# Patient Record
Sex: Male | Born: 1945 | Race: Black or African American | Hispanic: No | Marital: Single | Smoking: Current some day smoker
Health system: Southern US, Community
[De-identification: ages and names within clinical notes are randomized; demographics above are authoritative.]

## PROBLEM LIST (undated history)

## (undated) DIAGNOSIS — I10 Essential (primary) hypertension: Secondary | ICD-10-CM

## (undated) DIAGNOSIS — M65351 Trigger finger, right little finger: Secondary | ICD-10-CM

## (undated) DIAGNOSIS — I82409 Acute embolism and thrombosis of unspecified deep veins of unspecified lower extremity: Secondary | ICD-10-CM

## (undated) DIAGNOSIS — IMO0002 Reserved for concepts with insufficient information to code with codable children: Secondary | ICD-10-CM

## (undated) DIAGNOSIS — I872 Venous insufficiency (chronic) (peripheral): Secondary | ICD-10-CM

## (undated) DIAGNOSIS — K648 Other hemorrhoids: Secondary | ICD-10-CM

## (undated) DIAGNOSIS — K219 Gastro-esophageal reflux disease without esophagitis: Secondary | ICD-10-CM

## (undated) DIAGNOSIS — M1990 Unspecified osteoarthritis, unspecified site: Secondary | ICD-10-CM

## (undated) DIAGNOSIS — T402X5A Adverse effect of other opioids, initial encounter: Secondary | ICD-10-CM

## (undated) DIAGNOSIS — I350 Nonrheumatic aortic (valve) stenosis: Secondary | ICD-10-CM

## (undated) DIAGNOSIS — K220 Achalasia of cardia: Secondary | ICD-10-CM

## (undated) DIAGNOSIS — G629 Polyneuropathy, unspecified: Secondary | ICD-10-CM

## (undated) DIAGNOSIS — H269 Unspecified cataract: Secondary | ICD-10-CM

## (undated) DIAGNOSIS — B351 Tinea unguium: Secondary | ICD-10-CM

## (undated) DIAGNOSIS — K922 Gastrointestinal hemorrhage, unspecified: Secondary | ICD-10-CM

## (undated) DIAGNOSIS — E538 Deficiency of other specified B group vitamins: Secondary | ICD-10-CM

## (undated) DIAGNOSIS — D649 Anemia, unspecified: Secondary | ICD-10-CM

## (undated) DIAGNOSIS — K5790 Diverticulosis of intestine, part unspecified, without perforation or abscess without bleeding: Secondary | ICD-10-CM

## (undated) DIAGNOSIS — M48062 Spinal stenosis, lumbar region with neurogenic claudication: Secondary | ICD-10-CM

## (undated) DIAGNOSIS — K5903 Drug induced constipation: Secondary | ICD-10-CM

## (undated) DIAGNOSIS — G709 Myoneural disorder, unspecified: Secondary | ICD-10-CM

## (undated) DIAGNOSIS — Z800 Family history of malignant neoplasm of digestive organs: Secondary | ICD-10-CM

## (undated) DIAGNOSIS — K5520 Angiodysplasia of colon without hemorrhage: Secondary | ICD-10-CM

## (undated) DIAGNOSIS — I119 Hypertensive heart disease without heart failure: Secondary | ICD-10-CM

## (undated) DIAGNOSIS — H33001 Unspecified retinal detachment with retinal break, right eye: Secondary | ICD-10-CM

## (undated) DIAGNOSIS — E669 Obesity, unspecified: Secondary | ICD-10-CM

## (undated) HISTORY — DX: Nonrheumatic aortic (valve) stenosis: I35.0

## (undated) HISTORY — DX: Essential (primary) hypertension: I10

## (undated) HISTORY — DX: Trigger finger, right little finger: M65.351

## (undated) HISTORY — DX: Gastro-esophageal reflux disease without esophagitis: K21.9

## (undated) HISTORY — DX: Venous insufficiency (chronic) (peripheral): I87.2

## (undated) HISTORY — DX: Unspecified osteoarthritis, unspecified site: M19.90

## (undated) HISTORY — PX: OTHER SURGICAL HISTORY: SHX169

## (undated) HISTORY — DX: Angiodysplasia of colon without hemorrhage: K55.20

## (undated) HISTORY — DX: Drug induced constipation: K59.03

## (undated) HISTORY — DX: Obesity, unspecified: E66.9

## (undated) HISTORY — DX: Adverse effect of other opioids, initial encounter: T40.2X5A

## (undated) HISTORY — DX: Unspecified cataract: H26.9

## (undated) HISTORY — DX: Acute embolism and thrombosis of unspecified deep veins of unspecified lower extremity: I82.409

## (undated) HISTORY — DX: Family history of malignant neoplasm of digestive organs: Z80.0

## (undated) HISTORY — DX: Unspecified retinal detachment with retinal break, right eye: H33.001

## (undated) HISTORY — PX: FRACTURE SURGERY: SHX138

## (undated) HISTORY — DX: Other hemorrhoids: K64.8

## (undated) HISTORY — PX: EYE SURGERY: SHX253

## (undated) HISTORY — DX: Hypertensive heart disease without heart failure: I11.9

## (undated) HISTORY — DX: Spinal stenosis, lumbar region with neurogenic claudication: M48.062

## (undated) HISTORY — DX: Achalasia of cardia: K22.0

## (undated) HISTORY — DX: Deficiency of other specified B group vitamins: E53.8

## (undated) HISTORY — DX: Reserved for concepts with insufficient information to code with codable children: IMO0002

## (undated) HISTORY — DX: Diverticulosis of intestine, part unspecified, without perforation or abscess without bleeding: K57.90

## (undated) HISTORY — DX: Tinea unguium: B35.1

---

## 1997-11-30 ENCOUNTER — Emergency Department (HOSPITAL_COMMUNITY): Admission: EM | Admit: 1997-11-30 | Discharge: 1997-11-30 | Payer: Self-pay | Admitting: Emergency Medicine

## 1997-12-15 ENCOUNTER — Emergency Department (HOSPITAL_COMMUNITY): Admission: EM | Admit: 1997-12-15 | Discharge: 1997-12-15 | Payer: Self-pay | Admitting: Emergency Medicine

## 1998-10-11 ENCOUNTER — Emergency Department (HOSPITAL_COMMUNITY): Admission: EM | Admit: 1998-10-11 | Discharge: 1998-10-11 | Payer: Self-pay | Admitting: Emergency Medicine

## 1998-10-11 ENCOUNTER — Encounter: Payer: Self-pay | Admitting: Emergency Medicine

## 1999-04-18 ENCOUNTER — Encounter: Payer: Self-pay | Admitting: Emergency Medicine

## 1999-04-18 ENCOUNTER — Inpatient Hospital Stay (HOSPITAL_COMMUNITY): Admission: EM | Admit: 1999-04-18 | Discharge: 1999-04-21 | Payer: Self-pay | Admitting: Emergency Medicine

## 1999-04-19 ENCOUNTER — Encounter: Payer: Self-pay | Admitting: Internal Medicine

## 1999-05-05 ENCOUNTER — Encounter: Admission: RE | Admit: 1999-05-05 | Discharge: 1999-05-05 | Payer: Self-pay | Admitting: Internal Medicine

## 1999-06-02 ENCOUNTER — Encounter: Admission: RE | Admit: 1999-06-02 | Discharge: 1999-06-02 | Payer: Self-pay | Admitting: Hematology and Oncology

## 1999-11-17 ENCOUNTER — Encounter: Admission: RE | Admit: 1999-11-17 | Discharge: 1999-11-17 | Payer: Self-pay | Admitting: Internal Medicine

## 2000-04-05 ENCOUNTER — Emergency Department (HOSPITAL_COMMUNITY): Admission: EM | Admit: 2000-04-05 | Discharge: 2000-04-05 | Payer: Self-pay | Admitting: Emergency Medicine

## 2000-04-05 ENCOUNTER — Encounter: Payer: Self-pay | Admitting: Emergency Medicine

## 2000-07-10 ENCOUNTER — Emergency Department (HOSPITAL_COMMUNITY): Admission: EM | Admit: 2000-07-10 | Discharge: 2000-07-10 | Payer: Self-pay | Admitting: Emergency Medicine

## 2000-07-10 ENCOUNTER — Encounter: Payer: Self-pay | Admitting: Emergency Medicine

## 2000-10-29 ENCOUNTER — Emergency Department (HOSPITAL_COMMUNITY): Admission: EM | Admit: 2000-10-29 | Discharge: 2000-10-29 | Payer: Self-pay | Admitting: Emergency Medicine

## 2000-10-29 ENCOUNTER — Encounter: Payer: Self-pay | Admitting: Emergency Medicine

## 2001-10-03 ENCOUNTER — Inpatient Hospital Stay (HOSPITAL_COMMUNITY): Admission: EM | Admit: 2001-10-03 | Discharge: 2001-10-08 | Payer: Self-pay

## 2001-10-03 ENCOUNTER — Encounter: Payer: Self-pay | Admitting: Internal Medicine

## 2001-10-05 ENCOUNTER — Encounter: Payer: Self-pay | Admitting: Internal Medicine

## 2001-10-14 ENCOUNTER — Encounter: Admission: RE | Admit: 2001-10-14 | Discharge: 2001-10-14 | Payer: Self-pay | Admitting: Internal Medicine

## 2002-10-23 ENCOUNTER — Ambulatory Visit (HOSPITAL_COMMUNITY): Admission: RE | Admit: 2002-10-23 | Discharge: 2002-10-23 | Payer: Self-pay | Admitting: Internal Medicine

## 2002-10-23 ENCOUNTER — Encounter: Admission: RE | Admit: 2002-10-23 | Discharge: 2002-10-23 | Payer: Self-pay | Admitting: Internal Medicine

## 2002-10-23 ENCOUNTER — Encounter: Payer: Self-pay | Admitting: Internal Medicine

## 2002-11-05 ENCOUNTER — Encounter: Payer: Self-pay | Admitting: Cardiology

## 2002-11-05 ENCOUNTER — Ambulatory Visit (HOSPITAL_COMMUNITY): Admission: RE | Admit: 2002-11-05 | Discharge: 2002-11-05 | Payer: Self-pay | Admitting: Internal Medicine

## 2002-11-13 ENCOUNTER — Encounter: Admission: RE | Admit: 2002-11-13 | Discharge: 2002-11-13 | Payer: Self-pay | Admitting: Internal Medicine

## 2003-02-09 ENCOUNTER — Encounter: Admission: RE | Admit: 2003-02-09 | Discharge: 2003-02-09 | Payer: Self-pay | Admitting: Internal Medicine

## 2003-06-30 ENCOUNTER — Encounter: Admission: RE | Admit: 2003-06-30 | Discharge: 2003-06-30 | Payer: Self-pay | Admitting: Internal Medicine

## 2003-08-10 ENCOUNTER — Encounter: Admission: RE | Admit: 2003-08-10 | Discharge: 2003-08-10 | Payer: Self-pay | Admitting: Internal Medicine

## 2004-03-14 ENCOUNTER — Emergency Department (HOSPITAL_COMMUNITY): Admission: EM | Admit: 2004-03-14 | Discharge: 2004-03-14 | Payer: Self-pay | Admitting: Family Medicine

## 2004-06-28 ENCOUNTER — Ambulatory Visit: Payer: Self-pay | Admitting: Internal Medicine

## 2004-08-04 ENCOUNTER — Ambulatory Visit: Payer: Self-pay | Admitting: Internal Medicine

## 2004-08-15 ENCOUNTER — Encounter: Admission: RE | Admit: 2004-08-15 | Discharge: 2004-10-20 | Payer: Self-pay | Admitting: Internal Medicine

## 2004-08-15 ENCOUNTER — Ambulatory Visit: Payer: Self-pay | Admitting: Internal Medicine

## 2004-08-29 ENCOUNTER — Ambulatory Visit: Payer: Self-pay | Admitting: Internal Medicine

## 2004-09-16 ENCOUNTER — Ambulatory Visit: Payer: Self-pay | Admitting: Internal Medicine

## 2004-12-06 ENCOUNTER — Ambulatory Visit: Payer: Self-pay | Admitting: Internal Medicine

## 2004-12-10 ENCOUNTER — Ambulatory Visit (HOSPITAL_COMMUNITY): Admission: RE | Admit: 2004-12-10 | Discharge: 2004-12-10 | Payer: Self-pay | Admitting: Internal Medicine

## 2006-02-27 ENCOUNTER — Ambulatory Visit: Payer: Self-pay | Admitting: Hospitalist

## 2006-03-12 ENCOUNTER — Ambulatory Visit: Payer: Self-pay | Admitting: Hospitalist

## 2006-03-13 ENCOUNTER — Ambulatory Visit (HOSPITAL_COMMUNITY): Admission: RE | Admit: 2006-03-13 | Discharge: 2006-03-13 | Payer: Self-pay | Admitting: Hospitalist

## 2006-03-13 ENCOUNTER — Encounter (INDEPENDENT_AMBULATORY_CARE_PROVIDER_SITE_OTHER): Payer: Self-pay | Admitting: Cardiology

## 2006-03-14 ENCOUNTER — Ambulatory Visit: Payer: Self-pay | Admitting: Hospitalist

## 2006-06-15 DIAGNOSIS — I10 Essential (primary) hypertension: Secondary | ICD-10-CM | POA: Insufficient documentation

## 2006-06-15 HISTORY — DX: Essential (primary) hypertension: I10

## 2006-06-16 DIAGNOSIS — M48062 Spinal stenosis, lumbar region with neurogenic claudication: Secondary | ICD-10-CM

## 2006-06-16 HISTORY — DX: Spinal stenosis, lumbar region with neurogenic claudication: M48.062

## 2006-10-09 ENCOUNTER — Encounter (INDEPENDENT_AMBULATORY_CARE_PROVIDER_SITE_OTHER): Payer: Self-pay | Admitting: Internal Medicine

## 2006-10-09 ENCOUNTER — Ambulatory Visit: Payer: Self-pay | Admitting: Hospitalist

## 2006-10-09 DIAGNOSIS — I350 Nonrheumatic aortic (valve) stenosis: Secondary | ICD-10-CM | POA: Insufficient documentation

## 2006-10-09 HISTORY — DX: Nonrheumatic aortic (valve) stenosis: I35.0

## 2006-10-09 LAB — CONVERTED CEMR LAB
ALT: 23 units/L (ref 0–53)
AST: 24 units/L (ref 0–37)
Alkaline Phosphatase: 50 units/L (ref 39–117)
CO2: 27 meq/L (ref 19–32)
Creatinine, Ser: 0.89 mg/dL (ref 0.40–1.50)
Sodium: 139 meq/L (ref 135–145)
Total Bilirubin: 0.7 mg/dL (ref 0.3–1.2)

## 2006-10-11 ENCOUNTER — Ambulatory Visit: Payer: Self-pay | Admitting: Cardiovascular Disease

## 2006-10-16 ENCOUNTER — Ambulatory Visit: Payer: Self-pay | Admitting: Hospitalist

## 2006-10-16 ENCOUNTER — Encounter (INDEPENDENT_AMBULATORY_CARE_PROVIDER_SITE_OTHER): Payer: Self-pay | Admitting: Internal Medicine

## 2006-10-16 LAB — CONVERTED CEMR LAB
BUN: 13 mg/dL
CO2: 30 meq/L
Calcium: 9.8 mg/dL
Chloride: 97 meq/L
Creatinine, Ser: 1.13 mg/dL
Glucose, Bld: 98 mg/dL
Potassium: 3.8 meq/L
Sodium: 135 meq/L

## 2006-10-22 ENCOUNTER — Ambulatory Visit: Payer: Self-pay

## 2006-10-22 ENCOUNTER — Telehealth (INDEPENDENT_AMBULATORY_CARE_PROVIDER_SITE_OTHER): Payer: Self-pay | Admitting: Internal Medicine

## 2006-12-10 ENCOUNTER — Encounter: Admission: RE | Admit: 2006-12-10 | Discharge: 2006-12-10 | Payer: Self-pay | Admitting: Neurosurgery

## 2006-12-24 ENCOUNTER — Encounter: Admission: RE | Admit: 2006-12-24 | Discharge: 2006-12-24 | Payer: Self-pay | Admitting: Neurosurgery

## 2007-01-09 ENCOUNTER — Inpatient Hospital Stay (HOSPITAL_COMMUNITY): Admission: EM | Admit: 2007-01-09 | Discharge: 2007-01-10 | Payer: Self-pay | Admitting: Emergency Medicine

## 2007-01-09 ENCOUNTER — Ambulatory Visit: Payer: Self-pay | Admitting: Cardiology

## 2007-01-09 ENCOUNTER — Ambulatory Visit: Payer: Self-pay | Admitting: Internal Medicine

## 2007-01-10 ENCOUNTER — Encounter (INDEPENDENT_AMBULATORY_CARE_PROVIDER_SITE_OTHER): Payer: Self-pay | Admitting: Internal Medicine

## 2007-01-21 ENCOUNTER — Telehealth (INDEPENDENT_AMBULATORY_CARE_PROVIDER_SITE_OTHER): Payer: Self-pay | Admitting: Pharmacy Technician

## 2007-02-04 DIAGNOSIS — K219 Gastro-esophageal reflux disease without esophagitis: Secondary | ICD-10-CM

## 2007-02-04 HISTORY — DX: Gastro-esophageal reflux disease without esophagitis: K21.9

## 2007-02-11 ENCOUNTER — Ambulatory Visit: Payer: Self-pay | Admitting: Internal Medicine

## 2007-02-12 ENCOUNTER — Encounter (INDEPENDENT_AMBULATORY_CARE_PROVIDER_SITE_OTHER): Payer: Self-pay | Admitting: Infectious Diseases

## 2007-02-12 LAB — CONVERTED CEMR LAB: Sodium: 142 meq/L (ref 135–145)

## 2007-02-13 DIAGNOSIS — F17200 Nicotine dependence, unspecified, uncomplicated: Secondary | ICD-10-CM

## 2007-04-16 ENCOUNTER — Telehealth (INDEPENDENT_AMBULATORY_CARE_PROVIDER_SITE_OTHER): Payer: Self-pay | Admitting: *Deleted

## 2007-07-01 ENCOUNTER — Encounter (INDEPENDENT_AMBULATORY_CARE_PROVIDER_SITE_OTHER): Payer: Self-pay | Admitting: *Deleted

## 2007-07-01 ENCOUNTER — Ambulatory Visit: Payer: Self-pay | Admitting: Internal Medicine

## 2007-07-02 ENCOUNTER — Encounter (INDEPENDENT_AMBULATORY_CARE_PROVIDER_SITE_OTHER): Payer: Self-pay | Admitting: *Deleted

## 2007-07-02 LAB — CONVERTED CEMR LAB
AST: 27 units/L (ref 0–37)
Alkaline Phosphatase: 57 units/L (ref 39–117)
CO2: 26 meq/L (ref 19–32)
Creatinine, Ser: 1.12 mg/dL (ref 0.40–1.50)
Glucose, Bld: 74 mg/dL (ref 70–99)
LDL Cholesterol: 94 mg/dL (ref 0–99)
Potassium: 4.2 meq/L (ref 3.5–5.3)
Sodium: 143 meq/L (ref 135–145)
Total Bilirubin: 0.6 mg/dL (ref 0.3–1.2)
Total Protein: 7.3 g/dL (ref 6.0–8.3)
VLDL: 35 mg/dL (ref 0–40)

## 2007-08-14 ENCOUNTER — Ambulatory Visit: Payer: Self-pay | Admitting: Internal Medicine

## 2007-08-14 ENCOUNTER — Encounter (INDEPENDENT_AMBULATORY_CARE_PROVIDER_SITE_OTHER): Payer: Self-pay | Admitting: *Deleted

## 2007-09-23 ENCOUNTER — Telehealth: Payer: Self-pay | Admitting: *Deleted

## 2007-11-06 ENCOUNTER — Encounter (INDEPENDENT_AMBULATORY_CARE_PROVIDER_SITE_OTHER): Payer: Self-pay | Admitting: *Deleted

## 2007-11-06 ENCOUNTER — Ambulatory Visit: Payer: Self-pay | Admitting: Internal Medicine

## 2007-11-06 LAB — CONVERTED CEMR LAB
Amphetamine Screen, Ur: NEGATIVE
Barbiturate Quant, Ur: NEGATIVE
Benzodiazepines.: NEGATIVE
Cocaine Metabolites: NEGATIVE
Marijuana Metabolite: NEGATIVE

## 2007-12-05 ENCOUNTER — Ambulatory Visit: Payer: Self-pay | Admitting: Internal Medicine

## 2007-12-05 ENCOUNTER — Ambulatory Visit: Payer: Self-pay | Admitting: Infectious Disease

## 2007-12-05 ENCOUNTER — Encounter (INDEPENDENT_AMBULATORY_CARE_PROVIDER_SITE_OTHER): Payer: Self-pay | Admitting: *Deleted

## 2007-12-05 ENCOUNTER — Encounter (INDEPENDENT_AMBULATORY_CARE_PROVIDER_SITE_OTHER): Payer: Self-pay | Admitting: Internal Medicine

## 2007-12-05 ENCOUNTER — Inpatient Hospital Stay (HOSPITAL_COMMUNITY): Admission: AD | Admit: 2007-12-05 | Discharge: 2007-12-07 | Payer: Self-pay | Admitting: Infectious Disease

## 2007-12-05 LAB — CONVERTED CEMR LAB
BUN: 14 mg/dL (ref 6–23)
CO2: 25 meq/L (ref 19–32)
Microalb, Ur: 4.91 mg/dL — ABNORMAL HIGH (ref 0.00–1.89)

## 2007-12-06 ENCOUNTER — Ambulatory Visit: Payer: Self-pay | Admitting: Vascular Surgery

## 2007-12-06 ENCOUNTER — Encounter: Payer: Self-pay | Admitting: Infectious Disease

## 2007-12-13 ENCOUNTER — Encounter: Payer: Self-pay | Admitting: Infectious Disease

## 2007-12-17 ENCOUNTER — Ambulatory Visit: Payer: Self-pay

## 2007-12-17 ENCOUNTER — Encounter: Payer: Self-pay | Admitting: Cardiovascular Disease

## 2007-12-17 ENCOUNTER — Ambulatory Visit: Payer: Self-pay | Admitting: Cardiovascular Disease

## 2007-12-26 ENCOUNTER — Telehealth: Payer: Self-pay | Admitting: *Deleted

## 2008-01-20 ENCOUNTER — Ambulatory Visit: Payer: Self-pay | Admitting: Internal Medicine

## 2008-01-22 ENCOUNTER — Ambulatory Visit (HOSPITAL_COMMUNITY): Admission: RE | Admit: 2008-01-22 | Discharge: 2008-01-22 | Payer: Self-pay | Admitting: Gastroenterology

## 2008-02-11 ENCOUNTER — Ambulatory Visit (HOSPITAL_COMMUNITY): Admission: RE | Admit: 2008-02-11 | Discharge: 2008-02-11 | Payer: Self-pay | Admitting: Physical Therapy

## 2008-02-26 ENCOUNTER — Ambulatory Visit: Payer: Self-pay | Admitting: Internal Medicine

## 2008-02-27 ENCOUNTER — Telehealth: Payer: Self-pay | Admitting: *Deleted

## 2008-03-10 ENCOUNTER — Encounter: Payer: Self-pay | Admitting: Gastroenterology

## 2008-03-11 ENCOUNTER — Ambulatory Visit: Payer: Self-pay | Admitting: Gastroenterology

## 2008-03-16 ENCOUNTER — Telehealth: Payer: Self-pay | Admitting: Internal Medicine

## 2008-03-24 ENCOUNTER — Encounter: Payer: Self-pay | Admitting: Gastroenterology

## 2008-03-24 ENCOUNTER — Ambulatory Visit: Payer: Self-pay | Admitting: Gastroenterology

## 2008-03-27 ENCOUNTER — Encounter: Payer: Self-pay | Admitting: Gastroenterology

## 2008-05-04 ENCOUNTER — Encounter: Payer: Self-pay | Admitting: Internal Medicine

## 2008-05-04 ENCOUNTER — Ambulatory Visit: Payer: Self-pay | Admitting: *Deleted

## 2008-05-04 DIAGNOSIS — E785 Hyperlipidemia, unspecified: Secondary | ICD-10-CM

## 2008-05-04 LAB — CONVERTED CEMR LAB
ALT: 29 units/L (ref 0–53)
AST: 23 units/L (ref 0–37)
BUN: 11 mg/dL (ref 6–23)
Blood Glucose, Fingerstick: 95
Calcium: 9.6 mg/dL (ref 8.4–10.5)
Chloride: 101 meq/L (ref 96–112)
Cholesterol: 201 mg/dL — ABNORMAL HIGH (ref 0–200)
Glucose, Bld: 94 mg/dL (ref 70–99)
HDL: 69 mg/dL (ref >39)
Hemoglobin: 15.2 g/dL (ref 13.0–17.0)
LDL Cholesterol: 99 mg/dL (ref 0–99)
MCHC: 34.9 g/dL (ref 30.0–36.0)
MCV: 94.4 fL (ref 78.0–100.0)
Platelets: 228 10*3/uL (ref 150–400)
RBC: 4.62 M/uL (ref 4.22–5.81)
RDW: 14.6 % (ref 11.5–15.5)
Sodium: 141 meq/L (ref 135–145)
Total Bilirubin: 0.8 mg/dL (ref 0.3–1.2)

## 2008-05-06 ENCOUNTER — Encounter: Payer: Self-pay | Admitting: Internal Medicine

## 2008-05-12 ENCOUNTER — Ambulatory Visit: Payer: Self-pay | Admitting: Vascular Surgery

## 2008-05-12 ENCOUNTER — Encounter: Payer: Self-pay | Admitting: Internal Medicine

## 2008-05-12 ENCOUNTER — Ambulatory Visit: Admission: RE | Admit: 2008-05-12 | Discharge: 2008-05-12 | Payer: Self-pay | Admitting: Internal Medicine

## 2008-06-04 ENCOUNTER — Telehealth: Payer: Self-pay | Admitting: *Deleted

## 2008-06-05 IMAGING — CR DG CHEST 1V PORT
1 series · 1 of 1 positions shown · non-contrast
Comparison: Report [id number].

Portable semierect AP chest 01/09/2007, [DATE] hours.
INDICATION: Chest pain.

[view not recorded]
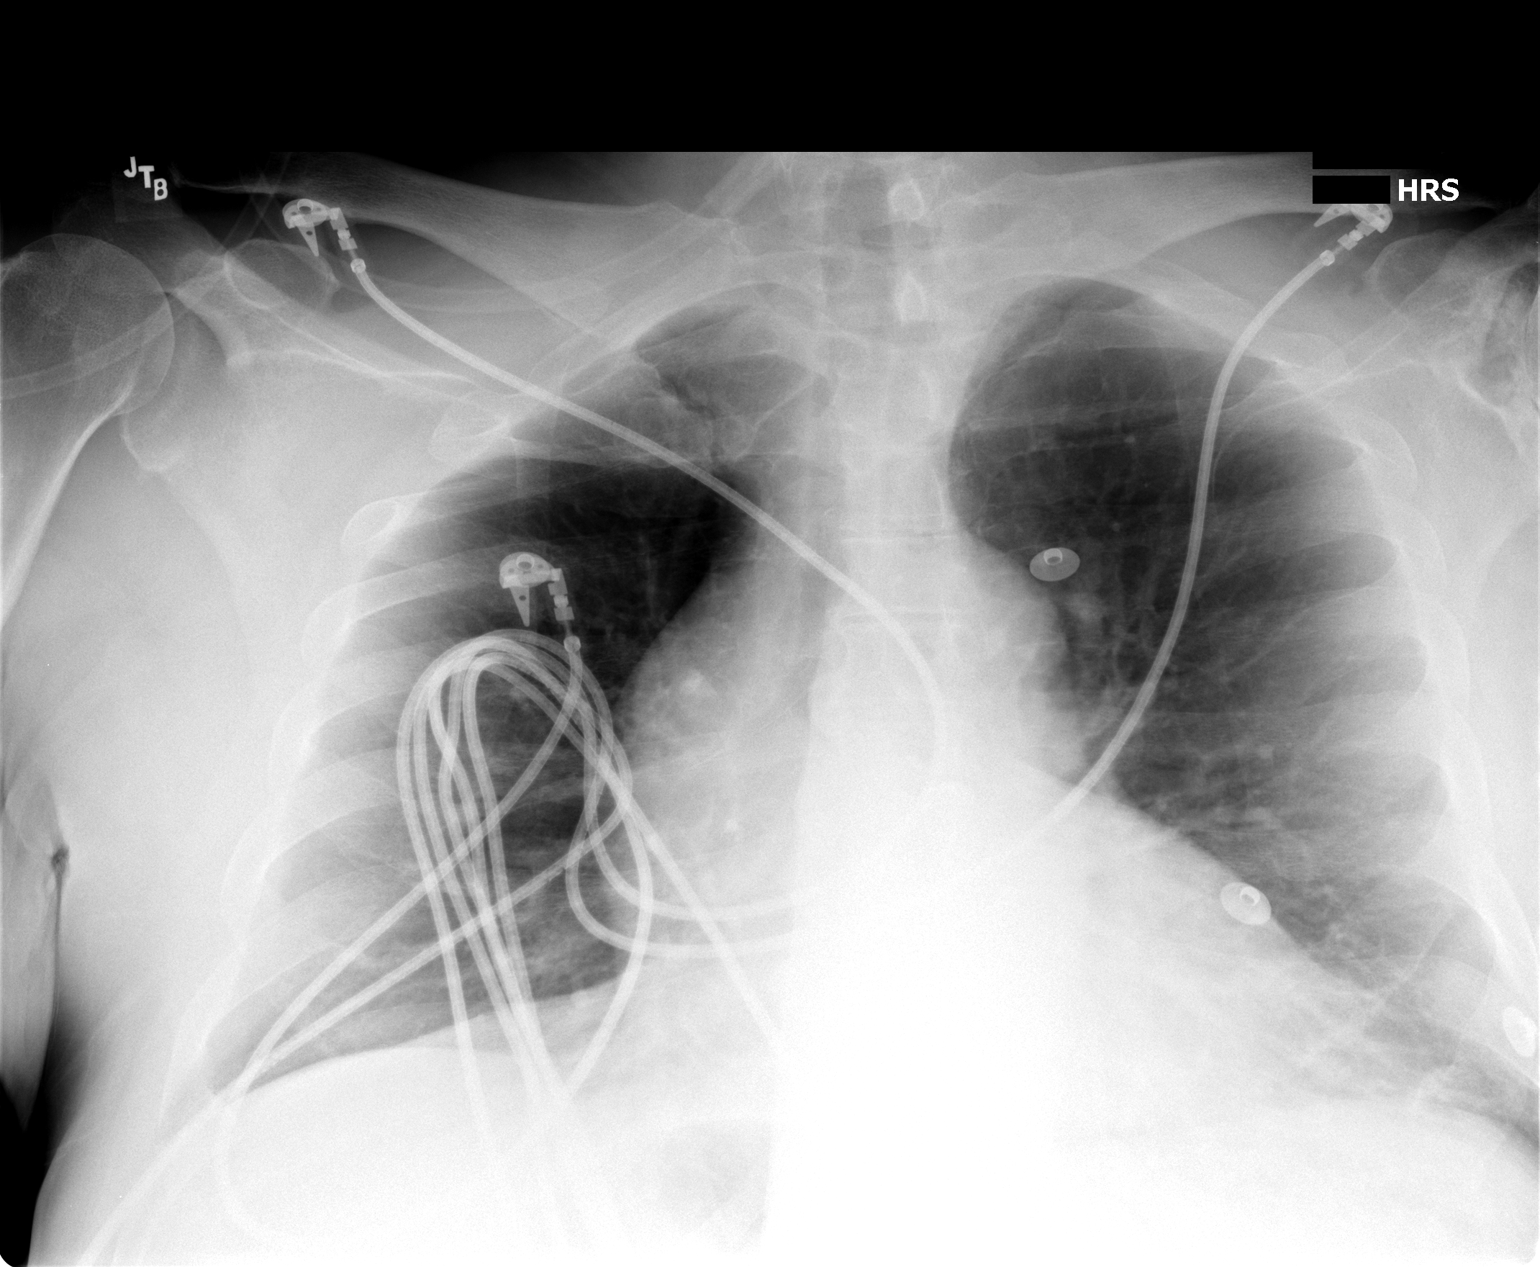

[1 of 1 positions shown; findings below may reference images not displayed]

FINDINGS: Patient is rotated to the right. This accentuates the size of the
heart of the right cardiomediastinal contour. There is probably some ectasia of
the ascending aorta, as can be seen in aortic valvular disease or hypertension.
Film was also obtained in apical lordotic projection. Left basilar atelectasis.
No edema, effusions, pneumothorax, or airspace disease. Bones appear within
normal limits.
IMPRESSION: 1. No active cardiopulmonary disease.

## 2008-07-06 ENCOUNTER — Encounter: Payer: Self-pay | Admitting: Internal Medicine

## 2008-07-06 ENCOUNTER — Ambulatory Visit: Payer: Self-pay | Admitting: Internal Medicine

## 2008-08-05 ENCOUNTER — Ambulatory Visit (HOSPITAL_COMMUNITY): Admission: RE | Admit: 2008-08-05 | Discharge: 2008-08-05 | Payer: Self-pay | Admitting: Neurosurgery

## 2008-08-06 ENCOUNTER — Ambulatory Visit: Payer: Self-pay | Admitting: Internal Medicine

## 2008-08-13 ENCOUNTER — Telehealth: Payer: Self-pay | Admitting: *Deleted

## 2008-08-24 ENCOUNTER — Ambulatory Visit (HOSPITAL_COMMUNITY): Admission: RE | Admit: 2008-08-24 | Discharge: 2008-08-24 | Payer: Self-pay | Admitting: Internal Medicine

## 2008-09-07 ENCOUNTER — Ambulatory Visit: Payer: Self-pay | Admitting: Internal Medicine

## 2008-09-07 ENCOUNTER — Encounter (INDEPENDENT_AMBULATORY_CARE_PROVIDER_SITE_OTHER): Payer: Self-pay | Admitting: Internal Medicine

## 2008-09-07 LAB — CONVERTED CEMR LAB
BUN: 13 mg/dL (ref 6–23)
CO2: 24 meq/L (ref 19–32)
Chloride: 101 meq/L (ref 96–112)
Creatinine, Urine: 288.6 mg/dL
Glucose, Bld: 72 mg/dL (ref 70–99)
Hemoglobin, Urine: NEGATIVE
Protein, ur: NEGATIVE mg/dL
Sodium: 139 meq/L (ref 135–145)
Specific Gravity, Urine: 1.029 (ref 1.005–1.030)
Urine Glucose: NEGATIVE mg/dL
Urobilinogen, UA: 1 (ref 0.0–1.0)

## 2008-09-28 ENCOUNTER — Ambulatory Visit (HOSPITAL_COMMUNITY): Admission: RE | Admit: 2008-09-28 | Discharge: 2008-09-28 | Payer: Self-pay | Admitting: Neurosurgery

## 2008-12-23 ENCOUNTER — Telehealth: Payer: Self-pay | Admitting: Internal Medicine

## 2009-02-15 ENCOUNTER — Telehealth: Payer: Self-pay | Admitting: Internal Medicine

## 2009-02-19 ENCOUNTER — Ambulatory Visit: Payer: Self-pay | Admitting: Internal Medicine

## 2009-02-19 DIAGNOSIS — R519 Headache, unspecified: Secondary | ICD-10-CM | POA: Insufficient documentation

## 2009-02-19 DIAGNOSIS — R51 Headache: Secondary | ICD-10-CM

## 2009-02-22 ENCOUNTER — Encounter: Payer: Self-pay | Admitting: Internal Medicine

## 2009-03-19 ENCOUNTER — Ambulatory Visit: Payer: Self-pay | Admitting: Internal Medicine

## 2009-03-19 ENCOUNTER — Encounter: Payer: Self-pay | Admitting: Internal Medicine

## 2009-03-22 ENCOUNTER — Encounter: Payer: Self-pay | Admitting: Internal Medicine

## 2009-04-16 ENCOUNTER — Ambulatory Visit: Payer: Self-pay | Admitting: Internal Medicine

## 2009-04-17 ENCOUNTER — Encounter: Payer: Self-pay | Admitting: Internal Medicine

## 2009-04-17 LAB — CONVERTED CEMR LAB
ALT: 15 units/L (ref 0–53)
Alkaline Phosphatase: 45 units/L (ref 39–117)
BUN: 11 mg/dL (ref 6–23)
Calcium: 9.8 mg/dL (ref 8.4–10.5)
Creatinine, Ser: 1.1 mg/dL (ref 0.40–1.50)
Glucose, Bld: 77 mg/dL (ref 70–99)
HDL: 46 mg/dL (ref >39)
Potassium: 4.5 meq/L (ref 3.5–5.3)
Sodium: 138 meq/L (ref 135–145)

## 2009-04-22 ENCOUNTER — Telehealth: Payer: Self-pay | Admitting: Licensed Clinical Social Worker

## 2009-06-09 ENCOUNTER — Telehealth: Payer: Self-pay | Admitting: Internal Medicine

## 2009-07-16 ENCOUNTER — Encounter: Payer: Self-pay | Admitting: Internal Medicine

## 2009-07-23 ENCOUNTER — Ambulatory Visit: Payer: Self-pay | Admitting: Internal Medicine

## 2009-09-07 ENCOUNTER — Ambulatory Visit: Payer: Self-pay | Admitting: Infectious Diseases

## 2009-09-28 ENCOUNTER — Ambulatory Visit (HOSPITAL_COMMUNITY): Admission: RE | Admit: 2009-09-28 | Discharge: 2009-09-28 | Payer: Self-pay | Admitting: Internal Medicine

## 2009-09-28 ENCOUNTER — Ambulatory Visit: Payer: Self-pay | Admitting: Internal Medicine

## 2009-09-30 ENCOUNTER — Telehealth: Payer: Self-pay | Admitting: Internal Medicine

## 2009-10-27 ENCOUNTER — Ambulatory Visit: Payer: Self-pay | Admitting: Internal Medicine

## 2009-10-27 ENCOUNTER — Ambulatory Visit (HOSPITAL_COMMUNITY): Admission: RE | Admit: 2009-10-27 | Discharge: 2009-10-27 | Payer: Self-pay | Admitting: Internal Medicine

## 2009-11-25 ENCOUNTER — Ambulatory Visit: Payer: Self-pay | Admitting: Internal Medicine

## 2009-11-25 DIAGNOSIS — M1711 Unilateral primary osteoarthritis, right knee: Secondary | ICD-10-CM

## 2009-11-25 DIAGNOSIS — M1990 Unspecified osteoarthritis, unspecified site: Secondary | ICD-10-CM

## 2009-11-25 HISTORY — DX: Unspecified osteoarthritis, unspecified site: M19.90

## 2009-12-01 ENCOUNTER — Ambulatory Visit: Payer: Self-pay | Admitting: Sports Medicine

## 2009-12-01 ENCOUNTER — Encounter: Payer: Self-pay | Admitting: Family Medicine

## 2009-12-09 ENCOUNTER — Encounter (INDEPENDENT_AMBULATORY_CARE_PROVIDER_SITE_OTHER): Payer: Self-pay | Admitting: Internal Medicine

## 2009-12-09 ENCOUNTER — Encounter: Payer: Self-pay | Admitting: Internal Medicine

## 2009-12-09 ENCOUNTER — Ambulatory Visit: Payer: Self-pay | Admitting: Internal Medicine

## 2009-12-09 ENCOUNTER — Ambulatory Visit (HOSPITAL_COMMUNITY): Admission: RE | Admit: 2009-12-09 | Discharge: 2009-12-09 | Payer: Self-pay | Admitting: Internal Medicine

## 2009-12-09 LAB — CONVERTED CEMR LAB
ALT: 15 units/L (ref 0–53)
Alkaline Phosphatase: 51 units/L (ref 39–117)
CO2: 24 meq/L (ref 19–32)
Calcium: 9.4 mg/dL (ref 8.4–10.5)
Creatinine, Ser: 0.99 mg/dL (ref 0.40–1.50)
Glucose, Bld: 72 mg/dL (ref 70–99)
LDL Cholesterol: 91 mg/dL (ref 0–99)
Potassium: 4.3 meq/L (ref 3.5–5.3)
Sodium: 141 meq/L (ref 135–145)
TSH: 0.644 microintl units/mL (ref 0.350–4.5)
Total Bilirubin: 0.4 mg/dL (ref 0.3–1.2)
Total CHOL/HDL Ratio: 3

## 2009-12-29 ENCOUNTER — Telehealth: Payer: Self-pay | Admitting: Internal Medicine

## 2010-01-26 ENCOUNTER — Telehealth (INDEPENDENT_AMBULATORY_CARE_PROVIDER_SITE_OTHER): Payer: Self-pay | Admitting: *Deleted

## 2010-02-23 ENCOUNTER — Encounter: Payer: Self-pay | Admitting: Internal Medicine

## 2010-02-23 ENCOUNTER — Ambulatory Visit: Payer: Self-pay | Admitting: Internal Medicine

## 2010-03-22 ENCOUNTER — Telehealth: Payer: Self-pay | Admitting: Internal Medicine

## 2010-03-25 ENCOUNTER — Ambulatory Visit: Payer: Self-pay | Admitting: Internal Medicine

## 2010-03-29 ENCOUNTER — Encounter: Payer: Self-pay | Admitting: Internal Medicine

## 2010-03-29 ENCOUNTER — Telehealth: Payer: Self-pay | Admitting: Licensed Clinical Social Worker

## 2010-04-04 ENCOUNTER — Telehealth: Payer: Self-pay | Admitting: *Deleted

## 2010-04-08 ENCOUNTER — Ambulatory Visit: Payer: Self-pay | Admitting: Internal Medicine

## 2010-04-11 ENCOUNTER — Telehealth: Payer: Self-pay | Admitting: Licensed Clinical Social Worker

## 2010-04-25 ENCOUNTER — Telehealth (INDEPENDENT_AMBULATORY_CARE_PROVIDER_SITE_OTHER): Payer: Self-pay | Admitting: *Deleted

## 2010-04-25 ENCOUNTER — Encounter: Payer: Self-pay | Admitting: Internal Medicine

## 2010-05-06 ENCOUNTER — Encounter: Payer: Self-pay | Admitting: *Deleted

## 2010-05-06 ENCOUNTER — Emergency Department (HOSPITAL_COMMUNITY): Admission: EM | Admit: 2010-05-06 | Discharge: 2010-05-06 | Payer: Self-pay | Admitting: Emergency Medicine

## 2010-05-09 ENCOUNTER — Telehealth: Payer: Self-pay | Admitting: Licensed Clinical Social Worker

## 2010-05-10 ENCOUNTER — Encounter: Payer: Self-pay | Admitting: Licensed Clinical Social Worker

## 2010-05-13 ENCOUNTER — Telehealth: Payer: Self-pay | Admitting: Licensed Clinical Social Worker

## 2010-05-17 ENCOUNTER — Encounter: Payer: Self-pay | Admitting: Internal Medicine

## 2010-06-03 ENCOUNTER — Ambulatory Visit: Payer: Self-pay | Admitting: Internal Medicine

## 2010-06-22 ENCOUNTER — Telehealth: Payer: Self-pay | Admitting: *Deleted

## 2010-07-04 ENCOUNTER — Telehealth: Payer: Self-pay | Admitting: Internal Medicine

## 2010-07-08 ENCOUNTER — Encounter: Payer: Self-pay | Admitting: Internal Medicine

## 2010-07-13 ENCOUNTER — Encounter: Payer: Self-pay | Admitting: Internal Medicine

## 2010-07-17 ENCOUNTER — Encounter: Payer: Self-pay | Admitting: Cardiovascular Disease

## 2010-07-17 ENCOUNTER — Encounter: Payer: Self-pay | Admitting: Neurosurgery

## 2010-07-20 ENCOUNTER — Encounter: Payer: Self-pay | Admitting: *Deleted

## 2010-07-28 NOTE — Progress Notes (Signed)
Summary: pain med/ hla  Phone Note Call from Patient   Summary of Call: the care coordinator from the facility that pt lives at calls to say pt could not get oxycontin filled, pharm states pt got #120 vicodin on 9/28, pt states he has been taking 2 vicodin daily and bottle is now empty, the cc is informed that pt must file police report since a large portion according to pt and cc are gone, appt is made for fri 10/14 and pt is instructed to bring all meds and empty bottle of vicodin along w/ script not filled a state query report was obtained for pt and it does not show that the vicodin has been filled since 9/1 and only for 30, i called the pharm and spoke w/ the pharm manager that does confirm they filled the vicodin 9/28 but it was not picked up. she also states if he will bring the oxycontin script in they will look at it again and call the clinic if there is a problem i tried to call the facility again and got vmail, will call tomorrow am Initial call taken by: Marin Roberts RN,  April 04, 2010 4:24 PM  Follow-up for Phone Call        spoke w/ facility, pt will pick vicodin up today Follow-up by: Marin Roberts RN,  April 05, 2010 5:54 PM

## 2010-07-28 NOTE — Progress Notes (Signed)
  Phone Note Outgoing Call   Call placed by: Theotis Barrio NT II,  June 22, 2010 2:53 PM Call placed to: Specialist Details for Reason: FOLLOW UP ON REFERRAL Summary of Call: SPOKE WITH KESHIA AT [facility]. SHE STATED THAT SHE HAS TRIED TO COTACT PATIENT, HE HAS NOT RETURNED HER CALL/ THIS IS THE SECOND UPDATED REFERRAL I'VE SENT TO REHAB, BEING OUTDATED.  KEISHA WILL TRY AGAIN TO CONTACT THIS PATIENT.  I WILL HOLD ON TO THIS REFERRAL.

## 2010-07-28 NOTE — Medication Information (Signed)
Summary: OXYCODONE  OXYCODONE   Imported By: Margie Billet 07/19/2010 10:57:52  _____________________________________________________________________  External Attachment:    Type:   Image     Comment:   External Document

## 2010-07-28 NOTE — Progress Notes (Signed)
  Phone Note Outgoing Call   Summary of Call: No answer at patient's phone number.  I would like to discuss ordering an aide for him in his home.    Follow-up for Phone Call        No answer at his home phone number.  Dorothe Pea  May 09, 2010 12:46 PM   Additional Follow-up for Phone Call Additional follow up Details #1::        No answer at home phone,  sent letter to his home to call SW.  Dorothe Pea  May 10, 2010 12:08 PM

## 2010-07-28 NOTE — Progress Notes (Signed)
Summary: change avelox/ hla  Phone Note Call from Patient   Summary of Call: pt calls and states he cannot afford avelox could you please prescribe something else? Initial call taken by: Marin Roberts RN,  September 30, 2009 2:49 PM  Follow-up for Phone Call        changed to doxycycline Follow-up by: Clerance Lav MD,  September 30, 2009 2:50 PM    New/Updated Medications: DOXYCYCLINE HYCLATE 100 MG TABS (DOXYCYCLINE HYCLATE) Take 1 tablet by mouth two times a day Prescriptions: DOXYCYCLINE HYCLATE 100 MG TABS (DOXYCYCLINE HYCLATE) Take 1 tablet by mouth two times a day  #20 x 0   Entered and Authorized by:   Clerance Lav MD   Signed by:   Clerance Lav MD on 09/30/2009   Method used:   [facility]  [address] [phone]       [address]       [place], [place]  [id number]       Ph: [phone] or [phone]       Fax: [phone]   RxID:   [id number]

## 2010-07-28 NOTE — Letter (Signed)
Summary: [facility] PT referral form  [facility] PT referral form   Imported By: Marily Memos 12/01/2009 13:49:05  _____________________________________________________________________  External Attachment:    Type:   Image     Comment:   External Document

## 2010-07-28 NOTE — Assessment & Plan Note (Signed)
Summary: cold/abd pain/gg   Vital Signs:  Patient profile:   65 year old male Height:      74 inches Weight:      281.7 pounds BMI:     36.30 Temp:     97.8 degrees F Pulse rate:   87 / minute BP sitting:   139 / 78  (right arm)  Vitals Entered By: Filomena Jungling NT II (September 28, 2009 10:21 AM) CC: COLD X 1 WEEK AND NECK AND SIDE PAIN Is Patient Diabetic? No Pain Assessment Patient in pain? yes     Location: LEFT SIDE , HEADACHE  Intensity: 9 Type: aching Onset of pain  2 WEEK Nutritional Status BMI of > 30 = obese  Does patient need assistance? Functional Status Self care Ambulation Normal   Primary Care Provider:  Clerance Lav MD  CC:  COLD X 1 WEEK AND NECK AND SIDE PAIN.  History of Present Illness: 66 yrs old male Past Medical History: HTN Chronic back pain-- MRI with  L2-3 degenerative chages, disc bulge and mild facet & ligamentous hypertrophy combine mild multifactorial stenosis. 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 Mild Aortic stenosis - aortic valve area 1.53cm sq 9/07 from 1.71cmsq 2004 Mild dilated left atrium Tobacco abuse prior admits for chest pain.  Chronic chest pain    negative cath 2003    negative stress myoview 2008    Unchanged echo 7/07 with EF 65%      comes in for continued follow up. He is having pain in right rib cage. The pain is constant and present at rest and gets worsened with coughing. He is having cough and cold for last 4-5 days. He has low grade fever, but no chills. He is not eating well. He has no known sick contact.   Preventive Screening-Counseling & Management  Alcohol-Tobacco     Alcohol type: beer and liquor - only on weekends     Smoking Status: current     Smoking Cessation Counseling: yes     Packs/Day: 1 pack/ week     Year Started: AT THE AGE OF 16  Caffeine-Diet-Exercise     Does Patient Exercise: no     Type of exercise:  WALKING     Exercise (avg: min/session): 15     Times/week: 3  Allergies (verified): No Known Drug Allergies  Past History:  Past Medical History: Last updated: 12/05/2007 HTN Chronic back pain-- MRI with  L2-3 degenerative chages, disc bulge and mild facet & ligamentous hypertrophy combine mild multifactorial stenosis. 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 Mild Aortic stenosis - aortic valve area 1.53cm sq 9/07 from 1.71cmsq 2004 Mild dilated left atrium Tobacco abuse prior admits for chest pain.  Chronic chest pain    negative cath 2003    negative stress myoview 2008    Unchanged echo 7/07 with EF 65%      Family History: Last updated: 2006-10-22 Mother deceased at age 55s from CAD Father deceased age 33 from colon cancer 9 brothers, 1 deceased from drowning accident.- 2 brothers on dialysis 2 sisters with HTN, DM  Social History: Last updated: 10-22-06 Lives in [place] with girlfriend. Tobacco abuse, etoh, No IVDU. 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  Risk Factors: Exercise: no (09/28/2009)  Risk Factors: Smoking Status: current (09/28/2009) Packs/Day: 1 pack/ week (09/28/2009)  Review of Systems  See HPI  Physical Exam  General:  alert and overweight-appearing.   Head:  normocephalic and atraumatic.   Eyes:  vision grossly intact, pupils equal, pupils round, and pupils reactive to light.   Ears:  no external deformities.   Nose:  no external deformity.   Mouth:  pharynx pink and moist.   Neck:  supple.   Lungs:  diffuse wheezing R>L/ air entry ok. no crackles.    Heart:  normal rate, regular rhythm, and Grade   4/6 systolic ejection murmur.   Abdomen:  Bowel sounds positive,abdomen soft and non-tender without masses, organomegaly or hernias noted. Msk:  ttp over the right side over the ribs.  pain with twisting motions. strength in extremities 5/5  Extremities:  no  edema Neurologic:  alert & oriented X3, cranial nerves II-XII intact, strength normal in all extremities, and gait normal.   pt is stooping foward while walking, uses cane for support.  Skin:  no suspicious lesions.   Psych:  Oriented X3, memory intact for recent and remote, and slightly anxious.     Impression & Recommendations:  Problem # 1:  ACUTE BRONCHITIS (ICD-466.0) will treat for possible CAP/bronchitis. will get CXR to r/l consolidation and plural effusion. Will provide NSAID short course for possible pleuritis.  Orders: CXR- 2view (CXR)  His updated medication list for this problem includes:    Avelox 400 Mg Tabs (Moxifloxacin hcl) .[name]... Take 1 tablet by mouth once a day    Ventolin Hfa 108 (90 Base) Mcg/act Aers (Albuterol sulfate) .[name]... Take one to two puffs every four to six hours as needed.  Problem # 2:  RIB PAIN, RIGHT SIDED (ICD-786.50) see Problem #1.  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 Continue to have claudication. Does not want surgical itnervention at this time.   Problem # 4:  HYPERTENSION (ICD-401.9) BP stable. No changes made in his med list.  His updated medication list for this problem includes:    Hydrochlorothiazide 25 Mg Tabs (Hydrochlorothiazide) .[name]... Take 1 tablet by mouth once a day    Lisinopril 40 Mg Tabs (Lisinopril) .[name]... Take one table by mouth daily  BP today: 139/78 Prior BP: 134/76 (09/07/2009)  Labs Reviewed: K+: 4.5 (04/17/2009) Creat: : 1.10 (04/17/2009)   Chol: 159 (04/17/2009)   HDL: 46 (04/17/2009)   LDL: 89 (04/17/2009)   TG: 118 (04/17/2009)  Problem # 5:  GERD (ICD-530.81) stable. No problems at this time. NO epigastric pain. Will continue on omeprazole. He has prior failed discountinuation of PPI. His updated medication list for this problem includes:    Prilosec 40 Mg Cpdr (Omeprazole) .[name]... Take 1 tablet by mouth once a day  Complete Medication List: 1)   Hydrochlorothiazide 25 Mg Tabs (Hydrochlorothiazide) .... Take 1 tablet by mouth once a day 2)  Aspirin 81 Mg Tbec (Aspirin) .... Take 1 tablet by mouth once a day 3)  Prilosec 40 Mg Cpdr (Omeprazole) .... Take 1 tablet by mouth once a day 4)  Vicodin 5-500 Mg Tabs (Hydrocodone-acetaminophen) .... One tablet every 4 hours as needed for pain. 5)  Nitroglycerin Cr 2.5 Mg Cr-caps (Nitroglycerin) .... As needed 6)  Lisinopril 40 Mg Tabs (Lisinopril) .... Take one table by mouth daily 7)  Avelox 400 Mg Tabs (Moxifloxacin hcl) .... Take 1 tablet by mouth once a day 8)  Ventolin Hfa 108 (90 Base) Mcg/act Aers (Albuterol sulfate) .... Take one to two puffs every four to six hours as needed.  9)  Ibuprofen 800 Mg Tabs (Ibuprofen) .... Take one tablet every six hours as needed for rib pain  Patient Instructions: 1)  F/U in one week. 2)  If you get more shortness of breath, call us back to go to Emergency Department.  3)  You are started on new antibiotic that you are going to use for eight days- one tablet a day. 4)  MD will call you if your chest xray is concerning.  Prescriptions: IBUPROFEN 800 MG TABS (IBUPROFEN) Take one tablet every six hours as needed for rib pain  #28 x 0   Entered and Authorized by:   Clerance Lav MD   Signed by:   Clerance Lav MD on 09/28/2009   Method used:   [facility]  [address] [phone][facility] (retail)       [address]       [place], [place]  [id number]       Ph: [phone] or [phone]       Fax: [phone]   RxID:   [id number] AVELOX 400 MG TABS (MOXIFLOXACIN HCL) Take 1 tablet by mouth once a day  #8 x 0   Entered and Authorized by:   Clerance Lav MD   Signed by:   Clerance Lav MD on 09/28/2009   Method used:   [facility]        [facility]  [address] [phone]* (retail)       [address]       [place], [place]  [id number]       Ph: [phone] or [phone]        Fax: [phone]   RxID:   [id number]   Prevention & Chronic Care Immunizations   Influenza vaccine: Fluvax 3+  (03/19/2009)    Tetanus booster: Not documented   Td booster deferral: Deferred  (02/19/2009)    Pneumococcal vaccine: Pneumovax  (07/06/2008)   Pneumococcal vaccine due: 07/06/2013    H. zoster vaccine: Not documented  Colorectal Screening   Hemoccult: Not documented   Hemoccult action/deferral: Deferred  (02/19/2009)    Colonoscopy: Location:  [facility].    (03/24/2008)   Colonoscopy due: 03/2013  Other Screening   PSA: Not documented   PSA action/deferral: Discussed-PSA declined  (02/19/2009)   Smoking status: current  (09/28/2009)   Smoking cessation counseling: yes  (09/28/2009)  Lipids   Total Cholesterol: 159  (04/17/2009)   Lipid panel action/deferral: Lipid Panel ordered   LDL: 89  (04/17/2009)   LDL Direct: Not documented   HDL: 46  (04/17/2009)   Triglycerides: 118  (04/17/2009)    SGOT (AST): 23  (04/17/2009)   BMP action: Ordered   SGPT (ALT): 15  (04/17/2009)   Alkaline phosphatase: 45  (04/17/2009)   Total bilirubin: 0.8  (04/17/2009)  Hypertension   Last Blood Pressure: 139 / 78  (09/28/2009)   Serum creatinine: 1.10  (04/17/2009)   BMP action: Ordered   Serum potassium 4.5  (04/17/2009)  Self-Management Support :   Personal Goals (by the next clinic visit) :      Personal blood pressure goal: 140/90  (02/19/2009)     Personal LDL goal: 100  (02/19/2009)    Patient will work on the following items until the next clinic visit to reach self-care goals:     Medications and monitoring: take my medicines every  day  (09/28/2009)     Eating: drink diet soda or water instead of juice or soda, eat more vegetables, use fresh or frozen vegetables, eat foods that are low in salt, eat baked foods instead of fried foods, eat fruit for snacks and desserts  (09/28/2009)     Activity: take a 30 minute walk every day, park at  the far end of the parking lot  (09/28/2009)    Hypertension self-management support: Education handout, Resources for patients handout  (09/28/2009)   Hypertension education handout printed    Lipid self-management support: Education handout, Resources for patients handout  (09/28/2009)     Lipid education handout printed      Resource handout printed.

## 2010-07-28 NOTE — Assessment & Plan Note (Signed)
Summary: FU VISIT/DS   Vital Signs:  Patient profile:   65 year old male Height:      74 inches (187.96 cm) Weight:      283.0 pounds (128.64 kg) BMI:     36.47 Temp:     98.0 degrees F (36.67 degrees C) oral Pulse rate:   76 / minute BP sitting:   148 / 87  (right arm) Cuff size:   regular  Vitals Entered By: Theotis Barrio NT II (February 23, 2010 10:48 AM)  Primary Care Provider:  Clerance Lav MD   History of Present Illness: 66 years old male with Past Medical History: HTN Chronic back pain-- MRI with  L2-3 degenerative chages, disc bulge and mild facet & ligamentous hypertrophy combine mild multifactorial stenosis. 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 Mild Aortic stenosis - aortic valve area 1.53cm sq 9/07 from 1.71cmsq 2004 Mild dilated left atrium Tobacco abuse prior admits for chest pain.  Chronic chest pain    negative cath 2003    negative stress myoview 2008    Unchanged echo 7/07 with EF 65%      is here with complain of neck pain. The pain started 2-3 days ago and wakes him up from sleep. At times it radiates to shoulder. His shoulder pain is stable. He did not go for the follow up appointment because he did not know about it. His pain at other sites is getting worse and he needs to take 2 percoet at a time for releif. He is running out of meds early.    Allergies (verified): No Known Drug Allergies  Past History:  Past Medical History: Last updated: 12/05/2007 HTN Chronic back pain-- MRI with  L2-3 degenerative chages, disc bulge and mild facet & ligamentous hypertrophy combine mild multifactorial stenosis. 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 Mild Aortic stenosis - aortic valve area 1.53cm sq 9/07 from 1.71cmsq 2004 Mild dilated left atrium Tobacco abuse prior admits for chest  pain.  Chronic chest pain    negative cath 2003    negative stress myoview 2008    Unchanged echo 7/07 with EF 65%      Family History: Last updated: Nov 07, 2006 Mother deceased at age 60s from CAD Father deceased age 3 from colon cancer 9 brothers, 1 deceased from drowning accident.- 2 brothers on dialysis 2 sisters with HTN, DM  Social History: Last updated: 10/27/2009 Lives in [place] with girlfriend. Tobacco abuse, etoh, No IVDU. 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 [name] (first cousin) - [phone]  Risk Factors: Exercise: no (11/25/2009)  Risk Factors: Smoking Status: current (11/25/2009) Packs/Day: 1 pack/ week (11/25/2009)  Review of Systems      See HPI  Physical Exam  General:  Well-developed,well-nourished,in no acute distress; alert,appropriate and cooperative throughout examination Head:  normocephalic and atraumatic.   Eyes:  vision grossly intact, pupils equal, pupils round, and pupils reactive to light.   Ears:  no external deformities.   Nose:  no external deformity.   Mouth:  pharynx pink and moist.   Lungs:  normal breath sounds, no crackles, and no wheezes.   Heart:  normal rate, regular rhythm, no gallop, and no rub. Grade 4/6 systolic murmur throughout precordium that radiates to the left axilla and doesnot radiate to b/l carotids.   Abdomen:  Bowel sounds positive,abdomen soft and non-tender without masses, organomegaly or  hernias noted. Msk:  decreased joint motion in right shoulder. neck is stiff. localised tenderness over right shoulder and both knees.    Neurologic:  alert & oriented X3.     Impression & Recommendations:  Problem # 1:  SHOULDER PAIN, RIGHT (ICD-719.41) Given his poor pain control and pain in multiple other joints, I have put him on long acting narcotic with breakthrough coverage. WE have a new pain contract for the same. d/c tramadol.  The following medications were removed from the medication list:    Ibuprofen 800 Mg Tabs  (Ibuprofen) .[name]... Take one tablet every six hours as needed for rib pain    Tramadol Hcl 50 Mg Tabs (Tramadol hcl) .[name]... 1 tab by mouth q 12 hrs as needed for pain His updated medication list for this problem includes:    Aspirin 81 Mg Tbec (Aspirin) .[name]... Take 1 tablet by mouth once a day    Vicodin 5-500 Mg Tabs (Hydrocodone-acetaminophen) ..... One tablet every 4 hours as needed for pain.    Oxycontin 20 Mg Xr12h-tab (Oxycodone hcl) .[name]... Take one tablet twice daily  Problem # 2:  HYPERLIPIDEMIA (ICD-272.4) stable,not on any meds.  Labs Reviewed: SGOT: 20 (12/09/2009)   SGPT: 15 (12/09/2009)   HDL:51 (12/09/2009), 46 (04/17/2009)  LDL:91 (12/09/2009), 89 (04/17/2009)  Chol:155 (12/09/2009), 159 (04/17/2009)  Trig:66 (12/09/2009), 118 (04/17/2009)  Problem # 3:  GERD (ICD-530.81) stable, no changes made.  His updated medication list for this problem includes:    Prilosec 40 Mg Cpdr (Omeprazole) .[name]... Take 1 tablet by mouth once a day  Problem # 4:  AORTIC STENOSIS, MILD (ICD-424.1) stable,no symptoms of severe AS. Continue to monitor.  His updated medication list for this problem includes:    Aspirin 81 Mg Tbec (Aspirin) .[name]... Take 1 tablet by mouth once a day  Problem # 5:  HIP PAIN, RIGHT (ICD-719.45) see discussion in #1. His hip pain is well controlled right now. He continues to use cane for support.  The following medications were removed from the medication list:    Ibuprofen 800 Mg Tabs (Ibuprofen) .[name]... Take one tablet every six hours as needed for rib pain    Tramadol Hcl 50 Mg Tabs (Tramadol hcl) .[name]... 1 tab by mouth q 12 hrs as needed for pain His updated medication list for this problem includes:    Aspirin 81 Mg Tbec (Aspirin) .[name]... Take 1 tablet by mouth once a day    Vicodin 5-500 Mg Tabs (Hydrocodone-acetaminophen) ..... One tablet every 4 hours as needed for pain.    Oxycontin 20 Mg Xr12h-tab (Oxycodone hcl) .[name]... Take one tablet twice daily  Complete Medication  List: 1)  Hydrochlorothiazide 25 Mg Tabs (Hydrochlorothiazide) .... Take 1 tablet by mouth once a day 2)  Aspirin 81 Mg Tbec (Aspirin) .... Take 1 tablet by mouth once a day 3)  Prilosec 40 Mg Cpdr (Omeprazole) .... Take 1 tablet by mouth once a day 4)  Vicodin 5-500 Mg Tabs (Hydrocodone-acetaminophen) .... One tablet every 4 hours as needed for pain. 5)  Nitroglycerin Cr 2.5 Mg Cr-caps (Nitroglycerin) .... As needed 6)  Lisinopril 40 Mg Tabs (Lisinopril) .... Take one table by mouth daily 7)  Ventolin Hfa 108 (90 Base) Mcg/act Aers (Albuterol sulfate) .... Take one to two puffs every four to six hours as needed. 8)  Oxycontin 20 Mg Xr12h-tab (Oxycodone hcl) .... Take one tablet twice daily  Other Orders: Influenza Vaccine MCR (16109)  Patient Instructions: 1)  Please schedule a follow-up  appointment in 1 month. Prescriptions: HYDROCHLOROTHIAZIDE 25 MG TABS (HYDROCHLOROTHIAZIDE) Take 1 tablet by mouth once a day  #30 x 10   Entered and Authorized by:   Clerance Lav MD   Signed by:   Clerance Lav MD on 02/23/2010   Method used:   Electronically to        [facility] [address] [phone]* (retail)       [address]       [place], [place]  [address]       Ph: [phone] or [phone]       Fax: [phone]   RxID:   [id number] LISINOPRIL 40 MG TABS (LISINOPRIL) Take one table by mouth daily  #30 x 6   Entered and Authorized by:   Clerance Lav MD   Signed by:   Clerance Lav MD on 02/23/2010   Method used:   [facility]        [facility] [address] [phone]* (retail)       [address]       [place], [place]  [address]       Ph: [phone] or [phone]       Fax: [phone]   RxID:   [id number] PRILOSEC 40 MG  CPDR (OMEPRAZOLE) Take 1 tablet by mouth once a day  #30 x 3   Entered and Authorized by:   Clerance Lav MD   Signed by:   Clerance Lav MD on 02/23/2010   Method used:   [facility] to         [facility] [address] [facility] [address] [phone]* (retail)       [address]       [place], [place]  [address]       Ph: [phone] or [phone]       Fax: [phone]   RxID:   [id number] OXYCONTIN 20 MG XR12H-TAB (OXYCODONE HCL) Take one tablet twice daily  #60 x 0   Entered and Authorized by:   Clerance Lav MD   Signed by:   Clerance Lav MD on 02/23/2010   Method used:   Print then Give to Patient   RxID:   [id number] VICODIN 5-500 MG  TABS (HYDROCODONE-ACETAMINOPHEN) one tablet every 4 hours as needed for pain.  #30 x 0   Entered and Authorized by:   Clerance Lav MD   Signed by:   Clerance Lav MD on 02/23/2010   Method used:   Print then Give to Patient   RxID:   [id number]   Prevention & Chronic Care Immunizations   Influenza vaccine: Fluvax MCR  (02/23/2010)   Influenza vaccine deferral: Deferred  (11/25/2009)    Tetanus booster: Not documented   Td booster deferral: Deferred  (02/19/2009)    Pneumococcal vaccine: Pneumovax  (07/06/2008)   Pneumococcal vaccine deferral: Deferred  (11/25/2009)   Pneumococcal vaccine due: 07/06/2013    H. zoster vaccine: Not documented   H. zoster vaccine deferral: Deferred  (11/25/2009)  Colorectal Screening   Hemoccult: Not documented   Hemoccult action/deferral: Deferred  (02/19/2009)    Colonoscopy: Location:  [facility].    (03/24/2008)   Colonoscopy action/deferral: Deferred  (11/25/2009)   Colonoscopy due: 03/2013  Other Screening   PSA: Not documented   PSA action/deferral: Discussed-PSA declined  (02/19/2009)   Smoking status: current  (11/25/2009)   Smoking cessation  counseling: yes  (11/25/2009)  Lipids   Total Cholesterol: 155  (12/09/2009)   Lipid panel action/deferral: Lipid Panel ordered   LDL: 91  (12/09/2009)   LDL Direct: Not documented   HDL: 51  (12/09/2009)   Triglycerides: 66  (12/09/2009)    SGOT (AST): 20  (12/09/2009)   BMP action: Ordered    SGPT (ALT): 15  (12/09/2009)   Alkaline phosphatase: 51  (12/09/2009)   Total bilirubin: 0.4  (12/09/2009)    Lipid flowsheet reviewed?: Yes   Progress toward LDL goal: At goal  Hypertension   Last Blood Pressure: 148 / 87  (02/23/2010)   Serum creatinine: 0.99  (12/09/2009)   BMP action: Ordered   Serum potassium 4.3  (12/09/2009)    Hypertension flowsheet reviewed?: Yes   Progress toward BP goal: At goal  Self-Management Support :   Personal Goals (by the next clinic visit) :      Personal blood pressure goal: 140/90  (02/19/2009)     Personal LDL goal: 100  (02/19/2009)    Patient will work on the following items until the next clinic visit to reach self-care goals:     Medications and monitoring: take my medicines every day, check my blood pressure, bring all of my medications to every visit  (02/23/2010)     Eating: eat foods that are low in salt, eat baked foods instead of fried foods  (02/23/2010)     Activity: take a 30 minute walk every day  (02/23/2010)    Hypertension self-management support: Written self-care plan  (02/23/2010)   Hypertension self-care plan printed.    Lipid self-management support: Written self-care plan  (02/23/2010)   Lipid self-care plan printed.   Nursing Instructions: Give Flu vaccine today Give tetanus booster today    Influenza Vaccine    Vaccine Type: Fluvax MCR    Site: left deltoid    Mfr: [employer]oSmithK[employer]    Dose: 0.5 ml    Route: IM    Given by: Chinita Pester RN    Exp. Date: 12/24/2010    Lot #: [id number]    VIS given: 01/17/07 version given February 23, 2010.  Flu Vaccine Consent Questions    Do you have a history of severe allergic reactions to this vaccine? no    Any prior history of allergic reactions to egg and/or gelatin? no    Do you have a sensitivity to the preservative Thimersol? no    Do you have a past history of Guillan-Barre Syndrome? no    Do you currently have an acute febrile illness? no    Have you ever  had a severe reaction to latex? no    Vaccine information given and explained to patient? yes

## 2010-07-28 NOTE — Letter (Signed)
Summary: [employer]   [facility]   Imported By: Margie Billet 07/15/2010 13:41:29  _____________________________________________________________________  External Attachment:    Type:   Image     Comment:   External Document

## 2010-07-28 NOTE — Assessment & Plan Note (Signed)
Summary: abd pain/gg   Vital Signs:  Patient profile:   65 year old male Height:      74 inches (187.96 cm) Weight:      281.3 pounds (127.86 kg) BMI:     36.25 Temp:     97.7 degrees F (36.50 degrees C) oral Pulse rate:   66 / minute BP sitting:   138 / 80  (right arm)  Vitals Entered By: Stanton Kidney Ditzler RN (July 23, 2009 10:12 AM) Is Patient Diabetic? No Pain Assessment Patient in pain? no      Nutritional Status BMI of 25 - 29 = overweight Nutritional Status Detail appetite good  Have you ever been in a relationship where you felt threatened, hurt or afraid?denies   Does patient need assistance? Functional Status Self care Ambulation Impaired:Risk for fall Comments Uses a cane. Past 2-4 days - occ sharp pain right side chest area mostly with movement.   Primary Care Provider:  Clerance Lav MD   History of Present Illness: This is a  year old man with past medical history outlined in chart.     Here with complaint of right sided chest/side pain (over the lateral ribs) on and off worse with movement for several days.  Not currently having symptoms.  Has pain mostly at night when he moves in the bed and when he gets up and startes walking.  If he keeps walking it will ease off.  No dysuria or urinary symptoms.  No fever or chills, no shortness of breath or cough.  No history of trauma or new exercise.  Depression History:      The patient denies a depressed mood most of the day and a diminished interest in his usual daily activities.         Preventive Screening-Counseling & Management  Alcohol-Tobacco     Alcohol type: beer and liquor - only on weekends     Smoking Status: current     Smoking Cessation Counseling: yes     Packs/Day: 1 pack/ week     Year Started: AT THE AGE OF 16  Caffeine-Diet-Exercise     Does Patient Exercise: no     Type of exercise: WALKING     Exercise (avg: min/session): 15     Times/week: 3  Current Medications (verified): 1)   Hydrochlorothiazide 25 Mg Tabs (Hydrochlorothiazide) .... Take 1 Tablet By Mouth Once A Day 2)  Aspirin 81 Mg Tbec (Aspirin) .... Take 1 Tablet By Mouth Once A Day 3)  Prilosec 40 Mg  Cpdr (Omeprazole) .... Take 1 Tablet By Mouth Once A Day 4)  Vicodin 5-500 Mg  Tabs (Hydrocodone-Acetaminophen) .... One Tablet Every 4 Hours As Needed For Pain. 5)  Nitroglycerin Cr 2.5 Mg  Cr-Caps (Nitroglycerin) .... As Needed 6)  Lisinopril 40 Mg Tabs (Lisinopril) .... Take One Table By Mouth Daily  Allergies (verified): No Known Drug Allergies  Review of Systems       per hpi  Physical Exam  General:  alert and overweight-appearing.   Head:  normocephalic and atraumatic.   Eyes:  vision grossly intact, pupils equal, pupils round, and pupils reactive to light.   Mouth:  pharynx pink and moist.   Lungs:  normal respiratory effort and normal breath sounds.   Heart:  normal rate, regular rhythm, and Grade   4/6 systolic ejection murmur.   Abdomen:  soft, non-tender, normal bowel sounds, no masses, no guarding, no rigidity, no hepatomegaly, no splenomegaly, and distended.   Msk:  ttp over the right side over the ribs.  pain with twisting motions. strength in extremities 5/5  Pulses:  1+ Extremities:  no edema Neurologic:  alert & oriented X3, cranial nerves II-XII intact, strength normal in all extremities, and gait normal.   Skin:  no suspicious lesions.   Psych:  Oriented X3, memory intact for recent and remote, and slightly anxious.     Impression & Recommendations:  Problem # 1:  RIB PAIN, RIGHT SIDED (ICD-786.50) I think this is muscluloskeletal either a pulled intercostal or a cracked rib (though I would expect a hx of trauma with this). There is no CVA tenderness- no pyelo, no skin lesions to suggest zoster, no vertebral tenderness or leg weakness, lung exam is normal.   Will recomend ibuprofen for one week with hot pad and see if symptoms improve.   Problem # 2:  HYPERLIPIDEMIA  (ICD-272.4) lipds good on last check  Labs Reviewed: SGOT: 23 (04/17/2009)   SGPT: 15 (04/17/2009)   HDL:46 (04/17/2009), 69 (05/04/2008)  LDL:89 (04/17/2009), 99 (05/04/2008)  Chol:159 (04/17/2009), 201 (05/04/2008)  Trig:118 (04/17/2009), 166 (05/04/2008)  Problem # 3:  PREVENTIVE HEALTH CARE (ICD-V70.0) cought up on colonoscopy  Complete Medication List: 1)  Hydrochlorothiazide 25 Mg Tabs (Hydrochlorothiazide) .... Take 1 tablet by mouth once a day 2)  Aspirin 81 Mg Tbec (Aspirin) .... Take 1 tablet by mouth once a day 3)  Prilosec 40 Mg Cpdr (Omeprazole) .... Take 1 tablet by mouth once a day 4)  Vicodin 5-500 Mg Tabs (Hydrocodone-acetaminophen) .... One tablet every 4 hours as needed for pain. 5)  Nitroglycerin Cr 2.5 Mg Cr-caps (Nitroglycerin) .... As needed 6)  Lisinopril 40 Mg Tabs (Lisinopril) .... Take one table by mouth daily  Patient Instructions: 1)  You have a pulled muscle on your right side. 2)  You should try ibuprofen 200mg  three times a day for one week for the pain. 3)  You can also use a heating pad at night. 4)  You should continue to walk to relieve the pain and help with weight loss.   Prevention & Chronic Care Immunizations   Influenza vaccine: Fluvax 3+  (03/19/2009)    Tetanus booster: Not documented   Td booster deferral: Deferred  (02/19/2009)    Pneumococcal vaccine: Pneumovax  (07/06/2008)   Pneumococcal vaccine due: 07/06/2013    H. zoster vaccine: Not documented  Colorectal Screening   Hemoccult: Not documented   Hemoccult action/deferral: Deferred  (02/19/2009)    Colonoscopy: Location:  [facility].    (03/24/2008)   Colonoscopy due: 03/2013  Other Screening   PSA: Not documented   PSA action/deferral: Discussed-PSA declined  (02/19/2009)   Smoking status: current  (07/23/2009)   Smoking cessation counseling: yes  (07/23/2009)  Lipids   Total Cholesterol: 159  (04/17/2009)   Lipid panel action/deferral: Lipid Panel  ordered   LDL: 89  (04/17/2009)   LDL Direct: Not documented   HDL: 46  (04/17/2009)   Triglycerides: 118  (04/17/2009)    SGOT (AST): 23  (04/17/2009)   BMP action: Ordered   SGPT (ALT): 15  (04/17/2009)   Alkaline phosphatase: 45  (04/17/2009)   Total bilirubin: 0.8  (04/17/2009)  Hypertension   Last Blood Pressure: 138 / 80  (07/23/2009)   Serum creatinine: 1.10  (04/17/2009)   BMP action: Ordered   Serum potassium 4.5  (04/17/2009)    Hypertension flowsheet reviewed?: Yes   Progress toward BP goal: Deteriorated  Self-Management Support :   Personal Goals (  by the next clinic visit) :      Personal blood pressure goal: 140/90  (02/19/2009)     Personal LDL goal: 100  (02/19/2009)    Patient will work on the following items until the next clinic visit to reach self-care goals:     Medications and monitoring: take my medicines every day, bring all of my medications to every visit  (07/23/2009)     Eating: eat more vegetables, use fresh or frozen vegetables, eat fruit for snacks and desserts  (07/23/2009)     Activity: take a 30 minute walk every day, park at the far end of the parking lot  (07/23/2009)    Hypertension self-management support: Written self-care plan  (04/16/2009)    Lipid self-management support: Written self-care plan  (04/16/2009)

## 2010-07-28 NOTE — Medication Information (Signed)
Summary: OXYCODONE  OXYCODONE   Imported By: Margie Billet 05/09/2010 11:20:11  _____________________________________________________________________  External Attachment:    Type:   Image     Comment:   External Document

## 2010-07-28 NOTE — Assessment & Plan Note (Signed)
Summary: F/U Back and Right shoulder pain (PCP Sherryll Burger)   Vital Signs:  Patient profile:   65 year old male Height:      74 inches (187.96 cm) Weight:      281.6 pounds (128.82 kg) BMI:     36.29 Temp:     97.7 degrees F (36.50 degrees C) oral Pulse rate:   67 / minute BP sitting:   148 / 79  (left arm) Cuff size:   large  Vitals Entered By: Theotis Barrio NT II (April 08, 2010 10:49 AM) CC: F/u back and shoulder pain, medication trouble Pain Assessment Patient in pain? yes     Location: Back/ Right Shoulder Intensity:     6-7 Type: stinging Onset of pain  Chronic Nutritional Status BMI of > 30 = obese  Have you ever been in a relationship where you felt threatened, hurt or afraid?No   Does patient need assistance? Functional Status Self care Ambulation Normal Comments WALKS WITH THE ASSIST OF A CANE   Primary Care Provider:  Clerance Lav MD  CC:  F/u back and shoulder pain and medication trouble.  History of Present Illness: Pt is a 65 yo M with PMHx of HTN, aortic stenosis, HLD, lumbar spinal stenosis who presents to clinic today with multiple medical concerns, including the following:   1) Chronic back pain - since at least 2002, per record review. MRI 2002 showed DDD and facet disease, spinal stenosis at L4-5. Today pt describes his pain a 5-6/10 dull pain, not progressing in severity. Worse with activity, alleviated by sitting, Vicodin. Occasional shooting pain down lateral aspect of legs. No numbness or tingling, no weakness of legs, no saddle anesthesia.  2) Right shoulder pain - ongoing since at least 10/2009, no known injury or trauma. 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 Was evaluated by Sports medicine 11/2009, recommended  physical therapy, and RTC in 1 month for reevaluation. Pt did not go to PT because says he expects it will hurt too much, and did not f/u with Sports Medicine.  Today, pain described as a 7/10, constant  pain, that is worse at times with activity. Localized to right shoulder, no numbness, tingling, or weakness in arm or hand. Currently taking Vicodin 5-500 mg QID for pain management. Unable to get Oxycontin filled because pharmacy states not covered by insurance. Just got Vicodin prescription filled on this week.   3) HTN - Patient does not check blood pressure regularly at home. Currently taking HCTZ 25mg  daily, Lisinopril 40mg  daily. Per record review it seems that his BP has never been that well controlled with average in 160s mmHg systolic. Currently denies headaches, dizziness, lightheadedness, chest pain, shortness of breath.      Preventive Screening-Counseling & Management  Alcohol-Tobacco     Alcohol type: beer and liquor - only on weekends     Smoking Status: current     Smoking Cessation Counseling: yes     Packs/Day: 1 pack/ week     Year Started: AT THE AGE OF 16  Caffeine-Diet-Exercise     Does Patient Exercise: yes     Type of exercise: WALKING     Exercise (avg: min/session): 15     Times/week: 3  Current Medications (verified): 1)  Hydrochlorothiazide 25 Mg Tabs (Hydrochlorothiazide) .... Take 1 Tablet By Mouth Once A Day 2)  Aspirin 81 Mg Tbec (Aspirin) .... Take 1 Tablet By Mouth Once A Day 3)  Prilosec 40 Mg  Cpdr (  Omeprazole) .... Take 1 Tablet By Mouth Once A Day 4)  Vicodin 5-500 Mg  Tabs (Hydrocodone-Acetaminophen) .... One Tablet Every 4 Hours As Needed For Pain. 5)  Nitroglycerin Cr 2.5 Mg  Cr-Caps (Nitroglycerin) .... As Needed 6)  Lisinopril 40 Mg Tabs (Lisinopril) .... Take One Table By Mouth Daily 7)  Ventolin Hfa 108 (90 Base) Mcg/act Aers (Albuterol Sulfate) .... Take One To Two Puffs Every Four To Six Hours As Needed.  Allergies (verified): No Known Drug Allergies  Past History:  Past Medical History: HTN Chronic back pain-- MRI (2002) with  L2-3 degenerative chages, disc bulge and mild facet & ligamentous hypertrophy combine mild multifactorial  stenosis. 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 Mild Aortic stenosis - aortic valve area 1.53cm sq 9/07 from 1.71cmsq 2004 Mild dilated left atrium Tobacco abuse prior admits for chest pain.  Chronic chest pain    negative cath 2003    negative stress myoview 2008    Unchanged echo 7/07 with EF 65%      Social History: Does Patient Exercise:  yes  Review of Systems       No CP, difficulty breathing, shortness of breathing, syncope, severe headaches, or altered mental status.  Physical Exam  General:  General: Vital signs reviewed and noted. Well-developed,well-nourished, in no acute distress; alert,appropriate and cooperative throughout examination. Head: normocephalic, atraumatic. Lungs: Normal respiratory effort. Clear to auscultation BL without crackles or wheezes.  Heart: RRR. Grade 3/6 systolic murmur with  radiation to the left axilla and without radiation to b/l carotids.   Abdomen: BS normoactive.  Extremities: 1-2+ pretibial edema. No apparent deformity of spine. Normal FROM of back and shoulders.       Impression & Recommendations:  Problem # 1:  BACK PAIN, LUMBAR, CHRONIC (ICD-724.2) Assessment Unchanged Given chronicity of his back pain, will likely benefit from longer-acting pain medications. - Will plan continue Oxycontin 20mg  - Spoke with [facility] pharmacy who said Rx was denied because recent refill of Vicodin and there was concern that if pt got Percocet  - potentially too much Acetominophen would be ingested. However, per our records, pt was prescribed only Oxycontin therefore shouldn't have been an issue. Pharmacy made aware and states should be covered by insurance.  His updated medication list for this problem includes:    Aspirin 81 Mg Tbec (Aspirin) .[name]... Take 1 tablet by mouth once a day    Vicodin 5-500 Mg Tabs (Hydrocodone-acetaminophen) ..... One tablet every 6  hours as needed for breakthrough pain > 7/10.    Oxycontin 20 Mg Xr12h-tab (Oxycodone hcl) .[name]... 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 Pain persistent, although controlled with Vicodin. Pt requesting medication that doesn't have to be taken as frequently. - As per assessment #1, wills start Oxycontin - PT referral made - as per sports medicine recs, I think it will help with long term strengthening and pain management.   His updated medication list for this problem includes:    Aspirin 81 Mg Tbec (Aspirin) .[name]... Take 1 tablet by mouth once a day    Vicodin 5-500 Mg Tabs (Hydrocodone-acetaminophen) ..... One tablet every 6 hours as needed for breakthrough pain > 7/10.    Oxycontin 20 Mg Xr12h-tab (Oxycodone  hcl) ..... Take one tablet twice daily  Orders: Physical Therapy Referral (PT)  Problem # 3:  HYPERTENSION (ICD-401.9) Assessment: Deteriorated Still not well controlled. Has hx of aortic stenosis and regurgitation, therefore CCB indicated. However, in setting of chronic BL LE edema will only start low-dose. - Will add Norvasc 5mg  daily, reevaluate next visit.  His updated medication list for this problem includes:    Hydrochlorothiazide 25 Mg Tabs (Hydrochlorothiazide) .[name]... Take 1 tablet by mouth once a day    Lisinopril 40 Mg Tabs (Lisinopril) .[name]... Take one table by mouth daily    Norvasc 5 Mg Tabs (Amlodipine besylate) .[name]... Take 1 tablet by mouth once a day  Complete Medication List: 1)  Hydrochlorothiazide 25 Mg Tabs (Hydrochlorothiazide) .... Take 1 tablet by mouth once a day 2)  Aspirin 81 Mg Tbec (Aspirin) .... Take 1 tablet by mouth once a day 3)  Prilosec 40 Mg Cpdr (Omeprazole) .... Take 1 tablet by mouth once a day 4)  Vicodin 5-500 Mg Tabs  (Hydrocodone-acetaminophen) .... One tablet every 6 hours as needed for breakthrough pain > 7/10. 5)  Nitroglycerin Cr 2.5 Mg Cr-caps (Nitroglycerin) .... As needed 6)  Lisinopril 40 Mg Tabs (Lisinopril) .... Take one table by mouth daily 7)  Ventolin Hfa 108 (90 Base) Mcg/act Aers (Albuterol sulfate) .... Take one to two puffs every four to six hours as needed. 8)  Oxycontin 20 Mg Xr12h-tab (Oxycodone hcl) .... Take one tablet twice daily 9)  Norvasc 5 Mg Tabs (Amlodipine besylate) .... Take 1 tablet by mouth once a day  Patient Instructions: 1)  Please follow-up at the clinic in 1-2 months with your PCP, Dr. Sherryll Burger, at which time we will reevaluate your pain and blood pressure. 2)  You have been started on Amlodipine today. If you develop facial swelling, difficulty breathing, throat closing, rash, please stop the medication and call the clinic at [phone]. 3)  I will call you about when to get the Oxycontin filled.  4)  Please follow-up with physical therapy. 5)  If you have worsening of your symptoms, develop chest pain, difficulty breathing, please call the clinic. 6)  Please bring all of your medications in a bag to your next visit. Prescriptions: NORVASC 5 MG TABS (AMLODIPINE BESYLATE) Take 1 tablet by mouth once a day  #30 x 5   Entered and Authorized by:   Johnette Abraham DO   Signed by:   Johnette Abraham DO on 04/08/2010   Method used:   Print then Give to Patient   RxID:   [id number] OXYCONTIN 20 MG XR12H-TAB (OXYCODONE HCL) Take one tablet twice daily  #90 x 0   Entered and Authorized by:   Johnette Abraham DO   Signed by:   Johnette Abraham DO on 04/08/2010   Method used:   Print then Give to Patient   RxID:   [id number] NORVASC 5 MG TABS (AMLODIPINE BESYLATE) Take 1 tablet by mouth once a day  #30 x 5   Entered and Authorized by:   Johnette Abraham DO   Signed by:   Johnette Abraham DO on 04/08/2010   Method used:   Electronically to         [facility]  [address] [phone]* (retail)       [address]       [place], [place]  [id number]       Ph: [phone] or [phone]  Fax: [phone]   RxID:   [id number]   Prevention & Chronic Care Immunizations   Influenza vaccine: Fluvax MCR  (02/23/2010)   Influenza vaccine deferral: Deferred  (11/25/2009)   Influenza vaccine due: 02/25/2011    Tetanus booster: Not documented   Td booster deferral: Refused  (03/25/2010)    Pneumococcal vaccine: Pneumovax  (07/06/2008)   Pneumococcal vaccine deferral: Deferred  (11/25/2009)   Pneumococcal vaccine due: 07/06/2013    H. zoster vaccine: Not documented   H. zoster vaccine deferral: Deferred  (11/25/2009)  Colorectal Screening   Hemoccult: Not documented   Hemoccult action/deferral: Ordered  (03/25/2010)    Colonoscopy: Location:  [facility].    (03/24/2008)   Colonoscopy action/deferral: Deferred  (11/25/2009)   Colonoscopy due: 03/2013  Other Screening   PSA: Not documented   PSA action/deferral: Discussed-PSA declined  (02/19/2009)   Smoking status: current  (04/08/2010)   Smoking cessation counseling: yes  (04/08/2010)  Lipids   Total Cholesterol: 155  (12/09/2009)   Lipid panel action/deferral: Lipid Panel ordered   LDL: 91  (12/09/2009)   LDL Direct: Not documented   HDL: 51  (12/09/2009)   Triglycerides: 66  (12/09/2009)    SGOT (AST): 20  (12/09/2009)   BMP action: Ordered   SGPT (ALT): 15  (12/09/2009)   Alkaline phosphatase: 51  (12/09/2009)   Total bilirubin: 0.4  (12/09/2009)  Hypertension   Last Blood Pressure: 148 / 79  (04/08/2010)   Serum creatinine: 0.99  (12/09/2009)   BMP action: Ordered   Serum potassium 4.3  (12/09/2009)  Self-Management Support :   Personal Goals (by the next clinic visit) :      Personal blood pressure goal: 140/90  (02/19/2009)     Personal LDL goal: 100  (02/19/2009)    Patient will work on the  following items until the next clinic visit to reach self-care goals:     Medications and monitoring: take my medicines every day  (04/08/2010)     Eating: eat more vegetables, use fresh or frozen vegetables  (04/08/2010)     Activity: take a 30 minute walk every day  (04/08/2010)    Hypertension self-management support: Resources for patients handout  (04/08/2010)    Lipid self-management support: Resources for patients handout  (04/08/2010)     Self-management comments: PATIENT STATES THAT HE EATS WHATEVER HE CAN GET.      Resource handout printed.   Appended Document: Orders Update    Clinical Lists Changes  Orders: Added new Service order of Est. Patient Level IV (30865) - Signed      Appended Document: F/U Back and Right shoulder pain (PCP Sherryll Burger) I discussed Mr Kreider with Dr Saralyn Pilar and I agree with her documentation above.

## 2010-07-28 NOTE — Miscellaneous (Signed)
Summary: Orders Update  Clinical Lists Changes  Orders: Added new Referral order of Social Work Referral (Social ) - Signed 

## 2010-07-28 NOTE — Miscellaneous (Signed)
Summary: MEDICATION CONTRACT  MEDICATION CONTRACT   Imported By: Louretta Parma 03/02/2010 16:49:21  _____________________________________________________________________  External Attachment:    Type:   Image     Comment:   External Document

## 2010-07-28 NOTE — Assessment & Plan Note (Signed)
Summary: CHECKUP/SB.   Vital Signs:  Patient profile:   65 year old male Height:      74 inches (187.96 cm) Weight:      269.6 pounds (128 kg) BMI:     34.74 Temp:     97.8 degrees F (36.56 degrees C) oral Pulse rate:   95 / minute BP sitting:   150 / 80  (right arm) Cuff size:   large  Vitals Entered By: Theotis Barrio NT II (June 03, 2010 2:05 PM) CC: PAIN MEDICATION REFILL / CHRONIC RIGHT LEG PAIN /  Is Patient Diabetic? No Pain Assessment Patient in pain? yes     Location: RIGHT LEG Intensity:      7 Type: SHARP/NUMBNESS Onset of pain  Chronic Nutritional Status BMI of > 30 = obese  Have you ever been in a relationship where you felt threatened, hurt or afraid?No   Does patient need assistance? Functional Status Self care Ambulation Normal   Primary [facility]:  Clerance Lav MD  CC:  PAIN MEDICATION REFILL / CHRONIC RIGHT LEG PAIN / .  History of Present Illness: Pt is a 65 yo M with PMHx of HTN, aortic stenosis, HLD, lumbar spinal stenosis who presents to clinic today with multiple medical concerns, including the following:   1) Chronic back pain - since at least 2002, per record review. MRI 2002 showed DDD and facet disease, spinal stenosis at L4-5. Today pt describes his pain a 5-6/10 dull pain, not progressing in severity. Worse with activity, alleviated by sitting, Vicodin. Occasional shooting pain down lateral aspect of legs. No numbness or tingling, no weakness of legs, no saddle anesthesia. stable otherwise from last visit  2) Right shoulder pain - ongoing since at least 10/2009, no known injury or trauma. 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 Was evaluated by Sports medicine 11/2009, recommended  physical therapy, and RTC in 1 month for reevaluation. now much better  3) HTN - Patient does not check blood pressure regularly at home. On HCTZ, lisinopril max dosed. Started on norvasc 5 mg on the last visit. He does  not report any pedal edema.    Preventive Screening-Counseling & Management  Alcohol-Tobacco     Alcohol type: beer and liquor - only on weekends     Smoking Status: current     Smoking Cessation Counseling: yes     Packs/Day: 1 pack/ week     Year Started: AT THE AGE OF 16  Caffeine-Diet-Exercise     Does Patient Exercise: yes     Type of exercise: WALKING     Exercise (avg: min/session): 15     Times/week: 3  Current Medications (verified): 1)  Hydrochlorothiazide 25 Mg Tabs (Hydrochlorothiazide) .... Take 1 Tablet By Mouth Once A Day 2)  Aspirin 81 Mg Tbec (Aspirin) .... Take 1 Tablet By Mouth Once A Day 3)  Prilosec 40 Mg  Cpdr (Omeprazole) .... Take 1 Tablet By Mouth Once A Day 4)  Oxycodone Hcl 5 Mg Tabs (Oxycodone Hcl) .... Take 1 Tablet Every 6 Hours As Needed For Pain. 5)  Nitroglycerin Cr 2.5 Mg  Cr-Caps (Nitroglycerin) .... As Needed 6)  Lisinopril 40 Mg Tabs (Lisinopril) .... Take One Table By Mouth Daily 7)  Ventolin Hfa 108 (90 Base) Mcg/act Aers (Albuterol Sulfate) .... Take One To Two Puffs Every Four To Six Hours As Needed. 8)  Norvasc 5 Mg Tabs (Amlodipine Besylate) .... Take 1 Tablet By Mouth Once A Day  Allergies (verified): No Known Drug Allergies  Past History:  Past Medical History: Last updated: 04/08/2010 HTN Chronic back pain-- MRI (2002) with  L2-3 degenerative chages, disc bulge and mild facet & ligamentous hypertrophy combine mild multifactorial stenosis. 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 Mild Aortic stenosis - aortic valve area 1.53cm sq 9/07 from 1.71cmsq 2004 Mild dilated left atrium Tobacco abuse prior admits for chest pain.  Chronic chest pain    negative cath 2003    negative stress myoview 2008    Unchanged echo 7/07 with EF 65%      Family History: Last updated: 2006-11-03 Mother deceased at age 10s from CAD Father deceased age 67 from  colon cancer 9 brothers, 1 deceased from drowning accident.- 2 brothers on dialysis 2 sisters with HTN, DM  Social History: Last updated: 10/27/2009 Lives in [place] with girlfriend. Tobacco abuse, etoh, No IVDU. 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 [name] (first cousin) - [phone]  Risk Factors: Exercise: yes (06/03/2010)  Risk Factors: Smoking Status: current (06/03/2010) Packs/Day: 1 pack/ week (06/03/2010)  Review of Systems      See HPI  Physical Exam  General:  General: Vital signs reviewed and noted. Well-developed,well-nourished, in no acute distress; alert,appropriate and cooperative throughout examination. Head: normocephalic, atraumatic. Lungs: Normal respiratory effort. Clear to auscultation BL without crackles or wheezes.  Heart: RRR. Grade 3/6 systolic murmur with  radiation to the left axilla and without radiation to b/l carotids.   Abdomen: BS normoactive.  Extremities: 1-2+ pretibial edema. No apparent deformity of spine. Normal FROM of back and shoulders.    Head:  normocephalic and atraumatic.   Eyes:  vision grossly intact, pupils equal, pupils round, and pupils reactive to light.   Ears:  no external deformities.   Nose:  no external deformity.   Mouth:  pharynx pink and moist.   Neck:  supple.   Lungs:  normal breath sounds, no crackles, and no wheezes.   Heart:  normal rate, regular rhythm, no gallop, and no rub. Grade 4/6 systolic murmur throughout precordium that radiates to the left axilla and doesnot radiate to b/l carotids.   Abdomen:  Bowel sounds positive,abdomen soft and non-tender without masses, organomegaly or hernias noted. Msk:  decreased joint motion in right shoulder. neck is stiff. localised tenderness over right shoulder and both knees.    Neurologic:  alert & oriented X3.   Skin:  no suspicious lesions.   Psych:  Oriented X3, memory intact for recent and remote, and slightly anxious.     Impression & Recommendations:  Problem # 1:  BACK  PAIN, LUMBAR, CHRONIC (ICD-724.2) stable. Continued on pain medications. He was able to get oxycodone as prescribed.  His updated medication list for this problem includes:    Aspirin 81 Mg Tbec (Aspirin) .[name]... Take 1 tablet by mouth once a day    Oxycodone Hcl 5 Mg Tabs (Oxycodone hcl) .[name]... Take 1 tablet every 6 hours as needed for pain.  Problem # 2:  SHOULDER PAIN, RIGHT (ICD-719.41) stable. Continue with the present pain regimen.  His updated medication list for this problem includes:    Aspirin 81 Mg Tbec (Aspirin) .[name]... Take 1 tablet by mouth once a day    Oxycodone Hcl 5 Mg Tabs (Oxycodone hcl) .[name]... Take 1 tablet every 6 hours as needed for pain.  Problem # 3:  HYPERLIPIDEMIA (ICD-272.4) His LDL has been at goal. So at this time,  I would like to remove the diagnosis of hyperlipidemia. He also is not taking any statins.  Labs Reviewed: SGOT: 20 (12/09/2009)   SGPT: 15 (12/09/2009)   HDL:51 (12/09/2009), 46 (04/17/2009)  LDL:91 (12/09/2009), 89 (04/17/2009)  Chol:155 (12/09/2009), 159 (04/17/2009)  Trig:66 (12/09/2009), 118 (04/17/2009)  Problem # 4:  HYPERTENSION (ICD-401.9) BP stable but not at the target. I will increase the norvasc dose to 10 mg per day.  His updated medication list for this problem includes:    Hydrochlorothiazide 25 Mg Tabs (Hydrochlorothiazide) .[name]... Take 1 tablet by mouth once a day    Lisinopril 40 Mg Tabs (Lisinopril) .[name]... Take one table by mouth daily    Norvasc 10 Mg Tabs (Amlodipine besylate) ..... Once daily  BP today: 150/80 Prior BP: 148/79 (04/08/2010)  Labs Reviewed: K+: 4.3 (12/09/2009) Creat: : 0.99 (12/09/2009)   Chol: 155 (12/09/2009)   HDL: 51 (12/09/2009)   LDL: 91 (12/09/2009)   TG: 66 (12/09/2009)  Problem # 5:  TOBACCO ABUSE (ICD-305.1)  Encouraged smoking cessation and discussed different methods for smoking cessation.   Complete Medication List: 1)  Hydrochlorothiazide 25 Mg Tabs (Hydrochlorothiazide) .... Take 1 tablet by  mouth once a day 2)  Aspirin 81 Mg Tbec (Aspirin) .... Take 1 tablet by mouth once a day 3)  Prilosec 40 Mg Cpdr (Omeprazole) .... Take 1 tablet by mouth once a day 4)  Oxycodone Hcl 5 Mg Tabs (Oxycodone hcl) .... Take 1 tablet every 6 hours as needed for pain. 5)  Nitroglycerin Cr 2.5 Mg Cr-caps (Nitroglycerin) .... As needed 6)  Lisinopril 40 Mg Tabs (Lisinopril) .... Take one table by mouth daily 7)  Ventolin Hfa 108 (90 Base) Mcg/act Aers (Albuterol sulfate) .... Take one to two puffs every four to six hours as needed. 8)  Norvasc 10 Mg Tabs (Amlodipine besylate) .... Once daily  Patient Instructions: 1)  Please schedule a follow-up appointment in 1 month. Prescriptions: OXYCODONE HCL 5 MG TABS (OXYCODONE HCL) take 1 tablet every 6 hours as needed for pain.  #120 x 0   Entered and Authorized by:   Clerance Lav MD   Signed by:   Clerance Lav MD on 06/03/2010   Method used:   Print then Give to Patient   RxID:   [id number] NORVASC 10 MG TABS (AMLODIPINE BESYLATE) once daily  #30 x 5   Entered and Authorized by:   Clerance Lav MD   Signed by:   Clerance Lav MD on 06/03/2010   Method used:   Electronically to        [facility]  [address] [phone]* (retail)       [address]       [place], [place]  [id number]       Ph: [phone] or [phone]       Fax: [phone]   RxID:   [id number]    Orders Added: 1)  Est. Patient Level IV [43329]     Prevention & Chronic Care Immunizations   Influenza vaccine: Fluvax MCR  (02/23/2010)   Influenza vaccine deferral: Deferred  (11/25/2009)   Influenza vaccine due: 02/25/2011    Tetanus booster: Not documented   Td booster deferral: Refused  (03/25/2010)    Pneumococcal vaccine: Pneumovax  (07/06/2008)   Pneumococcal vaccine deferral: Deferred  (11/25/2009)   Pneumococcal vaccine due: 07/06/2013    H. zoster vaccine: Not documented   H. zoster vaccine deferral: Deferred   (11/25/2009)  Colorectal Screening  Hemoccult: Not documented   Hemoccult action/deferral: Ordered  (03/25/2010)    [facility]    ([facility]/24/[facility])   Colonoscopy action/deferral: Deferred  (11/25/2009)   Colonoscopy due: 03/2013  Other Screening   PSA: Not documented   PSA action/deferral: Discussed-PSA declined  (02/19/2009)   Smoking status: current  (06/03/2010)   Smoking cessation counseling: yes  (06/03/2010)  Lipids   Total Cholesterol: 155  (12/09/2009)   Lipid panel action/deferral: Lipid Panel ordered   LDL: 91  (12/09/2009)   LDL Direct: Not documented   HDL: 51  (12/09/2009)   Triglycerides: 66  (12/09/2009)    SGOT (AST): 20  (12/09/2009)   BMP action: Ordered   SGPT (ALT): 15  (12/09/2009)   Alkaline phosphatase: 51  (12/09/2009)   Total bilirubin: 0.4  (12/09/2009)    Lipid flowsheet reviewed?: Yes   Progress toward LDL goal: Unchanged  Hypertension   Last Blood Pressure: 150 / 80  (06/03/2010)   Serum creatinine: 0.99  (12/09/2009)   BMP action: Ordered   Serum potassium 4.3  (12/09/2009)    Hypertension flowsheet reviewed?: Yes   Progress toward BP goal: Unchanged  Self-Management Support :   Personal Goals (by the next clinic visit) :      Personal blood pressure goal: 140/90  (02/19/2009)     Personal LDL goal: 100  (02/19/2009)    Patient will work on the following items until the next clinic visit to reach self-care goals:     Medications and monitoring: take my medicines every day  (06/03/2010)     Eating: eat foods that are low in salt, eat baked foods instead of fried foods, limit or avoid alcohol  (06/03/2010)     Activity: take a 30 minute walk every day  (06/03/2010)    Hypertension self-management support: Resources for patients handout, Written self-care plan  (06/03/2010)   Hypertension self-care plan printed.    Lipid self-management support: Resources for patients handout  (06/03/2010)         Resource handout printed.

## 2010-07-28 NOTE — Letter (Signed)
Summary: [facility]: Dr. Mitzi Davenport  [facility]: Dr. Mitzi Davenport   Imported By: Florinda Marker 07/22/2009 11:35:50  _____________________________________________________________________  External Attachment:    Type:   Image     Comment:   External Document

## 2010-07-28 NOTE — Progress Notes (Signed)
Summary: phone/gg  Phone Note Call from Patient   Summary of Call: Call from pt.  stating his insurance will not cover his pain med oxycontin 20 MG XR12H-TAB (OXYCODONE HCL) Take one tablet twice daily  #90. They will cover oxycodone or percocet.  Will you change?  He has the old Rx he will bring in from 10/14 Initial call taken by: Merrie Roof RN,  April 25, 2010 2:06 PM  Follow-up for Phone Call        Is pt using vicodin?  Seems it was being filled regularly, last provided 9/28.  If so, is it helping and does insurance cover it? Follow-up by: Mariea Stable MD,  April 25, 2010 2:28 PM  Additional Follow-up for Phone Call Additional follow up Details #1::        Yes, he uses vicodin because oxy is not covered by insurance and has not been filled..  Vicodin does not really help.  Additional Follow-up by: Merrie Roof RN,  April 25, 2010 2:36 PM    Additional Follow-up for Phone Call Additional follow up Details #2::    Spoke with [facility].  Pt has used vicodin in recent past and has been unable to afford oxycontin as it is not covered by insurance.  Inocencio Homes spoke with pharmacy who states insurance will cover oxycodone and percocet but not long acting preparation.  In light of this, will write for oxycodone to avoid excess tylenol.  Pt is to bring prescription for oxycontin to void (and have scanned subsequently).  Med list updated Follow-up by: Mariea Stable MD,  April 25, 2010 2:51 PM  New/Updated Medications: OXYCODONE HCL 5 MG TABS (OXYCODONE HCL) take 1 tablet every 6 hours as needed for pain. Prescriptions: OXYCODONE HCL 5 MG TABS (OXYCODONE HCL) take 1 tablet every 6 hours as needed for pain.  #120 x 0   Entered and Authorized by:   Mariea Stable MD   Signed by:   Mariea Stable MD on 04/25/2010   Method used:   Print then Give to Patient   RxID:   [id number]

## 2010-07-28 NOTE — Assessment & Plan Note (Signed)
Summary: acute-back pain/cfb   Vital Signs:  Patient profile:   65 year old male Height:      74 inches (187.96 cm) Weight:      290.3 pounds (131.95 kg) BMI:     37.41 Temp:     97.4 degrees F (36.33 degrees C) oral Pulse rate:   84 / minute BP sitting:   157 / 87  (left arm) CC: check-up/evaluate right shoulder pain and left leg pain and swelling/medication refills Is Patient Diabetic? No Pain Assessment Patient in pain? yes     Location: right shoulder/back Intensity: 5/3 Type: sharp/aching Onset of pain  2-3 weeks ago, off and on Nutritional Status BMI of > 30 = obese  Have you ever been in a relationship where you felt threatened, hurt or afraid?No   Does patient need assistance? Functional Status Self care Ambulation Impaired:Risk for fall Comments walks with cane   Primary Care Provider:  Clerance Lav MD  CC:  check-up/evaluate right shoulder pain and left leg pain and swelling/medication refills.  History of Present Illness: [name] is a 65 yo man with PMH as outlined in the EMR comes today for following.  1. Back pain: The back pain is stable with vicodin.   2. HTN: Sometimes he forgets to take his BP medicine, but recently he takes it regularly.   3. R. Shoulder pain: It started 2-3 weeks ago when he woke up in the morning from sleep. No trauma. It has persisted. No aggravating factor and is releived by vicodin. No neck pain. No radiation of the pain. No numbness/tingling/weakness of the right arm. No fever/chills. On average he takes 2 ibuprofen in a day.   Preventive Screening-Counseling & Management  Alcohol-Tobacco     Alcohol type: beer and liquor - only on weekends     Smoking Status: current     Smoking Cessation Counseling: yes     Packs/Day: 1 pack/ week     Year Started: AT THE AGE OF 16  Caffeine-Diet-Exercise     Does Patient Exercise: no     Type of exercise: WALKING     Exercise (avg: min/session): 15     Times/week: 3  Current  Medications (verified): 1)  Hydrochlorothiazide 25 Mg Tabs (Hydrochlorothiazide) .... Take 1 Tablet By Mouth Once A Day 2)  Aspirin 81 Mg Tbec (Aspirin) .... Take 1 Tablet By Mouth Once A Day 3)  Prilosec 40 Mg  Cpdr (Omeprazole) .... Take 1 Tablet By Mouth Once A Day 4)  Vicodin 5-500 Mg  Tabs (Hydrocodone-Acetaminophen) .... One Tablet Every 4 Hours As Needed For Pain. 5)  Nitroglycerin Cr 2.5 Mg  Cr-Caps (Nitroglycerin) .... As Needed 6)  Lisinopril 40 Mg Tabs (Lisinopril) .... Take One Table By Mouth Daily 7)  Ventolin Hfa 108 (90 Base) Mcg/act Aers (Albuterol Sulfate) .... Take One To Two Puffs Every Four To Six Hours As Needed. 8)  Ibuprofen 800 Mg Tabs (Ibuprofen) .... Take One Tablet Every Six Hours As Needed For Rib Pain  Allergies: No Known Drug Allergies  Review of Systems      See HPI  Physical Exam  Mouth:  pharynx pink and moist.   Lungs:  normal breath sounds, no crackles, and no wheezes.   Heart:  normal rate, regular rhythm, no gallop, and no rub. Grade 4/6 systolic murmur throughout precordium that radiates to the left axilla and doesnot radiate to b/l carotids.   Msk:  R. shoulder: strength is normal, light touch sensation is normal.  Active abduction is limited to 90 degree from pain. Otherwise ROM is wnl.  Extremities:  trace left pedal edema and trace right pedal edema.   Neurologic:  alert & oriented X3.     Impression & Recommendations:  Problem # 1:  SHOULDER PAIN, RIGHT (ICD-719.41) This seems to be rotator cuff injury. We had limited active abduction of right shoulder from pain. Will refer to sports medicine to see if he benefits from any steroid injection. Will continue his vicodin and as needed ibuprofen.   His updated medication list for this problem includes:    Aspirin 81 Mg Tbec (Aspirin) .[name]... Take 1 tablet by mouth once a day    Vicodin 5-500 Mg Tabs (Hydrocodone-acetaminophen) ..... One tablet every 4 hours as needed for pain.    Ibuprofen 800 Mg  Tabs (Ibuprofen) .[name]... Take one tablet every six hours as needed for rib pain  Orders: Diagnostic X-Ray/Fluoroscopy (Diagnostic X-Ray/Flu) Sports Medicine (Sports Med)  Problem # 2:  CARDIAC MURMUR (ICD-785.2) I did not see this as a separate problem list. He had mild AS based on 65 yr old echo. His murmur today sounds like from MR. Will repeat ECHO.   Orders: 2 D Echo (2 D Echo)  Problem # 3:  LEG EDEMA, BILATERAL (ICD-782.3) Will check CMET and ECHO.   His updated medication list for this problem includes:    Hydrochlorothiazide 25 Mg Tabs (Hydrochlorothiazide) .[name]... Take 1 tablet by mouth once a day  Problem # 4:  HYPERLIPIDEMIA (ICD-272.4) WIll cont same and check following.   Orders: T-CMP with estimated GFR ([id number]) T-Lipid Profile [phone]) T-TSH [phone])  Labs Reviewed: SGOT: 23 (04/17/2009)   SGPT: 15 (04/17/2009)   HDL:46 (04/17/2009), 69 (05/04/2008)  LDL:89 (04/17/2009), 99 (05/04/2008)  Chol:159 (04/17/2009), 201 (05/04/2008)  Trig:118 (04/17/2009), 166 (05/04/2008)  Problem # 5:  HYPERTENSION (ICD-401.9) He has some pain in his right shoulder, so I will not change his meds. I noted that except today and on last visit, his BP is usu controlled well.   His updated medication list for this problem includes:    Hydrochlorothiazide 25 Mg Tabs (Hydrochlorothiazide) .[name]... Take 1 tablet by mouth once a day    Lisinopril 40 Mg Tabs (Lisinopril) .[name]... Take one table by mouth daily  BP today: 157/87 Prior BP: 150/88 (10/27/2009)  Labs Reviewed: K+: 4.5 (04/17/2009) Creat: : 1.10 (04/17/2009)   Chol: 159 (04/17/2009)   HDL: 46 (04/17/2009)   LDL: 89 (04/17/2009)   TG: 118 (04/17/2009)  Complete Medication List: 1)  Hydrochlorothiazide 25 Mg Tabs (Hydrochlorothiazide) .... Take 1 tablet by mouth once a day 2)  Aspirin 81 Mg Tbec (Aspirin) .... Take 1 tablet by mouth once a day 3)  Prilosec 40 Mg Cpdr (Omeprazole) .... Take 1 tablet by mouth once a day 4)   Vicodin 5-500 Mg Tabs (Hydrocodone-acetaminophen) .... One tablet every 4 hours as needed for pain. 5)  Nitroglycerin Cr 2.5 Mg Cr-caps (Nitroglycerin) .... As needed 6)  Lisinopril 40 Mg Tabs (Lisinopril) .... Take one table by mouth daily 7)  Ventolin Hfa 108 (90 Base) Mcg/act Aers (Albuterol sulfate) .... Take one to two puffs every four to six hours as needed. 8)  Ibuprofen 800 Mg Tabs (Ibuprofen) .... Take one tablet every six hours as needed for rib pain  Patient Instructions: 1)  Please schedule a follow-up appointment in 1 month. 2)  You have to come to lab in the morning fasting to check for some labs.  3)  Limit your  Sodium (Salt) to less than 2 grams a day(slightly less than 1/2 a teaspoon) to prevent fluid retention, swelling, or worsening of symptoms. 4)  Tobacco is very bad for your health and your loved ones! You Should stop smoking!. 5)  Stop Smoking Tips: Choose a Quit date. Cut down before the Quit date. decide what you will do as a substitute when you feel the urge to smoke(gum,toothpick,exercise). 6)  It is important that you exercise regularly at least 20 minutes 5 times a week. If you develop chest pain, have severe difficulty breathing, or feel very tired , stop exercising immediately and seek medical attention. 7)  You need to lose weight. Consider a lower calorie diet and regular exercise.  8)  Check your Blood Pressure regularly. If it is above: you should make an appointment. Process Orders Check Orders Results:     [facility]: Check successful Tests Sent for requisitioning (November 25, 2009 4:32 PM):     11/25/2009: [facility] -- T-Lipid Profile [phone] (signed)     11/25/2009: [facility] -- T-TSH [phone] (signed)    Prevention & Chronic Care Immunizations   Influenza vaccine: Fluvax 3+  (03/19/2009)   Influenza vaccine deferral: Deferred  (11/25/2009)    Tetanus booster: Not documented   Td booster  deferral: Deferred  (02/19/2009)    Pneumococcal vaccine: Pneumovax  (07/06/2008)   Pneumococcal vaccine deferral: Deferred  (11/25/2009)   Pneumococcal vaccine due: 07/06/2013    H. zoster vaccine: Not documented   H. zoster vaccine deferral: Deferred  (11/25/2009)  Colorectal Screening   Hemoccult: Not documented   Hemoccult action/deferral: Deferred  (02/19/2009)    Colonoscopy: Location:  [facility].    (03/24/2008)   Colonoscopy action/deferral: Deferred  (11/25/2009)   Colonoscopy due: 03/2013  Other Screening   PSA: Not documented   PSA action/deferral: Discussed-PSA declined  (02/19/2009)   Smoking status: current  (11/25/2009)   Smoking cessation counseling: yes  (11/25/2009)  Lipids   Total Cholesterol: 159  (04/17/2009)   Lipid panel action/deferral: Lipid Panel ordered   LDL: 89  (04/17/2009)   LDL Direct: Not documented   HDL: 46  (04/17/2009)   Triglycerides: 118  (04/17/2009)    SGOT (AST): 23  (04/17/2009)   BMP action: Ordered   SGPT (ALT): 15  (04/17/2009)   Alkaline phosphatase: 45  (04/17/2009)   Total bilirubin: 0.8  (04/17/2009)    Lipid flowsheet reviewed?: Yes   Progress toward LDL goal: Unchanged  Hypertension   Last Blood Pressure: 157 / 87  (11/25/2009)   Serum creatinine: 1.10  (04/17/2009)   BMP action: Ordered   Serum potassium 4.5  (04/17/2009)    Hypertension flowsheet reviewed?: Yes   Progress toward BP goal: Unchanged  Self-Management Support :   Personal Goals (by the next clinic visit) :      Personal blood pressure goal: 140/90  (02/19/2009)     Personal LDL goal: 100  (02/19/2009)    Patient will work on the following items until the next clinic visit to reach self-care goals:     Medications and monitoring: take my medicines every day, check my blood pressure  (11/25/2009)     Eating: eat more vegetables, eat foods that are low in salt, eat baked foods instead of fried foods, eat fruit for snacks and  desserts  (11/25/2009)     Activity: take a 30 minute walk every day  (11/25/2009)    Hypertension self-management support: Pre-printed educational material,  Written self-care plan, Resources for patients handout  (11/25/2009)   Hypertension self-care plan printed.    Lipid self-management support: Pre-printed educational material, Written self-care plan, Resources for patients handout  (11/25/2009)   Lipid self-care plan printed.      Resource handout printed.   Process Orders Check Orders Results:     [facility]: Check successful Tests Sent for requisitioning (November 25, 2009 4:32 PM):     11/25/2009: [facility] -- T-Lipid Profile [phone] (signed)     11/25/2009: [facility] -- T-TSH [phone] (signed)

## 2010-07-28 NOTE — Assessment & Plan Note (Signed)
Summary: 1 MONTH F/U PER PCP [facility]   Vital Signs:  Patient profile:   65 year old male Height:      74 inches (187.96 cm) Weight:      283.4 pounds (128.82 kg) BMI:     36.52 O2 Sat:      100 % on Room air Temp:     97.1 degrees F oral Pulse rate:   69 / minute BP sitting:   131 / 70  (right arm) Cuff size:   large  Vitals Entered By: Chinita Pester RN (March 25, 2010 10:21 AM)  O2 Flow:  Room air CC: F/U visit.  Right leg/back pain; needs something else for pain - states Oxycontin not covered by Medicare.  Request home care services. Is Patient Diabetic? No Pain Assessment Patient in pain? yes     Location: right leg/back Intensity: 8 Type: sharp Onset of pain  Chronic Nutritional Status BMI of > 30 = obese  Have you ever been in a relationship where you felt threatened, hurt or afraid?No   Does patient need assistance? Functional Status Self care Ambulation Impaired:Risk for fall Comments uses a cane.   Primary Care Matina Rodier:  Clerance Lav MD  CC:  F/U visit.  Right leg/back pain; needs something else for pain - states Oxycontin not covered by Medicare.  Request home care services..  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   He has been recently reastarted on vicodin and oxyxcontin with pain contract in palce. His insurance denies oxycontin based on pre-authorization approval need. he conitnue to take vicodin.   he denies any other new complaionts. He request HHA   Depression History:      The patient denies a depressed mood most of the day and a diminished interest in his usual daily activities.         Preventive Screening-Counseling & Management  Alcohol-Tobacco     Alcohol type: beer and liquor - only on weekends     Smoking Status: current     Smoking Cessation Counseling: yes     Packs/Day: 1 pack/ week    Year Started: AT THE AGE OF 16  Caffeine-Diet-Exercise     Does Patient Exercise: no     Type of exercise: WALKING     Exercise (avg: min/session): 15     Times/week: 3  Current Medications (verified): 1)  Hydrochlorothiazide 25 Mg Tabs (Hydrochlorothiazide) .... Take 1 Tablet By Mouth Once A Day 2)  Aspirin 81 Mg Tbec (Aspirin) .... Take 1 Tablet By Mouth Once A Day 3)  Prilosec 40 Mg  Cpdr (Omeprazole) .... Take 1 Tablet By Mouth Once A Day 4)  Vicodin 5-500 Mg  Tabs (Hydrocodone-Acetaminophen) .... One Tablet Every 4 Hours As Needed For Pain. 5)  Nitroglycerin Cr 2.5 Mg  Cr-Caps (Nitroglycerin) .... As Needed 6)  Lisinopril 40 Mg Tabs (Lisinopril) .... Take One Table By Mouth Daily 7)  Ventolin Hfa 108 (90 Base) Mcg/act Aers (Albuterol Sulfate) .... Take One To Two Puffs Every Four To Six Hours As Needed. 8)  Oxycontin 20 Mg Xr12h-Tab (Oxycodone Hcl) .... Take One Tablet Twice Daily  Allergies (verified): No Known Drug Allergies  Review of Systems  The patient denies anorexia, fever, weight loss, weight gain, vision loss, decreased hearing, hoarseness, chest pain, syncope, dyspnea on exertion, peripheral edema, prolonged cough, headaches, hemoptysis, abdominal pain,  melena, hematochezia, severe indigestion/heartburn, hematuria, incontinence, genital sores, muscle weakness, suspicious skin lesions, transient blindness, difficulty walking, depression, unusual weight change, abnormal bleeding, enlarged lymph nodes, angioedema, breast masses, and testicular masses.    Physical Exam  General:  Well-developed,well-nourished,in no acute distress; alert,appropriate and cooperative throughout examination Head:  normocephalic and atraumatic.   Eyes:  vision grossly intact, pupils equal, pupils round, and pupils reactive to light.   Ears:  no external deformities.   Nose:  no external deformity.   Mouth:  pharynx pink and moist.   Neck:  supple.   Lungs:  normal breath sounds, no  crackles, and no wheezes.   Heart:  normal rate, regular rhythm, no gallop, and no rub. Grade 4/6 systolic murmur throughout precordium that radiates to the left axilla and doesnot radiate to b/l carotids.   Abdomen:  Bowel sounds positive,abdomen soft and non-tender without masses, organomegaly or hernias noted. Msk:  decreased joint motion in right shoulder. neck is stiff. localised tenderness over right shoulder and both knees.    Pulses:  R radial normal.   Extremities:  trace left pedal edema and trace right pedal edema.   Neurologic:  alert & oriented X3.     Impression & Recommendations:  Problem # 1:  HIP PAIN, RIGHT (ICD-719.45)  will ask Glenda to help with the insurance requirement with the oxycontin prescription. patient has brought the prescription with him. will not change medication today. has recenlty got a refill on vicodin. wil give a referral for HHA/   His updated medication list for this problem includes:    Aspirin 81 Mg Tbec (Aspirin) .[name]... Take 1 tablet by mouth once a day    Vicodin 5-500 Mg Tabs (Hydrocodone-acetaminophen) ..... One tablet every 4 hours as needed for pain.    Oxycontin 20 Mg Xr12h-tab (Oxycodone hcl) .[name]... Take one tablet twice daily  Orders: Home Health Referral [facility])  Discussed elevation of the legs, use of compression stockings, sodium restiction, and medication use.   Discussed use of medications, application of heat or cold, and exercises.   Problem # 2:  HYPERLIPIDEMIA (ICD-272.4) at goal as per last FLP in 6/11.   Labs Reviewed: SGOT: 20 (12/09/2009)   SGPT: 15 (12/09/2009)   HDL:51 (12/09/2009), 46 (04/17/2009)  LDL:91 (12/09/2009), 89 (04/17/2009)  Chol:155 (12/09/2009), 159 (04/17/2009)  Trig:66 (12/09/2009), 118 (04/17/2009)  Problem # 3:  HYPERTENSION (ICD-401.9) well controlled.   His updated medication list for this problem includes:    Hydrochlorothiazide 25 Mg Tabs (Hydrochlorothiazide) .[name]... Take 1 tablet by  mouth once a day    Lisinopril 40 Mg Tabs (Lisinopril) .[name]... Take one table by mouth daily  BP today: 131/70 Prior BP: 148/87 (02/23/2010)  Labs Reviewed: K+: 4.3 (12/09/2009) Creat: : 0.99 (12/09/2009)   Chol: 155 (12/09/2009)   HDL: 51 (12/09/2009)   LDL: 91 (12/09/2009)   TG: 66 (12/09/2009)  Complete Medication List: 1)  Hydrochlorothiazide 25 Mg Tabs (Hydrochlorothiazide) .... Take 1 tablet by mouth once a day 2)  Aspirin 81 Mg Tbec (Aspirin) .... Take 1 tablet by mouth once a day 3)  Prilosec 40 Mg Cpdr (Omeprazole) .... Take 1 tablet by mouth once a day 4)  Vicodin 5-500 Mg Tabs (Hydrocodone-acetaminophen) .... One tablet every 4 hours as needed for pain. 5)  Nitroglycerin Cr 2.5 Mg Cr-caps (Nitroglycerin) .... As needed 6)  Lisinopril 40 Mg Tabs (Lisinopril) .... Take one table by mouth daily 7)  Ventolin Hfa 108 (90 Base) Mcg/act Aers (Albuterol sulfate) .Marland KitchenMarland KitchenMarland Kitchen  Take one to two puffs every four to six hours as needed. 8)  Oxycontin 20 Mg Xr12h-tab (Oxycodone hcl) .... Take one tablet twice daily  Other Orders: T-Hemoccult Card-Multiple (take home) ([id number])  Patient Instructions: 1)  Please schedule a follow-up appointment in 1 month.  Prevention & Chronic Care Immunizations   Influenza vaccine: Fluvax MCR  (02/23/2010)   Influenza vaccine deferral: Deferred  (11/25/2009)   Influenza vaccine due: 02/25/2011    Tetanus booster: Not documented   Td booster deferral: Refused  (03/25/2010)    Pneumococcal vaccine: Pneumovax  (07/06/2008)   Pneumococcal vaccine deferral: Deferred  (11/25/2009)   Pneumococcal vaccine due: 07/06/2013    H. zoster vaccine: Not documented   H. zoster vaccine deferral: Deferred  (11/25/2009)  Colorectal Screening   Hemoccult: Not documented   Hemoccult action/deferral: Ordered  (03/25/2010)    Colonoscopy: Location:  [facility].    (03/24/2008)   Colonoscopy action/deferral: Deferred  (11/25/2009)   Colonoscopy due:  03/2013  Other Screening   PSA: Not documented   PSA action/deferral: Discussed-PSA declined  (02/19/2009)   Smoking status: current  (03/25/2010)   Smoking cessation counseling: yes  (03/25/2010)  Lipids   Total Cholesterol: 155  (12/09/2009)   Lipid panel action/deferral: Lipid Panel ordered   LDL: 91  (12/09/2009)   LDL Direct: Not documented   HDL: 51  (12/09/2009)   Triglycerides: 66  (12/09/2009)    SGOT (AST): 20  (12/09/2009)   BMP action: Ordered   SGPT (ALT): 15  (12/09/2009)   Alkaline phosphatase: 51  (12/09/2009)   Total bilirubin: 0.4  (12/09/2009)    Lipid flowsheet reviewed?: Yes   Progress toward LDL goal: At goal  Hypertension   Last Blood Pressure: 131 / 70  (03/25/2010)   Serum creatinine: 0.99  (12/09/2009)   BMP action: Ordered   Serum potassium 4.3  (12/09/2009)    Hypertension flowsheet reviewed?: Yes   Progress toward BP goal: Improved  Self-Management Support :   Personal Goals (by the next clinic visit) :      Personal blood pressure goal: 140/90  (02/19/2009)     Personal LDL goal: 100  (02/19/2009)    Patient will work on the following items until the next clinic visit to reach self-care goals:     Medications and monitoring: take my medicines every day, check my blood pressure, bring all of my medications to every visit  (03/25/2010)     Eating: eat more vegetables, use fresh or frozen vegetables, eat foods that are low in salt, eat baked foods instead of fried foods  (03/25/2010)     Activity: take a 30 minute walk every day, take the stairs instead of the elevator  (03/25/2010)    Hypertension self-management support: Written self-care plan  (03/25/2010)   Hypertension self-care plan printed.    Lipid self-management support: Written self-care plan  (03/25/2010)   Lipid self-care plan printed.   Nursing Instructions: Provide Hemoccult cards with instructions (see order)

## 2010-07-28 NOTE — Assessment & Plan Note (Signed)
Summary: Walk-in Triage  Pt walked into clinic with c/o Fall 5 days ago.  C/o pain and swelling to rt lower arm and hand, cut to mouth and he knocked out a tooth.  Pt is alert and with family member.  Denies LOC at time of fall.  Fall was caused from rt leg "giving out".   When asked pt states he is falling more frequently. We have no open slots today so after talking with Dr Meredith Pel pt was advise to go to ED now for evaluation of injuries.  Also asked to make appointment in clinic for evaluation of frequent falls.  Patient/caller verbalizes understanding of these instructions.  Merrie Roof RN  May 06, 2010 11:26 AM

## 2010-07-28 NOTE — Progress Notes (Signed)
Summary: med refill/gp  Phone Note Refill Request Message from:  Fax from Pharmacy on January 26, 2010 2:23 PM  Refills Requested: Medication #1:  VICODIN 5-500 MG  TABS one tablet every 4 hours as needed for pain.   Last Refilled: 12/31/2009 LAst appt. June 2.   Method Requested: Electronic Initial call taken by: Chinita Pester RN,  January 26, 2010 2:23 PM  Follow-up for Phone Call        This was last filled 12/30/09, and can be called in 8/5 to be filled 01/29/10, since 01/30/10 is a sunday. Follow-up by: Wynne E Woodyear MD,  January 26, 2010 2:53 PM  Additional Follow-up for Phone Call Additional follow up Details #1::        Rx called to pharmacy- [facility] w/ instruction not to be refill until 01/29/10. Additional Follow-up by: Glenda Palmer RN,  January 27, 2010 9:52 AM    Prescriptions: VICODIN 5-500 MG  TABS (HYDROCODONE-ACETAMINOPHEN) one tablet every 4 hours as needed for pain.  #120 x 0   Entered and Authorized by:   Wynne E Woodyear MD   Signed by:   Wynne E Woodyear MD on 01/26/2010   Method used:   Telephoned to ...       [facility]  [id number] (retail)       [address]       [place], [place]  [id number]       Ph: [phone] or [phone]       Fax: [phone]   RxID:   [id number]

## 2010-07-28 NOTE — Progress Notes (Signed)
Summary: refill/ hla  Phone Note Refill Request Message from:  Patient on March 22, 2010 4:24 PM  Refills Requested: Medication #1:  VICODIN 5-500 MG  TABS one tablet every 4 hours as needed for pain.   Dosage confirmed as above?Dosage Confirmed   Last Refilled: 8/31 last visit 01/2010  Initial call taken by: Marin Roberts RN,  March 22, 2010 4:24 PM  Follow-up for Phone Call       Follow-up by: Clerance Lav MD,  March 23, 2010 2:42 PM    Prescriptions: VICODIN 5-500 MG  TABS (HYDROCODONE-ACETAMINOPHEN) one tablet every 4 hours as needed for pain.  #120 x 0   Entered and Authorized by:   Clerance Lav MD   Signed by:   Clerance Lav MD on 03/23/2010   Method used:   Print then Give to Patient   RxID:   [id number]   Appended Document: refill/ hla Rx called in

## 2010-07-28 NOTE — Progress Notes (Signed)
  Phone Note Outgoing Call   Summary of Call: No answer at patient phone number.   Called Dallie Piles at [employer] who is going to check in on the patient and she or the patient will call me back.   Follow-up for Phone Call        [name] called back and said she had gotten in contact with the patient and he tends not to answer his phone.  She will try and get his cell phone.  Dorothe Pea  March 30, 2010 10:47 AM

## 2010-07-28 NOTE — Letter (Signed)
Summary: INDEPENDENT ASSESSMENT  INDEPENDENT ASSESSMENT   Imported By: Margie Billet 05/31/2010 11:26:18  _____________________________________________________________________  External Attachment:    Type:   Image     Comment:   External Document

## 2010-07-28 NOTE — Letter (Signed)
  [facility]  [address]   [place], [place] [address]   Phone: [phone]  Fax: [phone]    05/10/2010       LYDIA MENG [address] [place], [place]  [address]  Dear Mr. Opdahl,  I have been trying to reach you but unfortunately your phone keeps ringing with no answer.   We would like to try and order an in-home aide for you but would like to discuss this with you directly and obtain your permission to do so.   Also, we would need an alternate phone number so the nurse would be able to reach you to set up an appointment.   Please call me at [phone] so we can talk about the possibility of trying to obtain an in-home aide.   Sncerely,   Dorothe Pea, LCSW Clinical Social Worker [facility]

## 2010-07-28 NOTE — Progress Notes (Signed)
Summary: Inhome Aide  Phone Note Outgoing Call   Summary of Call: Discussed inhome aide with Mr. Sofranko and he agreed to have [place] order an assessment.   Cell is [phone]        Appended Document: Inhome Aide Faxed signed order by Dr. Rogelia Boga on 05/17/10.

## 2010-07-28 NOTE — Progress Notes (Signed)
Summary: refill/ hla  Phone Note Refill Request Message from:  Patient on December 29, 2009 4:55 PM  Refills Requested: Medication #1:  VICODIN 5-500 MG  TABS one tablet every 4 hours as needed for pain.   Dosage confirmed as above?Dosage Confirmed   Last Refilled: 6/2 Initial call taken by: Marin Roberts RN,  December 29, 2009 4:55 PM  Follow-up for Phone Call        Refill approved-nurse to complete Follow-up by: Clerance Lav MD,  December 30, 2009 9:31 PM    Prescriptions: VICODIN 5-500 MG  TABS (HYDROCODONE-ACETAMINOPHEN) one tablet every 4 hours as needed for pain.  #120 x 0   Entered and Authorized by:   Clerance Lav MD   Signed by:   Clerance Lav MD on 12/30/2009   Method used:   Telephoned to ...       [facility]  [address] [phone]* (retail)       [address]       [place], [place]  [id number]       Ph: [phone] or [phone]       Fax: [phone]   RxID:   [id number]   Appended Document: refill/ hla called to pharmacy

## 2010-07-28 NOTE — Assessment & Plan Note (Signed)
Summary: ACUTE-FELL AND HURT HAND-(SHAH)/CFB   Vital Signs:  Patient profile:   65 year old male Height:      74 inches (187.96 cm) Weight:      293.0 pounds (128.05 kg) BMI:     36.30 Temp:     99.0 degrees F (37.22 degrees C) oral Pulse rate:   89 / minute BP sitting:   150 / 88  (right arm)  Vitals Entered By: Theotis Barrio NT II (Oct 27, 2009 9:19 AM) CC: PATIENT STATES HE FELL OUT OF BED, HURT HAND BETTER NOW, RIGHT INDEX FINGER UNABLE TO FULLY BEND./ FELL ABOUT A WEEK AGO Is Patient Diabetic? No Pain Assessment Patient in pain? yes     Location: R/INDEX FINGER Intensity:       5 Type: ACHE Onset of pain  ABOUT 1 WEEK AGO Nutritional Status BMI of > 30 = obese  Have you ever been in a relationship where you felt threatened, hurt or afraid?No   Does patient need assistance? Functional Status Self care Ambulation Normal Comments FELL OUT OF BED AND HURT RIGHT INDEX FINGER, BETTER NOW, UNBLE TO FULLY BEND FINGER   Primary Care Provider:  Clerance Lav MD  CC:  PATIENT STATES HE FELL OUT OF BED, HURT HAND BETTER NOW, and RIGHT INDEX FINGER UNABLE TO FULLY BEND./ FELL ABOUT A WEEK AGO.  History of Present Illness: 65 yrs old male with PMH as mentioned in the EMR inclduing HTN, chronic back pain, DDD of lumbar spine, and spinal stenosis comes to the office for a regular office visit.  Patient reports that he fell out of bed while he was trying to go to the kitchen. He reports this is because of lack of balance. He fell on his right hand  and reports pain and mild swelling in his right 2nd finger. He denies any hitting head, LOC, dizziness or other neuro symptoms before/ duing/after the fall.  He denies any other complaints.  Preventive Screening-Counseling & Management  Alcohol-Tobacco     Alcohol type: beer and liquor - only on weekends     Smoking Status: current     Smoking Cessation Counseling: yes     Packs/Day: 1 pack/ week     Year Started: AT THE AGE OF  16  Caffeine-Diet-Exercise     Does Patient Exercise: no     Type of exercise: WALKING     Exercise (avg: min/session): 15     Times/week: 3  Problems Prior to Update: 1)  Acute Bronchitis  (ICD-466.0) 2)  Rib Pain, Right Sided  (ICD-786.50) 3)  Other Specified Visual Disturbances  (ICD-368.8) 4)  Headache  (ICD-784.0) 5)  Renal Cyst  (ICD-593.2) 6)  Hyperlipidemia  (ICD-272.4) 7)  Hip Pain, Right  (ICD-719.45) 8)  Hammer Toe  (ICD-755.66) 9)  Preventive Health Care  (ICD-V70.0) 10)  Gerd  (ICD-530.81) 11)  Chest Pain  (ICD-786.50) 12)  Tobacco Abuse  (ICD-305.1) 13)  Spinal Stenosis, Lumbar  (ICD-724.02) 14)  Atrial Enlargement, Left  (ICD-429.3) 15)  Aortic Stenosis, Mild  (ICD-424.1) 16)  Hypertension  (ICD-401.9)  Medications Prior to Update: 1)  Hydrochlorothiazide 25 Mg Tabs (Hydrochlorothiazide) .... Take 1 Tablet By Mouth Once A Day 2)  Aspirin 81 Mg Tbec (Aspirin) .... Take 1 Tablet By Mouth Once A Day 3)  Prilosec 40 Mg  Cpdr (Omeprazole) .... Take 1 Tablet By Mouth Once A Day 4)  Vicodin 5-500 Mg  Tabs (Hydrocodone-Acetaminophen) .... One Tablet Every 4 Hours As Needed  For Pain. 5)  Nitroglycerin Cr 2.5 Mg  Cr-Caps (Nitroglycerin) .... As Needed 6)  Lisinopril 40 Mg Tabs (Lisinopril) .... Take One Table By Mouth Daily 7)  Ventolin Hfa 108 (90 Base) Mcg/act Aers (Albuterol Sulfate) .... Take One To Two Puffs Every Four To Six Hours As Needed. 8)  Ibuprofen 800 Mg Tabs (Ibuprofen) .... Take One Tablet Every Six Hours As Needed For Rib Pain 9)  Doxycycline Hyclate 100 Mg Tabs (Doxycycline Hyclate) .... Take 1 Tablet By Mouth Two Times A Day  Current Medications (verified): 1)  Hydrochlorothiazide 25 Mg Tabs (Hydrochlorothiazide) .... Take 1 Tablet By Mouth Once A Day 2)  Aspirin 81 Mg Tbec (Aspirin) .... Take 1 Tablet By Mouth Once A Day 3)  Prilosec 40 Mg  Cpdr (Omeprazole) .... Take 1 Tablet By Mouth Once A Day 4)  Vicodin 5-500 Mg  Tabs (Hydrocodone-Acetaminophen)  .... One Tablet Every 4 Hours As Needed For Pain. 5)  Nitroglycerin Cr 2.5 Mg  Cr-Caps (Nitroglycerin) .... As Needed 6)  Lisinopril 40 Mg Tabs (Lisinopril) .... Take One Table By Mouth Daily 7)  Ventolin Hfa 108 (90 Base) Mcg/act Aers (Albuterol Sulfate) .... Take One To Two Puffs Every Four To Six Hours As Needed. 8)  Ibuprofen 800 Mg Tabs (Ibuprofen) .... Take One Tablet Every Six Hours As Needed For Rib Pain  Allergies (verified): No Known Drug Allergies  Family History: Reviewed history from 10/09/2006 and no changes required. Mother deceased at age 57s from CAD Father deceased age 13 from colon cancer 9 brothers, 1 deceased from drowning accident.- 2 brothers on dialysis 2 sisters with HTN, DM  Social History: Reviewed history from 10/09/2006 and no changes required. Lives in [place] with girlfriend. Tobacco abuse, etoh, No IVDU. 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 [name] (first cousin) - [phone]  Review of Systems      See HPI  Physical Exam  General:  alert and overweight-appearing.  NAD Head:  normocephalic and atraumatic.   Lungs:  Normal respiratory effort, chest expands symmetrically. Lungs are clear to auscultation, no crackles or wheezes. Heart:  Normal rate and regular rhythm. S1 and S2 normal without gallop, murmur, click, rub or other extra sounds. Abdomen:  Bowel sounds positive,abdomen soft and non-tender without masses, organomegaly or hernias noted. Msk:  Rght 2nd finger is mildly swollen, ROM at PIP are moderately limited secondary to pain and stiffness. Pulses:  R radial normal.   Extremities:  No clubbing, cyanosis, edema, or deformity noted with normal full range of motion of all joints.   Neurologic:  alert & oriented X3 and strength normal in all extremities.  walks with a cane.   Impression & Recommendations:  Problem # 1:  FALL, HX OF (ICD-V15.88)  Fall last week. Suspect secondary to lack of balance. His right hand 2nd finger is slightly swollen and  mildy tender to palpation and ROM are moderately reduced at PIP. Will get a Xray of the right hand to rule out any fractures.  Orders: T-DG Hand Complete*R* ([address])  Problem # 2:  ACUTE BRONCHITIS (ICD-466.0) Resolved. Finished full course of abx therapy. Took this as an opportunity to councel on tobacco cessation. It seems he doesn't want to quit at this moment. Needs regular councelling.  The following medications were removed from the medication list:    Doxycycline Hyclate 100 Mg Tabs (Doxycycline hyclate) .[name]... Take 1 tablet by mouth two times a day His updated medication list for this problem includes:    Ventolin Hfa 108 (  90 Base) Mcg/act Aers (Albuterol sulfate) .[name]... Take one to two puffs every four to six hours as needed.  Problem # 3:  HYPERTENSION (ICD-401.9) Slightly elevated. Patient hasn't taken his BP medicines today as he forgot. Looking at his BP in the last one year or so, his BP have been well controlled.No changes made during this visit.  His updated medication list for this problem includes:    Hydrochlorothiazide 25 Mg Tabs (Hydrochlorothiazide) .[name]... Take 1 tablet by mouth once a day    Lisinopril 40 Mg Tabs (Lisinopril) .[name]... Take one table by mouth daily  Problem # 4:  SPINAL STENOSIS, LUMBAR (ICD-724.02) MRI proven. Has been on chronic narcotics. No active issues currently. Continue current regimen.  Complete Medication List: 1)  Hydrochlorothiazide 25 Mg Tabs (Hydrochlorothiazide) .... Take 1 tablet by mouth once a day 2)  Aspirin 81 Mg Tbec (Aspirin) .... Take 1 tablet by mouth once a day 3)  Prilosec 40 Mg Cpdr (Omeprazole) .... Take 1 tablet by mouth once a day 4)  Vicodin 5-500 Mg Tabs (Hydrocodone-acetaminophen) .... One tablet every 4 hours as needed for pain. 5)  Nitroglycerin Cr 2.5 Mg Cr-caps (Nitroglycerin) .... As needed 6)  Lisinopril 40 Mg Tabs (Lisinopril) .... Take one table by mouth daily 7)  Ventolin Hfa 108 (90 Base) Mcg/act Aers (Albuterol  sulfate) .... Take one to two puffs every four to six hours as needed. 8)  Ibuprofen 800 Mg Tabs (Ibuprofen) .... Take one tablet every six hours as needed for rib pain T-DG Hand Complete*R* ([address])  Patient Instructions: 1)  Please schedule a follow-up appointment in 3 months. 2)  Take all the medications as advised below. 3)  Tobacco is very bad for your health and your loved ones! You Should stop smoking!. 4)  Stop Smoking Tips: Choose a Quit date. Cut down before the Quit date. decide what you will do as a substitute when you feel the urge to smoke(gum,toothpick,exercise).   Prevention & Chronic Care Immunizations   Influenza vaccine: Fluvax 3+  (03/19/2009)    Tetanus booster: Not documented   Td booster deferral: Deferred  (02/19/2009)    Pneumococcal vaccine: Pneumovax  (07/06/2008)   Pneumococcal vaccine due: 07/06/2013    H. zoster vaccine: Not documented  Colorectal Screening   Hemoccult: Not documented   Hemoccult action/deferral: Deferred  (02/19/2009)    [facility]onoscopy: [facility]    (03/24/2008)   Colonoscopy due: 03/2013  Other Screening   PSA: Not documented   PSA action/deferral: Discussed-PSA declined  (02/19/2009)   Smoking status: current  (10/27/2009)   Smoking cessation counseling: yes  (10/27/2009)  Lipids   Total Cholesterol: 159  (04/17/2009)   Lipid panel action/deferral: Lipid Panel ordered   LDL: 89  (04/17/2009)   LDL Direct: Not documented   HDL: 46  (04/17/2009)   Triglycerides: 118  (04/17/2009)    SGOT (AST): 23  (04/17/2009)   BMP action: Ordered   SGPT (ALT): 15  (04/17/2009)   Alkaline phosphatase: 45  (04/17/2009)   Total bilirubin: 0.8  (04/17/2009)  Hypertension   Last Blood Pressure: 150 / 88  (10/27/2009)   Serum creatinine: 1.10  (04/17/2009)   BMP action: Ordered   Serum potassium 4.5  (04/17/2009)  Self-Management Support :   Personal Goals (by the next clinic visit) :      Personal blood  pressure goal: 140/90  (02/19/2009)     Personal LDL goal: 100  (02/19/2009)    Patient will work  on the following items until the next clinic visit to reach self-care goals:     Medications and monitoring: take my medicines every day  (10/27/2009)     Eating: eat more vegetables, use fresh or frozen vegetables  (10/27/2009)     Activity: take a 30 minute walk every day, park at the far end of the parking lot  (09/28/2009)    Hypertension self-management support: Resources for patients handout  (10/27/2009)    Lipid self-management support: Resources for patients handout  (10/27/2009)     Self-management comments: PATIENT UNALBE TO ANSWER QUESTIONS      Resource handout printed.   Impression & Recommendations:  Orders: T-DG Hand Complete*R* ([address])   The following medications were removed from the medication list:    Doxycycline Hyclate 100 Mg Tabs (Doxycycline hyclate) .[name]... Take 1 tablet by mouth two times a day  His updated medication list for this problem includes:    Ventolin Hfa 108 (90 Base) Mcg/act Aers (Albuterol sulfate) .[name]... Take one to two puffs every four to six hours as needed.   His updated medication list for this problem includes:    Hydrochlorothiazide 25 Mg Tabs (Hydrochlorothiazide) .[name]... Take 1 tablet by mouth once a day    Lisinopril 40 Mg Tabs (Lisinopril) .[name]... Take one table by mouth daily   Complete Medication List: 1)  Hydrochlorothiazide 25 Mg Tabs (Hydrochlorothiazide) .... Take 1 tablet by mouth once a day 2)  Aspirin 81 Mg Tbec (Aspirin) .... Take 1 tablet by mouth once a day 3)  Prilosec 40 Mg Cpdr (Omeprazole) .... Take 1 tablet by mouth once a day 4)  Vicodin 5-500 Mg Tabs (Hydrocodone-acetaminophen) .... One tablet every 4 hours as needed for pain. 5)  Nitroglycerin Cr 2.5 Mg Cr-caps (Nitroglycerin) .... As needed 6)  Lisinopril 40 Mg Tabs (Lisinopril) .... Take one table by mouth daily 7)  Ventolin Hfa 108 (90 Base) Mcg/act Aers  (Albuterol sulfate) .... Take one to two puffs every four to six hours as needed. 8)  Ibuprofen 800 Mg Tabs (Ibuprofen) .... Take one tablet every six hours as needed for rib pain

## 2010-07-28 NOTE — Progress Notes (Signed)
Summary: med refill/gp  Phone Note Refill Request Message from:  Patient on July 04, 2010 10:16 AM  Refills Requested: Medication #1:  OXYCODONE HCL 5 MG TABS take 1 tablet every 6 hours as needed for pain. Last appt.06/03/10.   Method Requested: Pick up at Office Initial call taken by: Chinita Pester RN,  July 04, 2010 10:16 AM  Follow-up for Phone Call       Follow-up by: Clerance Lav MD,  July 08, 2010 2:12 PM    Prescriptions: OXYCODONE HCL 5 MG TABS (OXYCODONE HCL) take 1 tablet every 6 hours as needed for pain.  #120 x 0   Entered and Authorized by:   Clerance Lav MD   Signed by:   Clerance Lav MD on 07/08/2010   Method used:   Print then Give to Patient   RxID:   [id number]

## 2010-07-28 NOTE — Assessment & Plan Note (Signed)
Summary: EST-CK/FU/MEDS/CFB   Vital Signs:  Patient profile:   65 year old [name] Height:      74 inches (187.96 cm) Weight:      283.04 pounds (128.65 kg) BMI:     36.47 Temp:     97.5 degrees F (36.39 degrees C) oral Pulse rate:   71 / minute BP sitting:   134 / 76  (right arm)  Vitals Entered By: Angelina Ok RN (September 07, 2009 2:48 PM) CC: Depression Is Patient Diabetic? No Pain Assessment Patient in pain? yes     Location: back, legs Intensity: 7 Type: aching Onset of pain  Constant Nutritional Status BMI of > 30 = obese  Have you ever been in a relationship where you felt threatened, hurt or afraid?No   Does patient need assistance? Functional Status Self care Ambulation Impaired:Risk for fall Comments Aching in back and legs.  Pain med refill   Primary Care Provider:  Clerance Lav MD  CC:  Depression.  History of Present Illness: 65 yrs old [name] Past Medical History: HTN Chronic back pain-- MRI with  L2-3 degenerative chages, disc bulge and mild facet & ligamentous hypertrophy combine mild multifactorial stenosis. 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 Mild Aortic stenosis - aortic valve area 1.53cm sq 9/07 from 1.71cmsq 2004 Mild dilated left atrium Tobacco abuse prior admits for chest pain.  Chronic chest pain    negative cath 2003    negative stress myoview 2008    Unchanged echo 7/07 with EF 65%      comes in for continued follow up. He is having pain in left calf muscle when he walks. He needs to sit down and pain goes away at rest. The condition is limiting his amount of walking. It is not bothersome when he takes pain pillls. He wonders if I could give her some different pain pill which he will have to take less frequently.   Depression History:      The patient denies a depressed mood most of the day and a diminished interest in his usual daily activities.      Preventive Screening-Counseling & Management  Alcohol-Tobacco     Alcohol type: beer and liquor - only on weekends     Smoking Status: current     Smoking Cessation Counseling: yes     Packs/Day: 1 pack/ week     Year Started: AT THE AGE OF 16  Current Medications (verified): 1)  Hydrochlorothiazide 25 Mg Tabs (Hydrochlorothiazide) .... Take 1 Tablet By Mouth Once A Day 2)  Aspirin 81 Mg Tbec (Aspirin) .... Take 1 Tablet By Mouth Once A Day 3)  Prilosec 40 Mg  Cpdr (Omeprazole) .... Take 1 Tablet By Mouth Once A Day 4)  Vicodin 5-500 Mg  Tabs (Hydrocodone-Acetaminophen) .... One Tablet Every 4 Hours As Needed For Pain. 5)  Nitroglycerin Cr 2.5 Mg  Cr-Caps (Nitroglycerin) .... As Needed 6)  Lisinopril 40 Mg Tabs (Lisinopril) .... Take One Table By Mouth Daily  Allergies (verified): No Known Drug Allergies  Past History:  Past Medical History: Last updated: 12/05/2007 HTN Chronic back pain-- MRI with  L2-3 degenerative chages, disc bulge and mild facet & ligamentous hypertrophy combine mild multifactorial stenosis. 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 Mild Aortic stenosis - aortic valve area 1.53cm sq 9/07 from 1.71cmsq 2004 Mild dilated left atrium Tobacco abuse  prior admits for chest pain.  Chronic chest pain    negative cath 2003    negative stress myoview 2008    Unchanged echo 7/07 with EF 65%      Family History: Last updated: 10/22/06 Mother deceased at age 35s from CAD Father deceased age 72 from colon cancer 9 brothers, 1 deceased from drowning accident.- 2 brothers on dialysis 2 sisters with HTN, DM  Social History: Last updated: 10/22/2006 Lives in [place] with girlfriend. Tobacco abuse, etoh, No IVDU. 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  Risk Factors: Exercise: no (07/23/2009)  Risk Factors: Smoking Status: current (09/07/2009) Packs/Day: 1 pack/ week  (09/07/2009)  Review of Systems      See HPI  Physical Exam  General:  alert and overweight-appearing.   Head:  normocephalic and atraumatic.   Eyes:  vision grossly intact, pupils equal, pupils round, and pupils reactive to light.   Ears:  no external deformities.   Nose:  no external deformity.   Mouth:  pharynx pink and moist.   Neck:  supple.   Lungs:  normal respiratory effort and normal breath sounds.   Heart:  normal rate, regular rhythm, and Grade   4/6 systolic ejection murmur.   Abdomen:  soft, non-tender, normal bowel sounds, no masses, no guarding, no rigidity, no hepatomegaly, no splenomegaly, and distended.   Msk:  ttp over the right side over the ribs.  pain with twisting motions. strength in extremities 5/5  Neurologic:  alert & oriented X3, cranial nerves II-XII intact, strength normal in all extremities, and gait normal.   pt is stooping foward while walking, uses cane for support.    Impression & Recommendations:  Problem # 1:  HIP PAIN, RIGHT (ICD-719.45) Pain is stable and responds to minimal narcotics. cont on the same medicine. Explained to him that he can go on MS contin but he wants to continue same meds for now.  His updated medication list for this problem includes:    Aspirin 81 Mg Tbec (Aspirin) .[name]... Take 1 tablet by mouth once a day    Vicodin 5-500 Mg Tabs (Hydrocodone-acetaminophen) ..... One tablet every 4 hours as needed for pain.  Problem # 2:  SPINAL STENOSIS, LUMBAR (ICD-724.02) Pt is likely having neurological claudication. He had previous ABI which were normal. He does not have circulation problem. If his symptoms worsen he might benefit from surgery referral.   Problem # 3:  GERD (ICD-530.81) stable. no changes made. symptoms are satisfactorily controlled.  His updated medication list for this problem includes:    Prilosec 40 Mg Cpdr (Omeprazole) .[name]... Take 1 tablet by mouth once a day  Problem # 4:  TOBACCO ABUSE (ICD-305.1) Ongoing.  Not ready to quit yet.   Problem # 5:  HYPERTENSION (ICD-401.9) BP at goal. no changes made in therapy. Will recheck BMET in a year to rule out continued hyperkalemia.  His updated medication list for this problem includes:    Hydrochlorothiazide 25 Mg Tabs (Hydrochlorothiazide) .[name]... Take 1 tablet by mouth once a day    Lisinopril 40 Mg Tabs (Lisinopril) .[name]... Take one table by mouth daily  BP today: 134/76 Prior BP: 138/80 (07/23/2009)  Labs Reviewed: K+: 4.5 (04/17/2009) Creat: : 1.10 (04/17/2009)   Chol: 159 (04/17/2009)   HDL: 46 (04/17/2009)   LDL: 89 (04/17/2009)   TG: 118 (04/17/2009)  Complete Medication List: 1)  Hydrochlorothiazide 25 Mg Tabs (Hydrochlorothiazide) .... Take 1 tablet by mouth once a day 2)  Aspirin 81 Mg Tbec (Aspirin) .Marland KitchenMarland KitchenMarland Kitchen  Take 1 tablet by mouth once a day 3)  Prilosec 40 Mg Cpdr (Omeprazole) .... Take 1 tablet by mouth once a day 4)  Vicodin 5-500 Mg Tabs (Hydrocodone-acetaminophen) .... One tablet every 4 hours as needed for pain. 5)  Nitroglycerin Cr 2.5 Mg Cr-caps (Nitroglycerin) .... As needed 6)  Lisinopril 40 Mg Tabs (Lisinopril) .... Take one table by mouth daily  Patient Instructions: 1)  Please schedule a follow-up appointment in 3 months. Prescriptions: VICODIN 5-500 MG  TABS (HYDROCODONE-ACETAMINOPHEN) one tablet every 4 hours as needed for pain.  #120 x 3   Entered and Authorized by:   Clerance Lav MD   Signed by:   Clerance Lav MD on 09/07/2009   Method used:   Print then Give to Patient   RxID:   [id number] LISINOPRIL 40 MG TABS (LISINOPRIL) Take one table by mouth daily  #30 x 6   Entered and Authorized by:   Clerance Lav MD   Signed by:   Clerance Lav MD on 09/07/2009   Method used:   [facility]        [facility]  [address] [phone][facility] (retail)       [address]       [place], [place]  [id number]       Ph: [phone] or [phone]       Fax: [phone]   RxID:    [id number] PRILOSEC 40 MG  CPDR (OMEPRAZOLE) Take 1 tablet by mouth once a day  #30 x 3   Entered and Authorized by:   Clerance Lav MD   Signed by:   Clerance Lav MD on 09/07/2009   Method used:   [facility]        [facility]  [address] [phone]* (retail)       [address]       [place], [place]  [id number]       Ph: [phone] or [phone]       Fax: [phone]   RxID:   [id number]    Vital Signs:  Patient profile:   65 year old [name] Height:      74 inches (187.96 cm) Weight:      283.04 pounds (128.65 kg) BMI:     36.47 Temp:     97.5 degrees F (36.39 degrees C) oral Pulse rate:   71 / minute BP sitting:   134 / 76  (right arm)  Vitals Entered By: Angelina Ok RN (September 07, 2009 2:48 PM)   Prevention & Chronic Care Immunizations   Influenza vaccine: Fluvax 3+  (03/19/2009)    Tetanus booster: Not documented   Td booster deferral: Deferred  (02/19/2009)    Pneumococcal vaccine: Pneumovax  (07/06/2008)   Pneumococcal vaccine due: 07/06/2013    H. zoster vaccine: Not documented  Colorectal Screening   Hemoccult: Not documented   Hemoccult action/deferral: Deferred  (02/19/2009)    Colonoscopy: Location:  [facility].    (03/24/2008)   Colonoscopy due: 03/2013  Other Screening   PSA: Not documented   PSA action/deferral: Discussed-PSA declined  (02/19/2009)   Smoking status: current  (09/07/2009)   Smoking cessation counseling: yes  (09/07/2009)  Lipids   Total Cholesterol: 159  (04/17/2009)   Lipid panel action/deferral: Lipid Panel ordered   LDL: 89  (04/17/2009)   LDL Direct: Not documented   HDL: 46  (04/17/2009)   Triglycerides: 118  (  04/17/2009)    SGOT (AST): 23  (04/17/2009)   BMP action: Ordered   SGPT (ALT): 15  (04/17/2009)   Alkaline phosphatase: 45  (04/17/2009)   Total bilirubin: 0.8  (04/17/2009)    Lipid flowsheet reviewed?: Yes   Progress toward LDL goal: At  goal  Hypertension   Last Blood Pressure: 134 / 76  (09/07/2009)   Serum creatinine: 1.10  (04/17/2009)   BMP action: Ordered   Serum potassium 4.5  (04/17/2009)    Hypertension flowsheet reviewed?: Yes   Progress toward BP goal: At goal  Self-Management Support :   Personal Goals (by the next clinic visit) :      Personal blood pressure goal: 140/90  (02/19/2009)     Personal LDL goal: 100  (02/19/2009)    Patient will work on the following items until the next clinic visit to reach self-care goals:     Medications and monitoring: take my medicines every day, bring all of my medications to every visit  (09/07/2009)     Eating: drink diet soda or water instead of juice or soda, eat more vegetables, eat foods that are low in salt, eat baked foods instead of fried foods, eat fruit for snacks and desserts  (09/07/2009)     Activity: take a 30 minute walk every day, park at the far end of the parking lot  (09/07/2009)    Hypertension self-management support: Written self-care plan, Education handout  (09/07/2009)   Hypertension self-care plan printed.   Hypertension education handout printed    Lipid self-management support: Written self-care plan, Education handout  (09/07/2009)   Lipid self-care plan printed.   Lipid education handout printed

## 2010-07-28 NOTE — Assessment & Plan Note (Signed)
Summary: NP,R SHOULDER PAIN,MC   Vital Signs:  Patient profile:   65 year old male Height:      74 inches Weight:      289 pounds BP sitting:   172 / 100  Vitals Entered By: [name] CMA (December 01, 2009 11:03 AM)  [name]   History of Present Illness: *Sub-optimal historian  Insidious onset right shoulder pain  ~3 months ago. Pain gradually worsening in severity. Pain mostly on overhead motions. Some pain at night. Pain relieved by position change and rest. s/p left shoulder surgery several years ago; does not recall which procedure. Not taking anything for pain. No paresthesias.  ------------------------------------------------------------------------------------------------------------------------------  Of note, [name] has a hx of poorly controoled HTN. Did not take anti-hypertensive medications this morning, but states he will take them immediately following this encounter. Denies cppp, sob, nv, HA, vision/speech change, abd pn, weakness, or paresthesias.   Allergies (verified): No Known Drug Allergies PMH-FH-SH reviewed for relevance  Physical Exam  General:  Well-developed,well-nourished,in no acute distress; alert,appropriate and cooperative throughout examination Msk:  NEURO: (-) Spurling's bilaterally. Nl neurologic exam throughout upper extremities.  NECK: No spinal ttp. FROM without pain.  SHOULDERS: No atrophy. No swelilng or discoloration. Full ROM except grossly limited left shoulder ROM. (+) right hawkins and mild pain on right ER. Weak 4/5 strength. (-) Empty Can. (-) Speed's/Yerguson's. No signs of labral instability or pain.  ELBOWS: Full ROM. Full strength.  WRISTS/HANDS/FINGERS: Full ROM. Full strength. 2+ rad pulses.     Impression & Recommendations:  Problem # 1:  SHOULDER PAIN, RIGHT (ICD-719.41) Rotator Cuff Syndrome. Possible underlying rotator cuff tear.  - Start tramadol. - Start  formal physical therapy. - RTC in 1 month.  His updated medication list for this problem includes:    Aspirin 81 Mg Tbec (Aspirin) .[name]... Take 1 tablet by mouth once a day    Vicodin 5-500 Mg Tabs (Hydrocodone-acetaminophen) ..... One tablet every 4 hours as needed for pain.    Ibuprofen 800 Mg Tabs (Ibuprofen) .[name]... Take one tablet every six hours as needed for rib pain    Tramadol Hcl 50 Mg Tabs (Tramadol hcl) .[name]... 1 tab by mouth q 12 hrs as needed for pain  Problem # 2:  HYPERTENSION (ICD-401.9) Asymptomatic BP elevation  - Patient to immediately take anti-hypertensive medications as previously noted. He will also closely follow-up with his PCP for peristent bp elevation above his baseline.  His updated medication list for this problem includes:    Hydrochlorothiazide 25 Mg Tabs (Hydrochlorothiazide) .[name]... Take 1 tablet by mouth once a day    Lisinopril 40 Mg Tabs (Lisinopril) .[name]... Take one table by mouth daily  Complete Medication List: 1)  Hydrochlorothiazide 25 Mg Tabs (Hydrochlorothiazide) .... Take 1 tablet by mouth once a day 2)  Aspirin 81 Mg Tbec (Aspirin) .... Take 1 tablet by mouth once a day 3)  Prilosec 40 Mg Cpdr (Omeprazole) .... Take 1 tablet by mouth once a day 4)  Vicodin 5-500 Mg Tabs (Hydrocodone-acetaminophen) .... One tablet every 4 hours as needed for pain. 5)  Nitroglycerin Cr 2.5 Mg Cr-caps (Nitroglycerin) .... As needed 6)  Lisinopril 40 Mg Tabs (Lisinopril) .... Take one table by mouth daily 7)  Ventolin Hfa 108 (90 Base) Mcg/act Aers (Albuterol sulfate) .... Take one to two puffs every four to six hours as needed. 8)  Ibuprofen 800 Mg Tabs (Ibuprofen) .... Take one tablet every six hours as needed for  rib pain 9)  Tramadol Hcl 50 Mg Tabs (Tramadol hcl) .[name].. 1 tab by mouth q 12 hrs as needed for pain Prescriptions: TRAMADOL HCL 50 MG TABS (TRAMADOL HCL) 1 tab by mouth q 12 hrs as needed for pain  #60 x 0   Entered and Authorized by:   Valarie Merino MD    Signed by:   Valarie Merino MD on 12/01/2009   Method used:   [facility]        [facility]  [address] [phone]* (retail)       [address]       [place], [place]  [id number]       Ph: [phone] or [phone]       Fax: [phone]   RxID:   [id number]

## 2010-07-28 NOTE — Progress Notes (Signed)
Summary: Soc. Work  Programme researcher, broadcasting/film/video of Call: Called patient/No answer at phone number.  Would like to order an inhome aide for [name] and will discuss with PCP and try to get up with patient.

## 2010-08-12 ENCOUNTER — Ambulatory Visit (INDEPENDENT_AMBULATORY_CARE_PROVIDER_SITE_OTHER): Payer: Medicare Other | Admitting: Internal Medicine

## 2010-08-12 ENCOUNTER — Encounter: Payer: Self-pay | Admitting: Internal Medicine

## 2010-08-12 DIAGNOSIS — G5790 Unspecified mononeuropathy of unspecified lower limb: Secondary | ICD-10-CM

## 2010-08-12 DIAGNOSIS — F17200 Nicotine dependence, unspecified, uncomplicated: Secondary | ICD-10-CM

## 2010-08-12 DIAGNOSIS — M48061 Spinal stenosis, lumbar region without neurogenic claudication: Secondary | ICD-10-CM

## 2010-08-12 DIAGNOSIS — K219 Gastro-esophageal reflux disease without esophagitis: Secondary | ICD-10-CM

## 2010-08-12 DIAGNOSIS — I10 Essential (primary) hypertension: Secondary | ICD-10-CM

## 2010-08-12 DIAGNOSIS — E785 Hyperlipidemia, unspecified: Secondary | ICD-10-CM

## 2010-08-12 DIAGNOSIS — Z23 Encounter for immunization: Secondary | ICD-10-CM

## 2010-08-12 MED ORDER — GABAPENTIN 100 MG PO CAPS: 100.0000 mg | ORAL_CAPSULE | Freq: Three times a day (TID) | ORAL | Status: DC

## 2010-08-12 MED ORDER — OMEPRAZOLE 40 MG PO CPDR: 40.0000 mg | DELAYED_RELEASE_CAPSULE | Freq: Every day | ORAL | Status: DC

## 2010-08-12 MED ORDER — OXYCODONE HCL 5 MG PO CAPS: 5.0000 mg | ORAL_CAPSULE | Freq: Four times a day (QID) | ORAL | Status: DC | PRN

## 2010-08-12 NOTE — Patient Instructions (Signed)
Return in one month. You are started on new medicines that you have to take three times a day. Dont forget to bring medicine bottles on next visit.   Spinal Stenosis   One cause of back pain is spinal stenosis. Stenosis means narrowing. The spinal canal contains and protects the spinal cord and nerve roots. In spinal stenosis, the spinal canal narrows and pinches the spinal cord and nerves. This causes low back pain and pain in the legs. Stenosis may pinch the nerves that control muscle power and sensation in the legs. This leads to pain and abnormal feelings in the muscles and areas supplied by those nerves. Spinal stenosis often comes with aging as boney growths occur in the spinal canal. There is also a loss of the cartilage between the bones of the back which also adds to this problem. Sometimes the problem is present since birth.   SYMPTOMS  Pain can be made worse with activities.   Numbness, tingling, hot or cold feelings, weakness or a weariness in the legs.   Clumsiness, frequent falling and a foot-slapping gait which may come as a result of nerve damage and muscle weakness.    DIAGNOSIS  Your caregiver may suspect spinal stenosis if you have unusual leg symptoms as mentioned above.   Your orthopedic surgeon may request special x-rays to find out the cause of the problem.   TREATMENT  Sometimes treatments such as postural changes or nonsteroidal anti-inflammatory drugs will relieve the pain.  Flexing the spine by leaning forward while walking may relieve symptoms.   Lying with the knees drawn up to the chest may offer some relief.   These positions enlarge the space available to the nerves. They may make it easier for stenosis sufferers to walk longer distances.   Nonsteroidal anti-inflammatory medications may help relieve symptoms. They do this by decreasing swelling and inflammation.  Rest, followed by a gradual resumption of activity, also can help.   Aerobic activity,  such as bicycling, is often recommended.   Losing weight can also relieve some of the load on the spine.   When stenosis causes severe nerve root compression, conservative treatment may not be enough to maintain a normal life style. Surgery may be required.   SURGICAL TREATMENT  Your orthopedic surgeon may recommend surgery to relieve the pressure on affected nerves. In properly selected cases, the results are very good, and patients are able to continue a normal lifestyle.    Most of this information courtesy of the [employer].   Document Released: 09/02/2003  Document Re-Released: 09/08/2008 [facility] Patient Information 2011 [place], [place].

## 2010-08-12 NOTE — Assessment & Plan Note (Signed)
Says he has decreased to pack a week. Not ready to quit yet.

## 2010-08-12 NOTE — Assessment & Plan Note (Signed)
I will start him on gabapentin to see if he would benefit from it.

## 2010-08-12 NOTE — Assessment & Plan Note (Signed)
Stable disease, no warning symptoms of cauda equina at this time. Continue narcotic regimen. Patient is not interested in surgery.

## 2010-08-12 NOTE — Progress Notes (Signed)
  Subjective:    Patient ID: [name], male    DOB: 09-17-1945, 65 y.o.   MRN: [id number]  HPI 65 years old male with chronic back pain from spinal stenosis and hip pain from arthritis is here for the follow up. He is doing well otherwise and the pain symptoms are well controlled with the present regimen. He is unsure about his BP pills and reports he is taking only two kinds while he is supposed to take three kinds. He does not report any constipation from narcotics.  He reports feeling of bugs crawling on right leg which is usually worse after he takes a walk. This also happens when he tries to go in our out of the car. He does not have any bowel or bladder disturbances at this time   Review of Systems  Constitutional: Negative for fever, chills, activity change and appetite change.  HENT: Negative for nosebleeds, facial swelling, neck pain and tinnitus.   Eyes: Negative for pain, discharge and visual disturbance.  Respiratory: Negative for cough, chest tightness and shortness of breath.   Cardiovascular: Negative for chest pain and palpitations.  Gastrointestinal: Negative for nausea, vomiting, abdominal pain, blood in stool and abdominal distention.  Skin: Negative for rash.  Neurological: Negative for dizziness, seizures, weakness and headaches.  Psychiatric/Behavioral: Negative for suicidal ideas, confusion and agitation.       Objective:   Physical Exam  Constitutional: He is oriented to person, place, and time. He appears well-developed and well-nourished.  HENT:  Head: Normocephalic and atraumatic.  Right Ear: External ear normal.  Left Ear: External ear normal.  Eyes: Conjunctivae and EOM are normal. Pupils are equal, round, and reactive to light. Right eye exhibits no discharge. Left eye exhibits no discharge.  Neck: Normal range of motion. Neck supple. No thyromegaly present.  Cardiovascular: Normal rate and regular rhythm.   Murmur heard.      Systolic murmur 2/6    Pulmonary/Chest: Effort normal and breath sounds normal. No respiratory distress. He has no wheezes. He has no rales.  Abdominal: Soft. Bowel sounds are normal. He exhibits distension. He exhibits no mass. There is no tenderness. There is no rebound and no guarding.  Musculoskeletal: Normal range of motion.  Neurological: He is alert and oriented to person, place, and time. He has normal reflexes. No cranial nerve deficit. Coordination normal.  Skin: No rash noted. He is not diaphoretic. No erythema.  Psychiatric: He has a normal mood and affect. His behavior is normal. Judgment and thought content normal.          Assessment & Plan:

## 2010-08-12 NOTE — Assessment & Plan Note (Signed)
BP well controlled. Will ask him to bring his bottles back to assess what medicines he is truly taking.

## 2010-08-12 NOTE — Assessment & Plan Note (Signed)
Well controlled last checked. Would recheck in a year.

## 2010-08-12 NOTE — Assessment & Plan Note (Signed)
Fairly controlled but sometimes bother him when he lays down after eating. I educated him about foods to avoid and steps to prevent such occurences.

## 2010-09-06 LAB — POCT I-STAT, CHEM 8
BUN: 7 mg/dL (ref 6–23)
Chloride: 102 mEq/L (ref 96–112)
Creatinine, Ser: 1 mg/dL (ref 0.4–1.5)
Glucose, Bld: 93 mg/dL (ref 70–99)
Hemoglobin: 15.6 g/dL (ref 13.0–17.0)
Potassium: 3.6 mEq/L (ref 3.5–5.1)
Sodium: 137 mEq/L (ref 135–145)

## 2010-09-06 LAB — CBC
Hemoglobin: 14.3 g/dL (ref 13.0–17.0)
MCH: 31.8 pg (ref 26.0–34.0)
Platelets: 222 10*3/uL (ref 150–400)
RBC: 4.49 MIL/uL (ref 4.22–5.81)
WBC: 6.7 10*3/uL (ref 4.0–10.5)

## 2010-09-06 LAB — DIFFERENTIAL
Lymphocytes Relative: 25 % (ref 12–46)
Lymphs Abs: 1.7 10*3/uL (ref 0.7–4.0)
Monocytes Relative: 9 % (ref 3–12)
Neutro Abs: 4.4 10*3/uL (ref 1.7–7.7)
Neutrophils Relative %: 65 % (ref 43–77)

## 2010-09-14 ENCOUNTER — Encounter: Payer: Self-pay | Admitting: Internal Medicine

## 2010-09-14 ENCOUNTER — Ambulatory Visit (INDEPENDENT_AMBULATORY_CARE_PROVIDER_SITE_OTHER): Payer: Medicare Other | Admitting: Internal Medicine

## 2010-09-14 DIAGNOSIS — G5790 Unspecified mononeuropathy of unspecified lower limb: Secondary | ICD-10-CM

## 2010-09-14 DIAGNOSIS — M48061 Spinal stenosis, lumbar region without neurogenic claudication: Secondary | ICD-10-CM

## 2010-09-14 DIAGNOSIS — Z550 Illiteracy and low-level literacy: Secondary | ICD-10-CM

## 2010-09-14 DIAGNOSIS — I10 Essential (primary) hypertension: Secondary | ICD-10-CM

## 2010-09-14 DIAGNOSIS — Z733 Stress, not elsewhere classified: Secondary | ICD-10-CM

## 2010-09-14 MED ORDER — GABAPENTIN 300 MG PO CAPS: 300.0000 mg | ORAL_CAPSULE | Freq: Three times a day (TID) | ORAL | Status: DC

## 2010-09-14 MED ORDER — OXYCODONE HCL 5 MG PO CAPS: 5.0000 mg | ORAL_CAPSULE | Freq: Four times a day (QID) | ORAL | Status: DC | PRN

## 2010-09-14 NOTE — Assessment & Plan Note (Addendum)
Uncontrolled. Will increase Neurontin to 300 tid. Follow up in one month, assess the need to further increase neurontin.

## 2010-09-14 NOTE — Assessment & Plan Note (Signed)
Will have aid help organize medications.

## 2010-09-14 NOTE — Patient Instructions (Signed)
Make a follow up appointment in 1 month. Take all medication as directed. 

## 2010-09-14 NOTE — Progress Notes (Signed)
  Subjective:    Patient ID: [name], male    DOB: 29-Sep-1945, 65 y.o.   MRN: [id number]  HPI  65 yo woman with  Past Medical History  Diagnosis Date  . Hypertension   . Back pain, chronic     MRI 2002, spinal stenosis mild to moderate L2-3 and L3-4, advanced degernerative changes MRI 2010  . Aortic stenosis, mild     1.53 sqcm 9/07, now 2.73 (11/2009) with moderate regurgitation  . Enlarged LA (left atrium)     mild dilatation per echo  . Chest pain, unspecified     neg cath 2003, neg myoview 2008   comes to the clinic for peripheral neuropathy. Patient was placed on low dose neurontin last month. Medication did not help pain so patient discontinued taking medication. Denies saddle anesthesia, bowel or bladder incontinence, new weakness or tingling.  Patient reports that he does not know how to read. He identifies pills by their shape, size, and color. Unsure if he is taking medication as directed. Patient has an aid at home although she does not help him with his medications because he thought he could do them by himself.  Review of Systems  [all other systems reviewed and are negative       Objective:   Physical Exam  [vitalsreviewed. Constitutional: He is oriented to person, place, and time. He appears well-developed and well-nourished.  HENT:  Head: Normocephalic and atraumatic.  Right Ear: External ear normal.  Left Ear: External ear normal.  Eyes: Conjunctivae and EOM are normal. Pupils are equal, round, and reactive to light. Right eye exhibits no discharge. Left eye exhibits no discharge.  Neck: Normal range of motion. Neck supple. No thyromegaly present.  Cardiovascular: Normal rate and regular rhythm.   Murmur heard.      Systolic murmur 2/6  Pulmonary/Chest: Effort normal and breath sounds normal. No respiratory distress. He has no wheezes. He has no rales.  Abdominal: Soft. Bowel sounds are normal. He exhibits no mass. There is no tenderness. There is no  rebound and no guarding.  Musculoskeletal: Normal range of motion.  Neurological: He is alert and oriented to person, place, and time. He has normal reflexes. No cranial nerve deficit. Coordination normal.  Skin: No rash noted. He is not diaphoretic. No erythema.  Psychiatric: He has a normal mood and affect. His behavior is normal. Judgment and thought content normal.          Assessment & Plan:

## 2010-09-14 NOTE — Assessment & Plan Note (Signed)
Stable. Continue current pain regimen. Refilled oxycodone today.

## 2010-09-14 NOTE — Assessment & Plan Note (Signed)
Elevated today but prior visit controlled. Patient unsure if he is taking his medication properly. Will have his aid organize medication in a pill box. Recheck BP on follow up.

## 2010-09-30 ENCOUNTER — Emergency Department (HOSPITAL_COMMUNITY)
Admission: EM | Admit: 2010-09-30 | Discharge: 2010-09-30 | Disposition: A | Discharge status: Home / Self Care | Payer: Medicare Other | Attending: Emergency Medicine | Admitting: Emergency Medicine

## 2010-09-30 ENCOUNTER — Emergency Department (HOSPITAL_COMMUNITY): Payer: Medicare Other

## 2010-09-30 DIAGNOSIS — M549 Dorsalgia, unspecified: Secondary | ICD-10-CM | POA: Insufficient documentation

## 2010-09-30 DIAGNOSIS — S0003XA Contusion of scalp, initial encounter: Secondary | ICD-10-CM | POA: Insufficient documentation

## 2010-09-30 DIAGNOSIS — Z79899 Other long term (current) drug therapy: Secondary | ICD-10-CM | POA: Insufficient documentation

## 2010-09-30 DIAGNOSIS — R51 Headache: Secondary | ICD-10-CM | POA: Insufficient documentation

## 2010-09-30 DIAGNOSIS — W06XXXA Fall from bed, initial encounter: Secondary | ICD-10-CM | POA: Insufficient documentation

## 2010-09-30 DIAGNOSIS — S0990XA Unspecified injury of head, initial encounter: Secondary | ICD-10-CM | POA: Insufficient documentation

## 2010-09-30 DIAGNOSIS — G8929 Other chronic pain: Secondary | ICD-10-CM | POA: Insufficient documentation

## 2010-09-30 DIAGNOSIS — I10 Essential (primary) hypertension: Secondary | ICD-10-CM | POA: Insufficient documentation

## 2010-09-30 DIAGNOSIS — Y92009 Unspecified place in unspecified non-institutional (private) residence as the place of occurrence of the external cause: Secondary | ICD-10-CM | POA: Insufficient documentation

## 2010-09-30 DIAGNOSIS — R220 Localized swelling, mass and lump, head: Secondary | ICD-10-CM | POA: Insufficient documentation

## 2010-10-17 ENCOUNTER — Ambulatory Visit (INDEPENDENT_AMBULATORY_CARE_PROVIDER_SITE_OTHER): Payer: Medicare Other | Admitting: Internal Medicine

## 2010-10-17 ENCOUNTER — Encounter: Payer: Self-pay | Admitting: Internal Medicine

## 2010-10-17 DIAGNOSIS — F17200 Nicotine dependence, unspecified, uncomplicated: Secondary | ICD-10-CM

## 2010-10-17 DIAGNOSIS — M48061 Spinal stenosis, lumbar region without neurogenic claudication: Secondary | ICD-10-CM

## 2010-10-17 DIAGNOSIS — I10 Essential (primary) hypertension: Secondary | ICD-10-CM

## 2010-10-17 DIAGNOSIS — G5790 Unspecified mononeuropathy of unspecified lower limb: Secondary | ICD-10-CM

## 2010-10-17 DIAGNOSIS — I739 Peripheral vascular disease, unspecified: Secondary | ICD-10-CM

## 2010-10-17 DIAGNOSIS — K219 Gastro-esophageal reflux disease without esophagitis: Secondary | ICD-10-CM

## 2010-10-17 DIAGNOSIS — I359 Nonrheumatic aortic valve disorder, unspecified: Secondary | ICD-10-CM

## 2010-10-17 MED ORDER — GABAPENTIN 300 MG PO CAPS: 600.0000 mg | ORAL_CAPSULE | Freq: Three times a day (TID) | ORAL | Status: DC

## 2010-10-17 MED ORDER — OMEPRAZOLE 40 MG PO CPDR: 40.0000 mg | DELAYED_RELEASE_CAPSULE | Freq: Every day | ORAL | Status: DC

## 2010-10-17 MED ORDER — LISINOPRIL 40 MG PO TABS: 40.0000 mg | ORAL_TABLET | Freq: Every day | ORAL | Status: DC

## 2010-10-17 MED ORDER — OXYCODONE HCL 5 MG PO CAPS: 5.0000 mg | ORAL_CAPSULE | Freq: Four times a day (QID) | ORAL | Status: DC | PRN

## 2010-10-17 MED ORDER — AMLODIPINE BESYLATE 10 MG PO TABS: 10.0000 mg | ORAL_TABLET | Freq: Every day | ORAL | Status: DC

## 2010-10-17 MED ORDER — HYDROCHLOROTHIAZIDE 25 MG PO TABS: 25.0000 mg | ORAL_TABLET | Freq: Every day | ORAL | Status: DC

## 2010-10-17 NOTE — Assessment & Plan Note (Signed)
Continues to smoke. Could be contributing to PVD and microvascular disease with claudication.

## 2010-10-17 NOTE — Patient Instructions (Signed)
Increase your gabapentin (neurontin) to two tablet three times a day. Get the ABI test done STOP SMOKING

## 2010-10-17 NOTE — Progress Notes (Signed)
  Subjective:    Patient ID: [name], male    DOB: 1945-12-12, 65 y.o.   MRN: [id number]  Back Pain Pertinent negatives include no abdominal pain, chest pain, fever, headaches or weakness.   65 years old male with chronic back pain from spinal stenosis and hip pain from arthritis is here for the follow up. He is doing well otherwise and the pain symptoms are well controlled with the present regimen. He has been on neurontin without much benefit for his leg pain. His leg pain is mainly in the calf and front of the shin area on the left leg, described as "burning" "fire" "ant bites". Graded 10/10, worse after walking and improved with rest. He is taking 300mg  neurontin tid. His back pain is controlled.   Review of Systems  Constitutional: Negative for fever, chills, activity change and appetite change.  HENT: Negative for nosebleeds, facial swelling, neck pain and tinnitus.   Eyes: Negative for pain, discharge and visual disturbance.  Respiratory: Negative for cough, chest tightness and shortness of breath.   Cardiovascular: Negative for chest pain and palpitations.  Gastrointestinal: Negative for nausea, vomiting, abdominal pain, blood in stool and abdominal distention.  Musculoskeletal: Positive for back pain.       Sharp burning pains in the left leg  Skin: Negative for rash.  Neurological: Negative for dizziness, seizures, weakness and headaches.  Psychiatric/Behavioral: Negative for suicidal ideas, confusion and agitation.       Objective:   Physical Exam  Constitutional: He is oriented to person, place, and time. He appears well-developed and well-nourished.  HENT:  Head: Normocephalic and atraumatic.  Right Ear: External ear normal.  Left Ear: External ear normal.  Eyes: Conjunctivae and EOM are normal. Pupils are equal, round, and reactive to light. Right eye exhibits no discharge. Left eye exhibits no discharge.  Neck: Normal range of motion. Neck supple. No  thyromegaly present.  Cardiovascular: Normal rate and regular rhythm.   Murmur heard.      Systolic murmur 2/6  Pulmonary/Chest: Effort normal and breath sounds normal. No respiratory distress. He has no wheezes. He has no rales.  Abdominal: Soft. Bowel sounds are normal. He exhibits distension. He exhibits no mass. There is no tenderness. There is no rebound and no guarding.  Musculoskeletal: Normal range of motion.  Neurological: He is alert and oriented to person, place, and time. He has normal reflexes. No cranial nerve deficit. Coordination normal.  Skin: No rash noted. He is not diaphoretic. No erythema.  Psychiatric: He has a normal mood and affect. His behavior is normal. Judgment and thought content normal.          Assessment & Plan:

## 2010-10-17 NOTE — Assessment & Plan Note (Signed)
Increased the dose of neurontin in event the pain is largely related to the spinal stenosis. But I also have doubts that this is related to vascular claudication. This could also be neurogenic claudication.

## 2010-10-17 NOTE — Assessment & Plan Note (Signed)
Unchanged physical findings. Continue pain management.

## 2010-10-17 NOTE — Assessment & Plan Note (Signed)
BP well controlled. No changes in the medications.

## 2010-10-26 ENCOUNTER — Ambulatory Visit (HOSPITAL_COMMUNITY)
Admission: RE | Admit: 2010-10-26 | Discharge: 2010-10-26 | Disposition: A | Discharge status: Home / Self Care | Payer: Medicare Other | Source: Ambulatory Visit | Attending: Internal Medicine | Admitting: Internal Medicine

## 2010-10-26 DIAGNOSIS — I739 Peripheral vascular disease, unspecified: Secondary | ICD-10-CM

## 2010-10-26 DIAGNOSIS — M79609 Pain in unspecified limb: Secondary | ICD-10-CM | POA: Insufficient documentation

## 2010-10-26 DIAGNOSIS — I10 Essential (primary) hypertension: Secondary | ICD-10-CM | POA: Insufficient documentation

## 2010-11-08 NOTE — Consult Note (Signed)
NAMELENOARD, HELBERT NO.:  [id number]   MEDICAL RECORD NO.:  [id number]          PATIENT TYPE:  INP   LOCATION:  2037                         FACILITY:  [facility]   PHYSICIAN:  Noralyn Pick. Eden Emms, MD, FACCDATE OF BIRTH:  September 15, 1945   DATE OF CONSULTATION:  12/05/2007  DATE OF DISCHARGE:                                 CONSULTATION   REFERRING PHYSICIAN:  Acey Lav, MD   PRIMARY CARDIOLOGIST:  Noralyn Pick. Eden Emms, M.D., [facility]   PRIMARY CARE Trease [place]:  Valetta Close, M.D.   PATIENT PROFILE:  A 65 year old African American male with prior history  of normal coronary arteries who presents with recurrent chest pain.   PROBLEM LIST:  1. Chronic chest pain.      a.     Question of non-ST elevation MI approximately 15 years ago.      b.     Catheterization on October 07, 2001, showing normal coronary       arteries with EF of 65%.      c.     Negative Myoview in 2008.  2. Mild aortic insufficiency.      a.     2D echocardiogram on January 10, 2007, EF of 65%, no wall       motion abnormalities.  Mild-to-moderate LVH.  Mild AI.  3. Hypertension.  4. Ongoing tobacco abuse.      a.     30-pack-year history, currently smoking about half a pack a       day.  5. Ongoing ETOH abuse.      a.     Currently drinking 40-ounce beer a day.  6. History of arthroscopic surgery of the left shoulder.  7. DJD and chronic back pain, on Vicodin.  8. GERD.   HISTORY OF PRESENT ILLNESS:  A 65 year old African American male with  history of chest pain and normal coronary arteries by cardiac  catheterization in April 2003 with subsequent negative Myoview in 2008.  He had a long history of chest pain going back many years at least as  far back as his last catheterization in 2003.  His pain is described  with rest and exertion associated with occasional diaphoresis, usually  occurring several times a month, lasting 10-15 minutes, resolving  spontaneously.  Over the past 2 weeks, symptoms  have increased in  frequency and severity and now occur twice a day.  So, his primary care  Sukhman Kocher today has admitted him for evaluation.  He is currently pain  free.   ALLERGIES:  No known drug allergies.   HOME MEDICATIONS:  1. HCTZ 25 mg daily.  2. Aspirin 81 mg daily.  3. Prilosec 40 mg daily.  4. Vicodin 5/500 mg p.r.n.   FAMILY HISTORY:  Mother died of heart dropsy at age 35, father died of  colon CA in his 48s.   SOCIAL HISTORY:  Lives in [place] with his wife.  He is retired.  He  smokes half pack a day currently and has greater than 30-pack-year  history.  Drinks 40-ounce beer a day at least.  Denies any drug use.  Does not routinely exercise.   REVIEW OF SYSTEMS:  Positive for chest pain, dyspnea, and sweats as  outlined in the HPI.  He also has bilateral leg pain that occurs at rest  and with exertion.   PHYSICAL EXAMINATION:  VITAL SIGNS:  Temperature 98.2, heart rate is 79,  respirations 20, blood pressure 155/93, pulse ox 99% on room air.  GENERAL:  Pleasant African American male in no acute distress.  Weight  110.  HEENT:  Normal.  NEURO:  Grossly intact, nonfocal.  SKIN:  Warm and dry with chronic venostasis, healed up ulcers on his  anterior lower extremities.  NECK:  There is a soft right carotid bruit.  No JVD.  LUNGS:  Respirations are unlabored.  CARDIAC:  Regular S1, S2 with 2/6 sytolic murmur at the right upper  sternal border.  ABDOMEN:  Obese, soft, nontender, nondistended.  Bowel sounds present.  EXTREMITIES:  Warm, dry, and pink.  No clubbing, cyanosis, or edema.  Distal pulses are 1+ equal bilaterally.   Lab work and chest x-ray is pending.  EKG shows sinus rhythm with normal  axis rate of 73 beats per minute.  No acute ST-T changes.   ASSESSMENT AND PLAN:  1. Chest pain.  He has a history of chest pain dating back at least      from back his catheterization in 2003.  At that time, he had normal      coronary arteries.  He also had  negative Myoview in 2008.  Despite      this, he has ongoing pain with multiple risk factors, and we will      plan on left heart cardiac catheterization in the a.m.  2. Hypertension.  Currently elevated, follow and adjust medications as      necessary.  Currently on HCTZ only.  3. History of gastroesophageal reflux disease.  He remains on PPI      therapy per the primary team.  4. Polysubstance abuse, complete cessation of tobacco and alcohol      advised.  May need deep vein thrombosis prophylaxis if he has      prolonged hospitalization.      Nicolasa Ducking, ANP      Noralyn Pick. Eden Emms, MD, [facility]  Electronically Signed    CB/MEDQ  D:  12/05/2007  T:  12/06/2007  Job:  [id number]

## 2010-11-08 NOTE — Discharge Summary (Signed)
NAMEBEAUFORD, LANDO NO.:  [id number]   MEDICAL RECORD NO.:  [id number]          PATIENT TYPE:  INP   LOCATION:  2037                         FACILITY:  [facility]   PHYSICIAN:  Acey Lav, MD  DATE OF BIRTH:  Apr 23, 1946   DATE OF ADMISSION:  12/05/2007  DATE OF DISCHARGE:  12/07/2007                               DISCHARGE SUMMARY   DISCHARGE DIAGNOSES:  1. Chest pain with typical and atypical features ruled out for      myocardial infarction with negative catheterization, normal cardiac      enzymes, and unchanged EKGs.  2. Aortic stenosis with an aortic valve area of 2.54 cm2 in 12/2006.  3. Lower extremity edema, ruled out for deep venous thrombosis, Baker      cyst, or superficial thrombosis by lower extremity Dopplers at this      admission.4.  History of multifactorial dyspnea (causes likely      include GERD).  4. Carotid bruits with normal carotid Dopplers during this admission.  5. Hypertension, well controlled.  6. Spinal stenosis with subsequent chronic pain with an MRI showing      degenerative disk disease at L4-L5.  7. History of gastroesophageal reflux disease.  8. Tobacco abuse.   DISCHARGE MEDICATIONS:  1. Protonix 40 mg p.o. b.i.d.  2. Aspirin 81 mg p.o. q. Daily.  3. Vicodin 5/500 mg 1 tablet q.4 h. p.r.n.  4. Hydrochlorothiazide 25 mg p.o. q. daily.  5. Nitroglycerin 0.4 mg sublingual q. 5 minutes x3 p.r.n. chest pain.   FOLLOWUP:  1. The patient will have an appointment with [facility] and      they will call him with the appointment for a 2-D echo and the CT      of his chest to better evaluate his aorta.  2. He has an appointment with Dr. Randa Evens with [facility] on December 13, 2007 at 11 a.m. and an appointment with outpatient clinic and the      clinic will call him with the appointment scheduled in 1-2 weeks.   PROCEDURES:  The patient had lower extremity Dopplers that were  negative.  He also had carotid Dopplers  that were negative.  He had a  normal cath by Dr. Eden Emms on June 12.  It only showed mild-to-moderate  dilated aorta with 2+ aortic insufficiency.  An EKG done on admission  showed normal sinus rhythm, unchanged from prior.   HISTORY OF PRESENT ILLNESS:  This is a 65 year old man with a past  medical history as outlined above who presented to the outpatient clinic  for routine followup and noted that his substernal chest pain has been  worsening over the last month.  He was started on Prilosec last month  for GERD, but his symptoms have worsened and are associated with  shortness of breath, diaphoresis, and dizziness.  He has no radiation or  pleuritic component of his chest pain.  He has no nausea or fever.  He  does note right side pain over the last period of time.  Risk factors  for cardiac  disease include age, hypertension, smoking, and he has  unknown lipid status.  There is a possible exertional component to his  chest pain.   ALLERGIES:  He has no known drug allergies.   PHYSICAL EXAMINATION:  VITAL SIGNS:  On admission, temperature 98.1,  blood pressure 138/82, pulse 82, and respiratory rate 18.  GENERAL:  He was alert, pleasant, somewhat poorly groomed, stuttering,  and in no distress.  EYES:  PERRLA.  Muddy sclerae.  Bilaterally injected conjunctivae.  EOMI.  ENT:  Moist mucous membranes.  Poor dentition.  NECK:  Supple.  No lymphadenopathy.  RESPIRATORY:  Clear to auscultation bilaterally.  Normal respiratory  effort.  CARDIOVASCULAR:  Regular rate and rhythm.  Systolic ejection murmur at  2/6.  No rubs or gallops.  GI:  Soft, obese, nontender, and nondistended.  Positive bowel sounds.  EXTREMITIES:  Trace bilateral lower extremity edema, but right lower  extremity larger than the left.  Multiple blisters and varicosities seen  on right calf, right thigh, and tender to palpation.  Distal dorsalis  pedis pulses 2+ bilaterally.  Strength was 5/5 in bilateral lower   extremities.  NEUROLOGIC:  Cranial nerves II-XII intact.  No focal deficit.  PSYCH:  Euthymic.   LABORATORY DATA:  White count 5.8, hemoglobin 15.6, hematocrit 46.7, MCV  98.4, and platelets 236.  Sodium 136, potassium 3.9, chloride 101, CO2  27, glucose 146, BUN 13, creatinine 1.18.  Bilirubin 0.8, alkaline  phosphatase 48, AST 34, ALT 27, total bili 3.7, calcium 9.7.  Cardiac  enzymes were negative x3.  BNP was 49.  D-dimer was 0.38.  C. diff was  negative.  Coags were normal.  Fasting lipid profile was normal with a  cholesterol of 170, triglycerides 91, HDL 54, LDL 98, hemoglobin A1c  6.0, and TSH 2.2.   ASSESSMENT AND PLAN:  This is a 65 year old male with multiple cardiac  risk factors and with extensive history of chest pain with negative  workup presenting with increased chest pain over the last month.  1. Chest pain.  The patient has an extensive history of chest pain as      mentioned above.  He has been admitted twice, last admission in      July 2008.  He had a negative catheterization in 2003 and a      negative stress Myoview in 2008.  He saw Dr. Eden Emms at the      beginning of April.  He was also admitted in July 2008 and was      ruled out for myocardial infarction and his chest pain was      attributed to gastroesophageal reflux disease.  He has been started      on omeprazole but apparently he had stopped taking the omeprazole      and was restarted at the end of May.  He is ruled out for MI this      time for myocardial infarction with negative cardiac enzymes,      unchanged and negative repeated EKGs, and also negative      catheterization performed on December 06, 2007 by Dr. Eden Emms.  He was      also ruled out for pulmonary embolism with a negative D-dimer and a      negative lower extremity Doppler.  His risk factors are age,      hypertension, and smoking, however, he does not have diabetes or      hyperlipidemia.  We discharged him on Protonix b.i.d. for  possible      gastrointestinal etiology of his chest pain and also on aspirin,      nitroglycerin p.r.n., and blood pressure medicines.  In case that      this is a skeletal pain, we discharged him on Vicodin also.  The      patient will have an appointment with [facility] (Dr.      Gala Romney) to investigate his aortic insufficiency.  At that time,      he will have a CT of his chest and an outpatient echo to evaluate      the aortic root.  At the time of discharge, his chest pain was      resolved.  2. Lower extremity swelling, right more than left, unclear etiology.      He is ruled out for deep venous thrombosis or superficial venous      thrombosis by lower extremity Dopplers.  The TSH was normal.  He      might need to have a sleep study in outpatient.  3. Chronic back pain. We continued his Vicodin.  4. Alcohol abuse.  We started him on thiamine and folate in the      hospital and we placed him on surveillance CIWA protocol.  5. For tobacco abuse.  We used nicotine patches.  6. Carotid bruits.  He had a negative carotid Doppler while in the      hospital.   DISCHARGE LABORATORY DATA:  Sodium 137, potassium 3.7, chloride 101,  bicarb 28, glucose 86, BUN 9, creatinine 1.05, calcium 9.3.  White count  6.6, hemoglobin 14.2, hematocrit 40.8, platelets 201.   DISCHARGE VITAL SIGNS:  Temperature 97.2, pulse 67, respirations 20,  blood pressure 121/78, and oxygen saturation 99% on room air.      Carlus Pavlov, M.D.  Electronically Signed      Acey Lav, MD  Electronically Signed    CG/MEDQ  D:  12/10/2007  T:  12/11/2007  Job:  [id number]   cc:   Bevelyn Buckles. Bensimhon, MD  Llana Aliment. Malon Kindle., M.D.

## 2010-11-08 NOTE — Discharge Summary (Signed)
[name], [name]              ACCOUNT NO.:  [id number]   MEDICAL RECORD NO.:  [id number]          PATIENT TYPE:  INP   LOCATION:  2020                         FACILITY:  [facility]   PHYSICIAN:  Alvester Morin, M.D.  DATE OF BIRTH:  1946/06/20   DATE OF ADMISSION:  01/09/2007  DATE OF DISCHARGE:  01/10/2007                               DISCHARGE SUMMARY   DISCHARGE DIAGNOSES:  1. Chest pain secondary to reflux.  Rule out acute coronary syndrome.  2. Mild aortic stenosis.  3. Hypertension.  4. Tobacco and alcohol dependence.  5. Chronic back pain secondary to degenerative disk disease and spinal      stenosis.   DISCHARGE MEDS:  Include:  1. Hydrochlorothiazide 25 mg, take 1 pill by mouth 3 times a day.  2. Aspirin 81 mg, take 1 tablet by mouth once a day.  3. Nexium 40 mg, take once every day before breakfast for 3 weeks.   Patient is to follow up with his primary care physician, Dr. Silvestre Mesi, on  February 11, 2007 at 4 p.m. in the internal med clinic at [facility]  Please check his BMET and blood pressure and adjust his meds  if needed.  Follow up on reflux symptoms.  If reflux symptom is still  persistent despite medical treatment, consider GI consult.  Please re-  address tobacco and alcohol dependence and encourage patient to quit.   PROCEDURES:  1. Chest x-ray on January 09, 2007 reveals no active cardiopulmonary      disease.  No signs of infection.  2. Cardiac echo.  Results are pending.   Patient is a 65 year old male with a past medical history significant  for mild aortic stenosis, cardiac cath in 2003 was normal, last echo in  September 2007 showed mild stenosis, hypertension, tobacco/alcohol  dependence, and obesity.  Presents with 4 weeks of chest pain.  Chest  pain has increased in severity.  Described as a heaviness that comes and  goes intermittently and lasted for about 20 minutes.  Worsens with  extreme physical exertion such as mowing the grass and  sometimes  relieved with rest.  Associated with radiation to his left arm, nausea,  and diaphoresis but no shortness of breath.  Pain still persisted after  taking aspirin and nitro in the ED.  Patient notices pains mostly at  night while lying down.  Pain is not associated with milk.  Unaware of  history of reflux but does note metallic taste in his mouth in the  morning.  In the past year, he has visited a cardiologist and reported  the same sensation of chest pain, and his cardiologist believes the pain  is due to reflux.  Otherwise, the patient has no fevers, URI symptoms,  cough, or dysuria.   ADMITTING LABS:  Sodium 140, potassium 3.7, chloride 108, BUN is 11,  creatinine 1, glucose 91.  CK-MB 1.7, troponin 0.09.  White blood count  is 7.4, hemoglobin 15, hematocrit 44.8, platelets 221, MCV 99.6.   VITAL SIGNS:  Temperature 98.6, blood pressure 155/85, pulse of 94,  respiratory rate 18,  O2 sats 98% on room air.  GENERAL:  The patient is not in acute distress.  HEENT:  Oropharynx is clear.  RESPIRATORY:  Distant breath sounds, prolonged expiratory phase, no  crackles, wheezes, rhonchi, good air movement.  NECK:  No JVD or carotid bruits.  CV:  Regular rate and rhythm, normal S1, S2, and 2/6 harsh crescendo-  decrescendo ejection murmur, heard best in the right upper sternal  border.  No radiation.  +2 carotid pulses.  EXTREMITIES:  1+ edema in lower extremities bilaterally.  No clubbing or  cyanosis.   HOSPITAL COURSE:  1. Chest pain secondary to reflux.  He presented hemodynamically      stable with chronic chest pain that is characteristic for ACS.      Thus, we obtained chest x-ray, EKG--albeit showed no acute or      concerning changes.  Troponin was mildly elevated but continued to      trend downward after 3 cycles.  He was placed on aspirin, nitro,      and metoprolol.  He tolerated his meds well without any adverse      reaction.  He was transferred to the floor on  telemetry.  Monitored      closely.  Remained stable throughout hospitalization, did not      compensate.  Chest pain continued and persisted during daytime      despite medical management; however, he reported improvement at      night while on Protonix.  With negative cardiac workup, clean cath      in 2003, unremarkable stress test in June 2007, and this suspected      history of reflux, his chest pain is likely to be due to severe      reflux.  He will be discharged on Nexium 40 mg and is scheduled to      follow up with Dr. Silvestre Mesi in 4 weeks.  He is advised to take his      medication once every morning 30 minutes prior to breakfast.  If      his symptoms persist, we should pursue more aggressive workup with      GI such as 24-hour pH flow or endoscopy.  2. Mild aortic stenosis.  He was hemodynamically stable with no      symptoms of syncope or worrisome signs throughout the hospital      course.  We obtained another cardiac study to delineate the      severity of his stenosis.  Echo results are pending.  Held HCTZ in      the setting of using low-dose metoprolol.  The patient tolerated      therapy well with no acute episodes of decompensation.  His PCP is      asked to reevaluate the echo study at the next hospital visit.  3. Hypertension.  He remained normotensive off his hypertensive meds      throughout the hospital stay.  No symptoms of headache, no visual      changes.  Blood pressure was monitored carefully.  His HCTZ will be      adjusted in the outpatient setting.  4. Tobacco and alcohol dependence.  During hospital stay, patient did      not demonstrate any signs of withdrawal such as delirium, tremors.      Vital signs remained stable.  He was placed on thiamine and folate      empirically.  He was counseled extensively on smoking and drinking  cessation.  He expressed some interest in quitting but not right      now.  He is not interested in quitting at this time.   Will talk to      his PCP about quitting at the next office visit.  5. Chronic back pain secondary to degenerative disk disease.  Pain      well controlled on Tylenol 650 mg.  6. Prophylaxis.  Was placed on Protonix to prevent stress ulcers.      Placed on SCDs to prevent DVTs.  During hospital stay, no symptoms      concerning for PE or stress ulcers.   VITAL SIGNS:  On day of discharge, vital signs are 98.6, blood pressure  130/68, pulse is 55, respiratory rate 20, saturating 100% on 2 L nasal  cannula.  GENERAL:  Not in any acute distress.  CARDIAC:  Normal S1, S2, regular rate and rhythm, 2/6 harsh systolic  crescendo-decrescendo murmur, heard best in the right upper sternal  border.  No radiation.  No bruits.  +2 carotid pulses.  PULMONARY:  Distant breath sounds, prolonged expiratory phase, no  wheezes.  EXTREMITIES:  +1 edema.   LABS:  TSH is 0.733, BNP 42. Cardiac:  CK is 71, CK-MB is 2.3, troponin  went from 0.13 down to 0.12.  Cholesterol is 182, triglycerides 86, HDL  is 79, LDL is 86, VLDL is 17, cholesterol/HDL is 2.3.     ______________________________  Rinaldo Cloud, M.D.  Electronically Signed    LT/MEDQ  D:  01/10/2007  T:  01/11/2007  Job:  [id number]   cc:   Ronda Fairly, M.D.

## 2010-11-08 NOTE — Op Note (Signed)
[name], [name]              ACCOUNT NO.:  [id number]   MEDICAL RECORD NO.:  [id number]          PATIENT TYPE:  AMB   LOCATION:  ENDO                         FACILITY:  [facility]   PHYSICIAN:  James L. Malon Kindle., M.D.DATE OF BIRTH:  [birth year]   DATE OF PROCEDURE:  01/22/2008  DATE OF DISCHARGE:                               OPERATIVE REPORT   PROCEDURE:  Esophagogastroduodenoscopy.   MEDICATIONS:  Cetacaine spray, fentanyl 75 mcg, Versed 7.5 mg IV.   INDICATIONS:  Chest pain of unclear cause probably felt due to reflux,  had a negative cath.   DESCRIPTION OF PROCEDURE:  Procedure explained to the patient and  consent obtained.  Left lateral decubitus position, the Pentax upper  scope inserted and advanced.  Esophagus was completely normal  endoscopically.  Small hiatal hernia.  No gross signs of Barrett's, etc.  Distal and proximal stomach were normal.  There was some slight  gastritis in the antrum.  In the duodenum were several duodenal erosions  consistent with duodenitis.  No other abnormalities.  The scope was  withdrawn.  Initial findings were confirmed.   ASSESSMENT:  1. Mild gastritis and duodenitis, probably due to acid peptic disease      or nonsteroidals.  The patient is currently asymptomatic on      Protonix.  2. Chest pain without obvious cause found on endoscopy.   PLAN:  Will continue to treat with Protonix for reflux.  He will follow-  up with in the clinic with Dr. Daiva Eves and will see back on as-needed  basis.           ______________________________  Llana Aliment Malon Kindle., M.D.     Waldron Session  D:  01/22/2008  T:  01/22/2008  Job:  [id number]   cc:   Acey Lav, MD  Fax: [phone]

## 2010-11-11 NOTE — Cardiovascular Report (Signed)
[place]. [facility]  Patient:    TARRY, FOUNTAIN Visit Number: [id number] MRN: [id number]          Service Type: MED Location: [phone] Attending Physician:  Farley Ly Dictated by:   Rollene Rotunda, M.D. [facility] Proc. Date: 10/07/01 Admit Date:  10/03/2001   CC:         Farley Ly, M.D.   Cardiac Catheterization  DATE OF BIRTH:  01-Apr-1946  PRIMARY CARE PHYSICIAN:  Farley Ly, M.D.  PROCEDURE:  Left heart catheterization/coronary arteriography.  INDICATIONS:  Evaluate patient with chest pain suggestive of unstable angina (786.51).  PROCEDURE NOTE:  Left heart catheterization was performed via the right femoral artery.  The artery was cannulated using an anterior wall puncture.  A #6 French arterial sheath was inserted via the modified Seldinger technique. Preformed Judkins and a pigtail catheter were utilized.  The patient tolerated the procedure well and left the lab in stable condition.  RESULTS:  HEMODYNAMICS: 1. LV 133/23. 2. AO 139/77.  CORONARIES: 1. The left main was normal.  2. The LAD was normal.  3. The circumflex was normal.  4. The right coronary artery was normal.  LEFT VENTRICULOGRAM:  A left ventriculogram was obtained in the RAO projection.  The EF was 65% with normal wall motion.  CONCLUSIONS:  Normal coronary arteries.  Normal left ventricular function.  PLAN:  No further cardiovascular workup is suggested.  The patient will be managed by the teaching service with consideration of GI treatment or evaluation if they feel this is warranted and he has continued pain. Dictated by:   Rollene Rotunda, M.D. [facility] Attending Physician:  Farley Ly DD:  10/07/01 TD:  10/07/01 Job: [id number]

## 2010-11-11 NOTE — Consult Note (Signed)
[place]  Patient:    [name], [name] Visit Number: [id number] MRN: [id number]          Service Type: MED Location: [phone] Attending Physician:  Farley Ly Dictated by:   Rollene Rotunda, M.D. [facility] Proc. Date: 10/04/01 Admit Date:  10/03/2001                            Consultation Report  PRIMARY CARE PHYSICIAN:  Farley Ly, M.D.  CARDIOLOGIST:  None.  REASON FOR CONSULTATION:  Evaluate patient with chest pain.  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 He was apparently seen at [facility] and managed medically. He reports having had a stress study following this that was negative for any evidence of ischemia. He says that for approximately six months he has been having chest discomfort. He notices this every day. He seems to be describing two different things. One is a burning discomfort at night when he lays down. With this he does get a water brash type sensation. This goes away when he stands up. However, he is also describing chest discomfort when he pushes the lawnmower or does other activity like this. He describes a substernal pain but similar to the burning he has at night. There is no radiation. He has no vomiting with this although he does feel nauseated. It can get intense (10 out of 10). He usually has to stop what he is doing and let it go away after several minutes. He says that he thinks the pain is getting worse. He finally presented when he had an episode that lasted longer than usual. In the emergency room he was treated with aspirin and Protonix. He is currently pain-free on heparin and IV nitroglycerin. He has had negative enzymes thus far. His EKG showed no acute changes, although there might be a possible old anteroseptal infarct.  PAST MEDICAL HISTORY:  The patient has no history  of hypertension or diabetes. He does not know his cholesterol status. He smokes cigarettes. 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  PAST SURGICAL HISTORY:  Left arthroscopic shoulder surgery.  ALLERGIES:  None.  MEDICATIONS:  Aspirin, IV nitroglycerin, IV heparin, Protonix (he has not taken any of these prior to this hospitalization).  SOCIAL HISTORY:  The patient has a girl friend. 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 He has smoked one pack per day for 25 years. He drinks alcohol on the weekends.  FAMILY HISTORY:  His mother died in her 40s questionably with congestive heart failure. He has nine brothers and three sisters, all younger, who do not have heart disease. His father died of cancer.  REVIEW OF SYSTEMS:  As stated in the History of Present Illness and positive for joint pain, nighttime leg cramping. Otherwise negative for all other systems.  PHYSICAL EXAMINATION:  GENERAL:  The patient is in no distress.  VITAL SIGNS:  Blood pressure 130/50, heart rate 60 and regular, afebrile.  HEENT:  Eyelids unremarkable. Pupils equal, round, and react to light. Fundi not visualized. Oral mucosa unremarkable.  NECK:  No jugular venous distention, waveform within normal limits, carotid upstroke brisk and symmetric, no bruits, no thyromegaly.  LYMPHATICS:  No cervical, axillary, or inguinal adenopathy.  LUNGS:  Clear  to auscultation bilaterally.  BACK:  No costovertebral angle tenderness.  CHEST:  Unremarkable.  HEART:  PMI not displaced or sustained, S1 & S2 within normal limits, no S3, no S4, no murmurs.  ABDOMEN:  Mildly obese, positive bowel sounds normal in frequency and pitch, no bruits, no rebound, no guarding, no midline pulse, no hepatomegaly, no splenomegaly.  SKIN:  No rash, no nodules.  EXTREMITIES:  2+ pulses throughout, no edema, no cyanosis, no clubbing,  no calf tenderness.  NEUROLOGICAL:  Oriented to person, place, and time. Cranial nerves II-XII grossly intact, motor grossly intact.  LABORATORY AND ACCESSORY DATA:  No acute disease, atherosclerotic changes on the aorta. EKG:  Normal sinus rhythm, possible anteroseptal myocardial infarction, no acute ST-T wave changes.  Hemoglobin 15, WBC 7.2, platelets 204. Sodium 138, potassium 4, BUN 6, creatinine 0.9. Liver enzymes within normal limits. TSH 1.36, CK-MB negative x3.  IMPRESSION/PLAN: 1. Chest discomfort -- the patient seems to be describing a couple of    different pains. The nighttime pain may well be reflux and I think he    should be treated for this. However, I am concerned about the exertional    chest discomfort. This sounds like crescendo angina. For this I agree with    a cardiac catheterization. Until that time, he will be maintained on    heparin, aspirin, and nitrates. Further management and risk reduction will    be based on the results of the catheterization. 2. Tobacco -- we had a discussion about the need to stop smoking and hopefully    he can comply with this. Dictated by:   Rollene Rotunda, M.D. [facility] Attending Physician:  Farley Ly DD:  10/04/01 TD:  10/05/01 Job: [phone] [id number]

## 2010-11-11 NOTE — Assessment & Plan Note (Signed)
[facility]                            CARDIOLOGY OFFICE NOTE   NAME:[name]                     MRN:          [id number]  DATE:10/11/2006                            DOB:          August 07, 1945    [name] is seen today as a new patient.  He is 65 years old.  He was  referred by Dr. Okey Dupre over at [facility].  Patient has been followed in the  past for smoking and some atypical chest pain.  He had a heart cath back  in 2003 by Dr. Antoine Poche, which was normal.  He has known mild aortic  stenosis.  His last echo done in July, 2007 showed some moderate  calcification.  The mean gradient across the valve was only 10 with a  peak gradient of 17 mmHg.  His LV function was normal.   The patient also has a history of high blood pressure, which has been  treated with diuretics.   From a cardiac standpoint, he has not had a recent stress test.  He has  been having some chest pain.  They sound more like reflux.  They tend to  be worse at night when he lays flat.  He is fairly sedentary.  He has  some leg and back pain chronically, which limits his activity.   The 10-point review of systems is remarkable for occasional headaches.  He does not have any significant palpitations, PND, or orthopnea.  He is  only taking hydrochlorothiazide 25 mg a day for his blood pressure.   He has no known allergies.   He has had previous rotator cuff surgery.  He is retired from [employer].  He is single.  He does not have children.  He has  brothers and sisters who live in [place].   Patient's mother died at age 46 of coronary disease.  Father died at age  60 of colon cancer.   The patient smokes about a pack a day for over 30 years.  He also drinks  a bit.  He says he does not drink that much during the week, but it  sounds like he has a bit of a problem tying one on most weekends.   His family history is otherwise noncontributory.   PHYSICAL  EXAMINATION:  VITAL SIGNS:  Blood pressure 130/80, pulse 70 and  regular.  HEENT:  Normal.  NECK:  Carotids are normal.  There is no parvus and no tardus.  CARDIAC:  His S1 and S2 are preserved.  There is a mild AS murmur.  ABDOMEN:  Benign.  EXTREMITIES:  Lower extremities are intact.  Pulses have no edema.  NEURO:  Nonfocal.  SKIN:  Warm and dry.   His baseline EKG shows sinus rhythm with some LVH.   IMPRESSION:  Mr. Homann has multiple issues; however, they all appear  stable.  His hypertension seems reasonably controlled with a single  diuretic.  There is no documented history of coronary disease.  His  chest pain sounds atypical and is likely related to his drinking and  reflux.  He was  encouraged to take over-the-counter Prevacid; however,  given his valvular disease, I think it is reasonable for him to have a  follow-up stress Myoview.  He has not had any evaluation in the last  five years, and he continues to smoke.  The patient also has mild aortic  stenosis.  He will have a follow-up echo in July.  His second heart  sound is preserved.  His mean gradient is quite low.  Overall, I think  this can be followed clinically.   The warning signs of syncope, chest pain, and congestive heart failure  were discussed with the patient.  I think his biggest long-term health  factor has to do with his lifestyle.  He is sedentary.  He smokes.  He  drinks too much on the weekends.  He will follow up with Dr. Okey Dupre in  regards to some of these issues.   I will see him back in July when he has his follow-up echo for AS.     [name]. Eden Emms, MD, [facility]  Electronically Signed    PCN/MedQ  DD: 10/11/2006  DT: 10/11/2006  Job #: [id number]   cc:   Eliseo Gum, M.D.

## 2010-11-13 ENCOUNTER — Observation Stay (HOSPITAL_COMMUNITY)
Admission: EM | Admit: 2010-11-13 | Discharge: 2010-11-14 | Disposition: A | Discharge status: Home / Self Care | Payer: Medicare Other | Attending: Internal Medicine | Admitting: Internal Medicine

## 2010-11-13 ENCOUNTER — Emergency Department (HOSPITAL_COMMUNITY): Payer: Medicare Other

## 2010-11-13 ENCOUNTER — Encounter: Payer: Self-pay | Admitting: Internal Medicine

## 2010-11-13 DIAGNOSIS — I10 Essential (primary) hypertension: Secondary | ICD-10-CM | POA: Insufficient documentation

## 2010-11-13 DIAGNOSIS — R110 Nausea: Secondary | ICD-10-CM | POA: Insufficient documentation

## 2010-11-13 DIAGNOSIS — M4800 Spinal stenosis, site unspecified: Secondary | ICD-10-CM | POA: Insufficient documentation

## 2010-11-13 DIAGNOSIS — F17200 Nicotine dependence, unspecified, uncomplicated: Secondary | ICD-10-CM | POA: Insufficient documentation

## 2010-11-13 DIAGNOSIS — F1010 Alcohol abuse, uncomplicated: Secondary | ICD-10-CM | POA: Insufficient documentation

## 2010-11-13 DIAGNOSIS — I359 Nonrheumatic aortic valve disorder, unspecified: Secondary | ICD-10-CM | POA: Insufficient documentation

## 2010-11-13 DIAGNOSIS — G8929 Other chronic pain: Secondary | ICD-10-CM | POA: Insufficient documentation

## 2010-11-13 DIAGNOSIS — R079 Chest pain, unspecified: Principal | ICD-10-CM | POA: Insufficient documentation

## 2010-11-13 DIAGNOSIS — G5790 Unspecified mononeuropathy of unspecified lower limb: Secondary | ICD-10-CM | POA: Insufficient documentation

## 2010-11-13 DIAGNOSIS — R61 Generalized hyperhidrosis: Secondary | ICD-10-CM | POA: Insufficient documentation

## 2010-11-13 LAB — CBC
HCT: 41.2 % (ref 39.0–52.0)
MCH: 31.4 pg (ref 26.0–34.0)
MCV: 88.6 fL (ref 78.0–100.0)
RBC: 4.65 MIL/uL (ref 4.22–5.81)
WBC: 6.7 10*3/uL (ref 4.0–10.5)

## 2010-11-13 LAB — CK TOTAL AND CKMB (NOT AT ARMC)
CK, MB: 2.7 ng/mL (ref 0.3–4.0)
Relative Index: 1.3 (ref 0.0–2.5)
Total CK: 210 U/L (ref 7–232)

## 2010-11-13 LAB — BASIC METABOLIC PANEL
Chloride: 101 mEq/L (ref 96–112)
GFR calc Af Amer: >60 mL/min (ref >60)
GFR calc non Af Amer: >60 mL/min (ref >60)
Potassium: 3.6 mEq/L (ref 3.5–5.1)
Sodium: 143 mEq/L (ref 135–145)

## 2010-11-13 LAB — RAPID URINE DRUG SCREEN, HOSP PERFORMED
Amphetamines: NOT DETECTED
Opiates: POSITIVE — AB
Tetrahydrocannabinol: NOT DETECTED

## 2010-11-13 LAB — POCT CARDIAC MARKERS
CKMB, poc: <1 ng/mL — ABNORMAL LOW (ref 1.0–8.0)
Troponin i, poc: <0.05 ng/mL (ref 0.00–0.09)

## 2010-11-13 LAB — DIFFERENTIAL
Lymphocytes Relative: 38 % (ref 12–46)
Lymphs Abs: 2.5 10*3/uL (ref 0.7–4.0)
Monocytes Relative: 9 % (ref 3–12)
Neutrophils Relative %: 52 % (ref 43–77)

## 2010-11-13 LAB — ETHANOL: Alcohol, Ethyl (B): 168 mg/dL — ABNORMAL HIGH (ref 0–10)

## 2010-11-13 LAB — TROPONIN I: Troponin I: <0.3 ng/mL (ref <0.30)

## 2010-11-13 NOTE — Progress Notes (Signed)
Hospital Admission Note Date: 11/13/2010  Patient name: [name] Medical record number: [id number] Date of birth: 1946/02/02 Age: 65 y.o. Gender: male PCP: Shawn Lav, MD  Medical Service: Internal Medicine  Attending physician:      Resident (R2/R3): Dr. Baltazar Apo    Pager: [phone] Resident (R1): Dr. Dorthula Rue    Pager: [phone]  Chief Complaint: Chest pain   History of Present Illness: [name] is a 65 year old man with pmh significant for HTN, and Spinal stenosis, who presents to the ED for chest pain. Patient states chest pain began early on day of admission. Chest pain is located substernal to epigastric region, does not radiate, and is accompanied by nausea, but no vomiting. Pt also states he was sweating on and off. He describes the pain as sharp, and has no relieving or aggravating factors such as position change, or inspiration/expiration. Patient denies being short of breath, or complaining of cough. Pt states this pain was similar to the pain he had in 2009 where he was admitted to rule out acute coronary syndrome. The pain is a 9/10 in severity, and has no other associated factors. Of note, patient admits to drinking a gallon of "moonshine" alcohol the night prior to admission.   Current Outpatient Prescriptions  Medication Sig Dispense Refill  . albuterol (VENTOLIN HFA) 108 (90 BASE) MCG/ACT inhaler Inhale into the lungs. Take 1-2 puffs every 4-6 hours as needed.       [name] amLODipine (NORVASC) 10 MG tablet Take 1 tablet (10 mg total) by mouth daily.  30 tablet  6  . aspirin 81 MG EC tablet Take 81 mg by mouth daily.        [name] gabapentin (NEURONTIN) 300 MG capsule Take 2 capsules (600 mg total) by mouth 3 (three) times daily.  90 capsule  2  . hydrochlorothiazide 25 MG tablet Take 1 tablet (25 mg total) by mouth daily.  30 tablet  6  . lisinopril (PRINIVIL,ZESTRIL) 40 MG tablet Take 1 tablet (40 mg total) by mouth daily.  30 tablet  6  . nitroGLYCERIN (NITROGLYCERIN) 2.5 MG CR  capsule Take 2.5 mg by mouth as needed.        [name] omeprazole (PRILOSEC) 40 MG capsule Take 1 capsule (40 mg total) by mouth daily.  30 capsule  11  . oxycodone (OXY-IR) 5 MG capsule Take 1 capsule (5 mg total) by mouth every 6 (six) hours as needed. For pain.  120 capsule  0    Allergies: Review of patient's allergies indicates no known allergies.  Past Medical History  Diagnosis Date  . Hypertension   . Back pain, chronic     MRI 2002, spinal stenosis mild to moderate L2-3 and L3-4, advanced degernerative changes MRI 2010  . Aortic stenosis, mild     1.53 sqcm 9/07, now 2.73 (11/2009) with moderate regurgitation  . Enlarged LA (left atrium)     mild dilatation per echo  . Chest pain, unspecified     neg cath 2003, neg myoview 2008 Last 2D Echo done June 2011 - EF 65-70% with grade 1 diastolic dysfunction    No past surgical history on file.  Family History  Problem Relation Age of Onset  . Heart disease Mother   . Cancer Father 4    colon cancer   . Kidney disease Brother     2 of nine brothers on dialysis  . Hypertension Sister     History   Social History  . Marital Status:  Single  . Years of Education: Finished 8th grade  Aid comes in and helps administer medications, cannot read   Occupational History  . Worked for city doing odd jobs   Social History Main Topics  . Smoking status: Current Everyday Smoker -- 1.5 packs/day for 30 years    Types: Cigarettes  . Smokeless tobacco: Not on file  . Alcohol Use: 1-2 gallons on weekends of homemade alcohol   . Drug Use: No  . Sexually Active: Not on file    Social History Narrative  . Lives at home alone but has a CNA that comes in daily and helps with medication administration and cleaning up house, needs a bath chair as he has many falls, walks with cane     Review of Systems: Pertinent items are noted in HPI.  Physical Exam:  Vitals  BP:130/82  T:98.6  HR:81  R:19  O2 Sats: 98% RA  General appearance:  alert and cooperative Head: Normocephalic, without obvious abnormality, atraumatic Eyes: conjunctivae/corneas clear. PERRL, EOM's intact. Fundi benign. Neck: no adenopathy, no carotid bruit, no JVD and supple, symmetrical, trachea midline Lungs: clear to auscultation bilaterally Heart: 2/6 ejection systolic murmur heard, regular rate and rhythm Abdomen: epigastric tenderness to palpation, bowel sounds positive, ND, no hepatosplenomegaly Extremities: no edema noted, no cyanosis Pulses: 2+ and symmetric Skin: dry skin noted on lower extremities, no rashes  Neurologic: Grossly normal strength is normal and equal in both upper and lower extremities, alert and oriented x3, sensation grossly intact   Lab results:  CBC:    Component Value Date/Time   WBC 6.7 11/13/2010 1250   HGB 14.6 11/13/2010 1250   HCT 41.2 11/13/2010 1250   PLT 190 11/13/2010 1250   MCV 88.6 11/13/2010 1250   NEUTROABS 3.4 11/13/2010 1250   LYMPHSABS 2.5 11/13/2010 1250   MONOABS 0.6 11/13/2010 1250   EOSABS 0.1 11/13/2010 1250   BASOSABS 0.0 11/13/2010 1250     Comprehensive Metabolic Panel:    Component Value Date/Time   NA 143 11/13/2010 1250   K 3.6 11/13/2010 1250   CL 101 11/13/2010 1250   CO2 27 11/13/2010 1250   BUN 17 11/13/2010 1250   CREATININE 0.83 11/13/2010 1250   GLUCOSE 73 11/13/2010 1250   CALCIUM 9.4 11/13/2010 1250    CKMB, POC                                <1.0       l      1.0-8.0          ng/mL  Troponin I, POC                          <0.05             0.00-0.09        ng/mL  Myoglobin, POC                           89.6              12-200           Ng/mL   Imaging results:   CXR 2 View: IMPRESSION:No active cardiopulmonary disease.  CT scan C-Spine done April 2012: facet arthropathy occurs at C2-3.  Degenerative disc disease is scattered throughout the cervical spine.  11 mm right thyroid hypodensity.  Assessment & Plan by Problem:  1.) Chest pain - likely non-cardiac in setting of  clinical presentation. Pain is located in epigastric region, and tender to palpation. Given history of heavy alcohol ingestion night prior, this may be GERD, gastritis vs PUD. Of note, patient had similar admission in 2009 and had a full work-up including cardiac cath which showed normal coronaries, normal LV function, and mild to moderately dilated ascending aorta with 2+ AI. Patient has also normal Echo, and had EGD which showed some evidence of gastritis. CXR does not show widened mediastinum, and pain does not radiate to back, making dissection very unlikely. Pt does not show any other clinical signs other than epigastric/chest pain to suggest pulmonary embolism, Well's criteria low.   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 There is concern for claudication with hx of prolonged smoking history, however recent ABIs done May 2012 were completely within normal limits. Will continue Neurontin, although patient states it does not control his symptoms well. Will continue home dose oxycodone.   3.) Spinal stenosis - advanced degenerative disc disease, evident by MRI 2010. Will continue home dose pain medications.   4.) GERD - uncertain if patient is taking PPI which may be contributing to his symptoms today. Will resume Protonix.   5.) Alcohol abuse - CIWA protocol, as well as counseling to refrain from drinking such large amounts of alcohol.   6.) DVT: Lovenox  7.) HTN: Currently well controlled. Will resume Norvasc and hold Lisinopril and HCTZ in setting of nausea and epigastric pain. Also hydrating patient.     Elyse Jarvis PGY-1     [phone]     Melida Quitter PGY-2    [phone]

## 2010-11-13 NOTE — Progress Notes (Signed)
Hospital Admission Note Date: 11/13/2010  Patient name: [name] Medical record number: [id number] Date of birth: 03/08/1946 Age: 65 y.o. Gender: male PCP: Shawn Lav, MD  Medical Service: Internal Medicine  Attending physician:      Resident (R2/R3): Dr. Baltazar Apo    Pager: [phone] Resident (R1): Dr. Dorthula Rue    Pager: [phone]  Chief Complaint: Chest pain   History of Present Illness: [name] is a 65 year old man with pmh significant for HTN, and Spinal stenosis, who presents to the ED for chest pain. Patient states chest pain began early on day of admission. Chest pain is located substernal to epigastric region, does not radiate, and is accompanied by nausea, but no vomiting. Pt also states he was sweating on and off. He describes the pain as sharp, and has no relieving or aggravating factors such as position change, or inspiration/expiration. Patient denies being short of breath, or complaining of cough. Pt states this pain was similar to the pain he had in 2009 where he was admitted to rule out acute coronary syndrome. The pain is a 9/10 in severity, and has no other associated factors. Of note, patient admits to drinking a gallon of "moonshine" alcohol the night prior to admission.   Current Outpatient Prescriptions  Medication Sig Dispense Refill  . albuterol (VENTOLIN HFA) 108 (90 BASE) MCG/ACT inhaler Inhale into the lungs. Take 1-2 puffs every 4-6 hours as needed.       [name] amLODipine (NORVASC) 10 MG tablet Take 1 tablet (10 mg total) by mouth daily.  30 tablet  6  . aspirin 81 MG EC tablet Take 81 mg by mouth daily.        [name] gabapentin (NEURONTIN) 300 MG capsule Take 2 capsules (600 mg total) by mouth 3 (three) times daily.  90 capsule  2  . hydrochlorothiazide 25 MG tablet Take 1 tablet (25 mg total) by mouth daily.  30 tablet  6  . lisinopril (PRINIVIL,ZESTRIL) 40 MG tablet Take 1 tablet (40 mg total) by mouth daily.  30 tablet  6  . nitroGLYCERIN (NITROGLYCERIN) 2.5 MG CR  capsule Take 2.5 mg by mouth as needed.        [name] omeprazole (PRILOSEC) 40 MG capsule Take 1 capsule (40 mg total) by mouth daily.  30 capsule  11  . oxycodone (OXY-IR) 5 MG capsule Take 1 capsule (5 mg total) by mouth every 6 (six) hours as needed. For pain.  120 capsule  0    Allergies: Review of patient's allergies indicates no known allergies.  Past Medical History  Diagnosis Date  . Hypertension   . Back pain, chronic     MRI 2002, spinal stenosis mild to moderate L2-3 and L3-4, advanced degernerative changes MRI 2010  . Aortic stenosis, mild     1.53 sqcm 9/07, now 2.73 (11/2009) with moderate regurgitation  . Enlarged LA (left atrium)     mild dilatation per echo  . Chest pain, unspecified     neg cath 2003, neg myoview 2008 Last 2D Echo done June 2011 - EF 65-70% with grade 1 diastolic dysfunction    No past surgical history on file.  Family History  Problem Relation Age of Onset  . Heart disease Mother   . Cancer Father 80    colon cancer   . Kidney disease Brother     2 of nine brothers on dialysis  . Hypertension Sister     History   Social History  . Marital Status:  Single  . Years of Education: Finished 8th grade  Aid comes in and helps administer medications, cannot read   Occupational History  . Worked for city doing odd jobs   Social History Main Topics  . Smoking status: Current Everyday Smoker -- 1.5 packs/day for 30 years    Types: Cigarettes  . Smokeless tobacco: Not on file  . Alcohol Use: 1-2 gallons on weekends of homemade alcohol   . Drug Use: No  . Sexually Active: Not on file    Social History Narrative  . Lives at home alone but has a CNA that comes in daily and helps with medication administration and cleaning up house, needs a bath chair as he has many falls, walks with cane     Review of Systems: Pertinent items are noted in HPI.  Physical Exam:  Vitals  BP:130/82  T:98.6  HR:81  R:19  O2 Sats: 98% RA  General appearance:  alert and cooperative Head: Normocephalic, without obvious abnormality, atraumatic Eyes: conjunctivae/corneas clear. PERRL, EOM's intact. Fundi benign. Neck: no adenopathy, no carotid bruit, no JVD and supple, symmetrical, trachea midline Lungs: clear to auscultation bilaterally Heart: 2/6 ejection systolic murmur heard, regular rate and rhythm Abdomen: epigastric tenderness to palpation, bowel sounds positive, ND, no hepatosplenomegaly Extremities: no edema noted, no cyanosis Pulses: 2+ and symmetric Skin: dry skin noted on lower extremities, no rashes  Neurologic: Grossly normal strength is normal and equal in both upper and lower extremities, alert and oriented x3, sensation grossly intact   Lab results:  CBC:    Component Value Date/Time   WBC 6.7 11/13/2010 1250   HGB 14.6 11/13/2010 1250   HCT 41.2 11/13/2010 1250   PLT 190 11/13/2010 1250   MCV 88.6 11/13/2010 1250   NEUTROABS 3.4 11/13/2010 1250   LYMPHSABS 2.5 11/13/2010 1250   MONOABS 0.6 11/13/2010 1250   EOSABS 0.1 11/13/2010 1250   BASOSABS 0.0 11/13/2010 1250     Comprehensive Metabolic Panel:    Component Value Date/Time   NA 143 11/13/2010 1250   K 3.6 11/13/2010 1250   CL 101 11/13/2010 1250   CO2 27 11/13/2010 1250   BUN 17 11/13/2010 1250   CREATININE 0.83 11/13/2010 1250   GLUCOSE 73 11/13/2010 1250   CALCIUM 9.4 11/13/2010 1250    CKMB, POC                                <1.0       l      1.0-8.0          ng/mL  Troponin I, POC                          <0.05             0.00-0.09        ng/mL  Myoglobin, POC                           89.6              12-200           Ng/mL   Imaging results:   CXR 2 View: IMPRESSION:No active cardiopulmonary disease.  CT scan C-Spine done April 2012: facet arthropathy occurs at C2-3.  Degenerative disc disease is scattered throughout the cervical spine.  11 mm right thyroid hypodensity.  Assessment & Plan by Problem:  1.) Chest pain - likely non-cardiac in setting of  clinical presentation. Pain is located in epigastric region, and tender to palpation. Given history of heavy alcohol ingestion night prior, this may be GERD, gastritis vs PUD. Of note, patient had similar admission in 2009 and had a full work-up including cardiac cath which showed normal coronaries, normal LV function, and mild to moderately dilated ascending aorta with 2+ AI. Patient has also normal Echo, and had EGD which showed some evidence of gastritis. CXR does not show widened mediastinum, and pain does not radiate to back, making dissection very unlikely. Pt does not show any other clinical signs other than epigastric/chest pain to suggest pulmonary embolism, Well's criteria low.   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 There is concern for claudication with hx of prolonged smoking history, however recent ABIs done May 2012 were completely within normal limits. Will continue Neurontin, although patient states it does not control his symptoms well. Will continue home dose oxycodone.   3.) Spinal stenosis - advanced degenerative disc disease, evident by MRI 2010. Will continue home dose pain medications.   4.) GERD - uncertain if patient is taking PPI which may be contributing to his symptoms today. Will resume Protonix.   5.) Alcohol abuse - CIWA protocol, as well as counseling to refrain from drinking such large amounts of alcohol.   6.) DVT: Lovenox  7.) HTN: Currently well controlled. Will resume Norvasc and hold Lisinopril and HCTZ in setting of nausea and epigastric pain. Also hydrating patient.

## 2010-11-14 LAB — COMPREHENSIVE METABOLIC PANEL
CO2: 27 mEq/L (ref 19–32)
Calcium: 9.5 mg/dL (ref 8.4–10.5)
Creatinine, Ser: 0.79 mg/dL (ref 0.4–1.5)
GFR calc Af Amer: >60 mL/min (ref >60)
GFR calc non Af Amer: >60 mL/min (ref >60)
Glucose, Bld: 83 mg/dL (ref 70–99)

## 2010-11-14 LAB — CARDIAC PANEL(CRET KIN+CKTOT+MB+TROPI)
CK, MB: 2.3 ng/mL (ref 0.3–4.0)
Total CK: 183 U/L (ref 7–232)
Troponin I: <0.3 ng/mL (ref <0.30)
Troponin I: <0.3 ng/mL (ref <0.30)

## 2010-11-14 LAB — DIFFERENTIAL
Basophils Absolute: 0 10*3/uL (ref 0.0–0.1)
Lymphocytes Relative: 22 % (ref 12–46)
Lymphs Abs: 1.6 10*3/uL (ref 0.7–4.0)
Monocytes Relative: 12 % (ref 3–12)
Neutrophils Relative %: 65 % (ref 43–77)

## 2010-11-14 LAB — LIPID PANEL
HDL: 91 mg/dL (ref >39)
LDL Cholesterol: 63 mg/dL (ref 0–99)
Triglycerides: 75 mg/dL (ref <150)

## 2010-11-14 LAB — CBC
Hemoglobin: 13.7 g/dL (ref 13.0–17.0)
MCHC: 35.1 g/dL (ref 30.0–36.0)
RDW: 16 % — ABNORMAL HIGH (ref 11.5–15.5)
WBC: 7.1 10*3/uL (ref 4.0–10.5)

## 2010-11-17 ENCOUNTER — Telehealth: Payer: Self-pay | Admitting: *Deleted

## 2010-11-17 NOTE — Telephone Encounter (Signed)
Call from Bridgett from [facility] would like to get an order for a Bedside commode and a walker for the pt.  Can be called to [facility] at [phone] ext [phone].

## 2010-11-18 ENCOUNTER — Encounter: Payer: Self-pay | Admitting: Licensed Clinical Social Worker

## 2010-11-18 ENCOUNTER — Other Ambulatory Visit: Payer: Medicare Other

## 2010-11-18 ENCOUNTER — Other Ambulatory Visit: Payer: Self-pay | Admitting: *Deleted

## 2010-11-18 DIAGNOSIS — M545 Low back pain, unspecified: Secondary | ICD-10-CM

## 2010-11-18 DIAGNOSIS — M48061 Spinal stenosis, lumbar region without neurogenic claudication: Secondary | ICD-10-CM

## 2010-11-18 MED ORDER — OXYCODONE HCL 5 MG PO CAPS: 5.0000 mg | ORAL_CAPSULE | Freq: Four times a day (QID) | ORAL | Status: DC | PRN

## 2010-11-18 NOTE — Progress Notes (Unsigned)
test

## 2010-11-18 NOTE — Telephone Encounter (Signed)
Called patient to let him know that we were ordering Home Health/med mgmt.  He was okay with using [facility] and had no other preference.   Faxed referral to [facility] today and will follow.

## 2010-11-19 LAB — DRUGS OF ABUSE SCREEN W/O ALC, ROUTINE URINE
Amphetamine Screen, Ur: NEGATIVE
Benzodiazepines.: NEGATIVE
Cocaine Metabolites: NEGATIVE
Marijuana Metabolite: NEGATIVE
Opiate Screen, Urine: NEGATIVE
Phencyclidine (PCP): NEGATIVE
Propoxyphene: NEGATIVE

## 2010-11-23 LAB — OPIATE, QUANTITATIVE, URINE
Hydrocodone: NEGATIVE NG/ML
Hydromorphone - Total: NEGATIVE NG/ML
Morphine Urine: NEGATIVE NG/ML
Oxycodone - Total: NEGATIVE NG/ML
Oxymorphone: NEGATIVE NG/ML

## 2010-11-25 ENCOUNTER — Telehealth: Payer: Self-pay | Admitting: *Deleted

## 2010-11-25 ENCOUNTER — Encounter: Payer: Medicare Other | Admitting: Internal Medicine

## 2010-11-25 ENCOUNTER — Encounter: Payer: Self-pay | Admitting: Internal Medicine

## 2010-11-25 NOTE — Progress Notes (Signed)
DOA:11/13/10 DOD: 11/14/10  Discharge update: 65 y/o man with PMH significant for HTN, neuropathic leg pain, alcohol abuse was admitted for chest pain. Patient admits drinking about 11/2 gallon of moonshine a night prior to his admission. His chest pain was likely alcohol induced gastritis. Cardiac origin to chest pain was ruled out with CE X 3 negative and with no new EKG changes. Because of his increased neuropathic pain, his neurontin dose from 600 mg TID was increased to 900 mg TID.  Labs to follow up: none  Medications: Reviewed.  Problem list: reviewed.

## 2010-11-25 NOTE — Telephone Encounter (Signed)
[facility], pt needs bedside commode, rolling walker... Both for unsteady gait...may we approve?

## 2010-11-25 NOTE — Progress Notes (Deleted)
  Subjective:    Patient ID: [name], male    DOB: Oct 01, 1945, 65 y.o.   MRN: [id number]  HPI    Review of Systems     Objective:   Physical Exam        Assessment & Plan:

## 2010-11-28 NOTE — Telephone Encounter (Signed)
Sure, let me know what you need from me.

## 2010-11-28 NOTE — Discharge Summary (Signed)
[name], [name]              ACCOUNT NO.:  [id number]  MEDICAL RECORD NO.:  [id number]           PATIENT TYPE:  O  LOCATION:  3711                         FACILITY:  [facility]  PHYSICIAN:  Blanch Media, M.D.DATE OF BIRTH:  December 20, 1945  DATE OF ADMISSION:  11/13/2010 DATE OF DISCHARGE:  11/14/2010                              DISCHARGE SUMMARY   DISCHARGE DIAGNOSES: 1. 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     a.     Cardiac, etiology ruled out with unchanged EKG and negative      cardiac enzymes x3.     b.     Normal catheterization in June 2009. 2. Neuropathic left lower extremity pain. 3. Alcohol abuse. 4. Tobacco abuse. 5. Mild aortic stenosis. 6. Chronic back pain secondary to spinal stenosis see in MRI 2002 and     had a repeat MRI in 2010, which showed multilevel advanced     degenerative changes. 7. Hypertension.  DISCHARGE MEDICATIONS WITH ACCURATE DOSES: 1. Aspirin 81 mg take one tablet by mouth daily. 2. Hydrochlorothiazide 25 mg take one tablet by mouth daily. 3. Lisinopril 40 mg take one tablet by mouth daily. 4. Nitroglycerin CR capsule take 2.5 mg by mouth as needed for chest     pain. 5. Amlodipine 10 mg take one capsule by mouth once daily. 6. Oxycodone IR 5 mg tablet take one tablet every 6 hours as needed     for pain. 7. Omeprazole 40 mg take one tablet by mouth daily. 8. Albuterol 108 mcg inhaler take one to two puffs every 4-6 hours as     needed for pain. 9. Neurontin 300 mg take three capsules by mouth twice daily for leg     pain.  DISPOSITION AND FOLLOWUP:  The patient was discharged home in stable and improved condition from [facility] on Nov 14, 2010.  The patient has been scheduled a followup appointment with Dr. Sherryll Burger on November 25, 2010 at [facility].  The patient was very concerned for his neuropathic left lower extremity pain for which his Neurontin dose was increased during this hospital  admission from 600 mg t.i.d. to 900 mg t.i.d.  The patient also had a CT of spine in April 2012, which showed some right thyroid hypodensity and for which ultrasound was recommended.  We can get that ultrasound on outpatient basis.  PROCEDURES PERFORMED:  Chest x-ray on Nov 13, 2010, which showed no active cardiopulmonary disease.  CONSULTATIONS:  None.  BRIEF ADMITTING HISTORY AND PHYSICAL:  [name] is a 65 year old with past medical history significant for hypertension, spinal stenosis who presented to ER with chest pain.  He states his chest pain began on the morning of his day of admission.  It was located in the substernal to epigastric region with no radiation.  The pain has been 9/10 and describes the pain as sharp.  He had some associated on and off sweating and the pain was accompanied by nausea, but no vomiting.  He denied any relieving or aggravating factors such as position change or inspiration or expiration.  He also denies any shortness  of breath or cough.  The patient states his pain was similar to the pain he had in 2009, where he was admitted to rule out ACS.  Of note, the patient admits to drinking a gallon of moonshine alcohol the night prior to his admission.  PHYSICAL EXAMINATION:  VITAL SIGNS:  Temperature 98.6, blood pressure 130/82, heart rate 81, respiratory rate 19, O2 sats 98% on room air. GENERAL:  Alert and cooperative. HEENT:  Head normal without obvious abnormality, atraumatic.  Eyes: Conjunctivae/cornea clear.  Pupils are equal, round, and reactive to light.  Extraocular movements intact. NECK:  No adenopathy.  No carotid bruits. LUNGS:  Clear to auscultation bilaterally. HEART:  2/6 ejection systolic murmur heard, regular rate and rhythm. ABDOMEN:  Epigastric tenderness to palpation, bowel sounds present, no distention, no hepatosplenomegaly. EXTREMITIES:  No edema or cyanosis noted. PULSES:  2+ and symmetric. SKIN:  Dry skin noted over lower  extremities, no rashes. NEUROLOGIC:  Grossly normal.  LABS ON ADMISSION:  White cell count 6.7, hemoglobin 14.6, hematocrit 41.2, platelets 190.  Sodium 143, potassium 3.6, chloride 101, bicarb 27, BUN 17, creatinine 0.83, glucose of 73.  HOSPITAL COURSE BY PROBLEM: 1. 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  The     patient admits drinking a gallon of moonshine on the night prior to     his admission.  Cardiac etiology to the chest pain was ruled out     with unchanged EKGs and negative cardiac enzymes x3.  The patient     also had normal echo and normal cath and his EGD in 2009 showed     some evidence of gastritis.  The patient's chest pain got better     with GI cocktail Protonix and some morphine. His chest pain     resolved completely at the time of discharge. 2. Left lower extremity pain likely secondary to peripheral neuropathy     in the setting of lumbar spinal stenosis.  There was a concern of     claudication with history of prolonged smoking; however, recent     ABIs done in May 2012 were completely within normal limits.  We     increased the dose of his Neurontin and continued him on the home     dose of oxycodone. 3. Alcohol abuse.  The patient was placed on CIWA protocol and he was     counseled on alcohol cessation.  We also got a Child psychotherapist     consult for his alcohol abuse, but it seems the patient is not     ready as he lacks the insight.  He thinks that he drinks only     at the weekend, which is fine.  DISCHARGE LABS AND VITALS:  Temperature 98.3, pulse 80, respiratory rate 20, blood pressure 148/87, O2 sats 100% on 2 liters.  His discharge labs include white cell count 7.1, hemoglobin 13.7, hematocrit 39, platelet 177.  Sodium 139, potassium 4, chloride 100, bicarb 27, glucose 83, BUN 15, creatinine 0.79.    ______________________________ Elyse Jarvis, MD   ______________________________ Blanch Media, M.D.    MS/MEDQ  D:   11/15/2010  T:  11/16/2010  Job:  [id number]  cc:   Clerance Lav, MD PhD  Electronically Signed by Elyse Jarvis MD on 11/21/2010 02:39:43 PM Electronically Signed by Blanch Media M.D. on 11/28/2010 12:03:25 PM

## 2010-11-28 NOTE — Telephone Encounter (Signed)
Verbal order given to [facility] for bedside commode and walker for pt per order of Dr. Sherryll Burger.

## 2010-12-06 ENCOUNTER — Encounter: Payer: Self-pay | Admitting: Internal Medicine

## 2010-12-06 ENCOUNTER — Ambulatory Visit (INDEPENDENT_AMBULATORY_CARE_PROVIDER_SITE_OTHER): Payer: Medicare Other | Admitting: Internal Medicine

## 2010-12-06 DIAGNOSIS — K219 Gastro-esophageal reflux disease without esophagitis: Secondary | ICD-10-CM

## 2010-12-06 DIAGNOSIS — M545 Low back pain, unspecified: Secondary | ICD-10-CM

## 2010-12-06 DIAGNOSIS — F17200 Nicotine dependence, unspecified, uncomplicated: Secondary | ICD-10-CM

## 2010-12-06 DIAGNOSIS — I10 Essential (primary) hypertension: Secondary | ICD-10-CM

## 2010-12-06 MED ORDER — AMLODIPINE BESYLATE 10 MG PO TABS: 10.0000 mg | ORAL_TABLET | Freq: Every day | ORAL | Status: DC

## 2010-12-06 MED ORDER — OMEPRAZOLE 40 MG PO CPDR: 40.0000 mg | DELAYED_RELEASE_CAPSULE | Freq: Every day | ORAL | Status: DC

## 2010-12-06 MED ORDER — HYDROCHLOROTHIAZIDE 25 MG PO TABS
25.0000 mg | ORAL_TABLET | Freq: Every day | ORAL | Status: DC
Start: 2010-12-06 — End: 2011-07-31

## 2010-12-06 MED ORDER — LISINOPRIL 40 MG PO TABS: 40.0000 mg | ORAL_TABLET | Freq: Every day | ORAL | Status: DC

## 2010-12-06 MED ORDER — GABAPENTIN 300 MG PO CAPS: 300.0000 mg | ORAL_CAPSULE | Freq: Three times a day (TID) | ORAL | Status: DC

## 2010-12-06 NOTE — Patient Instructions (Signed)
Return in one month.  

## 2010-12-06 NOTE — Assessment & Plan Note (Signed)
Has not yet quit. Not ready to quit. His poor communication (verbal speech) and illiteracy make it hard to provide more encouragement.

## 2010-12-06 NOTE — Assessment & Plan Note (Signed)
BP stable. No changes made in his meds. I refilled his meds.

## 2010-12-06 NOTE — Progress Notes (Signed)
  Subjective:    Patient ID: [name], male    DOB: [birth year], 65 y.o.   MRN: [id number]  HPI The patient is a 65 years old gentleman who was recently hospitalised after a fall. He has chronic back pain and lumbar stenosis. He is here for follow up and works with physical therapist. He says the physical therapy is not helping too much. His gait is better and he looks more able to walk. However, his pain is still not fully controlled. He has no other complain and request refill on the meds.     Review of Systems  All other systems reviewed and are negative.       Objective:   Physical Exam  Constitutional: He is oriented to person, place, and time. He appears well-developed and well-nourished.  HENT:  Head: Normocephalic and atraumatic.  Right Ear: External ear normal.  Left Ear: External ear normal.  Eyes: Conjunctivae and EOM are normal. Pupils are equal, round, and reactive to light. Right eye exhibits no discharge. Left eye exhibits no discharge.  Neck: Normal range of motion. Neck supple. No thyromegaly present.  Cardiovascular: Normal rate and regular rhythm.   Murmur heard.      Systolic murmur 2/6  Pulmonary/Chest: Effort normal and breath sounds normal. No respiratory distress. He has no wheezes. He has no rales.  Abdominal: Soft. Bowel sounds are normal. He exhibits distension. He exhibits no mass. There is no tenderness. There is no rebound and no guarding.  Musculoskeletal: Normal range of motion.  Neurological: He is alert and oriented to person, place, and time. He has normal reflexes. No cranial nerve deficit. Coordination normal.  Skin: No rash noted. He is not diaphoretic. No erythema.  Psychiatric: He has a normal mood and affect. His behavior is normal. Judgment and thought content normal.          Assessment & Plan:

## 2010-12-06 NOTE — Assessment & Plan Note (Signed)
On stable dose of narcotics. Doing better after fall. His UDS was negative for oxycodone, which remains unclear. Patient reports that he does not take medicine regularly and he did not take it today. So i have not retested him. But it will be reasonable to test him once again. Overall, he has been stable and reliable narcotic user for last three years I have know him.

## 2010-12-19 ENCOUNTER — Other Ambulatory Visit: Payer: Self-pay | Admitting: Internal Medicine

## 2010-12-19 ENCOUNTER — Encounter: Payer: Self-pay | Admitting: Internal Medicine

## 2010-12-19 ENCOUNTER — Ambulatory Visit (INDEPENDENT_AMBULATORY_CARE_PROVIDER_SITE_OTHER): Payer: Medicare Other | Admitting: Internal Medicine

## 2010-12-19 VITALS — BP 117/73 | HR 55 | Temp 97.1°F | Ht 74.0 in | Wt 277.5 lb

## 2010-12-19 DIAGNOSIS — M48061 Spinal stenosis, lumbar region without neurogenic claudication: Secondary | ICD-10-CM

## 2010-12-19 DIAGNOSIS — I10 Essential (primary) hypertension: Secondary | ICD-10-CM

## 2010-12-19 DIAGNOSIS — M25519 Pain in unspecified shoulder: Secondary | ICD-10-CM

## 2010-12-19 LAB — GLUCOSE, CAPILLARY: Glucose-Capillary: 87 mg/dL (ref 70–99)

## 2010-12-19 MED ORDER — OXYCODONE HCL 5 MG PO CAPS: 5.0000 mg | ORAL_CAPSULE | Freq: Four times a day (QID) | ORAL | Status: DC | PRN

## 2010-12-19 NOTE — Assessment & Plan Note (Signed)
Stable. Continue pain medications

## 2010-12-19 NOTE — Progress Notes (Signed)
  Subjective:    Patient ID: [name], male    DOB: 1945/11/21, 65 y.o.   MRN: [id number]  HPI 65 years old male here for follow up. He is doing well, and progressing well with his mobility. He has home aid who helps him 2 hrs/day for five days. And it seems to be helping him a lot. However, he requests more hours from the aid. He also reports that one of his BP med is causing him to itch. When he stopped taking all three of them (HCTZ, Norvasc and Lisinopril), the itch stopped. He always had this itching but he had attributed it to his washing powder.  Unfortunately, since he takes all three of them at the same time, he does not know which one of them is culprit. He has no other complain.    Review of Systems  Constitutional: Negative for fever, chills, activity change and appetite change.  HENT: Negative for nosebleeds, facial swelling, neck pain and tinnitus.   Eyes: Negative for pain, discharge and visual disturbance.  Respiratory: Negative for cough, chest tightness and shortness of breath.   Cardiovascular: Negative for chest pain and palpitations.  Gastrointestinal: Negative for nausea, vomiting, abdominal pain, blood in stool and abdominal distention.  Musculoskeletal: Positive for back pain.       Sharp burning pains in the left leg  Skin: Negative for rash.  Neurological: Negative for dizziness, seizures, weakness and headaches.  Psychiatric/Behavioral: Negative for suicidal ideas, confusion and agitation.       Objective:   Physical Exam  Constitutional: He is oriented to person, place, and time. He appears well-developed and well-nourished.  HENT:  Head: Normocephalic and atraumatic.  Right Ear: External ear normal.  Left Ear: External ear normal.  Eyes: Conjunctivae and EOM are normal. Pupils are equal, round, and reactive to light. Right eye exhibits no discharge. Left eye exhibits no discharge.  Neck: Normal range of motion. Neck supple. No thyromegaly  present.  Cardiovascular: Normal rate and regular rhythm.   Murmur heard.      Systolic murmur 2/6  Pulmonary/Chest: Effort normal and breath sounds normal. No respiratory distress. He has no wheezes. He has no rales.  Abdominal: Soft. Bowel sounds are normal. He exhibits distension. He exhibits no mass. There is no tenderness. There is no rebound and no guarding.  Musculoskeletal: Normal range of motion.  Neurological: He is alert and oriented to person, place, and time. He has normal reflexes. No cranial nerve deficit. Coordination normal.  Skin: No rash noted. He is not diaphoretic. No erythema.  Psychiatric: He has a normal mood and affect. His behavior is normal. Judgment and thought content normal.          Assessment & Plan:

## 2010-12-19 NOTE — Patient Instructions (Signed)
Stop one BP medication at a time to help figure out which one is causing the reaction. Return in one month.

## 2010-12-19 NOTE — Assessment & Plan Note (Signed)
I have asked him to stop taking one of the 3 medications at the time couple days and see if the itching goes away. He was start with lisinopril then move on to Norvasc and hydrochlorothiazide. His blood pressures were controlled but we might have to find an alternative once the culprit for his itching is discovered

## 2011-01-18 ENCOUNTER — Other Ambulatory Visit: Payer: Self-pay | Admitting: *Deleted

## 2011-01-18 ENCOUNTER — Ambulatory Visit (INDEPENDENT_AMBULATORY_CARE_PROVIDER_SITE_OTHER): Payer: Medicare Other | Admitting: *Deleted

## 2011-01-18 DIAGNOSIS — G5790 Unspecified mononeuropathy of unspecified lower limb: Secondary | ICD-10-CM

## 2011-01-18 DIAGNOSIS — M48061 Spinal stenosis, lumbar region without neurogenic claudication: Secondary | ICD-10-CM

## 2011-01-18 MED ORDER — OXYCODONE HCL 5 MG PO CAPS: 5.0000 mg | ORAL_CAPSULE | Freq: Four times a day (QID) | ORAL | Status: AC | PRN

## 2011-01-18 NOTE — Telephone Encounter (Signed)
Scripts given

## 2011-01-18 NOTE — Progress Notes (Signed)
Pt here today; picked up medication rx.

## 2011-02-17 ENCOUNTER — Other Ambulatory Visit: Payer: Self-pay | Admitting: *Deleted

## 2011-02-17 DIAGNOSIS — M48061 Spinal stenosis, lumbar region without neurogenic claudication: Secondary | ICD-10-CM

## 2011-02-17 NOTE — Telephone Encounter (Signed)
This patient has pan negative UDS despite large doses of percocet being prescribed. Very concerning for misuse/diversion. D/W triage RN who will speak with patient. He needs to schedule an appointment. No script will be given when he walks in as he has done the past few months. He also does not have a contract on his chart.  Needs to be seen will likely no longer prescribe opiates.

## 2011-02-17 NOTE — Telephone Encounter (Signed)
Encounter noted. We will likely get him on contract explain expectations then bring him back for UDS and if negative will stop prescribing controlled medications. Will need to document specifically how he is taking his medications and have him bring in pill bottles.

## 2011-02-17 NOTE — Telephone Encounter (Signed)
After speaking w/ dr Phillips Odor, pt was brought to triage office, explained that he would not receive a narcotic script today, that he must request scripts appr 3 days before they are due, also informed pt that he would need to sign a narcotic contract, appt made w/ dr Dorise Hiss tues 8/28 at 1500. Gave pt pink appt card for that appt as well as 9/7 at 1500 w/ dr Candy Sledge. Pt states he "shows up" w/o notice because of scheduling transportation, that he must call and request several days ahead of time, i informed him he could call [facility] first request medication and then call and schedule the transportation, he said "[id number]". All in all he was pleasant when informed no script today.

## 2011-02-21 ENCOUNTER — Ambulatory Visit (INDEPENDENT_AMBULATORY_CARE_PROVIDER_SITE_OTHER): Payer: Medicare Other | Admitting: Internal Medicine

## 2011-02-21 ENCOUNTER — Encounter: Payer: Self-pay | Admitting: Internal Medicine

## 2011-02-21 VITALS — BP 148/85 | HR 73 | Temp 98.0°F | Ht 74.0 in | Wt 283.2 lb

## 2011-02-21 DIAGNOSIS — M549 Dorsalgia, unspecified: Secondary | ICD-10-CM

## 2011-02-21 MED ORDER — GABAPENTIN 300 MG PO CAPS: 300.0000 mg | ORAL_CAPSULE | Freq: Three times a day (TID) | ORAL | Status: DC

## 2011-02-21 MED ORDER — OXYCODONE HCL 5 MG PO CAPS: 5.0000 mg | ORAL_CAPSULE | Freq: Four times a day (QID) | ORAL | Status: DC | PRN

## 2011-02-21 NOTE — Progress Notes (Signed)
Subjective:    Patient ID: JOEY LIERMAN, male    DOB: [birth year], 65 y.o.   MRN: [id number]  HPI: The patient is a 65 year old male who comes in today for an acute visit for narcotic medication refill. He has also been asked to come in today to sign a pain contract for his pain medicine. He has also been having some leg pain recently that he describes as a burning pain, and pins and needles. It's been worse for the last several weeks. He chronically has back pain. He states that he has been taking his back pain medication which is OxyIR for his leg pain. He states that it helps a little it but it isn't really doing much. It's worse when he is walking around. His back pain is pretty well controlled with this OxyIR, and he takes it up to 4 times a day. He states that there is some confusion with when he needs to call versus when he needs to call for a ride in order to come get his medications refilled. I have informed him that he needs to call 3 days prior to coming into our clinic. I did tell him that when he calls and he will be able to talk to someone who will let the front desk know he's coming when he says to be coming for his prescriptions. We did talk extensively about his pain medication contract at today's visit. He is not experiencing any fevers, chills, weight loss, change in bowel habits, bowel or bladder incontinence. She does state that he is taking his blood pressure medications as prescribed. He also takes his heartburn medication as ordered. He has albuterol inhaler that he uses he needs it. He states that he does not really have any problems with shortness of breath however.    Review of Systems  Constitutional: Negative.  Negative for fever, chills, activity change, appetite change, fatigue and unexpected weight change.  HENT: Negative.   Eyes: Negative.   Respiratory: Negative.  Negative for cough, chest tightness and shortness of breath.   Cardiovascular: Negative.  Negative for  chest pain, palpitations and leg swelling.  Gastrointestinal: Negative for nausea, vomiting, abdominal pain, diarrhea and abdominal distention.  Musculoskeletal: Positive for back pain and gait problem.       Patient states that his back pain this could cause him to lose his balance and he has to grab at things in order to avoid falling. He states he hasn't fallen in the last 2-3 weeks.  Skin: Negative.   Neurological: Positive for speech difficulty and numbness. Negative for dizziness, tremors, seizures, syncope, facial asymmetry, weakness, light-headedness and headaches.       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 Patient states he has a pins and needles sensation in his legs.  Hematological: Negative.   Psychiatric/Behavioral: Negative.     Vitals: Blood pressure: 148/85 Pulse: 73 Temperature: 40F Height 6 feet 2 inches Weight 283 pounds    Objective:   Physical Exam  Constitutional: He is oriented to person, place, and time. He appears well-developed and well-nourished.  HENT:  Head: Normocephalic and atraumatic.  Eyes: EOM are normal. Pupils are equal, round, and reactive to light.  Neck: Normal range of motion. Neck supple. No tracheal deviation present. No thyromegaly present.  Cardiovascular: Normal rate, regular rhythm and normal heart sounds.   Pulmonary/Chest: Effort normal and breath sounds normal. No respiratory distress. He has no wheezes. He has no rales. He exhibits  no tenderness.  Abdominal: Soft. Bowel sounds are normal. He exhibits no distension. There is no tenderness. There is no rebound and no guarding.  Musculoskeletal: Normal range of motion. He exhibits tenderness.  Lymphadenopathy:    He has no cervical adenopathy.  Neurological: He is alert and oriented to person, place, and time. No cranial nerve deficit.  Skin: Skin is warm and dry. No rash noted. No erythema. No pallor.  Psychiatric: He has a normal mood and  affect. His behavior is normal. Judgment and thought content normal.          Assessment & Plan:  1. Pain medication refill-patient was seen today and was given a prescription for OxyIR 5 mg 120 tablets to be taken every 6 hours up to 4 times daily for pain. We did talk extensively about the conditions of his pain contract. He was an understanding and in agreement with all the causes of the pain contract. We did sign this. I did inform him of the one pharmacy rule. I did tell him to bring all his medications to every visit. I did inform him he may have to give a urine sample at next visit. He was given a copy of his pain contract to take home with him. I did discuss with him extensively the policies and rules regarding calling in for refills versus just stopping to pick them up. I did let him know of his appointment with Dr. Candy Sledge on September 7. He does plan to come. I did tell him to bring his medications with him. I did tell him to expect to give a urine sample at that visit. We did sign a pain contract her for OxyIR 5 mg 120 tablets per month.  2. Leg pain-patient does appear to have leg pain that is neurologic in etiology. It would appear that gabapentin is on his medication list however he states that he does not remember taking in the past and he is not taking it now. I will re-prescribe that today and have informed him on the usage. I did tell him to take 1 pill at bedtime for 2 or 3 days to make sure it doesn't make him excessively sleepy. Then if he is not he may change use to 2 times a day. Taking it once in the morning and once in the evening. I told him that he can take up to 3 times a day. I have written this on his discharge instructions. Please reevaluate his pain control at next visit.  3. Other medical problems not addressed at today's visit: Hyperlipidemia, history of tobacco abuse, GERD, renal cyst, hammertoe.  3. Disposition-patient we see him back with his PCP Dr. Candy Sledge on  September 7 at 3 PM. I have put this date and time on his papers. He was given a copy of his pain contract at today's visit. He was given a prescription for gabapentin 90 pills with 0 refills and a prescription for OxyIR 5 mg 120 pills no refills. His blood pressure is well-controlled today. No change to his other medications at today's visit. He is not due for any lab work at today's visit.

## 2011-02-21 NOTE — Patient Instructions (Addendum)
You were seen today for your pain. We did sign a pain contract today and talk about what the expectations are for you. You need to bring your pill bottles to all of your visits. We may take urine samples when you come for appointments. You will only go to one pharmacy. You are not to request early refills, and only your primary doctor can make changes to this contract once it is started. You were given a copy of your pain contract today. We are prescribing Oxycodone (Oxy IR) 5 mg, 120 pills/month for pain medication for you today. We will also prescribe a medicine for your leg pain called gabapentin (neurontin) 300 mg that you should take at bedtime for 2-3 days. If it does not make you too sleepy you can try taking one in the morning and one at nighttime. If it makes you too tired then stop taking it in the morning. You can take it up to 3 times per day if it helps with the pain. Please also bring this to your next visit. You have an appointment with your regular doctor, Dr. Candy Sledge on September 7th, 2012 at 3:00 in the afternoon. Please be on time to this visit and bring all your medications with you to that visit.

## 2011-02-22 NOTE — Progress Notes (Signed)
Dr. Dorise Hiss discussed this patient with me. I agree with her assessment and plan.

## 2011-03-03 ENCOUNTER — Encounter: Payer: Self-pay | Admitting: Internal Medicine

## 2011-03-03 ENCOUNTER — Encounter: Payer: Medicare Other | Admitting: Internal Medicine

## 2011-03-03 ENCOUNTER — Ambulatory Visit (INDEPENDENT_AMBULATORY_CARE_PROVIDER_SITE_OTHER): Payer: Medicare Other | Admitting: Internal Medicine

## 2011-03-03 DIAGNOSIS — G5790 Unspecified mononeuropathy of unspecified lower limb: Secondary | ICD-10-CM

## 2011-03-03 DIAGNOSIS — M48061 Spinal stenosis, lumbar region without neurogenic claudication: Secondary | ICD-10-CM

## 2011-03-03 NOTE — Progress Notes (Signed)
  Subjective:    Patient ID: [name], male    DOB: [birth year], 65 y.o.   MRN: [id number]  HPI [name] is a pleasant 64 year old man with past with history of lumbar spinal stenosis, lower extremity neuropathy, DM 2 who comes the clinic for followup of his leg pain. He was seen by Dr. Dorise Hiss on August 28 12 went he was started on pain contract for his oxycodone IR. He is also taking Neurontin 300 mg 3 times a day. He he says that his back is well-controlled but he has left lower extremity pain which is still not controlled well. This pain is deep and sharp at times, intermittent, happens almost every day, increases with walking long distance. Minimal pain at rest, rest helps the pain. He was seen by surgery in the past and was told that surgical options for his lumbar spine disease has 50-50 prognosis and so he did not want to have surgery at that time, also does not want to have at this time. He also had physical therapy in past which helped him little.  I explained him in detail about the course of his disease and expectations for his pain control. I explained that 100% pain control would not be possible and he has to follow the recommendations of physical therapy along with taking medications as prescribed and find balance between his regular life and amount of pain he can tolerate. He sounded understanding and doesn't want to get any surgical referral for now or any alternate therapy in terms of acupuncture etc.  He denies any chest pain, short of breath, headache, abdominal pain, nausea vomiting, fever, chills, diarrhea.  Review of Systems    as per history of present illness, all other systems reviewed and negative. Objective:   Physical Exam Constitutional: Vital signs reviewed.  Patient is a well-developed and well-nourished in no acute distress and cooperative with exam. Alert and oriented x3.  Head: Normocephalic and atraumatic Mouth: no erythema or exudates, MMM Eyes: PERRL,  EOMI, conjunctivae normal, No scleral icterus.  Neck: Supple, Trachea midline normal ROM, No JVD, mass, thyromegaly, or carotid bruit present.  Cardiovascular: RRR, S1 normal, S2 normal, systolic murmur Pulmonary/Chest: CTAB, no wheezes, rales, or rhonchi Abdominal: Soft. Non-tender, non-distended, bowel sounds are normal, no masses, organomegaly, or guarding present.  Musculoskeletal: Minimal decrease in range of motion of lumbar spine due to pain. Left paraspinal minimal tenderness.Trace pedal edema bilaterally.  Neurological: A&O x3, Strenght is normal and symmetric bilaterally, cranial nerve II-XII are grossly intact, no focal motor deficit, sensory intact to light touch bilaterally.  Skin: Warm, dry and intact. No rash, cyanosis, or clubbing.          Assessment & Plan:

## 2011-03-03 NOTE — Patient Instructions (Signed)
Please make followup appointment with primary care in 2-3 months. Please continue taking Neurontin and oxycodone as prescribed for your pain. Also continue walking and doing exercises explained by physical therapist for your back pain.

## 2011-03-03 NOTE — Assessment & Plan Note (Signed)
As in history of present illness, detailed discussion about 20-30 minutes done for pain in the disease process and of expectations for pain with it. He will continue taking oxycodone IR and Neurontin for pain.

## 2011-03-03 NOTE — Assessment & Plan Note (Addendum)
Does not want surgical  Re-evaluation or  Alternative therapy in terms of acupuncture etc.  He will continue taking oxycodone IR and Neurontin as prescribed. 20-30 minutes of discussion about his spine and leg pain over course of it and expectations of the course of disease was done and he opted to continue medications and exercise. Please read history of present illness for further details. He'll be followed up by his primary care in 2-3 months.

## 2011-03-20 ENCOUNTER — Other Ambulatory Visit: Payer: Self-pay | Admitting: *Deleted

## 2011-03-20 DIAGNOSIS — M545 Low back pain: Secondary | ICD-10-CM

## 2011-03-20 MED ORDER — OXYCODONE HCL 5 MG PO CAPS: 5.0000 mg | ORAL_CAPSULE | Freq: Four times a day (QID) | ORAL | Status: DC | PRN

## 2011-03-20 NOTE — Telephone Encounter (Signed)
Noted FYI - UDS 5/12 was negative for opioids even though had taken them that AM. Will refill but send off UDS when he shows up to pick up script. Pls do not inform him of UDS requirement until he shows up to get script. Must provide UDS before he gets Rx and cannot leave [facility] without giving UDS.

## 2011-03-21 NOTE — Telephone Encounter (Signed)
i finally spoke w/ pt this am after 5 ph calls, he states he will come fri around 1000 to pick up script, he was not told he would be giving a sample of urine.

## 2011-03-21 NOTE — Telephone Encounter (Signed)
Is there anyway you could ask him and document his last percocet dose? Thanks

## 2011-03-21 NOTE — Telephone Encounter (Signed)
Will do when he comes in before he is ask for sample.

## 2011-03-23 LAB — COMPREHENSIVE METABOLIC PANEL
ALT: 27
Alkaline Phosphatase: 48
BUN: 13
CO2: 27
Calcium: 9.7
GFR calc non Af Amer: >60
Glucose, Bld: 146 — ABNORMAL HIGH
Sodium: 136

## 2011-03-23 LAB — LIPID PANEL
HDL: 54
Total CHOL/HDL Ratio: 3.1
VLDL: 18

## 2011-03-23 LAB — CBC
HCT: 40.8
HCT: 46.7
Hemoglobin: 14.2
Hemoglobin: 15.6
MCHC: 33.5
MCHC: 34.7
Platelets: 201
RBC: 4.75
RDW: 14.7
RDW: 14.8

## 2011-03-23 LAB — DRUGS OF ABUSE SCREEN W/O ALC, ROUTINE URINE
Cocaine Metabolites: NEGATIVE
Creatinine,U: 270.3
Phencyclidine (PCP): NEGATIVE
Propoxyphene: NEGATIVE

## 2011-03-23 LAB — BASIC METABOLIC PANEL
BUN: 9
CO2: 28
GFR calc non Af Amer: >60
Glucose, Bld: 86
Potassium: 3.7
Sodium: 137

## 2011-03-23 LAB — CARDIAC PANEL(CRET KIN+CKTOT+MB+TROPI)
CK, MB: 2
CK, MB: 2.3
Relative Index: 1.5
Total CK: 139

## 2011-03-23 LAB — PROTIME-INR
INR: 1
Prothrombin Time: 12.9

## 2011-03-23 LAB — TSH: TSH: 2.206

## 2011-03-23 LAB — HEMOGLOBIN A1C
Hgb A1c MFr Bld: 6
Mean Plasma Glucose: 136

## 2011-03-24 ENCOUNTER — Other Ambulatory Visit: Payer: Medicare Other

## 2011-03-24 DIAGNOSIS — M545 Low back pain: Secondary | ICD-10-CM

## 2011-03-24 NOTE — Telephone Encounter (Signed)
Pt presented to pick up script, he was then ask for urine sample and the last time he had taken oxy-ir 5mg , he states 1 this am at 0700. Urine obtained and presented to lab

## 2011-03-28 LAB — PRESCRIPTION ABUSE MONITORING 15P, URINE
Barbiturate Screen, Urine: NEGATIVE NG/ML
Benzodiazepine Screen, Urine: NEGATIVE NG/ML
Buprenorphine, Urine: NEGATIVE NG/ML
Cannabinoid Scrn, Ur: NEGATIVE NG/ML
Carisoprodol, Urine: NEGATIVE NG/ML
Methadone Screen, Urine: NEGATIVE NG/ML
Opiate Screen, Urine: NEGATIVE NG/ML
Oxycodone Screen, Ur: NEGATIVE NG/ML
Propoxyphene: NEGATIVE NG/ML
Tramadol Scrn, Ur: NEGATIVE NG/ML

## 2011-03-28 LAB — GLUCOSE, CAPILLARY: Glucose-Capillary: 95

## 2011-04-05 ENCOUNTER — Encounter: Payer: Self-pay | Admitting: Internal Medicine

## 2011-04-05 NOTE — Progress Notes (Signed)
Pt received 30 day supply of oxycodone on 7/25, 8/28, and 9/28. Pt reported taking a dose on the AM of 9/28 (therfore had not run out) yet UDS on 9/28 was inappropriately negative. Result of UDS has to be scanned in bc not flowing into [employer]. [address] database and confirmed pt picking up med q 30 days. Pt also had inapp negative UDS on 11/18/2010. Therefore, we cannot continue to prescibe controlled substances. Will get pt in for appt to inform. Will change FYI.

## 2011-04-06 ENCOUNTER — Encounter: Payer: Self-pay | Admitting: Internal Medicine

## 2011-04-10 LAB — CARDIAC PANEL(CRET KIN+CKTOT+MB+TROPI)
CK, MB: 1.9
Troponin I: 0.09 — ABNORMAL HIGH
Troponin I: 0.12 — ABNORMAL HIGH

## 2011-04-10 LAB — COMPREHENSIVE METABOLIC PANEL
ALT: 35
AST: 28
CO2: 24
Chloride: 107
GFR calc Af Amer: >60
GFR calc non Af Amer: >60
Glucose, Bld: 80
Sodium: 138
Total Bilirubin: 0.7

## 2011-04-10 LAB — RAPID URINE DRUG SCREEN, HOSP PERFORMED
Benzodiazepines: NOT DETECTED
Cocaine: NOT DETECTED
Opiates: POSITIVE — AB
Tetrahydrocannabinol: NOT DETECTED

## 2011-04-10 LAB — CBC
HCT: 40
HCT: 44.8
Hemoglobin: 13.4
Hemoglobin: 15
MCV: 96.6
Platelets: 181
Platelets: 221
RBC: 4.64
WBC: 6.6
WBC: 7.4

## 2011-04-10 LAB — I-STAT 8, (EC8 V) (CONVERTED LAB)
BUN: 11
Bicarbonate: 23.6
Hemoglobin: 16.7
Operator id: 198171
Sodium: 140
TCO2: 25
pCO2, Ven: 35.8 — ABNORMAL LOW

## 2011-04-10 LAB — BASIC METABOLIC PANEL
BUN: 11
CO2: 28
Chloride: 105
Glucose, Bld: 66 — ABNORMAL LOW
Potassium: 4.3

## 2011-04-10 LAB — LIPID PANEL
HDL: 79
Total CHOL/HDL Ratio: 2.3
VLDL: 17

## 2011-04-10 LAB — POCT I-STAT CREATININE
Creatinine, Ser: 1
Operator id: 198171

## 2011-04-10 LAB — B-NATRIURETIC PEPTIDE (CONVERTED LAB): Pro B Natriuretic peptide (BNP): 40

## 2011-04-10 LAB — POCT CARDIAC MARKERS
CKMB, poc: 1.2
CKMB, poc: 1.7
Troponin i, poc: 0.09 — ABNORMAL HIGH

## 2011-04-17 ENCOUNTER — Encounter: Payer: Self-pay | Admitting: Internal Medicine

## 2011-04-17 ENCOUNTER — Ambulatory Visit (INDEPENDENT_AMBULATORY_CARE_PROVIDER_SITE_OTHER): Payer: Medicare Other | Admitting: Internal Medicine

## 2011-04-17 VITALS — BP 123/63 | HR 71 | Temp 97.2°F | Ht 74.0 in | Wt 285.6 lb

## 2011-04-17 DIAGNOSIS — M549 Dorsalgia, unspecified: Secondary | ICD-10-CM

## 2011-04-17 DIAGNOSIS — G8929 Other chronic pain: Secondary | ICD-10-CM

## 2011-04-17 MED ORDER — GABAPENTIN 300 MG PO CAPS: 600.0000 mg | ORAL_CAPSULE | Freq: Three times a day (TID) | ORAL | Status: DC

## 2011-04-17 MED ORDER — ETODOLAC 300 MG PO CAPS: 300.0000 mg | ORAL_CAPSULE | Freq: Three times a day (TID) | ORAL | Status: DC

## 2011-04-17 NOTE — Progress Notes (Deleted)
  Subjective:    Patient ID: [name], male    DOB: [birth year], 65 y.o.   MRN: [id number]  HPI    Review of Systems     Objective:   Physical Exam        Assessment & Plan:

## 2011-04-17 NOTE — Patient Instructions (Signed)
You will not be prescribed any opioid medications at our clinic. Please, start taking Meloxicam for your back pain. Please, note that there is an increase in the dose of Gabapentin (neurontin) ->this should help you with "crawling sensation." Please, let us know if you wish to repeat a physical therapy treatment course. Call with any questions and follow with Dr. Candy Sledge, your primary care doctor on as needed basis.

## 2011-04-17 NOTE — Progress Notes (Signed)
HPI: 1. Refill on his "pain pills."   Past Medical History  Diagnosis Date  . Hypertension   . Back pain, chronic     MRI 2002, spinal stenosis mild to moderate L2-3 and L3-4, advanced degernerative changes MRI 2010  . Aortic stenosis, mild     1.53 sqcm 9/07, now 2.73 (11/2009) with moderate regurgitation  . Enlarged LA (left atrium)     mild dilatation per echo  . Chest pain, unspecified     neg cath 2003, neg myoview 2008   Current Outpatient Prescriptions  Medication Sig Dispense Refill  . albuterol (VENTOLIN HFA) 108 (90 BASE) MCG/ACT inhaler Inhale into the lungs. Take 1-2 puffs every 4-6 hours as needed.       [name] amLODipine (NORVASC) 10 MG tablet Take 1 tablet (10 mg total) by mouth daily.  30 tablet  6  . aspirin 81 MG EC tablet Take 81 mg by mouth daily.        [name] etodolac (LODINE) 300 MG capsule Take 1 capsule (300 mg total) by mouth every 8 (eight) hours.  90 capsule  5  . gabapentin (NEURONTIN) 300 MG capsule Take 2 capsules (600 mg total) by mouth 3 (three) times daily. Take 3 capsules by mouth three times daily.  90 capsule  3  . hydrochlorothiazide 25 MG tablet Take 1 tablet (25 mg total) by mouth daily.  30 tablet  6  . lisinopril (PRINIVIL,ZESTRIL) 40 MG tablet Take 1 tablet (40 mg total) by mouth daily.  30 tablet  6  . nitroGLYCERIN (NITROGLYCERIN) 2.5 MG CR capsule Take 2.5 mg by mouth as needed.        [name] omeprazole (PRILOSEC) 40 MG capsule Take 1 capsule (40 mg total) by mouth daily.  30 capsule  11  . DISCONTD: gabapentin (NEURONTIN) 300 MG capsule Take 2 capsules (600 mg total) by mouth 3 (three) times daily.  90 capsule  2  . DISCONTD: gabapentin (NEURONTIN) 300 MG capsule Take 1 capsule (300 mg total) by mouth 3 (three) times daily. Take 3 capsules by mouth three times daily.  90 capsule  0   Family History  Problem Relation Age of Onset  . Heart disease Mother   . Cancer Father 42    colon cancer   . Kidney disease Brother     2 of nine brothers on dialysis    . Hypertension Sister    History   Social History  . Marital Status: Single    Spouse Name: N/A    Number of Children: N/A  . Years of Education: N/A   Social History Main Topics  . Smoking status: Former Smoker -- 0.5 packs/day for 30 years    Types: Cigarettes    Quit date: 11/05/2010  . Smokeless tobacco: None  . Alcohol Use: 1.2 oz/week    2 Cans of beer per week  . Drug Use: No  . Sexually Active: None   Other Topics Concern  . None   Social History Narrative  . None    Review of Systems: Constitutional: Denies fever, chills, diaphoresis, appetite change and fatigue.  HEENT: Denies photophobia, eye pain, redness, hearing loss, ear pain, congestion, sore throat, rhinorrhea, sneezing, mouth sores, trouble swallowing, neck pain, neck stiffness and tinnitus.  Respiratory: Denies SOB, DOE, cough, chest tightness, and wheezing.  Cardiovascular: Denies chest pain, palpitations and leg swelling.  Gastrointestinal: Denies nausea, vomiting, abdominal pain, diarrhea, constipation, blood in stool and abdominal distention.  Genitourinary: Denies dysuria, urgency, frequency,  hematuria, flank pain and difficulty urinating.  Musculoskeletal: Denies myalgias, back pain, joint swelling, arthralgias and gait problem.  Skin: Denies pallor, rash and wound.  Neurological: Denies dizziness, seizures, syncope, weakness, light-headedness, numbness and headaches.  Hematological: Denies adenopathy. Easy bruising, personal or family bleeding history  Psychiatric/Behavioral: Denies suicidal ideation, mood changes, confusion, nervousness, sleep disturbance and agitation   Vitals: reviewed General: alert, well-developed, and cooperative to examination.  Head: normocephalic and atraumatic.  Eyes: vision grossly intact, pupils equal, pupils round, pupils reactive to light, no injection and anicteric.  Mouth: pharynx pink and moist, no erythema, and no exudates.  Neck: supple, full ROM, no  thyromegaly, no JVD, and no carotid bruits.  Lungs: normal respiratory effort, no accessory muscle use, normal breath sounds, no crackles, and no wheezes. Heart: normal rate, regular rhythm, no murmur, no gallop, and no rub.  Abdomen: soft, non-tender, normal bowel sounds, no distention, no guarding, no rebound tenderness, no hepatomegaly, and no splenomegaly.  Msk: no joint swelling, no joint warmth, and no redness over joints.  Pulses: 2+ DP/PT pulses bilaterally Extremities: No cyanosis, clubbing, edema Neurologic: alert & oriented X3, cranial nerves II-XII intact, strength normal in all extremities, sensation intact to light touch, and gait normal.  Skin: turgor normal and no rashes.  Psych: Oriented X3, memory intact for recent and remote, normally interactive, good eye contact, not anxious appearing, and not depressed appearing.    Assessment & Plan:  1. 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 -start Lodine PRN with meals -LB exercises -handout given -patient refused physical therapy

## 2011-04-18 ENCOUNTER — Telehealth: Payer: Self-pay | Admitting: *Deleted

## 2011-04-18 DIAGNOSIS — M545 Low back pain: Secondary | ICD-10-CM

## 2011-04-18 LAB — DRUGS OF ABUSE SCREEN W/O ALC, ROUTINE URINE
Benzodiazepines.: NEGATIVE
Marijuana Metabolite: NEGATIVE
Methadone: NEGATIVE
Propoxyphene: NEGATIVE

## 2011-04-18 NOTE — Progress Notes (Signed)
Addended by: Denna Haggard on: 04/18/2011 01:55 PM   Modules accepted: Orders

## 2011-04-18 NOTE — Telephone Encounter (Signed)
Received call from pt stating he wants to have a repeat Physical Therapy course, to help with flexibility. This was talked about at last visit. Pt # [phone] or leave message at [facility] [phone]. If you want to order this please have your nurse schedule.

## 2011-04-19 NOTE — Telephone Encounter (Signed)
I placed on order for a PT referral. Thank you.

## 2011-04-20 LAB — OPIATE, QUANTITATIVE, URINE
Hydrocodone: NEGATIVE NG/ML
Hydromorphone - Total: NEGATIVE NG/ML

## 2011-04-24 ENCOUNTER — Telehealth: Payer: Self-pay | Admitting: *Deleted

## 2011-04-24 DIAGNOSIS — I10 Essential (primary) hypertension: Secondary | ICD-10-CM

## 2011-04-24 NOTE — Telephone Encounter (Signed)
Pt would like appointment to see a "Foot Doctor" They called [facility] and would like  a referral.  I called pt back to get more info, no answer - message left

## 2011-04-24 NOTE — Telephone Encounter (Signed)
Another call from "Male caller" asking for a referral to Dr Mitzi Davenport, an eye doctor. She scheduled an appointemnt for 11/15 at 2:30 with the doctor, but if you refer medicare  Will pay for the visit.  Pt # [id number] or [id number]

## 2011-05-08 ENCOUNTER — Telehealth: Payer: Self-pay | Admitting: *Deleted

## 2011-05-08 NOTE — Telephone Encounter (Signed)
Woman calls and leaves message pt wants a referral for podiatry, please send to your nurse if approved

## 2011-05-10 NOTE — Telephone Encounter (Signed)
I am not sure why he needs an eye exam or to see a podiatrist. Please have him see me in the clinic and I will evaluate his need for the referrals.  Thank you

## 2011-05-11 NOTE — Telephone Encounter (Signed)
Unable to reach pt. Message left with instructions on pt's answer machine.

## 2011-05-26 ENCOUNTER — Ambulatory Visit: Payer: Medicare Other | Attending: Internal Medicine

## 2011-05-26 DIAGNOSIS — M2560 Stiffness of unspecified joint, not elsewhere classified: Secondary | ICD-10-CM | POA: Insufficient documentation

## 2011-05-26 DIAGNOSIS — IMO0001 Reserved for inherently not codable concepts without codable children: Secondary | ICD-10-CM | POA: Insufficient documentation

## 2011-05-26 DIAGNOSIS — M2550 Pain in unspecified joint: Secondary | ICD-10-CM | POA: Insufficient documentation

## 2011-05-26 DIAGNOSIS — R5381 Other malaise: Secondary | ICD-10-CM | POA: Insufficient documentation

## 2011-05-26 DIAGNOSIS — R262 Difficulty in walking, not elsewhere classified: Secondary | ICD-10-CM | POA: Insufficient documentation

## 2011-06-05 ENCOUNTER — Ambulatory Visit: Payer: Medicare Other | Attending: Internal Medicine | Admitting: Rehabilitation

## 2011-06-05 DIAGNOSIS — IMO0001 Reserved for inherently not codable concepts without codable children: Secondary | ICD-10-CM | POA: Insufficient documentation

## 2011-06-05 DIAGNOSIS — R262 Difficulty in walking, not elsewhere classified: Secondary | ICD-10-CM | POA: Insufficient documentation

## 2011-06-05 DIAGNOSIS — M2550 Pain in unspecified joint: Secondary | ICD-10-CM | POA: Insufficient documentation

## 2011-06-05 DIAGNOSIS — M2560 Stiffness of unspecified joint, not elsewhere classified: Secondary | ICD-10-CM | POA: Insufficient documentation

## 2011-06-05 DIAGNOSIS — R5381 Other malaise: Secondary | ICD-10-CM | POA: Insufficient documentation

## 2011-06-07 ENCOUNTER — Ambulatory Visit: Payer: Medicare Other | Admitting: Rehabilitation

## 2011-06-08 ENCOUNTER — Ambulatory Visit (INDEPENDENT_AMBULATORY_CARE_PROVIDER_SITE_OTHER): Payer: Medicare Other | Admitting: Internal Medicine

## 2011-06-08 ENCOUNTER — Encounter: Payer: Self-pay | Admitting: Internal Medicine

## 2011-06-08 DIAGNOSIS — L84 Corns and callosities: Secondary | ICD-10-CM | POA: Insufficient documentation

## 2011-06-08 DIAGNOSIS — M48061 Spinal stenosis, lumbar region without neurogenic claudication: Secondary | ICD-10-CM

## 2011-06-08 DIAGNOSIS — M25559 Pain in unspecified hip: Secondary | ICD-10-CM

## 2011-06-08 DIAGNOSIS — G5790 Unspecified mononeuropathy of unspecified lower limb: Secondary | ICD-10-CM

## 2011-06-08 DIAGNOSIS — I10 Essential (primary) hypertension: Secondary | ICD-10-CM

## 2011-06-08 MED ORDER — AMITRIPTYLINE HCL 25 MG PO TABS: 25.0000 mg | ORAL_TABLET | Freq: Every day | ORAL | Status: DC

## 2011-06-08 NOTE — Assessment & Plan Note (Signed)
Mr. Sweda states she's continued to have burning electric pain in his bilateral lower legs. This is likely secondary to spinal stenosis. Given that this pain is primarily neuropathic in character we will prescribe him with amitriptyline to be taken at night. This will help him sleep and hopefully assist further with his neuropathic pain. I will followup with him in a couple months and we will increase his gabapentin if amitriptyline proves ineffective.

## 2011-06-08 NOTE — Assessment & Plan Note (Signed)
[name] has corns or warts on the bottom of his feet that make it painful and difficult for him to ambulate. I provided a referral today for him to see a podiatrist to have these pared down.

## 2011-06-08 NOTE — Assessment & Plan Note (Signed)
[name] blood pressure is well-controlled today with pressure of 116/66. He does not know which medications he is taking. He states he takes 2 for blood pressure, but we have 3 listed in Epic that he is supposed to be taking. I asked [name] to bring in all his medications to his next visit so that we can determine what is exactly taking and discontinue the prescription for which he is not. In the meantime, I suggested that he continue his current regimen as it is working. He is to watch out for episodes of low blood pressure such as feeling dizzy, lightheaded, weak or diaphoretic.

## 2011-06-08 NOTE — Assessment & Plan Note (Signed)
I advised Mr. Memmer to try amitriptyline. He is to continue physical therapy as well as his other pain control medications.

## 2011-06-08 NOTE — Progress Notes (Signed)
Subjective:   Patient ID: Shawn PRABHAKAR male   DOB: 1945/07/29 65 y.o.   MRN: [id number]  HPI: [name] is a 65 y.o. man with past medical history significant for hypertension, GERD and musculoskeletal pain but who has had many inappropriately negative UDS and no longer recieves pain medications from our clinic, who presents today for evaluation of his chronic leg pain.  [name] states that he's continued to have pain in his legs all the way up to his distal thigh without radiation. The character of the pain is turning in severity is severe. This has been going on for very long time. He has had pain mainly during the day that is off and on. He can determine no alleviating  Factors.  He states the only aggravating factor he can identify is activity. He also notes a sharp pain in the bottom of his feet where he has corns or warts. These are worse with walking and pressure. He currently uses Tylenol, gabapentin, and etodolac to treat his pain. He states this works only moderately. He is able to walk with his cane and has a walker at home she needs it. 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 He states he would like additional help with ADLs from his aid.  Denies any weakness, numbness, wound, fevers, chills night sweats, chest pain or shortness of breath, nausea, vomiting or other complaint.       Past Medical History  Diagnosis Date  . Hypertension   . Back pain, chronic     MRI 2002, spinal stenosis mild to moderate L2-3 and L3-4, advanced degernerative changes MRI 2010  . Aortic stenosis, mild     1.53 sqcm 9/07, now 2.73 (11/2009) with moderate regurgitation  . Enlarged LA (left atrium)     mild dilatation per echo  . Chest pain, unspecified     neg cath 2003, neg myoview 2008   Current Outpatient Prescriptions  Medication Sig Dispense Refill  . albuterol (VENTOLIN HFA) 108 (90 BASE) MCG/ACT inhaler Inhale into the lungs. Take 1-2 puffs every 4-6 hours as  needed.       [name] amLODipine (NORVASC) 10 MG tablet Take 1 tablet (10 mg total) by mouth daily.  30 tablet  6  . aspirin 81 MG EC tablet Take 81 mg by mouth daily.        [name] etodolac (LODINE) 300 MG capsule Take 1 capsule (300 mg total) by mouth every 8 (eight) hours.  90 capsule  5  . gabapentin (NEURONTIN) 300 MG capsule Take 600 mg by mouth 3 (three) times daily.        . hydrochlorothiazide 25 MG tablet Take 1 tablet (25 mg total) by mouth daily.  30 tablet  6  . lisinopril (PRINIVIL,ZESTRIL) 40 MG tablet Take 1 tablet (40 mg total) by mouth daily.  30 tablet  6  . nitroGLYCERIN (NITROGLYCERIN) 2.5 MG CR capsule Take 2.5 mg by mouth as needed.        [name] omeprazole (PRILOSEC) 40 MG capsule Take 1 capsule (40 mg total) by mouth daily.  30 capsule  11   Family History  Problem Relation Age of Onset  . Heart disease Mother   . Cancer Father 13    colon cancer   . Kidney disease Brother     2 of nine brothers on dialysis  . Hypertension Sister    History   Social History  . Marital Status: Single  Spouse Name: N/A    Number of Children: N/A  . Years of Education: N/A   Social History Main Topics  . Smoking status: Former Smoker -- 0.5 packs/day for 30 years    Types: Cigarettes    Quit date: 11/05/2010  . Smokeless tobacco: Not on file  . Alcohol Use: 1.2 oz/week    2 Cans of beer per week  . Drug Use: No  . Sexually Active: Not on file   Other Topics Concern  . Not on file   Social History Narrative  . No narrative on file   Review of Systems: Per history of present illness  Objective:  Physical Exam: There were no vitals filed for this visit. Constitutional: Vital signs reviewed.  Patient is a well-developed and well-nourished man in no acute distress and cooperative with exam. Alert and oriented x3.   Head: Normocephalic and atraumatic Eyes: PERRL, EOMI, conjunctivae normal, No scleral icterus.  Cardiovascular: RRR, S1 normal, S2 normal, no MRG, DP and PT pulses  symmetric and intact bilaterally Pulmonary/Chest: CTAB, no wheezes, rales, or rhonchi Abdominal: Soft. Non-tender, non-distended, bowel sounds are normal, no masses, organomegaly, or guarding present.  Musculoskeletal: No joint deformities, erythema, or stiffness, ROM full and no nontender. There is 1+ pitting edema in the bilateral lower extremities.  Neurological: A&O x3, Strenght is normal and symmetric bilaterally, cranial nerve II-XII are grossly intact, no focal motor deficit, sensory intact to light touch bilaterally.  Skin: Warm, dry and intact. No rash, cyanosis, or clubbing.  there is a 1 cm hyperkeratotic nodule on the lateral plantar surface of the left foot. There are scattered 0.5-1 cm hyperkeratotic nodules on the plantar surface of the right foot as well.  Psychiatric: Normal mood and affect. Speech is very difficult to comprehend however behavior is normal.   Assessment & Plan:

## 2011-06-08 NOTE — Patient Instructions (Signed)
  Go to your local pharmacy or the health department to get the shingles vaccine. It should only cost around $20.  Return to clinic to see Dr. Candy Sledge in 2 months Please bring all your medications to your next clinic appointment.    Spinal Stenosis One cause of back pain is spinal stenosis. Stenosis means abnormal narrowing. The spinal canal contains and protects the spinal nerve roots. In spinal stenosis, the spinal canal narrows and pinches the spinal cord and nerves. This causes low back pain and pain in the legs. Stenosis may pinch the nerves that control muscles and sensation in the legs. This leads to pain and abnormal feelings in the leg muscles and areas supplied by those nerves. CAUSES  Spinal stenosis often happens to people as they get older and arthritic boney growths occur in their spinal canal. There is also a loss of the disk height between the bones of the back, which also adds to this problem. Sometimes the problem is present at birth. SYMPTOMS   Pain that is generally worse with activities, particularly standing and walking.   Numbness, tingling, hot or cold feelings, weakness, or a weariness in the legs.   Clumsiness, frequent falling, and a foot-slapping gait, which may come as a result of nerve pressure and muscle weakness.  DIAGNOSIS   Your caregiver may suspect spinal stenosis if you have unusual leg symptoms, such as those previously mentioned.   Your orthopedic surgeon may request special imaging exams, such a computerized magnetic scan (MRI) or computerized X-ray scan (CT) to find out the cause of the problem.  TREATMENT   Sometimes treatments such as postural changes or nonsteroidal anti-inflammatory drugs will relieve the pain.   Nonsteroidal anti-inflammatory medications may help relieve symptoms. These medicines do this by decreasing swelling and inflammation in the nerves.   When stenosis causes severe nerve root compression, conservative treatment may not be  enough to maintain a normal lifestyle. Surgery may be recommended to relieve the pressure on affected nerves. In properly selected patients, the results are very good, and patients are able to continue a normal lifestyle.  HOME CARE INSTRUCTIONS   Flexing the spine by leaning forward while walking may relieve symptoms. Lying with the knees drawn up to the chest may offer some relief. These positions enlarge the space available to the nerves. They may make it easier for stenosis sufferers to walk longer distances.   Rest, followed by gradually resuming activity, also can help.   Aerobic activity, such as bicycling or swimming, is often recommended.   Losing weight can also relieve some of the load on the spine.   Application of warm or cold compresses to the area of pain can be helpful.  SEEK MEDICAL CARE IF:   The periods of relief between episodes of pain become shorter and shorter.   You experience pain that radiates down your leg, even when you are not standing or walking.  SEEK IMMEDIATE MEDICAL CARE IF:   You have a loss of bowel or bladder control.   You have a sudden loss of feeling in your legs.   You suddenly cannot move your legs.  Document Released: 09/02/2003 Document Revised: 02/22/2011 Document Reviewed: 10/28/2009 [facility] Patient Information 2012 [place], [place].

## 2011-06-08 NOTE — Assessment & Plan Note (Signed)
[name] would like his home health aide to spend more hours assisting him as his mobility limited secondary to his hip pain. We will put in a referral to social work for him to be evaluated for increased assistance.

## 2011-06-09 NOTE — Progress Notes (Signed)
agree

## 2011-06-12 ENCOUNTER — Ambulatory Visit: Payer: Medicare Other

## 2011-06-12 ENCOUNTER — Telehealth: Payer: Self-pay | Admitting: Licensed Clinical Social Worker

## 2011-06-12 NOTE — Telephone Encounter (Signed)
Faxed request for more hours to [facility] due to change in medical status--increased pain and difficulties w/ walking and ADL's .

## 2011-07-18 DIAGNOSIS — L84 Corns and callosities: Secondary | ICD-10-CM | POA: Diagnosis not present

## 2011-07-18 DIAGNOSIS — B351 Tinea unguium: Secondary | ICD-10-CM | POA: Diagnosis not present

## 2011-07-18 DIAGNOSIS — M79609 Pain in unspecified limb: Secondary | ICD-10-CM | POA: Diagnosis not present

## 2011-07-31 ENCOUNTER — Encounter: Payer: Self-pay | Admitting: Internal Medicine

## 2011-07-31 ENCOUNTER — Ambulatory Visit (INDEPENDENT_AMBULATORY_CARE_PROVIDER_SITE_OTHER): Payer: Medicare Other | Admitting: Internal Medicine

## 2011-07-31 DIAGNOSIS — M545 Low back pain, unspecified: Secondary | ICD-10-CM

## 2011-07-31 DIAGNOSIS — I739 Peripheral vascular disease, unspecified: Secondary | ICD-10-CM

## 2011-07-31 DIAGNOSIS — I10 Essential (primary) hypertension: Secondary | ICD-10-CM | POA: Diagnosis not present

## 2011-07-31 DIAGNOSIS — F17200 Nicotine dependence, unspecified, uncomplicated: Secondary | ICD-10-CM | POA: Diagnosis not present

## 2011-07-31 DIAGNOSIS — G8929 Other chronic pain: Secondary | ICD-10-CM | POA: Diagnosis not present

## 2011-07-31 DIAGNOSIS — L84 Corns and callosities: Secondary | ICD-10-CM | POA: Diagnosis not present

## 2011-07-31 MED ORDER — HYDROCERIN EX CREA: 1.0000 "application " | TOPICAL_CREAM | Freq: Two times a day (BID) | CUTANEOUS | Status: DC

## 2011-07-31 MED ORDER — CAPSAICIN 0.075 % EX CREA: TOPICAL_CREAM | Freq: Three times a day (TID) | CUTANEOUS | Status: DC

## 2011-07-31 MED ORDER — LISINOPRIL-HYDROCHLOROTHIAZIDE 20-12.5 MG PO TABS: 2.0000 | ORAL_TABLET | Freq: Every day | ORAL | Status: DC

## 2011-07-31 NOTE — Patient Instructions (Signed)
Please begin taking lisinopril-hydrochlorothiazide combination pill. Take 2 tablets by mouth daily. He should only have to pay 1 co-pay.  Apply Eucerin cream to lower legs for dry itchy skin. Apply capsaicin cream to the stinging area of your legs twice daily. Go to your scheduled appointment for an ankle-brachial index to make sure that you do not have a blockage in one of your arteries in your legs. I strongly encourage you to take your aspirin regularly and discontinue smoking if possible.   Go to your local pharmacy or the health department to get the shingles vaccine. It should only cost around $20.  If any of your results are abnormal, we will contact you by phone or send you a letter. If they are normal, we will not contact you, but will be happy to discuss them at your next clinic appointment.  Return to clinic to see Dr. Candy Sledge in 2 months Please bring all your medications to your next clinic appointment.    Intermittent Claudication Blockage of leg arteries results from poor circulation of blood in the leg arteries. This produces an aching, tired, and sometimes burning pain in the legs that is brought on by exercise and made better by rest. Claudication refers to the limping that happens from leg cramps. It is also referred to as Vaso-occlusive disease of the legs, arterial insufficiency of the legs, recurrent leg pain, recurrent leg cramping and calf pain with exercise.  CAUSES  This condition is due to narrowing or blockage of the arteries (muscular vessels which carry blood away from the heart and around the body). Blockage of arteries can occur anywhere in the body. If they occur in the heart, a person may experience angina (chest pain) or even a heart attack. If they occur in the neck or the brain, a person may have a stroke. Intermittent claudication is when the blockage occurs in the legs, most commonly in the calf or the foot.   Atherosclerosis, or blockage of arteries, can occur  for many reasons. Some of these are smoking, diabetes, and high cholesterol.  SYMPTOMS  Intermittent claudication may occur in both legs, and it often continues to get worse over time. However, some people complain only of weakness in the legs when walking, or a feeling of "tiredness" in the buttocks. Impotence (not able to have an erection) is an occasional complaint in men. Pain while resting is uncommon.  WHAT TO EXPECT AT [facility] PROVIDER'S OFFICE: Your medical history will be asked for and a physical examination will be performed. Medical history questions documenting claudication in detail may include:   Time pattern   Do you have leg cramps at night (nocturnal cramps)?   How often does leg pain with cramping occur?   Is it getting worse?   What is the quality of the pain?   Is the pain sharp?   Is there an aching pain with the cramps?   Aggravating factors   Is it worse after you exercise?   Is it worse after you are standing for a while?   Do you smoke? How much?   Do you drink alcohol? How much?   Are you diabetic? How well is your blood sugar controlled?   Other   What other symptoms are also present?   Has there been impotence (men)?   Is there pain in the back?   Is there a darkening of the skin of the legs, feet or toes?   Is there weakness or paralysis  of the legs?  The physical examination may include evaluation of the femoral pulse (in the groin) and the other areas where the pulse can be felt in the legs. DIAGNOSIS  Diagnostic tests that may be performed include:  Blood pressure measured in arms and legs for comparison.   Doppler ultrasonography on the legs and the heart.   Duplex Doppler/ultrasound exam of extremity to visualize arterial blood flow.   ECG- to evaluate the activity of your heart.   Aortography- to visualize blockages in your arteries.  TREATMENT Surgical treatment may be suggested if claudication interferes with the  patient's activities or work, and if the diseased arteries do not seem to be improving after treatment. Be aware that this condition can worsen over time and you should carefully monitor your condition. HOME CARE INSTRUCTIONS  Talk to your caregiver about the cause of your leg cramping and about what to do at home to relieve it.   A healthy diet is important to lessen the likeliness of atherosclerosis.   A program of daily walking for short periods, and stopping for pain or cramping, may help improve function.   It is important to stop smoking.   Avoid putting hot or cold items on legs.   Avoid tight shoes.  SEEK MEDICAL CARE IF: There are many other causes of leg pain such as arthritis or low blood potassium. However, some causes of leg pain may be life threatening such as a blood clot in the legs. Seek medical attention if you have:  Leg pain that does not go away.   Legs that may be red, hot or swollen.   Ulcers or sores appear on your ankle or foot.   Any chest pain or shortness of breath accompanying leg pain.   Diabetes.   You are pregnant.  SEEK IMMEDIATE MEDICAL CARE IF:   Your leg pain becomes severe or will not go away.   Your foot turns blue or a dark color.   Your leg becomes red, hot or swollen or you develop a fever over 102F.   Any chest pain or shortness of breath accompanying leg pain.  MAKE SURE YOU:   Understand these instructions.   Will watch your condition.   Will get help right away if you are not doing well or get worse.  Document Released: 04/14/2004 Document Revised: 02/22/2011 Document Reviewed: 01/31/2008 [facility] Patient Information 2012 [place], [place].   Smoking Cessation This document explains the best ways for you to quit smoking and new treatments to help. It lists new medicines that can double or triple your chances of quitting and quitting for good. It also considers ways to avoid relapses and concerns you may have about quitting,  including weight gain. NICOTINE: A POWERFUL ADDICTION If you have tried to quit smoking, you know how hard it can be. It is hard because nicotine is a very addictive drug. For some people, it can be as addictive as heroin or cocaine. Usually, people make 2 or 3 tries, or more, before finally being able to quit. Each time you try to quit, you can learn about what helps and what hurts. Quitting takes hard work and a lot of effort, but you can quit smoking. QUITTING SMOKING IS ONE OF THE MOST IMPORTANT THINGS YOU WILL EVER DO.  You will live longer, feel better, and live better.   The impact on your body of quitting smoking is felt almost immediately:   Within 20 minutes, blood pressure decreases. Pulse returns to  its normal level.   After 8 hours, carbon monoxide levels in the blood return to normal. Oxygen level increases.   After 24 hours, chance of heart attack starts to decrease. Breath, hair, and body stop smelling like smoke.   After 48 hours, damaged nerve endings begin to recover. Sense of taste and smell improve.   After 72 hours, the body is virtually free of nicotine. Bronchial tubes relax and breathing becomes easier.   After 2 to 12 weeks, lungs can hold more air. Exercise becomes easier and circulation improves.   Quitting will reduce your risk of having a heart attack, stroke, cancer, or lung disease:   After 1 year, the risk of coronary heart disease is cut in half.   After 5 years, the risk of stroke falls to the same as a nonsmoker.   After 10 years, the risk of lung cancer is cut in half and the risk of other cancers decreases significantly.   After 15 years, the risk of coronary heart disease drops, usually to the level of a nonsmoker.   If you are pregnant, quitting smoking will improve your chances of having a healthy baby.   The people you live with, especially your children, will be healthier.   You will have extra money to spend on things other than  cigarettes.  FIVE KEYS TO QUITTING Studies have shown that these 5 steps will help you quit smoking and quit for good. You have the best chances of quitting if you use them together: 1. Get ready.  2. Get support and encouragement.  3. Learn new skills and behaviors.  4. Get medicine to reduce your nicotine addiction and use it correctly.  5. Be prepared for relapse or difficult situations. Be determined to continue trying to quit, even if you do not succeed at first.  1. GET READY  Set a quit date.   Change your environment.   Get rid of ALL cigarettes, ashtrays, matches, and lighters in your home, car, and place of work.   Do not let people smoke in your home.   Review your past attempts to quit. Think about what worked and what did not.   Once you quit, do not smoke. NOT EVEN A PUFF!  2. GET SUPPORT AND ENCOURAGEMENT Studies have shown that you have a better chance of being successful if you have help. You can get support in many ways.  Tell your family, friends, and coworkers that you are going to quit and need their support. Ask them not to smoke around you.   Talk to your caregivers (doctor, dentist, nurse, pharmacist, psychologist, and/or smoking counselor).   Get individual, group, or telephone counseling and support. The more counseling you have, the better your chances are of quitting. Programs are available at [employer] and health centers. Call your local health department for information about programs in your area.   Spiritual beliefs and practices may help some smokers quit.   Quit meters are Photographer that keep track of quit statistics, such as amount of "quit-time," cigarettes not smoked, and money saved.   Many smokers find one or more of the many self-help books available useful in helping them quit and stay off tobacco.  3. LEARN NEW SKILLS AND BEHAVIORS  Try to distract yourself from urges to smoke. Talk to someone,  go for a walk, or occupy your time with a task.   When you first try to quit, change your routine. Take  a different route to work. Drink tea instead of coffee. Eat breakfast in a different place.   Do something to reduce your stress. Take a hot bath, exercise, or read a book.   Plan something enjoyable to do every day. Reward yourself for not smoking.   Explore interactive web-based programs that specialize in helping you quit.  4. GET MEDICINE AND USE IT CORRECTLY Medicines can help you stop smoking and decrease the urge to smoke. Combining medicine with the above behavioral methods and support can quadruple your chances of successfully quitting smoking. The U.S. Food and Drug Administration (FDA) has approved 7 medicines to help you quit smoking. These medicines fall into 3 categories.  Nicotine replacement therapy (delivers nicotine to your body without the negative effects and risks of smoking):   Nicotine gum: Available over-the-counter.   Nicotine lozenges: Available over-the-counter.   Nicotine inhaler: Available by prescription.   Nicotine nasal spray: Available by prescription.   Nicotine skin patches (transdermal): Available by prescription and over-the-counter.   Antidepressant medicine (helps people abstain from smoking, but how this works is unknown):   Bupropion sustained-release (SR) tablets: Available by prescription.   Nicotinic receptor partial agonist (simulates the effect of nicotine in your brain):   Varenicline tartrate tablets: Available by prescription.   Ask your caregiver for advice about which medicines to use and how to use them. Carefully read the information on the package.   Everyone who is trying to quit may benefit from using a medicine. If you are pregnant or trying to become pregnant, nursing an infant, you are under age 32, or you smoke fewer than 10 cigarettes per day, talk to your caregiver before taking any nicotine replacement medicines.    You should stop using a nicotine replacement product and call your caregiver if you experience nausea, dizziness, weakness, vomiting, fast or irregular heartbeat, mouth problems with the lozenge or gum, or redness or swelling of the skin around the patch that does not go away.   Do not use any other product containing nicotine while using a nicotine replacement product.   Talk to your caregiver before using these products if you have diabetes, heart disease, asthma, stomach ulcers, you had a recent heart attack, you have high blood pressure that is not controlled with medicine, a history of irregular heartbeat, or you have been prescribed medicine to help you quit smoking.  5. BE PREPARED FOR RELAPSE OR DIFFICULT SITUATIONS  Most relapses occur within the first 3 months after quitting. Do not be discouraged if you start smoking again. Remember, most people try several times before they finally quit.   You may have symptoms of withdrawal because your body is used to nicotine. You may crave cigarettes, be irritable, feel very hungry, cough often, get headaches, or have difficulty concentrating.   The withdrawal symptoms are only temporary. They are strongest when you first quit, but they will go away within 10 to 14 days.  Here are some difficult situations to watch for:  Alcohol. Avoid drinking alcohol. Drinking lowers your chances of successfully quitting.   Caffeine. Try to reduce the amount of caffeine you consume. It also lowers your chances of successfully quitting.   Other smokers. Being around smoking can make you want to smoke. Avoid smokers.   Weight gain. Many smokers will gain weight when they quit, usually less than 10 pounds. Eat a healthy diet and stay active. Do not let weight gain distract you from your main goal, quitting smoking. Some medicines  that help you quit smoking may also help delay weight gain. You can always lose the weight gained after you quit.   Bad mood or  depression. There are a lot of ways to improve your mood other than smoking.  If you are having problems with any of these situations, talk to your caregiver. SPECIAL SITUATIONS AND CONDITIONS Studies suggest that everyone can quit smoking. Your situation or condition can give you a special reason to quit.  Pregnant women/new mothers: By quitting, you protect your baby's health and your own.   Hospitalized patients: By quitting, you reduce health problems and help healing.   Heart attack patients: By quitting, you reduce your risk of a second heart attack.   Lung, head, and neck cancer patients: By quitting, you reduce your chance of a second cancer.   Parents of children and adolescents: By quitting, you protect your children from illnesses caused by secondhand smoke.  QUESTIONS TO THINK ABOUT Think about the following questions before you try to stop smoking. You may want to talk about your answers with your caregiver.  Why do you want to quit?   If you tried to quit in the past, what helped and what did not?   What will be the most difficult situations for you after you quit? How will you plan to handle them?   Who can help you through the tough times? Your family? Friends? Caregiver?   What pleasures do you get from smoking? What ways can you still get pleasure if you quit?  Here are some questions to ask your caregiver:  How can you help me to be successful at quitting?   What medicine do you think would be best for me and how should I take it?   What should I do if I need more help?   What is smoking withdrawal like? How can I get information on withdrawal?  Quitting takes hard work and a lot of effort, but you can quit smoking. FOR MORE INFORMATION  Smokefree.gov (http://www.davis-sullivan.com/) provides free, accurate, evidence-based information and professional assistance to help support the immediate and long-term needs of people trying to quit smoking. Document Released:  06/06/2001 Document Revised: 02/22/2011 Document Reviewed: 03/29/2009 [facility] Patient Information 2012 [place], [place].

## 2011-07-31 NOTE — Progress Notes (Signed)
Pt aware of appt [facility] 08/09/11 1:30PM for ABI. Stanton Kidney Montzerrat Brunell RN 07/31/11 3:34PM

## 2011-08-02 DIAGNOSIS — H2510 Age-related nuclear cataract, unspecified eye: Secondary | ICD-10-CM | POA: Diagnosis not present

## 2011-08-02 DIAGNOSIS — H20019 Primary iridocyclitis, unspecified eye: Secondary | ICD-10-CM | POA: Diagnosis not present

## 2011-08-03 ENCOUNTER — Other Ambulatory Visit: Payer: Self-pay | Admitting: *Deleted

## 2011-08-03 DIAGNOSIS — M48061 Spinal stenosis, lumbar region without neurogenic claudication: Secondary | ICD-10-CM

## 2011-08-03 DIAGNOSIS — G5790 Unspecified mononeuropathy of unspecified lower limb: Secondary | ICD-10-CM

## 2011-08-03 MED ORDER — AMITRIPTYLINE HCL 25 MG PO TABS: 25.0000 mg | ORAL_TABLET | Freq: Every day | ORAL | Status: DC

## 2011-08-03 NOTE — Telephone Encounter (Signed)
Amitriptyline refilled- rx request faxed to [facility] pharmacy.

## 2011-08-04 NOTE — Assessment & Plan Note (Signed)
I am very happy that [name] has had excellent relief of his spinal stenosis back and hip pain. I will continue him on gabapentin and amitriptyline. Etodolac removed from med list as he does not take it.

## 2011-08-04 NOTE — Assessment & Plan Note (Signed)
Again recommended smoking cessation. He denied any pharmacologic assistance

## 2011-08-04 NOTE — Assessment & Plan Note (Signed)
Blood pressure is well controlled today. [name] brought in all his medications and he is currently taking 3 meds (amlodipine, HCTZ and lisinopril).  I will continue the current plan, but will prescribe a lisinopril-HCTZ combo pill to eliminate 1 co-pay.

## 2011-08-04 NOTE — Assessment & Plan Note (Addendum)
[name] has had continued symptoms that are consistent with claudication vs focal neuritis. He states that this pinching sensation is worse with walking and at night. However, he specifically denies any muscle cramping, which I would expect. Focal neuritits would be consistent with the small are that the pain localizes to and the character is more consistent. I will work up for claudication and if that is unsuccessful will consider further management of neuritis.   He was previously referred to have an ABI done, but never went. I will re-order and strongly recommended that he not miss this test. As far as further risk factor modification his LDL was 63 A1c is 5.9, neither of which are amenable to intervention. I recommended that he discontinue smoking and get as much exercise as he can. I also gave a prescription for capsicin cream to help with the biting pain he has (in case this is neuritis).   Lab Results  Component Value Date   CHOL 169 11/14/2010   HDL 91 11/14/2010   LDLCALC  Value: 63        Total Cholesterol/HDL:CHD Risk Coronary Heart Disease Risk Table                     Men   Women  1/2 Average Risk   3.4   3.3  Average Risk       5.0   4.4  2 X Average Risk   9.6   7.1  3 X Average Risk  23.4   11.0        Use the calculated Patient Ratio above and the CHD Risk Table to determine the patient's CHD Risk.        ATP III CLASSIFICATION (LDL):  <100     mg/dL   Optimal  161-096  mg/dL   Near or Above                    Optimal  130-159  mg/dL   Borderline  045-409  mg/dL   High  >811     mg/dL   Very High 03/09/7828   TRIG 75 11/14/2010   CHOLHDL 1.9 11/14/2010   Lab Results  Component Value Date   HGBA1C  Value: 5.9 (NOTE)                                                                       According to the ADA Clinical Practice Recommendations for 2011, when HbA1c is used as a screening test:   >=6.5%   Diagnostic of Diabetes Mellitus           (if abnormal result  is confirmed)  5.7-6.4%    Increased risk of developing Diabetes Mellitus  References:Diagnosis and Classification of Diabetes Mellitus,Diabetes Care,2011,34(Suppl 1):S62-S69 and Standards of Medical Care in         Diabetes - 2011,Diabetes Care,2011,34  (Suppl 1):S11-S61.* 11/14/2010

## 2011-08-04 NOTE — Assessment & Plan Note (Signed)
Resolved with much less pain s/p referral to podiatrist for paring down of his corns

## 2011-08-04 NOTE — Progress Notes (Signed)
Subjective:    Patient ID: [name], male    DOB: 10/06/1945, 66 y.o.   MRN: [id number] [name] is a 66 y.o. man with past medical history significant for hypertension, GERD and musculoskeletal pain, who presents today with leg pain.  Leg Pain  Incident onset: Leg pain has been present for past couple of years. He states this pain is different than the pain in his bilateral legs an thighs for which he was here last (now resolved). Frequency is "off and on" and is increasingly frequent. There was no injury mechanism. The pain is present in the left leg (a small patch on the lateral calf). The quality of the pain is described as stabbing (At times, the pain feels either like a "biting" or kneedlle poking sensation. He tries to scratch the area, but this doesn't help relieve the feeling much. He specifically denies leg cramping). The pain is moderate. The pain has been worsening since onset. Pertinent negatives include no inability to bear weight, loss of motion, loss of sensation, muscle weakness, numbness or tingling. He reports no foreign bodies present. The symptoms are aggravated by movement (He states the pain is worse with walking and sometimes at night., though he is able to sleep witout difficulty). He has tried acetaminophen and NSAIDs (Also, gabapentin and tricyclic antidepressant have not helped.) for the symptoms. The treatment provided no relief.  Back Pain This is a chronic problem. The current episode started more than 1 year ago. The problem occurs rarely. The problem has been resolved since onset. The pain is present in the lumbar spine. The patient is experiencing no pain. Associated symptoms include leg pain. Pertinent negatives include no abdominal pain, bladder incontinence, bowel incontinence, chest pain, dysuria, fever, headaches, numbness, perianal numbness, tingling or weakness. Risk factors include lack of exercise and sedentary lifestyle. He has tried analgesics  and home exercises for the symptoms. The treatment provided significant ([name] reports that with addition of amitriptyline to his pain medications, his pain has resolved and he can sleep at night. He states he is not taking etodolac) relief.    [name] continues to smoke. He did bring in his medications for me to review today.    Review of Systems  Constitutional: Negative for fever, chills, activity change and fatigue.  Respiratory: Negative for cough and shortness of breath.   Cardiovascular: Negative for chest pain, palpitations and leg swelling.  Gastrointestinal: Negative for nausea, abdominal pain, diarrhea, constipation and bowel incontinence.  Genitourinary: Negative for bladder incontinence, dysuria and difficulty urinating.  Musculoskeletal: Positive for gait problem. Negative for myalgias, back pain, joint swelling and arthralgias.  Skin: Positive for color change and rash. Negative for pallor and wound.  Neurological: Negative for dizziness, tingling, syncope, weakness, numbness and headaches.       Objective:   Physical Exam  Vitals reviewed. Constitutional: He is oriented to person, place, and time. He appears well-developed and well-nourished. No distress.  HENT:  Head: Normocephalic and atraumatic.  Mouth/Throat: Oropharynx is clear and moist.  Eyes: Conjunctivae are normal.  Cardiovascular: Normal rate, regular rhythm, normal heart sounds and intact distal pulses.  Exam reveals no gallop and no friction rub.   No murmur heard. Pulmonary/Chest: Effort normal and breath sounds normal. No respiratory distress. He has no wheezes. He has no rales. He exhibits no tenderness.  Abdominal: He exhibits no distension. There is no tenderness.  Musculoskeletal: He exhibits no edema and no tenderness.  Thoracic back: He exhibits no tenderness, no bony tenderness, no pain and no spasm.       Lumbar back: He exhibits no tenderness, no bony tenderness, no pain and no  spasm.  Neurological: He is alert and oriented to person, place, and time. No cranial nerve deficit. He exhibits normal muscle tone.  Skin: Skin is warm and dry. Lesion and rash noted. No petechiae and no purpura noted. Rash is not macular, not papular, not maculopapular, not nodular, not pustular, not vesicular and not urticarial. He is not diaphoretic. No cyanosis or erythema. No pallor. Nails show no clubbing.          xirosis on bilateral lower legs   Psychiatric: He has a normal mood and affect. His behavior is normal.          Assessment & Plan:

## 2011-08-07 NOTE — Progress Notes (Signed)
agree

## 2011-08-09 ENCOUNTER — Ambulatory Visit (HOSPITAL_COMMUNITY)
Admission: RE | Admit: 2011-08-09 | Discharge: 2011-08-09 | Disposition: A | Discharge status: Home / Self Care | Payer: Medicare Other | Source: Ambulatory Visit | Attending: Internal Medicine | Admitting: Internal Medicine

## 2011-08-09 DIAGNOSIS — I739 Peripheral vascular disease, unspecified: Secondary | ICD-10-CM

## 2011-08-09 NOTE — Progress Notes (Signed)
*  PRELIMINARY RESULTS* Vascular Ultrasound PRELIMINARY  PRELIMINARY  PRELIMINARY  PRELIMINARY  ABI completed: Normal ABI study.   RIGHT    LEFT    PRESSURE WAVEFORM  PRESSURE WAVEFORM  BRACHIAL 127 Tri BRACHIAL 113 Tri  DP   DP    PT 132 Bi PT 132 Bi  AT 143 Bi AT 142 Bi  PER   PER    GREAT TOE   GREAT TOE      RIGHT LEFT  ABI 1.13 1.12     Farrel Demark ,RDMS 08/09/2011, 2:04 PM

## 2011-08-10 ENCOUNTER — Encounter: Payer: Medicare Other | Admitting: Internal Medicine

## 2011-08-31 ENCOUNTER — Other Ambulatory Visit: Payer: Self-pay | Admitting: *Deleted

## 2011-08-31 MED ORDER — GABAPENTIN 300 MG PO CAPS: 600.0000 mg | ORAL_CAPSULE | Freq: Three times a day (TID) | ORAL | Status: DC

## 2011-09-01 ENCOUNTER — Other Ambulatory Visit: Payer: Self-pay | Admitting: *Deleted

## 2011-09-01 DIAGNOSIS — I10 Essential (primary) hypertension: Secondary | ICD-10-CM

## 2011-09-01 MED ORDER — AMLODIPINE BESYLATE 10 MG PO TABS: 10.0000 mg | ORAL_TABLET | Freq: Every day | ORAL | Status: DC

## 2011-09-01 NOTE — Telephone Encounter (Signed)
Empty encounter

## 2011-09-21 ENCOUNTER — Encounter: Payer: Self-pay | Admitting: Internal Medicine

## 2011-09-21 ENCOUNTER — Ambulatory Visit (INDEPENDENT_AMBULATORY_CARE_PROVIDER_SITE_OTHER): Payer: Medicare Other | Admitting: Internal Medicine

## 2011-09-21 VITALS — BP 120/69 | HR 88 | Temp 98.1°F | Ht 74.0 in | Wt 302.0 lb

## 2011-09-21 DIAGNOSIS — L738 Other specified follicular disorders: Secondary | ICD-10-CM | POA: Diagnosis not present

## 2011-09-21 DIAGNOSIS — G5790 Unspecified mononeuropathy of unspecified lower limb: Secondary | ICD-10-CM

## 2011-09-21 DIAGNOSIS — M48061 Spinal stenosis, lumbar region without neurogenic claudication: Secondary | ICD-10-CM

## 2011-09-21 DIAGNOSIS — G589 Mononeuropathy, unspecified: Secondary | ICD-10-CM

## 2011-09-21 DIAGNOSIS — L853 Xerosis cutis: Secondary | ICD-10-CM

## 2011-09-21 DIAGNOSIS — I739 Peripheral vascular disease, unspecified: Secondary | ICD-10-CM | POA: Diagnosis not present

## 2011-09-21 DIAGNOSIS — G609 Hereditary and idiopathic neuropathy, unspecified: Secondary | ICD-10-CM | POA: Diagnosis not present

## 2011-09-21 DIAGNOSIS — G629 Polyneuropathy, unspecified: Secondary | ICD-10-CM

## 2011-09-21 DIAGNOSIS — L308 Other specified dermatitis: Secondary | ICD-10-CM

## 2011-09-21 HISTORY — DX: Other specified dermatitis: L30.8

## 2011-09-21 MED ORDER — AMITRIPTYLINE HCL 25 MG PO TABS: 25.0000 mg | ORAL_TABLET | Freq: Every morning | ORAL | Status: DC

## 2011-09-21 NOTE — Assessment & Plan Note (Addendum)
[name] is likely suffering from claudication of neurologic origin. His ABI was normal after his previous visit which makes me think that this is due to arterial claudication. Also he has no history of DVT that would lead me to believe this is secondary to venous claudication. Also he does have some neuropathy and decreased sensation in the feet, as well as a history of low back pain. This leads me to think that his pain is likely secondary to neurologic factors. I will order nerve conduction studies as well as a B12 level to evaluate for neuropathy. I will recommend that he begin taking his amlodipine in the morning for better symptom control during the day. If these are normal I will consider d-dimer or lower extremity vascular duplex to evaluate for venous claudication.

## 2011-09-21 NOTE — Patient Instructions (Addendum)
Please take amitriptyline in the morning. This medication can make you drowsy so please do not drive or operate machinery if you're feeling drowsy. This effect should get better with time.  We will order nerve conduction studies which test your nerve function. If any of your lab results are abnormal we will contact you by phone or send you a letter. If they are normal, we will not contact you, but will be happy to discuss them at your next clinic appointment.  If you have not yet had the nerve conduction test before your next appointment, please cancel the appointment and reschedule for after you had the nerve conduction test.  The proper technique for using your inhaler is as follows: Take a deep breath in and exhale completely. With you next deep breath in, activate your inhaler and inhale the medication as deeply as you can. Hold this breath in for 7 full seconds. This counts as one puff.   Return to clinic to see Dr. Candy Sledge in 1 month or after nerve conduction studies. Please bring all your medications to your next clinic appointment.   Electroneurography Test This test measures the conduction of the nerves throughout the body. It is very similar to the way electricity travels throughout your house. When a stimulus is activated at one site, this test determines how long it takes to travel to another location such as making a muscle contract. It would be similar to turning on a light switch and measuring the time it takes for the light bulb to come on. This test is called electromyoneurography and studies the nerves. PREPARATION FOR TEST No preparation or fasting is necessary. NORMAL FINDINGS No evidence of peripheral nerve injury or disease (conduction velocity is usually lower in the elderly). Ranges for normal findings may vary among different laboratories and hospitals. You should always check with your doctor after having lab work or other tests done to discuss the meaning of your test  results and whether your values are considered within normal limits. MEANING OF TEST  Your caregiver will go over the test results with you and discuss the importance and meaning of your results, as well as treatment options and the need for additional tests if necessary. OBTAINING THE TEST RESULTS It is your responsibility to obtain your test results. Ask the lab or department performing the test when and how you will get your results. Document Released: 10/13/2004 Document Revised: 06/01/2011 Document Reviewed: 05/22/2008 [facility] Patient Information 2012 [place], [place].

## 2011-09-21 NOTE — Progress Notes (Signed)
Subjective:   Patient ID: [name] male   DOB: [birth year] 66 y.o.   MRN: [id number]  HPI: [name] is a 66 y.o. man with past medical history significant for low sugar decortication, back pain with a question of spinal stenosis, neuropathy lower extremities and bilateral leg edema. He presents to clinic today for evaluation of his claudication.  [name] states that his lower extremity claudication is continuing to bother him. The pain has been present for approximately 2 years. He states he only has pain while walking. The pain begins shortly after walking it is sharp in his bilateral thighs. He states it is both on his medial and lateral thighs. He also notes pain in his left lateral calf, which she describes as "biting". The pain gradually subsides with rest. The pain is described as moderate. Her and negatives include lack of muscle weakness, inability to bear weight, loss of motion, loss of sensation, numbness, or tingling. Associated symptoms include daily lower extremity swelling that is worse at the end of the day. He states his mobility is limited only to pain with ambulation secondary to his claudication. [name] states that he does not have pain if he simply stands straight up, if he is sitting, or if he is laying down. He states the course is stable without progression or improvement. He states that he is taken gabapentin and amitriptyline for the pain. He states the gabapentin is provided him no relief. He states he takes the amitriptyline at bedtime when he has no pain anyways. He is also tried nonsteroidal anti-inflammatories with minimal relief.    Past Medical History  Diagnosis Date  . Hypertension   . Back pain, chronic     MRI 2002, spinal stenosis mild to moderate L2-3 and L3-4, advanced degernerative changes MRI 2010  . Aortic stenosis, mild     1.53 sqcm 9/07, now 2.73 (11/2009) with moderate regurgitation  . Enlarged LA (left atrium)     mild  dilatation per echo  . Chest pain, unspecified     neg cath 2003, neg myoview 2008   Current Outpatient Prescriptions  Medication Sig Dispense Refill  . amitriptyline (ELAVIL) 25 MG tablet Take 1 tablet (25 mg total) by mouth every morning.  30 tablet  4  . amLODipine (NORVASC) 10 MG tablet Take 1 tablet (10 mg total) by mouth daily.  30 tablet  11  . aspirin 81 MG EC tablet Take 81 mg by mouth daily.        . capsicum (ZOSTRIX) 0.075 % topical cream Apply topically 3 (three) times daily. Apply to left calf pain. Be sure to wash your hands after applying this to her skin.  28.3 g  0  . gabapentin (NEURONTIN) 300 MG capsule Take 2 capsules (600 mg total) by mouth 3 (three) times daily.  180 capsule  1  . lisinopril-hydrochlorothiazide (PRINZIDE,ZESTORETIC) 20-12.5 MG per tablet Take 2 tablets by mouth daily.  60 tablet  11  . omeprazole (PRILOSEC) 40 MG capsule Take 1 capsule (40 mg total) by mouth daily.  30 capsule  11  . DISCONTD: amitriptyline (ELAVIL) 25 MG tablet Take 1 tablet (25 mg total) by mouth at bedtime.  30 tablet  4  . albuterol (VENTOLIN HFA) 108 (90 BASE) MCG/ACT inhaler Inhale into the lungs. Take 1-2 puffs every 4-6 hours as needed.       . hydrocerin (EUCERIN) CREA Apply 1 application topically 2 (two) times daily.  454 g  2  .  nitroGLYCERIN (NITROGLYCERIN) 2.5 MG CR capsule Take 2.5 mg by mouth as needed.        [name] DISCONTD: gabapentin (NEURONTIN) 300 MG capsule Take 2 capsules (600 mg total) by mouth 3 (three) times daily.  90 capsule  2  . DISCONTD: gabapentin (NEURONTIN) 300 MG capsule Take 2 capsules (600 mg total) by mouth 3 (three) times daily. Take 3 capsules by mouth three times daily.  90 capsule  3   Family History  Problem Relation Age of Onset  . Heart disease Mother   . Cancer Father 66    colon cancer   . Kidney disease Brother     2 of nine brothers on dialysis  . Hypertension Sister    History   Social History  . Marital Status: Single    Spouse  Name: N/A    Number of Children: N/A  . Years of Education: N/A   Social History Main Topics  . Smoking status: Former Smoker -- 0.5 packs/day for 30 years    Types: Cigarettes    Quit date: 11/05/2010  . Smokeless tobacco: None  . Alcohol Use: 1.2 oz/week    2 Cans of beer per week  . Drug Use: No  . Sexually Active: None   Other Topics Concern  . None   Social History Narrative  . None   Review of Systems: Constitutional: Denies fever, chills, diaphoresis, appetite change and fatigue.  Respiratory: Denies SOB, DOE Cardiovascular: Denies chest pain, palpitations Musculoskeletal: Endorses myalgias, back pain, joint swelling, arthralgias and gait problem.  Skin: Endorses bilateral lower extremity dry skin and itching.  Neurological: Denies dizziness, seizures, syncope, weakness, light-headedness, numbness and headaches.   Objective:  Physical Exam: Filed Vitals:   09/21/11 1319  BP: 120/69  Pulse: 88  Temp: 98.1 F (36.7 C)  TempSrc: Oral  Height: 6\' 2"  (1.88 m)  Weight: 302 lb (136.986 kg)   Constitutional: Vital signs reviewed.  Patient is a well-developed and well-nourished man in no acute distress and cooperative with exam. Alert and oriented x3.  Cardiovascular: 2+ dorsalis pedis pulses bilaterally. 1+ posterior tibial pulses bilaterally. Adequate perfusion of his extremities. Musculoskeletal: No joint deformities, erythema, or stiffness, ROM full and no nontender. Bilateral lower extremities have  1+ pending edema Neurological: A&O x3, Strenght is normal and symmetric bilaterally, patient ambulates with a cane. He does not walk stooped forward.  Skin: Warm. There are multiple excoriations of the bilateral lower extremities. The skin is broken in some places secondary to excoriations. There are multiple hyperpigmented patches on the lower extremities bilaterally consistent with venous stasis dermatitis. There is no hair on the legs or feet.    Assessment & Plan: see  problem-based charting

## 2011-09-21 NOTE — Progress Notes (Deleted)
Subjective:     Patient ID: [name], male   DOB: 1945/08/10, 66 y.o.   MRN: [id number]  Leg Pain  The incident occurred more than 1 week ago (no inciting event). There was no injury mechanism. The pain is present in the right thigh and left thigh. The quality of the pain is described as cramping and aching. The pain is moderate. The pain has been constant since onset. Associated symptoms include muscle weakness. Pertinent negatives include no inability to bear weight, loss of motion, loss of sensation, numbness or tingling. Associated symptoms comments: Swelling daily.[name] He reports no foreign bodies present. The symptoms are aggravated by movement. He has tried NSAIDs and rest for the symptoms. The treatment provided mild relief.     Review of Systems  Neurological: Negative for tingling and numbness.       Objective:   Physical Exam     Assessment:     ***    Plan:     ***

## 2011-09-22 LAB — VITAMIN B12: Vitamin B-12: 200 pg/mL — ABNORMAL LOW (ref 211–911)

## 2011-10-01 ENCOUNTER — Encounter: Payer: Self-pay | Admitting: Internal Medicine

## 2011-10-01 ENCOUNTER — Other Ambulatory Visit: Payer: Self-pay | Admitting: Internal Medicine

## 2011-10-01 DIAGNOSIS — E538 Deficiency of other specified B group vitamins: Secondary | ICD-10-CM

## 2011-10-01 HISTORY — DX: Deficiency of other specified B group vitamins: E53.8

## 2011-10-01 MED ORDER — CYANOCOBALAMIN 1000 MCG/ML IJ SOLN: 1000.0000 ug | Freq: Every day | INTRAMUSCULAR | Status: AC

## 2011-10-01 NOTE — Progress Notes (Signed)
After [name] visited me for his chronic lower extremity pain, we drew B12 level in the thought that this was secondary to neuritis. His B12 level was indeed low.  Diagnosis established March 2013  Requires the following lab tests: Homocystine, methylmalonic acid, intrinsic factor antibody, and RBC folate to determine cause of B12 deficiency. I would also like to see him in clinic, or in the [facility] clinic to determine other etiology such as pancreatic insufficiency or sprue that might have caused him to be deficient in vitamin B12.  Requires the following treatments: vitamin B12 injections daily x1 week, then weekly x1 month, then monthly until B12 level normalizes. After that, oral supplementation can be followed  Unfortunately the wonderful system that is [facility] will not let me order a homocystine level at this time. I will have this lab drawn at his next visit.

## 2011-10-09 ENCOUNTER — Telehealth: Payer: Self-pay | Admitting: *Deleted

## 2011-10-09 ENCOUNTER — Ambulatory Visit (INDEPENDENT_AMBULATORY_CARE_PROVIDER_SITE_OTHER): Payer: Medicare Other | Admitting: *Deleted

## 2011-10-09 DIAGNOSIS — E538 Deficiency of other specified B group vitamins: Secondary | ICD-10-CM | POA: Diagnosis not present

## 2011-10-09 MED ORDER — CYANOCOBALAMIN 1000 MCG/ML IJ SOLN
1000.0000 ug | Freq: Once | INTRAMUSCULAR | Status: AC
  Administered 2011-10-09: 1000 ug via INTRAMUSCULAR

## 2011-10-09 NOTE — Progress Notes (Signed)
Debra Ditzler RN, contacted Ms Arsenio Katz, [phone], at pt's facility at approx. 1:30P, to notify her of pt's appt for EMG on 10/19/2011 at 9A - at [facility]. Ms Arsenio Katz verbalized understanding of this appt info. Dorie Rank, RN, for [employer], RN, on 10/09/2011,charted at 5:57 P

## 2011-10-10 ENCOUNTER — Ambulatory Visit (INDEPENDENT_AMBULATORY_CARE_PROVIDER_SITE_OTHER): Payer: Medicare Other | Admitting: *Deleted

## 2011-10-10 DIAGNOSIS — E538 Deficiency of other specified B group vitamins: Secondary | ICD-10-CM | POA: Diagnosis not present

## 2011-10-10 MED ORDER — CYANOCOBALAMIN 1000 MCG/ML IJ SOLN
1000.0000 ug | Freq: Once | INTRAMUSCULAR | Status: AC
  Administered 2011-10-10: 1000 ug via INTRAMUSCULAR

## 2011-10-11 ENCOUNTER — Ambulatory Visit (INDEPENDENT_AMBULATORY_CARE_PROVIDER_SITE_OTHER): Payer: Medicare Other | Admitting: *Deleted

## 2011-10-11 DIAGNOSIS — E538 Deficiency of other specified B group vitamins: Secondary | ICD-10-CM | POA: Diagnosis not present

## 2011-10-11 MED ORDER — CYANOCOBALAMIN 1000 MCG/ML IJ SOLN
1000.0000 ug | Freq: Once | INTRAMUSCULAR | Status: AC
  Administered 2011-10-11: 1000 ug via INTRAMUSCULAR

## 2011-10-12 ENCOUNTER — Ambulatory Visit (INDEPENDENT_AMBULATORY_CARE_PROVIDER_SITE_OTHER): Payer: Medicare Other | Admitting: *Deleted

## 2011-10-12 DIAGNOSIS — E538 Deficiency of other specified B group vitamins: Secondary | ICD-10-CM

## 2011-10-12 MED ORDER — CYANOCOBALAMIN 1000 MCG/ML IJ SOLN
1000.0000 ug | Freq: Once | INTRAMUSCULAR | Status: AC
  Administered 2011-10-12 – 2011-10-13 (×2): 1000 ug via INTRAMUSCULAR

## 2011-10-13 ENCOUNTER — Ambulatory Visit (INDEPENDENT_AMBULATORY_CARE_PROVIDER_SITE_OTHER): Payer: Medicare Other | Admitting: *Deleted

## 2011-10-13 DIAGNOSIS — E538 Deficiency of other specified B group vitamins: Secondary | ICD-10-CM

## 2011-10-16 ENCOUNTER — Ambulatory Visit (INDEPENDENT_AMBULATORY_CARE_PROVIDER_SITE_OTHER): Payer: Medicare Other | Admitting: *Deleted

## 2011-10-16 DIAGNOSIS — E538 Deficiency of other specified B group vitamins: Secondary | ICD-10-CM

## 2011-10-16 MED ORDER — CYANOCOBALAMIN 1000 MCG/ML IJ SOLN
1000.0000 ug | Freq: Once | INTRAMUSCULAR | Status: AC
  Administered 2011-10-16: 1000 ug via INTRAMUSCULAR

## 2011-10-17 ENCOUNTER — Ambulatory Visit (INDEPENDENT_AMBULATORY_CARE_PROVIDER_SITE_OTHER): Payer: Medicare Other | Admitting: *Deleted

## 2011-10-17 DIAGNOSIS — E538 Deficiency of other specified B group vitamins: Secondary | ICD-10-CM | POA: Diagnosis not present

## 2011-10-17 MED ORDER — CYANOCOBALAMIN 1000 MCG/ML IJ SOLN
1000.0000 ug | Freq: Once | INTRAMUSCULAR | Status: AC
  Administered 2011-10-17: 1000 ug via INTRAMUSCULAR

## 2011-10-18 ENCOUNTER — Ambulatory Visit (INDEPENDENT_AMBULATORY_CARE_PROVIDER_SITE_OTHER): Payer: Medicare Other | Admitting: Internal Medicine

## 2011-10-18 ENCOUNTER — Encounter: Payer: Medicare Other | Admitting: Internal Medicine

## 2011-10-18 VITALS — BP 133/73 | HR 95 | Temp 98.6°F | Wt 298.4 lb

## 2011-10-18 DIAGNOSIS — G5790 Unspecified mononeuropathy of unspecified lower limb: Secondary | ICD-10-CM

## 2011-10-18 MED ORDER — DICLOFENAC SODIUM 1 % TD GEL: 1.0000 "application " | Freq: Four times a day (QID) | TRANSDERMAL | Status: DC

## 2011-10-18 NOTE — Telephone Encounter (Signed)
Opened in error

## 2011-10-18 NOTE — Assessment & Plan Note (Signed)
Improving with current medications. Continue as before. Add diclofenac gel for topical application for breakthrough pain if insurance covers this medication Follow up with PCP in 1-2 months

## 2011-10-18 NOTE — Progress Notes (Signed)
66Y/o man with pmh listed below comes for checkup Was seen a month ago for neuropathic pain in his leg and was started on amitriptyline in addition to neurontin and the B12 injections for B12 deficiency He comes to the clinic for follow up on this problem His symptoms have improved since this treatment. He has been compliant with that. He does have some pain in his right thigh for last 2-3 weeks with some irritation of the skin of the thigh  No other new complaints   Physical exam  General Appearance:     Filed Vitals:   10/18/11 1353  BP: 133/73  Pulse: 95  Temp: 98.6 F (37 C)  TempSrc: Oral  Weight: 298 lb 6.4 oz (135.353 kg)     Alert, cooperative, no distress, appears stated age  Head:    Normocephalic, without obvious abnormality, atraumatic  Eyes:    PERRL, conjunctiva/corneas clear, EOM's intact, fundi    benign, both eyes       Neck:   Supple, symmetrical, trachea midline, no adenopathy;       thyroid:  No enlargement/tenderness/nodules; no carotid   bruit or JVD  Lungs:     Clear to auscultation bilaterally, respirations unlabored  Chest wall:    No tenderness or deformity  Heart:    Regular rate and rhythm, S1 and S2 normal, no murmur, rub   or gallop  Abdomen:     Soft, non-tender, bowel sounds active all four quadrants,    no masses, no organomegaly  Extremities:   Extremities normal, atraumatic, no cyanosis or edema  Pulses:   2+ and symmetric all extremities  Skin:   Skin color, texture, turgor normal, no rashes or lesions  Neurologic:  nonfocal grossly    ROS  As per history of present illness

## 2011-10-19 DIAGNOSIS — G609 Hereditary and idiopathic neuropathy, unspecified: Secondary | ICD-10-CM | POA: Diagnosis not present

## 2011-10-19 DIAGNOSIS — G5790 Unspecified mononeuropathy of unspecified lower limb: Secondary | ICD-10-CM | POA: Diagnosis not present

## 2011-10-23 ENCOUNTER — Encounter: Payer: Self-pay | Admitting: Internal Medicine

## 2011-10-23 ENCOUNTER — Other Ambulatory Visit: Payer: Self-pay | Admitting: Ophthalmology

## 2011-10-23 DIAGNOSIS — H2589 Other age-related cataract: Secondary | ICD-10-CM | POA: Diagnosis not present

## 2011-10-23 NOTE — H&P (Signed)
  Pre-operative History and Physical for Ophthalmic Surgery  [name] 10/23/2011                  Chief Complaint: Decreased Vision  Diagnosis:  Combined Cataract Right Eye   Allergies not on file  no  Prior to Admission medications   Not on File    Planned Procedure:                                      Phacoemulsification, Posterior Chamber Intra-ocular Lens Right Eye  There were no vitals filed for this visit.  Pulse: 70         Temp: NE        Resp:  16      ROS:  non-contributory    History   Social History  . Marital Status: N/A    Spouse Name: N/A    Number of Children: N/A  . Years of Education: N/A   Occupational History  . Not on file.   Social History Main Topics  . Smoking status: Not on file  . Smokeless tobacco: Not on file  . Alcohol Use: Not on file  . Drug Use: Not on file  . Sexually Active: Not on file   Other Topics Concern  . Not on file   Social History Narrative  . No narrative on file     The following examination is for anesthesia clearance for minimally invasive Ophthalmic surgery. It is primarily to document heart and lung findings and is not intended to elucidate unknown general medical conditions inclusive of abdominal masses, lung lesions, etc.   General Constitution:  within normal limits    Alertness/Orientation:  Person, time place     yes   HEENT:  Eye Findings: Combined Cataract Right Eye                 right eye  Neck: supple without masses  Chest/Lungs: clear to auscultation  Cardiac: Normal S1 and S2 without Murmur, S3 or S4  Neuro: non-focal   Impression:  Combined Cataract Right Eye  Planned Procedure:  Phacoemulsification, Posterior Chamber Intraocular Lens Right eye     Shawn Willbanks, MD        

## 2011-10-24 ENCOUNTER — Ambulatory Visit (INDEPENDENT_AMBULATORY_CARE_PROVIDER_SITE_OTHER): Payer: Medicare Other | Admitting: *Deleted

## 2011-10-24 DIAGNOSIS — E538 Deficiency of other specified B group vitamins: Secondary | ICD-10-CM | POA: Diagnosis not present

## 2011-10-24 MED ORDER — CYANOCOBALAMIN 1000 MCG/ML IJ SOLN
1000.0000 ug | Freq: Once | INTRAMUSCULAR | Status: AC
  Administered 2011-10-24: 1000 ug via INTRAMUSCULAR

## 2011-10-26 ENCOUNTER — Encounter (HOSPITAL_COMMUNITY): Payer: Self-pay | Admitting: Pharmacy Technician

## 2011-10-31 ENCOUNTER — Ambulatory Visit (INDEPENDENT_AMBULATORY_CARE_PROVIDER_SITE_OTHER): Payer: Medicare Other | Admitting: *Deleted

## 2011-10-31 DIAGNOSIS — E538 Deficiency of other specified B group vitamins: Secondary | ICD-10-CM | POA: Diagnosis not present

## 2011-10-31 MED ORDER — CYANOCOBALAMIN 1000 MCG/ML IJ SOLN
1000.0000 ug | Freq: Once | INTRAMUSCULAR | Status: AC
  Administered 2011-10-31: 1000 ug via INTRAMUSCULAR

## 2011-11-02 ENCOUNTER — Encounter (HOSPITAL_COMMUNITY)
Admission: RE | Admit: 2011-11-02 | Discharge: 2011-11-02 | Disposition: A | Discharge status: Home / Self Care | Payer: Medicare Other | Source: Ambulatory Visit | Attending: Ophthalmology | Admitting: Ophthalmology

## 2011-11-02 ENCOUNTER — Encounter (HOSPITAL_COMMUNITY): Payer: Self-pay

## 2011-11-02 ENCOUNTER — Encounter (HOSPITAL_COMMUNITY)
Admission: RE | Admit: 2011-11-02 | Discharge: 2011-11-02 | Disposition: A | Discharge status: Home / Self Care | Payer: Medicare Other | Source: Ambulatory Visit | Attending: Anesthesiology | Admitting: Anesthesiology

## 2011-11-02 DIAGNOSIS — I359 Nonrheumatic aortic valve disorder, unspecified: Secondary | ICD-10-CM | POA: Diagnosis not present

## 2011-11-02 DIAGNOSIS — R0602 Shortness of breath: Secondary | ICD-10-CM | POA: Diagnosis not present

## 2011-11-02 DIAGNOSIS — Z01818 Encounter for other preprocedural examination: Secondary | ICD-10-CM | POA: Diagnosis not present

## 2011-11-02 DIAGNOSIS — I10 Essential (primary) hypertension: Secondary | ICD-10-CM | POA: Diagnosis not present

## 2011-11-02 DIAGNOSIS — Z01811 Encounter for preprocedural respiratory examination: Secondary | ICD-10-CM | POA: Diagnosis not present

## 2011-11-02 DIAGNOSIS — H269 Unspecified cataract: Secondary | ICD-10-CM | POA: Diagnosis not present

## 2011-11-02 LAB — BASIC METABOLIC PANEL
CO2: 26 mEq/L (ref 19–32)
Calcium: 9.8 mg/dL (ref 8.4–10.5)
GFR calc non Af Amer: 66 mL/min — ABNORMAL LOW (ref >90)
Potassium: 3.9 mEq/L (ref 3.5–5.1)
Sodium: 141 mEq/L (ref 135–145)

## 2011-11-02 LAB — CBC
Platelets: 225 10*3/uL (ref 150–400)
RBC: 4.32 MIL/uL (ref 4.22–5.81)
WBC: 6.5 10*3/uL (ref 4.0–10.5)

## 2011-11-02 NOTE — Progress Notes (Signed)
Spoke with Dr [name] .[name] Pt lives alone and will not have an adult to stay with him after surgery... The transport service will bring him to [facility]... She  States she will have to admit him for overnight then . Cell # for Dr [name] is [phone]

## 2011-11-02 NOTE — Pre-Procedure Instructions (Signed)
[address] B[address]LETT ENKE  11/02/2011   Your procedure is scheduled on:  Wednesday Nov 08, 2011.  Report to [facility] at  0915 AM.  Call this number if you have problems the morning of surgery: [phone]   Remember:   Do not eat food:After Midnight.  May have clear liquids: up to 4 Hours before arrival until 0515 am.  Clear liquids include soda, tea, black coffee, apple or grape juice, broth.  Take these medicines the morning of surgery with A SIP OF WATER:Amitriptyline (Elavil), Amlodipine (Norvasc), Gabapentin (Neurontin), and Omeprazole (Prilosec)   Do not wear jewelry, make-up or nail polish.  Do not wear lotions, powders, or perfumes. You may wear deodorant.  Do not shave 48 hours prior to surgery.  Do not bring valuables to the hospital.  Contacts, dentures or bridgework may not be worn into surgery.  Leave suitcase in the car. After surgery it may be brought to your room.  For patients admitted to the hospital, checkout time is 11:00 AM the day of discharge.   Patients discharged the day of surgery will not be allowed to drive home.  Name and phone number of your driver:   Special Instructions: CHG Shower Use Special Wash: 1/2 bottle night before surgery and 1/2 bottle morning of surgery.   Please read over the following fact sheets that you were given: Pain Booklet, Coughing and Deep Breathing, MRSA Information and Surgical Site Infection Prevention

## 2011-11-07 ENCOUNTER — Ambulatory Visit (INDEPENDENT_AMBULATORY_CARE_PROVIDER_SITE_OTHER): Payer: Medicare Other | Admitting: *Deleted

## 2011-11-07 DIAGNOSIS — E538 Deficiency of other specified B group vitamins: Secondary | ICD-10-CM

## 2011-11-07 MED ORDER — CYANOCOBALAMIN 1000 MCG/ML IJ SOLN
1000.0000 ug | Freq: Once | INTRAMUSCULAR | Status: AC
  Administered 2011-11-07: 1000 ug via INTRAMUSCULAR

## 2011-11-08 ENCOUNTER — Encounter (HOSPITAL_COMMUNITY)
Admission: RE | Disposition: A | Discharge status: Home / Self Care | Payer: Self-pay | Source: Ambulatory Visit | Attending: Ophthalmology

## 2011-11-08 ENCOUNTER — Encounter (HOSPITAL_COMMUNITY): Payer: Self-pay | Admitting: Anesthesiology

## 2011-11-08 ENCOUNTER — Ambulatory Visit (HOSPITAL_COMMUNITY): Payer: Medicare Other | Admitting: Anesthesiology

## 2011-11-08 ENCOUNTER — Ambulatory Visit (HOSPITAL_COMMUNITY)
Admission: RE | Admit: 2011-11-08 | Discharge: 2011-11-08 | Disposition: A | Discharge status: Home / Self Care | Payer: Medicare Other | Source: Ambulatory Visit | Attending: Ophthalmology | Admitting: Ophthalmology

## 2011-11-08 ENCOUNTER — Encounter (HOSPITAL_COMMUNITY): Payer: Self-pay | Admitting: *Deleted

## 2011-11-08 DIAGNOSIS — Z01818 Encounter for other preprocedural examination: Secondary | ICD-10-CM | POA: Diagnosis not present

## 2011-11-08 DIAGNOSIS — I10 Essential (primary) hypertension: Secondary | ICD-10-CM | POA: Diagnosis not present

## 2011-11-08 DIAGNOSIS — I359 Nonrheumatic aortic valve disorder, unspecified: Secondary | ICD-10-CM | POA: Insufficient documentation

## 2011-11-08 DIAGNOSIS — H269 Unspecified cataract: Secondary | ICD-10-CM | POA: Diagnosis not present

## 2011-11-08 DIAGNOSIS — K219 Gastro-esophageal reflux disease without esophagitis: Secondary | ICD-10-CM

## 2011-11-08 DIAGNOSIS — H2589 Other age-related cataract: Secondary | ICD-10-CM | POA: Diagnosis not present

## 2011-11-08 DIAGNOSIS — G5790 Unspecified mononeuropathy of unspecified lower limb: Secondary | ICD-10-CM

## 2011-11-08 DIAGNOSIS — R0602 Shortness of breath: Secondary | ICD-10-CM | POA: Insufficient documentation

## 2011-11-08 DIAGNOSIS — I739 Peripheral vascular disease, unspecified: Secondary | ICD-10-CM

## 2011-11-08 DIAGNOSIS — M48061 Spinal stenosis, lumbar region without neurogenic claudication: Secondary | ICD-10-CM | POA: Diagnosis not present

## 2011-11-08 DIAGNOSIS — E538 Deficiency of other specified B group vitamins: Secondary | ICD-10-CM

## 2011-11-08 HISTORY — PX: CATARACT EXTRACTION W/PHACO: SHX586

## 2011-11-08 SURGERY — PHACOEMULSIFICATION, CATARACT, WITH IOL INSERTION
Anesthesia: Monitor Anesthesia Care | Site: Eye | Laterality: Right | Wound class: Clean

## 2011-11-08 MED ORDER — SODIUM CHLORIDE 0.9 % IV SOLN
INTRAVENOUS | Status: DC | PRN
  Administered 2011-11-08: 11:00:00 via INTRAVENOUS

## 2011-11-08 MED ORDER — PREDNISOLONE ACETATE 1 % OP SUSP
1.0000 [drp] | OPHTHALMIC | Status: AC
  Administered 2011-11-08: 1 [drp] via OPHTHALMIC
  Filled 2011-11-08: qty 5

## 2011-11-08 MED ORDER — GATIFLOXACIN 0.5 % OP SOLN
1.0000 [drp] | OPHTHALMIC | Status: AC | PRN
  Administered 2011-11-08 (×3): 1 [drp] via OPHTHALMIC
  Filled 2011-11-08: qty 2.5

## 2011-11-08 MED ORDER — BACITRACIN-POLYMYXIN B 500-10000 UNIT/GM OP OINT
TOPICAL_OINTMENT | OPHTHALMIC | Status: DC | PRN
  Administered 2011-11-08: 1 via OPHTHALMIC

## 2011-11-08 MED ORDER — SODIUM CHLORIDE 0.9 % IR SOLN
Status: DC | PRN
  Administered 2011-11-08: 500 mL

## 2011-11-08 MED ORDER — TRIAMCINOLONE ACETONIDE 40 MG/ML IJ SUSP
INTRAMUSCULAR | Status: DC | PRN
  Administered 2011-11-08: 40 mg

## 2011-11-08 MED ORDER — HYPROMELLOSE (GONIOSCOPIC) 2.5 % OP SOLN
OPHTHALMIC | Status: DC | PRN
  Administered 2011-11-08: 2 [drp] via OPHTHALMIC

## 2011-11-08 MED ORDER — DEXAMETHASONE SODIUM PHOSPHATE 10 MG/ML IJ SOLN
INTRAMUSCULAR | Status: DC | PRN
  Administered 2011-11-08: 10 mg

## 2011-11-08 MED ORDER — BSS IO SOLN
INTRAOCULAR | Status: DC | PRN
  Administered 2011-11-08: 500 mL via INTRAOCULAR

## 2011-11-08 MED ORDER — BUPIVACAINE HCL 0.75 % IJ SOLN
INTRAMUSCULAR | Status: DC | PRN
  Administered 2011-11-08: 10 mL

## 2011-11-08 MED ORDER — PROPOFOL 10 MG/ML IV BOLUS
INTRAVENOUS | Status: DC | PRN
  Administered 2011-11-08: 50 mg via INTRAVENOUS

## 2011-11-08 MED ORDER — LIDOCAINE HCL (PF) 2 % IJ SOLN
INTRAMUSCULAR | Status: DC | PRN
  Administered 2011-11-08: 10 mL

## 2011-11-08 MED ORDER — TETRACAINE HCL 0.5 % OP SOLN
2.0000 [drp] | OPHTHALMIC | Status: AC
  Administered 2011-11-08: 2 [drp] via OPHTHALMIC
  Filled 2011-11-08: qty 2

## 2011-11-08 MED ORDER — NA CHONDROIT SULF-NA HYALURON 40-30 MG/ML IO SOLN
INTRAOCULAR | Status: DC | PRN
  Administered 2011-11-08: 0.5 mL via INTRAOCULAR

## 2011-11-08 MED ORDER — PROVISC 10 MG/ML IO SOLN
INTRAOCULAR | Status: DC | PRN
  Administered 2011-11-08: .85 mL via INTRAOCULAR

## 2011-11-08 MED ORDER — BSS IO SOLN
INTRAOCULAR | Status: DC | PRN
  Administered 2011-11-08: 15 mL via INTRAOCULAR

## 2011-11-08 MED ORDER — CEFAZOLIN SUBCONJUNCTIVAL INJECTION 100 MG/0.5 ML
1.0000 mL | INJECTION | SUBCONJUNCTIVAL | Status: AC
  Administered 2011-11-08: 1 mL via SUBCONJUNCTIVAL
  Filled 2011-11-08: qty 1

## 2011-11-08 MED ORDER — WATER FOR IRRIGATION, STERILE IR SOLN
Status: DC | PRN
  Administered 2011-11-08: 500 mL

## 2011-11-08 MED ORDER — EPINEPHRINE HCL 1 MG/ML IJ SOLN
INTRAMUSCULAR | Status: DC | PRN
  Administered 2011-11-08: .3 mg

## 2011-11-08 MED ORDER — PHENYLEPHRINE HCL 2.5 % OP SOLN
1.0000 [drp] | OPHTHALMIC | Status: AC | PRN
  Administered 2011-11-08 (×3): 1 [drp] via OPHTHALMIC
  Filled 2011-11-08: qty 3

## 2011-11-08 SURGICAL SUPPLY — 62 items
APL SRG 3 HI ABS STRL LF PLS (MISCELLANEOUS) ×1
APPLICATOR COTTON TIP 6IN STRL (MISCELLANEOUS) ×2 IMPLANT
APPLICATOR DR MATTHEWS STRL (MISCELLANEOUS) ×2 IMPLANT
BAG FLD CLT MN 6.25X3.5 (WOUND CARE) ×1
BAG MINI COLL DRAIN (WOUND CARE) ×2 IMPLANT
BLADE EYE MINI 60D BEAVER (BLADE) IMPLANT
BLADE KERATOME 2.75 (BLADE) ×2 IMPLANT
BLADE STAB KNIFE 15DEG (BLADE) IMPLANT
CANNULA ANTERIOR CHAMBER 27GA (MISCELLANEOUS) IMPLANT
CLOTH BEACON ORANGE TIMEOUT ST (SAFETY) ×2 IMPLANT
DRAPE OPHTHALMIC 77X100 STRL (CUSTOM PROCEDURE TRAY) ×2 IMPLANT
DRAPE POUCH INSTRU U-SHP 10X18 (DRAPES) ×2 IMPLANT
DRSG TEGADERM 4X4.75 (GAUZE/BANDAGES/DRESSINGS) ×2 IMPLANT
FILTER BLUE MILLIPORE (MISCELLANEOUS) IMPLANT
GLOVE SS BIOGEL STRL SZ 6.5 (GLOVE) ×1 IMPLANT
GLOVE SUPERSENSE BIOGEL SZ 6.5 (GLOVE) ×1
GLOVE SURG SS PI 6.5 STRL IVOR (GLOVE) ×1 IMPLANT
GOWN PREVENTION PLUS XLARGE (GOWN DISPOSABLE) ×2 IMPLANT
GOWN STRL NON-REIN LRG LVL3 (GOWN DISPOSABLE) ×2 IMPLANT
KIT BASIN OR (CUSTOM PROCEDURE TRAY) ×2 IMPLANT
KIT ROOM TURNOVER OR (KITS) IMPLANT
KNIFE GRIESHABER SHARP 2.5MM (MISCELLANEOUS) ×2 IMPLANT
LENS IOL ACRYSOF MP POST 18.0 (Intraocular Lens) ×1 IMPLANT
MASK EYE SHIELD (GAUZE/BANDAGES/DRESSINGS) ×1 IMPLANT
NDL 18GX1X1/2 (RX/OR ONLY) (NEEDLE) IMPLANT
NDL 25GX 5/8IN NON SAFETY (NEEDLE) ×1 IMPLANT
NDL FILTER BLUNT 18X1 1/2 (NEEDLE) IMPLANT
NDL HYPO 30X.5 LL (NEEDLE) ×2 IMPLANT
NEEDLE 18GX1X1/2 (RX/OR ONLY) (NEEDLE) ×2 IMPLANT
NEEDLE 22X1 1/2 (OR ONLY) (NEEDLE) ×2 IMPLANT
NEEDLE 25GX 5/8IN NON SAFETY (NEEDLE) IMPLANT
NEEDLE FILTER BLUNT 18X 1/2SAF (NEEDLE) ×1
NEEDLE FILTER BLUNT 18X1 1/2 (NEEDLE) ×1 IMPLANT
NEEDLE HYPO 30X.5 LL (NEEDLE) ×4 IMPLANT
NS IRRIG 1000ML POUR BTL (IV SOLUTION) ×2 IMPLANT
PACK CATARACT CUSTOM (CUSTOM PROCEDURE TRAY) ×2 IMPLANT
PACK CATARACT MCHSCP (PACKS) ×2 IMPLANT
PACK COMBINED CATERACT/VIT 23G (OPHTHALMIC RELATED) IMPLANT
PAD ARMBOARD 7.5X6 YLW CONV (MISCELLANEOUS) ×4 IMPLANT
PAD EYE OVAL STERILE LF (GAUZE/BANDAGES/DRESSINGS) ×1 IMPLANT
PHACO TIP KELMAN 45DEG (TIP) ×2 IMPLANT
PROBE ANTERIOR 20G W/INFUS NDL (MISCELLANEOUS) IMPLANT
ROLLS DENTAL (MISCELLANEOUS) IMPLANT
SHUTTLE MONARCH TYPE A (NEEDLE) ×2 IMPLANT
SOLUTION ANTI FOG 6CC (MISCELLANEOUS) IMPLANT
SPEAR EYE SURG WECK-CEL (MISCELLANEOUS) ×2 IMPLANT
SUT ETHILON 10-0 CS-B-6CS-B-6 (SUTURE)
SUT ETHILON 5 0 P 3 18 (SUTURE)
SUT ETHILON 9 0 TG140 8 (SUTURE) IMPLANT
SUT NYLON ETHILON 5-0 P-3 1X18 (SUTURE) IMPLANT
SUT PLAIN 6 0 TG1408 (SUTURE) IMPLANT
SUT POLY NON ABSORB 10-0 8 STR (SUTURE) IMPLANT
SUT VICRYL 6 0 S 29 12 (SUTURE) IMPLANT
SUTURE EHLN 10-0 CS-B-6CS-B-6 (SUTURE) IMPLANT
SYR 20CC LL (SYRINGE) IMPLANT
SYR 5ML LL (SYRINGE) IMPLANT
SYR TB 1ML LUER SLIP (SYRINGE) ×1 IMPLANT
SYRINGE 10CC LL (SYRINGE) IMPLANT
TAPE PAPER MEDFIX 1IN X 10YD (GAUZE/BANDAGES/DRESSINGS) ×1 IMPLANT
TOWEL OR 17X24 6PK STRL BLUE (TOWEL DISPOSABLE) ×4 IMPLANT
WATER STERILE IRR 1000ML POUR (IV SOLUTION) ×2 IMPLANT
WIPE INSTRUMENT VISIWIPE 73X73 (MISCELLANEOUS) ×2 IMPLANT

## 2011-11-08 NOTE — Transfer of Care (Signed)
Immediate Anesthesia Transfer of Care Note  Patient: [name]  Procedure(s) Performed: Procedure(s) (LRB): CATARACT EXTRACTION PHACO AND INTRAOCULAR LENS PLACEMENT (IOC) (Right)  Patient Location: Short Stay  Anesthesia Type: MAC  Level of Consciousness: awake, alert  and oriented  Airway & Oxygen Therapy: Patient Spontanous Breathing  Post-op Assessment: Report given to PACU RN, Post -op Vital signs reviewed and stable and Patient moving all extremities  Post vital signs: Reviewed and stable  Complications: No apparent anesthesia complications

## 2011-11-08 NOTE — Op Note (Signed)
[name] 11/08/2011 Cataract  Procedure: Phacoemulsification, Posterior Chamber Intra-ocular Lens Operative Eye:  right eye  Surgeon: Shade Flood Estimated Blood Loss: minimal Specimens for Pathology:  None Complications: posterior capsule tear related to decreased visualization with pre-op cloudy cornea  The patient was prepared and draped in the usual manner for ocular surgery on the right eye. A Cook lid speculum was placed. A peripheral clear corneal incision was made at the surgical limbus centered at the 11:00 meridian. A separate clear corneal stab incision was made with a 15 degree blade at the 2:00 meridian to permit bi-manual technique. Provisc was instilled into the anterior chamber through that incision.  A keratome was used to create a self sealing incision entering the anterior chamber at the 11:00 meridian. A capsulorhexis was performed using a bent 25g needle. The lens was hydrodissected and the nucleus was hydrodilineated using a Nichammin cannula. The Chang chopper was inserted and used to rotate the lens to insure adequate lens mobility. The phacoemulsification handpiece was inserted and a combined phaco-chop technique was employed, fracturing the lens into separate sections with subsequent removal with the phaco handpiece.   The I/A cannula was used to remove remaining lens cortex. We able to remove all cortex except a small section at 12:0clock. Adequate anterior capsule was present so I removed all cortex as well as possible. The IOL was placed in the lens sulcus. There was vitreous in the [facility].  Provisc was instilled and used to deepen the anterior chamber and posterior capsule bag. The Monarch injector was used to place a folded Acrysof [id number] PC IOL, + 17.50  diopters, into the capsule bag. A Sinskey lens hook was used to dial in the trailing haptic.  The I/A cannula was used to remove the viscoelastic from the anterior chamber. BSS was used to bring IOP to the desired  range and the wound was checked to insure it was watertight. Subconjunctival injections of Ance 100/0.61ml and Dexamethasone 4mg /51ml were placed without complication. The lid speculum and drapes were removed and the patient's eye was patched with Polymixin/Bacitracin ophthalmic ointment. An eye shield was placed and the patient was transferred alert and conversant from the operating room to the post-operative recovery area.   Shade Flood, MD

## 2011-11-08 NOTE — Interval H&P Note (Signed)
History and Physical Interval Note:  11/08/2011 9:19 AM  [name]  has presented today for surgery, with the diagnosis of Combined Cataract Right Eye  The various methods of treatment have been discussed with the patient and family. After consideration of risks, benefits and other options for treatment, the patient has consented to  Procedure(s) (LRB): CATARACT EXTRACTION PHACO AND INTRAOCULAR LENS PLACEMENT (IOC) (Right) as a surgical intervention .  The patients' history has been reviewed, patient examined, no change in status, stable for surgery.  I have reviewed the patients' chart and labs.  Questions were answered to the patient's satisfaction.     Shade Flood, MD

## 2011-11-08 NOTE — Interval H&P Note (Signed)
History and Physical Interval Note:  11/08/2011 10:22 AM  [name]  has presented today for surgery, with the diagnosis of Combined Cataract Right Eye  The various methods of treatment have been discussed with the patient and family. After consideration of risks, benefits and other options for treatment, the patient has consented to  Procedure(s) (LRB): CATARACT EXTRACTION PHACO AND INTRAOCULAR LENS PLACEMENT (IOC) (Right) as a surgical intervention .  The patients' history has been reviewed, patient examined, no change in status, stable for surgery.  I have reviewed the patients' chart and labs.  Questions were answered to the patient's satisfaction.     Shade Flood, MD

## 2011-11-08 NOTE — Anesthesia Preprocedure Evaluation (Signed)
Anesthesia Evaluation  Patient identified by MRN, date of birth, ID band Patient awake    Reviewed: Allergy & Precautions, H&P , NPO status , Patient's Chart, lab work & pertinent test results  Airway Mallampati: III TM Distance: >3 FB Neck ROM: Full    Dental No notable dental hx. (+) Edentulous Upper and Dental Advisory Given   Pulmonary shortness of breath,  breath sounds clear to auscultation  Pulmonary exam normal       Cardiovascular hypertension, On Medications + Valvular Problems/Murmurs AS Rhythm:Regular Rate:Normal + Systolic murmurs    Neuro/Psych negative neurological ROS  negative psych ROS   GI/Hepatic Neg liver ROS, Medicated and Controlled,  Endo/Other  negative endocrine ROSMorbid obesity  Renal/GU negative Renal ROS  negative genitourinary   Musculoskeletal   Abdominal   Peds  Hematology negative hematology ROS (+)   Anesthesia Other Findings   Reproductive/Obstetrics negative OB ROS                           Anesthesia Physical Anesthesia Plan  ASA: III  Anesthesia Plan: MAC   Post-op Pain Management:    Induction: Intravenous  Airway Management Planned: Mask  Additional Equipment:   Intra-op Plan:   Post-operative Plan:   Informed Consent: I have reviewed the patients History and Physical, chart, labs and discussed the procedure including the risks, benefits and alternatives for the proposed anesthesia with the patient or authorized representative who has indicated his/her understanding and acceptance.   Dental advisory given  Plan Discussed with: CRNA  Anesthesia Plan Comments:         Anesthesia Quick Evaluation

## 2011-11-08 NOTE — Anesthesia Postprocedure Evaluation (Signed)
  Anesthesia Post-op Note  Patient: [name] SURGEON  Procedure(s) Performed: Procedure(s) (LRB): CATARACT EXTRACTION PHACO AND INTRAOCULAR LENS PLACEMENT (IOC) (Right)  Patient Location: Short Stay  Anesthesia Type: MAC  Level of Consciousness: awake, alert  and oriented  Airway and Oxygen Therapy: Patient Spontanous Breathing  Post-op Pain: none  Post-op Assessment: Post-op Vital signs reviewed, Patient's Cardiovascular Status Stable, Respiratory Function Stable, Patent Airway and No signs of Nausea or vomiting  Post-op Vital Signs: Reviewed and stable  Complications: No apparent anesthesia complications

## 2011-11-08 NOTE — Anesthesia Procedure Notes (Signed)
Procedure Name: MAC Date/Time: 11/08/2011 10:51 AM Performed by: Margaree Mackintosh Pre-anesthesia Checklist: Patient identified, Timeout performed, Emergency Drugs available, Suction available and Patient being monitored Patient Re-evaluated:Patient Re-evaluated prior to inductionOxygen Delivery Method: Nasal cannula Intubation Type: IV induction Placement Confirmation: positive ETCO2 Dental Injury: Teeth and Oropharynx as per pre-operative assessment

## 2011-11-08 NOTE — Addendum Note (Signed)
Addendum  created 11/08/11 1201 by W. Edmond Yitzel Shasteen, MD   Modules edited:Anesthesia Attestations    

## 2011-11-08 NOTE — Addendum Note (Signed)
Addendum  created 11/08/11 1201 by Zenon Mayo, MD   Modules edited:Anesthesia Attestations

## 2011-11-08 NOTE — Discharge Instructions (Signed)
[name]      11/08/2011  Post-operative instructions for Shawn Paff L. [name], MD  Caring for your eye:  Do not rub your eye and wash your hands before touching the eye area. This is important to avoid injury and infection.  You may use sterile gauze pads and sterile eye wash to cleanse the lid margins of mucous accumulation.  DO NOT REUSE GAUZE after wiping the eye. Use a new clean one if needed.  Be certain not to touch the top of the medication bottle to the eyelids to avoid contaminating your medicine bottle and causing infection.  After eye surgery the surface of the eye and eyelids may be puffy. You may note a red blotch(s) on the surface of the eye or a bruise on your eyelid. These are usually related to injections or instruments used in surgery and are not cause for alarm. You may also notice blood tinged tears on your eye pad, this is common and not cause for alarm. These findings will subside over the coming week or two.  Activity:  No jarring activities. Walking with assistance early on as needed is advised. Avoid straining and let me know if you have significant constipation. Do not bend over at the waist with the head below your waist to minimize risk of bleeding inside the eye.  Avoid getting water from washing your hair or showering  in your eye. Patch the eye if necessary during bathing to avoid contamination.    You may: watch television, work on you computer, read books, eat out, ride in a car.  Sleeping Position:    **  You do not have a gas bubble in your eye, you may sleep in your         customary manner.   Wear your eye shield for naps and sleeping at night for the first two weeks.   Wait a few minutes between your eye drops when placing them.   Resume your customary medications on your normal schedule. Shade Flood MD    After office hours I can be reached by calling: [phone]                                                                      or   [phone]

## 2011-11-08 NOTE — Preoperative (Signed)
Beta Blockers   Reason not to administer Beta Blockers:Not Applicable 

## 2011-11-08 NOTE — H&P (View-Only) (Signed)
  Pre-operative History and Physical for Ophthalmic Surgery  [name] 10/23/2011                  Chief Complaint: Decreased Vision  Diagnosis:  Combined Cataract Right Eye   Allergies not on file  no  Prior to Admission medications   Not on File    Planned Procedure:                                      Phacoemulsification, Posterior Chamber Intra-ocular Lens Right Eye  There were no vitals filed for this visit.  Pulse: 70         Temp: NE        Resp:  16      ROS:  non-contributory    History   Social History  . Marital Status: N/A    Spouse Name: N/A    Number of Children: N/A  . Years of Education: N/A   Occupational History  . Not on file.   Social History Main Topics  . Smoking status: Not on file  . Smokeless tobacco: Not on file  . Alcohol Use: Not on file  . Drug Use: Not on file  . Sexually Active: Not on file   Other Topics Concern  . Not on file   Social History Narrative  . No narrative on file     The following examination is for anesthesia clearance for minimally invasive Ophthalmic surgery. It is primarily to document heart and lung findings and is not intended to elucidate unknown general medical conditions inclusive of abdominal masses, lung lesions, etc.   General Constitution:  within normal limits    Alertness/Orientation:  Person, time place     yes   HEENT:  Eye Findings: Combined Cataract Right Eye                 right eye  Neck: supple without masses  Chest/Lungs: clear to auscultation  Cardiac: Normal S1 and S2 without Murmur, S3 or S4  Neuro: non-focal   Impression:  Combined Cataract Right Eye  Planned Procedure:  Phacoemulsification, Posterior Chamber Intraocular Lens Right eye     Shade Flood, MD

## 2011-11-09 ENCOUNTER — Encounter (HOSPITAL_COMMUNITY): Payer: Self-pay | Admitting: Ophthalmology

## 2011-11-13 ENCOUNTER — Other Ambulatory Visit: Payer: Self-pay | Admitting: *Deleted

## 2011-11-13 NOTE — Telephone Encounter (Signed)
Also needs refill on Etodolac 300mg  #90 - org filled 04/18/11 Dr Denton Meek - last refill 08/29/11. Uses [facility].

## 2011-11-14 ENCOUNTER — Ambulatory Visit: Payer: Medicare Other

## 2011-11-15 ENCOUNTER — Encounter: Payer: Self-pay | Admitting: Internal Medicine

## 2011-11-15 ENCOUNTER — Ambulatory Visit (INDEPENDENT_AMBULATORY_CARE_PROVIDER_SITE_OTHER): Payer: Medicare Other | Admitting: Internal Medicine

## 2011-11-15 VITALS — BP 128/75 | HR 81 | Temp 98.7°F | Ht 74.0 in | Wt 296.8 lb

## 2011-11-15 DIAGNOSIS — E538 Deficiency of other specified B group vitamins: Secondary | ICD-10-CM

## 2011-11-15 DIAGNOSIS — M25559 Pain in unspecified hip: Secondary | ICD-10-CM

## 2011-11-15 DIAGNOSIS — G5790 Unspecified mononeuropathy of unspecified lower limb: Secondary | ICD-10-CM

## 2011-11-15 MED ORDER — GABAPENTIN 300 MG PO CAPS: 600.0000 mg | ORAL_CAPSULE | Freq: Three times a day (TID) | ORAL | Status: DC

## 2011-11-15 MED ORDER — CYANOCOBALAMIN 1000 MCG/ML IJ SOLN
1000.0000 ug | Freq: Once | INTRAMUSCULAR | Status: AC
  Administered 2011-11-15: 1000 ug via INTRAMUSCULAR

## 2011-11-15 NOTE — Progress Notes (Signed)
Subjective:   Patient ID: [name] male   DOB: December 18, 1945 66 y.o.   MRN: [id number]  HPI: [name] is a 66 y.o. man with past medical history significant for hypertension, GERD and musculoskeletal pain but who has had many inappropriately negative UDS and no longer recieves pain medications from our clinic, who presents today for evaluation of his chronic leg pain.   He tells me his chronic leg pain is getting better. He notes fewer episodes of pain. He thinks it is about 1/2 as severe as before. He has more energy and has been walking and mowing the lawnmower and does not get as tired. Also states SOB is improving. He still has paraesthesias and these have been more difficult to resolve. The resolutions of his symptoms coincided with the onset o f B12 injections.   Denies Chest pain Nausea, vomiting, abdominal pain, Headache, fatigue, numbness, or other complaint today.   Past Medical History  Diagnosis Date  . Hypertension   . Back pain, chronic     MRI 2002, spinal stenosis mild to moderate L2-3 and L3-4, advanced degernerative changes MRI 2010  . Aortic stenosis, mild     1.53 sqcm 9/07, now 2.73 (11/2009) with moderate regurgitation  . Enlarged LA (left atrium)     mild dilatation per echo  . Chest pain, unspecified     neg cath 2003, neg myoview 2008  . Shortness of breath     exertion   Current Outpatient Prescriptions  Medication Sig Dispense Refill  . amitriptyline (ELAVIL) 25 MG tablet Take 1 tablet (25 mg total) by mouth every morning.  30 tablet  4  . amLODipine (NORVASC) 10 MG tablet Take 1 tablet (10 mg total) by mouth daily.  30 tablet  11  . aspirin 81 MG EC tablet Take 81 mg by mouth daily.        . capsicum (ZOSTRIX) 0.075 % topical cream Apply 1 application topically 3 (three) times daily.      . diclofenac sodium (VOLTAREN) 1 % GEL Apply 1 application topically 4 (four) times daily.  100 g  1  . hydrocerin (EUCERIN) CREA Apply 1 application  topically 2 (two) times daily.  454 g  2  . lisinopril-hydrochlorothiazide (PRINZIDE,ZESTORETIC) 20-12.5 MG per tablet Take 2 tablets by mouth daily.  60 tablet  11  . omeprazole (PRILOSEC) 40 MG capsule Take 1 capsule (40 mg total) by mouth daily.  30 capsule  11  . gabapentin (NEURONTIN) 300 MG capsule Take 2 capsules (600 mg total) by mouth 3 (three) times daily.  180 capsule  3  . DISCONTD: gabapentin (NEURONTIN) 300 MG capsule Take 2 capsules (600 mg total) by mouth 3 (three) times daily. Take 3 capsules by mouth three times daily.  90 capsule  3   Current Facility-Administered Medications  Medication Dose Route Frequency Provider Last Rate Last Dose  . cyanocobalamin ((VITAMIN B-12)) injection 1,000 mcg  1,000 mcg Intramuscular Once Duwaine Maxin, MD   1,000 mcg at 11/15/11 1709   Family History  Problem Relation Age of Onset  . Heart disease Mother   . Cancer Father 35    colon cancer   . Kidney disease Brother     2 of nine brothers on dialysis  . Hypertension Sister   . Anesthesia problems Neg Hx    History   Social History  . Marital Status: Single    Spouse Name: N/A    Number of Children: N/A  .  Years of Education: N/A   Social History Main Topics  . Smoking status: Former Smoker -- 0.5 packs/day for 30 years    Types: Cigarettes    Quit date: 11/05/2010  . Smokeless tobacco: None  . Alcohol Use: No  . Drug Use: No  . Sexually Active: None   Other Topics Concern  . None   Social History Narrative  . None   Review of Systems: Per history of present illness  Objective:  Physical Exam: Filed Vitals:   11/15/11 1550  BP: 128/75  Pulse: 81  Temp: 98.7 F (37.1 C)  TempSrc: Oral  Height: 6\' 2"  (1.88 m)  Weight: 296 lb 12.8 oz (134.628 kg)   Constitutional: Vital signs reviewed.  Patient is a well-developed and well-nourished man in no acute distress and cooperative with exam. Alert and oriented x3.  Head: Normocephalic and atraumatic Eyes: PERRL,  EOMI, conjunctivae normal, No scleral icterus.  Cardiovascular: RRR, S1 normal, S2 normal, no MRG, DP and PT pulses symmetric and intact bilaterally Pulmonary/Chest: CTAB, no wheezes, rales, or rhonchi Abdominal: Soft. Non-tender, non-distended, bowel sounds are normal, no masses, organomegaly, or guarding present.  Musculoskeletal: No joint deformities, erythema, or stiffness, ROM full and no nontender. There is 1+ pitting edema in the bilateral lower extremities.  Neurological: A&O x3, Strenght is normal and symmetric bilaterally, cranial nerve II-XII are grossly intact, no focal motor deficit, sensory intact to light touch bilaterally.  Skin: Warm, dry and intact. No rash, Psychiatric: Normal mood and affect. Speech is very difficult to comprehend however behavior is normal.   Assessment & Plan:    See problem oriented charting.

## 2011-11-15 NOTE — Patient Instructions (Signed)
Return to clinic to see [place] in 3 months. At that time, we will consider starting Etodolac.  Please bring all your medications to your next clinic appointment.  Neuropathic Pain We often think that pain has a physical cause. If we get rid of the cause, the pain should go away. However, nerves themselves can also cause pain. This pain often does not go away easily. It is called neuropathic pain, which means nerve abnormality. It may be difficult to understand for the patients who have it and for the treating caregivers. Neuropathic pain may be caused by an injury or failure of the nervous system. The pain often results from an injury, but this injury may or may not be the cause of actual damage to the nervous system. Nerves can be penetrated or squashed by tumors, strangled by scar tissue, or become inflamed due to infection. Neuropathic pain is often caused by nerve injury due to diabetes.Alcohol abuse may also lead to neuropathic pain. Neuropathic pain often seems to have no cause. The diagnosis is made when no other cause is found.  Pain may persist for months or years following the healing of damaged tissues. When this happens, pain signals no longer sound an alarm about current injuries or injuries about to happen. Instead, the alarm system itself is not working correctly.  CHARACTERISTICS OF NEUROPATHIC PAIN  Severe, sharp, electric shock-like, shooting, lightening-like, knife-like.   Pins and needles sensation.   Deep burning, deep cold, or deep ache.   Persistent numbness, tingling, or weakness.   Pain resulting from a light touch or other stimulus that would not usually cause pain.   Increased sensitivity to something that would normally cause pain, such as a pinprick.  Neuropathic pain may get worse instead of better over time. For some people, it can lead to serious disability. It is important to be aware that severe injury in a limb can occur without a proper, protective pain  response.Burns, cuts, and other injuries may go unnoticed. Without proper treatment, these injuries can become infected or lead to further disability. Take any injury seriously, and consult your caregiver for treatment. TREATMENT  Neuropathic pain is often long-lasting and tends not to get better when treated with opioids (narcotic types of pain medicine). It may respond well to other drugs such as antiseizure and antidepressant medicines. Usually, neuropathic pain does not completely go away, but partial improvement is often possible with proper treatment. Your caregivers, along with you, will select the medicines which best allow you to live a normal life. Do not be discouraged if you do not get immediate relief. Sometimes different medicines or a combination of medicines will be tried before you receive the benefits you are hoping for. SEEK IMMEDIATE MEDICAL CARE IF:  There is a sudden change in the quality of your pain, especially if the change is on only one side of the body.   You notice changes of the skin, such as redness, black or purple discoloration, swelling, or an ulcer.   You cannot move the affected limbs.  Document Released: 02/03/2004 Document Revised: 06/01/2011 Document Reviewed: 07/21/2010 [facility] Patient Information 2012 [place], [place].  Degenerative Arthritis You have osteoarthritis. This is the wear and tear arthritis that comes with aging. It is also called degenerative arthritis. This is common in people past middle age. It is caused by stress on the joints. The large weight bearing joints of the lower extremities are most often affected. The knees, hips, back, neck, and hands can become painful,  swollen, and stiff. This is the most common type of arthritis. It comes on with age, carrying too much weight, or from an injury. Treatment includes resting the sore joint until the pain and swelling improve. Crutches or a walker may be needed for severe flares. Only take  over-the-counter or prescription medicines for pain, discomfort, or fever as directed by your caregiver. Local heat therapy may improve motion. Cortisone shots into the joint are sometimes used to reduce pain and swelling during flares. Osteoarthritis is usually not crippling and progresses slowly. There are things you can do to decrease pain:  Avoid high impact activities.   Exercise regularly.   Low impact exercises such as walking, biking and swimming help to keep the muscles strong and keep normal joint function.   Stretching helps to keep your range of motion.   Lose weight if you are overweight. This reduces joint stress.  In severe cases when you have pain at rest or increasing disability, joint surgery may be helpful. See your caregiver for follow-up treatment as recommended.  SEEK IMMEDIATE MEDICAL CARE IF:   You have severe joint pain.   Marked swelling and redness in your joint develops.   You develop a high fever.  Document Released: 06/12/2005 Document Revised: 06/01/2011 Document Reviewed: 11/12/2006 [facility] Patient Information 2012 [place], [place].

## 2011-11-15 NOTE — Assessment & Plan Note (Addendum)
Improving with B12. As of today has completed daily x 1 week and weekly tx for 1 month injections. Will have him come back monthly from now on until b12 normalizes. After that oral supplementation can be done.  Next injection visit should be June 25. We will have him get b12 tested when he meets his new PCP in July.

## 2011-11-15 NOTE — Assessment & Plan Note (Addendum)
Getting better with B12, amitriptyline,  and gabapentin.  Refilled gabapentin

## 2011-11-15 NOTE — Assessment & Plan Note (Signed)
[name] states his pain today is more in his hip and sounds like osteoarthritis. I will continue to treat for neuropathic pain today, but he may need a NSAID at his next visit in July.

## 2011-11-24 ENCOUNTER — Other Ambulatory Visit (INDEPENDENT_AMBULATORY_CARE_PROVIDER_SITE_OTHER): Payer: Medicare Other

## 2011-11-24 ENCOUNTER — Other Ambulatory Visit: Payer: Medicare Other

## 2011-11-24 ENCOUNTER — Ambulatory Visit: Payer: Medicare Other

## 2011-11-24 DIAGNOSIS — E538 Deficiency of other specified B group vitamins: Secondary | ICD-10-CM

## 2011-11-24 NOTE — Progress Notes (Signed)
Letter mailed to pt about B12 injection monthly per Dr Leatrice Jewels and to sch FU 2 months - July 25ish to meet new PCP and B12 recked per Dr Candy Sledge. Unable to reach pt at [phone] or th reach Mrs Arsenio Katz [phone]. Stanton Kidney Kestrel Mis RN 11/24/11 11:30AM

## 2011-11-27 LAB — FOLATE RBC: RBC Folate: 624 ng/mL (ref >=366)

## 2011-12-07 ENCOUNTER — Other Ambulatory Visit: Payer: Self-pay | Admitting: Internal Medicine

## 2011-12-19 ENCOUNTER — Ambulatory Visit (INDEPENDENT_AMBULATORY_CARE_PROVIDER_SITE_OTHER): Payer: Medicare Other | Admitting: *Deleted

## 2011-12-19 DIAGNOSIS — E538 Deficiency of other specified B group vitamins: Secondary | ICD-10-CM | POA: Diagnosis not present

## 2011-12-19 MED ORDER — CYANOCOBALAMIN 1000 MCG/ML IJ SOLN
1000.0000 ug | Freq: Once | INTRAMUSCULAR | Status: AC
  Administered 2011-12-19: 1000 ug via INTRAMUSCULAR

## 2012-01-16 ENCOUNTER — Ambulatory Visit (INDEPENDENT_AMBULATORY_CARE_PROVIDER_SITE_OTHER): Payer: Medicare Other | Admitting: *Deleted

## 2012-01-16 DIAGNOSIS — E538 Deficiency of other specified B group vitamins: Secondary | ICD-10-CM | POA: Diagnosis not present

## 2012-01-16 MED ORDER — CYANOCOBALAMIN 1000 MCG/ML IJ SOLN
1000.0000 ug | Freq: Once | INTRAMUSCULAR | Status: AC
  Administered 2012-01-16: 1000 ug via INTRAMUSCULAR

## 2012-02-16 ENCOUNTER — Ambulatory Visit (INDEPENDENT_AMBULATORY_CARE_PROVIDER_SITE_OTHER): Payer: Medicare Other | Admitting: *Deleted

## 2012-02-16 ENCOUNTER — Telehealth: Payer: Self-pay | Admitting: *Deleted

## 2012-02-16 DIAGNOSIS — E538 Deficiency of other specified B group vitamins: Secondary | ICD-10-CM | POA: Diagnosis not present

## 2012-02-16 MED ORDER — CYANOCOBALAMIN 1000 MCG/ML IJ SOLN
1000.0000 ug | Freq: Once | INTRAMUSCULAR | Status: AC
  Administered 2012-02-16: 1000 ug via INTRAMUSCULAR

## 2012-02-16 NOTE — Telephone Encounter (Signed)
Whie pt was in clinic for B12 injection - has good and bad days of pain in left leg and back. Has problems with transportation - decided to make appt with doctor when next B12 is due - 1 month. If problem gets worse to call clinic for earlier appt. Stanton Kidney Amaurie Wandel RN 02/16/12 4PM

## 2012-03-07 DIAGNOSIS — H2589 Other age-related cataract: Secondary | ICD-10-CM | POA: Diagnosis not present

## 2012-03-14 ENCOUNTER — Encounter: Payer: Self-pay | Admitting: Internal Medicine

## 2012-03-14 ENCOUNTER — Ambulatory Visit (INDEPENDENT_AMBULATORY_CARE_PROVIDER_SITE_OTHER): Payer: Medicare Other | Admitting: Internal Medicine

## 2012-03-14 VITALS — BP 135/74 | HR 68 | Temp 97.3°F | Wt 292.5 lb

## 2012-03-14 DIAGNOSIS — I10 Essential (primary) hypertension: Secondary | ICD-10-CM | POA: Diagnosis not present

## 2012-03-14 DIAGNOSIS — M25561 Pain in right knee: Secondary | ICD-10-CM

## 2012-03-14 DIAGNOSIS — M25569 Pain in unspecified knee: Secondary | ICD-10-CM | POA: Diagnosis not present

## 2012-03-14 DIAGNOSIS — Z23 Encounter for immunization: Secondary | ICD-10-CM | POA: Diagnosis not present

## 2012-03-14 DIAGNOSIS — E538 Deficiency of other specified B group vitamins: Secondary | ICD-10-CM

## 2012-03-14 MED ORDER — GABAPENTIN 300 MG PO CAPS: 600.0000 mg | ORAL_CAPSULE | Freq: Three times a day (TID) | ORAL | Status: DC

## 2012-03-14 MED ORDER — AMITRIPTYLINE HCL 25 MG PO TABS: 25.0000 mg | ORAL_TABLET | Freq: Every morning | ORAL | Status: DC

## 2012-03-14 MED ORDER — CYANOCOBALAMIN 1000 MCG/ML IJ SOLN
1000.0000 ug | Freq: Once | INTRAMUSCULAR | Status: AC
  Administered 2012-03-14: 1000 ug via INTRAMUSCULAR

## 2012-03-14 NOTE — Progress Notes (Signed)
Subjective:   Patient ID: [name] male   DOB: [birth year] 66 y.o.   MRN: [id number]  HPI: [name] is a 66 y.o. man who presents to clinic today for follow up on his chronic medical conditions including hypertension and b12 deficiency.  See Problem focused Assessment and Plan for full details of his chronic medical conditions.   He also states that he has had right knee pain for the last week.  The pain is in the medial compartment on the right knee.  He denies popping, clicking, or trauma.  He states that the knee was was mildly swollen yesterday but there was no redness, fever or chills.  He tried the pain medication and rubbed icy hot on it but didn't help.   Past Medical History  Diagnosis Date  . Hypertension   . Back pain, chronic     MRI 2002, spinal stenosis mild to moderate L2-3 and L3-4, advanced degernerative changes MRI 2010  . Aortic stenosis, mild     1.53 sqcm 9/07, now 2.73 (11/2009) with moderate regurgitation  . Enlarged LA (left atrium)     mild dilatation per echo  . Chest pain, unspecified     neg cath 2003, neg myoview 2008  . Shortness of breath     exertion   Current Outpatient Prescriptions  Medication Sig Dispense Refill  . amitriptyline (ELAVIL) 25 MG tablet Take 1 tablet (25 mg total) by mouth every morning.  30 tablet  4  . amLODipine (NORVASC) 10 MG tablet Take 1 tablet (10 mg total) by mouth daily.  30 tablet  11  . aspirin 81 MG EC tablet Take 81 mg by mouth daily.        . capsicum (ZOSTRIX) 0.075 % topical cream Apply 1 application topically 3 (three) times daily.      . diclofenac sodium (VOLTAREN) 1 % GEL Apply 1 application topically 4 (four) times daily.  100 g  1  . gabapentin (NEURONTIN) 300 MG capsule Take 2 capsules (600 mg total) by mouth 3 (three) times daily.  180 capsule  3  . hydrocerin (EUCERIN) CREA Apply 1 application topically 2 (two) times daily.  454 g  2  . lisinopril-hydrochlorothiazide (PRINZIDE,ZESTORETIC)  20-12.5 MG per tablet Take 2 tablets by mouth daily.  60 tablet  11  . omeprazole (PRILOSEC) 40 MG capsule TAKE 1 CAPSULE ONCE A DAY  30 capsule  7  . DISCONTD: gabapentin (NEURONTIN) 300 MG capsule Take 2 capsules (600 mg total) by mouth 3 (three) times daily. Take 3 capsules by mouth three times daily.  90 capsule  3   Family History  Problem Relation Age of Onset  . Heart disease Mother   . Cancer Father 65    colon cancer   . Kidney disease Brother     2 of nine brothers on dialysis  . Hypertension Sister   . Anesthesia problems Neg Hx    History   Social History  . Marital Status: Single    Spouse Name: N/A    Number of Children: N/A  . Years of Education: N/A   Social History Main Topics  . Smoking status: Former Smoker -- 0.5 packs/day for 30 years    Types: Cigarettes    Quit date: 11/05/2010  . Smokeless tobacco: None  . Alcohol Use: No  . Drug Use: No  . Sexually Active: None   Other Topics Concern  . None   Social History Narrative  . None  Review of Systems: A full 12 system ROS is negative except as noted in the HPI and A&P.   Objective:  Physical Exam: Filed Vitals:   03/14/12 1045  BP: 135/74  Pulse: 68  Temp: 97.3 F (36.3 C)  TempSrc: Oral  Weight: 292 lb 8 oz (132.677 kg)  SpO2: 100%   Constitutional: Vital signs reviewed.  Patient is a well-developed and well-nourished man in no acute distress and cooperative with exam. Alert and oriented x3.  Head: Normocephalic and atraumatic Ear: TM normal bilaterally Mouth: no erythema or exudates, MMM Eyes: PERRL, EOMI, conjunctivae normal, No scleral icterus.  Neck: Supple, Trachea midline normal ROM, No JVD, mass, thyromegaly, or carotid bruit present.  Cardiovascular: RRR, 3/6 systolic murmur best heard at the RUSB with radiation to the carotids.  S1 normal, S2 normal, no RG, pulses symmetric and intact bilaterally Pulmonary/Chest: CTAB, no wheezes, rales, or rhonchi Abdominal: Soft. Non-tender,  non-distended, bowel sounds are normal, no masses, organomegaly, or guarding present.  GU: no CVA tenderness Musculoskeletal: mild medial compartment swelling noted today.  Positive right sided patellar grind test.  Pain with palpation, passive and active movement.  No pain to palpation in the medial joint line.  No joint deformities, erythema, or stiffness Hematology: no cervical, inginal, or axillary adenopathy.  Neurological: A&O x3, Strength is normal and symmetric bilaterally, cranial nerve II-XII are grossly intact, no focal motor deficit, sensory intact to light touch bilaterally.  Skin: Warm, dry and intact. No rash, cyanosis, or clubbing.  Psychiatric: Normal mood and affect. speech and behavior is normal. Judgment and thought content normal. Cognition and memory are normal.   Assessment & Plan:

## 2012-03-14 NOTE — Patient Instructions (Signed)
1.  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  - The first step is using the pain medication that you have.  It is best to use it before your activity to help control the pain while you are active.  - When you finish your activity, use an ice pack (bag with ice cubes wrapped in a wash cloth can help) for 20 minutes after.    - You can use the ice pack during the day for 20 minutes at a time as needed for the pain.  2.  I will refer you to physical therapy to help with strengthening and getting more flexible in the legs which will help your leg pain.  3.  Follow up with your primary care doctor in about 1-2 months.

## 2012-04-01 ENCOUNTER — Other Ambulatory Visit: Payer: Self-pay | Admitting: Ophthalmology

## 2012-04-01 DIAGNOSIS — H2589 Other age-related cataract: Secondary | ICD-10-CM | POA: Diagnosis not present

## 2012-04-01 NOTE — H&P (Signed)
Pre-operative History and Physical for Ophthalmic Surgery  [name] 04/01/2012                  Chief Complaint: decreased  Vision left eye  Diagnosis:  Combined Cataract  No Known Allergies   Prior to Admission medications   Medication Sig Start Date End Date Taking? Authorizing Provider  amitriptyline (ELAVIL) 25 MG tablet Take 1 tablet (25 mg total) by mouth every morning. 03/14/12 03/14/13  Leodis Sias, MD  amLODipine (NORVASC) 10 MG tablet Take 1 tablet (10 mg total) by mouth daily. 09/01/11   Rocco Serene, MD  aspirin 81 MG EC tablet Take 81 mg by mouth daily.      Historical Provider, MD  capsicum (ZOSTRIX) 0.075 % topical cream Apply 1 application topically 3 (three) times daily. 07/31/11 07/30/12  Duwaine Maxin, MD  diclofenac sodium (VOLTAREN) 1 % GEL Apply 1 application topically 4 (four) times daily. 10/18/11   Zollie Beckers, MD  etodolac (LODINE) 300 MG capsule Take 300 mg by mouth every 8 (eight) hours.    Historical Provider, MD  gabapentin (NEURONTIN) 300 MG capsule Take 2 capsules (600 mg total) by mouth 3 (three) times daily. 03/14/12 03/14/13  Leodis Sias, MD  hydrocerin (EUCERIN) CREA Apply 1 application topically 2 (two) times daily. 07/31/11   Duwaine Maxin, MD  ketorolac (ACULAR) 0.5 % ophthalmic solution Place 1 drop into both eyes Daily. 01/18/12   Historical Provider, MD  lisinopril-hydrochlorothiazide (PRINZIDE,ZESTORETIC) 20-12.5 MG per tablet Take 2 tablets by mouth daily. 07/31/11 07/30/12  Duwaine Maxin, MD  omeprazole (PRILOSEC) 40 MG capsule TAKE 1 CAPSULE ONCE A DAY 12/07/11   Duwaine Maxin, MD    Planned Procedure:                                       Phacoemulsification, Posterior Chamber Intra-ocular Lens Left Eye                                      Acrysof MA50BM + 17.00  Diopter PC IOL for implant  There were no vitals filed for this visit.  Pulse: 76         Temp: NE        Resp:  16   ROS: non-contributory  Past  Medical History  Diagnosis Date  . Hypertension   . Back pain, chronic     MRI 2002, spinal stenosis mild to moderate L2-3 and L3-4, advanced degernerative changes MRI 2010  . Aortic stenosis, mild     1.53 sqcm 9/07, now 2.73 (11/2009) with moderate regurgitation  . Enlarged LA (left atrium)     mild dilatation per echo  . Chest pain, unspecified     neg cath 2003, neg myoview 2008  . Shortness of breath     exertion    Past Surgical History  Procedure Date  . Cardiac catheterization unknown  . Cataract extraction w/phaco 11/08/2011    Procedure: CATARACT EXTRACTION PHACO AND INTRAOCULAR LENS PLACEMENT (IOC);  Surgeon: Shade Flood, MD;  Location: [facility] OR;  Service: Ophthalmology;  Laterality: Right;     History   Social History  . Marital Status: Single    Spouse Name: N/A    Number of Children: N/A  . Years of Education: N/A  Occupational History  . Not on file.   Social History Main Topics  . Smoking status: Former Smoker -- 0.5 packs/day for 30 years    Types: Cigarettes    Quit date: 11/05/2010  . Smokeless tobacco: Not on file  . Alcohol Use: No  . Drug Use: No  . Sexually Active: Not on file   Other Topics Concern  . Not on file   Social History Narrative  . No narrative on file     The following examination is for anesthesia clearance for minimally invasive Ophthalmic surgery. It is primarily to document heart and lung findings and is not intended to elucidate unknown general medical conditions inclusive of abdominal masses, lung lesions, etc.   General Constitution:  within normal limits    Alertness/Orientation:  Person, time place     yes   HEENT:  Eye Findings:  Combined Cataract     left eye  Neck: supple without masses  Chest/Lungs: clear to auscultation  Cardiac: Normal S1 and S2 without Murmur, S3 or S4  Neuro: non-focal  Impression:  Combined Cataract Left Eye  Planned Procedure:  Phacoemulsification, Posterior Chamber Intraocular  Lens    Shade Flood, MD

## 2012-04-05 ENCOUNTER — Ambulatory Visit (INDEPENDENT_AMBULATORY_CARE_PROVIDER_SITE_OTHER): Payer: Medicare Other | Admitting: Internal Medicine

## 2012-04-05 ENCOUNTER — Encounter: Payer: Self-pay | Admitting: Internal Medicine

## 2012-04-05 VITALS — BP 133/77 | HR 69 | Temp 97.2°F | Ht 72.0 in | Wt 300.6 lb

## 2012-04-05 DIAGNOSIS — IMO0002 Reserved for concepts with insufficient information to code with codable children: Secondary | ICD-10-CM

## 2012-04-05 DIAGNOSIS — M1710 Unilateral primary osteoarthritis, unspecified knee: Secondary | ICD-10-CM | POA: Diagnosis not present

## 2012-04-05 DIAGNOSIS — E669 Obesity, unspecified: Secondary | ICD-10-CM | POA: Insufficient documentation

## 2012-04-05 DIAGNOSIS — I119 Hypertensive heart disease without heart failure: Secondary | ICD-10-CM

## 2012-04-05 DIAGNOSIS — M1711 Unilateral primary osteoarthritis, right knee: Secondary | ICD-10-CM

## 2012-04-05 DIAGNOSIS — E538 Deficiency of other specified B group vitamins: Secondary | ICD-10-CM

## 2012-04-05 DIAGNOSIS — M1990 Unspecified osteoarthritis, unspecified site: Secondary | ICD-10-CM

## 2012-04-05 DIAGNOSIS — M48062 Spinal stenosis, lumbar region with neurogenic claudication: Secondary | ICD-10-CM

## 2012-04-05 DIAGNOSIS — I10 Essential (primary) hypertension: Secondary | ICD-10-CM | POA: Diagnosis not present

## 2012-04-05 DIAGNOSIS — K219 Gastro-esophageal reflux disease without esophagitis: Secondary | ICD-10-CM

## 2012-04-05 DIAGNOSIS — IMO0001 Reserved for inherently not codable concepts without codable children: Secondary | ICD-10-CM

## 2012-04-05 DIAGNOSIS — E66811 Obesity, class 1: Secondary | ICD-10-CM

## 2012-04-05 HISTORY — DX: Hypertensive heart disease without heart failure: I11.9

## 2012-04-05 HISTORY — DX: Obesity, unspecified: E66.9

## 2012-04-05 HISTORY — DX: Obesity, class 1: E66.811

## 2012-04-05 MED ORDER — IBUPROFEN 800 MG PO TABS: 800.0000 mg | ORAL_TABLET | Freq: Three times a day (TID) | ORAL | Status: DC | PRN

## 2012-04-05 MED ORDER — CYANOCOBALAMIN 1000 MCG/ML IJ SOLN
1000.0000 ug | INTRAMUSCULAR | Status: DC
  Administered 2012-04-05: 1000 ug via INTRAMUSCULAR

## 2012-04-05 NOTE — Patient Instructions (Addendum)
It was nice to meet you.  I look forward to caring for you for many years.  Stop the etodolac for your knee pain since it does not work.  Start ibuprofen 800 mg by mouth twice a day for knee pain.  Please take the medication with food.  I will see you back in 3 months for a check up of how your knee pain is.

## 2012-04-05 NOTE — Assessment & Plan Note (Signed)
He received a vitamin B12 shot today. At the followup visit we will check a B12 level to see if he has been repleted. If the B12 level is normal we will convert him to oral vitamin B12 therapy. If the B12 level is not normal we will continue with the IM vitamin B12 supplementation.

## 2012-04-05 NOTE — Assessment & Plan Note (Signed)
He was counseled on the need to decrease his oral intake and caloric consumption. We both feel that he is not active enough and this is related to his severe osteoarthritis of the right knee. It is hoped that pharmacologically we can control his arthralgia enough to allow him to become more active and utilize some of the calories that he takes in.

## 2012-04-05 NOTE — Assessment & Plan Note (Signed)
Blood pressure is at goal and the regimen as well tolerated. We will continue the amlodipine, lisinopril, and hydrochlorothiazide.

## 2012-04-05 NOTE — Progress Notes (Signed)
  Subjective:    Patient ID: [name], male    DOB: January 24, 1946, 66 y.o.   MRN: [id number]  HPI  Right medial compartment knee pain: He has had right medial compartment knee pain for several years. He is relatively comfortable at rest but has severe pain when he walks. He also notes occasional left knee give way without locking. BC powder, Tylenol, over-the-counter ibuprofen, and prescription strength he total lack have been ineffective. The pain continues to be a hindrance on his quality of life and prevents him from being active. He is very interested in trying other pharmacologic therapy before even thinking about surgery.  Hypertension: He has been compliant with his lisinopril, hydrochlorothiazide, and amlodipine. He has tolerated these medications well and his blood pressure is currently at target.  Gastroesophageal reflux disease: He has been completely asymptomatic while on the omeprazole. He is tolerating this medication well.  Lumbar stenosis with neurogenic claudication: He continues to have significant numbness in his left lower extremity. There are no dysesthesias with this. He states he's been compliant with his gabapentin and is currently without any complaints.  B12 deficiency: He has been receiving monthly IM vitamin B12 shots. He is tolerated this therapy well. 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  Obesity: His BMI is now 39. He is limited to what he can do from an exertion standpoint because of his right knee osteoarthritis.  Health maintenance: He received a flu shot at the last clinic visit. He is due for Pneumovax and this will be given to him on the return visit.  Review of Systems  Constitutional: Negative for fever, chills, diaphoresis, activity change, appetite change, fatigue and unexpected weight change.  HENT: Negative for facial swelling, neck pain and neck stiffness.   Respiratory: Negative for chest tightness and shortness of  breath.   Cardiovascular: Positive for leg swelling. Negative for chest pain and palpitations.  Gastrointestinal: Negative for nausea, vomiting, abdominal pain and abdominal distention.  Musculoskeletal: Positive for back pain, arthralgias and gait problem. Negative for joint swelling.  Skin: Negative for color change, pallor, rash and wound.  Neurological: Positive for numbness. Negative for dizziness, syncope, weakness and light-headedness.  Psychiatric/Behavioral: Negative for behavioral problems, confusion and decreased concentration. The patient is not nervous/anxious.       Objective:   Physical Exam  Nursing note and vitals reviewed. Constitutional: He is oriented to person, place, and time. He appears well-developed and well-nourished. No distress.  HENT:  Head: Normocephalic and atraumatic.  Pulmonary/Chest: Effort normal.  Musculoskeletal: Normal range of motion. He exhibits edema and tenderness.       Right knee: He exhibits deformity, abnormal alignment and bony tenderness. He exhibits normal range of motion, no swelling, no effusion, no ecchymosis, no laceration, no erythema, no LCL laxity and normal patellar mobility. tenderness found. Medial joint line tenderness noted.       Right lower leg: He exhibits swelling, edema and deformity. He exhibits no tenderness and no bony tenderness.       Legs: Neurological: He is alert and oriented to person, place, and time. A sensory deficit is present.  Skin: Skin is dry. Abrasion noted. No rash noted. He is not diaphoretic. No erythema. No pallor.     Psychiatric: He has a normal mood and affect. His behavior is normal. Judgment and thought content normal.      Assessment & Plan:   Please see problem oriented charting.

## 2012-04-05 NOTE — Assessment & Plan Note (Signed)
Currently asymptomatic on the omeprazole. We will continue with this therapy.

## 2012-04-05 NOTE — Assessment & Plan Note (Signed)
This is his major problem today. He has significant medial joint line tenderness to palpation in the right knee. This is most consistent with osteoarthritis. He has not had any relief with prescription strength etodalac, Tylenol, and over-the-counter ibuprofen. He was willing to try prescription strength ibuprofen 800 mg by mouth 3 times a day as needed for pain. He was asked to take this medication with food. If there is no relief he may be a candidate for salsalate, Naprosyn, or meloxicam. We will reassess his response to the ibuprofen at followup.

## 2012-04-05 NOTE — Assessment & Plan Note (Signed)
His neuropathic pain is well-controlled with the gabapentin. Although he has numbness this is stable. We will continue gabapentin at the current dose.

## 2012-04-24 ENCOUNTER — Encounter (HOSPITAL_COMMUNITY): Payer: Self-pay

## 2012-04-24 ENCOUNTER — Encounter (HOSPITAL_COMMUNITY)
Admission: RE | Admit: 2012-04-24 | Discharge: 2012-04-24 | Disposition: A | Discharge status: Home / Self Care | Payer: Medicare Other | Source: Ambulatory Visit | Attending: Ophthalmology | Admitting: Ophthalmology

## 2012-04-24 DIAGNOSIS — Z01812 Encounter for preprocedural laboratory examination: Secondary | ICD-10-CM | POA: Diagnosis not present

## 2012-04-24 DIAGNOSIS — I10 Essential (primary) hypertension: Secondary | ICD-10-CM | POA: Diagnosis not present

## 2012-04-24 DIAGNOSIS — K219 Gastro-esophageal reflux disease without esophagitis: Secondary | ICD-10-CM | POA: Diagnosis not present

## 2012-04-24 DIAGNOSIS — I359 Nonrheumatic aortic valve disorder, unspecified: Secondary | ICD-10-CM | POA: Diagnosis not present

## 2012-04-24 DIAGNOSIS — H2510 Age-related nuclear cataract, unspecified eye: Secondary | ICD-10-CM | POA: Diagnosis not present

## 2012-04-24 LAB — BASIC METABOLIC PANEL
BUN: 12 mg/dL (ref 6–23)
Creatinine, Ser: 1.1 mg/dL (ref 0.50–1.35)
GFR calc Af Amer: 78 mL/min — ABNORMAL LOW (ref >90)
GFR calc non Af Amer: 67 mL/min — ABNORMAL LOW (ref >90)
Glucose, Bld: 156 mg/dL — ABNORMAL HIGH (ref 70–99)

## 2012-04-24 LAB — CBC
Hemoglobin: 13.7 g/dL (ref 13.0–17.0)
MCH: 30.2 pg (ref 26.0–34.0)
MCHC: 33.7 g/dL (ref 30.0–36.0)
RDW: 15.9 % — ABNORMAL HIGH (ref 11.5–15.5)

## 2012-04-24 NOTE — Progress Notes (Signed)
CALL TO DR GEIGER . REGARDING [name] SCHEDULED FOR CAT EXT 05/03/12. LEFT MESSAGE FOR DR TO CALL BACK IF NEEDED . TO [facility] TOMORROW ... [phone] . I FEEL THE PATIENT IS NOT GOING TO HAVE AN ADULT TO STAY WITH HIM X 24 HOURS POST OP.  HE STATED HIS NEPHEW WOULD TAKE HIM HOME . AND HE HAS AN AIDE BUT NO ONE FOR THE 24 HOUR PERIOD . LEFT THIS MESSAGE AND CALL BACK NUMBER ON DR Lovena Le PHONE [phone]

## 2012-04-24 NOTE — Pre-Procedure Instructions (Signed)
20 JAMIR RONE  04/24/2012   Your procedure is scheduled on:  November 06,2013 Wednesday  Report to [facility] at 0830 AM.  Call this number if you have problems the morning of surgery: [phone]   Remember:   Do not eat food or drink liquids:After Midnight. .  Take these medicines the morning of surgery with A SIP OF WATER: norvasc(amlodipine)   neurontin  prilosec   Do not wear jewelry, make-up or nail polish.  Do not wear lotions, powders, or perfumes. You may wear deodorant.  Do not shave 48 hours prior to surgery. Men may shave face and neck.  Do not bring valuables to the hospital.  Contacts, dentures or bridgework may not be worn into surgery.  Leave suitcase in the car. After surgery it may be brought to your room.  For patients admitted to the hospital, checkout time is 11:00 AM the day of discharge.   Patients discharged the day of surgery will not be allowed to drive home.  Name and phone number of your driver:   Special Instructions: Shower using CHG 2 nights before surgery and the night before surgery.  If you shower the day of surgery use CHG.  Use special wash - you have one bottle of CHG for all showers.  You should use approximately 1/3 of the bottle for each shower.   Please read over the following fact sheets that you were given: Pain Booklet, Coughing and Deep Breathing, Lab Information, MRSA Information and Surgical Site Infection Prevention

## 2012-04-24 NOTE — Progress Notes (Signed)
THIS PT CANNOT READ NOR WRITE.. I HAVE TOLD HIM HE MUST HAVE AN ADULT WITH HIM FOR 24 HOURS AFTER THE ANESTHESIA .Marland KitchenOR PERHAPS WILL NEED TO STAY OVERNIGHT IN THE HOSPITAL .[name] HE SEEMED TO UNDERSTAND .[name] STATES HIS NEPHEW WILL BRING HIM  HERE THE DOS.

## 2012-04-30 MED ORDER — GATIFLOXACIN 0.5 % OP SOLN
1.0000 [drp] | OPHTHALMIC | Status: AC | PRN
  Administered 2012-05-01 (×3): 1 [drp] via OPHTHALMIC
  Filled 2012-04-30: qty 2.5

## 2012-04-30 MED ORDER — TETRACAINE HCL 0.5 % OP SOLN
2.0000 [drp] | OPHTHALMIC | Status: AC
  Administered 2012-05-01: 2 [drp] via OPHTHALMIC
  Filled 2012-04-30: qty 2

## 2012-04-30 MED ORDER — PREDNISOLONE ACETATE 1 % OP SUSP
1.0000 [drp] | OPHTHALMIC | Status: AC
  Administered 2012-05-01: 1 [drp] via OPHTHALMIC
  Filled 2012-04-30: qty 5

## 2012-04-30 MED ORDER — PHENYLEPHRINE HCL 2.5 % OP SOLN
1.0000 [drp] | OPHTHALMIC | Status: AC | PRN
  Administered 2012-05-01 (×3): 1 [drp] via OPHTHALMIC
  Filled 2012-04-30: qty 3

## 2012-05-01 ENCOUNTER — Encounter (HOSPITAL_COMMUNITY): Payer: Self-pay | Admitting: *Deleted

## 2012-05-01 ENCOUNTER — Ambulatory Visit (HOSPITAL_COMMUNITY): Payer: Medicare Other | Admitting: Anesthesiology

## 2012-05-01 ENCOUNTER — Encounter (HOSPITAL_COMMUNITY): Payer: Self-pay | Admitting: Anesthesiology

## 2012-05-01 ENCOUNTER — Observation Stay (HOSPITAL_COMMUNITY)
Admission: RE | Admit: 2012-05-01 | Discharge: 2012-05-02 | Disposition: A | Discharge status: Home / Self Care | Payer: Medicare Other | Source: Ambulatory Visit | Attending: Ophthalmology | Admitting: Ophthalmology

## 2012-05-01 ENCOUNTER — Encounter (HOSPITAL_COMMUNITY)
Admission: RE | Disposition: A | Discharge status: Home / Self Care | Payer: Self-pay | Source: Ambulatory Visit | Attending: Ophthalmology

## 2012-05-01 DIAGNOSIS — R0602 Shortness of breath: Secondary | ICD-10-CM | POA: Diagnosis not present

## 2012-05-01 DIAGNOSIS — K219 Gastro-esophageal reflux disease without esophagitis: Secondary | ICD-10-CM | POA: Insufficient documentation

## 2012-05-01 DIAGNOSIS — I10 Essential (primary) hypertension: Secondary | ICD-10-CM | POA: Insufficient documentation

## 2012-05-01 DIAGNOSIS — Z01812 Encounter for preprocedural laboratory examination: Secondary | ICD-10-CM | POA: Insufficient documentation

## 2012-05-01 DIAGNOSIS — H2510 Age-related nuclear cataract, unspecified eye: Principal | ICD-10-CM | POA: Insufficient documentation

## 2012-05-01 DIAGNOSIS — H2589 Other age-related cataract: Secondary | ICD-10-CM | POA: Diagnosis not present

## 2012-05-01 DIAGNOSIS — H547 Unspecified visual loss: Secondary | ICD-10-CM | POA: Diagnosis not present

## 2012-05-01 DIAGNOSIS — I359 Nonrheumatic aortic valve disorder, unspecified: Secondary | ICD-10-CM | POA: Insufficient documentation

## 2012-05-01 DIAGNOSIS — R51 Headache: Secondary | ICD-10-CM | POA: Diagnosis not present

## 2012-05-01 HISTORY — PX: CATARACT EXTRACTION: SUR2

## 2012-05-01 HISTORY — PX: CATARACT EXTRACTION W/PHACO: SHX586

## 2012-05-01 SURGERY — PHACOEMULSIFICATION, CATARACT, WITH IOL INSERTION
Anesthesia: Monitor Anesthesia Care | Site: Eye | Laterality: Left | Wound class: Clean

## 2012-05-01 MED ORDER — LIDOCAINE HCL 2 % IJ SOLN
INTRAMUSCULAR | Status: AC
  Filled 2012-05-01: qty 20

## 2012-05-01 MED ORDER — TRAMADOL HCL 50 MG PO TABS
50.0000 mg | ORAL_TABLET | ORAL | Status: DC | PRN
  Administered 2012-05-02: 50 mg via ORAL
  Filled 2012-05-01: qty 1

## 2012-05-01 MED ORDER — NA CHONDROIT SULF-NA HYALURON 40-30 MG/ML IO SOLN
INTRAOCULAR | Status: DC | PRN
  Administered 2012-05-01: 0.5 mL via INTRAOCULAR

## 2012-05-01 MED ORDER — PROVISC 10 MG/ML IO SOLN
INTRAOCULAR | Status: DC | PRN
  Administered 2012-05-01: 8.5 mg via INTRAOCULAR

## 2012-05-01 MED ORDER — HYPROMELLOSE (GONIOSCOPIC) 2.5 % OP SOLN
OPHTHALMIC | Status: AC
  Filled 2012-05-01: qty 15

## 2012-05-01 MED ORDER — SODIUM CHLORIDE 0.9 % IV SOLN
INTRAVENOUS | Status: DC
  Administered 2012-05-02: 06:00:00 via INTRAVENOUS

## 2012-05-01 MED ORDER — PROPOFOL 10 MG/ML IV BOLUS
INTRAVENOUS | Status: DC | PRN
  Administered 2012-05-01: 70 mg via INTRAVENOUS

## 2012-05-01 MED ORDER — BSS IO SOLN
INTRAOCULAR | Status: AC
  Filled 2012-05-01: qty 500

## 2012-05-01 MED ORDER — DEXAMETHASONE SODIUM PHOSPHATE 10 MG/ML IJ SOLN
INTRAMUSCULAR | Status: DC | PRN
  Administered 2012-05-01: 10 mg

## 2012-05-01 MED ORDER — OXYCODONE HCL 5 MG/5ML PO SOLN: 5.0000 mg | Freq: Once | ORAL | Status: AC | PRN

## 2012-05-01 MED ORDER — TETRACAINE HCL 0.5 % OP SOLN
OPHTHALMIC | Status: AC
  Filled 2012-05-01: qty 2

## 2012-05-01 MED ORDER — SODIUM CHLORIDE 0.9 % IV SOLN
INTRAVENOUS | Status: DC
  Administered 2012-05-01: 35 mL/h via INTRAVENOUS

## 2012-05-01 MED ORDER — FENTANYL CITRATE 0.05 MG/ML IJ SOLN: 25.0000 ug | INTRAMUSCULAR | Status: DC | PRN

## 2012-05-01 MED ORDER — LIDOCAINE HCL (PF) 2 % IJ SOLN
INTRAMUSCULAR | Status: DC | PRN
  Administered 2012-05-01: 10 mL

## 2012-05-01 MED ORDER — BUPIVACAINE HCL (PF) 0.75 % IJ SOLN
INTRAMUSCULAR | Status: DC | PRN
  Administered 2012-05-01: 10 mL

## 2012-05-01 MED ORDER — MEPERIDINE HCL 25 MG/ML IJ SOLN: 6.2500 mg | INTRAMUSCULAR | Status: DC | PRN

## 2012-05-01 MED ORDER — CEFAZOLIN SUBCONJUNCTIVAL INJECTION 100 MG/0.5 ML
200.0000 mg | INJECTION | SUBCONJUNCTIVAL | Status: AC
  Administered 2012-05-01: 100 mg via SUBCONJUNCTIVAL
  Filled 2012-05-01: qty 1

## 2012-05-01 MED ORDER — DEXAMETHASONE SODIUM PHOSPHATE 10 MG/ML IJ SOLN
INTRAMUSCULAR | Status: AC
  Filled 2012-05-01: qty 1

## 2012-05-01 MED ORDER — SODIUM HYALURONATE 10 MG/ML IO SOLN
INTRAOCULAR | Status: AC
  Filled 2012-05-01: qty 0.85

## 2012-05-01 MED ORDER — BACITRACIN-POLYMYXIN B 500-10000 UNIT/GM OP OINT
TOPICAL_OINTMENT | OPHTHALMIC | Status: DC | PRN
  Administered 2012-05-01: 1 via OPHTHALMIC

## 2012-05-01 MED ORDER — MIDAZOLAM HCL 2 MG/2ML IJ SOLN: 0.5000 mg | Freq: Once | INTRAMUSCULAR | Status: AC | PRN

## 2012-05-01 MED ORDER — FENTANYL CITRATE 0.05 MG/ML IJ SOLN
INTRAMUSCULAR | Status: DC | PRN
  Administered 2012-05-01 (×2): 25 ug via INTRAVENOUS

## 2012-05-01 MED ORDER — EPINEPHRINE HCL 1 MG/ML IJ SOLN
INTRAMUSCULAR | Status: AC
  Filled 2012-05-01: qty 1

## 2012-05-01 MED ORDER — PROMETHAZINE HCL 25 MG/ML IJ SOLN: 6.2500 mg | INTRAMUSCULAR | Status: DC | PRN

## 2012-05-01 MED ORDER — OXYCODONE HCL 5 MG PO TABS: 5.0000 mg | ORAL_TABLET | Freq: Once | ORAL | Status: AC | PRN

## 2012-05-01 MED ORDER — TRIAMCINOLONE ACETONIDE 40 MG/ML IJ SUSP
INTRAMUSCULAR | Status: AC
  Filled 2012-05-01: qty 1

## 2012-05-01 MED ORDER — BACITRACIN-POLYMYXIN B 500-10000 UNIT/GM OP OINT
TOPICAL_OINTMENT | OPHTHALMIC | Status: AC
  Filled 2012-05-01: qty 3.5

## 2012-05-01 MED ORDER — NA CHONDROIT SULF-NA HYALURON 40-30 MG/ML IO SOLN
INTRAOCULAR | Status: AC
  Filled 2012-05-01: qty 1

## 2012-05-01 MED ORDER — HYPROMELLOSE (GONIOSCOPIC) 2.5 % OP SOLN
OPHTHALMIC | Status: DC | PRN
  Administered 2012-05-01: 1 [drp] via OPHTHALMIC

## 2012-05-01 MED ORDER — EPINEPHRINE HCL 1 MG/ML IJ SOLN
INTRAOCULAR | Status: DC | PRN
  Administered 2012-05-01: 14:00:00

## 2012-05-01 MED ORDER — ACETAZOLAMIDE SODIUM 500 MG IJ SOLR
INTRAMUSCULAR | Status: AC
  Filled 2012-05-01: qty 500

## 2012-05-01 MED ORDER — SODIUM CHLORIDE 0.9 % IV SOLN
INTRAVENOUS | Status: DC | PRN
  Administered 2012-05-01 (×2): via INTRAVENOUS

## 2012-05-01 SURGICAL SUPPLY — 63 items
APL SRG 3 HI ABS STRL LF PLS (MISCELLANEOUS) ×1
APPLICATOR COTTON TIP 6IN STRL (MISCELLANEOUS) ×2 IMPLANT
APPLICATOR DR MATTHEWS STRL (MISCELLANEOUS) ×2 IMPLANT
BAG FLD CLT MN 6.25X3.5 (WOUND CARE) ×1
BAG MINI COLL DRAIN (WOUND CARE) ×2 IMPLANT
BLADE EYE MINI 60D BEAVER (BLADE) IMPLANT
BLADE KERATOME 2.75 (BLADE) ×2 IMPLANT
BLADE STAB KNIFE 15DEG (BLADE) ×1 IMPLANT
CANNULA ANTERIOR CHAMBER 27GA (MISCELLANEOUS) IMPLANT
CLOTH BEACON ORANGE TIMEOUT ST (SAFETY) ×2 IMPLANT
DRAPE OPHTHALMIC 77X100 STRL (CUSTOM PROCEDURE TRAY) ×2 IMPLANT
DRAPE POUCH INSTRU U-SHP 10X18 (DRAPES) ×2 IMPLANT
DRSG TEGADERM 4X4.75 (GAUZE/BANDAGES/DRESSINGS) ×2 IMPLANT
FILTER BLUE MILLIPORE (MISCELLANEOUS) IMPLANT
GLOVE SS BIOGEL STRL SZ 6.5 (GLOVE) ×1 IMPLANT
GLOVE SUPERSENSE BIOGEL SZ 6.5 (GLOVE) ×1
GLOVE SURG SS PI 7.0 STRL IVOR (GLOVE) ×1 IMPLANT
GOWN SRG XL XLNG 56XLVL 4 (GOWN DISPOSABLE) ×1 IMPLANT
GOWN STRL NON-REIN LRG LVL3 (GOWN DISPOSABLE) ×2 IMPLANT
GOWN STRL NON-REIN XL XLG LVL4 (GOWN DISPOSABLE) ×2
KIT BASIN OR (CUSTOM PROCEDURE TRAY) ×2 IMPLANT
KIT ROOM TURNOVER OR (KITS) IMPLANT
KNIFE GRIESHABER SHARP 2.5MM (MISCELLANEOUS) ×2 IMPLANT
LENS IOL ACRYSOF MP POST 17.0 (Intraocular Lens) ×1 IMPLANT
MASK EYE SHIELD (GAUZE/BANDAGES/DRESSINGS) ×1 IMPLANT
NDL 18GX1X1/2 (RX/OR ONLY) (NEEDLE) IMPLANT
NDL 25GX 5/8IN NON SAFETY (NEEDLE) ×1 IMPLANT
NDL FILTER BLUNT 18X1 1/2 (NEEDLE) IMPLANT
NDL HYPO 30X.5 LL (NEEDLE) ×2 IMPLANT
NEEDLE 18GX1X1/2 (RX/OR ONLY) (NEEDLE) ×2 IMPLANT
NEEDLE 22X1 1/2 (OR ONLY) (NEEDLE) ×2 IMPLANT
NEEDLE 25GX 5/8IN NON SAFETY (NEEDLE) ×2 IMPLANT
NEEDLE FILTER BLUNT 18X 1/2SAF (NEEDLE) ×1
NEEDLE FILTER BLUNT 18X1 1/2 (NEEDLE) ×1 IMPLANT
NEEDLE HYPO 30X.5 LL (NEEDLE) ×4 IMPLANT
NS IRRIG 1000ML POUR BTL (IV SOLUTION) ×2 IMPLANT
PACK CATARACT CUSTOM (CUSTOM PROCEDURE TRAY) ×2 IMPLANT
PACK CATARACT MCHSCP (PACKS) ×2 IMPLANT
PACK COMBINED CATERACT/VIT 23G (OPHTHALMIC RELATED) IMPLANT
PAD ARMBOARD 7.5X6 YLW CONV (MISCELLANEOUS) ×4 IMPLANT
PAD EYE OVAL STERILE LF (GAUZE/BANDAGES/DRESSINGS) ×1 IMPLANT
PHACO TIP KELMAN 45DEG (TIP) ×2 IMPLANT
PROBE ANTERIOR 20G W/INFUS NDL (MISCELLANEOUS) IMPLANT
RING MALYGIN (MISCELLANEOUS) IMPLANT
ROLLS DENTAL (MISCELLANEOUS) IMPLANT
SHUTTLE MONARCH TYPE A (NEEDLE) ×2 IMPLANT
SOLUTION ANTI FOG 6CC (MISCELLANEOUS) ×2 IMPLANT
SPEAR EYE SURG WECK-CEL (MISCELLANEOUS) ×1 IMPLANT
SUT ETHILON 10-0 CS-B-6CS-B-6 (SUTURE)
SUT ETHILON 5 0 P 3 18 (SUTURE)
SUT ETHILON 9 0 TG140 8 (SUTURE) IMPLANT
SUT NYLON ETHILON 5-0 P-3 1X18 (SUTURE) IMPLANT
SUT PLAIN 6 0 TG1408 (SUTURE) IMPLANT
SUT POLY NON ABSORB 10-0 8 STR (SUTURE) IMPLANT
SUT VICRYL 6 0 S 29 12 (SUTURE) IMPLANT
SUTURE EHLN 10-0 CS-B-6CS-B-6 (SUTURE) IMPLANT
SYR 20CC LL (SYRINGE) IMPLANT
SYR 5ML LL (SYRINGE) IMPLANT
SYR TB 1ML LUER SLIP (SYRINGE) IMPLANT
SYRINGE 10CC LL (SYRINGE) IMPLANT
TOWEL OR 17X24 6PK STRL BLUE (TOWEL DISPOSABLE) ×4 IMPLANT
WATER STERILE IRR 1000ML POUR (IV SOLUTION) ×2 IMPLANT
WIPE INSTRUMENT VISIWIPE 73X73 (MISCELLANEOUS) ×2 IMPLANT

## 2012-05-01 NOTE — H&P (View-Only) (Signed)
Pre-operative History and Physical for Ophthalmic Surgery  [name] 04/01/2012                  Chief Complaint: decreased  Vision left eye  Diagnosis:  Combined Cataract  No Known Allergies   Prior to Admission medications   Medication Sig Start Date End Date Taking? Authorizing Provider  amitriptyline (ELAVIL) 25 MG tablet Take 1 tablet (25 mg total) by mouth every morning. 03/14/12 03/14/13  Christopher Pribula, MD  amLODipine (NORVASC) 10 MG tablet Take 1 tablet (10 mg total) by mouth daily. 09/01/11   Lawrence D Klima, MD  aspirin 81 MG EC tablet Take 81 mg by mouth daily.      Historical Provider, MD  capsicum (ZOSTRIX) 0.075 % topical cream Apply 1 application topically 3 (three) times daily. 07/31/11 07/30/12  Michael C Raisch, MD  diclofenac sodium (VOLTAREN) 1 % GEL Apply 1 application topically 4 (four) times daily. 10/18/11   Madhav V Devani, MD  etodolac (LODINE) 300 MG capsule Take 300 mg by mouth every 8 (eight) hours.    Historical Provider, MD  gabapentin (NEURONTIN) 300 MG capsule Take 2 capsules (600 mg total) by mouth 3 (three) times daily. 03/14/12 03/14/13  Christopher Pribula, MD  hydrocerin (EUCERIN) CREA Apply 1 application topically 2 (two) times daily. 07/31/11   Michael C Raisch, MD  ketorolac (ACULAR) 0.5 % ophthalmic solution Place 1 drop into both eyes Daily. 01/18/12   Historical Provider, MD  lisinopril-hydrochlorothiazide (PRINZIDE,ZESTORETIC) 20-12.5 MG per tablet Take 2 tablets by mouth daily. 07/31/11 07/30/12  Michael C Raisch, MD  omeprazole (PRILOSEC) 40 MG capsule TAKE 1 CAPSULE ONCE A DAY 12/07/11   Michael C Raisch, MD    Planned Procedure:                                       Phacoemulsification, Posterior Chamber Intra-ocular Lens Left Eye                                      Acrysof MA50BM + 17.00  Diopter PC IOL for implant  There were no vitals filed for this visit.  Pulse: 76         Temp: NE        Resp:  16   ROS: non-contributory  Past  Medical History  Diagnosis Date  . Hypertension   . Back pain, chronic     MRI 2002, spinal stenosis mild to moderate L2-3 and L3-4, advanced degernerative changes MRI 2010  . Aortic stenosis, mild     1.53 sqcm 9/07, now 2.73 (11/2009) with moderate regurgitation  . Enlarged LA (left atrium)     mild dilatation per echo  . Chest pain, unspecified     neg cath 2003, neg myoview 2008  . Shortness of breath     exertion    Past Surgical History  Procedure Date  . Cardiac catheterization unknown  . Cataract extraction w/phaco 11/08/2011    Procedure: CATARACT EXTRACTION PHACO AND INTRAOCULAR LENS PLACEMENT (IOC);  Surgeon: Loye Vento, MD;  Location: [facility] OR;  Service: Ophthalmology;  Laterality: Right;     History   Social History  . Marital Status: Single    Spouse Name: N/A    Number of Children: N/A  . Years of Education: N/A     Occupational History  . Not on file.   Social History Main Topics  . Smoking status: Former Smoker -- 0.5 packs/day for 30 years    Types: Cigarettes    Quit date: 11/05/2010  . Smokeless tobacco: Not on file  . Alcohol Use: No  . Drug Use: No  . Sexually Active: Not on file   Other Topics Concern  . Not on file   Social History Narrative  . No narrative on file     The following examination is for anesthesia clearance for minimally invasive Ophthalmic surgery. It is primarily to document heart and lung findings and is not intended to elucidate unknown general medical conditions inclusive of abdominal masses, lung lesions, etc.   General Constitution:  within normal limits    Alertness/Orientation:  Person, time place     yes   HEENT:  Eye Findings:  Combined Cataract     left eye  Neck: supple without masses  Chest/Lungs: clear to auscultation  Cardiac: Normal S1 and S2 without Murmur, S3 or S4  Neuro: non-focal  Impression:  Combined Cataract Left Eye  Planned Procedure:  Phacoemulsification, Posterior Chamber Intraocular  Lens    Shawn Faulkenberry, MD        

## 2012-05-01 NOTE — Progress Notes (Signed)
Patient does not have ride home nor anyone to stay with him for next 24 hours. Patient states he does not feel comfortable going home without someone there with him.  Dr. [name] notified and orders to admit patient overnight were given. Will call report when bed available. Nickolas Madrid

## 2012-05-01 NOTE — Anesthesia Procedure Notes (Signed)
Procedure Name: MAC Date/Time: 05/01/2012 1:47 PM Performed by: Jerilee Hoh Pre-anesthesia Checklist: Patient identified, Emergency Drugs available, Suction available and Patient being monitored Patient Re-evaluated:Patient Re-evaluated prior to inductionOxygen Delivery Method: Nasal cannula Intubation Type: IV induction Placement Confirmation: positive ETCO2 and breath sounds checked- equal and bilateral

## 2012-05-01 NOTE — Transfer of Care (Signed)
Immediate Anesthesia Transfer of Care Note  Patient: [name]  Procedure(s) Performed: Procedure(s) (LRB) with comments: CATARACT EXTRACTION PHACO AND INTRAOCULAR LENS PLACEMENT (IOC) (Left)  Patient Location: Short Stay  Anesthesia Type:MAC  Level of Consciousness: awake, alert , oriented and patient cooperative  Airway & Oxygen Therapy: Patient Spontanous Breathing  Post-op Assessment: Report given to PACU RN, Post -op Vital signs reviewed and stable and Patient moving all extremities  Post vital signs: Reviewed and stable  Complications: No apparent anesthesia complications

## 2012-05-01 NOTE — Progress Notes (Signed)
Report called to [facility], all questions answered. Patient taken via wheelchair to room 22 without difficulty.

## 2012-05-01 NOTE — Anesthesia Postprocedure Evaluation (Signed)
  Anesthesia Post-op Note  Patient: [name]  Procedure(s) Performed: Procedure(s) (LRB) with comments: CATARACT EXTRACTION PHACO AND INTRAOCULAR LENS PLACEMENT (IOC) (Left)  Patient Location: Short Stay  Anesthesia Type:MAC  Level of Consciousness: awake, alert , oriented and patient cooperative  Airway and Oxygen Therapy: Patient Spontanous Breathing  Post-op Pain: none  Post-op Assessment: Post-op Vital signs reviewed, Patient's Cardiovascular Status Stable, Respiratory Function Stable, Patent Airway, No signs of Nausea or vomiting and Adequate PO intake  Post-op Vital Signs: Reviewed and stable  Complications: No apparent anesthesia complications

## 2012-05-01 NOTE — Anesthesia Preprocedure Evaluation (Signed)
Anesthesia Evaluation  Patient identified by MRN, date of birth, ID band Patient awake    Reviewed: Allergy & Precautions, H&P , NPO status , Patient's Chart, lab work & pertinent test results  History of Anesthesia Complications Negative for: history of anesthetic complications  Airway Mallampati: II TM Distance: >3 FB Neck ROM: Full    Dental  (+) Edentulous Upper, Poor Dentition, Missing and Dental Advisory Given   Pulmonary shortness of breath, neg pneumonia -, former smoker (quit 2 year),  breath sounds clear to auscultation  Pulmonary exam normal       Cardiovascular hypertension, Rhythm:Regular Rate:Normal     Neuro/Psych    GI/Hepatic Neg liver ROS, GERD-  Medicated and Controlled,  Endo/Other  Morbid obesity  Renal/GU negative Renal ROS     Musculoskeletal   Abdominal (+) + obese,   Peds  Hematology   Anesthesia Other Findings   Reproductive/Obstetrics                           Anesthesia Physical Anesthesia Plan  ASA: II  Anesthesia Plan: MAC   Post-op Pain Management:    Induction: Intravenous  Airway Management Planned: Nasal Cannula  Additional Equipment:   Intra-op Plan:   Post-operative Plan:   Informed Consent:   Dental advisory given  Plan Discussed with: CRNA and Surgeon  Anesthesia Plan Comments: (Plan routine monitors, MAC with eye block by Dr. [name])        Anesthesia Quick Evaluation

## 2012-05-01 NOTE — Interval H&P Note (Signed)
History and Physical Interval Note:  05/01/2012 12:59 PM  [name]  has presented today for surgery, with the diagnosis of combined cataract left eye  The various methods of treatment have been discussed with the patient and family. After consideration of risks, benefits and other options for treatment, the patient has consented to  Procedure(s) (LRB) with comments: CATARACT EXTRACTION PHACO AND INTRAOCULAR LENS PLACEMENT (IOC) (Left) - DR.Tyjae Shvartsman WOULDN LIKE TO START EARLIER IF POSSIBLE as a surgical intervention .  The patient's history has been reviewed, patient examined, no change in status, stable for surgery.  I have reviewed the patient's chart and labs.  Questions were answered to the patient's satisfaction.     Shade Flood, MD

## 2012-05-01 NOTE — Preoperative (Signed)
Beta Blockers   Reason not to administer Beta Blockers:Not Applicable 

## 2012-05-01 NOTE — Op Note (Signed)
[name] 05/01/2012 Cataract: Combined, Nuclear  Procedure: Phacoemulsification, Posterior Chamber Intra-ocular Lens Operative Eye:  left eye  Surgeon: Shade Flood Estimated Blood Loss: minimal Specimens for Pathology:  None Complications: none  The patient was prepared and draped in the usual manner for ocular surgery on the left eye. A Cook lid speculum was placed. A peripheral clear corneal incision was made at the surgical limbus centered at the 11:00 meridian. A separate clear corneal stab incision was made with a 15 degree blade at the 2:00 meridian to permit bi-manual technique. Viscoat and  Provisc as an underlying layer next to the capsule was instilled into the anterior chamber through that incision.  A keratome was used to create a self sealing incision entering the anterior chamber at the 11:00 meridian. A capsulorhexis was performed using a bent 25g needle. The lens was hydrodissected and the nucleus was hydrodilineated using a Nichammin cannula. The Chang chopper was inserted and used to rotate the lens to insure adequate lens mobility. The phacoemulsification handpiece was inserted and a combined phaco-chop technique was employed, fracturing the lens into separate sections with subsequent removal with the phaco handpiece.   The I/A cannula was used to remove remaining lens cortex. Provisc was instilled and used to deepen the anterior chamber and posterior capsule bag. The Monarch injector was used to place a folded Acrysof MA50BM PC IOL, + 19.00  diopters, into the capsule bag. A Sinskey lens hook was used to dial in the trailing haptic.  The I/A cannula was used to remove the viscoelastic from the anterior chamber. BSS was used to bring IOP to the desired range and the wound was checked to insure it was watertight. Subconjunctival injections of Ancef 100/0.95ml and Dexamethasone 0.5 ml of a 10mg /89ml solution were placed without complication. The lid speculum and drapes were  removed and the patient's eye was patched with Polymixin/Bacitracin ophthalmic ointment. An eye shield was placed and the patient was transferred alert and conversant from the operating room to the post-operative recovery area.   Shade Flood, MD

## 2012-05-01 NOTE — Progress Notes (Signed)
Pt states today that there is no one to drive him home as well as no one to stay with him.  (See Julia's previous note)

## 2012-05-02 ENCOUNTER — Encounter (HOSPITAL_COMMUNITY): Payer: Self-pay | Admitting: General Practice

## 2012-05-02 NOTE — Care Management Note (Signed)
  Page 1 of 1   05/02/2012     11:49:15 AM   CARE MANAGEMENT NOTE 05/02/2012  Patient:  [name], [name]   Account Number:  [id number]  Date Initiated:  05/02/2012  Documentation initiated by:  Ronny Flurry  Subjective/Objective Assessment:     Action/Plan:   Anticipated DC Date:     Anticipated DC Plan:           Choice offered to / List presented to:             Status of service:   Medicare Important Message given?   (If response is "NO", the following Medicare IM given date fields will be blank) Date Medicare IM given:   Date Additional Medicare IM given:    Discharge Disposition:    Per UR Regulation:    If discussed at Long Length of Stay Meetings, dates discussed:    Comments:  05-02-12 Patient lives in an Assisted Living , he is unsure of the name of the Assisted Living .  He has an aide  Geanie Cooley .  Spoke with daughter [name] [phone], patient lives at [facility] , [address]  [phone]. [name] will be here at 1400 today

## 2012-05-02 NOTE — Progress Notes (Signed)
Discharge instructions reviewed with pt and family.  Pt and family verbalized understanding and had no questions.  Pt discharged in stable condition via wheelchair with family.  Hector Shade [place]

## 2012-05-02 NOTE — Discharge Summary (Signed)
DISCHARGE SUMMARY NAHIEM DREDGE  05/02/2012  Post-op Cataract, kept in observation status as he would not have someone to stay with him at home per hospital policy.   Jordie, [place]  Home Medication Instructions ZOX:[id number]   Printed on:05/02/12 1510  Medication Information                    aspirin 81 MG EC tablet Take 81 mg by mouth daily.             lisinopril-hydrochlorothiazide (PRINZIDE,ZESTORETIC) 20-12.5 MG per tablet Take 2 tablets by mouth daily.           amLODipine (NORVASC) 10 MG tablet Take 1 tablet (10 mg total) by mouth daily.           capsicum (ZOSTRIX) 0.075 % topical cream Apply 1 application topically 3 (three) times daily as needed. For pain           ketorolac (ACULAR) 0.5 % ophthalmic solution Place 1 drop into both eyes Daily.           omeprazole (PRILOSEC) 40 MG capsule Take 40 mg by mouth daily.           ibuprofen (ADVIL,MOTRIN) 800 MG tablet Take 800 mg by mouth every 8 (eight) hours as needed. For pain Take with food           cyanocobalamin (,VITAMIN B-12,) 1000 MCG/ML injection Inject 1,000 mcg into the muscle every 30 (thirty) days.           diclofenac sodium (VOLTAREN) 1 % GEL Apply 1 application topically daily as needed. For pain           amitriptyline (ELAVIL) 25 MG tablet Take 25 mg by mouth at bedtime.           gabapentin (NEURONTIN) 300 MG capsule Take 600 mg by mouth 3 (three) times daily.             Follow-up Information    Follow up with Shade Flood, MD. In 1 day. (4:00 PM)    Contact information:   [address]  [place], [place] [phone]        4:00PM 05/02/2012  Dezaray Shibuya, GREERMD

## 2012-05-03 ENCOUNTER — Other Ambulatory Visit: Payer: Self-pay | Admitting: *Deleted

## 2012-05-03 ENCOUNTER — Encounter (HOSPITAL_COMMUNITY): Payer: Self-pay | Admitting: Ophthalmology

## 2012-05-03 DIAGNOSIS — M48062 Spinal stenosis, lumbar region with neurogenic claudication: Secondary | ICD-10-CM

## 2012-05-06 ENCOUNTER — Ambulatory Visit (INDEPENDENT_AMBULATORY_CARE_PROVIDER_SITE_OTHER): Payer: Medicare Other | Admitting: *Deleted

## 2012-05-06 DIAGNOSIS — E538 Deficiency of other specified B group vitamins: Secondary | ICD-10-CM

## 2012-05-06 MED ORDER — GABAPENTIN 300 MG PO CAPS: 600.0000 mg | ORAL_CAPSULE | Freq: Three times a day (TID) | ORAL | Status: DC

## 2012-05-06 MED ORDER — CYANOCOBALAMIN 1000 MCG/ML IJ SOLN
1000.0000 ug | Freq: Once | INTRAMUSCULAR | Status: AC
  Administered 2012-05-06: 1000 ug via INTRAMUSCULAR

## 2012-06-04 ENCOUNTER — Other Ambulatory Visit: Payer: Self-pay | Admitting: *Deleted

## 2012-06-04 DIAGNOSIS — I10 Essential (primary) hypertension: Secondary | ICD-10-CM

## 2012-06-04 DIAGNOSIS — K219 Gastro-esophageal reflux disease without esophagitis: Secondary | ICD-10-CM

## 2012-06-04 DIAGNOSIS — M1990 Unspecified osteoarthritis, unspecified site: Secondary | ICD-10-CM

## 2012-06-04 MED ORDER — IBUPROFEN 800 MG PO TABS: 800.0000 mg | ORAL_TABLET | Freq: Three times a day (TID) | ORAL | Status: DC | PRN

## 2012-06-04 MED ORDER — OMEPRAZOLE 40 MG PO CPDR: 40.0000 mg | DELAYED_RELEASE_CAPSULE | Freq: Every day | ORAL | Status: DC

## 2012-06-04 MED ORDER — LISINOPRIL-HYDROCHLOROTHIAZIDE 20-12.5 MG PO TABS: 2.0000 | ORAL_TABLET | Freq: Every day | ORAL | Status: DC

## 2012-06-04 NOTE — Telephone Encounter (Signed)
[facility] Rx dispense 90 days. Unable to put pharmacy in Epic fax # [phone].

## 2012-06-05 ENCOUNTER — Ambulatory Visit: Payer: Medicare Other

## 2012-06-06 ENCOUNTER — Other Ambulatory Visit: Payer: Self-pay | Admitting: Internal Medicine

## 2012-06-13 ENCOUNTER — Ambulatory Visit (INDEPENDENT_AMBULATORY_CARE_PROVIDER_SITE_OTHER): Payer: Medicare Other | Admitting: *Deleted

## 2012-06-13 DIAGNOSIS — E539 Vitamin B deficiency, unspecified: Secondary | ICD-10-CM

## 2012-06-13 MED ORDER — CYANOCOBALAMIN 1000 MCG/ML IJ SOLN
1000.0000 ug | Freq: Once | INTRAMUSCULAR | Status: AC
  Administered 2012-06-13: 1000 ug via INTRAMUSCULAR

## 2012-06-22 ENCOUNTER — Encounter (HOSPITAL_COMMUNITY): Payer: Self-pay | Admitting: *Deleted

## 2012-06-22 ENCOUNTER — Emergency Department (HOSPITAL_COMMUNITY): Payer: Medicare Other

## 2012-06-22 ENCOUNTER — Emergency Department (HOSPITAL_COMMUNITY)
Admission: EM | Admit: 2012-06-22 | Discharge: 2012-06-23 | Disposition: A | Discharge status: Home / Self Care | Payer: Medicare Other | Attending: Emergency Medicine | Admitting: Emergency Medicine

## 2012-06-22 DIAGNOSIS — Z8669 Personal history of other diseases of the nervous system and sense organs: Secondary | ICD-10-CM | POA: Diagnosis not present

## 2012-06-22 DIAGNOSIS — Z8659 Personal history of other mental and behavioral disorders: Secondary | ICD-10-CM | POA: Insufficient documentation

## 2012-06-22 DIAGNOSIS — J705 Respiratory conditions due to smoke inhalation: Secondary | ICD-10-CM | POA: Insufficient documentation

## 2012-06-22 DIAGNOSIS — Z87891 Personal history of nicotine dependence: Secondary | ICD-10-CM | POA: Insufficient documentation

## 2012-06-22 DIAGNOSIS — Z79899 Other long term (current) drug therapy: Secondary | ICD-10-CM | POA: Insufficient documentation

## 2012-06-22 DIAGNOSIS — Z8739 Personal history of other diseases of the musculoskeletal system and connective tissue: Secondary | ICD-10-CM | POA: Diagnosis not present

## 2012-06-22 DIAGNOSIS — I10 Essential (primary) hypertension: Secondary | ICD-10-CM | POA: Diagnosis not present

## 2012-06-22 DIAGNOSIS — R059 Cough, unspecified: Secondary | ICD-10-CM | POA: Diagnosis not present

## 2012-06-22 DIAGNOSIS — R0602 Shortness of breath: Secondary | ICD-10-CM | POA: Insufficient documentation

## 2012-06-22 DIAGNOSIS — R5381 Other malaise: Secondary | ICD-10-CM | POA: Diagnosis not present

## 2012-06-22 DIAGNOSIS — Z8701 Personal history of pneumonia (recurrent): Secondary | ICD-10-CM | POA: Diagnosis not present

## 2012-06-22 DIAGNOSIS — R51 Headache: Secondary | ICD-10-CM | POA: Insufficient documentation

## 2012-06-22 DIAGNOSIS — Z7982 Long term (current) use of aspirin: Secondary | ICD-10-CM | POA: Diagnosis not present

## 2012-06-22 DIAGNOSIS — K219 Gastro-esophageal reflux disease without esophagitis: Secondary | ICD-10-CM | POA: Diagnosis not present

## 2012-06-22 DIAGNOSIS — R079 Chest pain, unspecified: Secondary | ICD-10-CM | POA: Diagnosis not present

## 2012-06-22 DIAGNOSIS — R05 Cough: Secondary | ICD-10-CM | POA: Insufficient documentation

## 2012-06-22 DIAGNOSIS — R110 Nausea: Secondary | ICD-10-CM | POA: Insufficient documentation

## 2012-06-22 LAB — URINALYSIS, ROUTINE W REFLEX MICROSCOPIC
Glucose, UA: NEGATIVE mg/dL
Leukocytes, UA: NEGATIVE
Nitrite: NEGATIVE
Protein, ur: NEGATIVE mg/dL
pH: 5.5 (ref 5.0–8.0)

## 2012-06-22 LAB — BLOOD GAS, ARTERIAL
Drawn by: 28701
O2 Content: 3 L/min
Patient temperature: 98
pCO2 arterial: 41 mmHg (ref 35.0–45.0)
pH, Arterial: 7.392 (ref 7.350–7.450)

## 2012-06-22 LAB — CBC WITH DIFFERENTIAL/PLATELET
Basophils Absolute: 0 10*3/uL (ref 0.0–0.1)
Basophils Relative: 0 % (ref 0–1)
HCT: 40.3 % (ref 39.0–52.0)
Lymphocytes Relative: 34 % (ref 12–46)
MCHC: 33.5 g/dL (ref 30.0–36.0)
Monocytes Absolute: 0.5 10*3/uL (ref 0.1–1.0)
Neutro Abs: 4 10*3/uL (ref 1.7–7.7)
Neutrophils Relative %: 57 % (ref 43–77)
RDW: 15.5 % (ref 11.5–15.5)
WBC: 7.1 10*3/uL (ref 4.0–10.5)

## 2012-06-22 LAB — COMPREHENSIVE METABOLIC PANEL
Albumin: 3.4 g/dL — ABNORMAL LOW (ref 3.5–5.2)
BUN: 9 mg/dL (ref 6–23)
Creatinine, Ser: 1.09 mg/dL (ref 0.50–1.35)
GFR calc Af Amer: 79 mL/min — ABNORMAL LOW (ref >90)
Glucose, Bld: 110 mg/dL — ABNORMAL HIGH (ref 70–99)
Total Bilirubin: 0.2 mg/dL — ABNORMAL LOW (ref 0.3–1.2)
Total Protein: 7.3 g/dL (ref 6.0–8.3)

## 2012-06-22 LAB — CARBOXYHEMOGLOBIN
Carboxyhemoglobin: 2.7 % — ABNORMAL HIGH (ref 0.5–1.5)
O2 Saturation: 97.3 %
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
## 2012-06-22 NOTE — ED Notes (Signed)
Patient transported to X-ray 

## 2012-06-22 NOTE — ED Notes (Signed)
Assisted pt to call for ride.
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
## 2012-06-24 ENCOUNTER — Other Ambulatory Visit: Payer: Self-pay | Admitting: *Deleted

## 2012-06-24 DIAGNOSIS — M48062 Spinal stenosis, lumbar region with neurogenic claudication: Secondary | ICD-10-CM

## 2012-07-01 MED ORDER — AMITRIPTYLINE HCL 25 MG PO TABS: 25.0000 mg | ORAL_TABLET | Freq: Every day | ORAL | Status: DC

## 2012-07-01 NOTE — Telephone Encounter (Signed)
Thank you for finding this information!  I will re-prescribe it at this time and reassess the need to continue at the visit scheduled for later this month.  Please call in the prescription.  Thanks.

## 2012-07-01 NOTE — Telephone Encounter (Signed)
Rx called in to pharmacy. 

## 2012-07-01 NOTE — Telephone Encounter (Signed)
Talked with pt about reason for taking am . He is not sure why he takes it but I found a note from 2012 regarding the start of this med.  SPINAL STENOSIS, LUMBAR - Assessment & Plan Note     Mr. Edmonds states she's continued to have burning electric pain in his bilateral lower legs. This is likely secondary to spinal stenosis. Given that this pain is primarily neuropathic in character we will prescribe him with amitriptyline to be taken at night. This will help him sleep and hopefully assist further with his neuropathic pain. I will followup with him in a couple months and we will increase his gabapentin if amitriptyline proves ineffective   Pt does have an appointment 1/24 with Dr Josem Kaufmann.

## 2012-07-11 ENCOUNTER — Ambulatory Visit (INDEPENDENT_AMBULATORY_CARE_PROVIDER_SITE_OTHER): Payer: Medicare Other | Admitting: *Deleted

## 2012-07-11 ENCOUNTER — Encounter: Payer: Self-pay | Admitting: Internal Medicine

## 2012-07-11 DIAGNOSIS — E538 Deficiency of other specified B group vitamins: Secondary | ICD-10-CM

## 2012-07-11 MED ORDER — CYANOCOBALAMIN 1000 MCG/ML IJ SOLN
1000.0000 ug | Freq: Once | INTRAMUSCULAR | Status: AC
  Administered 2012-07-11: 1000 ug via INTRAMUSCULAR

## 2012-07-19 ENCOUNTER — Ambulatory Visit (INDEPENDENT_AMBULATORY_CARE_PROVIDER_SITE_OTHER): Payer: Medicare Other | Admitting: Internal Medicine

## 2012-07-19 ENCOUNTER — Encounter: Payer: Self-pay | Admitting: Internal Medicine

## 2012-07-19 VITALS — BP 133/73 | HR 86 | Temp 97.7°F | Ht 74.0 in | Wt 287.3 lb

## 2012-07-19 DIAGNOSIS — M48062 Spinal stenosis, lumbar region with neurogenic claudication: Secondary | ICD-10-CM | POA: Diagnosis not present

## 2012-07-19 DIAGNOSIS — M1990 Unspecified osteoarthritis, unspecified site: Secondary | ICD-10-CM | POA: Diagnosis not present

## 2012-07-19 DIAGNOSIS — K219 Gastro-esophageal reflux disease without esophagitis: Secondary | ICD-10-CM

## 2012-07-19 DIAGNOSIS — I10 Essential (primary) hypertension: Secondary | ICD-10-CM | POA: Diagnosis not present

## 2012-07-19 DIAGNOSIS — E538 Deficiency of other specified B group vitamins: Secondary | ICD-10-CM

## 2012-07-19 MED ORDER — VITAMIN B-12 500 MCG PO TABS: 1000.0000 ug | ORAL_TABLET | Freq: Every day | ORAL | Status: DC

## 2012-07-19 NOTE — Progress Notes (Signed)
  Subjective:    Patient ID: [name], male    DOB: January 09, 1946, 67 y.o.   MRN: [id number]  HPI  Please see the A&P for the status of the pt's chronic medical problems.  Review of Systems  Constitutional: Negative for activity change.  Respiratory: Negative for chest tightness and shortness of breath.   Cardiovascular: Positive for leg swelling. Negative for chest pain.  Gastrointestinal: Negative for nausea and abdominal pain.  Musculoskeletal: Positive for myalgias, arthralgias and gait problem. Negative for back pain and joint swelling.  Skin: Negative for rash and wound.  Neurological: Negative for syncope.  Psychiatric/Behavioral: Negative for behavioral problems, confusion, dysphoric mood and agitation.      Objective:   Physical Exam  Nursing note and vitals reviewed. Constitutional: He is oriented to person, place, and time. He appears well-developed and well-nourished. No distress.  HENT:  Head: Normocephalic and atraumatic.  Eyes: Conjunctivae normal are normal. No scleral icterus.  Musculoskeletal: He exhibits no edema and no tenderness.  Neurological: He is alert and oriented to person, place, and time. He exhibits normal muscle tone.  Skin: Skin is warm and dry. No rash noted. He is not diaphoretic. No erythema.  Psychiatric: He has a normal mood and affect. His behavior is normal. Judgment and thought content normal.      Assessment & Plan:   Please see problem oriented charting.

## 2012-07-19 NOTE — Patient Instructions (Signed)
It was good to see you again.  You are doing very well.  1) Keep taking all of your medications as prescribed.  These are working well.  2) I just ordered cyanocobalamin (vitamin B12 pills).  Take 2 pills daily.  I am hopeful this will help you avoid the shot.  We will check a vitamin B12 level at the next visit.  3) Since everything looks good I will see you back in 6 months.  Call if you need to be seen sooner.

## 2012-07-19 NOTE — Assessment & Plan Note (Signed)
Although not completely resolved, his arthritic pain is much improved with the ibuprofen which he takes on an as-needed basis. We will continue the ibuprofen 800 mg by mouth every 8 hours as needed for pain.

## 2012-07-19 NOTE — Assessment & Plan Note (Signed)
He has been receiving vitamin B12 shots. These will continue for several more months but I am initiating vitamin B12 1000 mcg by mouth daily in hopes of eventually getting him off of the shots altogether. This was started today and he understands the purpose of the supplementation.

## 2012-07-19 NOTE — Assessment & Plan Note (Signed)
Blood pressure is well controlled on his current regimen of amlodipine 10 mg daily and lisinopril/hydrochlorothiazide 40 mg/25 mg. We will continue the this regimen.

## 2012-07-19 NOTE — Assessment & Plan Note (Signed)
He only complains of a very rare episode of heartburn while taking the omeprazole 40 mg by mouth daily. We will continue his current regimen.

## 2012-07-19 NOTE — Assessment & Plan Note (Signed)
His neuropathic pain is well-controlled on the gabapentin. We will therefore continue at the 600 mg by mouth 3 times daily dose.

## 2012-09-04 ENCOUNTER — Ambulatory Visit (INDEPENDENT_AMBULATORY_CARE_PROVIDER_SITE_OTHER): Payer: Medicare Other | Admitting: Internal Medicine

## 2012-09-04 ENCOUNTER — Encounter: Payer: Self-pay | Admitting: Internal Medicine

## 2012-09-04 VITALS — BP 131/77 | HR 84 | Temp 97.6°F | Ht 74.0 in | Wt 290.9 lb

## 2012-09-04 DIAGNOSIS — R519 Headache, unspecified: Secondary | ICD-10-CM | POA: Insufficient documentation

## 2012-09-04 DIAGNOSIS — R51 Headache: Secondary | ICD-10-CM | POA: Diagnosis not present

## 2012-09-04 DIAGNOSIS — I10 Essential (primary) hypertension: Secondary | ICD-10-CM | POA: Diagnosis not present

## 2012-09-04 DIAGNOSIS — J309 Allergic rhinitis, unspecified: Secondary | ICD-10-CM | POA: Diagnosis not present

## 2012-09-04 MED ORDER — CETIRIZINE HCL 10 MG PO TABS: 10.0000 mg | ORAL_TABLET | Freq: Every day | ORAL | Status: DC

## 2012-09-04 NOTE — Progress Notes (Signed)
Subjective:   Patient ID: [name] male   DOB: Nov 19, 1945 67 y.o.   MRN: [id number]  HPI: [name] is a 67 y.o. man who presents to clinic today complaining of coughing, sneezing, stuffy nose, and headache.  He also needs follow up on his chronic medical problems including hypertension.    He states that he has been coughing, sneezing with his nose stuffed up, headache, itchy watery eyes, and stomach hurting over the last week.  He denies sick contacts, SOB, diarrhea, fevers, sore throat.  He is not having production from the cough.  HE has no tried anything to help the symptoms.    He has noted a headache over that same time period.  The headache is on the front left of his head.  He denies photophobia, worsening with sounds or smells.  He states that the headache is a sharp stabbing pain occasionally that dulls over the day.    Past Medical History  Diagnosis Date  . Cataract   . GERD (gastroesophageal reflux disease)   . Hypertension   . Anxiety   . Shortness of breath     with exertion   . Pneumonia     as a child  . Headache   . Arthritis    Current Outpatient Prescriptions  Medication Sig Dispense Refill  . amitriptyline (ELAVIL) 25 MG tablet Take 1 tablet (25 mg total) by mouth at bedtime.  30 tablet  11  . amLODipine (NORVASC) 10 MG tablet Take 1 tablet (10 mg total) by mouth daily.  30 tablet  11  . aspirin 81 MG EC tablet Take 81 mg by mouth daily.        . cyanocobalamin (,VITAMIN B-12,) 1000 MCG/ML injection Inject 1,000 mcg into the muscle every 30 (thirty) days.      [name] gabapentin (NEURONTIN) 300 MG capsule Take 2 capsules (600 mg total) by mouth 3 (three) times daily.  180 capsule  11  . ibuprofen (ADVIL,MOTRIN) 800 MG tablet Take 1 tablet (800 mg total) by mouth every 8 (eight) hours as needed for pain. Take with food  270 tablet  3  . lisinopril-hydrochlorothiazide (PRINZIDE,ZESTORETIC) 20-12.5 MG per tablet Take 2 tablets by mouth daily.  180 tablet   3  . omeprazole (PRILOSEC) 40 MG capsule Take 1 capsule (40 mg total) by mouth daily.  90 capsule  3  . vitamin B-12 (CYANOCOBALAMIN) 500 MCG tablet Take 2 tablets (1,000 mcg total) by mouth daily.  200 tablet  5   No current facility-administered medications for this visit.   Family History  Problem Relation Age of Onset  . Heart disease Mother   . Colon cancer Father 60  . Kidney disease Brother     on dialysis  . Pancreatic cancer Brother   . Hypertension Sister   . Diabetes Sister   . Anesthesia problems Neg Hx   . Kidney disease Brother     on dialysis  . Diabetes Sister    History   Social History  . Marital Status: Single    Spouse Name: N/A    Number of Children: N/A  . Years of Education: N/A   Social History Main Topics  . Smoking status: Former Smoker -- 0.50 packs/day for 30 years    Types: Cigarettes    Quit date: 11/05/2010  . Smokeless tobacco: Never Used  . Alcohol Use: No     Comment: Heavy in the past  . Drug Use: No  . Sexually  Active: No   Other Topics Concern  . None   Social History Narrative  . None   Review of Systems: A full 12 system ROS is negative except as noted in the HPI and A&P.   Objective:  Physical Exam: Filed Vitals:   09/04/12 0847  BP: 131/77  Pulse: 84  Temp: 97.6 F (36.4 C)  TempSrc: Oral  Height: 6\' 2"  (1.88 m)  Weight: 290 lb 14.4 oz (131.951 kg)  SpO2: 98%   Constitutional: Vital signs reviewed.  Patient is a well-developed and well-nourished man in no acute distress and cooperative with exam. Alert and oriented x3.  Head: Normocephalic and atraumatic, pain with palpation of the frontal and maxillary sinuses bilaterally. L>R Ear: TM normal bilaterally Nose: boggy turbinates with clear drainage.   Mouth: mild posterior pharynx erythema.  no exudates, MMM Nose: nasal mucosa cobblestoning with clear drainage.  Eyes: PERRL, EOMI, conjunctivae injected, No scleral icterus.  Neck: Supple, Trachea midline normal  ROM, No JVD, mass, thyromegaly, or carotid bruit present.  Cardiovascular: RRR, S1 normal, S2 normal, 3/6 systolic ejection murmur, no RG, pulses symmetric and intact bilaterally Pulmonary/Chest: CTAB, no wheezes, rales, or rhonchi Abdominal: Soft. Non-tender, non-distended, bowel sounds are normal, no masses, organomegaly, or guarding present.  GU: no CVA tenderness Musculoskeletal: No joint deformities, erythema, or stiffness, ROM full and no nontender Hematology: no cervical, inginal, or axillary adenopathy.  Neurological: A&O x3, Strength is normal and symmetric bilaterally, cranial nerve II-XII are grossly intact, no focal motor deficit, sensory intact to light touch bilaterally.  Skin: Warm, dry and intact. No rash, cyanosis, or clubbing.  Psychiatric: Normal mood and affect. speech and behavior is normal. Judgment and thought content normal. Cognition and memory are normal.   Assessment & Plan:

## 2012-09-04 NOTE — Patient Instructions (Addendum)
1.  Start Cetirizine (Zyrtec) 10 mg tablets.  Take 1 tablet daily for your runny nose and itchy eyes  2.  For the next 5 days take the Ibuprofen 800 mg tablets 3 times daily with meals  3.  Follow up with your primary care doctor in 1 month

## 2012-09-05 ENCOUNTER — Telehealth: Payer: Self-pay | Admitting: Licensed Clinical Social Worker

## 2012-09-05 NOTE — Telephone Encounter (Signed)
CSW received paperwork with pt's name and results from [facility] annual assessment.  CSW placed call to Mr. Barkdull to inquire about needs.  Pt states he was receiving 66 hours of PCS and was reduced to 50 hours.  Mr. Berg states he needs the additional hours he was receiving.  CSW informed Mr. Bankhead a request for additional hours/change of status will be submitted to PCP per pt request.

## 2012-09-11 DIAGNOSIS — Q828 Other specified congenital malformations of skin: Secondary | ICD-10-CM | POA: Diagnosis not present

## 2012-09-11 DIAGNOSIS — M79609 Pain in unspecified limb: Secondary | ICD-10-CM | POA: Diagnosis not present

## 2012-09-11 DIAGNOSIS — B351 Tinea unguium: Secondary | ICD-10-CM | POA: Diagnosis not present

## 2012-09-18 ENCOUNTER — Encounter: Payer: Medicare Other | Admitting: Dietician

## 2012-10-02 ENCOUNTER — Encounter: Payer: Self-pay | Admitting: Internal Medicine

## 2012-10-02 DIAGNOSIS — R269 Unspecified abnormalities of gait and mobility: Secondary | ICD-10-CM | POA: Insufficient documentation

## 2012-11-06 ENCOUNTER — Ambulatory Visit (INDEPENDENT_AMBULATORY_CARE_PROVIDER_SITE_OTHER): Payer: Medicare Other | Admitting: Internal Medicine

## 2012-11-06 ENCOUNTER — Encounter: Payer: Self-pay | Admitting: Internal Medicine

## 2012-11-06 VITALS — BP 131/76 | HR 74 | Temp 97.6°F | Ht 74.0 in | Wt 279.5 lb

## 2012-11-06 DIAGNOSIS — R82998 Other abnormal findings in urine: Secondary | ICD-10-CM | POA: Diagnosis not present

## 2012-11-06 DIAGNOSIS — R8290 Unspecified abnormal findings in urine: Secondary | ICD-10-CM

## 2012-11-06 DIAGNOSIS — R112 Nausea with vomiting, unspecified: Secondary | ICD-10-CM | POA: Diagnosis not present

## 2012-11-06 LAB — BASIC METABOLIC PANEL WITH GFR
CO2: 29 mEq/L (ref 19–32)
GFR, Est African American: 89 mL/min
Glucose, Bld: 82 mg/dL (ref 70–99)
Potassium: 4.1 mEq/L (ref 3.5–5.3)
Sodium: 141 mEq/L (ref 135–145)

## 2012-11-06 LAB — HEPATIC FUNCTION PANEL
ALT: 32 U/L (ref 0–53)
Bilirubin, Direct: 0.2 mg/dL (ref 0.0–0.3)
Indirect Bilirubin: 0.6 mg/dL (ref 0.0–0.9)

## 2012-11-06 LAB — POCT URINALYSIS DIPSTICK
Blood, UA: NEGATIVE
Urobilinogen, UA: 4

## 2012-11-06 LAB — CBC
MCH: 32.4 pg (ref 26.0–34.0)
MCV: 92.9 fL (ref 78.0–100.0)
Platelets: 185 10*3/uL (ref 150–400)
RBC: 4.76 MIL/uL (ref 4.22–5.81)

## 2012-11-06 NOTE — Patient Instructions (Signed)
If you urine contains continues to be dark please call the clinic for further evaluation and management.

## 2012-11-06 NOTE — Assessment & Plan Note (Signed)
Urine dip was obtained during this office visit which was notable only for bilirubin. Dark urine unclear etiology. Will obtain liver function tests, renal function as well as hepatitis panel with recent history of nausea vomiting and abdominal pain which at this point is resolved. If patient has recurrence of abdominal pain consider abdominal ultrasound. Patient was instructed if he continues to have dark urine he needs to call the clinic for further evaluation and management.

## 2012-11-06 NOTE — Progress Notes (Signed)
Subjective:   Patient ID: [name] male   DOB: 1946/03/27 67 y.o.   MRN: [id number]  HPI: [name] is a 67 y.o. male with past medical history significant as outlined below who presented to the clinic with dark urine.patient reports this has been present for some time. But it has been getting darker on a day to day basis. Patient denies any dysuria, increased urinary frequency or urgency or difficulty urination.He further reports that he had episodes of nausea and vomiting last week with some cramping in his abdomen which has resolved at this point. Denies any diarrhea constipation or blood in the stool. Denies any abdominal pain today. He thinks his abdomen is mildly distended.patient denies any fevers or chills, rashes.   Patient gave [place] a urine sample during the clinic visit it was noted to be brown/ red in color Past Medical History  Diagnosis Date  . Cataract   . GERD (gastroesophageal reflux disease)   . Hypertension   . Anxiety   . Shortness of breath     with exertion   . Pneumonia     as a child  . Headache   . Arthritis   . Allergic rhinitis 09/04/2012   Current Outpatient Prescriptions  Medication Sig Dispense Refill  . amitriptyline (ELAVIL) 25 MG tablet Take 1 tablet (25 mg total) by mouth at bedtime.  30 tablet  11  . amLODipine (NORVASC) 10 MG tablet Take 1 tablet (10 mg total) by mouth daily.  30 tablet  11  . aspirin 81 MG EC tablet Take 81 mg by mouth daily.        . cetirizine (ZYRTEC) 10 MG tablet Take 1 tablet (10 mg total) by mouth daily.  30 tablet  3  . cyanocobalamin (,VITAMIN B-12,) 1000 MCG/ML injection Inject 1,000 mcg into the muscle every 30 (thirty) days.      [name] gabapentin (NEURONTIN) 300 MG capsule Take 2 capsules (600 mg total) by mouth 3 (three) times daily.  180 capsule  11  . ibuprofen (ADVIL,MOTRIN) 800 MG tablet Take 1 tablet (800 mg total) by mouth every 8 (eight) hours as needed for pain. Take with food  270 tablet  3  .  lisinopril-hydrochlorothiazide (PRINZIDE,ZESTORETIC) 20-12.5 MG per tablet Take 2 tablets by mouth daily.  180 tablet  3  . omeprazole (PRILOSEC) 40 MG capsule Take 1 capsule (40 mg total) by mouth daily.  90 capsule  3  . vitamin B-12 (CYANOCOBALAMIN) 500 MCG tablet Take 2 tablets (1,000 mcg total) by mouth daily.  200 tablet  5   No current facility-administered medications for this visit.   Family History  Problem Relation Age of Onset  . Heart disease Mother   . Colon cancer Father 66  . Kidney disease Brother     on dialysis  . Pancreatic cancer Brother   . Hypertension Sister   . Diabetes Sister   . Anesthesia problems Neg Hx   . Kidney disease Brother     on dialysis  . Diabetes Sister    History   Social History  . Marital Status: Single    Spouse Name: N/A    Number of Children: N/A  . Years of Education: N/A   Social History Main Topics  . Smoking status: Former Smoker -- 0.50 packs/day for 30 years    Types: Cigarettes    Quit date: 11/05/2010  . Smokeless tobacco: Never Used  . Alcohol Use: No     Comment: Heavy in the  past  . Drug Use: No  . Sexually Active: No   Other Topics Concern  . None   Social History Narrative  . None   Review of Systems: Constitutional: Denies fever, chills, diaphoresis, appetite change and fatigue.  HEENT: Denies trouble swallowing, neck pain,  Respiratory: Denies SOB, DOE, cough, chest tightness,  and wheezing.   Cardiovascular: Denies chest pain, palpitations and leg swelling.  Gastrointestinal: Denies nausea, vomiting, abdominal pain, diarrhea, constipation, blood in stool and abdominal distention.  Genitourinary: Denies dysuria, urgency, frequency,  flank pain and difficulty urinating.  Skin: Denies pallor, rash and wound.  Neurological: Denies dizziness Hematological: Denies adenopathy. Easy bruising, personal or family bleeding history    Objective:  Physical Exam: Filed Vitals:   11/06/12 1027  BP: 131/76   Pulse: 74  Temp: 97.6 F (36.4 C)  TempSrc: Oral  Height: 6\' 2"  (1.88 m)  Weight: 279 lb 8 oz (126.78 kg)  SpO2: 99%   Constitutional: Vital signs reviewed.  Patient is a well-developed and well-nourished male  in no acute distress and cooperative with exam. Alert and oriented x3.  Eyes: PERRL, EOMI, conjunctivae injected, Mild yellow color   Neck: Supple,  Cardiovascular: RRR, S1 normal, S2 normal, no MRG, pulses symmetric and intact bilaterally, 1+ pitting edema  Pulmonary/Chest: CTAB, no wheezes, rales, or rhonchi Abdominal: Soft. Non-tender, mildly distended, bowel sounds are normal, no masses  GU: no CVA tenderness Hematology: no cervical adenopathy.  Neurological: A&O x3, Skin: Warm, dry and intact. No rash, cyanosis, or clubbing.

## 2012-11-07 ENCOUNTER — Encounter: Payer: Self-pay | Admitting: Internal Medicine

## 2012-11-07 LAB — HEPATITIS PANEL, ACUTE
HCV Ab: NEGATIVE
Hep A IgM: NEGATIVE

## 2012-11-11 NOTE — Progress Notes (Signed)
INTERNAL MEDICINE TEACHING ATTENDING ADDENDUM: I discussed this case with Dr. Loistine Chance soon after the patient visit. I have read the documentation and I agree with the plan of care. The patient only exhibited bilirubin in his urine dipstick results. His blood work was close to normal with one lone Alkaline phosphatase elevation. We will follow up with the patient regarding any further complaints.

## 2012-11-13 ENCOUNTER — Telehealth: Payer: Self-pay | Admitting: Internal Medicine

## 2012-11-13 DIAGNOSIS — R82998 Other abnormal findings in urine: Secondary | ICD-10-CM

## 2012-11-13 NOTE — Telephone Encounter (Signed)
Patient reported that he continues to have days with dark urine. He can not specify which color, he noted it is just dark. Denies any nausea vomiting, abdominal pain, fevers or chills. He scheduled to see Dr. Loistine Chance on 11/21/12

## 2012-11-21 ENCOUNTER — Encounter: Payer: Self-pay | Admitting: Internal Medicine

## 2012-11-21 ENCOUNTER — Ambulatory Visit (INDEPENDENT_AMBULATORY_CARE_PROVIDER_SITE_OTHER): Payer: Medicare Other | Admitting: Internal Medicine

## 2012-11-21 VITALS — BP 121/66 | HR 78 | Temp 98.4°F | Ht 74.0 in | Wt 278.7 lb

## 2012-11-21 DIAGNOSIS — M1990 Unspecified osteoarthritis, unspecified site: Secondary | ICD-10-CM

## 2012-11-21 DIAGNOSIS — R82998 Other abnormal findings in urine: Secondary | ICD-10-CM

## 2012-11-21 DIAGNOSIS — R112 Nausea with vomiting, unspecified: Secondary | ICD-10-CM | POA: Diagnosis not present

## 2012-11-21 DIAGNOSIS — R600 Localized edema: Secondary | ICD-10-CM

## 2012-11-21 DIAGNOSIS — R609 Edema, unspecified: Secondary | ICD-10-CM

## 2012-11-21 LAB — URINALYSIS, ROUTINE W REFLEX MICROSCOPIC
Leukocytes, UA: NEGATIVE
Nitrite: NEGATIVE
Specific Gravity, Urine: 1.014 (ref 1.005–1.030)
Urobilinogen, UA: 1 mg/dL (ref 0.0–1.0)
pH: 6.5 (ref 5.0–8.0)

## 2012-11-21 MED ORDER — CELECOXIB 100 MG PO CAPS: 100.0000 mg | ORAL_CAPSULE | Freq: Two times a day (BID) | ORAL | Status: DC | PRN

## 2012-11-21 NOTE — Assessment & Plan Note (Addendum)
Review FYI patient is no longer allowed to get controlled substances from the [facility]. His nausea, and emesis as well as abdominal discomfort may be related to GERD.  started taking ibuprofen 800 mg which may have been aggravating his symptoms. A stat patient on Celebrex 100 mg twice a day when necessary with meals. Patient has no history of coronary artery disease. I would defer to PCP to possibly start on tramadol for pain control

## 2012-11-21 NOTE — Progress Notes (Signed)
Subjective:   Patient ID: Hallie Ishida male   DOB: 21-Jan-1946 67 y.o.   MRN: [id number]  HPI: Mr.Tyronne Cubit is a 67 y.o. male with past medical history significant as outlined below who presented to the clinic for a followup. Patient was evaluated beginning of this month for dark urine. LFTs were within normal limits except AST mildly elevated. Hepatitis panel normal. CBC normal.  1. Dark Urine: episode of dark urine continues. Last time was on Tuesday.   2. Patient complains about leg swelling bilaterally on a daily basis. It has been present for some time. Denies any redness or pain, chest pain or shortness of breath  3. Nausea and vomiting: every now and then along with Heart burn. Denies any bloody emesis.He feels sick on the stomach and then he has to vomite. Denies any diarrhea or constipation. Occasionally mild abdominal pain around the umbilical area after BM.   Denies any fevers or chills     Past Medical History  Diagnosis Date  . Cataract   . GERD (gastroesophageal reflux disease)   . Hypertension   . Anxiety   . Shortness of breath     with exertion   . Pneumonia     as a child  . Headache(784.0)   . Arthritis   . Allergic rhinitis 09/04/2012   Current Outpatient Prescriptions  Medication Sig Dispense Refill  . amitriptyline (ELAVIL) 25 MG tablet Take 1 tablet (25 mg total) by mouth at bedtime.  30 tablet  11  . amLODipine (NORVASC) 10 MG tablet Take 1 tablet (10 mg total) by mouth daily.  30 tablet  11  . aspirin 81 MG EC tablet Take 81 mg by mouth daily.        . cetirizine (ZYRTEC) 10 MG tablet Take 1 tablet (10 mg total) by mouth daily.  30 tablet  3  . cyanocobalamin (,VITAMIN B-12,) 1000 MCG/ML injection Inject 1,000 mcg into the muscle every 30 (thirty) days.      [name] gabapentin (NEURONTIN) 300 MG capsule Take 2 capsules (600 mg total) by mouth 3 (three) times daily.  180 capsule  11  . lisinopril-hydrochlorothiazide (PRINZIDE,ZESTORETIC) 20-12.5 MG per  tablet Take 2 tablets by mouth daily.  180 tablet  3  . omeprazole (PRILOSEC) 40 MG capsule Take 1 capsule (40 mg total) by mouth daily.  90 capsule  3  . vitamin B-12 (CYANOCOBALAMIN) 500 MCG tablet Take 2 tablets (1,000 mcg total) by mouth daily.  200 tablet  5   No current facility-administered medications for this visit.   Family History  Problem Relation Age of Onset  . Heart disease Mother   . Colon cancer Father 39  . Kidney disease Brother     on dialysis  . Pancreatic cancer Brother   . Hypertension Sister   . Diabetes Sister   . Anesthesia problems Neg Hx   . Kidney disease Brother     on dialysis  . Diabetes Sister    History   Social History  . Marital Status: Single    Spouse Name: N/A    Number of Children: N/A  . Years of Education: N/A   Social History Main Topics  . Smoking status: Former Smoker -- 0.50 packs/day for 30 years    Types: Cigarettes    Quit date: 11/05/2010  . Smokeless tobacco: Never Used  . Alcohol Use: No     Comment: Heavy in the past  . Drug Use: No  . Sexually Active: No  Other Topics Concern  . None   Social History Narrative  . None   Review of Systems: Constitutional: Denies fever, chills, diaphoresis, appetite change and fatigue.   Respiratory: Denies SOB, DOE, cough, chest tightness,  and wheezing.   Cardiovascular: Denies chest pain, palpitations but noted  leg swelling.  Gastrointestinal: Noted nausea, vomiting, abdominal pain but denies  diarrhea, constipation, blood in stool and abdominal distention.  Genitourinary: Denies dysuria, urgency, frequency,  flank pain and difficulty urinating.  Skin: Denies pallor, rash and wound.  Neurological: Denies dizziness,   Objective:  Physical Exam: Filed Vitals:   11/21/12 0928  BP: 121/66  Pulse: 78  Temp: 98.4 F (36.9 C)  TempSrc: Oral  Height: 6\' 2"  (1.88 m)  Weight: 278 lb 11.2 oz (126.417 kg)  SpO2: 99%   Constitutional: Vital signs reviewed.  Patient is a  well-developed and well-nourished male in no acute distress and cooperative with exam. Alert and oriented x3.  Eyes: PERRL, EOMI, conjunctivae normal, No scleral icterus.  Neck: Supple,  Cardiovascular: RRR, S1 normal, S2 normal, no MRG, pulses symmetric and intact bilaterally. 1-2 + pitting edema present LE bilaterally Pulmonary/Chest: normal respiratory effort, CTAB, no wheezes, rales, or rhonchi Abdominal: Soft. Non-tender, non-distended, bowel sounds are normal, no masses,  Hematology: no cervical  adenopathy.  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

## 2012-11-21 NOTE — Assessment & Plan Note (Signed)
Likely due to chronic venous stasis. I instructed the patient to elevate his leg on a regular basis and provide a prescription for compression stockings. If persistent leg swelling is present may consider discontinue Norvasc.

## 2012-11-21 NOTE — Patient Instructions (Signed)
1. Elevate your leg on a regular basis 2. Use compression stockings during the day 3. Stop taking Ibuprofen

## 2012-11-21 NOTE — Assessment & Plan Note (Signed)
Unclear etiology at this point. Differential diagnosis include GERD since patient is currently taking ibuprofen on a regular basis.  liver liver enzymes have been within normal limits. Physical exam unremarkable. If patient continues to have persistent nausea and vomiting consider imaging studies.

## 2012-11-21 NOTE — Assessment & Plan Note (Signed)
Continues to have episodes of dark urine. Will obtain urine and analysis today. Awaiting results for further evaluation and management.

## 2012-11-22 LAB — COMPLETE METABOLIC PANEL WITH GFR
ALT: 13 U/L (ref 0–53)
AST: 18 U/L (ref 0–37)
Albumin: 3.5 g/dL (ref 3.5–5.2)
Alkaline Phosphatase: 52 U/L (ref 39–117)
Calcium: 9.7 mg/dL (ref 8.4–10.5)
Chloride: 101 mEq/L (ref 96–112)
Potassium: 4.2 mEq/L (ref 3.5–5.3)

## 2012-11-27 ENCOUNTER — Telehealth: Payer: Self-pay | Admitting: *Deleted

## 2012-11-27 NOTE — Telephone Encounter (Signed)
Fax from [facility] - pt is requesting to change from Celebrex to a lower cost alternative such as Meloxicam.  Thanks

## 2012-11-27 NOTE — Telephone Encounter (Signed)
I will discuss with PCP about other options.  [name]

## 2012-12-10 ENCOUNTER — Encounter: Payer: Self-pay | Admitting: *Deleted

## 2012-12-10 DIAGNOSIS — M1990 Unspecified osteoarthritis, unspecified site: Secondary | ICD-10-CM

## 2012-12-10 NOTE — Telephone Encounter (Signed)
Another call from [facility] asking for alternative.  They have been waiting since 6/3. Please advise.

## 2012-12-10 NOTE — Progress Notes (Signed)
Pt representative calls and ask about what celebrex will be changed to?

## 2012-12-11 MED ORDER — IBUPROFEN 800 MG PO TABS: 800.0000 mg | ORAL_TABLET | Freq: Three times a day (TID) | ORAL | Status: DC | PRN

## 2012-12-11 NOTE — Progress Notes (Signed)
[name] was tolerating the ibuprofen well for a very long time and I do not believe the presenting complaints are consistent with a side effect of the ibuprofen.  Thus, he is to restart the ibuprofen 800 mg by mouth every 8 hours as needed for arthritic pain, with food.  We will reassess his arthritic pain control and look for side effects upon re-initiation of this therapy.  This medication has been reordered.  Please contact representative with information.  Thanks.

## 2012-12-12 NOTE — Progress Notes (Signed)
Pt informed and voices understanding 

## 2012-12-21 NOTE — Assessment & Plan Note (Signed)
Start Zyrtec 10 mg daily or his rhinitis.  We also discussed steam treatment or saline nasal sprays.

## 2012-12-21 NOTE — Assessment & Plan Note (Signed)
He has a history of b12 deficiency and has been getting shots for sometime and is due again today.  We will give him his shot again and at his next visit suggest a recheck on his level.

## 2012-12-21 NOTE — Assessment & Plan Note (Signed)
His headache is consistent with a sinus headache.  We will treat with ibuprofen 800 mg TID for now until the symptoms resolve.

## 2012-12-21 NOTE — Assessment & Plan Note (Signed)
He has been taking his medications as prescribed and denies chest pain, or dizziness on standing.    BP Readings from Last 12 Encounters:  09/04/12 131/77  07/19/12 133/73  06/22/12 136/73   Blood pressure has been well controlled.  We wil continue his medications as prescribed for now.

## 2012-12-21 NOTE — Assessment & Plan Note (Signed)
He has likely arthritis in that right knee.  We will have him ice the knee, pain medication as well as work to get him over to PT for strengthening.

## 2012-12-21 NOTE — Assessment & Plan Note (Signed)
He states that he has been taking his medications as prescribed and denies headache, chest pain, or changes in his vision.    BP Readings from Last 12 Encounters:  03/14/12 135/74  11/15/11 128/75  11/08/11 117/69   BP is well controlled today.  We will continue his current medications as prescribed.

## 2012-12-23 ENCOUNTER — Ambulatory Visit: Payer: Medicare Other | Admitting: Internal Medicine

## 2012-12-30 ENCOUNTER — Ambulatory Visit (INDEPENDENT_AMBULATORY_CARE_PROVIDER_SITE_OTHER): Payer: Medicare Other | Admitting: Internal Medicine

## 2012-12-30 ENCOUNTER — Encounter: Payer: Self-pay | Admitting: Internal Medicine

## 2012-12-30 VITALS — BP 123/77 | HR 58 | Temp 97.3°F | Ht 74.0 in | Wt 274.7 lb

## 2012-12-30 DIAGNOSIS — R269 Unspecified abnormalities of gait and mobility: Secondary | ICD-10-CM | POA: Diagnosis not present

## 2012-12-30 DIAGNOSIS — R600 Localized edema: Secondary | ICD-10-CM

## 2012-12-30 DIAGNOSIS — M1990 Unspecified osteoarthritis, unspecified site: Secondary | ICD-10-CM | POA: Diagnosis not present

## 2012-12-30 DIAGNOSIS — M25569 Pain in unspecified knee: Secondary | ICD-10-CM | POA: Diagnosis not present

## 2012-12-30 DIAGNOSIS — M7989 Other specified soft tissue disorders: Secondary | ICD-10-CM

## 2012-12-30 DIAGNOSIS — M25561 Pain in right knee: Secondary | ICD-10-CM

## 2012-12-30 DIAGNOSIS — R609 Edema, unspecified: Secondary | ICD-10-CM

## 2012-12-30 MED ORDER — TRAMADOL HCL 50 MG PO TABS: 50.0000 mg | ORAL_TABLET | Freq: Three times a day (TID) | ORAL | Status: DC | PRN

## 2012-12-30 NOTE — Assessment & Plan Note (Signed)
Chronic leg edema. Right >left nonpitting today Patient has not filled compression stockings yet Advised to elevate feet and Rx right knee high TED hose (do not think thigh high will fit around diameter of leg).

## 2012-12-30 NOTE — Progress Notes (Addendum)
  Subjective:    Patient ID: [name], male    DOB: [birth year], 67 y.o.   MRN: [id number]  HPI Comments: 67 y.o PMH HTN (BP 123/77) other chronic medical conditions.  He presents for right leg pain and swelling.  Leg swelling is chronic and he has not picked up the Rx for his compression stockings yet.  He states pain is aching 2/10 to at most 6/10 and aggravated by walking.  Pain is all the way down right leg when present and continuous but "slows down".  He baseline walks with a cane.  He is taking Ibuprofen 800 mg daily.  Pain is worse in the am b/c he the evening he falls asleep taking Gabapentin and does not notice the pain.  He states sometimes his right leg gives out.  He denies any recent falls.  Denies nausea, vomiting, chest pain, +GERD, denies sob.    SH: lives alone; has an aid 5 days a week.  From [place]. No kids.  Used to work as a Visual merchandiser.  HAd 9 brothers now 5 living and 4 sisters now 3 living.  He lives in an assisted living facility.    ROS as noted in HPI  Leg Pain  The incident occurred more than 1 week ago. There was no injury mechanism. The pain is present in the right leg. The quality of the pain is described as aching. The pain is at a severity of 2/10. The pain has been intermittent since onset. The symptoms are aggravated by movement. He has tried NSAIDs for the symptoms. The treatment provided mild relief.      Review of Systems  All other systems reviewed and are negative.       Objective:   Physical Exam  Nursing note and vitals reviewed. Constitutional: He is oriented to person, place, and time. Vital signs are normal. He appears well-developed and well-nourished. He is cooperative.  HENT:  Head: Normocephalic and atraumatic.  Mouth/Throat: No oropharyngeal exudate.  Eyes: Conjunctivae are normal. Pupils are equal, round, and reactive to light. Right eye exhibits no discharge. Left eye exhibits no discharge. No scleral icterus.  Cardiovascular:  Normal rate, regular rhythm, S1 normal, S2 normal and normal heart sounds.   No murmur heard. Non pitting edema R>L  Pulmonary/Chest: Effort normal and breath sounds normal.  Abdominal: Soft. Bowel sounds are normal. He exhibits no distension. There is no tenderness.  Musculoskeletal: He exhibits no edema.  Neurological: He is alert and oriented to person, place, and time.  Skin: Skin is warm, dry and intact. No rash noted.  Psychiatric: He has a normal mood and affect. His speech is normal and behavior is normal. Judgment and thought content normal. Cognition and memory are normal.          Assessment & Plan:  F/u in 3-6 months with PCP for chronic medical conditions

## 2012-12-30 NOTE — Assessment & Plan Note (Signed)
Fall Screening 07/19/2012 09/04/2012 11/06/2012 11/21/2012 12/30/2012  Falls in the past year? No Yes Yes Yes No  Number of falls in past year - 2 or more 2 or more 1 -  Was there an injury with Fall? - - - No -  Risk Factor Category  - - High Fall Risk - -      Assessment: Progress toward fall prevention goal:    Comments: no falls recently walking with cane  Plan: Fall prevention plans: walk with cane Educational resources provided: brochure;handout Self management tools provided:

## 2012-12-30 NOTE — Patient Instructions (Addendum)
General Instructions: Take this new medication up to three times a day every 8 hours.  Total up to 3 pills a day Follow up with your regular doctor in 3-6 months for other medical problems.  Blood pressure looks great! Bring your medications to each visit.  Treatment Goals:  Goals (1 Years of Data) as of 12/30/12         As of Today 11/21/12 11/06/12 09/04/12 07/19/12     Blood Pressure    . Blood Pressure < 140/90  123/77 121/66 131/76 131/77 133/73     Lifestyle    . Prevent Falls           Weight    . Weight < 200 lb (90.719 kg)  274 lb 11.2 oz (124.603 kg) 278 lb 11.2 oz (126.417 kg) 279 lb 8 oz (126.78 kg) 290 lb 14.4 oz (131.951 kg) 287 lb 4.8 oz (130.318 kg)      Progress Toward Treatment Goals:  Treatment Goal 12/30/2012  Blood pressure at goal    Self Care Goals & Plans:  Self Care Goal 12/30/2012  Manage my medications take my medicines as prescribed; bring my medications to every visit; refill my medications on time  Monitor my health -  Eat healthy foods drink diet soda or water instead of juice or soda; eat more vegetables; eat foods that are low in salt; eat baked foods instead of fried foods; eat fruit for snacks and desserts; eat smaller portions  Be physically active find an activity I enjoy  Prevent falls -  Meeting treatment goals maintain the current self-care plan       Care Management & Community Referrals:  Referral 12/30/2012  Referrals made for care management support none needed  Referrals made to community resources none       Hypertension Hypertension is another name for high blood pressure. High blood pressure may mean that your heart needs to work harder to pump blood. Blood pressure consists of two numbers, which includes a higher number over a lower number (example: 110/72). HOME CARE   Make lifestyle changes as told by your doctor. This may include weight loss and exercise.  Take your blood pressure medicine every day.  Limit how much  salt you use.  Stop smoking if you smoke.  Do not use drugs.  Talk to your doctor if you are using decongestants or birth control pills. These medicines might make blood pressure higher.  Females should not drink more than 1 alcoholic drink per day. Males should not drink more than 2 alcoholic drinks per day.  See your doctor as told. GET HELP RIGHT AWAY IF:   You have a blood pressure reading with a top number of 180 or higher.  You get a very bad headache.  You get blurred or changing vision.  You feel confused.  You feel weak, numb, or faint.  You get chest or belly (abdominal) pain.  You throw up (vomit).  You cannot breathe very well. MAKE SURE YOU:   Understand these instructions.  Will watch your condition.  Will get help right away if you are not doing well or get worse. Document Released: 11/29/2007 Document Revised: 09/04/2011 Document Reviewed: 11/29/2007 [facility] Patient Information 2014 [place], [place].  Fall Prevention and Home Safety Falls cause injuries and can affect all age groups. It is possible to use preventive measures to significantly decrease the likelihood of falls. There are many simple measures which can make your home safer and prevent falls. OUTDOORS  Repair cracks and edges of walkways and driveways.  Remove high doorway thresholds.  Trim shrubbery on the main path into your home.  Have good outside lighting.  Clear walkways of tools, rocks, debris, and clutter.  Check that handrails are not broken and are securely fastened. Both sides of steps should have handrails.  Have leaves, snow, and ice cleared regularly.  Use sand or salt on walkways during winter months.  In the garage, clean up grease or oil spills. BATHROOM  Install night lights.  Install grab bars by the toilet and in the tub and shower.  Use non-skid mats or decals in the tub or shower.  Place a plastic non-slip stool in the shower to sit on, if  needed.  Keep floors dry and clean up all water on the floor immediately.  Remove soap buildup in the tub or shower on a regular basis.  Secure bath mats with non-slip, double-sided rug tape.  Remove throw rugs and tripping hazards from the floors. BEDROOMS  Install night lights.  Make sure a bedside light is easy to reach.  Do not use oversized bedding.  Keep a telephone by your bedside.  Have a firm chair with side arms to use for getting dressed.  Remove throw rugs and tripping hazards from the floor. KITCHEN  Keep handles on pots and pans turned toward the center of the stove. Use back burners when possible.  Clean up spills quickly and allow time for drying.  Avoid walking on wet floors.  Avoid hot utensils and knives.  Position shelves so they are not too high or low.  Place commonly used objects within easy reach.  If necessary, use a sturdy step stool with a grab bar when reaching.  Keep electrical cables out of the way.  Do not use floor polish or wax that makes floors slippery. If you must use wax, use non-skid floor wax.  Remove throw rugs and tripping hazards from the floor. STAIRWAYS  Never leave objects on stairs.  Place handrails on both sides of stairways and use them. Fix any loose handrails. Make sure handrails on both sides of the stairways are as long as the stairs.  Check carpeting to make sure it is firmly attached along stairs. Make repairs to worn or loose carpet promptly.  Avoid placing throw rugs at the top or bottom of stairways, or properly secure the rug with carpet tape to prevent slippage. Get rid of throw rugs, if possible.  Have an electrician put in a light switch at the top and bottom of the stairs. OTHER FALL PREVENTION TIPS  Wear low-heel or rubber-soled shoes that are supportive and fit well. Wear closed toe shoes.  When using a stepladder, make sure it is fully opened and both spreaders are firmly locked. Do not climb a  closed stepladder.  Add color or contrast paint or tape to grab bars and handrails in your home. Place contrasting color strips on first and last steps.  Learn and use mobility aids as needed. Install an electrical emergency response system.  Turn on lights to avoid dark areas. Replace light bulbs that burn out immediately. Get light switches that glow.  Arrange furniture to create clear pathways. Keep furniture in the same place.  Firmly attach carpet with non-skid or double-sided tape.  Eliminate uneven floor surfaces.  Select a carpet pattern that does not visually hide the edge of steps.  Be aware of all pets. OTHER HOME SAFETY TIPS  Set the water temperature  for 120 F (48.8 C).  Keep emergency numbers on or near the telephone.  Keep smoke detectors on every level of the home and near sleeping areas. Document Released: 06/02/2002 Document Revised: 12/12/2011 Document Reviewed: 09/01/2011 [facility] Patient Information 2014 [place], [place].  Tramadol tablets What is this medicine? TRAMADOL (TRA ma dole) is a pain reliever. It is used to treat moderate to severe pain in adults. This medicine may be used for other purposes; ask your health care provider or pharmacist if you have questions. What should I tell my health care provider before I take this medicine? They need to know if you have any of these conditions: -brain tumor -depression -drug abuse or addiction -head injury -if you frequently drink alcohol containing drinks -kidney disease or trouble passing urine -liver disease -lung disease, asthma, or breathing problems -seizures or epilepsy -suicidal thoughts, plans, or attempt; a previous suicide attempt by you or a family member -an unusual or allergic reaction to tramadol, codeine, other medicines, foods, dyes, or preservatives -pregnant or trying to get pregnant -breast-feeding How should I use this medicine? Take this medicine by mouth with a full glass of  water. Follow the directions on the prescription label. If the medicine upsets your stomach, take it with food or milk. Do not take more medicine than you are told to take. Talk to your pediatrician regarding the use of this medicine in children. Special care may be needed. Overdosage: If you think you have taken too much of this medicine contact a poison control center or emergency room at once. NOTE: This medicine is only for you. Do not share this medicine with others. What if I miss a dose? If you miss a dose, take it as soon as you can. If it is almost time for your next dose, take only that dose. Do not take double or extra doses. What may interact with this medicine? Do not take this medicine with any of the following medications: -MAOIs like Carbex, Eldepryl, Marplan, Nardil, and Parnate This medicine may also interact with the following medications: -alcohol or medicines that contain alcohol -antihistamines -benzodiazepines -bupropion -carbamazepine or oxcarbazepine -clozapine -cyclobenzaprine -digoxin -furazolidone -linezolid -medicines for depression, anxiety, or psychotic disturbances -medicines for migraine headache like almotriptan, eletriptan, frovatriptan, naratriptan, rizatriptan, sumatriptan, zolmitriptan -medicines for pain like pentazocine, buprenorphine, butorphanol, meperidine, nalbuphine, and propoxyphene -medicines for sleep -muscle relaxants -naltrexone -phenobarbital -phenothiazines like perphenazine, thioridazine, chlorpromazine, mesoridazine, fluphenazine, prochlorperazine, promazine, and trifluoperazine -procarbazine -warfarin This list may not describe all possible interactions. Give your health care provider a list of all the medicines, herbs, non-prescription drugs, or dietary supplements you use. Also tell them if you smoke, drink alcohol, or use illegal drugs. Some items may interact with your medicine. What should I watch for while using this  medicine? Tell your doctor or health care professional if your pain does not go away, if it gets worse, or if you have new or a different type of pain. You may develop tolerance to the medicine. Tolerance means that you will need a higher dose of the medicine for pain relief. Tolerance is normal and is expected if you take this medicine for a long time. Do not suddenly stop taking your medicine because you may develop a severe reaction. Your body becomes used to the medicine. This does NOT mean you are addicted. Addiction is a behavior related to getting and using a drug for a non-medical reason. If you have pain, you have a medical reason to take pain medicine. Your  doctor will tell you how much medicine to take. If your doctor wants you to stop the medicine, the dose will be slowly lowered over time to avoid any side effects. You may get drowsy or dizzy. Do not drive, use machinery, or do anything that needs mental alertness until you know how this medicine affects you. Do not stand or sit up quickly, especially if you are an older patient. This reduces the risk of dizzy or fainting spells. Alcohol can increase or decrease the effects of this medicine. Avoid alcoholic drinks. You may have constipation. Try to have a bowel movement at least every 2 to 3 days. If you do not have a bowel movement for 3 days, call your doctor or health care professional. Your mouth may get dry. Chewing sugarless gum or sucking hard candy, and drinking plenty of water may help. Contact your doctor if the problem does not go away or is severe. What side effects may I notice from receiving this medicine? Side effects that you should report to your doctor or health care professional as soon as possible: -allergic reactions like skin rash, itching or hives, swelling of the face, lips, or tongue -breathing difficulties, wheezing -confusion -itching -light headedness or fainting spells -redness, blistering, peeling or loosening  of the skin, including inside the mouth -seizures Side effects that usually do not require medical attention (report to your doctor or health care professional if they continue or are bothersome): -constipation -dizziness -drowsiness -headache -nausea, vomiting This list may not describe all possible side effects. Call your doctor for medical advice about side effects. You may report side effects to FDA at 1-800-FDA-1088. Where should I keep my medicine? Keep out of the reach of children. Store at room temperature between 15 and 30 degrees C (59 and 86 degrees F). Keep container tightly closed. Throw away any unused medicine after the expiration date. NOTE: This sheet is a summary. It may not cover all possible information. If you have questions about this medicine, talk to your doctor, pharmacist, or health care provider.  2013, [employer]. (02/23/2010 11:55:44 AM)  Edema Edema is an abnormal build-up of fluids in tissues. Because this is partly dependent on gravity (water flows to the lowest place), it is more common in the legs and thighs (lower extremities). It is also common in the looser tissues, like around the eyes. Painless swelling of the feet and ankles is common and increases as a person ages. It may affect both legs and may include the calves or even thighs. When squeezed, the fluid may move out of the affected area and may leave a dent for a few moments. CAUSES   Prolonged standing or sitting in one place for extended periods of time. Movement helps pump tissue fluid into the veins, and absence of movement prevents this, resulting in edema.  Varicose veins. The valves in the veins do not work as well as they should. This causes fluid to leak into the tissues.  Fluid and salt overload.  Injury, burn, or surgery to the leg, ankle, or foot, may damage veins and allow fluid to leak out.  Sunburn damages vessels. Leaky vessels allow fluid to go out into the sunburned  tissues.  Allergies (from insect bites or stings, medications or chemicals) cause swelling by allowing vessels to become leaky.  Protein in the blood helps keep fluid in your vessels. Low protein, as in malnutrition, allows fluid to leak out.  Hormonal changes, including pregnancy and menstruation, cause fluid retention.  This fluid may leak out of vessels and cause edema.  Medications that cause fluid retention. Examples are sex hormones, blood pressure medications, steroid treatment, or anti-depressants.  Some illnesses cause edema, especially heart failure, kidney disease, or liver disease.  Surgery that cuts veins or lymph nodes, such as surgery done for the heart or for breast cancer, may result in edema. DIAGNOSIS  Your caregiver is usually easily able to determine what is causing your swelling (edema) by simply asking what is wrong (getting a history) and examining you (doing a physical). Sometimes x-rays, EKG (electrocardiogram or heart tracing), and blood work may be done to evaluate for underlying medical illness. TREATMENT  General treatment includes:  Leg elevation (or elevation of the affected body part).  Restriction of fluid intake.  Prevention of fluid overload.  Compression of the affected body part. Compression with elastic bandages or support stockings squeezes the tissues, preventing fluid from entering and forcing it back into the blood vessels.  Diuretics (also called water pills or fluid pills) pull fluid out of your body in the form of increased urination. These are effective in reducing the swelling, but can have side effects and must be used only under your caregiver's supervision. Diuretics are appropriate only for some types of edema. The specific treatment can be directed at any underlying causes discovered. Heart, liver, or kidney disease should be treated appropriately. HOME CARE INSTRUCTIONS   Elevate the legs (or affected body part) above the level of the  heart, while lying down.  Avoid sitting or standing still for prolonged periods of time.  Avoid putting anything directly under the knees when lying down, and do not wear constricting clothing or garters on the upper legs.  Exercising the legs causes the fluid to work back into the veins and lymphatic channels. This may help the swelling go down.  The pressure applied by elastic bandages or support stockings can help reduce ankle swelling.  A low-salt diet may help reduce fluid retention and decrease the ankle swelling.  Take any medications exactly as prescribed. SEEK MEDICAL CARE IF:  Your edema is not responding to recommended treatments. SEEK IMMEDIATE MEDICAL CARE IF:   You develop shortness of breath or chest pain.  You cannot breathe when you lay down; or if, while lying down, you have to get up and go to the window to get your breath.  You are having increasing swelling without relief from treatment.  You develop a fever over 102 F (38.9 C).  You develop pain or redness in the areas that are swollen.  Tell your caregiver right away if you have gained 3 lb/1.4 kg in 1 day or 5 lb/2.3 kg in a week. MAKE SURE YOU:   Understand these instructions.  Will watch your condition.  Will get help right away if you are not doing well or get worse. Document Released: 06/12/2005 Document Revised: 12/12/2011 Document Reviewed: 01/29/2008 [facility] Patient Information 2014 [place], [place].

## 2012-12-30 NOTE — Assessment & Plan Note (Signed)
No TTP right knee  Tried Ultram 50 mg q 8 hours in addition to Ibuprofen 800 mg qam if needed  Pain is generalized right leg and intermittent right knee pain. Denies calf pain

## 2013-01-02 NOTE — Progress Notes (Signed)
Case discussed with Dr. McLean soon after the resident saw the patient.  We reviewed the resident's history and exam and pertinent patient test results.  I agree with the assessment, diagnosis, and plan of care documented in the resident's note. 

## 2013-03-04 ENCOUNTER — Other Ambulatory Visit: Payer: Self-pay | Admitting: Internal Medicine

## 2013-03-06 ENCOUNTER — Encounter: Payer: Self-pay | Admitting: Gastroenterology

## 2013-03-10 DIAGNOSIS — H33059 Total retinal detachment, unspecified eye: Secondary | ICD-10-CM | POA: Diagnosis not present

## 2013-03-14 ENCOUNTER — Encounter (HOSPITAL_COMMUNITY): Payer: Self-pay | Admitting: *Deleted

## 2013-03-14 ENCOUNTER — Encounter (HOSPITAL_COMMUNITY): Payer: Self-pay | Admitting: Pharmacist

## 2013-03-14 ENCOUNTER — Other Ambulatory Visit: Payer: Self-pay | Admitting: Ophthalmology

## 2013-03-14 DIAGNOSIS — H33019 Retinal detachment with single break, unspecified eye: Secondary | ICD-10-CM | POA: Diagnosis not present

## 2013-03-14 NOTE — H&P (Signed)
Patient Record  [name], [name] Patient Number:  [id number] Date of Birth:  1945/08/30 Age:  67 years old    Gender:  Male Date of Evaluation:  March 14, 2013  Chief Complaint:   67 year old male is referred for evaluation and management of a retina detachment right eye. History of Present Illness:   Patient has h ad cataract surgery od by Dr. [name].  Lost to follow up.  Called t his AM with cc/o inability to see od.  Not s ure howe long.  No pain or discomfort.  Advised to come in.   Presents for evaluation.  Uses pred forte occaisionally. Past History:  Allergies:  nkda, Active Medications:   Ocular Medications:  Pred Acetate 1% 1 gtt Od QD Hx of Bromday 1 gtt OD QD Other Medications:  Hydrochlorothiazide 25mg  daily, Gabapentin 300mg  2 caps. TID, Norvasc 10mg  daily, Omeprazole 40mg  daily, Lisinopril 40mg  daily Birth History:  none Past Ocular History:   History of Chronic Iritis OU Combined Cataracts OU Past Medical History:   Hypertension, Arthritis Past Surgical History:   rotator cuff surgery Family History:  no amblyopia, + blindness (brother), + cataracts (mother), no crossed eyes, no diabetic retinopathy, no glaucoma, no macular degeneration, no retinal detachment, no cancer, + diabetes (sister, brother), no heart disease, + high blood pressure (brother, sister), no stroke Social History:   Smoking Status: former smoker  Alcohol:  none   Driving status:  not driving Marital status:  widowed Review of Systems:   Eyes: + blurred vision, + pain, + tearing   Ear/Nose/Throat: + hearing loss  Musculoskeletal: + low back pain, + arthritis,   Neurological: + weakness  All other systems are negative.  Examination:  Visual Acuity:   Distance VA cc:  OD: 20/80 ph NI  Distance VA [place]:     OS: NLP ph NI IOP:  OD:  22     OS:  14    @ 02:58PM (Goldmann applanation) Manifest Refraction:    Sphere    Cyl Axis       VA         Add       VA Prism Base Confrontation visual  field: Constricted OD, Absent OS Motility: OU:  Normal  Pupils:  OU:  Shape, size, direct and consensual reaction normal  Adnexa:  Preauricular LN, lacrimal drainage, lacrimal glands, orbit normal  Eyelids: Eyelids:  normal Conjunctiva: non-injected Cornea:  OU:  arcus, mild SPK, pigmented guttata OU, old  Anterior Chamber:OD:  3+ deep/1+ flare, 5  cells per hpf  Iris:  OD:  some atrophy of anterior stroma OU:  normal, rubeosis absent  Lens: OU:  PC IOL in good position  Vitreous: OD:  PVD  OS:  post PPV  Optic Disc: OD:  cupping: 0.35  OS:  cupping: 0.35  Macula: OD:  sub-macular fluid  OS:  normal  Vessels: OU:  normal  Periphery:  OD:  Retinal detachment; Macula off  OS:  normal  Orientation to person, place and time:  Normal  Mood and affect:  Normal  Impression:  361.01  Retinal Detachment, partial, 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 The patient indicated understanding and expressed feeling reassured. 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 I have recommended Scleral Buckle with PPV, FGX, EL OD, possible silicone oil pending intra-operative anatomy. As he lives alone may consider SO to assist his function during recovery.  He indicated understanding our discussion and felt that his questions had been answered to his satisfaction.  He indicates that he desires to proceed with the recommended treatment/care plan.   Medications:   DC pred forte Patient Instructions: Please do not eat anything after mignight the day before surgery. Return to clinic:  To see Dr. [name].  Schedule:  [place] Vitrectomy, Scleral Buckle, Fluid Gas  Exchange, Endolaser OD x 03/15/2013.   (electronically signed) Shade Flood, MD

## 2013-03-15 ENCOUNTER — Encounter (HOSPITAL_COMMUNITY): Payer: Self-pay | Admitting: Anesthesiology

## 2013-03-15 ENCOUNTER — Encounter (HOSPITAL_COMMUNITY)
Admission: RE | Disposition: A | Discharge status: Home / Self Care | Payer: Self-pay | Source: Ambulatory Visit | Attending: Ophthalmology

## 2013-03-15 ENCOUNTER — Ambulatory Visit (HOSPITAL_COMMUNITY): Payer: Medicare Other | Admitting: Anesthesiology

## 2013-03-15 ENCOUNTER — Encounter (HOSPITAL_COMMUNITY): Payer: Self-pay | Admitting: *Deleted

## 2013-03-15 ENCOUNTER — Ambulatory Visit (HOSPITAL_COMMUNITY)
Admission: RE | Admit: 2013-03-15 | Discharge: 2013-03-15 | Disposition: A | Discharge status: Home / Self Care | Payer: Medicare Other | Source: Ambulatory Visit | Attending: Ophthalmology | Admitting: Ophthalmology

## 2013-03-15 DIAGNOSIS — K219 Gastro-esophageal reflux disease without esophagitis: Secondary | ICD-10-CM | POA: Diagnosis not present

## 2013-03-15 DIAGNOSIS — H33019 Retinal detachment with single break, unspecified eye: Secondary | ICD-10-CM | POA: Diagnosis not present

## 2013-03-15 DIAGNOSIS — I10 Essential (primary) hypertension: Secondary | ICD-10-CM | POA: Diagnosis not present

## 2013-03-15 DIAGNOSIS — H33009 Unspecified retinal detachment with retinal break, unspecified eye: Secondary | ICD-10-CM | POA: Insufficient documentation

## 2013-03-15 HISTORY — PX: SCLERAL BUCKLE: SHX5340

## 2013-03-15 LAB — CBC
HCT: 44.4 % (ref 39.0–52.0)
Hemoglobin: 15.8 g/dL (ref 13.0–17.0)
RBC: 4.71 MIL/uL (ref 4.22–5.81)
WBC: 5.8 10*3/uL (ref 4.0–10.5)

## 2013-03-15 LAB — BASIC METABOLIC PANEL
BUN: 10 mg/dL (ref 6–23)
Chloride: 102 mEq/L (ref 96–112)
GFR calc Af Amer: >90 mL/min (ref >90)
Glucose, Bld: 79 mg/dL (ref 70–99)
Potassium: 3.4 mEq/L — ABNORMAL LOW (ref 3.5–5.1)
Sodium: 141 mEq/L (ref 135–145)

## 2013-03-15 SURGERY — SCLERAL BUCKLE
Anesthesia: General | Site: Eye | Laterality: Right | Wound class: Clean Contaminated

## 2013-03-15 MED ORDER — HYPROMELLOSE (GONIOSCOPIC) 2.5 % OP SOLN
OPHTHALMIC | Status: AC
  Filled 2013-03-15: qty 15

## 2013-03-15 MED ORDER — LIDOCAINE HCL (CARDIAC) 20 MG/ML IV SOLN
INTRAVENOUS | Status: DC | PRN
  Administered 2013-03-15: 40 mg via INTRAVENOUS

## 2013-03-15 MED ORDER — MIDAZOLAM HCL 5 MG/5ML IJ SOLN
INTRAMUSCULAR | Status: DC | PRN
  Administered 2013-03-15: 1 mg via INTRAVENOUS

## 2013-03-15 MED ORDER — HYDROMORPHONE HCL PF 1 MG/ML IJ SOLN
0.2500 mg | INTRAMUSCULAR | Status: DC | PRN
  Administered 2013-03-15: 0.5 mg via INTRAVENOUS

## 2013-03-15 MED ORDER — HYPROMELLOSE (GONIOSCOPIC) 2.5 % OP SOLN
OPHTHALMIC | Status: DC | PRN
  Administered 2013-03-15: 2 [drp] via OPHTHALMIC

## 2013-03-15 MED ORDER — BSS PLUS IO SOLN
INTRAOCULAR | Status: AC
  Filled 2013-03-15: qty 500

## 2013-03-15 MED ORDER — DEXAMETHASONE SODIUM PHOSPHATE 10 MG/ML IJ SOLN
INTRAMUSCULAR | Status: AC
  Filled 2013-03-15: qty 1

## 2013-03-15 MED ORDER — TETRACAINE HCL 0.5 % OP SOLN
2.0000 [drp] | OPHTHALMIC | Status: AC
  Administered 2013-03-15: 2 [drp] via OPHTHALMIC
  Filled 2013-03-15: qty 2

## 2013-03-15 MED ORDER — PREDNISOLONE ACETATE 1 % OP SUSP
1.0000 [drp] | OPHTHALMIC | Status: AC
  Administered 2013-03-15: 1 [drp] via OPHTHALMIC
  Filled 2013-03-15: qty 5

## 2013-03-15 MED ORDER — BUPIVACAINE HCL (PF) 0.75 % IJ SOLN
INTRAMUSCULAR | Status: AC
  Filled 2013-03-15: qty 10

## 2013-03-15 MED ORDER — ROCURONIUM BROMIDE 100 MG/10ML IV SOLN
INTRAVENOUS | Status: DC | PRN
  Administered 2013-03-15: 20 mg via INTRAVENOUS
  Administered 2013-03-15: 50 mg via INTRAVENOUS

## 2013-03-15 MED ORDER — GATIFLOXACIN 0.5 % OP SOLN
1.0000 [drp] | OPHTHALMIC | Status: AC | PRN
  Administered 2013-03-15 (×3): 1 [drp] via OPHTHALMIC
  Filled 2013-03-15: qty 2.5

## 2013-03-15 MED ORDER — EPINEPHRINE HCL 1 MG/ML IJ SOLN
INTRAMUSCULAR | Status: DC | PRN
  Administered 2013-03-15: .3 mL

## 2013-03-15 MED ORDER — LIDOCAINE HCL 2 % IJ SOLN
INTRAMUSCULAR | Status: AC
  Filled 2013-03-15: qty 20

## 2013-03-15 MED ORDER — LACTATED RINGERS IV SOLN: INTRAVENOUS | Status: DC | PRN

## 2013-03-15 MED ORDER — HYDROMORPHONE HCL PF 1 MG/ML IJ SOLN
INTRAMUSCULAR | Status: AC
  Administered 2013-03-15: 0.5 mg via INTRAVENOUS
  Filled 2013-03-15: qty 1

## 2013-03-15 MED ORDER — SODIUM CHLORIDE 0.9 % IV SOLN: INTRAVENOUS | Status: DC

## 2013-03-15 MED ORDER — PROPOFOL 10 MG/ML IV BOLUS
INTRAVENOUS | Status: DC | PRN
  Administered 2013-03-15: 190 mg via INTRAVENOUS

## 2013-03-15 MED ORDER — BSS IO SOLN
INTRAOCULAR | Status: DC | PRN
  Administered 2013-03-15: 500 mL via INTRAOCULAR

## 2013-03-15 MED ORDER — CEFAZOLIN SUBCONJUNCTIVAL INJECTION 100 MG/0.5 ML
100.0000 mg | INJECTION | SUBCONJUNCTIVAL | Status: DC
  Filled 2013-03-15: qty 0.5

## 2013-03-15 MED ORDER — NEOSTIGMINE METHYLSULFATE 1 MG/ML IJ SOLN
INTRAMUSCULAR | Status: DC | PRN
  Administered 2013-03-15: 5 mg via INTRAVENOUS

## 2013-03-15 MED ORDER — TRAMADOL HCL 50 MG PO TABS: 50.0000 mg | ORAL_TABLET | ORAL | Status: DC | PRN

## 2013-03-15 MED ORDER — GLYCOPYRROLATE 0.2 MG/ML IJ SOLN
INTRAMUSCULAR | Status: DC | PRN
  Administered 2013-03-15: 1 mg via INTRAVENOUS

## 2013-03-15 MED ORDER — SODIUM CHLORIDE 0.9 % IV SOLN
10.0000 mg | INTRAVENOUS | Status: DC | PRN
  Administered 2013-03-15: 10 ug/min via INTRAVENOUS

## 2013-03-15 MED ORDER — SODIUM CHLORIDE 0.9 % IV SOLN
INTRAVENOUS | Status: DC | PRN
  Administered 2013-03-15 (×3): via INTRAVENOUS

## 2013-03-15 MED ORDER — ONDANSETRON HCL 4 MG/2ML IJ SOLN: 4.0000 mg | Freq: Once | INTRAMUSCULAR | Status: DC | PRN

## 2013-03-15 MED ORDER — PHENYLEPHRINE HCL 2.5 % OP SOLN
1.0000 [drp] | OPHTHALMIC | Status: AC | PRN
  Administered 2013-03-15 (×3): 1 [drp] via OPHTHALMIC
  Filled 2013-03-15: qty 2

## 2013-03-15 MED ORDER — BSS PLUS IO SOLN
INTRAOCULAR | Status: DC | PRN
  Administered 2013-03-15: 1 via INTRAOCULAR

## 2013-03-15 MED ORDER — BACITRACIN-POLYMYXIN B 500-10000 UNIT/GM OP OINT
TOPICAL_OINTMENT | OPHTHALMIC | Status: AC
  Filled 2013-03-15: qty 3.5

## 2013-03-15 MED ORDER — BSS IO SOLN
INTRAOCULAR | Status: AC
  Filled 2013-03-15: qty 15

## 2013-03-15 MED ORDER — DEXAMETHASONE SODIUM PHOSPHATE 10 MG/ML IJ SOLN
INTRAMUSCULAR | Status: DC | PRN
  Administered 2013-03-15: 5 mg via INTRAVENOUS

## 2013-03-15 MED ORDER — ONDANSETRON HCL 4 MG/2ML IJ SOLN
INTRAMUSCULAR | Status: DC | PRN
  Administered 2013-03-15: 4 mg via INTRAVENOUS

## 2013-03-15 MED ORDER — EPINEPHRINE HCL 1 MG/ML IJ SOLN
INTRAMUSCULAR | Status: AC
  Filled 2013-03-15: qty 1

## 2013-03-15 MED ORDER — ACETAZOLAMIDE SODIUM 500 MG IJ SOLR
INTRAMUSCULAR | Status: AC
  Filled 2013-03-15: qty 500

## 2013-03-15 MED ORDER — BUPIVACAINE HCL (PF) 0.75 % IJ SOLN
INTRAMUSCULAR | Status: DC | PRN
  Administered 2013-03-15: 10 mL

## 2013-03-15 MED ORDER — ACETAZOLAMIDE SODIUM 500 MG IJ SOLR
INTRAMUSCULAR | Status: DC | PRN
  Administered 2013-03-15: 500 mg via INTRAVENOUS

## 2013-03-15 MED ORDER — FENTANYL CITRATE 0.05 MG/ML IJ SOLN
INTRAMUSCULAR | Status: DC | PRN
  Administered 2013-03-15: 50 ug via INTRAVENOUS
  Administered 2013-03-15: 100 ug via INTRAVENOUS

## 2013-03-15 SURGICAL SUPPLY — 65 items
APL SRG 3 HI ABS STRL LF PLS (MISCELLANEOUS) ×1
APPLICATOR DR MATTHEWS STRL (MISCELLANEOUS) ×1 IMPLANT
BAND SCLERAL BUCKLING TYPE 42 (Ophthalmic Related) ×1 IMPLANT
BLADE EYE MINI 60D BEAVER (BLADE) ×2 IMPLANT
BLADE MINI BENT 60 DEGREE (BLADE) ×2 IMPLANT
BLADE MVR KNIFE 19G (BLADE) ×2 IMPLANT
BLADE STAB KNIFE 15DEG (BLADE) ×2 IMPLANT
CANNULA DUAL BORE 23G (CANNULA) ×1 IMPLANT
CANNULA FLEX TIP 23G (CANNULA) ×1 IMPLANT
CANNULA INFUSION 4 (MISCELLANEOUS) IMPLANT
CLOTH BEACON ORANGE TIMEOUT ST (SAFETY) ×1 IMPLANT
CORDS BIPOLAR (ELECTRODE) ×2 IMPLANT
DRAPE OPHTHALMIC 77X100 STRL (CUSTOM PROCEDURE TRAY) ×2 IMPLANT
DRAPE POUCH INSTRU U-SHP 10X18 (DRAPES) ×2 IMPLANT
DRSG TEGADERM 4X4.75 (GAUZE/BANDAGES/DRESSINGS) ×2 IMPLANT
FILTER BLUE MILLIPORE (MISCELLANEOUS) ×1 IMPLANT
GAS OPHTHALMIC (MISCELLANEOUS) ×1 IMPLANT
GLOVE SS BIOGEL STRL SZ 6.5 (GLOVE) ×2 IMPLANT
GLOVE SUPERSENSE BIOGEL SZ 6.5 (GLOVE) ×2
GOWN STRL NON-REIN LRG LVL3 (GOWN DISPOSABLE) ×4 IMPLANT
ILLUMINATOR ENDO 25GA (MISCELLANEOUS) ×2 IMPLANT
KIT ROOM TURNOVER OR (KITS) ×2 IMPLANT
KNIFE CRESCENT 1.75 EDGEAHEAD (BLADE) ×2 IMPLANT
KNIFE GRIESHABER SHARP 2.5MM (MISCELLANEOUS) ×2 IMPLANT
MARKER SKIN DUAL TIP RULER LAB (MISCELLANEOUS) ×2 IMPLANT
MASK EYE SHIELD (GAUZE/BANDAGES/DRESSINGS) ×1 IMPLANT
NDL 18GX1X1/2 (RX/OR ONLY) (NEEDLE) ×1 IMPLANT
NDL 25GX 5/8IN NON SAFETY (NEEDLE) ×1 IMPLANT
NDL HYPO 30X.5 LL (NEEDLE) ×4 IMPLANT
NEEDLE 18GX1X1/2 (RX/OR ONLY) (NEEDLE) ×2 IMPLANT
NEEDLE 22X1 1/2 (OR ONLY) (NEEDLE) ×2 IMPLANT
NEEDLE 25GX 5/8IN NON SAFETY (NEEDLE) ×2 IMPLANT
NEEDLE HYPO 30X.5 LL (NEEDLE) ×2 IMPLANT
NS IRRIG 1000ML POUR BTL (IV SOLUTION) ×2 IMPLANT
PACK VITRECTOMY CUSTOM (CUSTOM PROCEDURE TRAY) ×2 IMPLANT
PACK VITRECTOMY PIC MCHSVP (PACKS) ×1 IMPLANT
PAD ARMBOARD 7.5X6 YLW CONV (MISCELLANEOUS) ×4 IMPLANT
PAD EYE OVAL STERILE LF (GAUZE/BANDAGES/DRESSINGS) ×1 IMPLANT
PAK VITRECTOMY PIK  23GA (OPHTHALMIC RELATED) ×2 IMPLANT
PENCIL BIPOLAR 25GA STR DISP (OPHTHALMIC RELATED) ×1 IMPLANT
PROBE LASER ILLUM FLEX CVD 23G (OPHTHALMIC) ×1 IMPLANT
ROLLS DENTAL (MISCELLANEOUS) ×4 IMPLANT
SCRAPER DIAMOND 25GA (OPHTHALMIC RELATED) ×1 IMPLANT
SCRAPER DIAMOND DUST MEMBRANE (MISCELLANEOUS) IMPLANT
SET VGFI TUBING 8065808002 (SET/KITS/TRAYS/PACK) ×1 IMPLANT
SOLUTION ANTI FOG 6CC (MISCELLANEOUS) ×2 IMPLANT
SPEAR EYE SURG WECK-CEL (MISCELLANEOUS) ×5 IMPLANT
SUT ETHILON 10 0 CS140 6 (SUTURE) IMPLANT
SUT ETHILON 4 0 P 3 18 (SUTURE) ×1 IMPLANT
SUT ETHILON 5 0 P 3 18 (SUTURE)
SUT ETHILON 9 0 TG140 8 (SUTURE) ×1 IMPLANT
SUT NYLON ETHILON 5-0 P-3 1X18 (SUTURE) IMPLANT
SUT PLAIN 6 0 TG1408 (SUTURE) ×2 IMPLANT
SUT POLY NON ABSORB 10-0 8 STR (SUTURE) IMPLANT
SUT VICRYL 7 0 TG140 8 (SUTURE) IMPLANT
SUT VICRYL ABS 6-0 S29 18IN (SUTURE) ×2 IMPLANT
SYR 20CC LL (SYRINGE) ×2 IMPLANT
SYR 5ML LL (SYRINGE) IMPLANT
SYR TB 1ML LUER SLIP (SYRINGE) ×2 IMPLANT
SYRINGE 10CC LL (SYRINGE) IMPLANT
TOWEL OR 17X24 6PK STRL BLUE (TOWEL DISPOSABLE) ×6 IMPLANT
TUBING HIGH PRESS EXTEN 6IN (TUBING) ×1 IMPLANT
WATER STERILE IRR 1000ML POUR (IV SOLUTION) ×2 IMPLANT
WIPE INSTRUMENT ADHESIVE BACK (MISCELLANEOUS) ×2 IMPLANT
WIPE INSTRUMENT VISIWIPE 73X73 (MISCELLANEOUS) ×1 IMPLANT

## 2013-03-15 NOTE — Op Note (Signed)
[name]  03/15/2013  Rhegmatogenous Retina Detachment Right Eye  Procedure: Pars Plana Vitrectomy, Membrane Peeling, Endolaser, Fluid Gas Exchange, Scleral Buckle, Right Eye  Surgeon: Shade Flood Estimated Blood Loss: minimal Specimens for Pathology:  None Complications: none   The  patient was prepped and draped in the usual fashion for ocular surgery on the  right eye .  A solid lid speculum was placed.  Conjunctival peritomies were placed at the 9:30 and 2:30 meridians and the conjunctiva was severed from the corneal limbus for 360 degrees. Blunt dissection of the intermuscular septa was achieved with the tenotomy scissors. A cotton tipped applicator was used to strip tenon's from each rectus muscle. The Graeffe muscle hook was used to isolate each rectus muscule. The Gass threaded muscle hook was then used to place a 2-0 silk ligature around each rectus muscle. The sclera was cleaned and cauterized. The cautery was used to demarcate sleral marks for creating scleral belk loops at the equator in each quadrant. The belt loops were located that posteriorly as the patient had posteriorly located areas of lattice which I wished to support with the buckle. A Grieshaber 0.1 blade was then used to place radial grroves in the sclera to facilitate dissection of the belt loops in each quadrant. The [place] 6600 blade was used to dissect the tunnel through the sclera. The Deaver dissector was used to complete each belt loop. The #42 band was used to encircle the eye using the belt loops in each quadrant after placing the buckle element beneath each rectus muscle. The overlap was placed in the superotemporal quadrant. A 2-0 nylon was used as a ligature. A syringe was used to remove vitreous air to lower the IOP.[name] The buckle was brought to moderate height and secured with the ligature. The sutures were fixed to the drape and then the infusion trocar was placed at the 7:30 meridian. The tip was visualized  in the vitreous cavity. The trocar was removed and the infusion line attached with the line clamped. Additional trocar cannulas were placed at 9:30 and 2:30. The light pipe was inserted along with the vitreous cutter.    The vitreous cutter was used to remove any vitreous at  the anterior retina and vitreous base. Care was taken to remove any residual viterous at the vitreous base was carefully dissected/shaved flush. The buckle was well placed at moderate height supporting the anterior-superior break. Perfluoron Liquid was used to displace the sub-retinal fluid from beneath the retina and anchor the posterior retina during anterior vitreous dissection. An endocautery was used to mark the superior retina at the 12:00 and 10:30 so that the retinal breaks will be able to be localized once the retina was attached.  Total air/fluid exchange was then carried out. The Perfluron was removed from the pre-macular area with the silicone tipped cannula. Endolaser was applied over the buckle crest and surrounding all retina breaks and inferiorly where pigmented lattice was noted.The infusion line was used to infuse 10% C3F8 gas mixed by the surgeon. A 26 g needle was used to facilitate egress from the cannula at 9:30. The remaining cannulas were removed and the scleral openings  were closed with 7-0 nylon sutures. The conjunctiva was  reapproximated with 6-0 Plain Gut suture. The sub-conjunctival space was irrigated with 4mg  of Decadron solution and 25 mg of Vancomycin.  Shade Flood, MD

## 2013-03-15 NOTE — Preoperative (Signed)
Beta Blockers   Reason not to administer Beta Blockers:Not Applicable 

## 2013-03-15 NOTE — Anesthesia Preprocedure Evaluation (Addendum)
Anesthesia Evaluation  Patient identified by MRN, date of birth, ID band Patient awake    Reviewed: Allergy & Precautions, H&P , NPO status , Patient's Chart, lab work & pertinent test results  Airway Mallampati: III      Dental  (+) Edentulous Upper   Pulmonary  breath sounds clear to auscultation        Cardiovascular hypertension, Pt. on medications + Valvular Problems/Murmurs AS Rhythm:Regular Rate:Normal     Neuro/Psych    GI/Hepatic GERD-  Medicated and Controlled,  Endo/Other    Renal/GU      Musculoskeletal   Abdominal (+) + obese,   Peds  Hematology   Anesthesia Other Findings   Reproductive/Obstetrics                          Anesthesia Physical Anesthesia Plan  ASA: III  Anesthesia Plan: General   Post-op Pain Management:    Induction: Intravenous  Airway Management Planned: Oral ETT  Additional Equipment:   Intra-op Plan:   Post-operative Plan: Extubation in OR  Informed Consent: I have reviewed the patients History and Physical, chart, labs and discussed the procedure including the risks, benefits and alternatives for the proposed anesthesia with the patient or authorized representative who has indicated his/her understanding and acceptance.     Plan Discussed with: CRNA and Anesthesiologist  Anesthesia Plan Comments: (R. Retinal Detachment Htn Obesity GERD  Plan GA with oral ETT  Kipp Brood, MD)        Anesthesia Quick Evaluation

## 2013-03-15 NOTE — Anesthesia Postprocedure Evaluation (Signed)
  Anesthesia Post-op Note  Patient: [name]  Procedure(s) Performed: Procedure(s): SCLERAL BUCKLE with 23Ga Vit (Right)  Patient Location: PACU  Anesthesia Type:General  Level of Consciousness: awake, alert  and oriented  Airway and Oxygen Therapy: Patient Spontanous Breathing  Post-op Pain: mild  Post-op Assessment: Post-op Vital signs reviewed, Patient's Cardiovascular Status Stable, Respiratory Function Stable, Patent Airway and Pain level controlled  Post-op Vital Signs: stable  Complications: No apparent anesthesia complications

## 2013-03-15 NOTE — Transfer of Care (Signed)
Immediate Anesthesia Transfer of Care Note  Patient: [name]  Procedure(s) Performed: Procedure(s): SCLERAL BUCKLE with 23Ga Vit (Right)  Patient Location: PACU  Anesthesia Type:General  Level of Consciousness: awake  Airway & Oxygen Therapy: Patient Spontanous Breathing and Patient connected to face mask oxygen  Post-op Assessment: Report given to PACU RN, Post -op Vital signs reviewed and stable and Patient moving all extremities X 4  Post vital signs: Reviewed and stable  Complications: No apparent anesthesia complications

## 2013-03-15 NOTE — OR Nursing (Signed)
Patient dentures brought with patient to OR.cdb

## 2013-03-15 NOTE — Anesthesia Procedure Notes (Addendum)
Procedure Name: Intubation Date/Time: 03/15/2013 7:58 AM Performed by: Quentin Ore Pre-anesthesia Checklist: Patient identified, Emergency Drugs available, Suction available, Patient being monitored and Timeout performed Patient Re-evaluated:Patient Re-evaluated prior to inductionOxygen Delivery Method: Circle system utilized Preoxygenation: Pre-oxygenation with 100% oxygen Intubation Type: IV induction Ventilation: Oral airway inserted - appropriate to patient size, Two handed mask ventilation required and Mask ventilation without difficulty Tube type: Oral Tube size: 7.5 mm Number of attempts: 2 Airway Equipment and Method: Stylet and Video-laryngoscopy Placement Confirmation: ETT inserted through vocal cords under direct vision,  positive ETCO2 and breath sounds checked- equal and bilateral Secured at: 23 cm Tube secured with: Tape Dental Injury: Teeth and Oropharynx as per pre-operative assessment  Comments: DL x1 with MAC 4 unable to visualize vocal cords. DL with glidescope, Grade I view. AOI with glidescope. +ETCO2 and BBS=.

## 2013-03-18 ENCOUNTER — Encounter: Payer: Self-pay | Admitting: Internal Medicine

## 2013-03-18 ENCOUNTER — Encounter (HOSPITAL_COMMUNITY): Payer: Self-pay | Admitting: Ophthalmology

## 2013-03-18 DIAGNOSIS — H33001 Unspecified retinal detachment with retinal break, right eye: Secondary | ICD-10-CM | POA: Insufficient documentation

## 2013-03-18 HISTORY — DX: Unspecified retinal detachment with retinal break, right eye: H33.001

## 2013-04-04 ENCOUNTER — Other Ambulatory Visit: Payer: Self-pay | Admitting: *Deleted

## 2013-04-04 DIAGNOSIS — M48062 Spinal stenosis, lumbar region with neurogenic claudication: Secondary | ICD-10-CM

## 2013-04-04 MED ORDER — TRAMADOL HCL 50 MG PO TABS: 50.0000 mg | ORAL_TABLET | Freq: Three times a day (TID) | ORAL | Status: DC | PRN

## 2013-04-21 DIAGNOSIS — Z23 Encounter for immunization: Secondary | ICD-10-CM | POA: Diagnosis not present

## 2013-04-25 ENCOUNTER — Encounter: Payer: Self-pay | Admitting: Internal Medicine

## 2013-04-25 ENCOUNTER — Ambulatory Visit (INDEPENDENT_AMBULATORY_CARE_PROVIDER_SITE_OTHER): Payer: Medicare Other | Admitting: Internal Medicine

## 2013-04-25 VITALS — BP 130/73 | HR 74 | Temp 98.0°F | Wt 281.0 lb

## 2013-04-25 DIAGNOSIS — I10 Essential (primary) hypertension: Secondary | ICD-10-CM | POA: Diagnosis not present

## 2013-04-25 DIAGNOSIS — R1310 Dysphagia, unspecified: Secondary | ICD-10-CM

## 2013-04-25 DIAGNOSIS — E538 Deficiency of other specified B group vitamins: Secondary | ICD-10-CM

## 2013-04-25 DIAGNOSIS — M48062 Spinal stenosis, lumbar region with neurogenic claudication: Secondary | ICD-10-CM

## 2013-04-25 DIAGNOSIS — Z23 Encounter for immunization: Secondary | ICD-10-CM | POA: Diagnosis not present

## 2013-04-25 DIAGNOSIS — K219 Gastro-esophageal reflux disease without esophagitis: Secondary | ICD-10-CM | POA: Diagnosis not present

## 2013-04-25 DIAGNOSIS — M1990 Unspecified osteoarthritis, unspecified site: Secondary | ICD-10-CM | POA: Diagnosis not present

## 2013-04-25 LAB — VITAMIN B12: Vitamin B-12: 382 pg/mL (ref 211–911)

## 2013-04-25 LAB — LIPID PANEL
Cholesterol: 181 mg/dL (ref 0–200)
LDL Cholesterol: 104 mg/dL — ABNORMAL HIGH (ref 0–99)
Total CHOL/HDL Ratio: 3.9 Ratio

## 2013-04-25 MED ORDER — VITAMIN B-12 1000 MCG PO TABS: 1000.0000 ug | ORAL_TABLET | Freq: Every day | ORAL | Status: DC

## 2013-04-25 MED ORDER — TRAMADOL HCL 50 MG PO TABS: 100.0000 mg | ORAL_TABLET | Freq: Three times a day (TID) | ORAL | Status: DC | PRN

## 2013-04-25 NOTE — Patient Instructions (Signed)
It was great to see you again.  You are taking good care of yourself.  1) I ordered more tramadol tablets for your pain.  Please take 2 tablets (100 mg) by mouth every 8 hours as needed for pain.  2) I will order a swallowing study to look at why you may have trouble swallowing.  We can then better decide what to do after that.  3) Keep taking all of your other medications as you are.  4) I will call if your cholesterol and B12 blood tests are not good.  I will see you back in 6 months, sooner if necessary.

## 2013-04-27 ENCOUNTER — Encounter: Payer: Self-pay | Admitting: Internal Medicine

## 2013-04-27 DIAGNOSIS — Z0000 Encounter for general adult medical examination without abnormal findings: Secondary | ICD-10-CM | POA: Insufficient documentation

## 2013-04-27 NOTE — Assessment & Plan Note (Signed)
His right knee pain continues to be problematic.  He states that the tramadol is effective but the effect does not last long.  We will increase the tramadol to 100 mg by mouth every 8 hours as needed for pain and reassess his symptoms at the follow-up visit.

## 2013-04-27 NOTE — Progress Notes (Signed)
  Subjective:    Patient ID: [name], male    DOB: 1945/10/09, 67 y.o.   MRN: [id number]  HPI  Please see the A&P for the status of the pt's chronic medical problems.  Review of Systems  Constitutional: Negative for unexpected weight change.  HENT: Positive for trouble swallowing. Negative for sore throat and voice change.        Difficulty swallowing medication and solids.  No difficulty with liquids.  Eyes: Positive for redness and visual disturbance.       Poor vision in right eye despite surgery for retinal detachment.  Respiratory: Negative for cough and choking.   Cardiovascular: Negative for leg swelling.  Gastrointestinal: Negative for nausea, vomiting, abdominal pain and abdominal distention.  Musculoskeletal: Positive for arthralgias, back pain, gait problem and myalgias. Negative for joint swelling.      Objective:   Physical Exam  Nursing note and vitals reviewed. Constitutional: He is oriented to person, place, and time. He appears well-developed and well-nourished. No distress.  HENT:  Head: Normocephalic and atraumatic.  Eyes: Right eye exhibits no discharge. Left eye exhibits no discharge. No scleral icterus.  Right eye still red.  Musculoskeletal: Normal range of motion. He exhibits no edema and no tenderness.  Neurological: He is alert and oriented to person, place, and time. He exhibits normal muscle tone.  Skin: Skin is warm and dry. No rash noted. He is not diaphoretic. No erythema.  Psychiatric: He has a normal mood and affect. His behavior is normal. Judgment and thought content normal.      Assessment & Plan:   Please see problem oriented charting.  Total cholesterol 181 Triglycerides 156 HDL 46 LDL 104  B12 382

## 2013-04-27 NOTE — Assessment & Plan Note (Signed)
His blood pressure is well controlled on the amlodipine 10 mg daily and lisinopril-HCTZ 40-25 mg daily.  He is also tolerating this regimen well.  We will continue this current therapy.

## 2013-04-27 NOTE — Assessment & Plan Note (Signed)
He has occasional burning in the med chest consistent with his reflux despite the omeprazole 40 mg daily.  This is not often so we will continue the omeprazole at the current dose, although if he is found to have an intrinsic stricture of the esophagus as we work up his swallowing difficulty we may need to escalate this therapy.

## 2013-04-27 NOTE — Assessment & Plan Note (Signed)
Fells he has trouble getting pills and food down.  States that he has to make several swallowing attempts to get anything down (except water) and sometimes it will come right back up.  He feels things get stuck in his throat.  He denies any sore throat, fevers, shakes, chills, post nasal drip, or voice change.  The symptoms have been going on for 3 months and are not associated with any other new neurologic complaints.  Will obtain a barium swallow to asses for stricture given history of reflux.  If negative will refer to Speech Therapy to assess for swallowing dysfunction which could suggest an underlying neurologic disorder or event.

## 2013-04-27 NOTE — Assessment & Plan Note (Addendum)
He states he is continuing to take the oral B12.  B12 level today was 382 which is up from 200 one year ago.  We will continue the oral B12 therapy.

## 2013-04-27 NOTE — Assessment & Plan Note (Signed)
He received his final pneumococcal vaccination today.  He states he received his flu shot this year at his assisted living facility.  We will try to get the documentation.  We will discuss the zostavax at the follow-up visit.

## 2013-04-27 NOTE — Assessment & Plan Note (Signed)
His symptoms are reasonably well controlled on the gabapentin and amitriptyline.  We will continue this therapy.

## 2013-05-02 ENCOUNTER — Ambulatory Visit (HOSPITAL_COMMUNITY)
Admission: RE | Admit: 2013-05-02 | Discharge: 2013-05-02 | Disposition: A | Discharge status: Home / Self Care | Payer: Medicare Other | Source: Ambulatory Visit | Attending: Internal Medicine | Admitting: Internal Medicine

## 2013-05-02 DIAGNOSIS — R1310 Dysphagia, unspecified: Secondary | ICD-10-CM | POA: Diagnosis not present

## 2013-05-02 DIAGNOSIS — K222 Esophageal obstruction: Secondary | ICD-10-CM | POA: Diagnosis not present

## 2013-05-05 ENCOUNTER — Other Ambulatory Visit: Payer: Self-pay | Admitting: Internal Medicine

## 2013-05-05 DIAGNOSIS — K220 Achalasia of cardia: Secondary | ICD-10-CM

## 2013-05-05 DIAGNOSIS — K219 Gastro-esophageal reflux disease without esophagitis: Secondary | ICD-10-CM

## 2013-05-05 HISTORY — DX: Achalasia of cardia: K22.0

## 2013-05-19 ENCOUNTER — Encounter: Payer: Self-pay | Admitting: *Deleted

## 2013-05-26 ENCOUNTER — Encounter: Payer: Self-pay | Admitting: Gastroenterology

## 2013-05-27 ENCOUNTER — Ambulatory Visit: Payer: Self-pay

## 2013-06-23 ENCOUNTER — Emergency Department (HOSPITAL_COMMUNITY): Payer: Medicare Other

## 2013-06-23 ENCOUNTER — Telehealth: Payer: Self-pay | Admitting: *Deleted

## 2013-06-23 ENCOUNTER — Encounter (HOSPITAL_COMMUNITY): Payer: Self-pay | Admitting: Emergency Medicine

## 2013-06-23 ENCOUNTER — Emergency Department (HOSPITAL_COMMUNITY)
Admission: EM | Admit: 2013-06-23 | Discharge: 2013-06-23 | Disposition: A | Discharge status: Home / Self Care | Payer: Medicare Other | Attending: Emergency Medicine | Admitting: Emergency Medicine

## 2013-06-23 DIAGNOSIS — I119 Hypertensive heart disease without heart failure: Secondary | ICD-10-CM | POA: Insufficient documentation

## 2013-06-23 DIAGNOSIS — Z87828 Personal history of other (healed) physical injury and trauma: Secondary | ICD-10-CM | POA: Diagnosis not present

## 2013-06-23 DIAGNOSIS — Z79899 Other long term (current) drug therapy: Secondary | ICD-10-CM | POA: Diagnosis not present

## 2013-06-23 DIAGNOSIS — K219 Gastro-esophageal reflux disease without esophagitis: Secondary | ICD-10-CM | POA: Insufficient documentation

## 2013-06-23 DIAGNOSIS — M25519 Pain in unspecified shoulder: Secondary | ICD-10-CM | POA: Diagnosis not present

## 2013-06-23 DIAGNOSIS — G8929 Other chronic pain: Secondary | ICD-10-CM | POA: Diagnosis not present

## 2013-06-23 DIAGNOSIS — M1710 Unilateral primary osteoarthritis, unspecified knee: Secondary | ICD-10-CM | POA: Insufficient documentation

## 2013-06-23 DIAGNOSIS — Z7982 Long term (current) use of aspirin: Secondary | ICD-10-CM | POA: Insufficient documentation

## 2013-06-23 DIAGNOSIS — IMO0002 Reserved for concepts with insufficient information to code with codable children: Secondary | ICD-10-CM | POA: Diagnosis not present

## 2013-06-23 DIAGNOSIS — Z87891 Personal history of nicotine dependence: Secondary | ICD-10-CM | POA: Insufficient documentation

## 2013-06-23 DIAGNOSIS — M1610 Unilateral primary osteoarthritis, unspecified hip: Secondary | ICD-10-CM | POA: Insufficient documentation

## 2013-06-23 DIAGNOSIS — Z9889 Other specified postprocedural states: Secondary | ICD-10-CM | POA: Diagnosis not present

## 2013-06-23 DIAGNOSIS — M79609 Pain in unspecified limb: Secondary | ICD-10-CM | POA: Insufficient documentation

## 2013-06-23 DIAGNOSIS — M19019 Primary osteoarthritis, unspecified shoulder: Secondary | ICD-10-CM | POA: Diagnosis not present

## 2013-06-23 DIAGNOSIS — M25512 Pain in left shoulder: Secondary | ICD-10-CM

## 2013-06-23 DIAGNOSIS — M169 Osteoarthritis of hip, unspecified: Secondary | ICD-10-CM | POA: Insufficient documentation

## 2013-06-23 MED ORDER — HYDROCODONE-ACETAMINOPHEN 5-325 MG PO TABS
1.0000 | ORAL_TABLET | Freq: Once | ORAL | Status: AC
  Administered 2013-06-23: 1 via ORAL
  Filled 2013-06-23: qty 1

## 2013-06-23 MED ORDER — HYDROCODONE-ACETAMINOPHEN 5-325 MG PO TABS: 1.0000 | ORAL_TABLET | Freq: Four times a day (QID) | ORAL | Status: DC | PRN

## 2013-06-23 NOTE — Telephone Encounter (Signed)
Pt called, we do not have any appointments available today, so pt advised to go to ED for evaluation. Pt does not want to go to ED but I told him Dr Kem Kays does not want him to wait until tomorrow to be seen. He needs evaluation today. Pt voices understanding.

## 2013-06-23 NOTE — ED Notes (Signed)
Pt. Stated, i started having left arm and leg pain 5 days ago.
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
## 2013-06-23 NOTE — Telephone Encounter (Signed)
Pt called with c/o pain on left side from shoulder to left leg.  He will wake up and shoulder is numb, he needs to use rt arm to move the left hand.  Sometimes when he walks left leg feels like something is biting him.  Today he feels better, these complaints are from yesterday.  Onset of pain is about 3 weeks ago, shoulder and arm numbness is just when he gets up in AM.   Pt is ambulating and eating well.  Pt # [id number] He has a scheduled appointment 1/9 with PCP about this problem. He wants to know if he needs to come in sooner.  Please advise

## 2013-06-23 NOTE — Telephone Encounter (Signed)
Called [name] to discuss his symptoms. He states he's been having intermittent weakness of LUE and LLE that started about a week ago. He denies any falls. He states his strength is fine now, but at times feels like something is crawling on his LLE. I would have him seen in clinic today, if not, please ask him to go to the ED, he may be having TIA's. Thanks.

## 2013-06-25 ENCOUNTER — Other Ambulatory Visit: Payer: Self-pay | Admitting: Ophthalmology

## 2013-06-25 NOTE — H&P (Signed)
Patient Record  [name] Patient Number:  [id number] Date of Birth:  July 11, 1945 Age:  67 years old    Gender:  Male Date of Evaluation:  June 25, 2013  Chief Complaint:   67 year old male is referred with a 2 weeks history of decreased vision and an RD OD.  History of Present Illness:   Post PPV, SB, FGX, EL OD by Dr. Geige 9/14. He had Phaco IOL OD  by Dr. Cimberly [name] x 5/13; os 11/13.    No pain or discomfort. On no meds.   Presents for evaluation.  Uses pred forte occaisionally.( Reviewed by Doctor: [name] Past History:  Allergies:  nkda, Active Medications:   Ocular Medications:  Combigan 1 gtt OD Q12 hrs Lumigan 1 gtt OD QHS Other Medications:  Hydrochlorothiazide 25mg daily, Gabapentin 300mg 2 caps. TID, Norvasc 10mg daily, Omeprazole 40mg daily, Lisinopril 40mg daily Birth History:  none Past Ocular History:   History of Chronic Iritis OU Combined Cataracts OU Past Medical History:   Hypertension, Arthritis Past Surgical History:   rotator cuff surgery Family History:  no amblyopia, + blindness (brother), + cataracts (mother), no crossed eyes, no diabetic retinopathy, no glaucoma, no macular degeneration, no retinal detachment, no cancer, + diabetes (sister, brother), no heart disease, + high blood pressure (brother, sister), no stroke Social History:   Smoking Status: former smoker  Alcohol:  none   Driving status:  not driving Marital status:  widowed Review of Systems:   Eyes: + blurred vision, + pain, + tearing   Ear/Nose/Throat: + hearing loss  Musculoskeletal: + low back pain, + arthritis,   Neurological: + weakness  All other systems are negative.  Examination:  Visual Acuity:   Distance VA [place]:  OD: 20/400    OS: 20/50 IOP:  OD:  02    @ 03:42PM (Goldmann applanation) Manifest Refraction:    Sphere    Cyl Axis       VA         Add       VA Prism Base R:  -0.75  -1.25  165    20/30       +2.50                      L:  -0.50               20/100        +2.50                        Pupils:  OU:  Shape, size, direct and consensual reaction normal  Adnexa:  Preauricular LN, lacrimal drainage, lacrimal glands, orbit normal  Eyelids:  Eyelids:  normal Conjunctiva:  OU:  non-injected  Cornea:  OU:  arcus, mild SPK, pigmented guttata OU, old  Anterior Chamber:  OU:  3+ Shawn / clear  Iris:  OD:  some atrophy of anterior stroma, max dilation at 5mm OU:  normal, rubeosis absent Dilation:  OD: Tropicamide 1%/ N 2.5% @ 04:11PM  Lens:  OU:  PC IOL in good position  Vitreous:  OD:  post PPV OS:  post PPV  Optic Disc:  OD:  cupping: 0.35 OS:  cupping: 0.35  Macula:  OD:  detached OS:  normal  Vessels:  OU:  normal  Periphery:  OD:  PVR OS:  normal    Orientation to person, place and time:  Normal  Mood and affect:    Normal  Impression:  361.01  Retinal Detachment, partial, 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 The membranes were caused by his previous retinal tears OD. Cells gained entry into the vitreous cavity when he developed his tears in September. Those cellular elements have proliferated causing traction on the retina which has caused the recurrent detachment.  I have recommended vitrectomy, membrane peeling, FGX, EL OD, possible silicone oil pending his findings. He already has a Scleral Buckle.  He indicated understanding our discussion and felt that his questions had been answered to his satisfaction.  He indicates that he desires to proceed with the recommended treatment/care plan.   Medications:   Hold : Combigan 1 gtt OD Q12 rs WA  Lumigan OD QHS Continmue: Pred Forte  (prednisolone acetate) drops,suspension 1% Apply 1 drop into right eye four times a day, 10 ml, Refills:1, Substitution Permitted:y [facility] [phone] Patient Instructions:  Please do not eat anything after mignight the day before surgery. Return to clinic:   07/03/2013, 4:00 PM for post-operative follow-up   Schedule:  Pars Plana, Vitrectomy, Membrane Peeling, Endolaser, Fluid Gas Exchange, possible  Silicone Oil , OD x 07/02/2013.   (electronically signed)  Shawn Bonawitz, MD   

## 2013-06-26 NOTE — ED Provider Notes (Signed)
Medical screening examination/treatment/procedure(s) were performed by non-physician practitioner and as supervising physician I was immediately available for consultation/collaboration.  EKG Interpretation   None         Hoy Morn, MD 06/26/13 [phone]

## 2013-06-30 ENCOUNTER — Ambulatory Visit (INDEPENDENT_AMBULATORY_CARE_PROVIDER_SITE_OTHER): Payer: Medicare Other | Admitting: Gastroenterology

## 2013-06-30 ENCOUNTER — Encounter (HOSPITAL_COMMUNITY): Payer: Self-pay | Admitting: Respiratory Therapy

## 2013-06-30 ENCOUNTER — Encounter: Payer: Self-pay | Admitting: Gastroenterology

## 2013-06-30 VITALS — BP 110/74 | HR 80 | Ht 74.0 in | Wt 282.0 lb

## 2013-06-30 DIAGNOSIS — I359 Nonrheumatic aortic valve disorder, unspecified: Secondary | ICD-10-CM

## 2013-06-30 DIAGNOSIS — K220 Achalasia of cardia: Secondary | ICD-10-CM

## 2013-06-30 DIAGNOSIS — Z800 Family history of malignant neoplasm of digestive organs: Secondary | ICD-10-CM | POA: Insufficient documentation

## 2013-06-30 DIAGNOSIS — I350 Nonrheumatic aortic (valve) stenosis: Secondary | ICD-10-CM

## 2013-06-30 HISTORY — DX: Family history of malignant neoplasm of digestive organs: Z80.0

## 2013-06-30 NOTE — Addendum Note (Signed)
Addended by: Oda Kilts on: 06/30/2013 04:52 PM   Modules accepted: Orders

## 2013-06-30 NOTE — Assessment & Plan Note (Signed)
Plan followup colonoscopy 

## 2013-06-30 NOTE — Assessment & Plan Note (Signed)
Asymptomatic at present.

## 2013-06-30 NOTE — Assessment & Plan Note (Signed)
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  Recommendations #1 upper endoscopy

## 2013-06-30 NOTE — Progress Notes (Signed)
_                                                                                                                History of Present Illness: 68 year old Afro-American male with history of hypertension, aortic stenosis, lumbar stenosis with neurogenic claudication, referred for evaluation of dysphagia.  Patient primarily has difficulty swallowing pills.  He also complains of dysphagia to solids and, to a lesser extent, liquids.  He may immediately vomit up food contents.  Barium esophagram, which I reviewed, demonstrated a patulous esophagus and a bird peak narrowing at the distal esophagus suggestive of achalasia.  He denies chest pain or pyrosis.  Patient has a family history of colon cancer.  Last colonoscopy was 2009 that demonstrated hyperplastic polyps.    Past Medical History  Diagnosis Date  . Essential hypertension 06/15/2006  . 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 Mild aortic valve stenosis 10/09/2006    Echo (12/09/2009): Valve area: 2.03 cm2   . Lumbar stenosis with neurogenic claudication 06/16/2006    Moderate: L2 through L4.  Complicated by chronic low back pain and neuropathy.  Nerve conduction study (10/19/11): Diffusely low motor amplitudes, with primarily involvement of the peroneal and posterior tibial nerves. No evidence of generalized peripheral neuropathy. Findings could be c/w bil multilevel lumbosacral radiculopathies or a primary motor neuropathy.   . Osteoarthritis 11/25/2009    Right knee (medial compartment), right hip, left shoulder   . Obesity, Class II, BMI 35-39.9, with comorbidity 04/05/2012  . Gastroesophageal reflux disease 02/04/2007  . B12 deficiency 10/01/2011    Discovered to 2 progressive neurologic pain and dysfunction.  Diagnosis established March 2013.  Requires the following treatment: B12 IM monthly until level normalizes. After that, oral supplementation.   .  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 Cataract    Past Surgical History  Procedure Laterality Date  . Cataract extraction w/phaco  11/08/2011    Procedure: CATARACT EXTRACTION PHACO AND INTRAOCULAR LENS PLACEMENT (IOC);  Surgeon: Adonis Brook, MD;  Location: [place];  Service: Ophthalmology;  Laterality: Right;  . Fracture surgery      left shoulder  . Eye surgery      cat ext right  . Cataract extraction  05/01/2012    left eye  . Cataract extraction w/phaco  05/01/2012    Procedure: CATARACT EXTRACTION PHACO AND INTRAOCULAR LENS PLACEMENT (IOC);  Surgeon: Adonis Brook, MD;  Location: [facility];  Service: Ophthalmology;  Laterality: Left;  . Colonscopy    . Scleral buckle Right 03/15/2013    Procedure: SCLERAL BUCKLE with 23Ga Vit;  Surgeon: Adonis Brook, MD;  Location: [place];  Service: Ophthalmology;  Laterality: Right;   family history includes Colon cancer (age of onset: 47) in his father; Diabetes in his sister and sister; Heart disease in his mother; Hypertension in his sister; Kidney disease in his brother and brother; Pancreatic  cancer in his brother. There is no history of Anesthesia problems. Current Outpatient Prescriptions  Medication Sig Dispense Refill  . amLODipine (NORVASC) 10 MG tablet Take 1 tablet (10 mg total) by mouth daily.  30 tablet  11  . aspirin 81 MG EC tablet Take 81 mg by mouth daily.        [name] gabapentin (NEURONTIN) 300 MG capsule Take 2 capsules (600 mg total) by mouth 3 (three) times daily.  180 capsule  11  . ibuprofen (ADVIL,MOTRIN) 800 MG tablet Take 800 mg by mouth every 8 (eight) hours as needed for fever or moderate pain.      [name] lisinopril-hydrochlorothiazide (PRINZIDE,ZESTORETIC) 20-12.5 MG per tablet Take 2 tablets by mouth daily.  180 tablet  3  . omeprazole (PRILOSEC) 40 MG capsule Take 1 capsule (40 mg total) by mouth daily.  90 capsule  3  . prednisoLONE acetate (PRED FORTE) 1 % ophthalmic  suspension       . traMADol (ULTRAM) 50 MG tablet        No current facility-administered medications for this visit.   Allergies as of 06/30/2013  . (No Known Allergies)    reports that he quit smoking about 2 years ago. His smoking use included Cigarettes. He has a 15 pack-year smoking history. He has never used smokeless tobacco. He reports that he does not drink alcohol or use illicit drugs.     Review of Systems: He has chronic back and leg pain for which she takes NSAIDs or it Pertinent positive and negative review of systems were noted in the above HPI section. All other review of systems were otherwise negative.  Vital signs were reviewed in today's medical record Physical Exam: General: Well developed , well nourished, no acute distress Skin: anicteric Head: Normocephalic and atraumatic Eyes:  sclerae anicteric, EOMI Ears: Normal auditory acuity Mouth: No deformity or lesions Neck: Supple, no masses or thyromegaly Lungs: Clear throughout to auscultation Heart: Regular rate and rhythm; no  rubs or bruits.  There is a 2/6 early systolic murmur Abdomen: Soft, non tender and non distended. No masses, hepatosplenomegaly or hernias noted. Normal Bowel sounds Rectal:deferred Musculoskeletal: Symmetrical with no gross deformities  Skin: No lesions on visible extremities Pulses:  Normal pulses noted Extremities: No clubbing, cyanosis,  or deformities noted.  There is 2+ pedal edema Neurological: Alert oriented x 4, grossly nonfocal Cervical Nodes:  No significant cervical adenopathy Inguinal Nodes: No significant inguinal adenopathy Psychological:  Alert and cooperative. Normal mood and affect  See Assessment and Plan under Problem List

## 2013-07-01 ENCOUNTER — Encounter (HOSPITAL_COMMUNITY): Payer: Self-pay

## 2013-07-01 ENCOUNTER — Encounter (HOSPITAL_COMMUNITY)
Admission: RE | Admit: 2013-07-01 | Discharge: 2013-07-01 | Disposition: A | Discharge status: Home / Self Care | Payer: Medicare Other | Source: Ambulatory Visit | Attending: Anesthesiology | Admitting: Anesthesiology

## 2013-07-01 ENCOUNTER — Encounter (HOSPITAL_COMMUNITY)
Admission: RE | Admit: 2013-07-01 | Discharge: 2013-07-01 | Disposition: A | Discharge status: Home / Self Care | Payer: Medicare Other | Source: Ambulatory Visit | Attending: Ophthalmology | Admitting: Ophthalmology

## 2013-07-01 DIAGNOSIS — H3520 Other non-diabetic proliferative retinopathy, unspecified eye: Secondary | ICD-10-CM | POA: Diagnosis not present

## 2013-07-01 DIAGNOSIS — H33009 Unspecified retinal detachment with retinal break, unspecified eye: Secondary | ICD-10-CM | POA: Diagnosis not present

## 2013-07-01 DIAGNOSIS — H31409 Unspecified choroidal detachment, unspecified eye: Secondary | ICD-10-CM | POA: Diagnosis not present

## 2013-07-01 DIAGNOSIS — K219 Gastro-esophageal reflux disease without esophagitis: Secondary | ICD-10-CM | POA: Diagnosis not present

## 2013-07-01 DIAGNOSIS — H3340 Traction detachment of retina, unspecified eye: Secondary | ICD-10-CM | POA: Diagnosis not present

## 2013-07-01 DIAGNOSIS — Z87891 Personal history of nicotine dependence: Secondary | ICD-10-CM | POA: Diagnosis not present

## 2013-07-01 DIAGNOSIS — Z01818 Encounter for other preprocedural examination: Secondary | ICD-10-CM | POA: Diagnosis not present

## 2013-07-01 HISTORY — DX: Polyneuropathy, unspecified: G62.9

## 2013-07-01 LAB — CBC
HCT: 39.4 % (ref 39.0–52.0)
HEMOGLOBIN: 13.6 g/dL (ref 13.0–17.0)
MCH: 32.2 pg (ref 26.0–34.0)
MCHC: 34.5 g/dL (ref 30.0–36.0)
MCV: 93.1 fL (ref 78.0–100.0)
Platelets: 215 10*3/uL (ref 150–400)
RBC: 4.23 MIL/uL (ref 4.22–5.81)
RDW: 15.1 % (ref 11.5–15.5)
WBC: 5.1 10*3/uL (ref 4.0–10.5)

## 2013-07-01 LAB — BASIC METABOLIC PANEL
BUN: 12 mg/dL (ref 6–23)
CHLORIDE: 101 meq/L (ref 96–112)
CO2: 26 meq/L (ref 19–32)
CREATININE: 1.14 mg/dL (ref 0.50–1.35)
Calcium: 9.3 mg/dL (ref 8.4–10.5)
GFR calc Af Amer: 74 mL/min — ABNORMAL LOW (ref >90)
GFR calc non Af Amer: 64 mL/min — ABNORMAL LOW (ref >90)
Glucose, Bld: 92 mg/dL (ref 70–99)
POTASSIUM: 4 meq/L (ref 3.7–5.3)
Sodium: 139 mEq/L (ref 137–147)

## 2013-07-01 NOTE — Progress Notes (Signed)
07/01/13 1230  OBSTRUCTIVE SLEEP APNEA  Have you ever been diagnosed with sleep apnea through a sleep study? No  Do you snore loudly (loud enough to be heard through closed doors)?  0  Do you often feel tired, fatigued, or sleepy during the daytime? 0  Has anyone observed you stop breathing during your sleep? 0  Do you have, or are you being treated for high blood pressure? 1  BMI more than 35 kg/m2? 1  Age over 70 years old? 1  Neck circumference greater than 40 cm/18 inches? 0 (16)  Gender: 1  Obstructive Sleep Apnea Score 4

## 2013-07-01 NOTE — Pre-Procedure Instructions (Signed)
[name]  07/01/2013   Your procedure is scheduled on:  Wednesday, January 7.  Report to [facility], Main Entrance/Entrance "A" at 6:30 AM.  Call this number if you have problems the morning of surgery: [phone]   Remember:   Do not eat food or drink liquids after midnight.   Take these medicines the morning of surgery with A SIP OF WATER: amLODipine (NORVASC), gabapentin (NEURONTIN), omeprazole (PRILOSEC).  Take if needed:traMADol (ULTRAM)      Do not wear jewelry, make-up or nail polish.  Do not wear lotions, powders, or perfumes. You may wear deodorant.   Men may shave face and neck.  Do not bring valuables to the hospital.  [facility] is not responsible for any belongings or valuables.               Contacts, dentures or bridgework may not be worn into surgery.  Leave suitcase in the car. After surgery it may be brought to your room.  For patients admitted to the hospital, discharge time is determined by your treatment team.               Patients discharged the day of surgery will not be allowed to drive home.  Name and phone number of your driver: -   Special Instructions: Shower with CHG wash (Bactoshield) tonight and again in the am prior to arriving to hospital.   Please read over the following fact sheets that you were given: Pain Booklet, Coughing and Deep Breathing and Surgical Site Infection Prevention

## 2013-07-01 NOTE — Progress Notes (Signed)
Anesthesia Chart Review: Patient is a 68 year old male scheduled for vitrectomy, right eye tomorrow morning by Dr. Anderson Malta.  He is a first case.  Chart was given to me to review at 5 PM.  History noted.  He has had three eye procedures since 10/2011 at [facility], last on 03/15/13.    Echo on 12/09/09 showed: - Left ventricle: The cavity size was normal. There was moderate concentric hypertrophy. Systolic function was vigorous. The estimated ejection fraction was in the range of 65% to 70%. Wall motion was normal; there were no regional wall motion abnormalities. Doppler parameters are consistent with abnormal left ventricular relaxation (grade 1 diastolic dysfunction). - Aortic valve: There was mild stenosis. Mild to moderate regurgitation. Valve area: 2.03cm^2(VTI). Valve area: 1.78cm^2 (Vmax).  EKG on 07/01/13 showed NSR, anteroseptal infarct (age undetermined). Overall, I think his EKG is stable when compared to EKGs dating back to 11/02/11.  Preoperative CXR and labs noted.    He tolerated previous eye surgery four months ago.  If not acute changes then I would anticipate that he could proceed as planned.  Further evaluation by his assigned anesthesiologist on the day of surgery.  George Hugh [facility] Short Stay Center/Anesthesiology Phone [phone] 07/01/2013 5:18 PM

## 2013-07-02 ENCOUNTER — Ambulatory Visit (HOSPITAL_COMMUNITY)
Admission: RE | Admit: 2013-07-02 | Discharge: 2013-07-02 | Disposition: A | Discharge status: Home / Self Care | Payer: Medicare Other | Source: Ambulatory Visit | Attending: Ophthalmology | Admitting: Ophthalmology

## 2013-07-02 ENCOUNTER — Encounter (HOSPITAL_COMMUNITY): Payer: Self-pay | Admitting: *Deleted

## 2013-07-02 ENCOUNTER — Encounter (HOSPITAL_COMMUNITY): Payer: Medicare Other | Admitting: Vascular Surgery

## 2013-07-02 ENCOUNTER — Ambulatory Visit (HOSPITAL_COMMUNITY): Payer: Medicare Other | Admitting: Anesthesiology

## 2013-07-02 ENCOUNTER — Encounter (HOSPITAL_COMMUNITY)
Admission: RE | Disposition: A | Discharge status: Home / Self Care | Payer: Self-pay | Source: Ambulatory Visit | Attending: Ophthalmology

## 2013-07-02 DIAGNOSIS — H3520 Other non-diabetic proliferative retinopathy, unspecified eye: Secondary | ICD-10-CM | POA: Diagnosis not present

## 2013-07-02 DIAGNOSIS — H3340 Traction detachment of retina, unspecified eye: Secondary | ICD-10-CM | POA: Insufficient documentation

## 2013-07-02 DIAGNOSIS — H31409 Unspecified choroidal detachment, unspecified eye: Secondary | ICD-10-CM | POA: Insufficient documentation

## 2013-07-02 DIAGNOSIS — H33009 Unspecified retinal detachment with retinal break, unspecified eye: Secondary | ICD-10-CM | POA: Diagnosis not present

## 2013-07-02 DIAGNOSIS — H33059 Total retinal detachment, unspecified eye: Secondary | ICD-10-CM | POA: Diagnosis not present

## 2013-07-02 DIAGNOSIS — H31429 Serous choroidal detachment, unspecified eye: Secondary | ICD-10-CM | POA: Diagnosis not present

## 2013-07-02 DIAGNOSIS — Z87891 Personal history of nicotine dependence: Secondary | ICD-10-CM | POA: Insufficient documentation

## 2013-07-02 DIAGNOSIS — K219 Gastro-esophageal reflux disease without esophagitis: Secondary | ICD-10-CM | POA: Insufficient documentation

## 2013-07-02 HISTORY — PX: PARS PLANA VITRECTOMY: SHX2166

## 2013-07-02 HISTORY — PX: GAS/FLUID EXCHANGE: SHX5334

## 2013-07-02 SURGERY — PARS PLANA VITRECTOMY WITH 23 GAUGE
Anesthesia: General | Site: Eye | Laterality: Right

## 2013-07-02 MED ORDER — LACTATED RINGERS IV SOLN
INTRAVENOUS | Status: DC | PRN
  Administered 2013-07-02 (×2): via INTRAVENOUS

## 2013-07-02 MED ORDER — PHENYLEPHRINE HCL 2.5 % OP SOLN
1.0000 [drp] | OPHTHALMIC | Status: AC | PRN
  Administered 2013-07-02 (×3): 1 [drp] via OPHTHALMIC

## 2013-07-02 MED ORDER — PHENYLEPHRINE HCL 2.5 % OP SOLN
OPHTHALMIC | Status: AC
  Administered 2013-07-02: 07:00:00 1 [drp] via OPHTHALMIC
  Filled 2013-07-02: qty 15

## 2013-07-02 MED ORDER — BUPIVACAINE HCL (PF) 0.75 % IJ SOLN
INTRAMUSCULAR | Status: AC
  Filled 2013-07-02: qty 10

## 2013-07-02 MED ORDER — ONDANSETRON HCL 4 MG/2ML IJ SOLN
INTRAMUSCULAR | Status: DC | PRN
  Administered 2013-07-02: 4 mg via INTRAVENOUS

## 2013-07-02 MED ORDER — OXYCODONE HCL 5 MG PO TABS: 5.0000 mg | ORAL_TABLET | Freq: Once | ORAL | Status: DC | PRN

## 2013-07-02 MED ORDER — GLYCOPYRROLATE 0.2 MG/ML IJ SOLN
INTRAMUSCULAR | Status: DC | PRN
  Administered 2013-07-02: 0.2 mg via INTRAVENOUS
  Administered 2013-07-02: .8 mg via INTRAVENOUS

## 2013-07-02 MED ORDER — SUCCINYLCHOLINE CHLORIDE 20 MG/ML IJ SOLN
INTRAMUSCULAR | Status: DC | PRN
  Administered 2013-07-02: 120 mg via INTRAVENOUS

## 2013-07-02 MED ORDER — ACETAZOLAMIDE SODIUM 500 MG IJ SOLR
INTRAMUSCULAR | Status: AC
  Filled 2013-07-02: qty 500

## 2013-07-02 MED ORDER — BSS PLUS IO SOLN
INTRAOCULAR | Status: AC
  Filled 2013-07-02: qty 500

## 2013-07-02 MED ORDER — CYCLOPENTOLATE HCL 1 % OP SOLN
1.0000 [drp] | OPHTHALMIC | Status: AC | PRN
  Administered 2013-07-02 (×3): 1 [drp] via OPHTHALMIC

## 2013-07-02 MED ORDER — GATIFLOXACIN 0.5 % OP SOLN
OPHTHALMIC | Status: AC
  Administered 2013-07-02: 07:00:00 1 [drp] via OPHTHALMIC
  Filled 2013-07-02: qty 2.5

## 2013-07-02 MED ORDER — LIDOCAINE HCL 2 % IJ SOLN
INTRAMUSCULAR | Status: AC
  Filled 2013-07-02: qty 20

## 2013-07-02 MED ORDER — SODIUM HYALURONATE 10 MG/ML IO SOLN
INTRAOCULAR | Status: AC
  Filled 2013-07-02: qty 0.85

## 2013-07-02 MED ORDER — TETRACAINE HCL 0.5 % OP SOLN
OPHTHALMIC | Status: AC
  Filled 2013-07-02: qty 2

## 2013-07-02 MED ORDER — DEXAMETHASONE SODIUM PHOSPHATE 10 MG/ML IJ SOLN
INTRAMUSCULAR | Status: DC | PRN
  Administered 2013-07-02: 10 mg via INTRAVENOUS

## 2013-07-02 MED ORDER — HYPROMELLOSE (GONIOSCOPIC) 2.5 % OP SOLN
OPHTHALMIC | Status: AC
  Filled 2013-07-02: qty 15

## 2013-07-02 MED ORDER — LIDOCAINE HCL (CARDIAC) 20 MG/ML IV SOLN
INTRAVENOUS | Status: DC | PRN
  Administered 2013-07-02: 100 mg via INTRAVENOUS

## 2013-07-02 MED ORDER — TETRACAINE HCL 0.5 % OP SOLN
OPHTHALMIC | Status: AC
  Administered 2013-07-02: 07:00:00 2 [drp] via OPHTHALMIC
  Filled 2013-07-02: qty 2

## 2013-07-02 MED ORDER — PREDNISOLONE ACETATE 1 % OP SUSP
1.0000 [drp] | OPHTHALMIC | Status: AC
  Administered 2013-07-02: 1 [drp] via OPHTHALMIC

## 2013-07-02 MED ORDER — PREDNISOLONE ACETATE 1 % OP SUSP
OPHTHALMIC | Status: AC
  Administered 2013-07-02: 07:00:00 1 [drp] via OPHTHALMIC
  Filled 2013-07-02: qty 5

## 2013-07-02 MED ORDER — BUPIVACAINE HCL (PF) 0.75 % IJ SOLN
INTRAMUSCULAR | Status: DC | PRN
  Administered 2013-07-02: 09:00:00 via RETROBULBAR

## 2013-07-02 MED ORDER — TETRACAINE HCL 0.5 % OP SOLN
2.0000 [drp] | OPHTHALMIC | Status: AC
  Administered 2013-07-02: 2 [drp] via OPHTHALMIC

## 2013-07-02 MED ORDER — PROPOFOL 10 MG/ML IV BOLUS
INTRAVENOUS | Status: DC | PRN
  Administered 2013-07-02: 200 mg via INTRAVENOUS

## 2013-07-02 MED ORDER — BSS IO SOLN
INTRAOCULAR | Status: AC
  Filled 2013-07-02: qty 15

## 2013-07-02 MED ORDER — EPINEPHRINE HCL 1 MG/ML IJ SOLN
INTRAMUSCULAR | Status: AC
  Filled 2013-07-02: qty 1

## 2013-07-02 MED ORDER — HYDROMORPHONE HCL PF 1 MG/ML IJ SOLN: 0.2500 mg | INTRAMUSCULAR | Status: DC | PRN

## 2013-07-02 MED ORDER — BACITRACIN-POLYMYXIN B 500-10000 UNIT/GM OP OINT
TOPICAL_OINTMENT | OPHTHALMIC | Status: DC | PRN
  Administered 2013-07-02: 1 via OPHTHALMIC

## 2013-07-02 MED ORDER — CEFAZOLIN SUBCONJUNCTIVAL INJECTION 100 MG/0.5 ML
200.0000 mg | INJECTION | SUBCONJUNCTIVAL | Status: DC
  Filled 2013-07-02: qty 1

## 2013-07-02 MED ORDER — ATROPINE SULFATE 1 MG/ML IJ SOLN
INTRAMUSCULAR | Status: DC | PRN
  Administered 2013-07-02 (×2): 0.2 mg via INTRAVENOUS

## 2013-07-02 MED ORDER — TRAMADOL HCL 50 MG PO TABS: 50.0000 mg | ORAL_TABLET | ORAL | Status: DC | PRN

## 2013-07-02 MED ORDER — PROMETHAZINE HCL 25 MG/ML IJ SOLN: 6.2500 mg | INTRAMUSCULAR | Status: DC | PRN

## 2013-07-02 MED ORDER — NEOSTIGMINE METHYLSULFATE 1 MG/ML IJ SOLN
INTRAMUSCULAR | Status: DC | PRN
  Administered 2013-07-02: 5 mg via INTRAVENOUS

## 2013-07-02 MED ORDER — 0.9 % SODIUM CHLORIDE (POUR BTL) OPTIME
TOPICAL | Status: DC | PRN
  Administered 2013-07-02: 1000 mL

## 2013-07-02 MED ORDER — DEXAMETHASONE SODIUM PHOSPHATE 10 MG/ML IJ SOLN
INTRAMUSCULAR | Status: AC
  Filled 2013-07-02: qty 1

## 2013-07-02 MED ORDER — GATIFLOXACIN 0.5 % OP SOLN
1.0000 [drp] | OPHTHALMIC | Status: AC | PRN
  Administered 2013-07-02 (×3): 1 [drp] via OPHTHALMIC

## 2013-07-02 MED ORDER — HYPROMELLOSE (GONIOSCOPIC) 2.5 % OP SOLN
OPHTHALMIC | Status: DC | PRN
  Administered 2013-07-02: 2 [drp] via OPHTHALMIC

## 2013-07-02 MED ORDER — NA CHONDROIT SULF-NA HYALURON 40-30 MG/ML IO SOLN
INTRAOCULAR | Status: DC | PRN
  Administered 2013-07-02: 0.5 mL via INTRAOCULAR

## 2013-07-02 MED ORDER — OXYCODONE HCL 5 MG/5ML PO SOLN: 5.0000 mg | Freq: Once | ORAL | Status: DC | PRN

## 2013-07-02 MED ORDER — BACITRACIN-POLYMYXIN B 500-10000 UNIT/GM OP OINT
TOPICAL_OINTMENT | OPHTHALMIC | Status: AC
  Filled 2013-07-02: qty 3.5

## 2013-07-02 MED ORDER — SODIUM CHLORIDE 0.9 % IV SOLN: INTRAVENOUS | Status: DC

## 2013-07-02 MED ORDER — EPINEPHRINE HCL 1 MG/ML IJ SOLN
INTRAOCULAR | Status: DC | PRN
  Administered 2013-07-02: 09:00:00

## 2013-07-02 MED ORDER — TRIAMCINOLONE ACETONIDE 40 MG/ML IJ SUSP
INTRAMUSCULAR | Status: AC
Start: 2013-07-02 — End: 2013-07-02
  Filled 2013-07-02: qty 5

## 2013-07-02 MED ORDER — CYCLOPENTOLATE HCL 1 % OP SOLN
OPHTHALMIC | Status: AC
  Administered 2013-07-02: 07:00:00 1 [drp] via OPHTHALMIC
  Filled 2013-07-02: qty 2

## 2013-07-02 MED ORDER — BSS IO SOLN
INTRAOCULAR | Status: DC | PRN
  Administered 2013-07-02: 15 mL via INTRAOCULAR

## 2013-07-02 MED ORDER — NA CHONDROIT SULF-NA HYALURON 40-30 MG/ML IO SOLN
INTRAOCULAR | Status: AC
  Filled 2013-07-02: qty 0.5

## 2013-07-02 MED ORDER — CEFAZOLIN SUBCONJUNCTIVAL INJECTION 100 MG/0.5 ML
INJECTION | SUBCONJUNCTIVAL | Status: DC | PRN
  Administered 2013-07-02: 100 mg via SUBCONJUNCTIVAL

## 2013-07-02 MED ORDER — FENTANYL CITRATE 0.05 MG/ML IJ SOLN
INTRAMUSCULAR | Status: DC | PRN
  Administered 2013-07-02: 100 ug via INTRAVENOUS
  Administered 2013-07-02: 50 ug via INTRAVENOUS

## 2013-07-02 MED ORDER — ROCURONIUM BROMIDE 100 MG/10ML IV SOLN
INTRAVENOUS | Status: DC | PRN
  Administered 2013-07-02: 40 mg via INTRAVENOUS

## 2013-07-02 SURGICAL SUPPLY — 88 items
APL SRG 3 HI ABS STRL LF PLS (MISCELLANEOUS) ×1
APPLICATOR COTTON TIP 6IN STRL (MISCELLANEOUS) ×3 IMPLANT
APPLICATOR DR MATTHEWS STRL (MISCELLANEOUS) ×2 IMPLANT
BAG FLD CLT MN 6.25X3.5 (WOUND CARE) ×1
BAG MINI COLL DRAIN (WOUND CARE) ×3 IMPLANT
BLADE EYE MINI 60D BEAVER (BLADE) IMPLANT
BLADE KERATOME 2.75 (BLADE) IMPLANT
BLADE KERATOME 2.75MM (BLADE)
BLADE MVR KNIFE 19G (BLADE) IMPLANT
BLADE STAB KNIFE 15DEG (BLADE) ×2 IMPLANT
CANNULA DUAL BORE 23G (CANNULA) ×2 IMPLANT
CANNULA TROCAR 23 GA VLV (OPHTHALMIC) ×3 IMPLANT
CANNULA TROCAR 23G VLV (OPHTHALMIC) IMPLANT
CANNULA VLV SOFT TIP 23GA (OPHTHALMIC) ×3 IMPLANT
CORDS BIPOLAR (ELECTRODE) ×2 IMPLANT
COVER MAYO STAND STRL (DRAPES) ×3 IMPLANT
DRAPE OPHTHALMIC 77X100 STRL (CUSTOM PROCEDURE TRAY) ×3 IMPLANT
DRAPE POUCH INSTRU U-SHP 10X18 (DRAPES) ×3 IMPLANT
DRSG TEGADERM 4X4.75 (GAUZE/BANDAGES/DRESSINGS) ×3 IMPLANT
ERASER HMR WETFIELD 23G BP (MISCELLANEOUS) IMPLANT
FILTER BLUE MILLIPORE (MISCELLANEOUS) IMPLANT
FORCEPS GRIESHABER ILM 25G A (INSTRUMENTS) IMPLANT
GAS AUTO FILL CONSTEL (OPHTHALMIC)
GAS AUTO FILL CONSTELLATION (OPHTHALMIC) IMPLANT
GLOVE BIO SURGEON STRL SZ7.5 (GLOVE) ×2 IMPLANT
GLOVE SS BIOGEL STRL SZ 6.5 (GLOVE) ×1 IMPLANT
GLOVE SUPERSENSE BIOGEL SZ 6.5 (GLOVE) ×4
GLOVE SURG SS PI 6.5 STRL IVOR (GLOVE) ×4 IMPLANT
GOWN SRG XL XLNG 56XLVL 4 (GOWN DISPOSABLE) ×1 IMPLANT
GOWN STRL NON-REIN LRG LVL3 (GOWN DISPOSABLE) ×5 IMPLANT
GOWN STRL NON-REIN XL XLG LVL4 (GOWN DISPOSABLE) ×3
HANDLE PNEUMATIC FOR CONSTEL (OPHTHALMIC) IMPLANT
ILLUMINATOR WD SAPHIRE 23G (OPHTHALMIC) ×3 IMPLANT
KIT BASIN OR (CUSTOM PROCEDURE TRAY) ×3 IMPLANT
KIT PERFLUORON PROCEDURE 5ML (MISCELLANEOUS) ×2 IMPLANT
KIT ROOM TURNOVER OR (KITS) ×3 IMPLANT
KNIFE CRESCENT 1.75 EDGEAHEAD (BLADE) IMPLANT
KNIFE GRIESHABER SHARP 2.5MM (MISCELLANEOUS) IMPLANT
MASK EYE SHIELD (GAUZE/BANDAGES/DRESSINGS) ×2 IMPLANT
NDL 18GX1X1/2 (RX/OR ONLY) (NEEDLE) ×1 IMPLANT
NDL 25GX 5/8IN NON SAFETY (NEEDLE) ×1 IMPLANT
NDL 27GX1/2 REG BEVEL ECLIP (NEEDLE) ×1 IMPLANT
NDL FILTER BLUNT 18X1 1/2 (NEEDLE) ×1 IMPLANT
NDL HYPO 30X.5 LL (NEEDLE) ×2 IMPLANT
NEEDLE 18GX1X1/2 (RX/OR ONLY) (NEEDLE) ×3 IMPLANT
NEEDLE 22X1 1/2 (OR ONLY) (NEEDLE) IMPLANT
NEEDLE 25GX 5/8IN NON SAFETY (NEEDLE) ×3 IMPLANT
NEEDLE 27GX1/2 REG BEVEL ECLIP (NEEDLE) ×3 IMPLANT
NEEDLE FILTER BLUNT 18X 1/2SAF (NEEDLE) ×2
NEEDLE FILTER BLUNT 18X1 1/2 (NEEDLE) ×1 IMPLANT
NEEDLE HYPO 30X.5 LL (NEEDLE) ×6 IMPLANT
NS IRRIG 1000ML POUR BTL (IV SOLUTION) ×3 IMPLANT
OIL SILICONE OPHTHALMIC ADAPTO (Ophthalmic Related) ×2 IMPLANT
PACK FRAGMATOME (OPHTHALMIC) IMPLANT
PACK VITRECTOMY CUSTOM (CUSTOM PROCEDURE TRAY) ×3 IMPLANT
PAD ARMBOARD 7.5X6 YLW CONV (MISCELLANEOUS) ×6 IMPLANT
PAD EYE OVAL STERILE LF (GAUZE/BANDAGES/DRESSINGS) ×2 IMPLANT
PAK VITRECTOMY PIK  23GA (OPHTHALMIC RELATED) ×3 IMPLANT
PENCIL BIPOLAR 25GA STR DISP (OPHTHALMIC RELATED) ×2 IMPLANT
PROBE LASER ILLUM FLEX CVD 23G (OPHTHALMIC) ×2 IMPLANT
RING MALYGIN (MISCELLANEOUS) ×4 IMPLANT
ROLLS DENTAL (MISCELLANEOUS) ×6 IMPLANT
SCISSORS TIP ADVANCED DSP 25GA (INSTRUMENTS) IMPLANT
SCISSORS TIP GREISHABER 23 VER (OPHTHALMIC) IMPLANT
SCRAPER DIAMOND DUST MEMBRANE (MISCELLANEOUS) IMPLANT
SET INJECTOR OIL FLUID CONSTEL (OPHTHALMIC) ×2 IMPLANT
SPEAR EYE SURG WECK-CEL (MISCELLANEOUS) ×9 IMPLANT
SUT ETHILON 10 0 CS140 6 (SUTURE) IMPLANT
SUT ETHILON 4 0 P 3 18 (SUTURE) IMPLANT
SUT ETHILON 5 0 P 3 18 (SUTURE)
SUT ETHILON 9 0 TG140 8 (SUTURE) IMPLANT
SUT NYLON 10.0 BLK (SUTURE) IMPLANT
SUT NYLON ETHILON 5-0 P-3 1X18 (SUTURE) IMPLANT
SUT PLAIN 6 0 TG1408 (SUTURE) IMPLANT
SUT POLY NON ABSORB 10-0 8 STR (SUTURE) IMPLANT
SUT VICRYL 7 0 TG140 8 (SUTURE) IMPLANT
SUT VICRYL ABS 6-0 S29 18IN (SUTURE) IMPLANT
SYR 20CC LL (SYRINGE) ×3 IMPLANT
SYR 5ML LL (SYRINGE) IMPLANT
SYR TB 1ML LUER SLIP (SYRINGE) ×3 IMPLANT
SYRINGE 10CC LL (SYRINGE) IMPLANT
TAPE SURG TRANSPORE 1 IN (GAUZE/BANDAGES/DRESSINGS) IMPLANT
TAPE SURGICAL TRANSPORE 1 IN (GAUZE/BANDAGES/DRESSINGS) ×2
TOWEL OR 17X24 6PK STRL BLUE (TOWEL DISPOSABLE) ×9 IMPLANT
TUBE EXTENSION HAMMER (TUBING) ×2 IMPLANT
WATER STERILE IRR 1000ML POUR (IV SOLUTION) ×3 IMPLANT
WIPE INSTRUMENT ADHESIVE BACK (MISCELLANEOUS) ×5 IMPLANT
WIPE INSTRUMENT VISIWIPE 73X73 (MISCELLANEOUS) ×3 IMPLANT

## 2013-07-02 NOTE — Anesthesia Procedure Notes (Signed)
Procedure Name: Intubation Date/Time: 07/02/2013 8:33 AM Performed by: Neldon Newport Pre-anesthesia Checklist: Patient identified, Timeout performed, Emergency Drugs available, Suction available and Patient being monitored Patient Re-evaluated:Patient Re-evaluated prior to inductionOxygen Delivery Method: Circle system utilized Preoxygenation: Pre-oxygenation with 100% oxygen Intubation Type: IV induction Ventilation: Mask ventilation without difficulty Laryngoscope Size: Mac and 4 Grade View: Grade II Tube type: Oral Tube size: 7.5 mm Number of attempts: 1 Placement Confirmation: positive ETCO2,  ETT inserted through vocal cords under direct vision and breath sounds checked- equal and bilateral Secured at: 23 cm Tube secured with: Tape Dental Injury: Teeth and Oropharynx as per pre-operative assessment

## 2013-07-02 NOTE — Anesthesia Preprocedure Evaluation (Addendum)
Anesthesia Evaluation  Patient identified by MRN, date of birth, ID band Patient awake    Reviewed: Allergy & Precautions, H&P , NPO status , Patient's Chart, lab work & pertinent test results  Airway Mallampati: III      Dental  (+) Edentulous Upper and Dental Advidsory Given   Pulmonary former smoker,  breath sounds clear to auscultation        Cardiovascular hypertension, Pt. on medications + Valvular Problems/Murmurs AS Rhythm:Regular Rate:Normal     Neuro/Psych  Headaches,    GI/Hepatic GERD-  Medicated and Controlled,  Endo/Other    Renal/GU      Musculoskeletal   Abdominal (+) + obese,   Peds  Hematology   Anesthesia Other Findings   Reproductive/Obstetrics                         Anesthesia Physical Anesthesia Plan  ASA: II  Anesthesia Plan: General   Post-op Pain Management:    Induction: Intravenous  Airway Management Planned: Oral ETT  Additional Equipment:   Intra-op Plan:   Post-operative Plan: Extubation in OR  Informed Consent: I have reviewed the patients History and Physical, chart, labs and discussed the procedure including the risks, benefits and alternatives for the proposed anesthesia with the patient or authorized representative who has indicated his/her understanding and acceptance.   Dental advisory given and Dental Advisory Given  Plan Discussed with: CRNA, Surgeon and Anesthesiologist  Anesthesia Plan Comments:        Anesthesia Quick Evaluation

## 2013-07-02 NOTE — Transfer of Care (Signed)
Immediate Anesthesia Transfer of Care Note  Patient: [name]  Procedure(s) Performed: Procedure(s): PARS PLANA VITRECTOMY WITH 23 GAUGE WITH MEMBRANE PEEL AND ENDOLASER AND SILICONE OIL (Right) GAS/FLUID EXCHANGE (Right)  Patient Location: PACU  Anesthesia Type:General  Level of Consciousness: awake, alert  and oriented  Airway & Oxygen Therapy: Patient Spontanous Breathing and Patient connected to nasal cannula oxygen  Post-op Assessment: Report given to PACU RN, Post -op Vital signs reviewed and stable and Patient moving all extremities X 4  Post vital signs: Reviewed and stable  Complications: No apparent anesthesia complications

## 2013-07-02 NOTE — Progress Notes (Addendum)
Patient has 2 bags of belongings, 1 watch, and cane this was sent to PACU. Patient reports that he has small amount of money with him. Staff did not count/ see same. Patient refused to have watch or money locked up. Patient made aware that [facility] would not be responsible for valuables if they got missing. Patient once again agreed that he did not want same locked up. Valuables taken to PACU by Carmel Sacramento, NT

## 2013-07-02 NOTE — Anesthesia Postprocedure Evaluation (Signed)
  Anesthesia Post-op Note  Patient: [name]  Procedure(s) Performed: Procedure(s): PARS PLANA VITRECTOMY WITH 23 GAUGE WITH MEMBRANE PEEL AND ENDOLASER AND SILICONE OIL (Right) GAS/FLUID EXCHANGE (Right)  Patient Location: PACU  Anesthesia Type:General  Level of Consciousness: awake and alert   Airway and Oxygen Therapy: Patient Spontanous Breathing  Post-op Pain: mild  Post-op Assessment: Post-op Vital signs reviewed  Post-op Vital Signs: stable  Complications: No apparent anesthesia complications

## 2013-07-02 NOTE — Discharge Instructions (Signed)
Quince Santana      07/02/2013  Post-operative instructions for Jaquelynn Wanamaker L. Anderson Malta, MD  Caring for your eye:  Do not rub your eye and wash your hands before touching the eye area. This is important to avoid injury and infection.  You may use sterile gauze pads and sterile eye wash to cleanse the lid margins of mucous accumulation.  DO NOT REUSE GAUZE after wiping the eye. Use a new clean one if needed.  Be certain not to touch the top of the medication bottle to the eyelids to avoid contaminating your medicine bottle and causing infection.  After eye surgery the surface of the eye and eyelids may be puffy. You may note a red blotch(s) on the surface of the eye or a bruise on your eyelid. These are usually related to injections or instruments used in surgery and are not cause for alarm. You may also notice blood tinged tears on your eye pad, this is common and not cause for alarm. These findings will subside over the coming week or two.  Activity:  No jarring activities. Walking with assistance early on as needed is advised. Avoid straining and let me know if you have significant constipation. Do not bend over at the waist with the head below your waist to minimize risk of bleeding inside the eye.  Avoid getting water from washing your hair or showering  in your eye. Patch the eye if necessary during bathing to avoid contamination.    You may: watch television, work on you computer, read books, eat out, ride in a car.  Sleeping Position:    ** You m,ay sleep on the rigght or left side. You may walk normally in an erect position.    **   DO NOT SLEEP ON YOUR BACK!!!              Doing so can cause a very high eye pressure and cause eye injury.    DO NOT travel in an airplane or drive to altitude above 1000 ft above sea level if you have a gas bubble in your eye. This also can cause very high eye pressure.  Wear your eye shield for naps and sleeping at night for the first two weeks.   Eye  Medication:  Zymaxid 1 gtt in operative eye four times daily.                               Prednisolobne Acetate 1% 1 gtt in operative  Eye four times daily  Wait a few minutes between your eye drops when placing them.   Resume your customary medications on your normal schedule.   Adonis Brook MD    After office hours I can be reached by calling:    [phone]

## 2013-07-02 NOTE — Preoperative (Signed)
Beta Blockers   Reason not to administer Beta Blockers:Not Applicable 

## 2013-07-02 NOTE — H&P (View-Only) (Signed)
Patient Record  [name], [name] Patient Number:  [id number] Date of Birth:  [birth year] Age:  68 years old    Gender:  Male Date of Evaluation:  June 25, 2013  Chief Complaint:   68 year old male is referred with a 2 weeks history of decreased vision and an RD OD.  History of Present Illness:   Post PPV, SB, FGX, EL OD by Dr. Hayden Pedro 9/14. He had Phaco IOL OD  by Dr. Anderson Malta x 5/13; os 11/13.    No pain or discomfort. On no meds.   Presents for evaluation.  Uses pred forte occaisionally.( Reviewed by Doctor: GG) Past History:  Allergies:  nkda, Active Medications:   Ocular Medications:  Combigan 1 gtt OD Q12 hrs Lumigan 1 gtt OD QHS Other Medications:  Hydrochlorothiazide 25mg  daily, Gabapentin 300mg  2 caps. TID, Norvasc 10mg  daily, Omeprazole 40mg  daily, Lisinopril 40mg  daily Birth History:  none Past Ocular History:   History of Chronic Iritis OU Combined Cataracts OU Past Medical History:   Hypertension, Arthritis Past Surgical History:   rotator cuff surgery Family History:  no amblyopia, + blindness (brother), + cataracts (mother), no crossed eyes, no diabetic retinopathy, no glaucoma, no macular degeneration, no retinal detachment, no cancer, + diabetes (sister, brother), no heart disease, + high blood pressure (brother, sister), no stroke Social History:   Smoking Status: former smoker  Alcohol:  none   Driving status:  not driving Marital status:  widowed Review of Systems:   Eyes: + blurred vision, + pain, + tearing   Ear/Nose/Throat: + hearing loss  Musculoskeletal: + low back pain, + arthritis,   Neurological: + weakness  All other systems are negative.  Examination:  Visual Acuity:   Distance [facility] [place] [facility]:  OD: 20/400    OS: 20/50 IOP:  OD:  02    @ 03:42PM (Goldmann applanation) Manifest Refraction:    Sphere    Cyl Axis       VA         Add       VA Prism Base R:  -0.75  -1.25  165    20/30       +2.50                      L:  -0.50               20/100        +2.50                        Pupils:  OU:  Shape, size, direct and consensual reaction normal  Adnexa:  Preauricular LN, lacrimal drainage, lacrimal glands, orbit normal  Eyelids:  Eyelids:  normal Conjunctiva:  OU:  non-injected  Cornea:  OU:  arcus, mild SPK, pigmented guttata OU, old  Anterior Chamber:  OU:  3+ deep / clear  Iris:  OD:  some atrophy of anterior stroma, max dilation at 62mm OU:  normal, rubeosis absent Dilation:  OD: Tropicamide 1%/ N 2.5% @ 04:11PM  Lens:  OU:  PC IOL in good position  Vitreous:  OD:  post PPV OS:  post PPV  Optic Disc:  OD:  cupping: 0.35 OS:  cupping: 0.35  Macula:  OD:  detached OS:  normal  Vessels:  OU:  normal  Periphery:  OD:  PVR OS:  normal    Orientation to person, place and time:  Normal  Mood and affect:  Normal  Impression:  361.01  Retinal Detachment, partial, 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 The membranes were caused by his previous retinal tears OD. Cells gained entry into the vitreous cavity when he developed his tears in September. Those cellular elements have proliferated causing traction on the retina which has caused the recurrent detachment.  I have recommended vitrectomy, membrane peeling, FGX, EL OD, possible silicone oil pending his findings. He already has a Scleral Buckle.  He indicated understanding our discussion and felt that his questions had been answered to his satisfaction.  He indicates that he desires to proceed with the recommended treatment/care plan.   Medications:   Hold : Combigan 1 gtt OD Q12 rs WA  Lumigan OD QHS Continmue: Pred Forte  (prednisolone acetate) drops,suspension 1% Apply 1 drop into right eye four times a day, 10 ml, Refills:1, Substitution Permitted:[facility] [phone] Patient Instructions:  Please do not eat anything after mignight the day before surgery. Return to clinic:   07/03/2013, 4:00 PM for post-operative follow-up   Schedule:  Pars Plana, Vitrectomy, Membrane Peeling, Endolaser, Fluid Gas Exchange, possible  Silicone Oil , OD x 07/01/3844.   (electronically signed)  Adonis Brook, MD

## 2013-07-02 NOTE — Op Note (Signed)
[name] 07/02/2013 Pre-op Diagnosis: Proliferative Vitreo-Retinopathy and Combined Tractional, Rhegmatogenous Retina Detachment Post-op Diagnosis: Proliferative Vitreo-Retinopathy and Combined Tractional, Rhegmatogenous Retina Detachment, Modearte Choroidal Detachment   Procedure: Pars Plana Vitrectomy, Endolaser, Fluid Gas Exchange and Silicone Oil Operative Eye:  right eye  Surgeon: Adonis Brook Estimated Blood Loss: minimal Specimens for Pathology:  None Complications: none   The  patient was prepped and draped in the usual fashion for ocular surgery on the  right eye .  A solid lid speculum was placed. The conjunctiva was displaced with a cotton tipped applicator at the  8:75  meridian. A trocar/cannula was placed 3.5 mm from the surgical limbus. The cannula was not  visualized in the vitreous cavity due to patient having developed a choroidal detachment with overlying RD since he was seen in the office. The retina was highly bullous in the temporal and inferior quadrants . Care was taken to place a cannula at the 9:30 meridian, introducing it paralllel to the iris plane.  A second trocar was placed in similar fashion at the 2:30 meridian and successful enrty into the vitreous cavity was obtained. The light pipe was inserted at the 2:30 meridian into the vitreous cavity. Perflouron liquid was then placed through the 9:30 cannula and used to anchor the detached retina posteriorly permitting safe entry into the eye with instruments. A third trocar was then placed at the 12:00 meridian and the cannula was well visualized. The infusion cannula was attached at that location. The infusion line was allowed to run and then clamped when placed at the cannula opening. The line was inserted and secured to the drape with an adhesive strip.  The pupil size was only 57mm so I used a Malyugin ring to enlarge the pupil. A 15 degree blade was used to enter the [facility] and Viscoat was placed. The ring was injected  with it's handpiece and it was secured on the iris margin at 3:00 and 6:00.  A Kuglin hook was used to capture the 9:00 and 12:00 meridian of the iris. An ample aperture was achieved uneventfully.  The light pipe and vitreous cutter were inserted. The cutter was used to remove additional posterior capsule to enlarge the visual axis. The wide field lens was placed. The patient had two open breaks at 12:00 near the margin of old laser treatment and another more temporally in the equatorial region. There was persistent choroidal in the temporal retina, but it was much reduced with egress from the first cannula site.  Any residual anterior vitreuos was shaved carefully at the vitreous base. Total air/fluid exchange was then carried out. And the retina re-attached easily.  As I was anticipating using  Silicone oil I used the focal cautery to make a full thickness opening and drain the reaminder of the choroidal as I desired a complete vitreous fill. The cutter was was used to make the opening and the extrusion tip was used to remove the SR heme.  The retina reattached nicely at that juncture.  Endolaser was applied around the two retinal breaks and as well around the purposeful retinotomy/choroidotomy. A broad laser barrier was used. The superior infusion line was repositioned an additional time as the cannula began to back out such that it was beneath retina at 12:00. The new cannula was placed at 11:30 and the case was completed uneventfully. [id number] Silicone Oil was injected using the 23 g cannula at the 9:30 cannula and was directly visualized with the contact lens entering the posterior chamber.  Once the fill was complete, the infusion  line was clamped. A small syringe on a 26 g needle was used to evacuate a residual air bubble at the front of the vitreous cavity. The ocular pressure was less than 10 to palpation.  The cannulas were removed from the 9:30 and 2:30,  11:30 positions with concommitant tamponade  using a cotton tipped applicator. Subconjunctival injections of Ancef 100mg /0.56ml and Dexamethasone 4mg /25ml were placed in the infero-medial quadrant to avoid proximity to the cannula sites.    The speculum and drapes were removed and the eye was patched with Polymixin/Bacitracin ophthalmic ointment. An eye shield was placed and the patient was transferred alert and conversant with stable vital signs to the post operative recovery area.  Adonis Brook MD

## 2013-07-02 NOTE — Interval H&P Note (Signed)
History and Physical Interval Note:  07/02/2013 7:11 AM BP 116/65  Pulse 64  Temp(Src) 98.1 F (36.7 C) (Oral)  Resp 20  SpO2 100%   [name]  has presented today for surgery, with the diagnosis of Retinal Detachment  The various methods of treatment have been discussed with the patient and family. After consideration of risks, benefits and other options for treatment, the patient has consented to  Procedure(s): PARS PLANA VITRECTOMY WITH 23 GAUGE (Right) as a surgical intervention .  The patient's history has been reviewed, patient examined, no change in status, stable for surgery.  I have reviewed the patient's chart and labs.  Questions were answered to the patient's satisfaction.     Loreley Schwall, Lavella Hammock

## 2013-07-04 ENCOUNTER — Encounter: Payer: Self-pay | Admitting: Internal Medicine

## 2013-07-07 ENCOUNTER — Encounter (HOSPITAL_COMMUNITY): Payer: Self-pay | Admitting: Ophthalmology

## 2013-07-09 ENCOUNTER — Other Ambulatory Visit: Payer: Self-pay | Admitting: Internal Medicine

## 2013-07-09 DIAGNOSIS — I10 Essential (primary) hypertension: Secondary | ICD-10-CM

## 2013-07-09 DIAGNOSIS — M48062 Spinal stenosis, lumbar region with neurogenic claudication: Secondary | ICD-10-CM

## 2013-07-16 ENCOUNTER — Telehealth: Payer: Self-pay | Admitting: Gastroenterology

## 2013-07-16 ENCOUNTER — Encounter: Payer: Self-pay | Admitting: *Deleted

## 2013-07-16 NOTE — Telephone Encounter (Signed)
Spoke with patient's nursing home and they will fax over the form to fill out for transportation.

## 2013-07-16 NOTE — Telephone Encounter (Signed)
Received fax from [facility] with information needed in letter. Faxed letter to [phone] as [facility] requested.

## 2013-08-06 ENCOUNTER — Encounter: Payer: Self-pay | Admitting: Internal Medicine

## 2013-08-06 ENCOUNTER — Ambulatory Visit (INDEPENDENT_AMBULATORY_CARE_PROVIDER_SITE_OTHER): Payer: Medicare Other | Admitting: Internal Medicine

## 2013-08-06 VITALS — BP 127/68 | HR 68 | Temp 98.3°F | Ht 75.0 in | Wt 275.5 lb

## 2013-08-06 DIAGNOSIS — K220 Achalasia of cardia: Secondary | ICD-10-CM

## 2013-08-06 DIAGNOSIS — J069 Acute upper respiratory infection, unspecified: Secondary | ICD-10-CM

## 2013-08-06 DIAGNOSIS — M19019 Primary osteoarthritis, unspecified shoulder: Secondary | ICD-10-CM

## 2013-08-06 DIAGNOSIS — I10 Essential (primary) hypertension: Secondary | ICD-10-CM | POA: Diagnosis not present

## 2013-08-06 DIAGNOSIS — K219 Gastro-esophageal reflux disease without esophagitis: Secondary | ICD-10-CM

## 2013-08-06 DIAGNOSIS — B9789 Other viral agents as the cause of diseases classified elsewhere: Principal | ICD-10-CM

## 2013-08-06 DIAGNOSIS — M1990 Unspecified osteoarthritis, unspecified site: Secondary | ICD-10-CM

## 2013-08-06 MED ORDER — TRAMADOL HCL 50 MG PO TABS: 50.0000 mg | ORAL_TABLET | Freq: Three times a day (TID) | ORAL | Status: DC | PRN

## 2013-08-06 MED ORDER — DEXTROMETHORPHAN POLISTIREX 30 MG/5ML PO LQCR: 30.0000 mg | Freq: Two times a day (BID) | ORAL | Status: DC

## 2013-08-06 NOTE — Assessment & Plan Note (Signed)
Patient reports good compliance with omeprazole 40 mg daily and does not complaining of increased burning sensation.  Continue current regimen, reassess after EGD on 2/24 (for achalasia).

## 2013-08-06 NOTE — Assessment & Plan Note (Signed)
Stable.  EGD with Dr. Deatra Ina on 2/24.

## 2013-08-06 NOTE — Assessment & Plan Note (Signed)
Patient presents with cough, sneezing, intermittent subjective fevers x ~2 weeks most consistent with URI.  Of note, patient is taking an ACEi (which can cause cough) but given acuity of his cough along with other symptoms indicative of viral URI, will treat his cough symptomatically for now.  Patient also has presumed achalasia as well as GERD which could cause cough, but he reports good medication compliance and no worsening of burning sensation or difficulty swallowing (and has EGD on 2/24) thus will hold off on increasing PPI dose for now.  Prescribed Delsym cough syrup and instructed patient to take Tylenol as needed as well.   Explained to him that this cough may persist for additional month.

## 2013-08-06 NOTE — Patient Instructions (Signed)
Follow-up with Dr. Eppie Gibson on 5/1 (as scheduled).   Keep up the good work taking all of your medicines as prescribed!  We are giving you prescriptions for a refill on your pain medicine as well as a cough syrup.    Don't forget your appointment for colonoscopy/endoscopy on 2/24!  Achalasia Achalasia is a condition in which a person cannot get food through the lower esophagus. This is the tube that carries food from your mouth to your stomach. This happens because there are no nerves to the lower esophagus and the esophageal sphincter. This is the circular muscle between the stomach and esophagus that relaxes to allow food into the stomach. It then contracts to keep food in the stomach. This absence of nerves may be present since birth. This condition causes difficulty swallowing, chest pain, and food coming back up in the mouth after being swallowed. DIAGNOSIS  This condition is diagnosed by x-ray, pressure studies in the esophagus, and endoscopy. This is when your caregiver looks into your esophagus with a small flexible telescope. The portion of the esophagus above the narrowing is usually enlarged when the condition is present. TREATMENT   Soft diets are helpful.  Medications will help the food pass more easily into the stomach.  If conservative treatment (as above) does not work, stretching the end of the esophagus with a balloon may help.  Sometimes surgical treatment is used and a segment of esophagus is removed. SEEK IMMEDIATE MEDICAL CARE IF:  You are unable to keep fluids down or it feels as though food sticks in your chest area.  Vomiting becomes persistent.  Chest or belly pain develops, increases, or localizes.  You have a fever. If problems are continuing and not allowing you to live a normal lifestyle, talk with your caregiver and discuss medical means to help this. Document Released: 03/22/2005 Document Revised: 09/04/2011 Document Reviewed: 07/05/2006 [facility] Patient  Information 2014 [place].

## 2013-08-06 NOTE — Progress Notes (Signed)
Patient ID: Lucy Woolever, male   DOB: 1946-01-12, 68 y.o.   MRN: [id number]   Subjective:   Patient ID: Naol Ontiveros male   DOB: 1945/08/06 68 y.o.   MRN: [id number]  HPI: Mr.Kallin Sia is a 68 y.o. man with history of HTN, GERD, achalasia (on barium swallow 04/27/13), lumbar stenosis, OA who presents for acute visit with cough.   Patient states that he started coughing about 2 weeks ago.  Cough is non-productive; he has also been sneezing and had watery eyes.  He had a subjective fever intermittently until two days ago.  He has been taking Nyquil with some relief though his cough is still quite bothersome.  No changes in medications.    Patient is also complaining of chronic pain in his left shoulder and back for which he is requesting refill on Tramadol.  He ambulates with a walker.    Patient saw GI Dr. Deatra Ina on 06/30/13 who recommended upper endoscopy for further evaluation of achalasia.  Given patient's significant family history of colon cancer, he will perform repeat colonoscopy simultaneously (last colonoscopy 2009, showed hyperplastic polyps).    Past Medical History  Diagnosis Date  . Essential hypertension 06/15/2006  . 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 Mild aortic valve stenosis 10/09/2006    Echo (12/09/2009): Valve area: 2.03 cm2   . Lumbar stenosis with neurogenic claudication 06/16/2006    Moderate: L2 through L4.  Complicated by chronic low back pain and neuropathy.  Nerve conduction study (10/19/11): Diffusely low motor amplitudes, with primarily involvement of the peroneal and posterior tibial nerves. No evidence of generalized peripheral neuropathy. Findings could be c/w bil multilevel lumbosacral radiculopathies or a primary motor neuropathy.   . Osteoarthritis 11/25/2009    Right knee (medial compartment), right hip, left shoulder   . Obesity, Class II, BMI 35-39.9, with comorbidity 04/05/2012    . Gastroesophageal reflux disease 02/04/2007  . B12 deficiency 10/01/2011    Discovered to 2 progressive neurologic pain and dysfunction.  Diagnosis established March 2013.  Requires the following treatment: B12 IM monthly until level normalizes. After that, oral supplementation.   . 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 Cataract   . Neuropathy    Current Outpatient Prescriptions  Medication Sig Dispense Refill  . amitriptyline (ELAVIL) 25 MG tablet Take 1 tablet (25 mg total) by mouth at bedtime.  90 tablet  3  . amLODipine (NORVASC) 10 MG tablet Take 1 tablet (10 mg total) by mouth daily.  90 tablet  3  . aspirin 81 MG EC tablet Take 81 mg by mouth daily.        [name] dextromethorphan (DELSYM) 30 MG/5ML liquid Take 5 mLs (30 mg total) by mouth 2 (two) times daily.  89 mL  0  . gabapentin (NEURONTIN) 300 MG capsule Take 2 capsules (600 mg total) by mouth 3 (three) times daily.  540 capsule  3  . ibuprofen (ADVIL,MOTRIN) 800 MG tablet Take 800 mg by mouth every 8 (eight) hours as needed for fever or moderate pain.      [name] lisinopril-hydrochlorothiazide (PRINZIDE,ZESTORETIC) 20-12.5 MG per tablet Take 2 tablets by mouth daily.  180 tablet  3  . omeprazole (PRILOSEC) 40 MG capsule Take 1 capsule (40 mg total) by mouth daily.  90 capsule  3  . prednisoLONE acetate (PRED FORTE) 1 % ophthalmic suspension       .  traMADol (ULTRAM) 50 MG tablet Take 1 tablet (50 mg total) by mouth every 8 (eight) hours as needed.  90 tablet  2   No current facility-administered medications for this visit.   Family History  Problem Relation Age of Onset  . Heart disease Mother   . Colon cancer Father 60  . Kidney disease Brother     on dialysis  . Pancreatic cancer Brother   . Hypertension Sister   . Diabetes Sister   . Anesthesia problems Neg Hx   . Kidney disease Brother     on dialysis  . Diabetes Sister    History   Social History  . Marital  Status: Single    Spouse Name: N/A    Number of Children: N/A  . Years of Education: N/A   Social History Main Topics  . Smoking status: Former Smoker -- 0.50 packs/day for 30 years    Types: Cigarettes    Quit date: 11/05/2010  . Smokeless tobacco: Never Used  . Alcohol Use: No     Comment: Heavy in the past  . Drug Use: No  . Sexual Activity: No     Comment: quit in 2014   Other Topics Concern  . None   Social History Narrative  . None   Review of Systems: ROS   Objective:  Physical Exam: Filed Vitals:   08/06/13 1128  BP: 127/68  Pulse: 68  Temp: 98.3 F (36.8 C)  TempSrc: Oral  Height: 6\' 3"  (1.905 m)  Weight: 275 lb 8 oz (124.966 kg)  SpO2: 100%    General: alert, cooperative, and in no apparent distress HEENT: NCAT, vision grossly intact, oropharynx clear and non-erythematous  Neck: supple, no lymphadenopathy Lungs: clear to ascultation bilaterally, normal work of respiration, no wheezes, rales, ronchi Heart: 2/6 systolic murmur best heart at LUSB c/w AS, regular rate and rhythm, no murmurs, gallops, or rubs Abdomen: soft, non-tender, non-distended, normal bowel sounds Extremities: 2+ DP/PT pulses bilaterally, no cyanosis, clubbing, or edema Neurologic: alert & oriented X3, cranial nerves II-XII intact, strength grossly intact, sensation intact to light touch; gait relatively stable today with assistance of cane  Assessment & Plan:  Patient discussed with Dr. Lynnae January.  Please see problem-based assessment and plan.

## 2013-08-06 NOTE — Assessment & Plan Note (Signed)
Patient complaining primarily of left shoulder pain today.  He has been out of his Tramadol for some time.  Refilled Tramadol today (50 mg q8h prn #90 R2).  However, per PCP's last note, dose was to be increased to 100 mg q8h prn.  Reassess dose at next visit as I did not realize this until after patient had left (appears that most recent rx was for 50 mg q8h so this is what was reordered).

## 2013-08-06 NOTE — Assessment & Plan Note (Addendum)
BP Readings from Last 3 Encounters:  08/06/13 127/68  07/02/13 129/66  07/02/13 129/66    Lab Results  Component Value Date   NA 139 07/01/2013   K 4.0 07/01/2013   CREATININE 1.14 07/01/2013    Assessment: Blood pressure control:  controlled Progress toward BP goal:   at goal  Plan: Medications:  continue current medications- amlodipine 10 mg daily, lisinopril-HCTZ 40-25 mg daily Other plans: PCP follow-up already scheduled in <3 months.

## 2013-08-07 NOTE — Progress Notes (Signed)
Case discussed with Dr. Rogers at the time of the visit.  We reviewed the resident's history and exam and pertinent patient test results.  I agree with the assessment, diagnosis, and plan of care documented in the resident's note. 

## 2013-08-19 ENCOUNTER — Encounter: Payer: Self-pay | Admitting: Gastroenterology

## 2013-08-19 ENCOUNTER — Ambulatory Visit (AMBULATORY_SURGERY_CENTER): Payer: Medicare Other | Admitting: Gastroenterology

## 2013-08-19 VITALS — BP 162/96 | HR 66 | Temp 97.2°F | Resp 17 | Ht 75.0 in | Wt 275.0 lb

## 2013-08-19 DIAGNOSIS — K219 Gastro-esophageal reflux disease without esophagitis: Secondary | ICD-10-CM

## 2013-08-19 DIAGNOSIS — R1310 Dysphagia, unspecified: Secondary | ICD-10-CM | POA: Diagnosis not present

## 2013-08-19 DIAGNOSIS — Z86010 Personal history of colonic polyps: Secondary | ICD-10-CM | POA: Diagnosis not present

## 2013-08-19 DIAGNOSIS — K220 Achalasia of cardia: Secondary | ICD-10-CM | POA: Diagnosis not present

## 2013-08-19 DIAGNOSIS — K648 Other hemorrhoids: Secondary | ICD-10-CM

## 2013-08-19 DIAGNOSIS — Z1211 Encounter for screening for malignant neoplasm of colon: Secondary | ICD-10-CM | POA: Diagnosis not present

## 2013-08-19 DIAGNOSIS — K5730 Diverticulosis of large intestine without perforation or abscess without bleeding: Secondary | ICD-10-CM

## 2013-08-19 DIAGNOSIS — I10 Essential (primary) hypertension: Secondary | ICD-10-CM | POA: Diagnosis not present

## 2013-08-19 DIAGNOSIS — Z800 Family history of malignant neoplasm of digestive organs: Secondary | ICD-10-CM | POA: Diagnosis not present

## 2013-08-19 MED ORDER — SODIUM CHLORIDE 0.9 % IV SOLN: 500.0000 mL | INTRAVENOUS | Status: DC

## 2013-08-19 NOTE — Patient Instructions (Signed)
YOU HAD AN ENDOSCOPIC PROCEDURE TODAY AT [facility]: Refer to the procedure report that was given to you for any specific questions about what was found during the examination.  If the procedure report does not answer your questions, please call your gastroenterologist to clarify.  If you requested that your care partner not be given the details of your procedure findings, then the procedure report has been included in a sealed envelope for you to review at your convenience later.  YOU SHOULD EXPECT: Some feelings of bloating in the abdomen. Passage of more gas than usual.  Walking can help get rid of the air that was put into your GI tract during the procedure and reduce the bloating. If you had a lower endoscopy (such as a colonoscopy or flexible sigmoidoscopy) you may notice spotting of blood in your stool or on the toilet paper. If you underwent a bowel prep for your procedure, then you may not have a normal bowel movement for a few days. INCREASE THE FIBER IN YOUR DIET TO PREVENT DIVERTICULITIS.  DIET: Your first meal following the procedure should be a light meal and then it is ok to progress to your normal diet.  A half-sandwich or bowl of soup is an example of a good first meal.  Heavy or fried foods are harder to digest and may make you feel nauseous or bloated.  Likewise meals heavy in dairy and vegetables can cause extra gas to form and this can also increase the bloating.  Drink plenty of fluids but you should avoid alcoholic beverages for 24 hours.  ACTIVITY: Your care partner should take you home directly after the procedure.  You should plan to take it easy, moving slowly for the rest of the day.  You can resume normal activity the day after the procedure however you should NOT DRIVE or use heavy machinery for 24 hours (because of the sedation medicines used during the test).    SYMPTOMS TO REPORT IMMEDIATELY: A gastroenterologist can be reached at any hour.  During normal  business hours, 8:30 AM to 5:00 PM Monday through Friday, call [phone].  After hours and on weekends, please call the GI answering service at [phone] who will take a message and have the physician on call contact you.   Following lower endoscopy (colonoscopy or flexible sigmoidoscopy):  Excessive amounts of blood in the stool  Significant tenderness or worsening of abdominal pains  Swelling of the abdomen that is new, acute  Fever of 100F or higher  Following upper endoscopy (EGD)  Vomiting of blood or coffee ground material  New chest pain or pain under the shoulder blades  Painful or persistently difficult swallowing  New shortness of breath  Fever of 100F or higher  Black, tarry-looking stools  FOLLOW UP: If any biopsies were taken you will be contacted by phone or by letter within the next 1-3 weeks.  Call your gastroenterologist if you have not heard about the biopsies in 3 weeks.  Our staff will call the home number listed on your records the next business day following your procedure to check on you and address any questions or concerns that you may have at that time regarding the information given to you following your procedure. This is a courtesy call and so if there is no answer at the home number and we have not heard from you through the emergency physician on call, we will assume that you have returned to your regular daily  activities without incident.  SIGNATURES/CONFIDENTIALITY: You and/or your care partner have signed paperwork which will be entered into your electronic medical record.  These signatures attest to the fact that that the information above on your After Visit Summary has been reviewed and is understood.  Full responsibility of the confidentiality of this discharge information lies with you and/or your care-partner.  THE STAFF FROM THE 3RD FLOOR WILL CALL YOU WITH AN APPOINTMENT FOR A MANOMETRY.   THIS TEST CHECKS THE MUSCLES IN YOUR ESOPHAGUS TO  SEE IF THEY ARE WORKING CORRECTLY.

## 2013-08-19 NOTE — Op Note (Signed)
[place]  [employer] [place], [id number]   ENDOSCOPY PROCEDURE REPORT  PATIENT: Shawn, [name]  MR#: [id number] BIRTHDATE: Nov 24, 1945 , 42  yrs. old GENDER: Male ENDOSCOPIST: Inda Castle, MD REFERRED BY:  [name], M.D. PROCEDURE DATE:  08/19/2013 PROCEDURE:  EGD, diagnostic ASA CLASS:     Class II INDICATIONS:  achalasia suspected. MEDICATIONS: There was residual sedation effect present from prior procedure, propofol (Diprivan) 50mg  IV, and Simethicone 0.6cc PO TOPICAL ANESTHETIC: Cetacaine Spray  DESCRIPTION OF PROCEDURE: After the risks benefits and alternatives of the procedure were thoroughly explained, informed consent was obtained.  The LB [id number] endoscope was introduced through the mouth and advanced to the third portion of the duodenum. Without limitations.  The instrument was slowly withdrawn as the mucosa was fully examined.      The esophagus was patulous.  At the GE junction there was moderate stenosis.  The scope passed through with mild resistance.   The esophagus was patulous.  At the GE junction there was moderate stenosis.  The scope passed through with mild resistance.   The remainder of the upper endoscopy exam was otherwise normal. Retroflexed views revealed no abnormalities.     The scope was then withdrawn from the patient and the procedure completed.  COMPLICATIONS: There were no complications. ENDOSCOPIC IMPRESSION: 1.   endoscopic findings consistent with achalasia  RECOMMENDATIONS: esophageal manometry REPEAT EXAM:  eSigned:  Inda Castle, MD 08/19/2013 11:43 AM   CC:

## 2013-08-19 NOTE — Progress Notes (Signed)
No egg or soy allergy. ewm No problems with past sedation. emw 

## 2013-08-19 NOTE — Op Note (Signed)
[place]  [address]. [place], [address]   COLONOSCOPY PROCEDURE REPORT  PATIENT: [name], [name]  MR#: [id number] BIRTHDATE: 1946/03/19 , 68  yrs. old GENDER: Male ENDOSCOPIST: Inda Castle, MD REFERRED WN:[id number] Eppie Gibson, M.D. PROCEDURE DATE:  08/19/2013 PROCEDURE:   Colonoscopy, diagnostic First Screening Colonoscopy - Avg.  risk and is 50 yrs.  old or older - No.  Prior Negative Screening - Now for repeat screening. Above average risk  History of Adenoma - Now for follow-up colonoscopy & has been > or = to 3 yrs.  N/A  Polyps Removed Today? No.  Recommend repeat exam, <10 yrs? Yes.  High risk (family or personal hx). ASA CLASS:   Class II INDICATIONS:Patient's immediate family history of colon cancer. MEDICATIONS: MAC sedation, administered by CRNA and propofol (Diprivan) 300mg  IV  DESCRIPTION OF PROCEDURE:   After the risks benefits and alternatives of the procedure were thoroughly explained, informed consent was obtained.  A digital rectal exam revealed no abnormalities of the rectum.   The LB [id number]  endoscope was introduced through the anus and advanced to the cecum, which was identified by both the appendix and ileocecal valve. No adverse events experienced.   The quality of the prep was Suprep fair  The instrument was then slowly withdrawn as the colon was fully examined.      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  The largest measured 2 mm.  There was no fresh or old blood. Diverticulosis was noted at the cecum.  No bleeding was noted from the diverticulosis.   Moderate diverticulosis was noted at the cecum and in the ascending colon.   Internal hemorrhoids were found.   The colon was otherwise normal.  There was no diverticulosis, inflammation, polyps or cancers unless previously stated.  Retroflexed views revealed internal hemorrhoids. The time to cecum=8 minutes 42 seconds.  Withdrawal time=13 minutes  59 seconds.  The scope was withdrawn and the procedure completed. COMPLICATIONS: There were no complications.  ENDOSCOPIC IMPRESSION:1 1.  cecal AVMs 2.   Diverticulosis was noted at the cecum 3.   Moderate diverticulosis was noted at the cecum and in the ascending colon 4.   Internal hemorrhoids 5.   The colon was otherwise normal  RECOMMENDATIONS: Given your significant family history of colon cancer, you should have a repeat colonoscopy in 5 years   eSigned:  Inda Castle, MD 08/19/2013 11:38 AM   cc:   PATIENT NAME:  [name], [name] MR#: [id number]

## 2013-08-20 ENCOUNTER — Telehealth: Payer: Self-pay | Admitting: *Deleted

## 2013-08-20 ENCOUNTER — Other Ambulatory Visit: Payer: Self-pay

## 2013-08-20 DIAGNOSIS — R1310 Dysphagia, unspecified: Secondary | ICD-10-CM

## 2013-08-20 NOTE — Telephone Encounter (Signed)
  Follow up Call-  Call back number 08/19/2013  Post procedure Call Back phone  # [phone]  Permission to leave phone message Yes     Patient questions:  Do you have a fever, pain , or abdominal swelling? no Pain Score  0 *  Have you tolerated food without any problems? yes  Have you been able to return to your normal activities? yes  Do you have any questions about your discharge instructions: Diet   no Medications  no Follow up visit  no  Do you have questions or concerns about your Care? no  Actions: * If pain score is 4 or above: No action needed, pain <4.

## 2013-08-25 ENCOUNTER — Ambulatory Visit (HOSPITAL_COMMUNITY)
Admission: RE | Admit: 2013-08-25 | Discharge: 2013-08-25 | Disposition: A | Discharge status: Home / Self Care | Payer: Medicare Other | Source: Ambulatory Visit | Attending: Gastroenterology | Admitting: Gastroenterology

## 2013-08-25 ENCOUNTER — Encounter (HOSPITAL_COMMUNITY)
Admission: RE | Disposition: A | Discharge status: Home / Self Care | Payer: Self-pay | Source: Ambulatory Visit | Attending: Gastroenterology

## 2013-08-25 ENCOUNTER — Other Ambulatory Visit: Payer: Self-pay

## 2013-08-25 DIAGNOSIS — K219 Gastro-esophageal reflux disease without esophagitis: Secondary | ICD-10-CM

## 2013-08-25 DIAGNOSIS — R1310 Dysphagia, unspecified: Secondary | ICD-10-CM | POA: Diagnosis not present

## 2013-08-25 HISTORY — PX: ESOPHAGEAL MANOMETRY: SHX5429

## 2013-08-25 SURGERY — MANOMETRY, ESOPHAGUS

## 2013-08-25 MED ORDER — LIDOCAINE VISCOUS 2 % MT SOLN
OROMUCOSAL | Status: AC
  Filled 2013-08-25: qty 15

## 2013-08-25 SURGICAL SUPPLY — 1 items: FACESHIELD LNG OPTICON STERILE (SAFETY) IMPLANT

## 2013-08-26 ENCOUNTER — Encounter: Payer: Self-pay | Admitting: Internal Medicine

## 2013-08-26 ENCOUNTER — Encounter (HOSPITAL_COMMUNITY): Payer: Self-pay | Admitting: Gastroenterology

## 2013-08-26 DIAGNOSIS — K648 Other hemorrhoids: Secondary | ICD-10-CM

## 2013-08-26 DIAGNOSIS — K5520 Angiodysplasia of colon without hemorrhage: Secondary | ICD-10-CM

## 2013-08-26 DIAGNOSIS — K5790 Diverticulosis of intestine, part unspecified, without perforation or abscess without bleeding: Secondary | ICD-10-CM

## 2013-08-26 HISTORY — DX: Diverticulosis of intestine, part unspecified, without perforation or abscess without bleeding: K57.90

## 2013-08-26 HISTORY — DX: Angiodysplasia of colon without hemorrhage: K55.20

## 2013-08-26 HISTORY — DX: Other hemorrhoids: K64.8

## 2013-08-29 ENCOUNTER — Encounter: Payer: Self-pay | Admitting: Gastroenterology

## 2013-08-29 NOTE — Progress Notes (Signed)
Patient ID: [name], male   DOB: Feb 05, 1946, 68 y.o.   MRN: [id number] Manometry shows elevated LES pressures with incomplete relaxation.  Contractions are peristaltic.  This does not meet the criteria for achalasia, however, he may benefit from Botox injection of the LES.  I discussed this with the patient and explained that botox could provide him temporary relief of his dysphagia.  He has agreed to proceed.

## 2013-09-01 ENCOUNTER — Other Ambulatory Visit: Payer: Self-pay | Admitting: Internal Medicine

## 2013-09-01 DIAGNOSIS — M1990 Unspecified osteoarthritis, unspecified site: Secondary | ICD-10-CM

## 2013-09-02 ENCOUNTER — Other Ambulatory Visit: Payer: Self-pay

## 2013-09-02 ENCOUNTER — Telehealth: Payer: Self-pay

## 2013-09-02 DIAGNOSIS — R1319 Other dysphagia: Secondary | ICD-10-CM

## 2013-09-02 NOTE — Telephone Encounter (Signed)
Message copied by Algernon Huxley on Tue Sep 02, 2013 11:13 AM ------      Message from: Erskine Emery D      Created: Fri Aug 29, 2013 12:25 PM       Please schedule this patient for EGD with Botox injection. ------

## 2013-09-02 NOTE — Telephone Encounter (Signed)
Pt scheduled for EGD with botox injection at [facility] 09/24/13@12 :30pm. Pt to arrive there at 11:30am.Pt mailed prep instructions. Left message for pt to call back.  Pt aware of appt date and time.

## 2013-09-03 ENCOUNTER — Encounter (HOSPITAL_COMMUNITY): Payer: Self-pay | Admitting: Pharmacy Technician

## 2013-09-09 ENCOUNTER — Encounter (HOSPITAL_COMMUNITY): Payer: Self-pay | Admitting: *Deleted

## 2013-09-09 NOTE — Progress Notes (Signed)
09-09-13 Spoke with Jenny Reichmann and pt. -informed of AM meds to take, time and where to come, have responsible driver. May need to review history again-(hard to understand verbal responses over phone)with Rena Sweeden,RN

## 2013-09-24 ENCOUNTER — Ambulatory Visit (HOSPITAL_COMMUNITY)
Admission: RE | Admit: 2013-09-24 | Discharge: 2013-09-24 | Disposition: A | Discharge status: Home / Self Care | Payer: Medicare Other | Source: Ambulatory Visit | Attending: Gastroenterology | Admitting: Gastroenterology

## 2013-09-24 ENCOUNTER — Encounter (HOSPITAL_COMMUNITY)
Admission: RE | Disposition: A | Discharge status: Home / Self Care | Payer: Self-pay | Source: Ambulatory Visit | Attending: Gastroenterology

## 2013-09-24 ENCOUNTER — Ambulatory Visit (HOSPITAL_COMMUNITY): Payer: Medicare Other | Admitting: Anesthesiology

## 2013-09-24 ENCOUNTER — Encounter (HOSPITAL_COMMUNITY): Payer: Medicare Other | Admitting: Anesthesiology

## 2013-09-24 ENCOUNTER — Encounter (HOSPITAL_COMMUNITY): Payer: Self-pay | Admitting: *Deleted

## 2013-09-24 DIAGNOSIS — Z7982 Long term (current) use of aspirin: Secondary | ICD-10-CM | POA: Diagnosis not present

## 2013-09-24 DIAGNOSIS — G8929 Other chronic pain: Secondary | ICD-10-CM | POA: Insufficient documentation

## 2013-09-24 DIAGNOSIS — Z791 Long term (current) use of non-steroidal anti-inflammatories (NSAID): Secondary | ICD-10-CM | POA: Diagnosis not present

## 2013-09-24 DIAGNOSIS — K220 Achalasia of cardia: Secondary | ICD-10-CM | POA: Insufficient documentation

## 2013-09-24 DIAGNOSIS — Z79899 Other long term (current) drug therapy: Secondary | ICD-10-CM | POA: Insufficient documentation

## 2013-09-24 DIAGNOSIS — E669 Obesity, unspecified: Secondary | ICD-10-CM | POA: Diagnosis not present

## 2013-09-24 DIAGNOSIS — I10 Essential (primary) hypertension: Secondary | ICD-10-CM | POA: Diagnosis not present

## 2013-09-24 DIAGNOSIS — M79609 Pain in unspecified limb: Secondary | ICD-10-CM | POA: Insufficient documentation

## 2013-09-24 DIAGNOSIS — K869 Disease of pancreas, unspecified: Secondary | ICD-10-CM | POA: Insufficient documentation

## 2013-09-24 DIAGNOSIS — I359 Nonrheumatic aortic valve disorder, unspecified: Secondary | ICD-10-CM | POA: Insufficient documentation

## 2013-09-24 DIAGNOSIS — R1319 Other dysphagia: Secondary | ICD-10-CM | POA: Diagnosis not present

## 2013-09-24 DIAGNOSIS — M545 Low back pain, unspecified: Secondary | ICD-10-CM | POA: Diagnosis not present

## 2013-09-24 DIAGNOSIS — Z800 Family history of malignant neoplasm of digestive organs: Secondary | ICD-10-CM | POA: Diagnosis not present

## 2013-09-24 DIAGNOSIS — K222 Esophageal obstruction: Secondary | ICD-10-CM | POA: Diagnosis not present

## 2013-09-24 DIAGNOSIS — Z87891 Personal history of nicotine dependence: Secondary | ICD-10-CM | POA: Insufficient documentation

## 2013-09-24 DIAGNOSIS — R1310 Dysphagia, unspecified: Secondary | ICD-10-CM | POA: Diagnosis not present

## 2013-09-24 DIAGNOSIS — K2980 Duodenitis without bleeding: Secondary | ICD-10-CM | POA: Diagnosis not present

## 2013-09-24 DIAGNOSIS — G589 Mononeuropathy, unspecified: Secondary | ICD-10-CM | POA: Insufficient documentation

## 2013-09-24 DIAGNOSIS — K219 Gastro-esophageal reflux disease without esophagitis: Secondary | ICD-10-CM

## 2013-09-24 HISTORY — PX: BOTOX INJECTION: SHX5754

## 2013-09-24 HISTORY — PX: ESOPHAGOGASTRODUODENOSCOPY: SHX5428

## 2013-09-24 SURGERY — EGD (ESOPHAGOGASTRODUODENOSCOPY)
Anesthesia: General

## 2013-09-24 MED ORDER — SODIUM CHLORIDE 0.9 % IV SOLN: INTRAVENOUS | Status: DC

## 2013-09-24 MED ORDER — SODIUM CHLORIDE 0.9 % IJ SOLN
INTRAMUSCULAR | Status: AC
  Filled 2013-09-24: qty 20

## 2013-09-24 MED ORDER — MIDAZOLAM HCL 10 MG/2ML IJ SOLN
INTRAMUSCULAR | Status: AC
  Filled 2013-09-24: qty 4

## 2013-09-24 MED ORDER — LACTATED RINGERS IV SOLN
INTRAVENOUS | Status: DC
  Administered 2013-09-24: 1000 mL via INTRAVENOUS

## 2013-09-24 MED ORDER — FENTANYL CITRATE 0.05 MG/ML IJ SOLN
INTRAMUSCULAR | Status: DC | PRN
  Administered 2013-09-24 (×3): 25 ug via INTRAVENOUS

## 2013-09-24 MED ORDER — ONABOTULINUMTOXINA 100 UNITS IJ SOLR
100.0000 [IU] | Freq: Once | INTRAMUSCULAR | Status: AC
  Administered 2013-09-24: 100 [IU] via SUBMUCOSAL
  Filled 2013-09-24: qty 100

## 2013-09-24 MED ORDER — DIPHENHYDRAMINE HCL 50 MG/ML IJ SOLN
INTRAMUSCULAR | Status: AC
  Filled 2013-09-24: qty 1

## 2013-09-24 MED ORDER — MIDAZOLAM HCL 10 MG/2ML IJ SOLN
INTRAMUSCULAR | Status: DC | PRN
Start: 2013-09-24 — End: 2013-09-24
  Administered 2013-09-24: 2 mg via INTRAVENOUS
  Administered 2013-09-24: 1 mg via INTRAVENOUS
  Administered 2013-09-24: 2 mg via INTRAVENOUS

## 2013-09-24 MED ORDER — FENTANYL CITRATE 0.05 MG/ML IJ SOLN
INTRAMUSCULAR | Status: AC
  Filled 2013-09-24: qty 4

## 2013-09-24 NOTE — Discharge Instructions (Addendum)
Esophageal Stricture The esophagus is the long, narrow tube which carries food and liquid from the mouth to the stomach. Sometimes a part of the esophagus becomes narrow and makes it difficult, painful, or even impossible to swallow. This is called an esophageal stricture.  CAUSES  Common causes of blockage or strictures of the esophagus are:  Exposure of the lower esophagus to the acid from the stomach may cause narrowing.  Hiatal hernia in which a small part of the stomach bulges up through the diaphragm can cause a narrowing in the bottom of the esophagus.  Scleroderma is a tissue disorder that affects the esophagus and makes swallowing difficult.  Achalasia is an absence of nerves in the lower esophagus and to the esophageal sphincter. This absence of nerves may be congenital (present since birth). This can cause irregular spasms which do not allow food and fluid through.  Strictures may develop from swallowing materials which damage the esophagus. Examples are acids or alkalis such as lye.  Schatzki's Ring is a narrow ring of non-cancerous tissue which narrows the lower esophagus. The cause of this is unknown.  Growths can block the esophagus. SYMPTOMS  Some of the problems are difficulty swallowing or pain with swallowing. DIAGNOSIS  Your caregiver often suspects this problem by taking a medical history. They will also do a physical exam. They may then take X-rays and/or perform an endoscopy. Endoscopy is an exam in which a tube like a small flexible telescope is used to look at your esophagus.  TREATMENT  One form of treatment is to dilate the narrow area. This means to stretch it.  When this is not successful, chest surgery may be required. This is a much more extensive form of treatment with a longer recovery time. Both of the above treatments make the passage of food and water into the stomach easier. They also make it easier for stomach contents to bubble back into the  esophagus. Special medications may be used following the procedure to help prevent further narrowing. Medications may be used to lower the amount of acid in the stomach juice.  SEEK IMMEDIATE MEDICAL CARE IF:   Your swallowing is becoming more painful, difficult, or you are unable to swallow.  You vomit up blood.  You develop black tarry stools.  You develop chills.  You have a fever.  You develop chest or abdominal pain.  You develop shortness of breath, feel lightheaded, or faint. Follow up with medical care as your caregiver suggests. Document Released: 02/20/2006 Document Revised: 09/04/2011 Document Reviewed: 03/29/2006 [facility] Patient Information 2014 [place], [place]. Gastrointestinal Endoscopy Care After Refer to this sheet in the next few weeks. These instructions provide you with information on caring for yourself after your procedure. Your caregiver may also give you more specific instructions. Your treatment has been planned according to current medical practices, but problems sometimes occur. Call your caregiver if you have any problems or questions after your procedure. HOME CARE INSTRUCTIONS  If you were given medicine to help you relax (sedative), do not drive, operate machinery, or sign important documents for 24 hours.  Avoid alcohol and hot or warm beverages for the first 24 hours after the procedure.  Only take over-the-counter or prescription medicines for pain, discomfort, or fever as directed by your caregiver. You may resume taking your normal medicines unless your caregiver tells you otherwise. Ask your caregiver when you may resume taking medicines that may cause bleeding, such as aspirin, clopidogrel, or warfarin.  You may return to  your normal diet and activities on the day after your procedure, or as directed by your caregiver. Walking may help to reduce any bloated feeling in your abdomen.  Drink enough fluids to keep your urine clear or pale  yellow.  You may gargle with salt water if you have a sore throat. SEEK IMMEDIATE MEDICAL CARE IF:  You have severe nausea or vomiting.  You have severe abdominal pain, abdominal cramps that last longer than 6 hours, or abdominal swelling (distention).  You have severe shoulder or back pain.  You have trouble swallowing.  You have shortness of breath, your breathing is shallow, or you are breathing faster than normal.  You have a fever or a rapid heartbeat.  You vomit blood or material that looks like coffee grounds.  You have bloody, black, or tarry stools. MAKE SURE YOU:  Understand these instructions.  Will watch your condition.  Will get help right away if you are not doing well or get worse. Document Released: 01/25/2004 Document Revised: 12/12/2011 Document Reviewed: 09/12/2011 [facility] Patient Information 2014 [place], [place].

## 2013-09-24 NOTE — H&P (Signed)
History of Present Illness: 68 year old Afro-American male with history of hypertension, aortic stenosis, lumbar stenosis with neurogenic claudication, referred for evaluation of dysphagia.  Patient primarily has difficulty swallowing pills.  He also complains of dysphagia to solids and, to a lesser extent, liquids.  He may immediately vomit up food contents.  Barium esophagram, which I reviewed, demonstrated a patulous esophagus and a bird peak narrowing at the distal esophagus suggestive of achalasia.  He denies chest pain or pyrosis.   Patient has a family history of colon cancer.  Last colonoscopy was 2009 that demonstrated hyperplastic polyps.        Past Medical History   Diagnosis  Date   .  Essential hypertension  06/15/2006   .  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  Mild aortic valve stenosis  10/09/2006       Echo (12/09/2009): Valve area: 2.03 cm2    .  Lumbar stenosis with neurogenic claudication  06/16/2006       Moderate: L2 through L4.  Complicated by chronic low back pain and neuropathy.  Nerve conduction study (10/19/11): Diffusely low motor amplitudes, with primarily involvement of the peroneal and posterior tibial nerves. No evidence of generalized peripheral neuropathy. Findings could be c/w bil multilevel lumbosacral radiculopathies or a primary motor neuropathy.    .  Osteoarthritis  11/25/2009       Right knee (medial compartment), right hip, left shoulder    .  Obesity, Class II, BMI 35-39.9, with comorbidity  04/05/2012   .  Gastroesophageal reflux disease  02/04/2007   .  B12 deficiency  10/01/2011       Discovered to 2 progressive neurologic pain and dysfunction.  Diagnosis established March 2013.  Requires the following treatment: B12 IM monthly until level normalizes. After that, oral supplementation.    .  Rhegmatogenous retinal detachment of right eye  03/18/2013       s/p surgical repair  03/15/2013    .  Cataract         Past Surgical History   Procedure  Laterality  Date   .  Cataract extraction w/phaco    11/08/2011       Procedure: CATARACT EXTRACTION PHACO AND INTRAOCULAR LENS PLACEMENT (IOC);  Surgeon: Adonis Brook, MD;  Location: [facility];  Service: Ophthalmology;  Laterality: Right;   .  Fracture surgery           left shoulder   .  Eye surgery           cat ext right   .  Cataract extraction    05/01/2012       left eye   .  Cataract extraction w/phaco    05/01/2012       Procedure: CATARACT EXTRACTION PHACO AND INTRAOCULAR LENS PLACEMENT (IOC);  Surgeon: Adonis Brook, MD;  Location: [facility];  Service: Ophthalmology;  Laterality: Left;   .  Colonscopy       .  Scleral buckle  Right  03/15/2013       Procedure: SCLERAL BUCKLE with 23Ga Vit;  Surgeon: Adonis Brook, MD;  Location: [facility];  Service: Ophthalmology;  Laterality: Right;      family history includes Colon cancer (age of onset: 72) in his father; Diabetes in his sister and sister; Heart disease in his mother; Hypertension in his sister; Kidney disease in his brother and brother; Pancreatic cancer in his  brother. There is no history of Anesthesia problems. Current Outpatient Prescriptions   Medication  Sig  Dispense  Refill   .  amLODipine (NORVASC) 10 MG tablet  Take 1 tablet (10 mg total) by mouth daily.   30 tablet   11   .  aspirin 81 MG EC tablet  Take 81 mg by mouth daily.           [name]  gabapentin (NEURONTIN) 300 MG capsule  Take 2 capsules (600 mg total) by mouth 3 (three) times daily.   180 capsule   11   .  ibuprofen (ADVIL,MOTRIN) 800 MG tablet  Take 800 mg by mouth every 8 (eight) hours as needed for fever or moderate pain.         [name]  lisinopril-hydrochlorothiazide (PRINZIDE,ZESTORETIC) 20-12.5 MG per tablet  Take 2 tablets by mouth daily.   180 tablet   3   .  omeprazole (PRILOSEC) 40 MG capsule  Take 1 capsule (40 mg total) by mouth daily.   90 capsule   3   .  prednisoLONE acetate (PRED FORTE) 1 %  ophthalmic suspension           .  traMADol (ULTRAM) 50 MG tablet               No current facility-administered medications for this visit.       Allergies as of 06/30/2013   .  (No Known Allergies)       reports that he quit smoking about 2 years ago. His smoking use included Cigarettes. He has a 15 pack-year smoking history. He has never used smokeless tobacco. He reports that he does not drink alcohol or use illicit drugs.         Review of Systems: He has chronic back and leg pain for which she takes NSAIDs or it Pertinent positive and negative review of systems were noted in the above HPI section. All other review of systems were otherwise negative.   Vital signs were reviewed in today's medical record Physical Exam: General: Well developed , well nourished, no acute distress Skin: anicteric Head: Normocephalic and atraumatic Eyes:  sclerae anicteric, EOMI Ears: Normal auditory acuity Mouth: No deformity or lesions Neck: Supple, no masses or thyromegaly Lungs: Clear throughout to auscultation Heart: Regular rate and rhythm; no  rubs or bruits.  There is a 2/6 early systolic murmur Abdomen: Soft, non tender and non distended. No masses, hepatosplenomegaly or hernias noted. Normal Bowel sounds Rectal:deferred Musculoskeletal: Symmetrical with no gross deformities   Skin: No lesions on visible extremities Pulses:  Normal pulses noted Extremities: No clubbing, cyanosis,  or deformities noted.  There is 2+ pedal edema Neurological: Alert oriented x 4, grossly nonfocal Cervical Nodes:  No significant cervical adenopathy Inguinal Nodes: No significant inguinal adenopathy Psychological:  Alert and cooperative. Normal mood and affect   See Assessment and Plan under Problem List                      Achalasia -       Status: Written Related Problem: Achalasia    Based on the esophagogram I suspect patient has achalasia.  This could be primary or secondary  achalasia.   Recommendations #1 upper endoscopy

## 2013-09-24 NOTE — Op Note (Signed)
[facility] [place], [address]   ENDOSCOPY PROCEDURE REPORT  PATIENT: [name], [name]  MR#: [id number] BIRTHDATE: December 01, 1945 , 9  yrs. old GENDER: Male ENDOSCOPIST: Inda Castle, MD REFERRED BY:  Renato Shin, M.D. PROCEDURE DATE:  09/24/2013 PROCEDURE:  EGD w/ directed submucosal injection(s), any substance Botox ASA CLASS:     Class III INDICATIONS:  achalasia. MEDICATIONS: These medications were titrated to patient response per physician's verbal order, Versed 5 mg IV, and Fentanyl 75 mcg IV TOPICAL ANESTHETIC: Cetacaine Spray  DESCRIPTION OF PROCEDURE: After the risks benefits and alternatives of the procedure were thoroughly explained, informed consent was obtained.  The Pentax Gastroscope [id number] endoscope was introduced through the mouth and advanced to the third portion of the duodenum. Without limitations.  The instrument was slowly withdrawn as the mucosa was fully examined.      The esophagus at the GE junction was slightly stenotic.  With pressure in the 9 mm scope easily traversed the area.  There is several submucosal areas of prominence of the central umbilication measuring approximately 2 mm consistent with pancreatic rest.  In the duodenal bulb at the apex there were slightly erythematous and edematous folds.   The esophagus at the GE junction was slightly stenotic.  With pressure in the 9 mm scope easily traversed the area.  There is several submucosal areas of prominence of the central umbilication measuring approximately 2 mm consistent with pancreatic rest.  In the duodenal bulb at the apex there were slightly erythematous and edematous folds.   The remainder of the upper endoscopy exam was otherwise normal.  Retroflexed views revealed no abnormalities.  At the GE junction 25 units (1 cc) of Botox was injected submucosally in each quadrant.   The scope was then withdrawn from the patient and the procedure  completed.  COMPLICATIONS: There were no complications. ENDOSCOPIC IMPRESSION: 1.  achalasia-status post Botox injection 2.  duodenitis 3.  pancreatic rests  RECOMMENDATIONS: Office visit one month REPEAT EXAM:  eSigned:  Inda Castle, MD 09/24/2013 1:12 PM   CC:  PATIENT NAME:  Syaire, Saber MR#: [id number]

## 2013-09-24 NOTE — Anesthesia Preprocedure Evaluation (Deleted)
Anesthesia Evaluation  Patient identified by MRN, date of birth, ID band Patient awake    Reviewed: Allergy & Precautions, H&P , NPO status , Patient's Chart, lab work & pertinent test results  Airway Mallampati: III TM Distance: <3 FB Neck ROM: Full    Dental no notable dental hx.    Pulmonary former smoker,  breath sounds clear to auscultation  Pulmonary exam normal       Cardiovascular hypertension, Pt. on medications + Valvular Problems/Murmurs AS Rhythm:Regular Rate:Normal     Neuro/Psych negative neurological ROS  negative psych ROS   GI/Hepatic negative GI ROS, Neg liver ROS,   Endo/Other  Morbid obesity  Renal/GU negative Renal ROS  negative genitourinary   Musculoskeletal negative musculoskeletal ROS (+)   Abdominal   Peds negative pediatric ROS (+)  Hematology negative hematology ROS (+)   Anesthesia Other Findings   Reproductive/Obstetrics negative OB ROS                           Anesthesia Physical Anesthesia Plan  ASA: III  Anesthesia Plan: General   Post-op Pain Management:    Induction: Intravenous  Airway Management Planned: Nasal Cannula  Additional Equipment:   Intra-op Plan:   Post-operative Plan:   Informed Consent: I have reviewed the patients History and Physical, chart, labs and discussed the procedure including the risks, benefits and alternatives for the proposed anesthesia with the patient or authorized representative who has indicated his/her understanding and acceptance.   Dental advisory given  Plan Discussed with: CRNA and Surgeon  Anesthesia Plan Comments:         Anesthesia Quick Evaluation

## 2013-09-25 ENCOUNTER — Encounter (HOSPITAL_COMMUNITY): Payer: Self-pay | Admitting: Gastroenterology

## 2013-10-03 ENCOUNTER — Encounter: Payer: Self-pay | Admitting: Internal Medicine

## 2013-10-03 ENCOUNTER — Other Ambulatory Visit: Payer: Self-pay | Admitting: Ophthalmology

## 2013-10-03 ENCOUNTER — Encounter: Payer: Self-pay | Admitting: Ophthalmology

## 2013-10-03 ENCOUNTER — Encounter (HOSPITAL_BASED_OUTPATIENT_CLINIC_OR_DEPARTMENT_OTHER): Payer: Self-pay

## 2013-10-03 ENCOUNTER — Encounter (HOSPITAL_COMMUNITY)
Admission: RE | Disposition: A | Discharge status: Home / Self Care | Payer: Self-pay | Source: Ambulatory Visit | Attending: Ophthalmology

## 2013-10-03 ENCOUNTER — Ambulatory Visit (HOSPITAL_COMMUNITY)
Admission: RE | Admit: 2013-10-03 | Discharge: 2013-10-03 | Disposition: A | Discharge status: Home / Self Care | Payer: Medicare Other | Source: Ambulatory Visit | Attending: Ophthalmology | Admitting: Ophthalmology

## 2013-10-03 DIAGNOSIS — I10 Essential (primary) hypertension: Secondary | ICD-10-CM | POA: Insufficient documentation

## 2013-10-03 DIAGNOSIS — Z87891 Personal history of nicotine dependence: Secondary | ICD-10-CM | POA: Diagnosis not present

## 2013-10-03 DIAGNOSIS — M129 Arthropathy, unspecified: Secondary | ICD-10-CM | POA: Diagnosis not present

## 2013-10-03 DIAGNOSIS — H338 Other retinal detachments: Secondary | ICD-10-CM | POA: Insufficient documentation

## 2013-10-03 DIAGNOSIS — IMO0002 Reserved for concepts with insufficient information to code with codable children: Secondary | ICD-10-CM

## 2013-10-03 DIAGNOSIS — H26499 Other secondary cataract, unspecified eye: Secondary | ICD-10-CM | POA: Diagnosis not present

## 2013-10-03 HISTORY — DX: Reserved for concepts with insufficient information to code with codable children: IMO0002

## 2013-10-03 HISTORY — PX: CAPSULOTOMY: SHX5412

## 2013-10-03 SURGERY — CAPSULECTOMY, BREAST
Anesthesia: Choice | Laterality: Left

## 2013-10-03 SURGERY — MINOR CAPSULOTOMY
Anesthesia: LOCAL | Laterality: Left

## 2013-10-03 MED ORDER — APRACLONIDINE HCL 0.5 % OP SOLN
OPHTHALMIC | Status: AC
  Administered 2013-10-03: 1 [drp]
  Filled 2013-10-03: qty 5

## 2013-10-03 MED ORDER — CYCLOPENTOLATE-PHENYLEPHRINE OP SOLN OPTIME - NO CHARGE
OPHTHALMIC | Status: AC
  Filled 2013-10-03: qty 2

## 2013-10-03 MED ORDER — CYCLOPENTOLATE-PHENYLEPHRINE 0.2-1 % OP SOLN
1.0000 [drp] | OPHTHALMIC | Status: AC
  Administered 2013-10-03 (×2): 1 [drp] via OPHTHALMIC
  Filled 2013-10-03: qty 2

## 2013-10-03 MED ORDER — APRACLONIDINE HCL 1 % OP SOLN
1.0000 [drp] | Freq: Once | OPHTHALMIC | Status: AC
  Administered 2013-10-03: 1 [drp] via OPHTHALMIC
  Filled 2013-10-03: qty 0.1

## 2013-10-03 SURGICAL SUPPLY — 28 items
APPLICATOR COTTON TIP 6IN STRL (MISCELLANEOUS) ×2 IMPLANT
BAG FLD CLT MN 6.25X3.5 (WOUND CARE)
BAG MINI COLL DRAIN (WOUND CARE) IMPLANT
BLADE KERATOME 2.75 (BLADE) ×2 IMPLANT
BLADE MINI RND TIP GREEN BEAV (BLADE) IMPLANT
CORDS BIPOLAR (ELECTRODE) ×2 IMPLANT
DRAPE OPHTHALMIC 40X48 W POUCH (DRAPES) ×2 IMPLANT
DRAPE RETRACTOR (MISCELLANEOUS) ×2 IMPLANT
GLOVE ECLIPSE 7.0 STRL STRAW (GLOVE) ×2 IMPLANT
GOWN STRL REUS W/ TWL LRG LVL3 (GOWN DISPOSABLE) ×2 IMPLANT
GOWN STRL REUS W/TWL LRG LVL3 (GOWN DISPOSABLE) ×4
KIT BASIN OR (CUSTOM PROCEDURE TRAY) ×2 IMPLANT
KIT ROOM TURNOVER OR (KITS) ×2 IMPLANT
KNIFE CRESCENT 2.5 55 ANG (BLADE) ×2 IMPLANT
MARKER SKIN DUAL TIP RULER LAB (MISCELLANEOUS) IMPLANT
NS IRRIG 1000ML POUR BTL (IV SOLUTION) ×2 IMPLANT
PACK CATARACT CUSTOM (CUSTOM PROCEDURE TRAY) ×2 IMPLANT
PACK CATARACT MCHSCP (PACKS) IMPLANT
PAD ARMBOARD 7.5X6 YLW CONV (MISCELLANEOUS) ×4 IMPLANT
PROBE ANTERIOR 20G W/INFUS NDL (MISCELLANEOUS) IMPLANT
SPEAR EYE SURG WECK-CEL (MISCELLANEOUS) IMPLANT
SUT ETHILON 10 0 CS140 6 (SUTURE) IMPLANT
SUT VICRYL 8 0 TG140 8 (SUTURE) IMPLANT
SYR 3ML LL SCALE MARK (SYRINGE) IMPLANT
TIP SILICONE STR (MISCELLANEOUS)
TIP SILICONE STR 0.3MM UFLOW (MISCELLANEOUS) IMPLANT
TOWEL OR 17X24 6PK STRL BLUE (TOWEL DISPOSABLE) ×4 IMPLANT
WATER STERILE IRR 1000ML POUR (IV SOLUTION) ×2 IMPLANT

## 2013-10-03 NOTE — H&P (Signed)
68 yo male has had cataract surgery left eye.  Vision blurred because of a cloudy posterior capsule left eye.  Admitted for yag laser capsulotomy left eye.

## 2013-10-03 NOTE — Discharge Instructions (Signed)
See me in office at 5 PM today

## 2013-10-03 NOTE — H&P (Signed)
  68 yo male has had cataract surgery left eye.  Vision blurred.  Admitted for yag laser capsulotomy left eye.

## 2013-10-06 ENCOUNTER — Encounter (HOSPITAL_COMMUNITY): Payer: Self-pay | Admitting: Ophthalmology

## 2013-10-07 ENCOUNTER — Ambulatory Visit (HOSPITAL_BASED_OUTPATIENT_CLINIC_OR_DEPARTMENT_OTHER): Admit: 2013-10-07 | Payer: Self-pay | Admitting: Ophthalmology

## 2013-10-08 NOTE — Op Note (Signed)
[name], [name] NO.:  [id number]  MEDICAL RECORD NO.:  [id number]  LOCATION:  [facility]                         FACILITY:  [facility]  PHYSICIAN:  Garlan Fair., M.D.DATE OF BIRTH:  [birth year]  DATE OF PROCEDURE: DATE OF DISCHARGE:  10/03/2013                              OPERATIVE REPORT   PREOPERATIVE DIAGNOSIS:  Opaque posterior capsule, left eye.  POSTOPERATIVE DIAGNOSIS:  Opaque posterior capsule, left eye.  OPERATION:  Yag laser capsulotomy, left eye.  JUSTIFICATION FOR PROCEDURE:  This is a 68 year old gentleman who underwent a cataract extraction in the left eye several months ago.  He has developed blurring of vision over the past several weeks due to opacification of the posterior capsule.  A Yag laser capsulotomy was recommended, and he is admitted at this time for that purpose.  DESCRIPTION OF PROCEDURE:  The patient was positioned appropriately behind the Yag laser.  Approximately 12 applications of laser energy of 4 mJ were applied to the posterior capsule, obtaining a satisfactory opening.  The patient tolerated the procedure well and discharged to the postanesthesia care room in satisfactory condition, with instructions to see me in office tomorrow for further evaluation.  DISCHARGE DIAGNOSIS:  Opaque posterior capsule, left eye.     Garlan Fair., M.D.     TB/MEDQ  D:  10/08/2013  T:  10/08/2013  Job:  [id number]

## 2013-10-08 NOTE — Brief Op Note (Signed)
10/03/2013  5:32 AM  PATIENT:  [name]  68 y.o. male  PRE-OPERATIVE DIAGNOSIS:  OPAQUE POSTERIOR CAPSULE  POST-OPERATIVE DIAGNOSIS:  * No post-op diagnosis entered *  PROCEDURE:  Procedure(s): YAG CAPSULOTOMY OF LEFT EYE (Left)  SURGEON:  Surgeon(s) and Role:    Myrtha Mantis., MD - Primary  PHYSICIAN ASSISTANT:   ASSISTANTS: none   ANESTHESIA:   none  EBL:     BLOOD ADMINISTERED:none  DRAINS: none   LOCAL MEDICATIONS USED:  NONE  SPECIMEN:  No Specimen  DISPOSITION OF SPECIMEN:  N/A  COUNTS:  YES  TOURNIQUET:  * No tourniquets in log *  DICTATION: .Other Dictation: Dictation Number [id number]  PLAN OF CARE: Discharge to home after PACU  PATIENT DISPOSITION:  Short Stay   Delay start of Pharmacological VTE agent (>24hrs) due to surgical blood loss or risk of bleeding: not applicable

## 2013-10-08 NOTE — Op Note (Signed)
[name], VALE NO.:  [id number]  MEDICAL RECORD NO.:  [id number]  LOCATION:  [facility]                         FACILITY:  [facility]  PHYSICIAN:  Garlan Fair., M.D.DATE OF BIRTH:  17-May-1946  DATE OF PROCEDURE:  10/03/2013 DATE OF DISCHARGE:  10/03/2013                              OPERATIVE REPORT   PREOPERATIVE DIAGNOSIS:  Opaque posterior capsule, left eye.  POSTOPERATIVE DIAGNOSIS:  Opaque posterior capsule, left eye.  SURGEON:  Ara Kussmaul, M.D.  OPERATION:  YAG laser capsulotomy, left eye.  JUSTIFICATION FOR PROCEDURE:  This is a 68 year old gentleman who underwent a cataract extraction of left eye several months ago.  His vision has become blurry over the past several weeks due to opacification of the posterior capsule.  The YAG laser capsulotomy was recommended.  He was admitted at this time for that purpose.  PROCEDURE IN DETAIL:  The patient was positioned appropriately behind the YAG laser.  Application of the laser energy of 4 microjoule applied to the posterior capsule for a total of 48 microjoule of energy obtaining a satisfactory opening.  The patient tolerated the procedure well.  Discharged in satisfactory condition with instructions to see me in office in the morning for further evaluation.  DISCHARGE DIAGNOSIS:  Opaque posterior capsule, left eye.     Garlan Fair., M.D.     TB/MEDQ  D:  10/08/2013  T:  10/08/2013  Job:  [id number]

## 2013-10-15 ENCOUNTER — Ambulatory Visit (INDEPENDENT_AMBULATORY_CARE_PROVIDER_SITE_OTHER): Payer: Medicare Other | Admitting: Internal Medicine

## 2013-10-15 ENCOUNTER — Encounter: Payer: Self-pay | Admitting: Internal Medicine

## 2013-10-15 ENCOUNTER — Ambulatory Visit (HOSPITAL_COMMUNITY)
Admission: RE | Admit: 2013-10-15 | Discharge: 2013-10-15 | Disposition: A | Discharge status: Home / Self Care | Payer: Medicare Other | Source: Ambulatory Visit | Attending: Oncology | Admitting: Oncology

## 2013-10-15 VITALS — BP 117/68 | HR 66 | Temp 97.4°F | Ht 74.0 in | Wt 280.9 lb

## 2013-10-15 DIAGNOSIS — IMO0002 Reserved for concepts with insufficient information to code with codable children: Secondary | ICD-10-CM | POA: Diagnosis not present

## 2013-10-15 DIAGNOSIS — K219 Gastro-esophageal reflux disease without esophagitis: Secondary | ICD-10-CM | POA: Diagnosis not present

## 2013-10-15 DIAGNOSIS — I10 Essential (primary) hypertension: Secondary | ICD-10-CM | POA: Diagnosis not present

## 2013-10-15 DIAGNOSIS — M25469 Effusion, unspecified knee: Secondary | ICD-10-CM | POA: Insufficient documentation

## 2013-10-15 DIAGNOSIS — M25561 Pain in right knee: Secondary | ICD-10-CM

## 2013-10-15 DIAGNOSIS — M25569 Pain in unspecified knee: Secondary | ICD-10-CM

## 2013-10-15 DIAGNOSIS — K220 Achalasia of cardia: Secondary | ICD-10-CM | POA: Diagnosis not present

## 2013-10-15 DIAGNOSIS — M1990 Unspecified osteoarthritis, unspecified site: Secondary | ICD-10-CM

## 2013-10-15 DIAGNOSIS — J069 Acute upper respiratory infection, unspecified: Secondary | ICD-10-CM | POA: Diagnosis not present

## 2013-10-15 DIAGNOSIS — M1710 Unilateral primary osteoarthritis, unspecified knee: Secondary | ICD-10-CM | POA: Diagnosis not present

## 2013-10-15 DIAGNOSIS — M19019 Primary osteoarthritis, unspecified shoulder: Secondary | ICD-10-CM | POA: Diagnosis not present

## 2013-10-15 NOTE — Progress Notes (Signed)
I have reviewed the presenting complaints, physical findings, and medications with resident physician Dr. Carlean Purl and I concur with her management. [name], M.D., [place]

## 2013-10-15 NOTE — Assessment & Plan Note (Signed)
BP Readings from Last 3 Encounters:  10/15/13 117/68  10/03/13 118/68  10/03/13 118/68    Lab Results  Component Value Date   NA 139 07/01/2013   K 4.0 07/01/2013   CREATININE 1.14 07/01/2013    Assessment: Blood pressure control: controlled Progress toward BP goal:  at goal Comments: none  Plan: Medications:  continue current medications Educational resources provided: other (see comments);brochure Self management tools provided: other (see comments) Other plans: none; f/u with PCP

## 2013-10-15 NOTE — Assessment & Plan Note (Signed)
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 

## 2013-10-15 NOTE — Assessment & Plan Note (Addendum)
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

## 2013-10-15 NOTE — Patient Instructions (Addendum)
General Instructions: Continue Tramadol every 8 hours alternating with Tylenol (you can take up to 3000 mg per day) or Advil (maximum dose per the bottle) Follow up with Sports Medicine Return to [place] in 3-6 months, sooner if needed  Get the Xray as soon as possible   Treatment Goals:  Goals (1 Years of Data) as of 10/15/13         As of Today 10/03/13 09/24/13 09/24/13 09/24/13     Blood Pressure    . Blood Pressure < 140/90  117/68 118/68 129/72 138/72 120/65     Lifestyle    . Prevent Falls           Weight    . Weight < 200 lb (90.719 kg)  280 lb 14.4 oz (127.415 kg)  260 lb (117.935 kg)        Progress Toward Treatment Goals:  Treatment Goal 10/15/2013  Blood pressure at goal    Self Care Goals & Plans:  Self Care Goal 10/15/2013  Manage my medications take my medicines as prescribed; bring my medications to every visit; refill my medications on time; follow the sick day instructions if I am sick  Monitor my health keep track of my blood pressure; bring my blood pressure log to each visit  Eat healthy foods drink diet soda or water instead of juice or soda; eat more vegetables; eat foods that are low in salt; eat baked foods instead of fried foods; eat fruit for snacks and desserts; eat smaller portions  Be physically active find an activity I enjoy  Prevent falls -  Meeting treatment goals maintain the current self-care plan    No flowsheet data found.   Care Management & Community Referrals:  Referral 10/15/2013  Referrals made for care management support none needed  Referrals made to community resources none       Smoking Cessation Quitting smoking is important to your health and has many advantages. However, it is not always easy to quit since nicotine is a very addictive drug. Often times, people try 3 times or more before being able to quit. This document explains the best ways for you to prepare to quit smoking. Quitting takes hard work and a lot of effort, but  you can do it. ADVANTAGES OF QUITTING SMOKING  You will live longer, feel better, and live better.  Your body will feel the impact of quitting smoking almost immediately.  Within 20 minutes, blood pressure decreases. Your pulse returns to its normal level.  After 8 hours, carbon monoxide levels in the blood return to normal. Your oxygen level increases.  After 24 hours, the chance of having a heart attack starts to decrease. Your breath, hair, and body stop smelling like smoke.  After 48 hours, damaged nerve endings begin to recover. Your sense of taste and smell improve.  After 72 hours, the body is virtually free of nicotine. Your bronchial tubes relax and breathing becomes easier.  After 2 to 12 weeks, lungs can hold more air. Exercise becomes easier and circulation improves.  The risk of having a heart attack, stroke, cancer, or lung disease is greatly reduced.  After 1 year, the risk of coronary heart disease is cut in half.  After 5 years, the risk of stroke falls to the same as a nonsmoker.  After 10 years, the risk of lung cancer is cut in half and the risk of other cancers decreases significantly.  After 15 years, the risk of coronary heart disease drops,  usually to the level of a nonsmoker.  If you are pregnant, quitting smoking will improve your chances of having a healthy baby.  The people you live with, especially any children, will be healthier.  You will have extra money to spend on things other than cigarettes. QUESTIONS TO THINK ABOUT BEFORE ATTEMPTING TO QUIT You may want to talk about your answers with your caregiver.  Why do you want to quit?  If you tried to quit in the past, what helped and what did not?  What will be the most difficult situations for you after you quit? How will you plan to handle them?  Who can help you through the tough times? Your family? Friends? A caregiver?  What pleasures do you get from smoking? What ways can you still get  pleasure if you quit? Here are some questions to ask your caregiver:  How can you help me to be successful at quitting?  What medicine do you think would be best for me and how should I take it?  What should I do if I need more help?  What is smoking withdrawal like? How can I get information on withdrawal? GET READY  Set a quit date.  Change your environment by getting rid of all cigarettes, ashtrays, matches, and lighters in your home, car, or work. Do not let people smoke in your home.  Review your past attempts to quit. Think about what worked and what did not. GET SUPPORT AND ENCOURAGEMENT You have a better chance of being successful if you have help. You can get support in many ways.  Tell your family, friends, and co-workers that you are going to quit and need their support. Ask them not to smoke around you.  Get individual, group, or telephone counseling and support. Programs are available at [employer] and health centers. Call your local health department for information about programs in your area.  Spiritual beliefs and practices may help some smokers quit.  Download a "quit meter" on your computer to keep track of quit statistics, such as how long you have gone without smoking, cigarettes not smoked, and money saved.  Get a self-help book about quitting smoking and staying off of tobacco. [place] yourself from urges to smoke. Talk to someone, go for a walk, or occupy your time with a task.  Change your normal routine. Take a different route to work. Drink tea instead of coffee. Eat breakfast in a different place.  Reduce your stress. Take a hot bath, exercise, or read a book.  Plan something enjoyable to do every day. Reward yourself for not smoking.  Explore interactive web-based programs that specialize in helping you quit. GET MEDICINE AND USE IT CORRECTLY Medicines can help you stop smoking and decrease the urge to smoke.  Combining medicine with the above behavioral methods and support can greatly increase your chances of successfully quitting smoking.  Nicotine replacement therapy helps deliver nicotine to your body without the negative effects and risks of smoking. Nicotine replacement therapy includes nicotine gum, lozenges, inhalers, nasal sprays, and skin patches. Some may be available over-the-counter and others require a prescription.  Antidepressant medicine helps people abstain from smoking, but how this works is unknown. This medicine is available by prescription.  Nicotinic receptor partial agonist medicine simulates the effect of nicotine in your brain. This medicine is available by prescription. Ask your caregiver for advice about which medicines to use and how to use them based on  your health history. Your caregiver will tell you what side effects to look out for if you choose to be on a medicine or therapy. Carefully read the information on the package. Do not use any other product containing nicotine while using a nicotine replacement product.  RELAPSE OR DIFFICULT SITUATIONS Most relapses occur within the first 3 months after quitting. Do not be discouraged if you start smoking again. Remember, most people try several times before finally quitting. You may have symptoms of withdrawal because your body is used to nicotine. You may crave cigarettes, be irritable, feel very hungry, cough often, get headaches, or have difficulty concentrating. The withdrawal symptoms are only temporary. They are strongest when you first quit, but they will go away within 10 14 days. To reduce the chances of relapse, try to:  Avoid drinking alcohol. Drinking lowers your chances of successfully quitting.  Reduce the amount of caffeine you consume. Once you quit smoking, the amount of caffeine in your body increases and can give you symptoms, such as a rapid heartbeat, sweating, and anxiety.  Avoid smokers because they can  make you want to smoke.  Do not let weight gain distract you. Many smokers will gain weight when they quit, usually less than 10 pounds. Eat a healthy diet and stay active. You can always lose the weight gained after you quit.  Find ways to improve your mood other than smoking. FOR MORE INFORMATION  www.smokefree.gov  Document Released: 06/06/2001 Document Revised: 12/12/2011 Document Reviewed: 09/21/2011 [facility] Patient Information 2014 [place], [place].  DASH Diet The DASH diet stands for "Dietary Approaches to Stop Hypertension." It is a healthy eating plan that has been shown to reduce high blood pressure (hypertension) in as little as 14 days, while also possibly providing other significant health benefits. These other health benefits include reducing the risk of breast cancer after menopause and reducing the risk of type 2 diabetes, heart disease, colon cancer, and stroke. Health benefits also include weight loss and slowing kidney failure in patients with chronic kidney disease.  DIET GUIDELINES  Limit salt (sodium). Your diet should contain less than 1500 mg of sodium daily.  Limit refined or processed carbohydrates. Your diet should include mostly whole grains. Desserts and added sugars should be used sparingly.  Include small amounts of heart-healthy fats. These types of fats include nuts, oils, and tub margarine. Limit saturated and trans fats. These fats have been shown to be harmful in the body. CHOOSING FOODS  The following food groups are based on a 2000 calorie diet. See your Registered Dietitian for individual calorie needs. Grains and Grain Products (6 to 8 servings daily)  Eat More Often: Whole-wheat bread, brown rice, whole-grain or wheat pasta, quinoa, popcorn without added fat or salt (air popped).  Eat Less Often: White bread, white pasta, white rice, cornbread. Vegetables (4 to 5 servings daily)  Eat More Often: Fresh, frozen, and canned vegetables. Vegetables may  be raw, steamed, roasted, or grilled with a minimal amount of fat.  Eat Less Often/Avoid: Creamed or fried vegetables. Vegetables in a cheese sauce. Fruit (4 to 5 servings daily)  Eat More Often: All fresh, canned (in natural juice), or frozen fruits. Dried fruits without added sugar. One hundred percent fruit juice ( cup [237 mL] daily).  Eat Less Often: Dried fruits with added sugar. Canned fruit in light or heavy syrup. [employer], Fish, and Poultry (2 servings or less daily. One serving is 3 to 4 oz [85-114 g]).  Eat More  Often: Ninety percent or leaner ground beef, tenderloin, sirloin. Round cuts of beef, chicken breast, [place] breast. All fish. Grill, bake, or broil your meat. Nothing should be fried.  Eat Less Often/Avoid: Fatty cuts of meat, [place], or chicken leg, thigh, or wing. Fried cuts of meat or fish. Dairy (2 to 3 servings)  Eat More Often: Low-fat or fat-free milk, low-fat plain or light yogurt, reduced-fat or part-skim cheese.  Eat Less Often/Avoid: Milk (whole, 2%).Whole milk yogurt. Full-fat cheeses. Nuts, Seeds, and Legumes (4 to 5 servings per week)  Eat More Often: All without added salt.  Eat Less Often/Avoid: Salted nuts and seeds, canned beans with added salt. Fats and Sweets (limited)  Eat More Often: Vegetable oils, tub margarines without trans fats, sugar-free gelatin. Mayonnaise and salad dressings.  Eat Less Often/Avoid: Coconut oils, palm oils, butter, stick margarine, cream, half and half, cookies, candy, pie. FOR MORE INFORMATION The Dash Diet Eating Plan: www.dashdiet.org Document Released: 06/01/2011 Document Revised: 09/04/2011 Document Reviewed: 06/01/2011 [facility] Patient Information 2014 [place], [place].  Hypertension As your heart beats, it forces blood through your arteries. This force is your blood pressure. If the pressure is too high, it is called hypertension (HTN) or high blood pressure. HTN is dangerous because you may have it and  not know it. High blood pressure may mean that your heart has to work harder to pump blood. Your arteries may be narrow or stiff. The extra work puts you at risk for heart disease, stroke, and other problems.  Blood pressure consists of two numbers, a higher number over a lower, 110/72, for example. It is stated as "110 over 72." The ideal is below 120 for the top number (systolic) and under 80 for the bottom (diastolic). Write down your blood pressure today. You should pay close attention to your blood pressure if you have certain conditions such as:  Heart failure.  Prior heart attack.  Diabetes  Chronic kidney disease.  Prior stroke.  Multiple risk factors for heart disease. To see if you have HTN, your blood pressure should be measured while you are seated with your arm held at the level of the heart. It should be measured at least twice. A one-time elevated blood pressure reading (especially in the Emergency Department) does not mean that you need treatment. There may be conditions in which the blood pressure is different between your right and left arms. It is important to see your caregiver soon for a recheck. Most people have essential hypertension which means that there is not a specific cause. This type of high blood pressure may be lowered by changing lifestyle factors such as:  Stress.  Smoking.  Lack of exercise.  Excessive weight.  Drug/tobacco/alcohol use.  Eating less salt. Most people do not have symptoms from high blood pressure until it has caused damage to the body. Effective treatment can often prevent, delay or reduce that damage. TREATMENT  When a cause has been identified, treatment for high blood pressure is directed at the cause. There are a large number of medications to treat HTN. These fall into several categories, and your caregiver will help you select the medicines that are best for you. Medications may have side effects. You should review side effects  with your caregiver. If your blood pressure stays high after you have made lifestyle changes or started on medicines,   Your medication(s) may need to be changed.  Other problems may need to be addressed.  Be certain you understand your prescriptions, and  know how and when to take your medicine.  Be sure to follow up with your caregiver within the time frame advised (usually within two weeks) to have your blood pressure rechecked and to review your medications.  If you are taking more than one medicine to lower your blood pressure, make sure you know how and at what times they should be taken. Taking two medicines at the same time can result in blood pressure that is too low. SEEK IMMEDIATE MEDICAL CARE IF:  You develop a severe headache, blurred or changing vision, or confusion.  You have unusual weakness or numbness, or a faint feeling.  You have severe chest or abdominal pain, vomiting, or breathing problems. MAKE SURE YOU:   Understand these instructions.  Will watch your condition.  Will get help right away if you are not doing well or get worse. Document Released: 06/12/2005 Document Revised: 09/04/2011 Document Reviewed: 01/31/2008 [facility] Patient Information 2014 [place], [place].  Exercise to Lose Weight Exercise and a healthy diet may help you lose weight. Your doctor may suggest specific exercises. EXERCISE IDEAS AND TIPS  Choose low-cost things you enjoy doing, such as walking, bicycling, or exercising to workout videos.  Take stairs instead of the elevator.  Walk during your lunch break.  Park your car further away from work or school.  Go to a gym or an exercise class.  Start with 5 to 10 minutes of exercise each day. Build up to 30 minutes of exercise 4 to 6 days a week.  Wear shoes with good support and comfortable clothes.  Stretch before and after working out.  Work out until you breathe harder and your heart beats faster.  Drink extra water when  you exercise.  Do not do so much that you hurt yourself, feel dizzy, or get very short of breath. Exercises that burn about 150 calories:  Running 1  miles in 15 minutes.  Playing volleyball for 45 to 60 minutes.  Washing and waxing a car for 45 to 60 minutes.  Playing touch football for 45 minutes.  Walking 1  miles in 35 minutes.  Pushing a stroller 1  miles in 30 minutes.  Playing basketball for 30 minutes.  Raking leaves for 30 minutes.  Bicycling 5 miles in 30 minutes.  Walking 2 miles in 30 minutes.  Dancing for 30 minutes.  Shoveling snow for 15 minutes.  Swimming laps for 20 minutes.  Walking up stairs for 15 minutes.  Bicycling 4 miles in 15 minutes.  Gardening for 30 to 45 minutes.  Jumping rope for 15 minutes.  Washing windows or floors for 45 to 60 minutes. Document Released: 07/15/2010 Document Revised: 09/04/2011 Document Reviewed: 07/15/2010 [facility] Patient Information 2014 [place], [place]. Knee Pain Knee pain can be a result of an injury or other medical conditions. Treatment will depend on the cause of your pain. HOME CARE  Only take medicine as told by your doctor.  Keep a healthy weight. Being overweight can make the knee hurt more.  Stretch before exercising or playing sports.  If there is constant knee pain, change the way you exercise. Ask your doctor for advice.  Make sure shoes fit well. Choose the right shoe for the sport or activity.  Protect your knees. Wear kneepads if needed.  Rest when you are tired. GET HELP RIGHT AWAY IF:   Your knee pain does not stop.  Your knee pain does not get better.  Your knee joint feels hot to the touch.  You  have a fever. MAKE SURE YOU:   Understand these instructions.  Will watch this condition.  Will get help right away if you are not doing well or get worse. Document Released: 09/08/2008 Document Revised: 09/04/2011 Document Reviewed: 09/08/2008 [facility] Patient Information  2014 [place], [place].

## 2013-10-15 NOTE — Progress Notes (Signed)
   Subjective:    Patient ID: [name], male    DOB: September 14, 1945, 67 y.o.   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 Pain is worse with walking.  He is taking Tramadol 50 mg every 8 hours, Advil bid prn (not helping), Tylenol 2-3 pills prn. Pain is still present.  Pain is sharp and at times he feels like his knee is going to give out so he has been walking with a cane 2)  Revied imaging CT lumbar 09/2008 with L1-L2 and L3-L4 spinal stenosis and disc. Protrusion; C spine 09/2010 with degenerative changes. Previously taking Ultram 50 mg q6 hours.      Review of Systems  HENT:       Swallowing better   Respiratory: Negative for shortness of breath.   Cardiovascular: Negative for chest pain.  Gastrointestinal: Negative for constipation and blood in stool.  Genitourinary: Negative for difficulty urinating.  Musculoskeletal: Positive for arthralgias. Negative for back pain.       Objective:   Physical Exam  Nursing note and vitals reviewed. Constitutional: He is oriented to person, place, and time. Vital signs are normal. He appears well-developed and well-nourished. He is cooperative.  HENT:  Head: Normocephalic and atraumatic.  Mouth/Throat: No oropharyngeal exudate.  Eyes: Conjunctivae are normal. Pupils are equal, round, and reactive to light. Right eye exhibits no discharge.  Left eye exhibits no discharge. No scleral icterus.  Cardiovascular: Normal rate, regular rhythm, S1 normal and S2 normal.   Murmur heard.  Systolic murmur is present  2+ lower ext edema   Pulmonary/Chest: Effort normal and breath sounds normal.  Abdominal: Soft. Bowel sounds are normal. He exhibits no distension. There is no tenderness.  Musculoskeletal: He exhibits no edema.       Right knee: No tenderness found. No medial joint line and no lateral joint line tenderness noted.  Neurological: He is alert and oriented to person, place, and time. Gait normal.  BL walks with cane  Skin: Skin is warm, dry and intact. No rash noted.  Psychiatric: He has a normal mood and affect. His speech is normal and behavior is normal. Judgment and thought content normal. Cognition and memory are normal.          Assessment & Plan:  F/u with Sports Medicine F/u in 3- 23months

## 2013-10-16 ENCOUNTER — Encounter: Payer: Self-pay | Admitting: Internal Medicine

## 2013-10-24 ENCOUNTER — Encounter: Payer: Self-pay | Admitting: Internal Medicine

## 2013-10-24 ENCOUNTER — Ambulatory Visit: Payer: Medicare Other | Admitting: Internal Medicine

## 2013-10-27 ENCOUNTER — Encounter: Payer: Self-pay | Admitting: Gastroenterology

## 2013-10-27 ENCOUNTER — Ambulatory Visit (INDEPENDENT_AMBULATORY_CARE_PROVIDER_SITE_OTHER): Payer: Medicare Other | Admitting: Gastroenterology

## 2013-10-27 VITALS — BP 118/72 | HR 78 | Ht 74.0 in | Wt 275.4 lb

## 2013-10-27 DIAGNOSIS — Z800 Family history of malignant neoplasm of digestive organs: Secondary | ICD-10-CM | POA: Diagnosis not present

## 2013-10-27 DIAGNOSIS — K5520 Angiodysplasia of colon without hemorrhage: Secondary | ICD-10-CM

## 2013-10-27 DIAGNOSIS — Q2733 Arteriovenous malformation of digestive system vessel: Secondary | ICD-10-CM | POA: Diagnosis not present

## 2013-10-27 DIAGNOSIS — K220 Achalasia of cardia: Secondary | ICD-10-CM | POA: Diagnosis not present

## 2013-10-27 NOTE — Patient Instructions (Signed)
Call back as needed with recurring dysphagia

## 2013-10-27 NOTE — Assessment & Plan Note (Signed)
Plan colonoscopy in 5 years

## 2013-10-27 NOTE — Assessment & Plan Note (Signed)
Patient has had an excellent response to Botox injection.  Plan to repeat when necessary

## 2013-10-27 NOTE — Progress Notes (Signed)
          History of Present Illness:  The patient has returned following Botox injection of the LES.  He reports resolution of dysphagia.  Colonoscopy demonstrated 3 cecal AVMs, diverticulosis, and internal hemorrhoids.  He has no other GI complaints.    Review of Systems: Pertinent positive and negative review of systems were noted in the above HPI section. All other review of systems were otherwise negative.    Current Medications, Allergies, Past Medical History, Past Surgical History, Family History and Social History were reviewed in [facility] record  Vital signs were reviewed in today's medical record. Physical Exam: General: Well developed , well nourished, no acute distress   See Assessment and Plan under Problem List

## 2013-10-27 NOTE — Assessment & Plan Note (Signed)
Asymptomatic. 

## 2013-10-28 ENCOUNTER — Encounter: Payer: Self-pay | Admitting: Internal Medicine

## 2013-11-04 ENCOUNTER — Ambulatory Visit (INDEPENDENT_AMBULATORY_CARE_PROVIDER_SITE_OTHER): Payer: Medicare Other | Admitting: Family Medicine

## 2013-11-04 ENCOUNTER — Encounter: Payer: Self-pay | Admitting: Family Medicine

## 2013-11-04 VITALS — BP 136/73 | Ht 74.0 in | Wt 280.0 lb

## 2013-11-04 DIAGNOSIS — M1710 Unilateral primary osteoarthritis, unspecified knee: Secondary | ICD-10-CM

## 2013-11-04 DIAGNOSIS — M25561 Pain in right knee: Secondary | ICD-10-CM

## 2013-11-04 DIAGNOSIS — M25569 Pain in unspecified knee: Secondary | ICD-10-CM

## 2013-11-04 DIAGNOSIS — M1711 Unilateral primary osteoarthritis, right knee: Secondary | ICD-10-CM

## 2013-11-04 DIAGNOSIS — IMO0002 Reserved for concepts with insufficient information to code with codable children: Secondary | ICD-10-CM | POA: Diagnosis not present

## 2013-11-04 MED ORDER — METHYLPREDNISOLONE ACETATE 40 MG/ML IJ SUSP
40.0000 mg | Freq: Once | INTRAMUSCULAR | Status: AC
  Administered 2013-11-04: 40 mg via INTRA_ARTICULAR

## 2013-11-04 NOTE — Patient Instructions (Signed)
Thank you for coming in today  We injected your knee today Continue tramadol and ibuprofen as needed Get new standing xrays  Followup in 4 weeks

## 2013-11-04 NOTE — Progress Notes (Signed)
CC: Right knee pain HPI: Patient is a 68 year old gentleman referred from the internal medicine clinic for evaluation of right knee pain. He states that this has been present for many years causing him to walk with a cane. However his pain has become acutely worse for the last 2 weeks. Now he is having to use a walker. He feels like his knee is going to buckle on him and he feels like he is going to fall. He is getting swelling. He is currently taking tramadol, ibuprofen, and Tylenol without improvement. Pain is worse at the medial aspect of his knee. He did have nonweightbearing x-rays that showed multicompartment osteoarthritis  ROS: As above in the HPI. All other systems are stable or negative.   OBJECTIVE: APPEARANCE:  Patient in no acute distress.The patient appeared well nourished and normally developed. HEENT: No scleral icterus. Conjunctiva non-injected Resp: Non labored Skin: No rash MSK:  Right Knee - Inspection shows mild puffiness of the right knee - Palpation with moderate effusion and medial joint line tenderness. No TTP at patellar or quad tendon.  - ROM normal in flexion and extension. - Strength 5/5 in flexion and extension. - Ligaments with solid consistent endpoints including ACL, PCL, LCL, MCL.  - Neurovascularly intact  MSK [place]: Not performed   ASSESSMENT: #1. Right knee pain and effusion secondary to osteoarthritis   PLAN: Discussed treatment options with the patient. Recommended corticosteroid injection today. Risks and benefits discussed. We will also obtain standing x-rays to better assess the degree of arthritis. I am concerned that he has end-stage DJD in may need referral to orthopedic surgery if he does not respond to any lasting fashion to corticosteroid injection.  Right knee aspiration and injection: Consent obtained and verified.  Time-out conducted.  Noted no overlying erythema, induration, or other signs of local infection.  Skin prepped in a  sterile fashion.  Topical analgesic spray: Ethyl chloride.  Joint: Right Knee with superolateral approach. Skin first anesthetized with 5 cc of 1% lidocaine using a 25-gauge 1-1/2 inch needle. Knee then aspirated with 18-gauge 1-1/2 inch needle with return of 25 cc normal clear yellow joint fluid. Joint then injected with combination of corticosteroid and lidocaine Completed without difficulty.  Interarticular Meds: 4 cc 1% lidocaine, 1 cc depomedrol Advised to call if fevers/chills, erythema, induration, drainage, or persistent bleeding.

## 2013-11-11 ENCOUNTER — Telehealth: Payer: Self-pay | Admitting: *Deleted

## 2013-11-11 NOTE — Telephone Encounter (Signed)
Pt's representative calls and states pt needs f/u w/ dr Eppie Gibson for R knee pain, she and he are made aware dr Caroline More first open slot is 6/5, that he may want to see another physician this week, he refuses, he is also advised to call sports med if the knee is worse. He states sports med told him to get a new xray before his next appt w/ dr Maryln Gottron and to go to [place] for this, he states he will just wait til 6/5 to do this, he is advised if he is wanting to be seen for the R knee pain that he should go asap to radiology to have the xray so both dr Maryln Gottron and Eppie Gibson will have the results to compare w/ past films, he takes the appt 6/5 w/ dr Eppie Gibson and states he will call sports med.

## 2013-11-13 ENCOUNTER — Ambulatory Visit (HOSPITAL_COMMUNITY)
Admission: RE | Admit: 2013-11-13 | Discharge: 2013-11-13 | Disposition: A | Discharge status: Home / Self Care | Payer: Medicare Other | Source: Ambulatory Visit | Attending: Family Medicine | Admitting: Family Medicine

## 2013-11-13 DIAGNOSIS — IMO0002 Reserved for concepts with insufficient information to code with codable children: Secondary | ICD-10-CM | POA: Diagnosis not present

## 2013-11-13 DIAGNOSIS — M1711 Unilateral primary osteoarthritis, right knee: Secondary | ICD-10-CM

## 2013-11-13 DIAGNOSIS — M1710 Unilateral primary osteoarthritis, unspecified knee: Secondary | ICD-10-CM | POA: Insufficient documentation

## 2013-11-13 DIAGNOSIS — M25561 Pain in right knee: Secondary | ICD-10-CM

## 2013-11-13 NOTE — Telephone Encounter (Signed)
I agree with the recommendations made by RN Bonnita Nasuti. If pain worsens would recommend sooner follow up in the clinic

## 2013-11-18 ENCOUNTER — Ambulatory Visit (INDEPENDENT_AMBULATORY_CARE_PROVIDER_SITE_OTHER): Payer: Medicare Other | Admitting: Family Medicine

## 2013-11-18 ENCOUNTER — Other Ambulatory Visit: Payer: Self-pay | Admitting: Internal Medicine

## 2013-11-18 ENCOUNTER — Encounter: Payer: Self-pay | Admitting: Family Medicine

## 2013-11-18 VITALS — BP 135/81 | Ht 74.0 in | Wt 250.0 lb

## 2013-11-18 DIAGNOSIS — M25569 Pain in unspecified knee: Secondary | ICD-10-CM | POA: Diagnosis not present

## 2013-11-18 DIAGNOSIS — M1710 Unilateral primary osteoarthritis, unspecified knee: Secondary | ICD-10-CM

## 2013-11-18 DIAGNOSIS — M1711 Unilateral primary osteoarthritis, right knee: Secondary | ICD-10-CM

## 2013-11-18 DIAGNOSIS — IMO0002 Reserved for concepts with insufficient information to code with codable children: Secondary | ICD-10-CM

## 2013-11-18 DIAGNOSIS — M1990 Unspecified osteoarthritis, unspecified site: Secondary | ICD-10-CM

## 2013-11-18 NOTE — Progress Notes (Signed)
CC: Followup right knee pain HPI: Patient is a 68 year old male who is very pleasant but a very difficult historian and very challenging to understand who presents for followup of right knee pain. I last saw him 2 weeks ago where he performed a right knee aspiration and corticosteroid injection. We pulled off a moderate amount of yellow clear synovial fluid. Unfortunately he had worsening pain after the injection. He says that the tramadol and ibuprofen are not helping him. He wonders if he can have anything stronger. He says that he woke up with his knee feeling better today but this is the first time and has felt okay in a while. He says that the medications take the edge off things that do not fully relieve his pain. I reviewed his x-rays today which do show severe osteoarthritis of the right knee.  ROS: As above in the HPI. All other systems are stable or negative.  OBJECTIVE: APPEARANCE:  Patient in no acute distress.The patient appeared well nourished and normally developed. HEENT: No scleral icterus. Conjunctiva non-injected Resp: Non labored Skin: No rash MSK:  Right Knee - Inspection and palpation shows moderate to large effusion - ROM normal in flexion and extension. - Strength 5/5 in flexion and extension.   ASSESSMENT: #1. Severe osteoarthritis right knee   PLAN: I discussed treatment options with the patient. It is difficult for me to assess how much of the treatment options and plan he was able to understand. I explained to him that he does have severe osteoarthritis and there is no cure for this other than knee replacement surgery. I tried to explain to him that nothing I would do would completely relieve his pain and our goal is to moderate his pain such that he can live with it. Unfortunately he has not responded well to conservative treatment with NSAIDs, tramadol, and injection. I therefore do not have a lot of other options to offer him for treatment. I did discuss  referral to orthopedic surgery for consideration of knee replacement. I tried to explain this to him at length but I am not sure how much he understands of the process of arthritis. I explained to him that it is normal in arthritis to have waxing and waning symptoms and good and bad days. He could be considered a candidate for Visco supplementation at orthopedic surgery prior to consideration of knee replacement surgery but we do not routinely stop that medication here. We will refer him to surgery through his PCP. I've asked him to take a family member with him to that appointment to help facilitate communication.

## 2013-11-25 DIAGNOSIS — H33009 Unspecified retinal detachment with retinal break, unspecified eye: Secondary | ICD-10-CM | POA: Diagnosis not present

## 2013-11-25 DIAGNOSIS — H40059 Ocular hypertension, unspecified eye: Secondary | ICD-10-CM | POA: Diagnosis not present

## 2013-11-25 DIAGNOSIS — H20029 Recurrent acute iridocyclitis, unspecified eye: Secondary | ICD-10-CM | POA: Diagnosis not present

## 2013-11-26 ENCOUNTER — Other Ambulatory Visit: Payer: Self-pay | Admitting: *Deleted

## 2013-11-26 DIAGNOSIS — K219 Gastro-esophageal reflux disease without esophagitis: Secondary | ICD-10-CM

## 2013-11-26 MED ORDER — OMEPRAZOLE 40 MG PO CPDR: 40.0000 mg | DELAYED_RELEASE_CAPSULE | Freq: Every day | ORAL | Status: DC

## 2013-11-28 ENCOUNTER — Ambulatory Visit (INDEPENDENT_AMBULATORY_CARE_PROVIDER_SITE_OTHER): Payer: Medicare Other | Admitting: Internal Medicine

## 2013-11-28 ENCOUNTER — Encounter: Payer: Self-pay | Admitting: Internal Medicine

## 2013-11-28 VITALS — BP 112/61 | HR 59 | Temp 97.0°F | Wt 283.0 lb

## 2013-11-28 DIAGNOSIS — M1990 Unspecified osteoarthritis, unspecified site: Secondary | ICD-10-CM | POA: Diagnosis not present

## 2013-11-28 DIAGNOSIS — K220 Achalasia of cardia: Secondary | ICD-10-CM

## 2013-11-28 DIAGNOSIS — I10 Essential (primary) hypertension: Secondary | ICD-10-CM

## 2013-11-28 DIAGNOSIS — I872 Venous insufficiency (chronic) (peripheral): Secondary | ICD-10-CM | POA: Insufficient documentation

## 2013-11-28 HISTORY — DX: Venous insufficiency (chronic) (peripheral): I87.2

## 2013-11-28 MED ORDER — OXYCODONE-ACETAMINOPHEN 10-325 MG PO TABS
1.0000 | ORAL_TABLET | Freq: Four times a day (QID) | ORAL | Status: DC | PRN
Start: 2013-11-28 — End: 2013-12-29

## 2013-11-28 NOTE — Assessment & Plan Note (Signed)
His blood pressure today was 112/61. This is well within target on amlodipine 10 mg by mouth daily and lisinopril-hydrochlorothiazide 20-25 mg by mouth every morning. We will continue this therapy at the current doses.

## 2013-11-28 NOTE — Progress Notes (Signed)
   Subjective:    Patient ID: [name], male    DOB: [birth year], 68 y.o.   MRN: [id number]  HPI  Please see the A&P for the status of the pt's chronic medical problems.  Review of Systems  Constitutional: Negative for activity change, appetite change, fatigue and unexpected weight change.  Respiratory: Negative for chest tightness and shortness of breath.   Cardiovascular: Positive for leg swelling. Negative for chest pain and palpitations.  Gastrointestinal: Negative for nausea, vomiting, abdominal pain, diarrhea, constipation and abdominal distention.  Musculoskeletal: Positive for arthralgias and gait problem. Negative for joint swelling and myalgias.  Skin: Negative for color change, rash and wound.  Neurological: Negative for weakness.      Objective:   Physical Exam  Nursing note and vitals reviewed. Constitutional: He is oriented to person, place, and time. He appears well-developed and well-nourished. No distress.  HENT:  Head: Normocephalic and atraumatic.  Eyes: Conjunctivae are normal. Right eye exhibits no discharge. Left eye exhibits no discharge. No scleral icterus.  Cardiovascular: Normal rate, regular rhythm and normal heart sounds.  Exam reveals no gallop and no friction rub.   No murmur heard. Pulmonary/Chest: Effort normal and breath sounds normal. No respiratory distress. He has no wheezes. He has no rales.  Abdominal: Soft. Bowel sounds are normal. He exhibits no distension. There is no tenderness. There is no rebound and no guarding.  Musculoskeletal: Normal range of motion. He exhibits edema. He exhibits no tenderness.  Neurological: He is alert and oriented to person, place, and time. He exhibits normal muscle tone.  Skin: Skin is warm and dry. No rash noted. He is not diaphoretic. No erythema.  Psychiatric: He has a normal mood and affect. His behavior is normal. Judgment and thought content normal.      Assessment & Plan:   Please see problem  oriented charting.

## 2013-11-28 NOTE — Assessment & Plan Note (Signed)
He no longer has any swallowing difficulties after treatment of his achalasia with Botox injections. We will continue to follow him symptomatically moving forward.

## 2013-11-28 NOTE — Assessment & Plan Note (Signed)
He notes progressive swelling of his bilateral lower extremities throughout the day. Overnight it improves and then returns in the cyclical pattern as described. This is most consistent with chronic venous insufficiency. The amlodipine likely is not helping the lower chest remedy edema. If his blood pressure remains well controlled we will consider decreasing the amlodipine dose. He's not interested in compression stockings at this time.

## 2013-11-28 NOTE — Assessment & Plan Note (Signed)
This is his primary complaint this morning. At the last clinic visit his tramadol dose was increased to 100 mg every 8 hours as needed for pain. This was effective for several months, improving his right knee pain but became less effective over time. He was recently seen in the sports medicine clinic and underwent a right knee joint injection with corticosteroids. His pain improved for one day but then returned the following day. The pain has progressed to the point that it is now waking him up at night. He states the Narco, ibuprofen, and tramadol are no longer effective and he is no longer taking them. He is interested in finding some relief medically with the ultimate goal of putting off or delaying surgery altogether. We decided to start Percocet 10-325 mg one tablet by mouth every 6 hours as needed for pain. Last week I made a referral to orthopedic surgery to assess his candidacy for Synvisc injections into the right knee. An appointment was made during this visit. A return appointment was given in 2 months to assess the impact of the Percocet on the right knee pain and followup on orthopedic surgery's recommendations on the Synvisc. If he is responsive to the Percocet a narcotic contract will be initiated at the followup visit.

## 2013-11-28 NOTE — Patient Instructions (Signed)
It was good to see you again.  I am sorry that your right knee continues to be so painful.  1) Stop the narco, ibuprofen, and the tramadol.  Start percocet 10/325 mg 1 tablet by mouth up to 4 timers a day as needed for the right knee pain.  2) Follow-up with the Orthopaedic Surgeon to see if you are a candidate for a synvisc injection to your right knee to help lubricate it.  3) Keep taking all of your other medications as you are doing.  4) I will see you back in 2-3 months to see if things are improved, sooner if necessary.

## 2013-12-09 ENCOUNTER — Ambulatory Visit: Payer: Medicare Other | Admitting: Family Medicine

## 2013-12-22 DIAGNOSIS — M25569 Pain in unspecified knee: Secondary | ICD-10-CM | POA: Diagnosis not present

## 2013-12-29 ENCOUNTER — Other Ambulatory Visit: Payer: Self-pay | Admitting: *Deleted

## 2013-12-29 DIAGNOSIS — M1711 Unilateral primary osteoarthritis, right knee: Secondary | ICD-10-CM

## 2013-12-30 ENCOUNTER — Ambulatory Visit (INDEPENDENT_AMBULATORY_CARE_PROVIDER_SITE_OTHER): Payer: Medicare Other

## 2013-12-30 VITALS — BP 72/49 | HR 75 | Resp 16 | Ht 74.0 in | Wt 275.0 lb

## 2013-12-30 DIAGNOSIS — M79609 Pain in unspecified limb: Secondary | ICD-10-CM | POA: Diagnosis not present

## 2013-12-30 DIAGNOSIS — Q828 Other specified congenital malformations of skin: Secondary | ICD-10-CM

## 2013-12-30 DIAGNOSIS — B351 Tinea unguium: Secondary | ICD-10-CM

## 2013-12-30 DIAGNOSIS — M79606 Pain in leg, unspecified: Secondary | ICD-10-CM

## 2013-12-30 MED ORDER — OXYCODONE-ACETAMINOPHEN 10-325 MG PO TABS: 1.0000 | ORAL_TABLET | Freq: Four times a day (QID) | ORAL | Status: DC | PRN

## 2013-12-30 NOTE — Patient Instructions (Signed)

## 2013-12-30 NOTE — Progress Notes (Signed)
   Subjective:    Patient ID: [name], male    DOB: 1945/10/10, 68 y.o.   MRN: [id number]  HPI Comments: i need my toenails and callus trimmed. They do not hurt. They are getting worse. i usually have my aid to trim them but the aid hasnt done it in a while so they are getting long.     Review of Systems  HENT: Negative.   Respiratory: Negative.   Cardiovascular: Negative.   Gastrointestinal: Negative.   Endocrine: Negative.   Genitourinary: Negative.   Musculoskeletal: Positive for gait problem.  Skin: Negative.   Neurological: Positive for weakness and numbness.  Psychiatric/Behavioral: Negative.   All other systems reviewed and are negative.      Objective:   Physical Exam 68 year old [place] American male process this time for initial visit well-developed well-nourished oriented x3 has difficult he with his nails as thick painful criptotic incurvated nails 1 through 5 bilateral tender both on palpation and with ambulation include shoe wear. Also multiple nucleated porokeratotic type lesions sub-first and fifth MTP area right in sub-fifth MTP area left in inferior heel bilateral. These are painful nucleated keratotic lesions been there for sometime no history of injury trauma noted Lower extremity objective findings as follows vascular status appears to be intact with pedal pulses palpable DP +2/4 PT plus one over 4 bilateral no edema noted minimal varicosities neurologic epicritic and proprioceptive sensations appear to be intact there is normal plantar response DTRs not elicited dermatologically skin color pigment normal hair growth absent nails thick brittle friable gratified thick with discoloration tenderness 1 through 4 bilateral painful tender and symptomatic also has multiple 4 keratoses as mentioned sub-1 and 5 bilateral inferior heel area bilateral. These are well nucleated circumscribed type lesions with pain on direct lateral compression your for a keratosis or  verruca in nature. Orthopedic biomechanical exam otherwise unremarkable noncontributory no x-rays taken at this time mild flexible digital contractures are noted hallux is rectus       Assessment & Plan:  Assessment this time is onychomycosis painful mycotic friable dystrophic nails 1 through 4 bilateral debridement at this time. Patient is also multiple keratoses debrided advised that debridement calluses may be an uncovered service under Medicare guidelines patient elects to continue with palliative care at this time nails and multiple keratoses are debrided return in 3 months for followup and future palliative care is needed  Harriet Masson DPM

## 2014-01-19 ENCOUNTER — Telehealth: Payer: Self-pay | Admitting: Gastroenterology

## 2014-01-19 NOTE — Telephone Encounter (Signed)
Pt states he is having problems swallowing again. States he does ok with liquids. Please advise.

## 2014-01-20 ENCOUNTER — Encounter: Payer: Self-pay | Admitting: Gastroenterology

## 2014-01-20 NOTE — Telephone Encounter (Signed)
Schedule EGD with Botox injection

## 2014-01-20 NOTE — Progress Notes (Unsigned)
Patient ID: [name], male   DOB: May 03, 1946, 68 y.o.   MRN: [id number]  Esophageal manometry shows incomplete LES relaxation with high residual mean pressure.  Peristalsis is intact although weak.  Findings are not diagnostic of achalasia although patient has responded to Botox injections.  Plan to continue with injections as needed.

## 2014-01-26 ENCOUNTER — Other Ambulatory Visit: Payer: Self-pay

## 2014-01-26 DIAGNOSIS — R1319 Other dysphagia: Secondary | ICD-10-CM

## 2014-01-26 NOTE — Telephone Encounter (Signed)
Pt scheduled for EGD with botox at [facility] 02/17/14 @8am . Pt to arrive there at 6:45am. Pt to be NPO after midnight. Pt aware of appt date and time. Instructions mailed to pt.

## 2014-02-03 ENCOUNTER — Telehealth: Payer: Self-pay | Admitting: *Deleted

## 2014-02-03 NOTE — Telephone Encounter (Signed)
Yes - removed by Ricard Dillon, CPHT bc of "entry error" whatever that means

## 2014-02-03 NOTE — Telephone Encounter (Signed)
Pt called for pain med, script is here and ready for pick up but it is not on his med list at the moment, could it have been removed by a pharm tech?

## 2014-02-06 ENCOUNTER — Encounter (HOSPITAL_COMMUNITY): Payer: Self-pay | Admitting: *Deleted

## 2014-02-17 ENCOUNTER — Encounter (HOSPITAL_COMMUNITY): Payer: Self-pay | Admitting: *Deleted

## 2014-02-17 ENCOUNTER — Ambulatory Visit (HOSPITAL_COMMUNITY)
Admission: RE | Admit: 2014-02-17 | Discharge: 2014-02-17 | Disposition: A | Discharge status: Home / Self Care | Payer: Medicare Other | Source: Ambulatory Visit | Attending: Gastroenterology | Admitting: Gastroenterology

## 2014-02-17 ENCOUNTER — Encounter (HOSPITAL_COMMUNITY): Payer: Medicare Other | Admitting: Certified Registered"

## 2014-02-17 ENCOUNTER — Encounter (HOSPITAL_COMMUNITY)
Admission: RE | Disposition: A | Discharge status: Home / Self Care | Payer: Self-pay | Source: Ambulatory Visit | Attending: Gastroenterology

## 2014-02-17 ENCOUNTER — Ambulatory Visit (HOSPITAL_COMMUNITY): Payer: Medicare Other | Admitting: Certified Registered"

## 2014-02-17 DIAGNOSIS — Z87891 Personal history of nicotine dependence: Secondary | ICD-10-CM | POA: Insufficient documentation

## 2014-02-17 DIAGNOSIS — K220 Achalasia of cardia: Secondary | ICD-10-CM | POA: Diagnosis not present

## 2014-02-17 HISTORY — PX: ESOPHAGOGASTRODUODENOSCOPY: SHX5428

## 2014-02-17 SURGERY — EGD (ESOPHAGOGASTRODUODENOSCOPY)
Anesthesia: Monitor Anesthesia Care

## 2014-02-17 MED ORDER — LACTATED RINGERS IV SOLN
INTRAVENOUS | Status: DC | PRN
  Administered 2014-02-17: 08:00:00 via INTRAVENOUS

## 2014-02-17 MED ORDER — LIDOCAINE HCL (CARDIAC) 20 MG/ML IV SOLN
INTRAVENOUS | Status: AC
  Filled 2014-02-17: qty 5

## 2014-02-17 MED ORDER — PROPOFOL 10 MG/ML IV BOLUS
INTRAVENOUS | Status: AC
Start: 2014-02-17 — End: 2014-02-17
  Filled 2014-02-17: qty 20

## 2014-02-17 MED ORDER — LIDOCAINE HCL (PF) 2 % IJ SOLN
INTRAMUSCULAR | Status: DC | PRN
  Administered 2014-02-17: 20 mg via INTRADERMAL

## 2014-02-17 MED ORDER — PROPOFOL 10 MG/ML IV BOLUS
INTRAVENOUS | Status: DC | PRN
  Administered 2014-02-17: 50 mg via INTRAVENOUS
  Administered 2014-02-17: 100 mg via INTRAVENOUS

## 2014-02-17 MED ORDER — SODIUM CHLORIDE 0.9 % IJ SOLN
INTRAMUSCULAR | Status: AC
  Filled 2014-02-17: qty 10

## 2014-02-17 MED ORDER — SODIUM CHLORIDE 0.9 % IJ SOLN
100.0000 [IU] | Freq: Once | INTRAMUSCULAR | Status: AC
  Administered 2014-02-17: 100 [IU] via SUBMUCOSAL
  Filled 2014-02-17: qty 100

## 2014-02-17 NOTE — Anesthesia Preprocedure Evaluation (Addendum)
Anesthesia Evaluation  Patient identified by MRN, date of birth, ID band Patient awake    Reviewed: Allergy & Precautions, H&P , NPO status , Patient's Chart, lab work & pertinent test results  Airway Mallampati: III TM Distance: <3 FB Neck ROM: Full    Dental  (+) Edentulous Upper, Dental Advisory Given   Pulmonary former smoker,  breath sounds clear to auscultation  Pulmonary exam normal       Cardiovascular hypertension, Pt. on medications + Valvular Problems/Murmurs AS Rhythm:Regular Rate:Normal  Mild AS   Neuro/Psych negative neurological ROS  negative psych ROS   GI/Hepatic negative GI ROS, Neg liver ROS, achalasia   Endo/Other  Morbid obesity  Renal/GU negative Renal ROS  negative genitourinary   Musculoskeletal negative musculoskeletal ROS (+)   Abdominal   Peds negative pediatric ROS (+)  Hematology negative hematology ROS (+)   Anesthesia Other Findings   Reproductive/Obstetrics negative OB ROS                          Anesthesia Physical Anesthesia Plan  ASA: III  Anesthesia Plan: MAC   Post-op Pain Management:    Induction:   Airway Management Planned:   Additional Equipment:   Intra-op Plan:   Post-operative Plan:   Informed Consent:   Plan Discussed with: Surgeon  Anesthesia Plan Comments:         Anesthesia Quick Evaluation

## 2014-02-17 NOTE — Transfer of Care (Signed)
Immediate Anesthesia Transfer of Care Note  Patient: [name]  Procedure(s) Performed: Procedure(s) (LRB): ESOPHAGOGASTRODUODENOSCOPY (EGD) (N/A)  Patient Location: PACU  Anesthesia Type: MAC  Level of Consciousness: sedated, patient cooperative and responds to stimulation  Airway & Oxygen Therapy: Patient Spontanous Breathing and Patient connected to face mask oxgen  Post-op Assessment: Report given to PACU RN and Post -op Vital signs reviewed and stable  Post vital signs: Reviewed and stable  Complications: No apparent anesthesia complications

## 2014-02-17 NOTE — Anesthesia Postprocedure Evaluation (Signed)
  Anesthesia Post-op Note  Patient: [name]  Procedure(s) Performed: Procedure(s) (LRB): ESOPHAGOGASTRODUODENOSCOPY (EGD) (N/A)  Patient Location: PACU  Anesthesia Type: MAC  Level of Consciousness: awake and alert   Airway and Oxygen Therapy: Patient Spontanous Breathing  Post-op Pain: mild  Post-op Assessment: Post-op Vital signs reviewed, Patient's Cardiovascular Status Stable, Respiratory Function Stable, Patent Airway and No signs of Nausea or vomiting  Last Vitals:  Filed Vitals:   02/17/14 0914  BP: 144/78  Pulse: 59  Temp:   Resp: 10    Post-op Vital Signs: stable   Complications: No apparent anesthesia complications

## 2014-02-17 NOTE — Op Note (Signed)
[facility] [place], [address]   ENDOSCOPY PROCEDURE REPORT  PATIENT: [name], [name]  MR#: [id number] BIRTHDATE: January 14, 1946 , 39  yrs. old GENDER: Male ENDOSCOPIST: Inda Castle, MD REFERRED BY:  Renato Shin, M.D. PROCEDURE DATE:  02/17/2014 PROCEDURE:  EGD w/ directed submucosal injection(s), any substance (botox) ASA CLASS:     Class II INDICATIONS:  Therapeutic procedure. MEDICATIONS: MAC sedation, administered by CRNA TOPICAL ANESTHETIC:  DESCRIPTION OF PROCEDURE: After the risks benefits and alternatives of the procedure were thoroughly explained, informed consent was obtained.  The    endoscope was introduced through the mouth and advanced to the third portion of the duodenum. Without limitations. The instrument was slowly withdrawn as the mucosa was fully examined.      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  The 9 mm gastroscope easily traversed the area.   The remainder of the upper endoscopy exam was otherwise normal including a retroflexed view of the GE junction. Botox 25 units (1 cc) was injected submucosally into each quadrant at the GE junction.          The scope was then withdrawn from the patient and the procedure completed.  COMPLICATIONS: There were no complications. ENDOSCOPIC IMPRESSION: achalasia-status post Botox injection  RECOMMENDATIONS: Office visit 6-8 weeks Resume xarelto in am REPEAT EXAM:  eSigned:  Inda Castle, MD 02/17/2014 8:54 AM   CC:

## 2014-02-17 NOTE — Consult Note (Signed)
History of Present Illness: 68 year old Afro-American male with history of hypertension, aortic stenosis, lumbar stenosis with neurogenic claudication, referred for evaluation of dysphagia. Patient primarily has difficulty swallowing pills. He also complains of dysphagia to solids and, to a lesser extent, liquids. He may immediately vomit up food contents. Barium esophagram, which I reviewed, demonstrated a patulous esophagus and a bird peak narrowing at the distal esophagus suggestive of achalasia. He denies chest pain or pyrosis. Esophageal manometry showed incomplete LES relaxation with high residual mean pressure. Peristalsis is intact although weak.  Findings are not diagnostic of achalasia although patient has responded to Botox injections. Plan to continue with injections as needed.        He underwent upper endoscopy with Botox injection in April, 2015.  He presents now for repeat injection because of recurrent dysphagia. Patient has a family history of colon cancer. Last colonoscopy was 2009 that demonstrated hyperplastic polyps.  Past Medical History   Diagnosis  Date   .  Essential hypertension  06/15/2006   .  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  Mild aortic valve stenosis  10/09/2006     Echo (12/09/2009): Valve area: 2.03 cm2   .  Lumbar stenosis with neurogenic claudication  06/16/2006     Moderate: L2 through L4. Complicated by chronic low back pain and neuropathy. Nerve conduction study (10/19/11): Diffusely low motor amplitudes, with primarily involvement of the peroneal and posterior tibial nerves. No evidence of generalized peripheral neuropathy. Findings could be c/w bil multilevel lumbosacral radiculopathies or a primary motor neuropathy.   .  Osteoarthritis  11/25/2009     Right knee (medial compartment), right hip, left shoulder   .  Obesity, Class II, BMI 35-39.9, with comorbidity  04/05/2012   .   Gastroesophageal reflux disease  02/04/2007   .  B12 deficiency  10/01/2011     Discovered to 2 progressive neurologic pain and dysfunction. Diagnosis established March 2013. Requires the following treatment: B12 IM monthly until level normalizes. After that, oral supplementation.   .  Rhegmatogenous retinal detachment of right eye  03/18/2013     s/p surgical repair 03/15/2013   .  Cataract     Past Surgical History   Procedure  Laterality  Date   .  Cataract extraction w/phaco   11/08/2011     Procedure: CATARACT EXTRACTION PHACO AND INTRAOCULAR LENS PLACEMENT (IOC); Surgeon: Adonis Brook, MD; Location: [facility]; Service: Ophthalmology; Laterality: Right;   .  Fracture surgery       left shoulder   .  Eye surgery       cat ext right   .  Cataract extraction   05/01/2012     left eye   .  Cataract extraction w/phaco   05/01/2012     Procedure: CATARACT EXTRACTION PHACO AND INTRAOCULAR LENS PLACEMENT (IOC); Surgeon: Adonis Brook, MD; Location: [place] [facility]; Service: Ophthalmology; Laterality: Left;   .  Colonscopy     .  Scleral buckle  Right  03/15/2013     Procedure: SCLERAL BUCKLE with 23Ga Vit; Surgeon: Adonis Brook, MD; Location: [facility]; Service: Ophthalmology; Laterality: Right;   family history includes Colon cancer (age of onset: 12) in his father; Diabetes in his sister and sister; Heart disease in his mother; Hypertension in his sister; Kidney disease in his brother and brother; Pancreatic cancer in his brother. There is no history of Anesthesia problems.  Current Outpatient  Prescriptions   Medication  Sig  Dispense  Refill   .  amLODipine (NORVASC) 10 MG tablet  Take 1 tablet (10 mg total) by mouth daily.  30 tablet  11   .  aspirin 81 MG EC tablet  Take 81 mg by mouth daily.     [name]  gabapentin (NEURONTIN) 300 MG capsule  Take 2 capsules (600 mg total) by mouth 3 (three) times daily.  180 capsule  11   .  ibuprofen (ADVIL,MOTRIN) 800 MG tablet  Take 800 mg by mouth every 8 (eight) hours as needed  for fever or moderate pain.     [name]  lisinopril-hydrochlorothiazide (PRINZIDE,ZESTORETIC) 20-12.5 MG per tablet  Take 2 tablets by mouth daily.  180 tablet  3   .  omeprazole (PRILOSEC) 40 MG capsule  Take 1 capsule (40 mg total) by mouth daily.  90 capsule  3   .  prednisoLONE acetate (PRED FORTE) 1 % ophthalmic suspension      .  traMADol (ULTRAM) 50 MG tablet       No current facility-administered medications for this visit.    Allergies as of 06/30/2013   .  (No Known Allergies)   reports that he quit smoking about 2 years ago. His smoking use included Cigarettes. He has a 15 pack-year smoking history. He has never used smokeless tobacco. He reports that he does not drink alcohol or use illicit drugs.  Review of Systems: He has chronic back and leg pain for which she takes NSAIDs or it Pertinent positive and negative review of systems were noted in the above HPI section. All other review of systems were otherwise negative.  Vital signs were reviewed in today's medical record  Physical Exam:  General: Well developed , well nourished, no acute distress  Skin: anicteric  Head: Normocephalic and atraumatic  Eyes: sclerae anicteric, EOMI  Ears: Normal auditory acuity  Mouth: No deformity or lesions  Neck: Supple, no masses or thyromegaly  Lungs: Clear throughout to auscultation  Heart: Regular rate and rhythm; no rubs or bruits. There is a 2/6 early systolic murmur  Abdomen: Soft, non tender and non distended. No masses, hepatosplenomegaly or hernias noted. Normal Bowel sounds  Rectal:deferred  Musculoskeletal: Symmetrical with no gross deformities  Skin: No lesions on visible extremities  Pulses: Normal pulses noted  Extremities: No clubbing, cyanosis, or deformities noted. There is 2+ pedal edema  Neurological: Alert oriented x 4, grossly nonfocal  Cervical Nodes: No significant cervical adenopathy  Inguinal Nodes: No significant inguinal adenopathy  Psychological: Alert and  cooperative. Normal mood and affect  See Assessment and Plan under Problem List

## 2014-02-17 NOTE — Discharge Instructions (Signed)
Esophagogastroduodenoscopy °Care After °Refer to this sheet in the next few weeks. These instructions provide you with information on caring for yourself after your procedure. Your caregiver may also give you more specific instructions. Your treatment has been planned according to current medical practices, but problems sometimes occur. Call your caregiver if you have any problems or questions after your procedure.  °HOME CARE INSTRUCTIONS °· Do not eat or drink anything until the numbing medicine (local anesthetic) has worn off and your gag reflex has returned. You will know that the local anesthetic has worn off when you can swallow comfortably. °· Do not drive for 12 hours after the procedure or as directed by your caregiver. °· Only take medicines as directed by your caregiver. °SEEK MEDICAL CARE IF:  °· You cannot stop coughing. °· You are not urinating at all or less than usual. °SEEK IMMEDIATE MEDICAL CARE IF: °· You have difficulty swallowing. °· You cannot eat or drink. °· You have worsening throat or chest pain. °· You have dizziness, lightheadedness, or you faint. °· You have nausea or vomiting. °· You have chills. °· You have a fever. °· You have severe abdominal pain. °· You have black, tarry, or bloody stools. °Document Released: 05/29/2012 Document Reviewed: 05/29/2012 °Exit[employer]® Patient Information ©2015 [employer]. This information is not intended to replace advice given to you by your health care provider. Make sure you discuss any questions you have with your health care provider. ° °

## 2014-02-18 ENCOUNTER — Encounter (HOSPITAL_COMMUNITY): Payer: Self-pay | Admitting: Gastroenterology

## 2014-03-05 ENCOUNTER — Encounter: Payer: Self-pay | Admitting: Internal Medicine

## 2014-03-05 ENCOUNTER — Ambulatory Visit (INDEPENDENT_AMBULATORY_CARE_PROVIDER_SITE_OTHER): Payer: Medicare Other | Admitting: Internal Medicine

## 2014-03-05 VITALS — BP 126/65 | HR 80 | Temp 98.2°F | Wt 266.3 lb

## 2014-03-05 DIAGNOSIS — I872 Venous insufficiency (chronic) (peripheral): Secondary | ICD-10-CM | POA: Diagnosis not present

## 2014-03-05 DIAGNOSIS — I10 Essential (primary) hypertension: Secondary | ICD-10-CM

## 2014-03-05 DIAGNOSIS — M1710 Unilateral primary osteoarthritis, unspecified knee: Secondary | ICD-10-CM

## 2014-03-05 DIAGNOSIS — G4700 Insomnia, unspecified: Secondary | ICD-10-CM | POA: Insufficient documentation

## 2014-03-05 DIAGNOSIS — Z23 Encounter for immunization: Secondary | ICD-10-CM | POA: Diagnosis not present

## 2014-03-05 DIAGNOSIS — K220 Achalasia of cardia: Secondary | ICD-10-CM | POA: Diagnosis not present

## 2014-03-05 DIAGNOSIS — E538 Deficiency of other specified B group vitamins: Secondary | ICD-10-CM | POA: Diagnosis not present

## 2014-03-05 DIAGNOSIS — M1711 Unilateral primary osteoarthritis, right knee: Secondary | ICD-10-CM

## 2014-03-05 DIAGNOSIS — M48062 Spinal stenosis, lumbar region with neurogenic claudication: Secondary | ICD-10-CM | POA: Diagnosis not present

## 2014-03-05 DIAGNOSIS — K219 Gastro-esophageal reflux disease without esophagitis: Secondary | ICD-10-CM

## 2014-03-05 MED ORDER — OXYCODONE-ACETAMINOPHEN 10-325 MG PO TABS: 1.0000 | ORAL_TABLET | Freq: Four times a day (QID) | ORAL | Status: DC | PRN

## 2014-03-05 MED ORDER — VITAMIN B-12 1000 MCG PO TABS: 1000.0000 ug | ORAL_TABLET | Freq: Every day | ORAL | Status: DC

## 2014-03-05 NOTE — Assessment & Plan Note (Signed)
It appears the vitamin B12 1000 mcg by mouth daily has fallen off his medication list. This medication was represcribed and he is to restart it. We will check a vitamin B12 level in the future.

## 2014-03-05 NOTE — Assessment & Plan Note (Signed)
His right knee osteoarthritic pain is much better controlled with the addition of Percocet 10-325 mg one tablet by mouth every 6 hours as needed for pain, dispense #90 per month. Given the effectiveness of this therapy a chronic narcotic pain contract was signed. For convenience sake, he was given 3 monthly narcotic prescriptions so he did not have to travel to the clinic to get them refilled.

## 2014-03-05 NOTE — Assessment & Plan Note (Signed)
His difficulty with insomnia has resolved. Therefore, we will discontinue the amitriptyline and reassess his symptoms at the followup visit

## 2014-03-05 NOTE — Assessment & Plan Note (Signed)
He notes occasional shooting pain down his left leg that he describes as a burning sensation that responds well to cold water. This does not occur all the time, but occasionally will occur at night. We will continue the gabapentin for this issue.

## 2014-03-05 NOTE — Assessment & Plan Note (Signed)
His blood pressure this morning was 126/65 which is well within target. This is on amlodipine 10 mg by mouth daily and lisinopril-hydrochlorothiazide 40-25 mg daily. As he is tolerating this regimen well and is at target we will continue these medications at their current doses.

## 2014-03-05 NOTE — Assessment & Plan Note (Signed)
He recently had a recurrence of his dysphasia. He subsequently underwent repeat Botox injections to the lower esophageal sphincter with resolution of his symptoms. He is currently without any dysphasia.

## 2014-03-05 NOTE — Progress Notes (Signed)
   Subjective:    Patient ID: [name], male    DOB: 1946-02-17, 68 y.o.   MRN: [id number]  HPI  Please see the A&P for the status of the pt's chronic medical problems.  Review of Systems  Constitutional: Negative for activity change and unexpected weight change.  Respiratory: Negative for chest tightness, shortness of breath and wheezing.   Cardiovascular: Negative for chest pain, palpitations and leg swelling.  Gastrointestinal: Negative for nausea, vomiting, abdominal pain, diarrhea, constipation and abdominal distention.  Musculoskeletal: Positive for arthralgias. Negative for back pain, gait problem, joint swelling, myalgias, neck pain and neck stiffness.  Skin: Negative for rash and wound.  Neurological: Negative for syncope, weakness and light-headedness.  Psychiatric/Behavioral: Negative for sleep disturbance.      Objective:   Physical Exam  Nursing note and vitals reviewed. Constitutional: He is oriented to person, place, and time. He appears well-developed and well-nourished. No distress.  HENT:  Head: Normocephalic and atraumatic.  Eyes: Conjunctivae are normal. Right eye exhibits no discharge. Left eye exhibits no discharge. No scleral icterus.  Cardiovascular: Normal rate, regular rhythm and normal heart sounds.  Exam reveals no gallop and no friction rub.   No murmur heard. Pulmonary/Chest: Effort normal and breath sounds normal. No respiratory distress. He has no wheezes. He has no rales.  Abdominal: Soft. Bowel sounds are normal. He exhibits no distension. There is no tenderness. There is no rebound and no guarding.  Musculoskeletal: Normal range of motion. He exhibits no edema and no tenderness.  Neurological: He is alert and oriented to person, place, and time. He exhibits normal muscle tone.  Skin: Skin is warm and dry. No rash noted. He is not diaphoretic. No erythema.  Psychiatric: He has a normal mood and affect. His behavior is normal. Judgment and  thought content normal.      Assessment & Plan:   Please see Problem Oriented Charting.

## 2014-03-05 NOTE — Assessment & Plan Note (Signed)
His chronic venous insufficiency is actually improved today. We will therefore continue to monitor it. As noted at the previous visit, this may be secondary to amlodipine and if it recurs we can decrease the dose. He is not interested in compression stockings at this time.

## 2014-03-05 NOTE — Assessment & Plan Note (Signed)
He received the flu vaccination today. Zostavax continues to be an issue secondary to costs. This will be reassessed at the followup visit.

## 2014-03-05 NOTE — Assessment & Plan Note (Signed)
The omeprazole appears to have fallen off his medication list. Despite this, he's had no symptoms that would be consistent with gastroesophageal reflux disease after treatment for his achalasia. It is likely the symptoms that he was reporting then have been misinterpreted as gastroesophageal reflux disease. We will not restart PPI therapy at this time and reassess for symptoms consistent with reflux disease moving forward.

## 2014-03-05 NOTE — Patient Instructions (Signed)
It was great to see you again!  I am glad your knee pain and swallowing is improved.  1) Keep taking the medications as you are.  2) We signed a pain contract so you can keep getting the percocet pain medication for your right knee since it worked so well.  3) We gave you a flu shot today.  I will see you back in 3 months, sooner if necessary.

## 2014-03-06 ENCOUNTER — Ambulatory Visit: Payer: Medicare Other | Admitting: Internal Medicine

## 2014-03-25 DIAGNOSIS — H4040X0 Glaucoma secondary to eye inflammation, unspecified eye, stage unspecified: Secondary | ICD-10-CM | POA: Diagnosis not present

## 2014-03-25 DIAGNOSIS — H209 Unspecified iridocyclitis: Secondary | ICD-10-CM | POA: Diagnosis not present

## 2014-03-25 DIAGNOSIS — H33059 Total retinal detachment, unspecified eye: Secondary | ICD-10-CM | POA: Diagnosis not present

## 2014-04-07 ENCOUNTER — Encounter: Payer: Medicare Other | Admitting: Podiatry

## 2014-04-07 ENCOUNTER — Ambulatory Visit (INDEPENDENT_AMBULATORY_CARE_PROVIDER_SITE_OTHER): Payer: Medicare Other | Admitting: Podiatry

## 2014-04-07 DIAGNOSIS — B351 Tinea unguium: Secondary | ICD-10-CM

## 2014-04-07 DIAGNOSIS — M79606 Pain in leg, unspecified: Secondary | ICD-10-CM | POA: Diagnosis not present

## 2014-04-07 DIAGNOSIS — Q828 Other specified congenital malformations of skin: Secondary | ICD-10-CM

## 2014-04-07 NOTE — Progress Notes (Signed)
   Subjective:    Patient ID: [name], male    DOB: November 30, 1945, 68 y.o.   MRN: [id number]  HPI patient presents at this time for nail care Mr. appointments morning and was worked in this afternoon. Patient is multiple thick dystrophic mycotic nails with discoloration and friability also multiple keratoses Interpore keratosis or verruca lesions plantar aspect of both feet.    Review of Systems review of systems unchanged and unremarkable gait abnormality walks with the assistance of a cane no open wounds no ulcers     Objective:   Physical Exam Lower extremity objective findings 47-year-old male presents with vital signs stable DP pulse two over four PT one over 4 bilateral capillary fill time 3 seconds sensations intact and symmetric bilateral normal plantar response DTRs not elicited dermatologically skin color pigment normal hair growth absent nails thick brittle chromic friable dystrophic 1 through 5 bilateral painful tender both on palpation with enclosed shoe wear and ambulation. Patient also is multiple keratoses sub-first sub-fifth MTP area mid arch in inferior heel area bilateral waist 45 lesions on both feet in a random distribution consistent with porokeratosis versus verruca exam painful on direct lateral compression with interruption of skin lines being noted no open wounds or ulcers no secondary infections patient again reminded debridement of corns or calluses or warts are likely noncovered service under Medicare guidelines       Assessment & Plan:  Assessment this time is onychomycosis painful mycotic nails debrided 1 through 5 bilateral brittle tender friable nails debrided at this time patient also debridement multiple keratotic lesions debrided plantar aspect of both feet sub-1 and 5 bilateral mid arch bilateral sub-fifth metatarsal on the left and inferior heel bilateral multiple lesions identified and debrided no bleeding noted no signs of infection recommend followup in  3 months for an as-needed basis for future palliative care  Harriet Masson DPM

## 2014-04-07 NOTE — Progress Notes (Signed)
   Subjective:    Patient ID: [name], male    DOB: Aug 22, 1945, 68 y.o.   MRN: [id number]  HPI Pt presents for nail debridement   Review of Systems     Objective:   Physical Exam        Assessment & Plan:

## 2014-04-21 ENCOUNTER — Ambulatory Visit (INDEPENDENT_AMBULATORY_CARE_PROVIDER_SITE_OTHER): Payer: Medicare Other | Admitting: Gastroenterology

## 2014-04-21 ENCOUNTER — Encounter: Payer: Self-pay | Admitting: Gastroenterology

## 2014-04-21 VITALS — BP 126/64 | HR 74 | Ht 74.0 in | Wt 280.0 lb

## 2014-04-21 DIAGNOSIS — R070 Pain in throat: Secondary | ICD-10-CM | POA: Diagnosis not present

## 2014-04-21 DIAGNOSIS — K220 Achalasia of cardia: Secondary | ICD-10-CM | POA: Diagnosis not present

## 2014-04-21 MED ORDER — OMEPRAZOLE 20 MG PO CPDR: 20.0000 mg | DELAYED_RELEASE_CAPSULE | Freq: Every day | ORAL | Status: DC

## 2014-04-21 NOTE — Assessment & Plan Note (Signed)
2 week history of throat discomfort without findings on physical exam.  He does not appear to have an upper respiratory infection though this is possible.  Pain from esophageal reflux is a concern although on not able to elicit this history, per se.  Recommendations #1 trial of omeprazole 20 mg daily.  Patient was instructed to call back in 5-7 days

## 2014-04-21 NOTE — Progress Notes (Signed)
      History of Present Illness:  [name] has returned complaining of throat pain.  For the past 1-2 weeks he's had fairly constant throat discomfort including with swallowing.  He denies ear pain or other URI symptoms.  He also denies dysphagia.  I have difficulty understanding him but, as best as I can tell, he is not having pyrosis.    Review of Systems: Pertinent positive and negative review of systems were noted in the above HPI section. All other review of systems were otherwise negative.    Current Medications, Allergies, Past Medical History, Past Surgical History, Family History and Social History were reviewed in [place] record  Vital signs were reviewed in today's medical record. Physical Exam: General: Well developed , well nourished, no acute distress Skin: anicteric Head: Normocephalic and atraumatic Eyes:  sclerae anicteric, EOMI Ears: Normal auditory acuity Mouth: No deformity or lesions Neck is without lymphadenopathy  See Assessment and Plan under Problem List

## 2014-04-21 NOTE — Patient Instructions (Signed)
We are sending in a prescription of Omeprazole to your pharmacy Call back 5-7 days to report progress on your throat

## 2014-04-21 NOTE — Assessment & Plan Note (Signed)
Last Botox injection August, 2015.  Patient is not complaining of dysphagia.  Will treat expectantly

## 2014-04-23 DIAGNOSIS — H4041X1 Glaucoma secondary to eye inflammation, right eye, mild stage: Secondary | ICD-10-CM | POA: Diagnosis not present

## 2014-04-23 DIAGNOSIS — H209 Unspecified iridocyclitis: Secondary | ICD-10-CM | POA: Diagnosis not present

## 2014-06-05 ENCOUNTER — Ambulatory Visit (INDEPENDENT_AMBULATORY_CARE_PROVIDER_SITE_OTHER): Payer: Medicare Other | Admitting: Internal Medicine

## 2014-06-05 ENCOUNTER — Encounter: Payer: Self-pay | Admitting: Internal Medicine

## 2014-06-05 VITALS — BP 116/60 | HR 61 | Temp 98.2°F | Wt 288.2 lb

## 2014-06-05 DIAGNOSIS — M1711 Unilateral primary osteoarthritis, right knee: Secondary | ICD-10-CM

## 2014-06-05 DIAGNOSIS — E538 Deficiency of other specified B group vitamins: Secondary | ICD-10-CM | POA: Diagnosis not present

## 2014-06-05 DIAGNOSIS — B351 Tinea unguium: Secondary | ICD-10-CM

## 2014-06-05 DIAGNOSIS — K219 Gastro-esophageal reflux disease without esophagitis: Secondary | ICD-10-CM

## 2014-06-05 DIAGNOSIS — M4806 Spinal stenosis, lumbar region: Secondary | ICD-10-CM | POA: Diagnosis not present

## 2014-06-05 DIAGNOSIS — I10 Essential (primary) hypertension: Secondary | ICD-10-CM

## 2014-06-05 DIAGNOSIS — M48062 Spinal stenosis, lumbar region with neurogenic claudication: Secondary | ICD-10-CM

## 2014-06-05 DIAGNOSIS — M1990 Unspecified osteoarthritis, unspecified site: Secondary | ICD-10-CM

## 2014-06-05 HISTORY — DX: Tinea unguium: B35.1

## 2014-06-05 LAB — VITAMIN B12: VITAMIN B 12: 496 pg/mL (ref 211–911)

## 2014-06-05 MED ORDER — OXYCODONE-ACETAMINOPHEN 10-325 MG PO TABS: 1.0000 | ORAL_TABLET | Freq: Four times a day (QID) | ORAL | Status: DC | PRN

## 2014-06-05 NOTE — Patient Instructions (Signed)
It was good to see you again.  I am glad your pain is improved.  1) I checked your vitamin B12 level today.  I will call you next week if the level is abnormal.  2) Keep taking your other medications as you are.  I will see you in 6 months, sooner if necessary.

## 2014-06-05 NOTE — Assessment & Plan Note (Signed)
His osteoarthritic pain did not respond to numerous classes of NSAIDs. It also did not respond to tramadol. It has been well controlled on the oxycodone-acetaminophen 10-325 mg 1 tablet every 6 hours as needed for pain. We will therefore continue the Percocet.

## 2014-06-05 NOTE — Assessment & Plan Note (Signed)
He has no symptoms of heartburn on omeprazole 20 mg by mouth daily. He notes continued mild throat pain only with swallowing but denies any dysphagia. We will continue the omeprazole at 20 mg by mouth daily as it is effective at controlling his gastroesophageal reflux symptoms. Without sedation he is not in need of repeat Botox injections for his achalasia at this time.

## 2014-06-05 NOTE — Assessment & Plan Note (Signed)
His neurogenic claudication is reasonably well-controlled on the gabapentin 600 mg by mouth 3 times daily. This medication will be continued and we will reassess his symptoms at the follow-up visit. We also will make sure that he has adequate repletion of his vitamin B12 levels as we do not want to complicate this neurogenic claudication with a B12 neuropathy.

## 2014-06-05 NOTE — Progress Notes (Signed)
   Subjective:    Patient ID: [name], male    DOB: 12-Mar-1946, 68 y.o.   MRN: [id number]  HPI  Please see the A&P for the status of the pt's chronic medical problems.  Review of Systems  Constitutional: Negative for activity change and appetite change.  HENT: Positive for sore throat. Negative for trouble swallowing.        Mild sore throat not improved with addition of omeprazole.  Eyes: Positive for visual disturbance.  Respiratory: Negative for chest tightness, shortness of breath and wheezing.   Cardiovascular: Negative for chest pain, palpitations and leg swelling.  Gastrointestinal: Negative for nausea, vomiting, abdominal pain, constipation and abdominal distention.  Musculoskeletal: Positive for arthralgias and gait problem. Negative for myalgias, joint swelling, neck pain and neck stiffness.  Skin: Negative for rash and wound.  Neurological: Positive for speech difficulty. Negative for dizziness, syncope, weakness and light-headedness.      Objective:   Physical Exam  Constitutional: He is oriented to person, place, and time. He appears well-developed and well-nourished. No distress.  HENT:  Head: Normocephalic and atraumatic.  Cardiovascular: Normal rate and regular rhythm.  Exam reveals no gallop and no friction rub.   Murmur heard. III/VI systolic crescendo-decrescendo murmur best heard at the LUSB.  Pulmonary/Chest: Effort normal and breath sounds normal. No respiratory distress. He has no wheezes. He has no rales. He exhibits no tenderness.  Abdominal: Soft. Bowel sounds are normal. He exhibits no distension. There is no tenderness. There is no rebound and no guarding.  Musculoskeletal: Normal range of motion. He exhibits no edema or tenderness.  Neurological: He is alert and oriented to person, place, and time. He exhibits normal muscle tone.  Skin: Skin is warm and dry. No rash noted. He is not diaphoretic. No erythema.  Psychiatric: He has a normal mood and  affect. His behavior is normal. Judgment and thought content normal.  Nursing note and vitals reviewed.     Assessment & Plan:   Please see problem oriented charting.

## 2014-06-05 NOTE — Assessment & Plan Note (Signed)
His blood pressure today was 116/60. This is well within the target range. This is on amlodipine 10 mg by mouth daily and lisinopril-hydrochlorothiazide 20-12.5 mg by mouth daily. He astutely he asked if he needed to take 2 blood pressure medications and I offered him the option to stop the amlodipine. As he thought about it he decided to continue both medications since his blood pressure was well controlled and he was tolerating this regimen very well. We will reassess the blood pressure at follow-up visit.

## 2014-06-05 NOTE — Assessment & Plan Note (Signed)
He has restarted his oral vitamin B12 supplementation. A vitamin B-12 level was drawn today and is pending at the time of this dictation.

## 2014-06-06 NOTE — Progress Notes (Signed)
Vitamin B 12 level is 496 while on oral supplementation and this level is increased from the previous measurement.  We will therefore continue the vitamin B12 oral supplementation at the current dose.

## 2014-06-23 DIAGNOSIS — H33021 Retinal detachment with multiple breaks, right eye: Secondary | ICD-10-CM | POA: Diagnosis not present

## 2014-06-23 DIAGNOSIS — H4011X2 Primary open-angle glaucoma, moderate stage: Secondary | ICD-10-CM | POA: Diagnosis not present

## 2014-07-08 ENCOUNTER — Ambulatory Visit (INDEPENDENT_AMBULATORY_CARE_PROVIDER_SITE_OTHER): Payer: Medicare Other | Admitting: Internal Medicine

## 2014-07-08 ENCOUNTER — Encounter: Payer: Self-pay | Admitting: Internal Medicine

## 2014-07-08 VITALS — BP 125/61 | HR 74 | Temp 98.6°F | Wt 286.0 lb

## 2014-07-08 DIAGNOSIS — J069 Acute upper respiratory infection, unspecified: Secondary | ICD-10-CM | POA: Diagnosis not present

## 2014-07-08 MED ORDER — GUAIFENESIN ER 600 MG PO TB12: 600.0000 mg | ORAL_TABLET | Freq: Two times a day (BID) | ORAL | Status: DC

## 2014-07-08 NOTE — Assessment & Plan Note (Signed)
Patient Lungs CTA, no fever, getting better. Suspect he may have picked up a URI and is now recovering. I discussed supportive care and recommended Mucinex to take for his cough. He will call the clinic if he worsens or does not improve in two weeks although I warned him it can sometimes take longer to get back to normal.

## 2014-07-08 NOTE — Patient Instructions (Addendum)
General Instructions: 1. Drink plenty of fluid and rest well. 2. Please take OTC Tylenol for fever and pain, take OTC Mucinex for cough 3. Call the clinic if you have fever, SOB, worsening of cough, sputum or other current symptoms.  4. Follow up in 7-10 days if not better.    Please bring your medicines with you each time you come to clinic.  Medicines may include prescription medications, over-the-counter medications, herbal remedies, eye drops, vitamins, or other pills.   Progress Toward Treatment Goals:  Treatment Goal 06/05/2014  Blood pressure at goal  Prevent falls unchanged    Self Care Goals & Plans:  Self Care Goal 06/05/2014  Manage my medications take my medicines as prescribed; refill my medications on time  Monitor my health -  Eat healthy foods eat foods that are low in salt; eat baked foods instead of fried foods; drink diet soda or water instead of juice or soda  Be physically active find an activity I enjoy  Prevent falls use home fall prevention checklist to improve safety  Meeting treatment goals -    No flowsheet data found.   Care Management & Community Referrals:  Referral 06/05/2014  Referrals made for care management support none needed  Referrals made to community resources none

## 2014-07-08 NOTE — Progress Notes (Addendum)
[facility] Subjective:   Patient ID: [name] male   DOB: 1946-02-11 69 y.o.   MRN: [id number]  HPI: [name] is a 69 y.o. male with a PMH below who presents for an acute visit he reports that that he has felt a little sick on and off for a week or two.  He reports a cough sometimes (non productive), he is occasionally feeling warm and cold.  He also reports he is sneezing. He denies any congestion.  He report chronic body aches but no new myalgias.  No diarrhea constipation, nausea or vomiting.  He does have occasional SOB. He lives in an ALF and he notes some people have been sick, he does have his own room. He reports that he feels his throat is tickled by something in the back of it. He feels that his cough and overall symptoms are much better today than they were when this first started.   Past Medical History  Diagnosis Date  . Essential hypertension 06/15/2006  . 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 Mild aortic valve stenosis 10/09/2006    Echo (12/09/2009): Valve area: 2.03 cm2   . Lumbar stenosis with neurogenic claudication 06/16/2006    Moderate: L2 through L4.  Complicated by chronic low back pain and neuropathy.  Nerve conduction study (10/19/11): Diffusely low motor amplitudes, with primarily involvement of the peroneal and posterior tibial nerves. No evidence of generalized peripheral neuropathy. Findings could be c/w bil multilevel lumbosacral radiculopathies or a primary motor neuropathy.   . Osteoarthritis 11/25/2009    Right knee (medial compartment), right hip, left shoulder   . Obesity, Class I, BMI 30-34.9 04/05/2012  . Gastroesophageal reflux disease 02/04/2007  . B12 deficiency 10/01/2011    Discovered to 2 progressive neurologic pain and dysfunction.  Diagnosis established March 2013.  Requires the following treatment: B12 IM monthly until level normalizes.  After that, oral supplementation.   . 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 Cataract   . Neuropathy   . Diverticulosis 08/26/2013  . Internal hemorrhoids 08/26/2013  . AVM (arteriovenous malformation) of colon 08/26/2013    Cecum X 3, without bleeding on colonoscopy February 2015   . Left posterior subcapsular cataract 10/03/2013    s/p yag laser capsulotomy 10/03/2013   . Family history of malignant neoplasm of gastrointestinal tract 06/30/2013    Father with colon cancer age 27   . Achalasia 05/05/2013    Esophageal manometry demonstrated an elevated resting LES an incomplete relaxation but normal peristalsis.  Excellent symptomatic response to LES Botox injections.   . Chronic venous insufficiency 11/28/2013  . Onychomycosis of toenail 06/05/2014   Current Outpatient Prescriptions  Medication Sig Dispense Refill  . amLODipine (NORVASC) 10 MG tablet Take 10 mg by mouth every morning.    [name] aspirin 81 MG EC tablet Take 81 mg by mouth daily.      [name] gabapentin (NEURONTIN) 300 MG capsule Take 600 mg by mouth 3 (three) times daily.    [name] guaiFENesin (MUCINEX) 600 MG 12 hr tablet Take 1 tablet (600 mg total) by mouth 2 (two) times daily. 60 tablet 0  . latanoprost (XALATAN) 0.005 % ophthalmic solution     . lisinopril-hydrochlorothiazide (PRINZIDE,ZESTORETIC) 20-12.5 MG per tablet Take 2 tablets by mouth every morning.    [name] ofloxacin (OCUFLOX) 0.3 % ophthalmic solution     .  omeprazole (PRILOSEC) 20 MG capsule Take 1 capsule (20 mg total) by mouth daily. 30 capsule 3  . oxyCODONE-acetaminophen (PERCOCET) 10-325 MG per tablet Take 1 tablet by mouth every 6 (six) hours as needed for pain. 90 tablet 0  . prednisoLONE acetate (PRED FORTE) 1 % ophthalmic suspension Place 1 drop into the right eye 4 (four) times daily.     . vitamin B-12 (CYANOCOBALAMIN) 1000 MCG tablet Take 1 tablet (1,000 mcg total) by mouth daily. 90 tablet 3   No current  facility-administered medications for this visit.   Family History  Problem Relation Age of Onset  . Heart disease Mother   . Colon cancer Father 71  . Kidney disease Brother     on dialysis  . Pancreatic cancer Brother   . Hypertension Sister   . Diabetes Sister   . Anesthesia problems Neg Hx   . Kidney disease Brother     on dialysis  . Diabetes Sister    History   Social History  . Marital Status: Single    Spouse Name: N/A    Number of Children: N/A  . Years of Education: N/A   Social History Main Topics  . Smoking status: Former Smoker -- 0.50 packs/day for 30 years    Types: Cigarettes    Quit date: 11/05/2010  . Smokeless tobacco: Never Used  . Alcohol Use: No     Comment: Heavy in the past  . Drug Use: No  . Sexual Activity: No     Comment: quit in 2014   Other Topics Concern  . None   Social History Narrative   Review of Systems: Review of Systems  Constitutional: Positive for chills and malaise/fatigue. Negative for fever and weight loss.  Respiratory: Positive for cough and shortness of breath.   Cardiovascular: Positive for leg swelling (chronic). Negative for chest pain.  Gastrointestinal: Negative for abdominal pain, diarrhea and constipation.  Genitourinary: Negative for dysuria.  Skin: Negative for rash.  Neurological: Negative for dizziness, weakness and headaches.     Objective:  Physical Exam: Filed Vitals:   07/08/14 1321  BP: 125/61  Pulse: 74  Temp: 98.6 F (37 C)  TempSrc: Oral  Weight: 286 lb (129.729 kg)  SpO2: 100%   Physical Exam  Constitutional: He is well-developed, well-nourished, and in no distress.  HENT:  Mouth/Throat: Oropharynx is clear and moist. No oropharyngeal exudate.  Cardiovascular: Normal rate and regular rhythm.   Murmur (3/6 systolic) heard. Pulmonary/Chest: Effort normal and breath sounds normal. No respiratory distress. He has no wheezes. He has no rales.  Abdominal: Soft. Bowel sounds are normal.   Lymphadenopathy:    He has no cervical adenopathy.  Nursing note and vitals reviewed.    Assessment & Plan:  Case discussed with Dr. Dareen Piano  Acute upper respiratory infection Patient Lungs CTA, no fever, getting better. Suspect he may have picked up a URI and is now recovering. I discussed supportive care and recommended Mucinex to take for his cough. He will call the clinic if he worsens or does not improve in two weeks although I warned him it can sometimes take longer to get back to normal.     Medications Ordered Meds ordered this encounter  Medications  . guaiFENesin (MUCINEX) 600 MG 12 hr tablet    Sig: Take 1 tablet (600 mg total) by mouth 2 (two) times daily.    Dispense:  60 tablet    Refill:  0   Other Orders No orders of  the defined types were placed in this encounter.

## 2014-07-09 NOTE — Progress Notes (Signed)
INTERNAL MEDICINE TEACHING ATTENDING ADDENDUM - Keyauna Graefe, MD: I reviewed and discussed at the time of visit with the resident Dr. Hoffman, the patient's medical history, physical examination, diagnosis and results of pertinent tests and treatment and I agree with the patient's care as documented.  

## 2014-07-14 ENCOUNTER — Other Ambulatory Visit: Payer: Self-pay | Admitting: Internal Medicine

## 2014-07-14 DIAGNOSIS — I10 Essential (primary) hypertension: Secondary | ICD-10-CM

## 2014-07-14 DIAGNOSIS — M48062 Spinal stenosis, lumbar region with neurogenic claudication: Secondary | ICD-10-CM

## 2014-07-16 ENCOUNTER — Other Ambulatory Visit: Payer: Self-pay | Admitting: Gastroenterology

## 2014-08-14 DIAGNOSIS — M1711 Unilateral primary osteoarthritis, right knee: Secondary | ICD-10-CM | POA: Diagnosis not present

## 2014-08-14 DIAGNOSIS — Z9181 History of falling: Secondary | ICD-10-CM | POA: Diagnosis not present

## 2014-08-14 DIAGNOSIS — R269 Unspecified abnormalities of gait and mobility: Secondary | ICD-10-CM | POA: Diagnosis not present

## 2014-08-14 DIAGNOSIS — M4806 Spinal stenosis, lumbar region: Secondary | ICD-10-CM | POA: Diagnosis not present

## 2014-08-14 DIAGNOSIS — I10 Essential (primary) hypertension: Secondary | ICD-10-CM | POA: Diagnosis not present

## 2014-08-20 DIAGNOSIS — Z9181 History of falling: Secondary | ICD-10-CM | POA: Diagnosis not present

## 2014-08-20 DIAGNOSIS — I10 Essential (primary) hypertension: Secondary | ICD-10-CM | POA: Diagnosis not present

## 2014-08-20 DIAGNOSIS — M1711 Unilateral primary osteoarthritis, right knee: Secondary | ICD-10-CM | POA: Diagnosis not present

## 2014-08-20 DIAGNOSIS — R269 Unspecified abnormalities of gait and mobility: Secondary | ICD-10-CM | POA: Diagnosis not present

## 2014-08-20 DIAGNOSIS — M4806 Spinal stenosis, lumbar region: Secondary | ICD-10-CM | POA: Diagnosis not present

## 2014-08-21 DIAGNOSIS — M1711 Unilateral primary osteoarthritis, right knee: Secondary | ICD-10-CM | POA: Diagnosis not present

## 2014-08-21 DIAGNOSIS — R269 Unspecified abnormalities of gait and mobility: Secondary | ICD-10-CM | POA: Diagnosis not present

## 2014-08-21 DIAGNOSIS — I10 Essential (primary) hypertension: Secondary | ICD-10-CM | POA: Diagnosis not present

## 2014-08-21 DIAGNOSIS — Z9181 History of falling: Secondary | ICD-10-CM | POA: Diagnosis not present

## 2014-08-21 DIAGNOSIS — M4806 Spinal stenosis, lumbar region: Secondary | ICD-10-CM | POA: Diagnosis not present

## 2014-08-25 DIAGNOSIS — R269 Unspecified abnormalities of gait and mobility: Secondary | ICD-10-CM | POA: Diagnosis not present

## 2014-08-25 DIAGNOSIS — I10 Essential (primary) hypertension: Secondary | ICD-10-CM | POA: Diagnosis not present

## 2014-08-25 DIAGNOSIS — Z9181 History of falling: Secondary | ICD-10-CM | POA: Diagnosis not present

## 2014-08-25 DIAGNOSIS — M1711 Unilateral primary osteoarthritis, right knee: Secondary | ICD-10-CM | POA: Diagnosis not present

## 2014-08-25 DIAGNOSIS — M4806 Spinal stenosis, lumbar region: Secondary | ICD-10-CM | POA: Diagnosis not present

## 2014-08-28 DIAGNOSIS — Z9181 History of falling: Secondary | ICD-10-CM | POA: Diagnosis not present

## 2014-08-28 DIAGNOSIS — R269 Unspecified abnormalities of gait and mobility: Secondary | ICD-10-CM | POA: Diagnosis not present

## 2014-08-28 DIAGNOSIS — I10 Essential (primary) hypertension: Secondary | ICD-10-CM | POA: Diagnosis not present

## 2014-08-28 DIAGNOSIS — M4806 Spinal stenosis, lumbar region: Secondary | ICD-10-CM | POA: Diagnosis not present

## 2014-08-28 DIAGNOSIS — M1711 Unilateral primary osteoarthritis, right knee: Secondary | ICD-10-CM | POA: Diagnosis not present

## 2014-08-31 ENCOUNTER — Encounter: Payer: Self-pay | Admitting: Internal Medicine

## 2014-08-31 ENCOUNTER — Ambulatory Visit (INDEPENDENT_AMBULATORY_CARE_PROVIDER_SITE_OTHER): Payer: Medicare Other | Admitting: Internal Medicine

## 2014-08-31 VITALS — BP 129/61 | HR 72 | Temp 98.2°F | Wt 287.4 lb

## 2014-08-31 DIAGNOSIS — K220 Achalasia of cardia: Secondary | ICD-10-CM

## 2014-08-31 DIAGNOSIS — M1711 Unilateral primary osteoarthritis, right knee: Secondary | ICD-10-CM

## 2014-08-31 DIAGNOSIS — I10 Essential (primary) hypertension: Secondary | ICD-10-CM

## 2014-08-31 DIAGNOSIS — E669 Obesity, unspecified: Secondary | ICD-10-CM | POA: Diagnosis not present

## 2014-08-31 DIAGNOSIS — Z23 Encounter for immunization: Secondary | ICD-10-CM

## 2014-08-31 DIAGNOSIS — K219 Gastro-esophageal reflux disease without esophagitis: Secondary | ICD-10-CM | POA: Diagnosis not present

## 2014-08-31 DIAGNOSIS — I872 Venous insufficiency (chronic) (peripheral): Secondary | ICD-10-CM

## 2014-08-31 MED ORDER — OXYCODONE-ACETAMINOPHEN 10-325 MG PO TABS: 1.0000 | ORAL_TABLET | Freq: Four times a day (QID) | ORAL | Status: DC | PRN

## 2014-08-31 MED ORDER — OMEPRAZOLE 20 MG PO CPDR: 20.0000 mg | DELAYED_RELEASE_CAPSULE | Freq: Two times a day (BID) | ORAL | Status: DC

## 2014-08-31 NOTE — Assessment & Plan Note (Signed)
He has not had any symptoms consistent with his prior achalasia stating his food has no difficulty getting to his stomach.

## 2014-08-31 NOTE — Assessment & Plan Note (Signed)
His weight is up 1 pound from the last visit 2 months ago. He was encouraged to try to be more active as the weather has begun to improve now that we are starting Spring. He states it's difficult to do so with his claudication and arthritis but we'll try to to pay more attention to his activity as well as his diet.

## 2014-08-31 NOTE — Assessment & Plan Note (Signed)
He is satisfied with his Percocet 10-325 mg by mouth every 6 hours as needed for pain as this is effective in controlling his discomfort. We will reassess the continued control of his symptoms on this regimen at the follow-up visit.

## 2014-08-31 NOTE — Assessment & Plan Note (Signed)
He continues to note some mild swelling in both of his legs which decrease overnight and then worsened during the day and is consistent with his chronic venous insufficiency. He states that when his aide is available he will wear the compression stockings, but he cannot get them on himself. Looking at the legs he has no evidence of chronic venous insufficiency associated ulcerations and I assured him not to worry about mild lower extremity edema as long as there were no breaks in his skin. We will reassess his edema in the lower extremities at the follow-up visit.

## 2014-08-31 NOTE — Progress Notes (Signed)
   Subjective:    Patient ID: [name], male    DOB: February 14, 1946, 69 y.o.   MRN: [id number]  HPI  Please see the A&P for the status of the pt's chronic medical problems.  Review of Systems  Constitutional: Negative for activity change, appetite change and unexpected weight change.  Respiratory: Negative for cough, choking, chest tightness and shortness of breath.   Cardiovascular: Positive for chest pain and leg swelling. Negative for palpitations.  Gastrointestinal: Positive for abdominal pain. Negative for nausea, vomiting, diarrhea and constipation.       Acid reflux on occasion which responds to an antiacid or an extra omeprazole.  Musculoskeletal: Positive for myalgias, arthralgias and gait problem. Negative for back pain, joint swelling, neck pain and neck stiffness.  Skin: Negative for rash and wound.  Psychiatric/Behavioral: Negative for dysphoric mood.      Objective:   Physical Exam  Constitutional: He is oriented to person, place, and time. He appears well-developed and well-nourished. No distress.  HENT:  Head: Normocephalic and atraumatic.  Eyes: Conjunctivae are normal. Right eye exhibits no discharge. Left eye exhibits no discharge. No scleral icterus.  Cardiovascular: Normal rate and regular rhythm.  Exam reveals no gallop and no friction rub.   Murmur heard. III/VI crescendo-decrescendo murmur best heard in the bilateral upper sternal boarders.  Pulmonary/Chest: Effort normal and breath sounds normal. No respiratory distress. He has no wheezes. He has no rales.  Abdominal: Soft. Bowel sounds are normal. He exhibits no distension. There is no tenderness. There is no rebound and no guarding.  Musculoskeletal: Normal range of motion. He exhibits edema. He exhibits no tenderness.  Neurological: He is alert and oriented to person, place, and time. He exhibits normal muscle tone.  Skin: Skin is warm and dry. He is not diaphoretic.  Xerosis of bilateral lower  extremities  Psychiatric: He has a normal mood and affect. His behavior is normal. Judgment and thought content normal.  Nursing note and vitals reviewed.     Assessment & Plan:   Please see problem oriented charting.

## 2014-08-31 NOTE — Assessment & Plan Note (Signed)
He notes occasional heartburn which is quickly relieved with either an antacid or a second omeprazole. This does not occur every night but is an issue that he finds distressing. He denies associated shortness of breath, diaphoresis, nausea, or dizziness. We decided to increase the omeprazole to 20 mg by mouth twice daily in order to decrease the frequency of these events. I also advised him to buy some antacids such as Tums and carry a few tablets in his pocket as his medication is frequently on the second floor and it is difficult for him to get there given his gait issues when he is on the first floor. We will reassess the symptomatic control with these changes at the follow-up visit.

## 2014-08-31 NOTE — Patient Instructions (Signed)
It was good to see you again.  You are doing a nice job with your health!  1) Increase the omeprazole to 20 mg twice a day for your pain.  2) Carry calcium carbonate (TUMS antiacid) in your pocket for those episodes of heartburn that you will occasionally get.  This should give you faster relief.  3) We gave you the pneumovax 13 vaccination today.  I will see you in 6 months, sooner if necessary.

## 2014-08-31 NOTE — Assessment & Plan Note (Signed)
He received the Pneumovax 13 vaccination today. We will discuss the Zostavax at the follow-up visit. He is otherwise up-to-date on his health care maintenance.

## 2014-08-31 NOTE — Assessment & Plan Note (Signed)
His blood pressure today is 129/61 which is well within target. This is on amlodipine 10 mg by mouth daily and lisinopril-hydrochlorothiazide 20-12.5 mg daily. We will continue with this regimen and recheck his blood pressure at the follow-up visit.

## 2014-09-01 DIAGNOSIS — Z9181 History of falling: Secondary | ICD-10-CM | POA: Diagnosis not present

## 2014-09-01 DIAGNOSIS — I10 Essential (primary) hypertension: Secondary | ICD-10-CM | POA: Diagnosis not present

## 2014-09-01 DIAGNOSIS — M4806 Spinal stenosis, lumbar region: Secondary | ICD-10-CM | POA: Diagnosis not present

## 2014-09-01 DIAGNOSIS — M1711 Unilateral primary osteoarthritis, right knee: Secondary | ICD-10-CM | POA: Diagnosis not present

## 2014-09-01 DIAGNOSIS — R269 Unspecified abnormalities of gait and mobility: Secondary | ICD-10-CM | POA: Diagnosis not present

## 2014-09-03 DIAGNOSIS — Z9181 History of falling: Secondary | ICD-10-CM | POA: Diagnosis not present

## 2014-09-03 DIAGNOSIS — R269 Unspecified abnormalities of gait and mobility: Secondary | ICD-10-CM | POA: Diagnosis not present

## 2014-09-03 DIAGNOSIS — M1711 Unilateral primary osteoarthritis, right knee: Secondary | ICD-10-CM | POA: Diagnosis not present

## 2014-09-03 DIAGNOSIS — I10 Essential (primary) hypertension: Secondary | ICD-10-CM | POA: Diagnosis not present

## 2014-09-03 DIAGNOSIS — M4806 Spinal stenosis, lumbar region: Secondary | ICD-10-CM | POA: Diagnosis not present

## 2014-09-08 DIAGNOSIS — M1711 Unilateral primary osteoarthritis, right knee: Secondary | ICD-10-CM | POA: Diagnosis not present

## 2014-09-08 DIAGNOSIS — R269 Unspecified abnormalities of gait and mobility: Secondary | ICD-10-CM | POA: Diagnosis not present

## 2014-09-08 DIAGNOSIS — M4806 Spinal stenosis, lumbar region: Secondary | ICD-10-CM | POA: Diagnosis not present

## 2014-09-08 DIAGNOSIS — I10 Essential (primary) hypertension: Secondary | ICD-10-CM | POA: Diagnosis not present

## 2014-09-08 DIAGNOSIS — Z9181 History of falling: Secondary | ICD-10-CM | POA: Diagnosis not present

## 2014-09-09 DIAGNOSIS — Z9181 History of falling: Secondary | ICD-10-CM | POA: Diagnosis not present

## 2014-09-09 DIAGNOSIS — I10 Essential (primary) hypertension: Secondary | ICD-10-CM | POA: Diagnosis not present

## 2014-09-09 DIAGNOSIS — M1711 Unilateral primary osteoarthritis, right knee: Secondary | ICD-10-CM | POA: Diagnosis not present

## 2014-09-09 DIAGNOSIS — R269 Unspecified abnormalities of gait and mobility: Secondary | ICD-10-CM | POA: Diagnosis not present

## 2014-09-09 DIAGNOSIS — M4806 Spinal stenosis, lumbar region: Secondary | ICD-10-CM | POA: Diagnosis not present

## 2014-09-15 DIAGNOSIS — M4806 Spinal stenosis, lumbar region: Secondary | ICD-10-CM | POA: Diagnosis not present

## 2014-09-15 DIAGNOSIS — R269 Unspecified abnormalities of gait and mobility: Secondary | ICD-10-CM | POA: Diagnosis not present

## 2014-09-15 DIAGNOSIS — I10 Essential (primary) hypertension: Secondary | ICD-10-CM | POA: Diagnosis not present

## 2014-09-15 DIAGNOSIS — Z9181 History of falling: Secondary | ICD-10-CM | POA: Diagnosis not present

## 2014-09-15 DIAGNOSIS — M1711 Unilateral primary osteoarthritis, right knee: Secondary | ICD-10-CM | POA: Diagnosis not present

## 2014-09-17 DIAGNOSIS — Z9181 History of falling: Secondary | ICD-10-CM | POA: Diagnosis not present

## 2014-09-17 DIAGNOSIS — M1711 Unilateral primary osteoarthritis, right knee: Secondary | ICD-10-CM | POA: Diagnosis not present

## 2014-09-17 DIAGNOSIS — M4806 Spinal stenosis, lumbar region: Secondary | ICD-10-CM | POA: Diagnosis not present

## 2014-09-17 DIAGNOSIS — R269 Unspecified abnormalities of gait and mobility: Secondary | ICD-10-CM | POA: Diagnosis not present

## 2014-09-17 DIAGNOSIS — I10 Essential (primary) hypertension: Secondary | ICD-10-CM | POA: Diagnosis not present

## 2014-09-21 ENCOUNTER — Other Ambulatory Visit: Payer: Self-pay | Admitting: Internal Medicine

## 2014-09-22 DIAGNOSIS — M1711 Unilateral primary osteoarthritis, right knee: Secondary | ICD-10-CM | POA: Diagnosis not present

## 2014-09-22 DIAGNOSIS — I10 Essential (primary) hypertension: Secondary | ICD-10-CM | POA: Diagnosis not present

## 2014-09-22 DIAGNOSIS — R269 Unspecified abnormalities of gait and mobility: Secondary | ICD-10-CM | POA: Diagnosis not present

## 2014-09-22 DIAGNOSIS — M4806 Spinal stenosis, lumbar region: Secondary | ICD-10-CM | POA: Diagnosis not present

## 2014-09-22 DIAGNOSIS — H33021 Retinal detachment with multiple breaks, right eye: Secondary | ICD-10-CM | POA: Diagnosis not present

## 2014-09-22 DIAGNOSIS — Z9181 History of falling: Secondary | ICD-10-CM | POA: Diagnosis not present

## 2014-09-22 DIAGNOSIS — H4011X2 Primary open-angle glaucoma, moderate stage: Secondary | ICD-10-CM | POA: Diagnosis not present

## 2014-09-24 DIAGNOSIS — R269 Unspecified abnormalities of gait and mobility: Secondary | ICD-10-CM | POA: Diagnosis not present

## 2014-09-24 DIAGNOSIS — M1711 Unilateral primary osteoarthritis, right knee: Secondary | ICD-10-CM | POA: Diagnosis not present

## 2014-09-24 DIAGNOSIS — Z9181 History of falling: Secondary | ICD-10-CM | POA: Diagnosis not present

## 2014-09-24 DIAGNOSIS — I10 Essential (primary) hypertension: Secondary | ICD-10-CM | POA: Diagnosis not present

## 2014-09-24 DIAGNOSIS — M4806 Spinal stenosis, lumbar region: Secondary | ICD-10-CM | POA: Diagnosis not present

## 2014-09-27 IMAGING — RF DG ESOPHAGUS
14 of 20 series · 14 of 24 positions shown · non-contrast
Comparison: Chest radiograph 06/14/2012, CT 12/17/2007

FLUOROSCOPY TIME:  1 min 34 seconds

CLINICAL DATA: Difficulty swallowing foods. Feeling of food gets
stuck in the throat

EXAM:
ESOPHOGRAM/BARIUM SWALLOW
TECHNIQUE: Single contrast examination was performed using  thin barium.

[Series 1: run · 1 of 3 slices shown (1 of 14)]
[im 1/3]
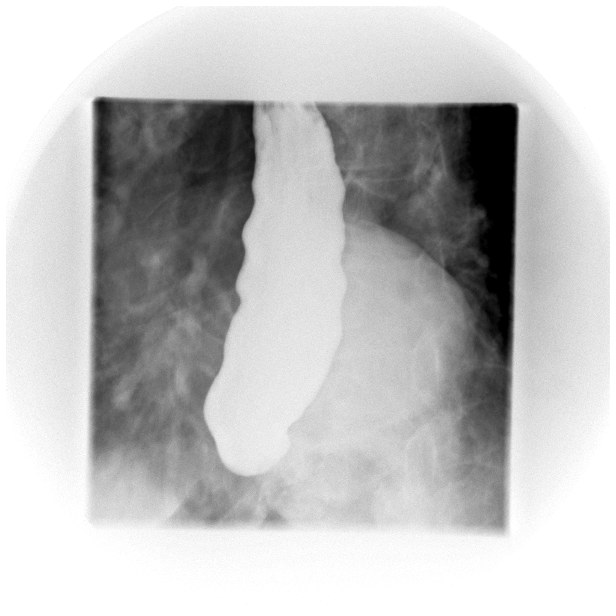

[Series 3: run · 1 of 2 slices shown (2 of 14)]
[im 1/2]
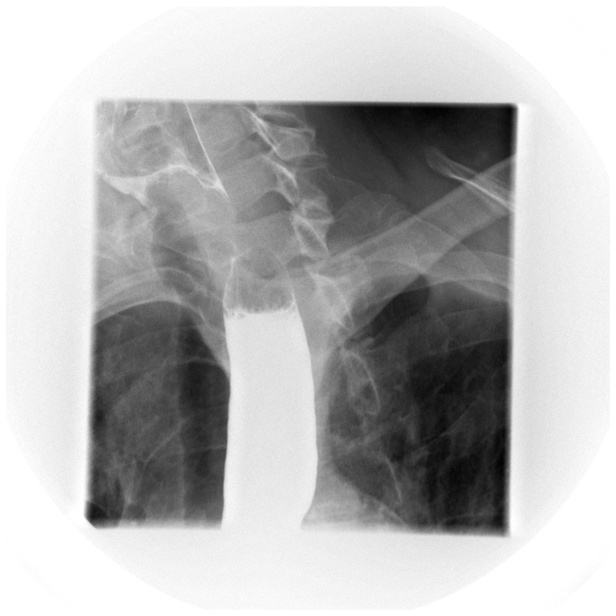

[Series 5: run · 1 of 2 slices shown (3 of 14)]
[im 1/2]
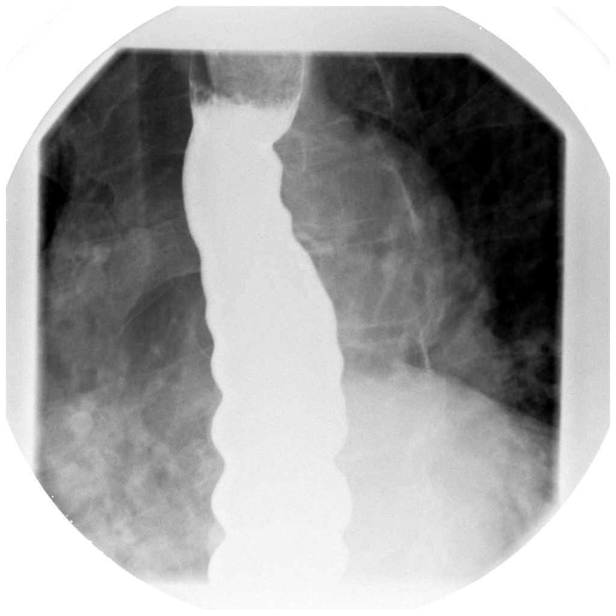

[Series 6: run · 1 of 2 slices shown (4 of 14)]
[im 2/2]
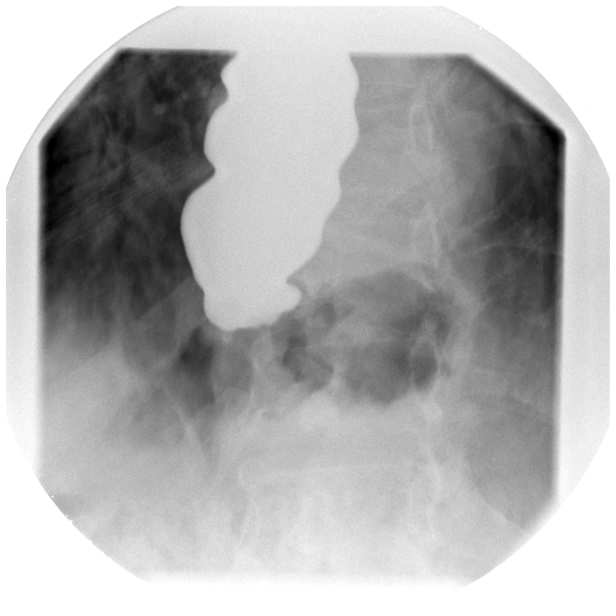

[Series 7: run · 1 of 2 slices shown (5 of 14)]
[im 1/2]
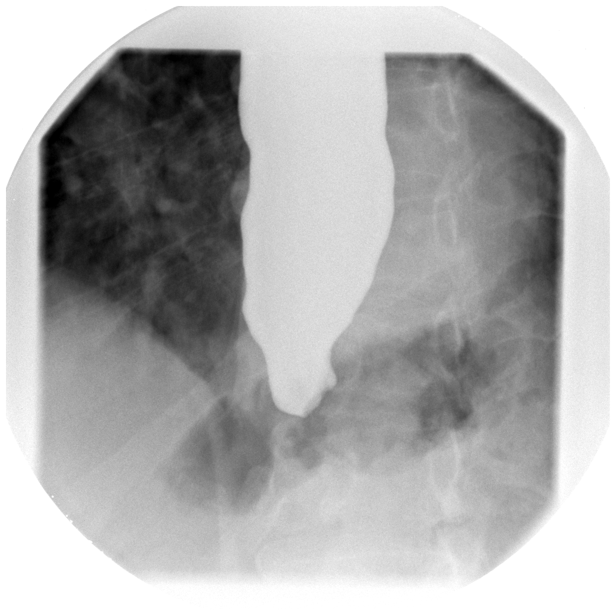

[Series 8: run · 1 of 2 slices shown (6 of 14)]
[im 1/2]
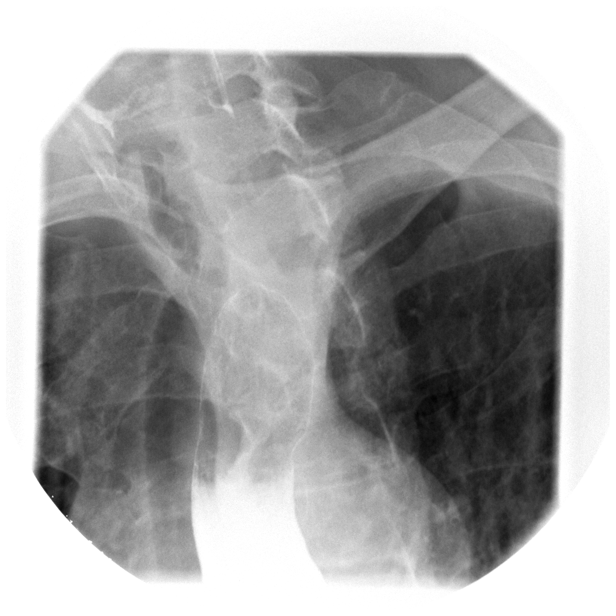

[Series 9: run · 1 of 1 slices shown (7 of 14)]
[im 1/1]
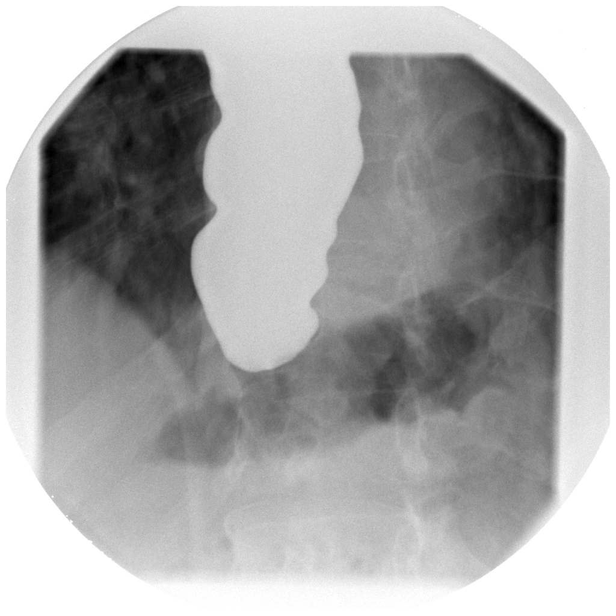

[Series 10: run · 1 of 1 slices shown (8 of 14)]
[im 1/1]
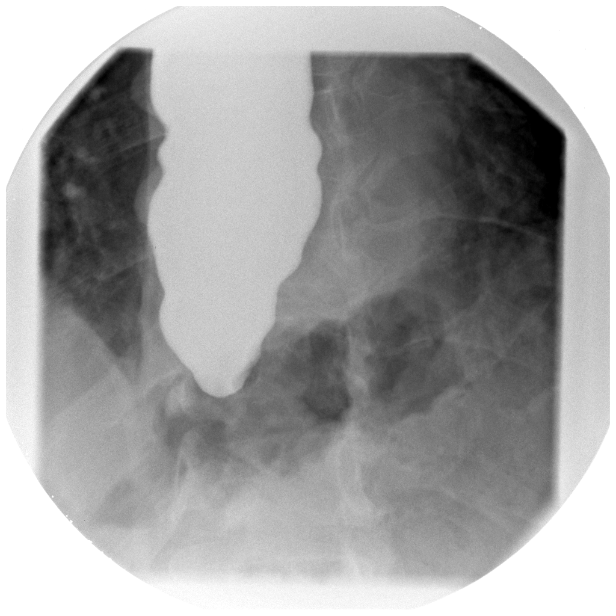

[Series 12: run · 1 of 2 slices shown (9 of 14)]
[im 1/2]
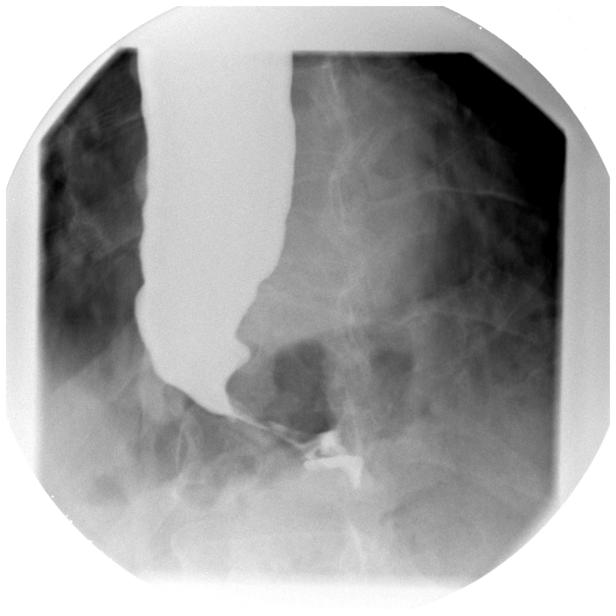

[Series 13: run · 1 of 1 slices shown (10 of 14)]
[im 1/1]
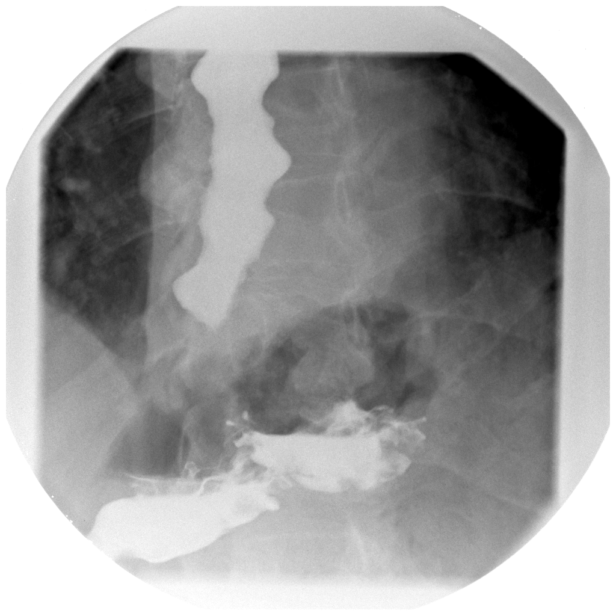

[Series 15: run · 1 of 1 slices shown (11 of 14)]
[im 1/1]
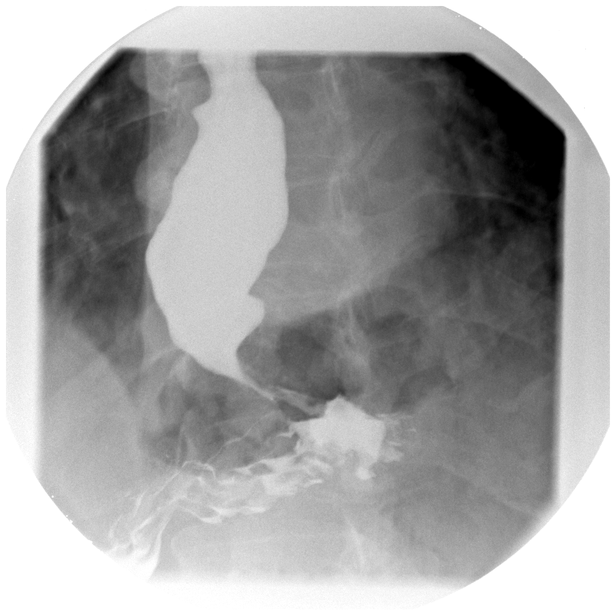

[Series 16: run · 1 of 1 slices shown (12 of 14)]
[im 1/1]
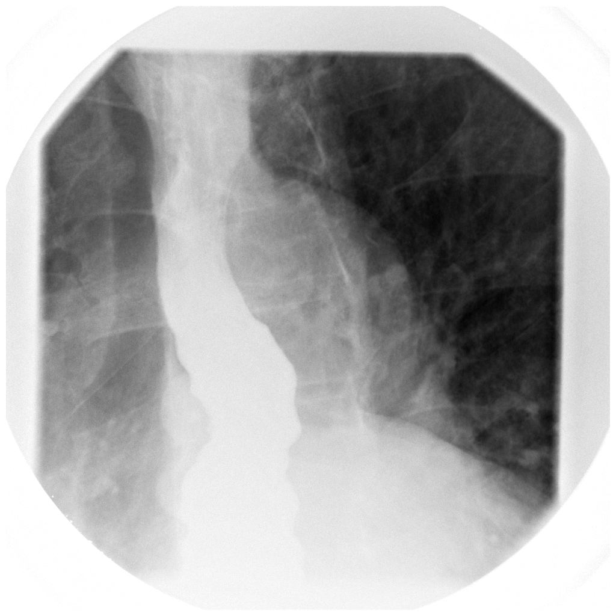

[Series 18: run · 1 of 1 slices shown (13 of 14)]
[im 1/1]
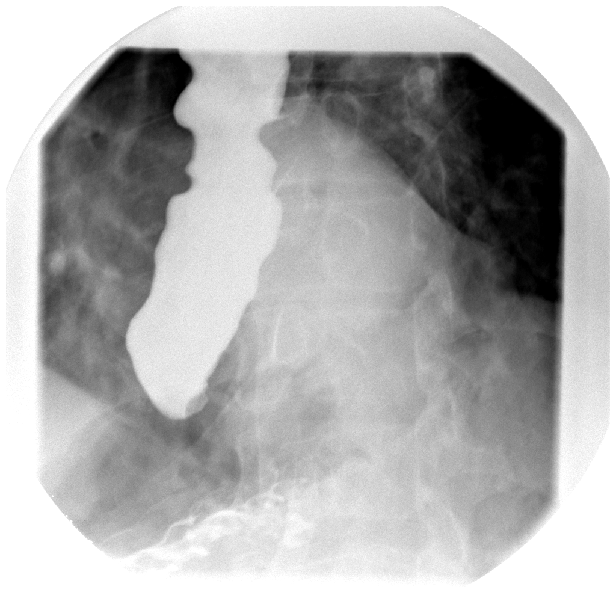

[Series 20: run · 1 of 1 slices shown (14 of 14)]
[im 1/1]
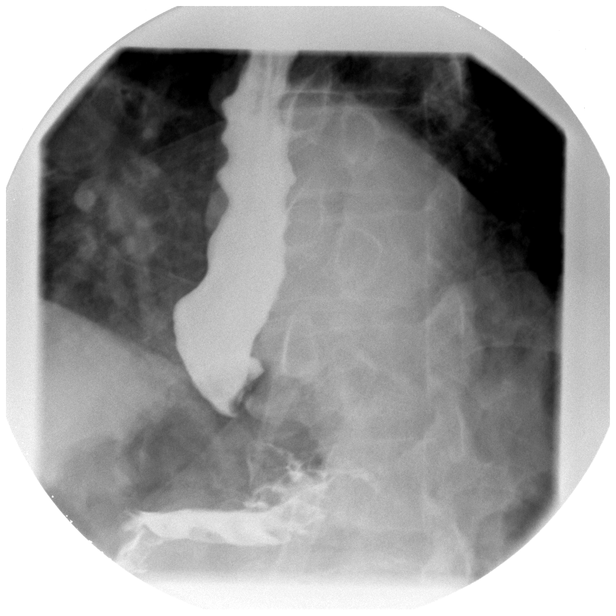

[14 of 24 positions shown; findings below may reference images not displayed]

FINDINGS: The esophagus is patulous and there is to and fro motion of the
barium within the esophagus. The barium refluxes high into the
cervical esophagus . The esophagus is again patulous with some
tertiary contractions. Contrast fail to pass through the the distal
esophagus initially. Ultimately a beak like narrowing of the distal
esophagus allows thin stream of contrast to the GE junction into the
stomach.
IMPRESSION: Beak like narrowing of the distal esophagus which transiently
obstructs the distal esophagus. This coupled with the patulous
esophagus and tertiary contraction is suggestive achalasia. Distal
esophageal spasm and neoplasm are secondary less likely
differentials.

Findings conveyed Angela May Dasig on 05/02/2013 at[DATE].

## 2014-09-28 ENCOUNTER — Ambulatory Visit (INDEPENDENT_AMBULATORY_CARE_PROVIDER_SITE_OTHER): Payer: Medicare Other | Admitting: Internal Medicine

## 2014-09-28 ENCOUNTER — Encounter: Payer: Self-pay | Admitting: Internal Medicine

## 2014-09-28 VITALS — BP 137/71 | HR 79 | Temp 98.4°F | Ht 74.0 in | Wt 282.1 lb

## 2014-09-28 DIAGNOSIS — M1711 Unilateral primary osteoarthritis, right knee: Secondary | ICD-10-CM | POA: Diagnosis present

## 2014-09-28 MED ORDER — MELOXICAM 7.5 MG PO TABS: 7.5000 mg | ORAL_TABLET | Freq: Every day | ORAL | Status: DC

## 2014-09-28 NOTE — Progress Notes (Signed)
[name] Subjective:   Patient ID: [name] male   DOB: [birth year] 69 y.o.   MRN: [id number]  HPI: [name] is a 69 y.o. male with a PMH detailed below who presents for an acute visit for his osteoarthritis of his right knee.  He reports his knee pain is currently a 7/10. Review of his chart shows that he has previously failed multiple NSAIDs and is now on percocet.  He reports that he takes about 2 percoet (he calls them "strong medicine") on average a day and that works well but he is also occasionally using OTC Ibuprofen for milder to moderate pain.  He remembers that he use to have 800mg  strength Ibuprofen and is wondering if this would be helpful or if there was another drug he can use.   Past Medical History  Diagnosis Date  . Essential hypertension 06/15/2006  . 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 Mild aortic valve stenosis 10/09/2006    Echo (12/09/2009): Valve area: 2.03 cm2   . Lumbar stenosis with neurogenic claudication 06/16/2006    Moderate: L2 through L4.  Complicated by chronic low back pain and neuropathy.  Nerve conduction study (10/19/11): Diffusely low motor amplitudes, with primarily involvement of the peroneal and posterior tibial nerves. No evidence of generalized peripheral neuropathy. Findings could be c/w bil multilevel lumbosacral radiculopathies or a primary motor neuropathy.   . Osteoarthritis 11/25/2009    Right knee (medial compartment), right hip, left shoulder   . Obesity, Class I, BMI 30-34.9 04/05/2012  . Gastroesophageal reflux disease 02/04/2007  . B12 deficiency 10/01/2011    Discovered to 2 progressive neurologic pain and dysfunction.  Diagnosis established March 2013.  Requires the following treatment: B12 IM monthly until level normalizes. After that, oral supplementation.   . Rhegmatogenous retinal detachment of right eye 03/18/2013    s/p  surgical repair 03/15/2013   . Cataract   . Neuropathy   . Diverticulosis 08/26/2013  . Internal hemorrhoids 08/26/2013  . AVM (arteriovenous malformation) of colon 08/26/2013    Cecum X 3, without bleeding on colonoscopy February 2015   . Left posterior subcapsular cataract 10/03/2013    s/p yag laser capsulotomy 10/03/2013   . Family history of malignant neoplasm of gastrointestinal tract 06/30/2013    Father with colon cancer age 61   . Achalasia 05/05/2013    Esophageal manometry demonstrated an elevated resting LES an incomplete relaxation but normal peristalsis.  Excellent symptomatic response to LES Botox injections.   . Chronic venous insufficiency 11/28/2013  . Onychomycosis of toenail 06/05/2014   Current Outpatient Prescriptions  Medication Sig Dispense Refill  . amLODipine (NORVASC) 10 MG tablet Take 1 tablet (10 mg total) by mouth daily. 90 tablet 3  . aspirin 81 MG EC tablet Take 81 mg by mouth daily.      [name] gabapentin (NEURONTIN) 300 MG capsule Take 2 capsules (600 mg total) by mouth 3 (three) times daily. 540 capsule 3  . latanoprost (XALATAN) 0.005 % ophthalmic solution     . lisinopril-hydrochlorothiazide (PRINZIDE,ZESTORETIC) 20-12.5 MG per tablet Take 2 tablets by mouth daily. 180 tablet 3  . meloxicam (MOBIC) 7.5 MG tablet Take 1 tablet (7.5 mg total) by mouth daily. 30 tablet 0  . ofloxacin (OCUFLOX) 0.3 % ophthalmic solution     . omeprazole (PRILOSEC) 20 MG capsule Take 1 capsule (20 mg total) by mouth 2 (two) times daily  before a meal. 180 capsule 3  . oxyCODONE-acetaminophen (PERCOCET) 10-325 MG per tablet Take 1 tablet by mouth every 6 (six) hours as needed for pain. 90 tablet 0  . prednisoLONE acetate (PRED FORTE) 1 % ophthalmic suspension Place 1 drop into the right eye 4 (four) times daily.     . vitamin B-12 (CYANOCOBALAMIN) 1000 MCG tablet Take 1 tablet (1,000 mcg total) by mouth daily. 90 tablet 3   No current facility-administered medications for this visit.    Family History  Problem Relation Age of Onset  . Heart disease Mother   . Colon cancer Father 63  . Kidney disease Brother     on dialysis  . Pancreatic cancer Brother   . Hypertension Sister   . Diabetes Sister   . Anesthesia problems Neg Hx   . Kidney disease Brother     on dialysis  . Diabetes Sister    History   Social History  . Marital Status: Single    Spouse Name: N/A  . Number of Children: N/A  . Years of Education: N/A   Social History Main Topics  . Smoking status: Former Smoker -- 0.50 packs/day for 30 years    Types: Cigarettes    Quit date: 11/05/2010  . Smokeless tobacco: Never Used  . Alcohol Use: No     Comment: Heavy in the past  . Drug Use: No  . Sexual Activity: No     Comment: quit in 2014   Other Topics Concern  . None   Social History Narrative   Review of Systems: Review of Systems  Constitutional: Negative for fever and malaise/fatigue.  Respiratory: Negative for cough.   Gastrointestinal: Negative for heartburn (better controlled).  Musculoskeletal: Positive for joint pain. Negative for back pain and falls.  Neurological: Negative for headaches.     Objective:  Physical Exam: Filed Vitals:   09/28/14 0834  BP: 137/71  Pulse: 79  Temp: 98.4 F (36.9 C)  TempSrc: Oral  Height: 6\' 2"  (1.88 m)  Weight: 282 lb 1.6 oz (127.96 kg)  SpO2: 100%   Physical Exam  Constitutional: He is well-developed, well-nourished, and in no distress.  Cardiovascular: Normal rate and regular rhythm.   Murmur (3/6 systolic) heard. Pulmonary/Chest: Effort normal and breath sounds normal.  Musculoskeletal:  Right knee with mild warmth, moderate effusion and tenderness along medial aspect of tibial plateau.  Nursing note and vitals reviewed.    Assessment & Plan:  Case discussed with Dr. Daryll Drown  Osteoarthritis - Patient with known moderate to severe OA of right knee.   - Will have him continue his Percocet, will do a trial of Mobic to see if he  gets some relief from this. - We did discuss the possibility of a joint injection, he last had one about a year ago and was not all that effective and he has some aversion to needles.  He would like to hold off and see if the pill works first. - Follow up in 2-3 weeks if pain not controlled.     Medications Ordered Meds ordered this encounter  Medications  . meloxicam (MOBIC) 7.5 MG tablet    Sig: Take 1 tablet (7.5 mg total) by mouth daily.    Dispense:  30 tablet    Refill:  0   Other Orders No orders of the defined types were placed in this encounter.

## 2014-09-28 NOTE — Assessment & Plan Note (Signed)
-   Patient with known moderate to severe OA of right knee.   - Will have him continue his Percocet, will do a trial of Mobic to see if he gets some relief from this. - We did discuss the possibility of a joint injection, he last had one about a year ago and was not all that effective and he has some aversion to needles.  He would like to hold off and see if the pill works first. - Follow up in 2-3 weeks if pain not controlled.

## 2014-09-28 NOTE — Patient Instructions (Signed)
General Instructions: I want you to take Mobic 7.5mg  once a day for 2 weeks and then return to clinic if not better.  Do not take OTC Ibuprofen  Please bring your medicines with you each time you come to clinic.  Medicines may include prescription medications, over-the-counter medications, herbal remedies, eye drops, vitamins, or other pills.   Progress Toward Treatment Goals:  Treatment Goal 08/31/2014  Blood pressure at goal  Prevent falls -    Self Care Goals & Plans:  Self Care Goal 06/05/2014  Manage my medications take my medicines as prescribed; refill my medications on time  Monitor my health -  Eat healthy foods eat foods that are low in salt; eat baked foods instead of fried foods; drink diet soda or water instead of juice or soda  Be physically active find an activity I enjoy  Prevent falls use home fall prevention checklist to improve safety  Meeting treatment goals -    No flowsheet data found.   Care Management & Community Referrals:  Referral 08/31/2014  Referrals made for care management support none needed  Referrals made to community resources none

## 2014-09-29 DIAGNOSIS — R269 Unspecified abnormalities of gait and mobility: Secondary | ICD-10-CM | POA: Diagnosis not present

## 2014-09-29 DIAGNOSIS — Z9181 History of falling: Secondary | ICD-10-CM | POA: Diagnosis not present

## 2014-09-29 DIAGNOSIS — M4806 Spinal stenosis, lumbar region: Secondary | ICD-10-CM | POA: Diagnosis not present

## 2014-09-29 DIAGNOSIS — M1711 Unilateral primary osteoarthritis, right knee: Secondary | ICD-10-CM | POA: Diagnosis not present

## 2014-09-29 DIAGNOSIS — I10 Essential (primary) hypertension: Secondary | ICD-10-CM | POA: Diagnosis not present

## 2014-09-30 NOTE — Progress Notes (Signed)
Internal Medicine Clinic Attending  Case discussed with Dr. Hoffman soon after the resident saw the patient.  We reviewed the resident's history and exam and pertinent patient test results.  I agree with the assessment, diagnosis, and plan of care documented in the resident's note. 

## 2014-09-30 NOTE — Addendum Note (Signed)
Addended by: Gilles Chiquito B on: 09/30/2014 03:57 PM   Modules accepted: Level of Service

## 2014-10-19 ENCOUNTER — Other Ambulatory Visit: Payer: Self-pay | Admitting: *Deleted

## 2014-10-19 ENCOUNTER — Ambulatory Visit (INDEPENDENT_AMBULATORY_CARE_PROVIDER_SITE_OTHER): Payer: Medicare Other | Admitting: Podiatry

## 2014-10-19 ENCOUNTER — Encounter: Payer: Self-pay | Admitting: Podiatry

## 2014-10-19 DIAGNOSIS — B351 Tinea unguium: Secondary | ICD-10-CM | POA: Diagnosis not present

## 2014-10-19 DIAGNOSIS — M1711 Unilateral primary osteoarthritis, right knee: Secondary | ICD-10-CM

## 2014-10-19 DIAGNOSIS — Q828 Other specified congenital malformations of skin: Secondary | ICD-10-CM

## 2014-10-19 DIAGNOSIS — M79676 Pain in unspecified toe(s): Secondary | ICD-10-CM

## 2014-10-19 MED ORDER — OXYCODONE-ACETAMINOPHEN 10-325 MG PO TABS: 1.0000 | ORAL_TABLET | Freq: Four times a day (QID) | ORAL | Status: DC | PRN

## 2014-10-19 NOTE — Telephone Encounter (Signed)
Last refill 3/7 Last visit 09/28/14

## 2014-10-19 NOTE — Progress Notes (Signed)
Patient ID: [name], male   DOB: [birth year], 69 y.o.   MRN: [id number]    Subjective: This patient presents complaining of painful toenails and keratoses and requests skin a nail debridement. He was last seen for similar problems on 04/07/2014 by Dr. Blenda Mounts  Objective: The toenails are brittle, elongated, discolored, hypertrophic and tender to palpation 6-10 Well-organized plantar keratoses sub-first MPJ right Keratoses fifth MPJ left minimal Small keratoses medial right plantar arch  Assessment: Symptomatic onychomycoses 6-10 Keratoses 3 with primary lesion plantar right first MPJ  Plan: Debridement toenails 10 and keratoses 3 without any bleeding  Reappoint at three-month intervals

## 2014-10-26 ENCOUNTER — Other Ambulatory Visit: Payer: Self-pay | Admitting: Internal Medicine

## 2014-10-26 DIAGNOSIS — I10 Essential (primary) hypertension: Secondary | ICD-10-CM

## 2014-10-26 DIAGNOSIS — M48062 Spinal stenosis, lumbar region with neurogenic claudication: Secondary | ICD-10-CM

## 2014-10-27 ENCOUNTER — Other Ambulatory Visit: Payer: Self-pay | Admitting: Internal Medicine

## 2014-10-27 DIAGNOSIS — M1711 Unilateral primary osteoarthritis, right knee: Secondary | ICD-10-CM

## 2014-11-16 ENCOUNTER — Telehealth: Payer: Self-pay | Admitting: *Deleted

## 2014-11-16 NOTE — Telephone Encounter (Signed)
Manager of apt complex/ friend calls and ask for an appt for Thursday for pt, she states he is having pain in his R lower leg and it is hard to walk at times,it seems swollen also he denies redness or warmth to area she states he has been going through a lot of stress as his daughter died last week and was just laid to rest this weekend. i spoke to pt ask him to stretch his leg out and pull his toes up toward his knee, he states it is painful to do so, iask if he could come in and be seen today and he states no to office and urg care, his friend states it will need to be Thursday, i explained that there was a concern for clot and would like him to be seen today, he relented to tomorrow am at [facility] dr sadek. He is ask that if pain becomes worse, he has shortness of breath or chest pain that he call 911, he is agreeable

## 2014-11-16 NOTE — Telephone Encounter (Signed)
Agree with appt today but understand that he refused. Agree with ED if status changes

## 2014-11-17 ENCOUNTER — Ambulatory Visit (INDEPENDENT_AMBULATORY_CARE_PROVIDER_SITE_OTHER): Payer: Medicare Other | Admitting: Internal Medicine

## 2014-11-17 ENCOUNTER — Encounter: Payer: Self-pay | Admitting: Internal Medicine

## 2014-11-17 ENCOUNTER — Ambulatory Visit (HOSPITAL_COMMUNITY)
Admission: RE | Admit: 2014-11-17 | Discharge: 2014-11-17 | Disposition: A | Discharge status: Home / Self Care | Payer: Medicare Other | Source: Ambulatory Visit | Attending: Internal Medicine | Admitting: Internal Medicine

## 2014-11-17 VITALS — BP 127/54 | HR 70 | Temp 98.1°F | Ht 74.0 in | Wt 282.8 lb

## 2014-11-17 DIAGNOSIS — M79606 Pain in leg, unspecified: Secondary | ICD-10-CM | POA: Diagnosis not present

## 2014-11-17 DIAGNOSIS — R609 Edema, unspecified: Secondary | ICD-10-CM | POA: Diagnosis not present

## 2014-11-17 DIAGNOSIS — M79604 Pain in right leg: Secondary | ICD-10-CM

## 2014-11-17 NOTE — Progress Notes (Signed)
Subjective:   Patient ID: [name] male   DOB: 02-18-1946 69 y.o.   MRN: [id number]  HPI: [name] is a 69 y.o. man pmh as listed below presents for acute right leg pain.   Pt states that over the past week he has had increasing right lower extremity edema and some pain causing some resection with ambulation. The patient has been sitting and not walking as much given the pain in his right leg. He states that it intermittently gets swollen and then decreases in size but has consistently stayed swollen over the past week. He denies any trauma to the area, any fever or chills, or any rashes. He has not tried anything over-the-counter for the pain but says that his Percocets "take the edge off." He has not had any recent surgeries and no known history of clots. He has not had any paresthesias or significant weakness in the right leg. He denied any back pain.  Past Medical History  Diagnosis Date  . Essential hypertension 06/15/2006  . 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 Mild aortic valve stenosis 10/09/2006    Echo (12/09/2009): Valve area: 2.03 cm2   . Lumbar stenosis with neurogenic claudication 06/16/2006    Moderate: L2 through L4.  Complicated by chronic low back pain and neuropathy.  Nerve conduction study (10/19/11): Diffusely low motor amplitudes, with primarily involvement of the peroneal and posterior tibial nerves. No evidence of generalized peripheral neuropathy. Findings could be c/w bil multilevel lumbosacral radiculopathies or a primary motor neuropathy.   . Osteoarthritis 11/25/2009    Right knee (medial compartment), right hip, left shoulder   . Obesity, Class I, BMI 30-34.9 04/05/2012  . Gastroesophageal reflux disease 02/04/2007  . B12 deficiency 10/01/2011    Discovered to 2 progressive neurologic pain and dysfunction.  Diagnosis established March 2013.  Requires the following treatment: B12  IM monthly until level normalizes. After that, oral supplementation.   . 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 Cataract   . Neuropathy   . Diverticulosis 08/26/2013  . Internal hemorrhoids 08/26/2013  . AVM (arteriovenous malformation) of colon 08/26/2013    Cecum X 3, without bleeding on colonoscopy February 2015   . Left posterior subcapsular cataract 10/03/2013    s/p yag laser capsulotomy 10/03/2013   . Family history of malignant neoplasm of gastrointestinal tract 06/30/2013    Father with colon cancer age 63   . Achalasia 05/05/2013    Esophageal manometry demonstrated an elevated resting LES an incomplete relaxation but normal peristalsis.  Excellent symptomatic response to LES Botox injections.   . Chronic venous insufficiency 11/28/2013  . Onychomycosis of toenail 06/05/2014   Current Outpatient Prescriptions  Medication Sig Dispense Refill  . amLODipine (NORVASC) 10 MG tablet Take 1 tablet (10 mg total) by mouth daily. 90 tablet 3  . aspirin 81 MG EC tablet Take 81 mg by mouth daily.      [name] gabapentin (NEURONTIN) 300 MG capsule Take 2 capsules (600 mg total) by mouth 3 (three) times daily. 540 capsule 3  . latanoprost (XALATAN) 0.005 % ophthalmic solution     . lisinopril-hydrochlorothiazide (PRINZIDE,ZESTORETIC) 20-12.5 MG per tablet Take 2 tablets by mouth daily. 180 tablet 3  . meloxicam (MOBIC) 7.5 MG tablet Take 1 tablet (7.5 mg total) by mouth daily as needed for pain. 90 tablet 3  . ofloxacin (  OCUFLOX) 0.3 % ophthalmic solution     . omeprazole (PRILOSEC) 20 MG capsule Take 1 capsule (20 mg total) by mouth 2 (two) times daily before a meal. 180 capsule 3  . oxyCODONE-acetaminophen (PERCOCET) 10-325 MG per tablet Take 1 tablet by mouth every 6 (six) hours as needed for pain. 90 tablet 0  . prednisoLONE acetate (PRED FORTE) 1 % ophthalmic suspension Place 1 drop into the right eye 4 (four) times daily.     . vitamin B-12  (CYANOCOBALAMIN) 1000 MCG tablet Take 1 tablet (1,000 mcg total) by mouth daily. 90 tablet 3   No current facility-administered medications for this visit.   Family History  Problem Relation Age of Onset  . Heart disease Mother   . Colon cancer Father 48  . Kidney disease Brother     on dialysis  . Pancreatic cancer Brother   . Hypertension Sister   . Diabetes Sister   . Anesthesia problems Neg Hx   . Kidney disease Brother     on dialysis  . Diabetes Sister    History   Social History  . Marital Status: Single    Spouse Name: N/A  . Number of Children: N/A  . Years of Education: N/A   Social History Main Topics  . Smoking status: Former Smoker -- 0.50 packs/day for 30 years    Types: Cigarettes    Quit date: 11/05/2010  . Smokeless tobacco: Never Used  . Alcohol Use: No     Comment: Heavy in the past  . Drug Use: No  . Sexual Activity: No     Comment: quit in 2014   Other Topics Concern  . None   Social History Narrative   Review of Systems: Pertinent items are noted in HPI. Objective:  Physical Exam: Filed Vitals:   11/17/14 0830  BP: 127/54  Pulse: 70  Temp: 98.1 F (36.7 C)  TempSrc: Oral  Height: 6\' 2"  (1.88 m)  Weight: 282 lb 12.8 oz (128.277 kg)  SpO2: 100%   General: sitting in chair, NAD Cardiac: RRR, no rubs, murmurs or gallops Pulm: clear to auscultation bilaterally, moving normal volumes of air Abd: soft, nontender, nondistended, BS present Ext: warm and well perfused, 1+ pedal edema on right side, right side assymmetry larger than left, 2+ DP pulses, no erythema or warmth, some slight palpable soft tissue swelling on dorsal aspect of right calf near ankle, no rashes or erythema, multiple areas on anterior shins of hypopigmentation  Assessment & Plan:  Please see problem oriented charting  Pt discussed with Dr. Eppie Gibson

## 2014-11-17 NOTE — Assessment & Plan Note (Addendum)
Per the patient is has been acutely worsening over the past week and he has been more sedentary. There is concern given the right leg asymmetry on physical exam for possible DVT. The patient is pressed for time as he has poor transportation therefore he was sent immediately for bilateral lower extremity Dopplers for DVT. Pt has no weakness or neurological symptoms to suggest progression of lumbar stenosis.  -call pt with results if positive -pt given refill of his percocet per his PCP  ADDENDUM: pt dopplers were negative for DVT or bakers cyst. Continue to monitor.

## 2014-11-17 NOTE — Progress Notes (Signed)
Case discussed with Dr. Sadek at time of visit.  We reviewed the resident's history and exam and pertinent patient test results.  I agree with the assessment, diagnosis, and plan of care documented in the resident's note. 

## 2014-11-17 NOTE — Patient Instructions (Signed)
General Instructions:   Please try to bring all your medicines next time. This will help [place] keep you safe from mistakes.   Progress Toward Treatment Goals:  Treatment Goal 08/31/2014  Blood pressure at goal  Prevent falls -    Self Care Goals & Plans:  Self Care Goal 11/17/2014  Manage my medications take my medicines as prescribed; bring my medications to every visit; refill my medications on time  Monitor my health -  Eat healthy foods drink diet soda or water instead of juice or soda; eat more vegetables; eat foods that are low in salt; eat baked foods instead of fried foods; eat fruit for snacks and desserts  Be physically active -  Prevent falls -  Meeting treatment goals -    No flowsheet data found.   Care Management & Community Referrals:  Referral 08/31/2014  Referrals made for care management support none needed  Referrals made to community resources none

## 2014-11-17 NOTE — Progress Notes (Signed)
*  PRELIMINARY RESULTS* Vascular Ultrasound Lower extremity venous duplex has been completed.  Preliminary findings: negative for DVT in visualized veins.  Landry Mellow, RDMS, RVT  11/17/2014, 10:28 AM

## 2014-11-24 ENCOUNTER — Other Ambulatory Visit: Payer: Self-pay | Admitting: Internal Medicine

## 2014-11-26 ENCOUNTER — Telehealth: Payer: Self-pay | Admitting: Internal Medicine

## 2014-11-26 NOTE — Telephone Encounter (Signed)
Call to patient to confirm appointment for 11/27/14 at 9:45 lmtcb

## 2014-11-27 ENCOUNTER — Ambulatory Visit (INDEPENDENT_AMBULATORY_CARE_PROVIDER_SITE_OTHER): Payer: Medicare Other | Admitting: Internal Medicine

## 2014-11-27 ENCOUNTER — Encounter: Payer: Self-pay | Admitting: Internal Medicine

## 2014-11-27 VITALS — BP 152/97 | HR 64 | Temp 98.7°F | Wt 284.6 lb

## 2014-11-27 DIAGNOSIS — M159 Polyosteoarthritis, unspecified: Secondary | ICD-10-CM

## 2014-11-27 DIAGNOSIS — M150 Primary generalized (osteo)arthritis: Secondary | ICD-10-CM | POA: Diagnosis not present

## 2014-11-27 DIAGNOSIS — M1711 Unilateral primary osteoarthritis, right knee: Secondary | ICD-10-CM

## 2014-11-27 DIAGNOSIS — I10 Essential (primary) hypertension: Secondary | ICD-10-CM

## 2014-11-27 NOTE — Assessment & Plan Note (Signed)
Patient requesting Rx for Ibuprofen 800 mg qhs prn. After extensive discussion, patient is doing the following daily w/ regard to his pain management: Percocet 10-325 mg once in the AM + Mobic 7.5 mg once in AM, then Ibuprofen 800 mg (OTC) qhs prn for pain. We discussed at length that he ONLY takes his Percocet once daily and he does confirm that this is the case, sometimes he says he takes two daily if needed. He also confirms that he has extra at the end of the month, usually b/w 30-60 pills (is prescribed #90 per month). The following adjustments were made in order to maximize his pain control: -Continue Percocet 10-325 mg + Mobic 7.5 mg in AM -Take Percocet 10-325 mg qhs prn in place of the over-the-counter ibuprofen. Encouraged him that if he feels this is too strong for pain control he can break the pill in half.  -Explained the possible risks of taking multiple NSAIDS (mobic, ibuprofen, ASA 81) given his previous h/o GERD at baseline. He understands and agrees that the above plan is reasonable for his pain control.  -Patient to return in 3-6 months for follow up. May need BMP given NSAID use and most recent Cr of 1.16 in 06/2013.

## 2014-11-27 NOTE — Assessment & Plan Note (Signed)
BP Readings from Last 3 Encounters:  11/27/14 152/97  11/17/14 127/54  09/28/14 137/71    Lab Results  Component Value Date   NA 139 07/01/2013   K 4.0 07/01/2013   CREATININE 1.14 07/01/2013    Assessment: Comments: BP elevated on exam today. States he has been taking his medications as prescribed and does seem to be aware of the names and dosages. BP is typically well controlled during his clinic visits.   Plan: Medications:  continue current medications; Prinzide 40-25 + Norvasc 10 Other plans: RTC in 3-6 months. May need medication adjustment if BP continues to be elevated.

## 2014-11-27 NOTE — Progress Notes (Signed)
Internal Medicine Clinic Attending  Case discussed with Dr. Jones at the time of the visit.  We reviewed the resident's history and exam and pertinent patient test results.  I agree with the assessment, diagnosis, and plan of care documented in the resident's note.  

## 2014-11-27 NOTE — Progress Notes (Signed)
Subjective:   Patient ID: [name] male   DOB: 1945/12/08 69 y.o.   MRN: [id number]  HPI: [name] is a 69 y.o. male w/ PMHx of HTN, chronic back pain, OA, GERD, and chronic venous insufficiency, presents to the clinic today for a follow-up visit for pain control. Patient has had several ongoing issues regarding pain related to his OA and has tried multiple NSAIDS in the past and was started on Percocet 10-325 mg q6h prn for pain. Patient was also seen in 09/2014 by Dr. Heber Port [name] and started on Mobic 7.5 mg daily at that time. Today, the patient says his pain is moderately well controlled but is interested in getting Ibuprofen 800 mg tablets that he can take at night prn for pain. After prolonged discussion, his pain management plan for each day is as follows: Percocet 10-325 mg once in the AM + Mobic 7.5 mg once in AM, then Ibuprofen 800 mg (OTC) qhs prn for pain. I made sure to clarify that he truly only takes his Percocet ONCE in the AM and he confirms this, but says every now and then he will take 2 in a day, never more. Per chart review, the patient is given #90 per month and he states he typically has somewhere b/w 30-60 left at the end of the month. I then asked why he doesn't take the Percocet to help his pain at night instead of the Ibuprofen and he says that sometimes it was too strong.   The patient has no other significant complaints today.   Past Medical History  Diagnosis Date  . Essential hypertension 06/15/2006  . 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 Mild aortic valve stenosis 10/09/2006    Echo (12/09/2009): Valve area: 2.03 cm2   . Lumbar stenosis with neurogenic claudication 06/16/2006    Moderate: L2 through L4.  Complicated by chronic low back pain and neuropathy.  Nerve conduction study (10/19/11): Diffusely low motor amplitudes, with primarily involvement of the peroneal and posterior  tibial nerves. No evidence of generalized peripheral neuropathy. Findings could be c/w bil multilevel lumbosacral radiculopathies or a primary motor neuropathy.   . Osteoarthritis 11/25/2009    Right knee (medial compartment), right hip, left shoulder   . Obesity, Class I, BMI 30-34.9 04/05/2012  . Gastroesophageal reflux disease 02/04/2007  . B12 deficiency 10/01/2011    Discovered to 2 progressive neurologic pain and dysfunction.  Diagnosis established March 2013.  Requires the following treatment: B12 IM monthly until level normalizes. After that, oral supplementation.   . 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 Cataract   . Neuropathy   . Diverticulosis 08/26/2013  . Internal hemorrhoids 08/26/2013  . AVM (arteriovenous malformation) of colon 08/26/2013    Cecum X 3, without bleeding on colonoscopy February 2015   . Left posterior subcapsular cataract 10/03/2013    s/p yag laser capsulotomy 10/03/2013   . Family history of malignant neoplasm of gastrointestinal tract 06/30/2013    Father with colon cancer age 28   . Achalasia 05/05/2013    Esophageal manometry demonstrated an elevated resting LES an incomplete relaxation but normal peristalsis.  Excellent symptomatic response to LES Botox injections.   . Chronic venous insufficiency 11/28/2013  . Onychomycosis of toenail 06/05/2014   Current Outpatient Prescriptions  Medication Sig Dispense Refill  . amLODipine (NORVASC) 10 MG tablet Take  1 tablet (10 mg total) by mouth daily. 90 tablet 3  . aspirin 81 MG EC tablet Take 81 mg by mouth daily.      [name] gabapentin (NEURONTIN) 300 MG capsule Take 2 capsules (600 mg total) by mouth 3 (three) times daily. 540 capsule 3  . latanoprost (XALATAN) 0.005 % ophthalmic solution     . lisinopril-hydrochlorothiazide (PRINZIDE,ZESTORETIC) 20-12.5 MG per tablet Take 2 tablets by mouth daily. 180 tablet 3  . meloxicam (MOBIC) 7.5 MG tablet Take 1 tablet (7.5 mg  total) by mouth daily as needed for pain. 90 tablet 3  . ofloxacin (OCUFLOX) 0.3 % ophthalmic solution     . omeprazole (PRILOSEC) 20 MG capsule Take 1 capsule (20 mg total) by mouth 2 (two) times daily before a meal. 180 capsule 3  . oxyCODONE-acetaminophen (PERCOCET) 10-325 MG per tablet Take 1 tablet by mouth every 6 (six) hours as needed for pain. 90 tablet 0  . prednisoLONE acetate (PRED FORTE) 1 % ophthalmic suspension Place 1 drop into the right eye 4 (four) times daily.     . vitamin B-12 (CYANOCOBALAMIN) 1000 MCG tablet Take 1 tablet (1,000 mcg total) by mouth daily. 90 tablet 3   No current facility-administered medications for this visit.    Review of Systems  General: Denies fever, diaphoresis, appetite change, and fatigue.  Respiratory: Denies SOB, cough, and wheezing.   Cardiovascular: Denies chest pain and palpitations.  Gastrointestinal: Denies nausea, vomiting, abdominal pain, and diarrhea Musculoskeletal: Positive for arthralgias and back pain. Denies myalgias and gait problem.  Neurological: Denies dizziness, syncope, weakness, lightheadedness, and headaches.  Psychiatric/Behavioral: Denies mood changes, sleep disturbance, and agitation.   Objective:   Physical Exam: Filed Vitals:   11/27/14 0956  BP: 152/97  Pulse: 64  Temp: 98.7 F (37.1 C)  TempSrc: Oral  Weight: 284 lb 9.6 oz (129.094 kg)  SpO2: 99%    General: Elderly AA male, alert, cooperative, NAD. HEENT: PERRL, EOMI. Moist mucus membranes Neck: Full range of motion without pain, supple, no lymphadenopathy or carotid bruits Lungs: Clear to ascultation bilaterally, normal work of respiration, no wheezes, rales, rhonchi Heart: RRR, no murmurs, gallops, or rubs Abdomen: Soft, non-tender, non-distended, BS + Extremities: No cyanosis or clubbing. Bilateral venous stasis changes w/ +2 pitting edema extending to the just below the knee. No tenderness on exam.  Neurologic: Alert & oriented x3, cranial  nerves II-XII intact, strength grossly intact, sensation intact to light touch   Assessment & Plan:   Please see problem based assessment and plan.

## 2014-11-27 NOTE — Patient Instructions (Signed)
1. Please schedule a follow up appointment in 3 months.  2. Please take all medications as previously prescribed with the following changes:  STOP taking over-the-counter Ibuprofen at night.   Continue to take Percocet 10-325 mg in the AM and start taking one tablet in the evening as well IN PLACE of the Ibuprofen you were taking.  Continue to take Mobic 7.5 mg daily for anti-inflammatory benefits.   3. If you have worsening of your symptoms or new symptoms arise, please call the clinic ([phone]), or go to the ER immediately if symptoms are severe.

## 2014-12-15 ENCOUNTER — Other Ambulatory Visit: Payer: Self-pay | Admitting: Internal Medicine

## 2014-12-15 NOTE — Telephone Encounter (Signed)
Pt notified he may pick up script, he will come in 6/22

## 2014-12-15 NOTE — Telephone Encounter (Signed)
Patient requesting prescription refill for pain medication

## 2014-12-16 ENCOUNTER — Telehealth: Payer: Self-pay | Admitting: *Deleted

## 2014-12-16 DIAGNOSIS — M1711 Unilateral primary osteoarthritis, right knee: Secondary | ICD-10-CM

## 2014-12-16 MED ORDER — OXYCODONE-ACETAMINOPHEN 10-325 MG PO TABS: 1.0000 | ORAL_TABLET | Freq: Four times a day (QID) | ORAL | Status: DC | PRN

## 2014-12-16 NOTE — Telephone Encounter (Signed)
Pt was asking if in July he could get 3 scripts for pain med since it is so hard for him to travel to pick up scripts, he made an appt for sept and plans to keep the appt.

## 2014-12-16 NOTE — Telephone Encounter (Signed)
He is much more functional with the percocet PRN from an ambulation standpoint.  Transportation is an issue for him and my suspicion for diversion is low.  I have printed 3 months of prescriptions for percocet so that he can pick them up at once.  Please obtain a urine drug screen at the time he picks up the prescriptions.  Please do not provide the prescription if a urine sample is not provided.  The order has been placed.  Thank You.

## 2014-12-31 ENCOUNTER — Other Ambulatory Visit: Payer: Self-pay | Admitting: Physician Assistant

## 2015-01-18 ENCOUNTER — Other Ambulatory Visit: Payer: Self-pay | Admitting: Internal Medicine

## 2015-01-18 ENCOUNTER — Ambulatory Visit: Payer: Medicare Other | Admitting: Podiatry

## 2015-01-18 NOTE — Telephone Encounter (Signed)
Pt informed Rx is ready. He will need to pick them up himself.

## 2015-01-18 NOTE — Telephone Encounter (Signed)
States that she had a miss call about patient medication she was returning the call about the refill.

## 2015-01-19 ENCOUNTER — Other Ambulatory Visit: Payer: Medicare Other

## 2015-01-19 DIAGNOSIS — M1711 Unilateral primary osteoarthritis, right knee: Secondary | ICD-10-CM | POA: Diagnosis not present

## 2015-01-19 DIAGNOSIS — Z79899 Other long term (current) drug therapy: Secondary | ICD-10-CM | POA: Diagnosis not present

## 2015-01-20 LAB — PRESCRIPTION ABUSE MONITORING 15P, URINE
Amphetamine/Meth: NEGATIVE ng/mL
Barbiturate Screen, Urine: NEGATIVE ng/mL
Benzodiazepine Screen, Urine: NEGATIVE ng/mL
Buprenorphine, Urine: NEGATIVE ng/mL
Cannabinoid Scrn, Ur: NEGATIVE ng/mL
Carisoprodol, Urine: NEGATIVE ng/mL
Cocaine Metabolites: NEGATIVE ng/mL
Creatinine, Urine: 184.17 mg/dL
Fentanyl, Ur: NEGATIVE ng/mL
Meperidine, Ur: NEGATIVE ng/mL
Methadone Screen, Urine: NEGATIVE ng/mL
Opiate Screen, Urine: NEGATIVE ng/mL
Oxycodone Screen, Ur: NEGATIVE ng/mL
Propoxyphene: NEGATIVE ng/mL
Tramadol Scrn, Ur: NEGATIVE ng/mL
Zolpidem, Urine: NEGATIVE ng/mL

## 2015-01-28 DIAGNOSIS — H4011X2 Primary open-angle glaucoma, moderate stage: Secondary | ICD-10-CM | POA: Diagnosis not present

## 2015-01-28 DIAGNOSIS — H4611 Retrobulbar neuritis, right eye: Secondary | ICD-10-CM | POA: Diagnosis not present

## 2015-01-28 DIAGNOSIS — H3521 Other non-diabetic proliferative retinopathy, right eye: Secondary | ICD-10-CM | POA: Diagnosis not present

## 2015-01-28 DIAGNOSIS — Z961 Presence of intraocular lens: Secondary | ICD-10-CM | POA: Diagnosis not present

## 2015-01-28 DIAGNOSIS — H33021 Retinal detachment with multiple breaks, right eye: Secondary | ICD-10-CM | POA: Diagnosis not present

## 2015-02-02 ENCOUNTER — Ambulatory Visit (INDEPENDENT_AMBULATORY_CARE_PROVIDER_SITE_OTHER): Payer: Medicare Other | Admitting: Podiatry

## 2015-02-02 ENCOUNTER — Encounter: Payer: Self-pay | Admitting: Podiatry

## 2015-02-02 VITALS — BP 126/74 | HR 71 | Resp 14

## 2015-02-02 DIAGNOSIS — M79676 Pain in unspecified toe(s): Secondary | ICD-10-CM

## 2015-02-02 DIAGNOSIS — Q828 Other specified congenital malformations of skin: Secondary | ICD-10-CM | POA: Diagnosis not present

## 2015-02-02 DIAGNOSIS — B351 Tinea unguium: Secondary | ICD-10-CM | POA: Diagnosis not present

## 2015-02-02 NOTE — Progress Notes (Signed)
Patient ID: [name], male   DOB: [birth year], 69 y.o.   MRN: [id number]  Subjective: This patient presents for scheduled visit complaining of painful toenails and multiple painful plantar keratoses right and left feet  Objective: The toenails are elongated, brittle, 8 incurvated, discolored and tender to direct palpation 6-10 Multiple nucleated plantar keratoses 5 right 2 left  Assessment: Symptomatic onychomycoses 6-10 Porokeratosis 7  Plan: Debridement of toenails mechanically electrically without any bleeding Debrided porokeratosis 7 without any bleeding  Reappoint 3 months

## 2015-02-24 ENCOUNTER — Other Ambulatory Visit: Payer: Self-pay | Admitting: Physician Assistant

## 2015-03-03 ENCOUNTER — Encounter: Payer: Self-pay | Admitting: *Deleted

## 2015-03-11 ENCOUNTER — Ambulatory Visit (INDEPENDENT_AMBULATORY_CARE_PROVIDER_SITE_OTHER): Payer: Medicare Other | Admitting: Internal Medicine

## 2015-03-11 ENCOUNTER — Encounter: Payer: Self-pay | Admitting: Internal Medicine

## 2015-03-11 VITALS — BP 128/70 | HR 67 | Temp 98.1°F | Wt 283.0 lb

## 2015-03-11 DIAGNOSIS — E669 Obesity, unspecified: Secondary | ICD-10-CM | POA: Diagnosis not present

## 2015-03-11 DIAGNOSIS — K220 Achalasia of cardia: Secondary | ICD-10-CM

## 2015-03-11 DIAGNOSIS — I872 Venous insufficiency (chronic) (peripheral): Secondary | ICD-10-CM

## 2015-03-11 DIAGNOSIS — Z23 Encounter for immunization: Secondary | ICD-10-CM

## 2015-03-11 DIAGNOSIS — M1711 Unilateral primary osteoarthritis, right knee: Secondary | ICD-10-CM

## 2015-03-11 DIAGNOSIS — Z0000 Encounter for general adult medical examination without abnormal findings: Secondary | ICD-10-CM | POA: Diagnosis not present

## 2015-03-11 DIAGNOSIS — I350 Nonrheumatic aortic (valve) stenosis: Secondary | ICD-10-CM

## 2015-03-11 DIAGNOSIS — M150 Primary generalized (osteo)arthritis: Secondary | ICD-10-CM | POA: Diagnosis not present

## 2015-03-11 DIAGNOSIS — I10 Essential (primary) hypertension: Secondary | ICD-10-CM | POA: Diagnosis not present

## 2015-03-11 DIAGNOSIS — M4806 Spinal stenosis, lumbar region: Secondary | ICD-10-CM | POA: Diagnosis not present

## 2015-03-11 DIAGNOSIS — E538 Deficiency of other specified B group vitamins: Secondary | ICD-10-CM | POA: Diagnosis not present

## 2015-03-11 DIAGNOSIS — K5909 Other constipation: Secondary | ICD-10-CM

## 2015-03-11 DIAGNOSIS — K5903 Drug induced constipation: Secondary | ICD-10-CM

## 2015-03-11 DIAGNOSIS — T402X5A Adverse effect of other opioids, initial encounter: Secondary | ICD-10-CM

## 2015-03-11 DIAGNOSIS — M65351 Trigger finger, right little finger: Secondary | ICD-10-CM

## 2015-03-11 DIAGNOSIS — M48062 Spinal stenosis, lumbar region with neurogenic claudication: Secondary | ICD-10-CM

## 2015-03-11 DIAGNOSIS — K219 Gastro-esophageal reflux disease without esophagitis: Secondary | ICD-10-CM | POA: Diagnosis not present

## 2015-03-11 HISTORY — DX: Drug induced constipation: K59.03

## 2015-03-11 HISTORY — DX: Trigger finger, right little finger: M65.351

## 2015-03-11 HISTORY — DX: Adverse effect of other opioids, initial encounter: T40.2X5A

## 2015-03-11 MED ORDER — OMEPRAZOLE 20 MG PO CPDR: 20.0000 mg | DELAYED_RELEASE_CAPSULE | Freq: Every day | ORAL | Status: DC

## 2015-03-11 MED ORDER — POLYETHYLENE GLYCOL 3350 17 GM/SCOOP PO POWD: 17.0000 g | Freq: Every day | ORAL | Status: DC | PRN

## 2015-03-11 MED ORDER — IBUPROFEN 800 MG PO TABS: 800.0000 mg | ORAL_TABLET | Freq: Three times a day (TID) | ORAL | Status: DC | PRN

## 2015-03-11 MED ORDER — OMEPRAZOLE 20 MG PO CPDR: 20.0000 mg | DELAYED_RELEASE_CAPSULE | Freq: Two times a day (BID) | ORAL | Status: DC

## 2015-03-11 NOTE — Assessment & Plan Note (Signed)
He continues to have dependent lower extremity pitting edema. The degree is unchanged and does not impact his ambulation. When his 8 is around and can help with his compression stockings he was encouraged to use them. We will reassess his chronic venous insufficiency at the follow-up visit.

## 2015-03-11 NOTE — Assessment & Plan Note (Signed)
He states he has discontinued his vitamin B12 oral supplementation for unclear reasons. Before restarting it we will reassess his vitamin B12 level at the next visit when we are drawing other labs given his fear of needles.

## 2015-03-11 NOTE — Assessment & Plan Note (Signed)
His blood pressure today is 128/70 which is well within the target. This is on amlodipine 10 mg by mouth daily and lisinopril-hydrochlorothiazide 40-25 mg by mouth daily. As he is tolerating this regimen well we will continue both of these medications at their current dose. We will reassess his blood pressure at the follow-up visit.

## 2015-03-11 NOTE — Assessment & Plan Note (Signed)
He is asymptomatic on the gabapentin 600 mg by mouth 3 times daily. He is tolerating this medication well at this dose. We will therefore continue the medication and reassess his symptoms at the follow-up visit

## 2015-03-11 NOTE — Assessment & Plan Note (Signed)
On the once daily omeprazole at 20 mg he notes no gastroesophageal reflux symptoms. Therefore his omeprazole prescription was changed from twice daily to once daily. We will reassess for evidence of acid reflux symptoms at the follow-up visit. If there are none we may consider further de-escalation to H2 blocker therapy.

## 2015-03-11 NOTE — Assessment & Plan Note (Signed)
He has had no syncope, chest pain, or myocardial infarction. The last time his aortic valve stenosis was assessed by echocardiogram was in 2011. At that time he was felt to have mild disease. He continues to have a murmur consistent with aortic stenosis. Given that it is asymptomatic I do not feel pushed to repeat an echocardiogram to reassess. We will continue with auscultation paying particular attention to the second heart sound. If it were to decrease in intensity we would reassess with a repeat echocardiogram. Obviously we would reassess for progression of his aortic stenosis should he become symptomatic in other ways.

## 2015-03-11 NOTE — Assessment & Plan Note (Signed)
Over the last month [name] has had difficulty making a fist with his right hand. This is partially due to mild swelling of the hand. What is more concerning to him is the inability to extend the pinky on the right hand after making a fist. It locks in the flexed position. He manually has to extend it which is painful. He demonstrated this to me in the exam room. Once extended the pinky functions normally. 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 This has not responded to the NSAID therapy he has been using for his right knee osteoarthritis. We decided he may benefit from further evaluation by sports medicine for either splinting or an injection of a glucocorticoid in that area. He has not failed conservative therapy to warrant a surgical evaluation. An ambulatory referral to sports medicine has been made and we will reassess the right pinky trigger finger at the follow-up visit after conservative intervention.

## 2015-03-11 NOTE — Assessment & Plan Note (Signed)
He was given the flu vaccination today. He is not interested in Zostavax at this time. He is otherwise up-to-date on his preventative health care maintenance.

## 2015-03-11 NOTE — Assessment & Plan Note (Signed)
He noted occasional constipation which is likely related to the increase in his Percocet dose to twice daily. Given the intermittent nature of his constipation we decided to try MiraLAX 1 heaping tablespoon in 8 ounces of fluid daily as needed for constipation. We will reassess if this bowel regimen is adequate and addressing his symptoms at the follow-up visit.

## 2015-03-11 NOTE — Assessment & Plan Note (Signed)
His right knee has actually been stable. If anything he notes a slight improvement as he is able to ambulate more without difficulty. He has been using the Percocet twice daily with good effect. Occasionally he will use the meloxicam in the morning. He found the ibuprofen 800 mg by mouth to be more effective as an add needed non-steroidal anti-inflammatory agent. We will therefore continued the Percocet 10-325 mg 1 tablet every 12 hours as needed for pain. I discontinued the meloxicam and prescribed ibuprofen 800 mg by mouth every 8 hours as needed for pain. We will reassess his function with this slight change. That being said, his function has improved by increasing the Percocet to twice daily, hence it will be continued. When he is due for the next refill I will cut the Percocet back to #60 per month given this is his usage. A urine drug screen has been obtained as he states he took a Percocet this morning. The result is pending at the time of this dictation.

## 2015-03-11 NOTE — Assessment & Plan Note (Signed)
His weight is down 1 pound in the last 3 months. He was congratulated on his success at keeping it stable and was encouraged to continue to exercise and watch his diet is much as possible. The goal is to decrease his weight further to assist in the management of his right knee osteoarthritis and pain. He is well aware of this connection and he will continue to work on his diet and maintaining as much activity as he can.

## 2015-03-11 NOTE — Progress Notes (Signed)
   Subjective:    Patient ID: [name], male    DOB: February 28, 1946, 69 y.o.   MRN: [id number]  HPI  [name] is here for follow-up of his right knee osteoarthritis and achalasia. Please see the A&P for the status of the pt's chronic medical problems.  Review of Systems  Constitutional: Negative for activity change, appetite change and unexpected weight change.  Respiratory: Negative for cough, choking and shortness of breath.   Cardiovascular: Positive for leg swelling. Negative for chest pain.       Lower extremity swelling is unchanged from baseline  Gastrointestinal: Positive for constipation. Negative for nausea, vomiting, abdominal pain and diarrhea.       He has noted occasional constipation  Musculoskeletal: Positive for arthralgias and gait problem. Negative for myalgias and joint swelling.       Right knee arthralgia is unchanged.  Right hand is harder to make a fist with and when opening the hand he has locking of the pinky finger and has to manually extend it  Skin: Negative for rash and wound.      Objective:   Physical Exam  Constitutional: He is oriented to person, place, and time. He appears well-developed and well-nourished. No distress.  HENT:  Head: Normocephalic and atraumatic.  Eyes: Conjunctivae are normal. Right eye exhibits no discharge. Left eye exhibits no discharge. No scleral icterus.  Cardiovascular: Normal rate and regular rhythm.  Exam reveals no gallop and no friction rub.   Murmur heard. III/VI systolic crescendo-decrescendo murmur loudest at the LUSB but also heard at the LLSB and less so at the apex  Pulmonary/Chest: Effort normal and breath sounds normal. No respiratory distress. He has no wheezes. He has no rales.  Abdominal: Soft. Bowel sounds are normal. He exhibits no distension. There is no tenderness. There is no rebound and no guarding.  Musculoskeletal: Normal range of motion. He exhibits edema. He exhibits no tenderness.    Neurological: He is alert and oriented to person, place, and time. He exhibits normal muscle tone.  Skin: Skin is warm and dry. No rash noted. He is not diaphoretic. No erythema.  Psychiatric: He has a normal mood and affect. His behavior is normal. Judgment and thought content normal.  Nursing note and vitals reviewed.     Assessment & Plan:   Please see problem oriented charting.

## 2015-03-11 NOTE — Patient Instructions (Addendum)
It was great to see you again.  I am sorry to hear about your daughter's death.  1) I stopped the meloxicam and restarted the ibuprofen 800 mg every 8 hours as needed for pain.  2) Keep taking the percocet 1 tablet twice a day as needed for pain.  3) I made a referral to Sports Medicine to look at your right trigger finger.  They may splint it or inject it.  4) We gave you the flu shot today.  5) We started Miralax (polyethylene glycol) today.  Take 1 heaping tablespoon and mix in 8 ounces of fluid daily as needed for constipation.  6) Keep taking the other medications as you are.  I will see you back in 6 months, sooner if necessary.

## 2015-03-11 NOTE — Assessment & Plan Note (Signed)
He notes that at times his swallowing is slow moving the bolus from the mouth down to the esophagus. He states this is different than the achalasia related symptoms previously. It is been just over a year since he has received therapy for his achalasia. He currently feels this is not a big issue and is not interested in having reassessment for his achalasia at this time. He will let me know if it were to progress and he should want intervention.

## 2015-03-12 ENCOUNTER — Ambulatory Visit: Payer: Medicare Other | Admitting: Internal Medicine

## 2015-03-18 LAB — PRESCRIPTION ABUSE MONITORING 17P, URINE
6-ACETYLMORPHINE, URINE: NEGATIVE ng/mL
AMPHETAMINE SCREEN URINE: NEGATIVE ng/mL
BARBITURATE SCREEN URINE: NEGATIVE ng/mL
BENZODIAZEPINE SCREEN, URINE: NEGATIVE ng/mL
BUPRENORPHINE, URINE: NEGATIVE ng/mL
CANNABINOIDS UR QL SCN: NEGATIVE ng/mL
CARISOPRODOL/MEPROBAMATE, UR: NEGATIVE ng/mL
COCAINE(METAB.)SCREEN, URINE: NEGATIVE ng/mL
CREATININE(CRT), U: 169.3 mg/dL (ref 20.0–300.0)
EDDP, Urine: NEGATIVE ng/mL
Fentanyl, Urine: NEGATIVE pg/mL
MDMA SCREEN, URINE: NEGATIVE ng/mL
METHADONE SCREEN, URINE: NEGATIVE ng/mL
Meperidine Screen, Urine: NEGATIVE ng/mL
Nitrite Urine, Quantitative: NEGATIVE ug/mL
OPIATE SCREEN URINE: NEGATIVE ng/mL
Ph of Urine: 5.9 (ref 4.5–8.9)
Phencyclidine Qn, Ur: NEGATIVE ng/mL
Propoxyphene Scrn, Ur: NEGATIVE ng/mL
SPECIFIC GRAVITY: 1.029
TAPENTADOL, URINE: NEGATIVE ng/mL
Tramadol Screen, Urine: NEGATIVE ng/mL

## 2015-03-18 LAB — OXYCODONE/OXYMORPHONE CONFIRM
OXYCODONE/OXYMORPH: POSITIVE — AB
OXYCODONE: 1510 ng/mL
OXYCODONE: POSITIVE — AB
OXYMORPHONE CONFIRM: 1240 ng/mL
OXYMORPHONE: POSITIVE — AB

## 2015-03-24 ENCOUNTER — Ambulatory Visit (INDEPENDENT_AMBULATORY_CARE_PROVIDER_SITE_OTHER): Payer: Medicare Other | Admitting: Family Medicine

## 2015-03-24 ENCOUNTER — Encounter: Payer: Self-pay | Admitting: Family Medicine

## 2015-03-24 VITALS — BP 126/61 | HR 73 | Ht 74.0 in | Wt 280.0 lb

## 2015-03-24 DIAGNOSIS — M65351 Trigger finger, right little finger: Secondary | ICD-10-CM

## 2015-03-24 MED ORDER — METHYLPREDNISOLONE ACETATE 40 MG/ML IJ SUSP
20.0000 mg | Freq: Once | INTRAMUSCULAR | Status: AC
  Administered 2015-03-24: 20 mg via INTRA_ARTICULAR

## 2015-03-24 NOTE — Progress Notes (Signed)
  [name] - 69 y.o. male MRN [id number]  Date of birth: [birth year] [name] is a 69 y.o. male who presents today for right fifth trigger finger.  Right fifth trigger finger initial visit-patient is been having ongoing triggering of the right fifth digit for the past 3-4 weeks now. He has never had this before. There was no inciting injury. He is right hand dominant with no previous injury. No previous treatment to date. There is no paresthesias or abnormal sensation in the lateral aspect of his right hand.  PMHx - Updated and reviewed.  Contributory factors include: OA of the right knee. PSHx - Updated and reviewed.  Contributory factors include:  Noncontributory FHx - Updated and reviewed.  Contributory factors include:  Noncontributory Medications - Neurontin, Percocet   ROS Per HPI . 12 point negative otherwise  Exam:  Filed Vitals:   03/24/15 1326  BP: 126/61  Pulse: 73   Gen: NAD Cardiorespiratory - Normal respiratory effort/rate.  RRR R hand : Normal appearance without ecchymosis or anatomic deformity. Tenderness to palpation with triggering and nodule at fifth MCP/fifth metacarpal head. Normal range of motion with neurovascular intact right hand.

## 2015-03-24 NOTE — Assessment & Plan Note (Signed)
History and exam consistent with flexor tenosynovium right US of the A1 pulley. -He has tried pain medication without success. We will give him a finger splint and injection A1 pulley under ultrasound guidance. -Explained that 50% of these will be asymptomatic at 12 months with injection. If no improvement without injection and for 6 weeks can consider repeat versus surgical release.   Aspiration/Injection Procedure Note [name] February 12, 1946  Procedure: Injection Indications: Pain/Triggering  Procedure Details Consent: Risks of procedure as well as the alternatives and risks of each were explained to the (patient/caregiver).  Consent for procedure obtained. Time Out: Verified patient identification, verified procedure, site/side was marked, verified correct patient position, special equipment/implants available, medications/allergies/relevent history reviewed, required imaging and test results available.  Performed.  The area was cleaned with iodine and alcohol swabs.    The R 5th MCP A1 Pulley  was injected using 0.5 cc's of 40mg  Depomedrol and 0.5 cc's of 1% lidocaine with a TB needle.  Ultrasound was used. Images were obtained in Transverse and Long views showing the injection.     A sterile dressing was applied.  Patient did tolerate procedure well. Estimated blood loss: None

## 2015-04-12 ENCOUNTER — Other Ambulatory Visit: Payer: Self-pay | Admitting: Internal Medicine

## 2015-04-12 DIAGNOSIS — M1711 Unilateral primary osteoarthritis, right knee: Secondary | ICD-10-CM

## 2015-04-12 MED ORDER — OXYCODONE-ACETAMINOPHEN 10-325 MG PO TABS: 1.0000 | ORAL_TABLET | Freq: Four times a day (QID) | ORAL | Status: DC | PRN

## 2015-04-12 NOTE — Telephone Encounter (Signed)
Med refill request on pt's percocet. Confirmed with pharmacy-last refill on 03/09/15 #90, no additional scripts in pt's folder-will request to pt's pcp for review. Please advise.Despina Hidden Cassady10/17/20163:52 PM

## 2015-04-12 NOTE — Telephone Encounter (Signed)
Rxs ready to be picked up - pt called.

## 2015-04-12 NOTE — Telephone Encounter (Signed)
Patient requesting his hydrocodone to be refilled.

## 2015-04-13 ENCOUNTER — Ambulatory Visit: Payer: Medicare Other | Admitting: Family Medicine

## 2015-05-04 DIAGNOSIS — H401113 Primary open-angle glaucoma, right eye, severe stage: Secondary | ICD-10-CM | POA: Diagnosis not present

## 2015-05-04 DIAGNOSIS — H401122 Primary open-angle glaucoma, left eye, moderate stage: Secondary | ICD-10-CM | POA: Diagnosis not present

## 2015-05-11 ENCOUNTER — Ambulatory Visit: Payer: Medicare Other | Admitting: Podiatry

## 2015-05-31 ENCOUNTER — Other Ambulatory Visit: Payer: Self-pay | Admitting: Internal Medicine

## 2015-05-31 NOTE — Telephone Encounter (Signed)
Pt requesting omeprazole to be filled. Please call pt back.

## 2015-05-31 NOTE — Telephone Encounter (Signed)
Pt has 3 refills at [facility], pt aware

## 2015-06-02 ENCOUNTER — Encounter: Payer: Self-pay | Admitting: Podiatry

## 2015-06-02 ENCOUNTER — Ambulatory Visit (INDEPENDENT_AMBULATORY_CARE_PROVIDER_SITE_OTHER): Payer: Medicare Other | Admitting: Podiatry

## 2015-06-02 DIAGNOSIS — B351 Tinea unguium: Secondary | ICD-10-CM | POA: Diagnosis not present

## 2015-06-02 DIAGNOSIS — M79676 Pain in unspecified toe(s): Secondary | ICD-10-CM | POA: Diagnosis not present

## 2015-06-02 DIAGNOSIS — Q828 Other specified congenital malformations of skin: Secondary | ICD-10-CM

## 2015-06-03 DIAGNOSIS — H401122 Primary open-angle glaucoma, left eye, moderate stage: Secondary | ICD-10-CM | POA: Diagnosis not present

## 2015-06-03 DIAGNOSIS — H209 Unspecified iridocyclitis: Secondary | ICD-10-CM | POA: Diagnosis not present

## 2015-06-03 DIAGNOSIS — H401113 Primary open-angle glaucoma, right eye, severe stage: Secondary | ICD-10-CM | POA: Diagnosis not present

## 2015-06-03 NOTE — Progress Notes (Signed)
Patient ID: [name], male   DOB: [birth year], 69 y.o.   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

## 2015-06-29 ENCOUNTER — Other Ambulatory Visit: Payer: Self-pay | Admitting: Internal Medicine

## 2015-06-29 DIAGNOSIS — M1711 Unilateral primary osteoarthritis, right knee: Secondary | ICD-10-CM

## 2015-06-29 NOTE — Telephone Encounter (Signed)
Patient requesting a refill on his oxycodone.  °

## 2015-06-30 MED ORDER — OXYCODONE-ACETAMINOPHEN 10-325 MG PO TABS: 1.0000 | ORAL_TABLET | Freq: Four times a day (QID) | ORAL | Status: DC | PRN

## 2015-07-15 ENCOUNTER — Telehealth: Payer: Self-pay | Admitting: Internal Medicine

## 2015-07-15 NOTE — Telephone Encounter (Signed)
Call to patient to confirm appointment for 07/15/14 at 10:15 lmtcb

## 2015-07-16 ENCOUNTER — Ambulatory Visit (INDEPENDENT_AMBULATORY_CARE_PROVIDER_SITE_OTHER): Payer: Medicare Other | Admitting: Internal Medicine

## 2015-07-16 ENCOUNTER — Encounter: Payer: Self-pay | Admitting: Internal Medicine

## 2015-07-16 VITALS — BP 127/64 | HR 65 | Temp 97.0°F | Wt 284.8 lb

## 2015-07-16 DIAGNOSIS — K220 Achalasia of cardia: Secondary | ICD-10-CM | POA: Diagnosis not present

## 2015-07-16 DIAGNOSIS — K5903 Drug induced constipation: Secondary | ICD-10-CM | POA: Diagnosis not present

## 2015-07-16 DIAGNOSIS — E538 Deficiency of other specified B group vitamins: Secondary | ICD-10-CM | POA: Diagnosis not present

## 2015-07-16 DIAGNOSIS — T402X5A Adverse effect of other opioids, initial encounter: Secondary | ICD-10-CM

## 2015-07-16 DIAGNOSIS — I872 Venous insufficiency (chronic) (peripheral): Secondary | ICD-10-CM | POA: Diagnosis not present

## 2015-07-16 DIAGNOSIS — M65351 Trigger finger, right little finger: Secondary | ICD-10-CM | POA: Diagnosis not present

## 2015-07-16 DIAGNOSIS — H33011 Retinal detachment with single break, right eye: Secondary | ICD-10-CM | POA: Diagnosis not present

## 2015-07-16 DIAGNOSIS — I10 Essential (primary) hypertension: Secondary | ICD-10-CM

## 2015-07-16 DIAGNOSIS — T402X5D Adverse effect of other opioids, subsequent encounter: Secondary | ICD-10-CM

## 2015-07-16 DIAGNOSIS — M4806 Spinal stenosis, lumbar region: Secondary | ICD-10-CM

## 2015-07-16 DIAGNOSIS — H33001 Unspecified retinal detachment with retinal break, right eye: Secondary | ICD-10-CM

## 2015-07-16 DIAGNOSIS — M1711 Unilateral primary osteoarthritis, right knee: Secondary | ICD-10-CM | POA: Diagnosis not present

## 2015-07-16 DIAGNOSIS — M48062 Spinal stenosis, lumbar region with neurogenic claudication: Secondary | ICD-10-CM

## 2015-07-16 NOTE — Assessment & Plan Note (Signed)
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 He is tolerating this medication well without sedation.  Plan  We will continue the gabapentin at 600 mg by mouth 3 times daily and reassess for symptoms and side effects at the follow-up visit.

## 2015-07-16 NOTE — Assessment & Plan Note (Signed)
Assessment  His constipation due to the Percocet is only intermittent. He states he has not required the polyethylene glycol powder very often.  Plan  We will continue the as needed polyethylene glycol powder for constipation and reassess the degree of his symptoms at the follow-up visit.

## 2015-07-16 NOTE — Assessment & Plan Note (Signed)
Assessment  His chronic venous insufficiency is stable with 1+ pitting edema to the ankles bilaterally. This is while taking the lisinopril-hydrochlorothiazide 40-25 mg daily.  Plan  We will continue with the lisinopril-hydrochlorothiazide 40-25 mg by mouth daily and reassess the degree of his chronic venous insufficiency associated edema at the follow-up visit.

## 2015-07-16 NOTE — Assessment & Plan Note (Signed)
Assessment  He continues to have pain in his right knee which affects his quality of life. Fortunately it responds well symptomatically to Percocet. His other arthritic pains in the shoulder, hip, and hands responded well to as needed ibuprofen. He did not received any relief from the meloxicam he recently was prescribed.  Plan  We will continue the Percocet as it improves not only his quality of life but his ability to ambulate around his premises. We will also continue the as needed ibuprofen at 800 mg by mouth every 8 hours when necessary for other arthritic pain. We will reassess the efficacy of this regimen at the follow-up visit. He was provided 3 paper prescriptions for the Percocet which he is required to provide to the pharmacy when they deliver his medication on a monthly basis. As getting to clinic it is very difficult for him we feel this is appropriate to give him these prescriptions well in advance.

## 2015-07-16 NOTE — Assessment & Plan Note (Signed)
Assessment  We currently don't have the most recent eye progress notes in our record. He has outside eyedrops that do not reconcile with the eyedrops we currently have in the system. Although he knows he takes eyedrops he cannot provide me with the names at this time.  Plan  We will attempt to get the most recent ophthalmology progress note so that we may update our records with regards to this chronic problem.

## 2015-07-16 NOTE — Assessment & Plan Note (Signed)
Assessment  He has not been taking his vitamin B12 oral supplementation. 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  Plan  We will check a B12 level which is pending at the time of this dictation. If it is found to be low we will encourage him to take the vitamin B12 as prescribed and provide him with another prescription.

## 2015-07-16 NOTE — Progress Notes (Signed)
   Subjective:    Patient ID: [name], male    DOB: 12-Jan-1946, 70 y.o.   MRN: [id number]  HPI  [name] is here for follow-up of his achalasia symptoms, reflux, trigger finger, and knee osteoarthritis. Please see the A&P for the status of the pt's chronic medical problems.  Review of Systems  Constitutional: Negative for activity change, appetite change and unexpected weight change.  Respiratory: Positive for chest tightness and shortness of breath. Negative for wheezing.        Chest tightness believed to be related to esophageal spasm and reflux as rapidly relieved with PPI therapy and symptoms similar to previous difficulty with achalasia/GERD.  Cardiovascular: Positive for chest pain and leg swelling. Negative for palpitations.       LE edema is chronic venous insufficiency related swelling uncharged from baseline.  Gastrointestinal: Negative for nausea, vomiting, abdominal pain, diarrhea and constipation.  Musculoskeletal: Positive for back pain, joint swelling, arthralgias and gait problem.  Neurological: Negative for dizziness, syncope, weakness and light-headedness.      Objective:   Physical Exam  Constitutional: He is oriented to person, place, and time. He appears well-developed and well-nourished. No distress.  HENT:  Head: Normocephalic and atraumatic.  Eyes: Conjunctivae are normal. Right eye exhibits no discharge. Left eye exhibits no discharge. No scleral icterus.  Cardiovascular: Normal rate and regular rhythm.  Exam reveals no gallop and no friction rub.   Murmur heard. II/VI high pitched systolic murmur best heard over the apex and consistent in character to an MR murmur.  Pulmonary/Chest: Effort normal and breath sounds normal. No respiratory distress. He has no wheezes. He has no rales. He exhibits no tenderness.  Abdominal: Soft. Bowel sounds are normal. He exhibits no distension. There is no tenderness. There is no rebound and no guarding.    Musculoskeletal: Normal range of motion. He exhibits edema. He exhibits no tenderness.  Neurological: He is alert and oriented to person, place, and time. He exhibits normal muscle tone.  Skin: He is not diaphoretic.  Psychiatric: He has a normal mood and affect. His behavior is normal. Judgment and thought content normal.  Nursing note and vitals reviewed.     Assessment & Plan:   Please problem oriented charting.

## 2015-07-16 NOTE — Assessment & Plan Note (Signed)
Assessment  He is having occasional symptoms that sound like esophageal spasms or tightening which he attributes to reflux. These can be significantly uncomfortable but respond quickly to PPI therapy. In fact, sometimes these sharp pains resolved before he is able to get his PPI tablet. They are associated with shortness of breath and chest tightness but no nausea or dizziness. He is aware that he will require follow-up for his achalasia at some point but at this point he feels the symptoms are not yet severe enough to warrant re-referral to GI for further intervention.  Plan  We will continue to follow him symptomatically and if his symptoms worsen to the point that he would like intervention we will re-refer him to GI. In the interim we will continue with his PPI therapy.

## 2015-07-16 NOTE — Patient Instructions (Signed)
It was good to see you again.  You are doing a good job caring for yourself!  1) I drew blood today.  I will call you if there are any concerns.  2) I gave you 3 percocet prescriptions to hand to the pharmacy for your pain pills.  3) Keep taking your medications as you are.  4) Call me if you are having more pain in the chest from reflux and spasms.  I will see you in 4 months, sooner if necessary.

## 2015-07-16 NOTE — Assessment & Plan Note (Signed)
Assessment  After receiving a steroid injection in the A1 pulley of the right little finger his trigger finger symptoms have resolved. Unfortunately, he does have some occasional trigger fingering of the other digits in the right hand. At this point he does not feel it is problematic enough to warrant reassessment but he will let us know should he again have significant problems with trigger fingers in the other digits of the right hand.  Plan  We will continue to follow symptomatically and if the trigger finger of the right hand becomes worse symptomatically he will let us know and we will refer him back to sports medicine for an injection of steroid into the pulley mechanisms if appropriate.

## 2015-07-17 LAB — BMP8+ANION GAP
ANION GAP: 16 mmol/L (ref 10.0–18.0)
BUN/Creatinine Ratio: 13 (ref 10–22)
BUN: 13 mg/dL (ref 8–27)
CHLORIDE: 102 mmol/L (ref 96–106)
CO2: 25 mmol/L (ref 18–29)
CREATININE: 1.02 mg/dL (ref 0.76–1.27)
Calcium: 9.6 mg/dL (ref 8.6–10.2)
GFR calc Af Amer: 84 mL/min/{1.73_m2} (ref >59)
GFR calc non Af Amer: 73 mL/min/{1.73_m2} (ref >59)
GLUCOSE: 73 mg/dL (ref 65–99)
Potassium: 4.4 mmol/L (ref 3.5–5.2)
SODIUM: 143 mmol/L (ref 134–144)

## 2015-07-17 LAB — VITAMIN B12: VITAMIN B 12: 255 pg/mL (ref 211–946)

## 2015-07-19 MED ORDER — VITAMIN B-12 1000 MCG PO TABS: 1000.0000 ug | ORAL_TABLET | Freq: Every day | ORAL | Status: DC

## 2015-07-19 NOTE — Progress Notes (Signed)
BMP: K 4.4, BUN 13, Cr 1.02 Vitamin B12 255  B12 level has been falling since he stopped taking the vitamin B12 tablets.  I will ask him to restart them as prescribed.  I have renewed the prescription.  I called him and received an identified answering machine.  I left a message that his B12 level was low since stopping the medication, that I felt he needed to take it, and that I had renewed the prescription for him.  We will check the vitamin B12 level at the follow-up visit if he does take the medication as prescribed.

## 2015-07-19 NOTE — Addendum Note (Signed)
Addended by: [name] D on: 07/19/2015 03:18 PM   Modules accepted: Orders

## 2015-07-28 DIAGNOSIS — J45909 Unspecified asthma, uncomplicated: Secondary | ICD-10-CM | POA: Diagnosis not present

## 2015-07-28 DIAGNOSIS — M4806 Spinal stenosis, lumbar region: Secondary | ICD-10-CM | POA: Diagnosis not present

## 2015-07-28 DIAGNOSIS — I10 Essential (primary) hypertension: Secondary | ICD-10-CM | POA: Diagnosis not present

## 2015-07-29 DIAGNOSIS — J45909 Unspecified asthma, uncomplicated: Secondary | ICD-10-CM | POA: Diagnosis not present

## 2015-07-29 DIAGNOSIS — I10 Essential (primary) hypertension: Secondary | ICD-10-CM | POA: Diagnosis not present

## 2015-07-29 DIAGNOSIS — M4806 Spinal stenosis, lumbar region: Secondary | ICD-10-CM | POA: Diagnosis not present

## 2015-07-30 DIAGNOSIS — J45909 Unspecified asthma, uncomplicated: Secondary | ICD-10-CM | POA: Diagnosis not present

## 2015-07-30 DIAGNOSIS — I10 Essential (primary) hypertension: Secondary | ICD-10-CM | POA: Diagnosis not present

## 2015-07-30 DIAGNOSIS — M4806 Spinal stenosis, lumbar region: Secondary | ICD-10-CM | POA: Diagnosis not present

## 2015-08-02 DIAGNOSIS — M4806 Spinal stenosis, lumbar region: Secondary | ICD-10-CM | POA: Diagnosis not present

## 2015-08-02 DIAGNOSIS — J45909 Unspecified asthma, uncomplicated: Secondary | ICD-10-CM | POA: Diagnosis not present

## 2015-08-02 DIAGNOSIS — I10 Essential (primary) hypertension: Secondary | ICD-10-CM | POA: Diagnosis not present

## 2015-08-03 DIAGNOSIS — J45909 Unspecified asthma, uncomplicated: Secondary | ICD-10-CM | POA: Diagnosis not present

## 2015-08-03 DIAGNOSIS — M4806 Spinal stenosis, lumbar region: Secondary | ICD-10-CM | POA: Diagnosis not present

## 2015-08-03 DIAGNOSIS — I10 Essential (primary) hypertension: Secondary | ICD-10-CM | POA: Diagnosis not present

## 2015-08-04 DIAGNOSIS — I10 Essential (primary) hypertension: Secondary | ICD-10-CM | POA: Diagnosis not present

## 2015-08-04 DIAGNOSIS — M4806 Spinal stenosis, lumbar region: Secondary | ICD-10-CM | POA: Diagnosis not present

## 2015-08-04 DIAGNOSIS — J45909 Unspecified asthma, uncomplicated: Secondary | ICD-10-CM | POA: Diagnosis not present

## 2015-08-05 DIAGNOSIS — I10 Essential (primary) hypertension: Secondary | ICD-10-CM | POA: Diagnosis not present

## 2015-08-05 DIAGNOSIS — M4806 Spinal stenosis, lumbar region: Secondary | ICD-10-CM | POA: Diagnosis not present

## 2015-08-05 DIAGNOSIS — J45909 Unspecified asthma, uncomplicated: Secondary | ICD-10-CM | POA: Diagnosis not present

## 2015-08-06 DIAGNOSIS — J45909 Unspecified asthma, uncomplicated: Secondary | ICD-10-CM | POA: Diagnosis not present

## 2015-08-06 DIAGNOSIS — M4806 Spinal stenosis, lumbar region: Secondary | ICD-10-CM | POA: Diagnosis not present

## 2015-08-06 DIAGNOSIS — I10 Essential (primary) hypertension: Secondary | ICD-10-CM | POA: Diagnosis not present

## 2015-08-07 ENCOUNTER — Other Ambulatory Visit: Payer: Self-pay | Admitting: Internal Medicine

## 2015-08-09 DIAGNOSIS — J45909 Unspecified asthma, uncomplicated: Secondary | ICD-10-CM | POA: Diagnosis not present

## 2015-08-09 DIAGNOSIS — M4806 Spinal stenosis, lumbar region: Secondary | ICD-10-CM | POA: Diagnosis not present

## 2015-08-09 DIAGNOSIS — I10 Essential (primary) hypertension: Secondary | ICD-10-CM | POA: Diagnosis not present

## 2015-08-10 DIAGNOSIS — J45909 Unspecified asthma, uncomplicated: Secondary | ICD-10-CM | POA: Diagnosis not present

## 2015-08-10 DIAGNOSIS — I10 Essential (primary) hypertension: Secondary | ICD-10-CM | POA: Diagnosis not present

## 2015-08-10 DIAGNOSIS — M4806 Spinal stenosis, lumbar region: Secondary | ICD-10-CM | POA: Diagnosis not present

## 2015-08-10 NOTE — Telephone Encounter (Signed)
Was previously given paper prescriptions to get him through 10/10/2015.  Can request a refill prescription at that time.

## 2015-08-11 DIAGNOSIS — J45909 Unspecified asthma, uncomplicated: Secondary | ICD-10-CM | POA: Diagnosis not present

## 2015-08-11 DIAGNOSIS — I10 Essential (primary) hypertension: Secondary | ICD-10-CM | POA: Diagnosis not present

## 2015-08-11 DIAGNOSIS — M4806 Spinal stenosis, lumbar region: Secondary | ICD-10-CM | POA: Diagnosis not present

## 2015-08-12 DIAGNOSIS — M4806 Spinal stenosis, lumbar region: Secondary | ICD-10-CM | POA: Diagnosis not present

## 2015-08-12 DIAGNOSIS — J45909 Unspecified asthma, uncomplicated: Secondary | ICD-10-CM | POA: Diagnosis not present

## 2015-08-12 DIAGNOSIS — I10 Essential (primary) hypertension: Secondary | ICD-10-CM | POA: Diagnosis not present

## 2015-08-13 DIAGNOSIS — M4806 Spinal stenosis, lumbar region: Secondary | ICD-10-CM | POA: Diagnosis not present

## 2015-08-13 DIAGNOSIS — J45909 Unspecified asthma, uncomplicated: Secondary | ICD-10-CM | POA: Diagnosis not present

## 2015-08-13 DIAGNOSIS — I10 Essential (primary) hypertension: Secondary | ICD-10-CM | POA: Diagnosis not present

## 2015-08-16 DIAGNOSIS — M4806 Spinal stenosis, lumbar region: Secondary | ICD-10-CM | POA: Diagnosis not present

## 2015-08-16 DIAGNOSIS — J45909 Unspecified asthma, uncomplicated: Secondary | ICD-10-CM | POA: Diagnosis not present

## 2015-08-16 DIAGNOSIS — I10 Essential (primary) hypertension: Secondary | ICD-10-CM | POA: Diagnosis not present

## 2015-08-17 DIAGNOSIS — M4806 Spinal stenosis, lumbar region: Secondary | ICD-10-CM | POA: Diagnosis not present

## 2015-08-17 DIAGNOSIS — J45909 Unspecified asthma, uncomplicated: Secondary | ICD-10-CM | POA: Diagnosis not present

## 2015-08-17 DIAGNOSIS — I10 Essential (primary) hypertension: Secondary | ICD-10-CM | POA: Diagnosis not present

## 2015-08-18 DIAGNOSIS — J45909 Unspecified asthma, uncomplicated: Secondary | ICD-10-CM | POA: Diagnosis not present

## 2015-08-18 DIAGNOSIS — I10 Essential (primary) hypertension: Secondary | ICD-10-CM | POA: Diagnosis not present

## 2015-08-18 DIAGNOSIS — M4806 Spinal stenosis, lumbar region: Secondary | ICD-10-CM | POA: Diagnosis not present

## 2015-08-19 DIAGNOSIS — I10 Essential (primary) hypertension: Secondary | ICD-10-CM | POA: Diagnosis not present

## 2015-08-19 DIAGNOSIS — J45909 Unspecified asthma, uncomplicated: Secondary | ICD-10-CM | POA: Diagnosis not present

## 2015-08-19 DIAGNOSIS — M4806 Spinal stenosis, lumbar region: Secondary | ICD-10-CM | POA: Diagnosis not present

## 2015-08-20 DIAGNOSIS — I10 Essential (primary) hypertension: Secondary | ICD-10-CM | POA: Diagnosis not present

## 2015-08-20 DIAGNOSIS — M4806 Spinal stenosis, lumbar region: Secondary | ICD-10-CM | POA: Diagnosis not present

## 2015-08-20 DIAGNOSIS — J45909 Unspecified asthma, uncomplicated: Secondary | ICD-10-CM | POA: Diagnosis not present

## 2015-08-23 ENCOUNTER — Other Ambulatory Visit: Payer: Self-pay | Admitting: Internal Medicine

## 2015-08-23 DIAGNOSIS — M48062 Spinal stenosis, lumbar region with neurogenic claudication: Secondary | ICD-10-CM

## 2015-08-31 DIAGNOSIS — I10 Essential (primary) hypertension: Secondary | ICD-10-CM | POA: Diagnosis not present

## 2015-08-31 DIAGNOSIS — M4806 Spinal stenosis, lumbar region: Secondary | ICD-10-CM | POA: Diagnosis not present

## 2015-08-31 DIAGNOSIS — J45909 Unspecified asthma, uncomplicated: Secondary | ICD-10-CM | POA: Diagnosis not present

## 2015-09-01 ENCOUNTER — Encounter: Payer: Self-pay | Admitting: Podiatry

## 2015-09-01 ENCOUNTER — Ambulatory Visit (INDEPENDENT_AMBULATORY_CARE_PROVIDER_SITE_OTHER): Payer: Commercial Managed Care - HMO | Admitting: Podiatry

## 2015-09-01 DIAGNOSIS — I10 Essential (primary) hypertension: Secondary | ICD-10-CM | POA: Diagnosis not present

## 2015-09-01 DIAGNOSIS — M79676 Pain in unspecified toe(s): Secondary | ICD-10-CM

## 2015-09-01 DIAGNOSIS — Q828 Other specified congenital malformations of skin: Secondary | ICD-10-CM

## 2015-09-01 DIAGNOSIS — J45909 Unspecified asthma, uncomplicated: Secondary | ICD-10-CM | POA: Diagnosis not present

## 2015-09-01 DIAGNOSIS — B351 Tinea unguium: Secondary | ICD-10-CM | POA: Diagnosis not present

## 2015-09-01 DIAGNOSIS — M4806 Spinal stenosis, lumbar region: Secondary | ICD-10-CM | POA: Diagnosis not present

## 2015-09-02 DIAGNOSIS — I10 Essential (primary) hypertension: Secondary | ICD-10-CM | POA: Diagnosis not present

## 2015-09-02 DIAGNOSIS — J45909 Unspecified asthma, uncomplicated: Secondary | ICD-10-CM | POA: Diagnosis not present

## 2015-09-02 DIAGNOSIS — M4806 Spinal stenosis, lumbar region: Secondary | ICD-10-CM | POA: Diagnosis not present

## 2015-09-02 NOTE — Progress Notes (Signed)
Patient ID: [name], male   DOB: 06-Apr-1946, 70 y.o.   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

## 2015-09-06 DIAGNOSIS — I10 Essential (primary) hypertension: Secondary | ICD-10-CM | POA: Diagnosis not present

## 2015-09-06 DIAGNOSIS — J45909 Unspecified asthma, uncomplicated: Secondary | ICD-10-CM | POA: Diagnosis not present

## 2015-09-06 DIAGNOSIS — M4806 Spinal stenosis, lumbar region: Secondary | ICD-10-CM | POA: Diagnosis not present

## 2015-09-07 DIAGNOSIS — I10 Essential (primary) hypertension: Secondary | ICD-10-CM | POA: Diagnosis not present

## 2015-09-07 DIAGNOSIS — J45909 Unspecified asthma, uncomplicated: Secondary | ICD-10-CM | POA: Diagnosis not present

## 2015-09-07 DIAGNOSIS — M4806 Spinal stenosis, lumbar region: Secondary | ICD-10-CM | POA: Diagnosis not present

## 2015-09-08 DIAGNOSIS — J45909 Unspecified asthma, uncomplicated: Secondary | ICD-10-CM | POA: Diagnosis not present

## 2015-09-08 DIAGNOSIS — I10 Essential (primary) hypertension: Secondary | ICD-10-CM | POA: Diagnosis not present

## 2015-09-08 DIAGNOSIS — M4806 Spinal stenosis, lumbar region: Secondary | ICD-10-CM | POA: Diagnosis not present

## 2015-09-09 DIAGNOSIS — I10 Essential (primary) hypertension: Secondary | ICD-10-CM | POA: Diagnosis not present

## 2015-09-09 DIAGNOSIS — M4806 Spinal stenosis, lumbar region: Secondary | ICD-10-CM | POA: Diagnosis not present

## 2015-09-09 DIAGNOSIS — J45909 Unspecified asthma, uncomplicated: Secondary | ICD-10-CM | POA: Diagnosis not present

## 2015-09-10 DIAGNOSIS — I10 Essential (primary) hypertension: Secondary | ICD-10-CM | POA: Diagnosis not present

## 2015-09-10 DIAGNOSIS — M4806 Spinal stenosis, lumbar region: Secondary | ICD-10-CM | POA: Diagnosis not present

## 2015-09-10 DIAGNOSIS — J45909 Unspecified asthma, uncomplicated: Secondary | ICD-10-CM | POA: Diagnosis not present

## 2015-09-13 DIAGNOSIS — J45909 Unspecified asthma, uncomplicated: Secondary | ICD-10-CM | POA: Diagnosis not present

## 2015-09-13 DIAGNOSIS — M4806 Spinal stenosis, lumbar region: Secondary | ICD-10-CM | POA: Diagnosis not present

## 2015-09-13 DIAGNOSIS — I10 Essential (primary) hypertension: Secondary | ICD-10-CM | POA: Diagnosis not present

## 2015-09-14 DIAGNOSIS — J45909 Unspecified asthma, uncomplicated: Secondary | ICD-10-CM | POA: Diagnosis not present

## 2015-09-14 DIAGNOSIS — M4806 Spinal stenosis, lumbar region: Secondary | ICD-10-CM | POA: Diagnosis not present

## 2015-09-14 DIAGNOSIS — I10 Essential (primary) hypertension: Secondary | ICD-10-CM | POA: Diagnosis not present

## 2015-09-15 DIAGNOSIS — M4806 Spinal stenosis, lumbar region: Secondary | ICD-10-CM | POA: Diagnosis not present

## 2015-09-15 DIAGNOSIS — J45909 Unspecified asthma, uncomplicated: Secondary | ICD-10-CM | POA: Diagnosis not present

## 2015-09-15 DIAGNOSIS — I10 Essential (primary) hypertension: Secondary | ICD-10-CM | POA: Diagnosis not present

## 2015-09-16 DIAGNOSIS — M4806 Spinal stenosis, lumbar region: Secondary | ICD-10-CM | POA: Diagnosis not present

## 2015-09-16 DIAGNOSIS — J45909 Unspecified asthma, uncomplicated: Secondary | ICD-10-CM | POA: Diagnosis not present

## 2015-09-16 DIAGNOSIS — I10 Essential (primary) hypertension: Secondary | ICD-10-CM | POA: Diagnosis not present

## 2015-09-17 DIAGNOSIS — I10 Essential (primary) hypertension: Secondary | ICD-10-CM | POA: Diagnosis not present

## 2015-09-17 DIAGNOSIS — M4806 Spinal stenosis, lumbar region: Secondary | ICD-10-CM | POA: Diagnosis not present

## 2015-09-17 DIAGNOSIS — J45909 Unspecified asthma, uncomplicated: Secondary | ICD-10-CM | POA: Diagnosis not present

## 2015-09-20 DIAGNOSIS — M4806 Spinal stenosis, lumbar region: Secondary | ICD-10-CM | POA: Diagnosis not present

## 2015-09-20 DIAGNOSIS — J45909 Unspecified asthma, uncomplicated: Secondary | ICD-10-CM | POA: Diagnosis not present

## 2015-09-20 DIAGNOSIS — I10 Essential (primary) hypertension: Secondary | ICD-10-CM | POA: Diagnosis not present

## 2015-09-21 DIAGNOSIS — J45909 Unspecified asthma, uncomplicated: Secondary | ICD-10-CM | POA: Diagnosis not present

## 2015-09-21 DIAGNOSIS — I10 Essential (primary) hypertension: Secondary | ICD-10-CM | POA: Diagnosis not present

## 2015-09-21 DIAGNOSIS — M4806 Spinal stenosis, lumbar region: Secondary | ICD-10-CM | POA: Diagnosis not present

## 2015-09-22 DIAGNOSIS — I10 Essential (primary) hypertension: Secondary | ICD-10-CM | POA: Diagnosis not present

## 2015-09-22 DIAGNOSIS — J45909 Unspecified asthma, uncomplicated: Secondary | ICD-10-CM | POA: Diagnosis not present

## 2015-09-22 DIAGNOSIS — M4806 Spinal stenosis, lumbar region: Secondary | ICD-10-CM | POA: Diagnosis not present

## 2015-09-23 DIAGNOSIS — J45909 Unspecified asthma, uncomplicated: Secondary | ICD-10-CM | POA: Diagnosis not present

## 2015-09-23 DIAGNOSIS — M4806 Spinal stenosis, lumbar region: Secondary | ICD-10-CM | POA: Diagnosis not present

## 2015-09-23 DIAGNOSIS — I10 Essential (primary) hypertension: Secondary | ICD-10-CM | POA: Diagnosis not present

## 2015-09-27 DIAGNOSIS — M4806 Spinal stenosis, lumbar region: Secondary | ICD-10-CM | POA: Diagnosis not present

## 2015-09-27 DIAGNOSIS — J45909 Unspecified asthma, uncomplicated: Secondary | ICD-10-CM | POA: Diagnosis not present

## 2015-09-27 DIAGNOSIS — I10 Essential (primary) hypertension: Secondary | ICD-10-CM | POA: Diagnosis not present

## 2015-09-28 DIAGNOSIS — I10 Essential (primary) hypertension: Secondary | ICD-10-CM | POA: Diagnosis not present

## 2015-09-28 DIAGNOSIS — M4806 Spinal stenosis, lumbar region: Secondary | ICD-10-CM | POA: Diagnosis not present

## 2015-09-28 DIAGNOSIS — J45909 Unspecified asthma, uncomplicated: Secondary | ICD-10-CM | POA: Diagnosis not present

## 2015-09-29 DIAGNOSIS — I10 Essential (primary) hypertension: Secondary | ICD-10-CM | POA: Diagnosis not present

## 2015-09-29 DIAGNOSIS — J45909 Unspecified asthma, uncomplicated: Secondary | ICD-10-CM | POA: Diagnosis not present

## 2015-09-29 DIAGNOSIS — M4806 Spinal stenosis, lumbar region: Secondary | ICD-10-CM | POA: Diagnosis not present

## 2015-09-30 DIAGNOSIS — I10 Essential (primary) hypertension: Secondary | ICD-10-CM | POA: Diagnosis not present

## 2015-09-30 DIAGNOSIS — J45909 Unspecified asthma, uncomplicated: Secondary | ICD-10-CM | POA: Diagnosis not present

## 2015-09-30 DIAGNOSIS — M4806 Spinal stenosis, lumbar region: Secondary | ICD-10-CM | POA: Diagnosis not present

## 2015-10-01 DIAGNOSIS — I10 Essential (primary) hypertension: Secondary | ICD-10-CM | POA: Diagnosis not present

## 2015-10-01 DIAGNOSIS — M4806 Spinal stenosis, lumbar region: Secondary | ICD-10-CM | POA: Diagnosis not present

## 2015-10-01 DIAGNOSIS — J45909 Unspecified asthma, uncomplicated: Secondary | ICD-10-CM | POA: Diagnosis not present

## 2015-10-04 DIAGNOSIS — M4806 Spinal stenosis, lumbar region: Secondary | ICD-10-CM | POA: Diagnosis not present

## 2015-10-04 DIAGNOSIS — J45909 Unspecified asthma, uncomplicated: Secondary | ICD-10-CM | POA: Diagnosis not present

## 2015-10-04 DIAGNOSIS — I10 Essential (primary) hypertension: Secondary | ICD-10-CM | POA: Diagnosis not present

## 2015-10-05 DIAGNOSIS — J45909 Unspecified asthma, uncomplicated: Secondary | ICD-10-CM | POA: Diagnosis not present

## 2015-10-05 DIAGNOSIS — M4806 Spinal stenosis, lumbar region: Secondary | ICD-10-CM | POA: Diagnosis not present

## 2015-10-05 DIAGNOSIS — I10 Essential (primary) hypertension: Secondary | ICD-10-CM | POA: Diagnosis not present

## 2015-10-06 DIAGNOSIS — M4806 Spinal stenosis, lumbar region: Secondary | ICD-10-CM | POA: Diagnosis not present

## 2015-10-06 DIAGNOSIS — J45909 Unspecified asthma, uncomplicated: Secondary | ICD-10-CM | POA: Diagnosis not present

## 2015-10-06 DIAGNOSIS — I10 Essential (primary) hypertension: Secondary | ICD-10-CM | POA: Diagnosis not present

## 2015-10-07 DIAGNOSIS — J45909 Unspecified asthma, uncomplicated: Secondary | ICD-10-CM | POA: Diagnosis not present

## 2015-10-07 DIAGNOSIS — I10 Essential (primary) hypertension: Secondary | ICD-10-CM | POA: Diagnosis not present

## 2015-10-07 DIAGNOSIS — M4806 Spinal stenosis, lumbar region: Secondary | ICD-10-CM | POA: Diagnosis not present

## 2015-10-08 DIAGNOSIS — J45909 Unspecified asthma, uncomplicated: Secondary | ICD-10-CM | POA: Diagnosis not present

## 2015-10-08 DIAGNOSIS — M4806 Spinal stenosis, lumbar region: Secondary | ICD-10-CM | POA: Diagnosis not present

## 2015-10-08 DIAGNOSIS — I10 Essential (primary) hypertension: Secondary | ICD-10-CM | POA: Diagnosis not present

## 2015-10-11 ENCOUNTER — Other Ambulatory Visit: Payer: Self-pay | Admitting: Internal Medicine

## 2015-10-11 DIAGNOSIS — I10 Essential (primary) hypertension: Secondary | ICD-10-CM | POA: Diagnosis not present

## 2015-10-11 DIAGNOSIS — M4806 Spinal stenosis, lumbar region: Secondary | ICD-10-CM | POA: Diagnosis not present

## 2015-10-11 DIAGNOSIS — J45909 Unspecified asthma, uncomplicated: Secondary | ICD-10-CM | POA: Diagnosis not present

## 2015-10-11 DIAGNOSIS — M1711 Unilateral primary osteoarthritis, right knee: Secondary | ICD-10-CM

## 2015-10-11 MED ORDER — OXYCODONE-ACETAMINOPHEN 10-325 MG PO TABS: 1.0000 | ORAL_TABLET | Freq: Four times a day (QID) | ORAL | Status: DC | PRN

## 2015-10-11 NOTE — Telephone Encounter (Signed)
Pain med refill °

## 2015-10-12 DIAGNOSIS — I10 Essential (primary) hypertension: Secondary | ICD-10-CM | POA: Diagnosis not present

## 2015-10-12 DIAGNOSIS — M4806 Spinal stenosis, lumbar region: Secondary | ICD-10-CM | POA: Diagnosis not present

## 2015-10-12 DIAGNOSIS — J45909 Unspecified asthma, uncomplicated: Secondary | ICD-10-CM | POA: Diagnosis not present

## 2015-10-13 DIAGNOSIS — M4806 Spinal stenosis, lumbar region: Secondary | ICD-10-CM | POA: Diagnosis not present

## 2015-10-13 DIAGNOSIS — J45909 Unspecified asthma, uncomplicated: Secondary | ICD-10-CM | POA: Diagnosis not present

## 2015-10-13 DIAGNOSIS — I10 Essential (primary) hypertension: Secondary | ICD-10-CM | POA: Diagnosis not present

## 2015-10-14 DIAGNOSIS — J45909 Unspecified asthma, uncomplicated: Secondary | ICD-10-CM | POA: Diagnosis not present

## 2015-10-14 DIAGNOSIS — I10 Essential (primary) hypertension: Secondary | ICD-10-CM | POA: Diagnosis not present

## 2015-10-14 DIAGNOSIS — M4806 Spinal stenosis, lumbar region: Secondary | ICD-10-CM | POA: Diagnosis not present

## 2015-10-15 DIAGNOSIS — I10 Essential (primary) hypertension: Secondary | ICD-10-CM | POA: Diagnosis not present

## 2015-10-15 DIAGNOSIS — M4806 Spinal stenosis, lumbar region: Secondary | ICD-10-CM | POA: Diagnosis not present

## 2015-10-15 DIAGNOSIS — J45909 Unspecified asthma, uncomplicated: Secondary | ICD-10-CM | POA: Diagnosis not present

## 2015-10-18 ENCOUNTER — Telehealth: Payer: Self-pay

## 2015-10-18 ENCOUNTER — Telehealth: Payer: Self-pay | Admitting: Licensed Clinical Social Worker

## 2015-10-18 DIAGNOSIS — J45909 Unspecified asthma, uncomplicated: Secondary | ICD-10-CM | POA: Diagnosis not present

## 2015-10-18 DIAGNOSIS — M4806 Spinal stenosis, lumbar region: Secondary | ICD-10-CM | POA: Diagnosis not present

## 2015-10-18 DIAGNOSIS — I10 Essential (primary) hypertension: Secondary | ICD-10-CM | POA: Diagnosis not present

## 2015-10-18 NOTE — Telephone Encounter (Signed)
CSW received copy of pt's request appealing his PCS decision and Part B of SCAT application.  CSW placed call to Mr. Stika to obtain add'l information from Mr. Smoker in regards to the Appeal form.  Pt deferred and inquired if this worker would like to speak with Ms. Mirna Mires, who provides assistance for him.  Ms. Mirna Mires states pt has completed and faxed Part A of the SCAT redetermination application.  Ms. Mirna Mires continues stating Mr. Matison was requesting PCP to write a letter on his behalf to provide during his appeal for his PCS hour not to be reduced.  CSW explained to Ms. Marlin, physicians generally are not informed of the hours recipients receive, as the amount of hours is determined by a third party.  However, CSW will forward form to PCP with pt's request.

## 2015-10-18 NOTE — Telephone Encounter (Signed)
Pt requesting the nurse to call back. If pt does not pick up the phone, call pt caseworker at [phone].

## 2015-10-18 NOTE — Telephone Encounter (Signed)
Pt was checking status of pain medicine refill- advised they have been approved

## 2015-10-19 ENCOUNTER — Encounter: Payer: Self-pay | Admitting: Internal Medicine

## 2015-10-19 DIAGNOSIS — I10 Essential (primary) hypertension: Secondary | ICD-10-CM | POA: Diagnosis not present

## 2015-10-19 DIAGNOSIS — J45909 Unspecified asthma, uncomplicated: Secondary | ICD-10-CM | POA: Diagnosis not present

## 2015-10-19 DIAGNOSIS — M4806 Spinal stenosis, lumbar region: Secondary | ICD-10-CM | POA: Diagnosis not present

## 2015-10-20 DIAGNOSIS — I10 Essential (primary) hypertension: Secondary | ICD-10-CM | POA: Diagnosis not present

## 2015-10-20 DIAGNOSIS — J45909 Unspecified asthma, uncomplicated: Secondary | ICD-10-CM | POA: Diagnosis not present

## 2015-10-20 DIAGNOSIS — M4806 Spinal stenosis, lumbar region: Secondary | ICD-10-CM | POA: Diagnosis not present

## 2015-10-21 ENCOUNTER — Other Ambulatory Visit: Payer: Self-pay | Admitting: Internal Medicine

## 2015-10-21 DIAGNOSIS — I10 Essential (primary) hypertension: Secondary | ICD-10-CM | POA: Diagnosis not present

## 2015-10-21 DIAGNOSIS — M4806 Spinal stenosis, lumbar region: Secondary | ICD-10-CM | POA: Diagnosis not present

## 2015-10-21 DIAGNOSIS — J45909 Unspecified asthma, uncomplicated: Secondary | ICD-10-CM | POA: Diagnosis not present

## 2015-10-21 NOTE — Telephone Encounter (Signed)
Refill request from pt's pharmacy-medication in no longer on pt's medication list-will send to pcp for review.  Please advise.[name], Shawn Shankman Cassady4/27/20171:04 PM

## 2015-10-21 NOTE — Telephone Encounter (Signed)
Medication discontinued because ineffective.  I addressed the request.  Thanks.

## 2015-10-22 DIAGNOSIS — J45909 Unspecified asthma, uncomplicated: Secondary | ICD-10-CM | POA: Diagnosis not present

## 2015-10-22 DIAGNOSIS — M4806 Spinal stenosis, lumbar region: Secondary | ICD-10-CM | POA: Diagnosis not present

## 2015-10-22 DIAGNOSIS — I10 Essential (primary) hypertension: Secondary | ICD-10-CM | POA: Diagnosis not present

## 2015-10-26 DIAGNOSIS — J45909 Unspecified asthma, uncomplicated: Secondary | ICD-10-CM | POA: Diagnosis not present

## 2015-10-26 DIAGNOSIS — M4806 Spinal stenosis, lumbar region: Secondary | ICD-10-CM | POA: Diagnosis not present

## 2015-10-26 DIAGNOSIS — I10 Essential (primary) hypertension: Secondary | ICD-10-CM | POA: Diagnosis not present

## 2015-10-27 DIAGNOSIS — J45909 Unspecified asthma, uncomplicated: Secondary | ICD-10-CM | POA: Diagnosis not present

## 2015-10-27 DIAGNOSIS — I10 Essential (primary) hypertension: Secondary | ICD-10-CM | POA: Diagnosis not present

## 2015-10-27 DIAGNOSIS — M4806 Spinal stenosis, lumbar region: Secondary | ICD-10-CM | POA: Diagnosis not present

## 2015-10-28 DIAGNOSIS — I10 Essential (primary) hypertension: Secondary | ICD-10-CM | POA: Diagnosis not present

## 2015-10-28 DIAGNOSIS — J45909 Unspecified asthma, uncomplicated: Secondary | ICD-10-CM | POA: Diagnosis not present

## 2015-10-28 DIAGNOSIS — M4806 Spinal stenosis, lumbar region: Secondary | ICD-10-CM | POA: Diagnosis not present

## 2015-10-29 DIAGNOSIS — J45909 Unspecified asthma, uncomplicated: Secondary | ICD-10-CM | POA: Diagnosis not present

## 2015-10-29 DIAGNOSIS — M4806 Spinal stenosis, lumbar region: Secondary | ICD-10-CM | POA: Diagnosis not present

## 2015-10-29 DIAGNOSIS — I10 Essential (primary) hypertension: Secondary | ICD-10-CM | POA: Diagnosis not present

## 2015-11-01 DIAGNOSIS — J45909 Unspecified asthma, uncomplicated: Secondary | ICD-10-CM | POA: Diagnosis not present

## 2015-11-01 DIAGNOSIS — M4806 Spinal stenosis, lumbar region: Secondary | ICD-10-CM | POA: Diagnosis not present

## 2015-11-01 DIAGNOSIS — I10 Essential (primary) hypertension: Secondary | ICD-10-CM | POA: Diagnosis not present

## 2015-11-03 DIAGNOSIS — I10 Essential (primary) hypertension: Secondary | ICD-10-CM | POA: Diagnosis not present

## 2015-11-03 DIAGNOSIS — M4806 Spinal stenosis, lumbar region: Secondary | ICD-10-CM | POA: Diagnosis not present

## 2015-11-03 DIAGNOSIS — J45909 Unspecified asthma, uncomplicated: Secondary | ICD-10-CM | POA: Diagnosis not present

## 2015-11-04 DIAGNOSIS — M4806 Spinal stenosis, lumbar region: Secondary | ICD-10-CM | POA: Diagnosis not present

## 2015-11-04 DIAGNOSIS — J45909 Unspecified asthma, uncomplicated: Secondary | ICD-10-CM | POA: Diagnosis not present

## 2015-11-04 DIAGNOSIS — I10 Essential (primary) hypertension: Secondary | ICD-10-CM | POA: Diagnosis not present

## 2015-11-05 DIAGNOSIS — I10 Essential (primary) hypertension: Secondary | ICD-10-CM | POA: Diagnosis not present

## 2015-11-05 DIAGNOSIS — M4806 Spinal stenosis, lumbar region: Secondary | ICD-10-CM | POA: Diagnosis not present

## 2015-11-05 DIAGNOSIS — J45909 Unspecified asthma, uncomplicated: Secondary | ICD-10-CM | POA: Diagnosis not present

## 2015-11-08 DIAGNOSIS — J45909 Unspecified asthma, uncomplicated: Secondary | ICD-10-CM | POA: Diagnosis not present

## 2015-11-08 DIAGNOSIS — I10 Essential (primary) hypertension: Secondary | ICD-10-CM | POA: Diagnosis not present

## 2015-11-08 DIAGNOSIS — M4806 Spinal stenosis, lumbar region: Secondary | ICD-10-CM | POA: Diagnosis not present

## 2015-11-09 DIAGNOSIS — J45909 Unspecified asthma, uncomplicated: Secondary | ICD-10-CM | POA: Diagnosis not present

## 2015-11-09 DIAGNOSIS — M4806 Spinal stenosis, lumbar region: Secondary | ICD-10-CM | POA: Diagnosis not present

## 2015-11-09 DIAGNOSIS — I10 Essential (primary) hypertension: Secondary | ICD-10-CM | POA: Diagnosis not present

## 2015-11-10 DIAGNOSIS — I10 Essential (primary) hypertension: Secondary | ICD-10-CM | POA: Diagnosis not present

## 2015-11-10 DIAGNOSIS — M4806 Spinal stenosis, lumbar region: Secondary | ICD-10-CM | POA: Diagnosis not present

## 2015-11-10 DIAGNOSIS — J45909 Unspecified asthma, uncomplicated: Secondary | ICD-10-CM | POA: Diagnosis not present

## 2015-11-11 DIAGNOSIS — I10 Essential (primary) hypertension: Secondary | ICD-10-CM | POA: Diagnosis not present

## 2015-11-11 DIAGNOSIS — M4806 Spinal stenosis, lumbar region: Secondary | ICD-10-CM | POA: Diagnosis not present

## 2015-11-11 DIAGNOSIS — J45909 Unspecified asthma, uncomplicated: Secondary | ICD-10-CM | POA: Diagnosis not present

## 2015-11-12 ENCOUNTER — Ambulatory Visit (INDEPENDENT_AMBULATORY_CARE_PROVIDER_SITE_OTHER): Payer: Commercial Managed Care - HMO | Admitting: Internal Medicine

## 2015-11-12 ENCOUNTER — Encounter: Payer: Self-pay | Admitting: Internal Medicine

## 2015-11-12 VITALS — BP 137/72 | HR 59 | Temp 98.2°F | Wt 288.9 lb

## 2015-11-12 DIAGNOSIS — M65351 Trigger finger, right little finger: Secondary | ICD-10-CM

## 2015-11-12 DIAGNOSIS — M48062 Spinal stenosis, lumbar region with neurogenic claudication: Secondary | ICD-10-CM

## 2015-11-12 DIAGNOSIS — K5903 Drug induced constipation: Secondary | ICD-10-CM

## 2015-11-12 DIAGNOSIS — M19012 Primary osteoarthritis, left shoulder: Secondary | ICD-10-CM

## 2015-11-12 DIAGNOSIS — I872 Venous insufficiency (chronic) (peripheral): Secondary | ICD-10-CM

## 2015-11-12 DIAGNOSIS — I10 Essential (primary) hypertension: Secondary | ICD-10-CM | POA: Diagnosis not present

## 2015-11-12 DIAGNOSIS — M479 Spondylosis, unspecified: Secondary | ICD-10-CM

## 2015-11-12 DIAGNOSIS — B351 Tinea unguium: Secondary | ICD-10-CM

## 2015-11-12 DIAGNOSIS — M1711 Unilateral primary osteoarthritis, right knee: Secondary | ICD-10-CM

## 2015-11-12 DIAGNOSIS — M170 Bilateral primary osteoarthritis of knee: Secondary | ICD-10-CM | POA: Diagnosis not present

## 2015-11-12 DIAGNOSIS — K220 Achalasia of cardia: Secondary | ICD-10-CM

## 2015-11-12 DIAGNOSIS — J45909 Unspecified asthma, uncomplicated: Secondary | ICD-10-CM | POA: Diagnosis not present

## 2015-11-12 DIAGNOSIS — T402X5D Adverse effect of other opioids, subsequent encounter: Secondary | ICD-10-CM

## 2015-11-12 DIAGNOSIS — M4806 Spinal stenosis, lumbar region: Secondary | ICD-10-CM

## 2015-11-12 DIAGNOSIS — R269 Unspecified abnormalities of gait and mobility: Secondary | ICD-10-CM | POA: Diagnosis not present

## 2015-11-12 DIAGNOSIS — K219 Gastro-esophageal reflux disease without esophagitis: Secondary | ICD-10-CM

## 2015-11-12 DIAGNOSIS — T402X5A Adverse effect of other opioids, initial encounter: Secondary | ICD-10-CM

## 2015-11-12 DIAGNOSIS — M1611 Unilateral primary osteoarthritis, right hip: Secondary | ICD-10-CM

## 2015-11-12 DIAGNOSIS — E538 Deficiency of other specified B group vitamins: Secondary | ICD-10-CM

## 2015-11-12 MED ORDER — SORBITOL 70 % PO SOLN
15.0000 mL | Freq: Every day | ORAL | Status: DC | PRN
Start: 2015-11-12 — End: 2016-05-26

## 2015-11-12 MED ORDER — OXYCODONE-ACETAMINOPHEN 10-325 MG PO TABS: 1.0000 | ORAL_TABLET | Freq: Four times a day (QID) | ORAL | Status: DC | PRN

## 2015-11-12 NOTE — Progress Notes (Signed)
   Subjective:    Patient ID: [name], male    DOB: [birth year], 70 y.o.   MRN: [id number]  HPI  [name] is here for follow-up of his hypertension, arthritis, chronic venous insufficiency, and achalasia. Please see the A&P for the status of the pt's chronic medical problems.  Review of Systems  Constitutional: Negative for activity change and appetite change.  Respiratory: Negative for cough, choking, chest tightness, shortness of breath, wheezing and stridor.   Cardiovascular: Positive for leg swelling. Negative for chest pain and palpitations.  Gastrointestinal: Positive for constipation. Negative for nausea, vomiting, abdominal pain, diarrhea and abdominal distention.  Musculoskeletal: Positive for back pain, joint swelling, arthralgias and gait problem. Negative for myalgias.  Skin: Positive for wound. Negative for color change and rash.  Neurological: Negative for dizziness, syncope and weakness.      Objective:   Physical Exam  Constitutional: He is oriented to person, place, and time. He appears well-developed and well-nourished. No distress.  HENT:  Head: Normocephalic and atraumatic.  Eyes: Conjunctivae are normal. Right eye exhibits no discharge. Left eye exhibits no discharge. No scleral icterus.  Cardiovascular: Normal rate and regular rhythm.  Exam reveals no gallop and no friction rub.   Murmur heard. Systolic murmur best heard in the LLSB  Pulmonary/Chest: Effort normal and breath sounds normal. No respiratory distress. He has no wheezes. He has no rales.  Abdominal: Soft. Bowel sounds are normal. He exhibits no distension. There is no tenderness. There is no rebound and no guarding.  Musculoskeletal: Normal range of motion. He exhibits edema. He exhibits no tenderness.  Bilateral symmetric edema slight worse than usual, Abrasions over right knee from recent fall  Neurological: He is alert and oriented to person, place, and time. He exhibits normal  muscle tone.  Skin: Skin is warm and dry. No rash noted. He is not diaphoretic. No erythema.  Psychiatric: He has a normal mood and affect. His behavior is normal. Judgment and thought content normal.  Nursing note and vitals reviewed.     Assessment & Plan:   Please see problem oriented charting.

## 2015-11-12 NOTE — Assessment & Plan Note (Signed)
Assessment  His lower extremity edema is slightly worse than usual but is felt to be due to his chronic venous insufficiency. At this point he has no worrisome skin changes.  Plan  We will continue to monitor the lower extremity edema as well as continue the lisinopril-hydrochlorothiazide 40-25 mg by mouth daily. We will reassess his lower extremity edema at the follow-up visit.

## 2015-11-12 NOTE — Patient Instructions (Signed)
It was good to see you again.  I am sorry about your fall.  1) Keep taking your medications as you are.  2) I started sorbitol 1-5 tablespoons in your morning coffee or juice.  Start at 1 tablespoon and adjust it up to 5 tablespoons so your bowels are moving like you want them to.  3) I increased your percocet to 75 tablets a month to match what you are taking.  I gave you a prescription for July to start the 75/month.  You already have may and June's prescriptions at home.  4) Let me know when your swallowing worsens enough that you need to have your esophagus dilated again.  I will see you in 3 months, sooner if necessary.

## 2015-11-12 NOTE — Assessment & Plan Note (Signed)
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 This is reasonably well controlled with MiraLAX but he is not completely satisfied with this therapy because he does not have his bowel movements soon after taking the MiraLAX. He is interested in other therapy that may be more stimulative soon after taking it. I'm hesitant to give him chronic stimulants to avoid the development of melanosis coli.  Plan  He is to continue with the MiraLAX and we will start sorbitol 70% 1-5 tablespoons as needed in his morning coffee or juice adjusted to his desired frequency of bowel movements. We will reassess his satisfaction with his bowel regimen at the follow-up visit.

## 2015-11-12 NOTE — Assessment & Plan Note (Signed)
Assessment  He has chronic osteoarthritis of the knees that make ambulation difficult. Nonetheless, he does fairly well with a cane. Approximately one week ago, when getting up out of a chair, he felt his legs may have been asleep and his knees gave out when trying to take a step. He fell to the ground with a resultant abrasion to his right knee. He has not had any worsening pain over baseline since this episode, nor has he had any other falls. He specifically denies passing out.  Plan  His osteoarthritis continues to be problematic with regards to his gait. This episode sounds mechanical in nature. We will continue to monitor for any falls and he will continue to use his cane to get around.

## 2015-11-12 NOTE — Assessment & Plan Note (Signed)
Assessment  He states he has been taking his oral vitamin B tablets.  Plan  Since I was not drawing other blood today I decided against obtaining a vitamin B level at this time. When he is due for other labs I will also include a vitamin B12 level to make sure we are appropriately supplementing.

## 2015-11-12 NOTE — Assessment & Plan Note (Signed)
Assessment  His blood pressure is well controlled today at 137/72. This is on amlodipine 10 mg by mouth daily, and lisinopril-hydrochlorothiazide 40-25 mg by mouth daily.  Plan  We will continue the amlodipine and the lisinopril-hydrochlorothiazide at the current doses. We will reassess the blood pressure control at the follow-up visit.

## 2015-11-12 NOTE — Assessment & Plan Note (Signed)
Assessment  His neuropathic symptoms are well controlled on the gabapentin 600 mg by mouth 3 times daily.  Plan  We will continue the gabapentin at 600 mg by mouth 3 times daily and reassess his neuropathic symptoms at the follow-up visit.

## 2015-11-12 NOTE — Assessment & Plan Note (Signed)
Assessment  He was recently seen by the podiatrist for his onychomycoses of his toes.  Plan  At that visit he received intervention by the podiatrist and will continue with periodic follow-up to manage his toenails.

## 2015-11-12 NOTE — Assessment & Plan Note (Signed)
Assessment  He continues to have osteoarthritic pain of the right knee, right hip, left shoulder, and back. This responds well to as needed ibuprofen 800 mg by mouth every 8 hours as needed and Percocet 10-325 mg 2-3 tablets daily as needed. His current prescriptions of Percocet are only for 60 a month and will occasionally fall short of his needs.  Plan  We have increased the Percocet to 75 tablets per month to begin in July. He already has 2 hard prescriptions at home for 60 mg per month but was provided a 75 mg per month prescription not to be filled before July. He was satisfied with this arrangement. He will also continue the as needed ibuprofen at 800 mg by mouth 3 times daily. We will reassess his arthritic pains at the follow-up visit on this regimen.

## 2015-11-12 NOTE — Assessment & Plan Note (Signed)
Other than the Zostavax, which has been prohibitively expensive, he is up-to-date on his health care maintenance.

## 2015-11-12 NOTE — Assessment & Plan Note (Signed)
Assessment  He notes slightly worsening difficulty with eating, specifically swallowing and food sticking on a very rare occasion. Again, he does not feel this is severe enough to necessitate reevaluation by GI.  Plan  We will continue to reassess for symptoms of worsening achalasia at the follow-up visit. He was asked to call the clinic if the symptoms got to the point that he was ready for further GI reevaluation and likely intervention.

## 2015-11-12 NOTE — Assessment & Plan Note (Signed)
Assessment  His trigger finger symptoms have resolved after the injection he received and sports medicine.  Plan  We will continue to reassess for recurrence of his trigger finger at subsequent visits.

## 2015-11-12 NOTE — Assessment & Plan Note (Signed)
Assessment  His acid reflux symptoms are reasonably well controlled on the omeprazole 20 mg by mouth daily.  Plan  We will continue the omeprazole 20 mg by mouth daily and reassess for symptoms of reflux at the follow-up visit.

## 2015-11-15 DIAGNOSIS — J45909 Unspecified asthma, uncomplicated: Secondary | ICD-10-CM | POA: Diagnosis not present

## 2015-11-15 DIAGNOSIS — I10 Essential (primary) hypertension: Secondary | ICD-10-CM | POA: Diagnosis not present

## 2015-11-15 DIAGNOSIS — M4806 Spinal stenosis, lumbar region: Secondary | ICD-10-CM | POA: Diagnosis not present

## 2015-11-16 DIAGNOSIS — I10 Essential (primary) hypertension: Secondary | ICD-10-CM | POA: Diagnosis not present

## 2015-11-16 DIAGNOSIS — J45909 Unspecified asthma, uncomplicated: Secondary | ICD-10-CM | POA: Diagnosis not present

## 2015-11-16 DIAGNOSIS — M4806 Spinal stenosis, lumbar region: Secondary | ICD-10-CM | POA: Diagnosis not present

## 2015-11-17 DIAGNOSIS — I10 Essential (primary) hypertension: Secondary | ICD-10-CM | POA: Diagnosis not present

## 2015-11-17 DIAGNOSIS — M4806 Spinal stenosis, lumbar region: Secondary | ICD-10-CM | POA: Diagnosis not present

## 2015-11-17 DIAGNOSIS — J45909 Unspecified asthma, uncomplicated: Secondary | ICD-10-CM | POA: Diagnosis not present

## 2015-11-25 DIAGNOSIS — I10 Essential (primary) hypertension: Secondary | ICD-10-CM | POA: Diagnosis not present

## 2015-11-25 DIAGNOSIS — J45909 Unspecified asthma, uncomplicated: Secondary | ICD-10-CM | POA: Diagnosis not present

## 2015-11-25 DIAGNOSIS — M4806 Spinal stenosis, lumbar region: Secondary | ICD-10-CM | POA: Diagnosis not present

## 2015-11-26 DIAGNOSIS — M4806 Spinal stenosis, lumbar region: Secondary | ICD-10-CM | POA: Diagnosis not present

## 2015-11-26 DIAGNOSIS — I10 Essential (primary) hypertension: Secondary | ICD-10-CM | POA: Diagnosis not present

## 2015-11-26 DIAGNOSIS — J45909 Unspecified asthma, uncomplicated: Secondary | ICD-10-CM | POA: Diagnosis not present

## 2015-11-29 DIAGNOSIS — J45909 Unspecified asthma, uncomplicated: Secondary | ICD-10-CM | POA: Diagnosis not present

## 2015-11-29 DIAGNOSIS — M4806 Spinal stenosis, lumbar region: Secondary | ICD-10-CM | POA: Diagnosis not present

## 2015-11-29 DIAGNOSIS — I10 Essential (primary) hypertension: Secondary | ICD-10-CM | POA: Diagnosis not present

## 2015-11-30 DIAGNOSIS — M4806 Spinal stenosis, lumbar region: Secondary | ICD-10-CM | POA: Diagnosis not present

## 2015-11-30 DIAGNOSIS — J45909 Unspecified asthma, uncomplicated: Secondary | ICD-10-CM | POA: Diagnosis not present

## 2015-11-30 DIAGNOSIS — I10 Essential (primary) hypertension: Secondary | ICD-10-CM | POA: Diagnosis not present

## 2015-12-01 DIAGNOSIS — M4806 Spinal stenosis, lumbar region: Secondary | ICD-10-CM | POA: Diagnosis not present

## 2015-12-01 DIAGNOSIS — J45909 Unspecified asthma, uncomplicated: Secondary | ICD-10-CM | POA: Diagnosis not present

## 2015-12-01 DIAGNOSIS — I10 Essential (primary) hypertension: Secondary | ICD-10-CM | POA: Diagnosis not present

## 2015-12-02 DIAGNOSIS — J45909 Unspecified asthma, uncomplicated: Secondary | ICD-10-CM | POA: Diagnosis not present

## 2015-12-02 DIAGNOSIS — M4806 Spinal stenosis, lumbar region: Secondary | ICD-10-CM | POA: Diagnosis not present

## 2015-12-02 DIAGNOSIS — I10 Essential (primary) hypertension: Secondary | ICD-10-CM | POA: Diagnosis not present

## 2015-12-03 DIAGNOSIS — M4806 Spinal stenosis, lumbar region: Secondary | ICD-10-CM | POA: Diagnosis not present

## 2015-12-03 DIAGNOSIS — I10 Essential (primary) hypertension: Secondary | ICD-10-CM | POA: Diagnosis not present

## 2015-12-03 DIAGNOSIS — J45909 Unspecified asthma, uncomplicated: Secondary | ICD-10-CM | POA: Diagnosis not present

## 2015-12-06 DIAGNOSIS — I10 Essential (primary) hypertension: Secondary | ICD-10-CM | POA: Diagnosis not present

## 2015-12-06 DIAGNOSIS — J45909 Unspecified asthma, uncomplicated: Secondary | ICD-10-CM | POA: Diagnosis not present

## 2015-12-06 DIAGNOSIS — M4806 Spinal stenosis, lumbar region: Secondary | ICD-10-CM | POA: Diagnosis not present

## 2015-12-07 DIAGNOSIS — J45909 Unspecified asthma, uncomplicated: Secondary | ICD-10-CM | POA: Diagnosis not present

## 2015-12-07 DIAGNOSIS — M4806 Spinal stenosis, lumbar region: Secondary | ICD-10-CM | POA: Diagnosis not present

## 2015-12-07 DIAGNOSIS — I10 Essential (primary) hypertension: Secondary | ICD-10-CM | POA: Diagnosis not present

## 2015-12-08 ENCOUNTER — Encounter: Payer: Commercial Managed Care - HMO | Admitting: Podiatry

## 2015-12-08 DIAGNOSIS — J45909 Unspecified asthma, uncomplicated: Secondary | ICD-10-CM | POA: Diagnosis not present

## 2015-12-08 DIAGNOSIS — M4806 Spinal stenosis, lumbar region: Secondary | ICD-10-CM | POA: Diagnosis not present

## 2015-12-08 DIAGNOSIS — I10 Essential (primary) hypertension: Secondary | ICD-10-CM | POA: Diagnosis not present

## 2015-12-08 NOTE — Progress Notes (Signed)
This encounter was created in error - please disregard.

## 2015-12-09 DIAGNOSIS — I10 Essential (primary) hypertension: Secondary | ICD-10-CM | POA: Diagnosis not present

## 2015-12-09 DIAGNOSIS — J45909 Unspecified asthma, uncomplicated: Secondary | ICD-10-CM | POA: Diagnosis not present

## 2015-12-09 DIAGNOSIS — M4806 Spinal stenosis, lumbar region: Secondary | ICD-10-CM | POA: Diagnosis not present

## 2015-12-10 DIAGNOSIS — J45909 Unspecified asthma, uncomplicated: Secondary | ICD-10-CM | POA: Diagnosis not present

## 2015-12-10 DIAGNOSIS — M4806 Spinal stenosis, lumbar region: Secondary | ICD-10-CM | POA: Diagnosis not present

## 2015-12-10 DIAGNOSIS — I10 Essential (primary) hypertension: Secondary | ICD-10-CM | POA: Diagnosis not present

## 2015-12-13 DIAGNOSIS — I10 Essential (primary) hypertension: Secondary | ICD-10-CM | POA: Diagnosis not present

## 2015-12-13 DIAGNOSIS — J45909 Unspecified asthma, uncomplicated: Secondary | ICD-10-CM | POA: Diagnosis not present

## 2015-12-13 DIAGNOSIS — M4806 Spinal stenosis, lumbar region: Secondary | ICD-10-CM | POA: Diagnosis not present

## 2015-12-14 DIAGNOSIS — J45909 Unspecified asthma, uncomplicated: Secondary | ICD-10-CM | POA: Diagnosis not present

## 2015-12-14 DIAGNOSIS — I10 Essential (primary) hypertension: Secondary | ICD-10-CM | POA: Diagnosis not present

## 2015-12-14 DIAGNOSIS — M4806 Spinal stenosis, lumbar region: Secondary | ICD-10-CM | POA: Diagnosis not present

## 2015-12-15 DIAGNOSIS — J45909 Unspecified asthma, uncomplicated: Secondary | ICD-10-CM | POA: Diagnosis not present

## 2015-12-15 DIAGNOSIS — I10 Essential (primary) hypertension: Secondary | ICD-10-CM | POA: Diagnosis not present

## 2015-12-15 DIAGNOSIS — M4806 Spinal stenosis, lumbar region: Secondary | ICD-10-CM | POA: Diagnosis not present

## 2015-12-16 DIAGNOSIS — M4806 Spinal stenosis, lumbar region: Secondary | ICD-10-CM | POA: Diagnosis not present

## 2015-12-16 DIAGNOSIS — I10 Essential (primary) hypertension: Secondary | ICD-10-CM | POA: Diagnosis not present

## 2015-12-16 DIAGNOSIS — J45909 Unspecified asthma, uncomplicated: Secondary | ICD-10-CM | POA: Diagnosis not present

## 2015-12-17 DIAGNOSIS — I10 Essential (primary) hypertension: Secondary | ICD-10-CM | POA: Diagnosis not present

## 2015-12-17 DIAGNOSIS — M4806 Spinal stenosis, lumbar region: Secondary | ICD-10-CM | POA: Diagnosis not present

## 2015-12-17 DIAGNOSIS — J45909 Unspecified asthma, uncomplicated: Secondary | ICD-10-CM | POA: Diagnosis not present

## 2015-12-20 DIAGNOSIS — J45909 Unspecified asthma, uncomplicated: Secondary | ICD-10-CM | POA: Diagnosis not present

## 2015-12-20 DIAGNOSIS — I10 Essential (primary) hypertension: Secondary | ICD-10-CM | POA: Diagnosis not present

## 2015-12-20 DIAGNOSIS — M4806 Spinal stenosis, lumbar region: Secondary | ICD-10-CM | POA: Diagnosis not present

## 2015-12-21 DIAGNOSIS — M4806 Spinal stenosis, lumbar region: Secondary | ICD-10-CM | POA: Diagnosis not present

## 2015-12-21 DIAGNOSIS — I10 Essential (primary) hypertension: Secondary | ICD-10-CM | POA: Diagnosis not present

## 2015-12-21 DIAGNOSIS — J45909 Unspecified asthma, uncomplicated: Secondary | ICD-10-CM | POA: Diagnosis not present

## 2015-12-22 DIAGNOSIS — J45909 Unspecified asthma, uncomplicated: Secondary | ICD-10-CM | POA: Diagnosis not present

## 2015-12-22 DIAGNOSIS — I10 Essential (primary) hypertension: Secondary | ICD-10-CM | POA: Diagnosis not present

## 2015-12-22 DIAGNOSIS — M4806 Spinal stenosis, lumbar region: Secondary | ICD-10-CM | POA: Diagnosis not present

## 2015-12-23 DIAGNOSIS — I10 Essential (primary) hypertension: Secondary | ICD-10-CM | POA: Diagnosis not present

## 2015-12-23 DIAGNOSIS — J45909 Unspecified asthma, uncomplicated: Secondary | ICD-10-CM | POA: Diagnosis not present

## 2015-12-23 DIAGNOSIS — M4806 Spinal stenosis, lumbar region: Secondary | ICD-10-CM | POA: Diagnosis not present

## 2015-12-24 DIAGNOSIS — M4806 Spinal stenosis, lumbar region: Secondary | ICD-10-CM | POA: Diagnosis not present

## 2015-12-24 DIAGNOSIS — J45909 Unspecified asthma, uncomplicated: Secondary | ICD-10-CM | POA: Diagnosis not present

## 2015-12-24 DIAGNOSIS — I10 Essential (primary) hypertension: Secondary | ICD-10-CM | POA: Diagnosis not present

## 2015-12-27 DIAGNOSIS — M4806 Spinal stenosis, lumbar region: Secondary | ICD-10-CM | POA: Diagnosis not present

## 2015-12-27 DIAGNOSIS — I10 Essential (primary) hypertension: Secondary | ICD-10-CM | POA: Diagnosis not present

## 2015-12-27 DIAGNOSIS — J45909 Unspecified asthma, uncomplicated: Secondary | ICD-10-CM | POA: Diagnosis not present

## 2015-12-29 DIAGNOSIS — J45909 Unspecified asthma, uncomplicated: Secondary | ICD-10-CM | POA: Diagnosis not present

## 2015-12-29 DIAGNOSIS — M4806 Spinal stenosis, lumbar region: Secondary | ICD-10-CM | POA: Diagnosis not present

## 2015-12-29 DIAGNOSIS — I10 Essential (primary) hypertension: Secondary | ICD-10-CM | POA: Diagnosis not present

## 2015-12-30 DIAGNOSIS — J45909 Unspecified asthma, uncomplicated: Secondary | ICD-10-CM | POA: Diagnosis not present

## 2015-12-30 DIAGNOSIS — M4806 Spinal stenosis, lumbar region: Secondary | ICD-10-CM | POA: Diagnosis not present

## 2015-12-30 DIAGNOSIS — I10 Essential (primary) hypertension: Secondary | ICD-10-CM | POA: Diagnosis not present

## 2015-12-31 ENCOUNTER — Other Ambulatory Visit: Payer: Self-pay | Admitting: Internal Medicine

## 2015-12-31 DIAGNOSIS — M4806 Spinal stenosis, lumbar region: Secondary | ICD-10-CM | POA: Diagnosis not present

## 2015-12-31 DIAGNOSIS — J45909 Unspecified asthma, uncomplicated: Secondary | ICD-10-CM | POA: Diagnosis not present

## 2015-12-31 DIAGNOSIS — I10 Essential (primary) hypertension: Secondary | ICD-10-CM | POA: Diagnosis not present

## 2016-01-03 DIAGNOSIS — I10 Essential (primary) hypertension: Secondary | ICD-10-CM | POA: Diagnosis not present

## 2016-01-03 DIAGNOSIS — M4806 Spinal stenosis, lumbar region: Secondary | ICD-10-CM | POA: Diagnosis not present

## 2016-01-03 DIAGNOSIS — J45909 Unspecified asthma, uncomplicated: Secondary | ICD-10-CM | POA: Diagnosis not present

## 2016-01-04 DIAGNOSIS — I10 Essential (primary) hypertension: Secondary | ICD-10-CM | POA: Diagnosis not present

## 2016-01-04 DIAGNOSIS — M4806 Spinal stenosis, lumbar region: Secondary | ICD-10-CM | POA: Diagnosis not present

## 2016-01-04 DIAGNOSIS — J45909 Unspecified asthma, uncomplicated: Secondary | ICD-10-CM | POA: Diagnosis not present

## 2016-01-05 DIAGNOSIS — I10 Essential (primary) hypertension: Secondary | ICD-10-CM | POA: Diagnosis not present

## 2016-01-05 DIAGNOSIS — M4806 Spinal stenosis, lumbar region: Secondary | ICD-10-CM | POA: Diagnosis not present

## 2016-01-05 DIAGNOSIS — J45909 Unspecified asthma, uncomplicated: Secondary | ICD-10-CM | POA: Diagnosis not present

## 2016-01-06 DIAGNOSIS — I10 Essential (primary) hypertension: Secondary | ICD-10-CM | POA: Diagnosis not present

## 2016-01-06 DIAGNOSIS — M4806 Spinal stenosis, lumbar region: Secondary | ICD-10-CM | POA: Diagnosis not present

## 2016-01-06 DIAGNOSIS — J45909 Unspecified asthma, uncomplicated: Secondary | ICD-10-CM | POA: Diagnosis not present

## 2016-01-07 DIAGNOSIS — M4806 Spinal stenosis, lumbar region: Secondary | ICD-10-CM | POA: Diagnosis not present

## 2016-01-07 DIAGNOSIS — I10 Essential (primary) hypertension: Secondary | ICD-10-CM | POA: Diagnosis not present

## 2016-01-07 DIAGNOSIS — J45909 Unspecified asthma, uncomplicated: Secondary | ICD-10-CM | POA: Diagnosis not present

## 2016-01-10 DIAGNOSIS — M4806 Spinal stenosis, lumbar region: Secondary | ICD-10-CM | POA: Diagnosis not present

## 2016-01-10 DIAGNOSIS — I10 Essential (primary) hypertension: Secondary | ICD-10-CM | POA: Diagnosis not present

## 2016-01-10 DIAGNOSIS — J45909 Unspecified asthma, uncomplicated: Secondary | ICD-10-CM | POA: Diagnosis not present

## 2016-01-11 ENCOUNTER — Ambulatory Visit: Payer: Commercial Managed Care - HMO | Admitting: Podiatry

## 2016-01-11 ENCOUNTER — Ambulatory Visit (INDEPENDENT_AMBULATORY_CARE_PROVIDER_SITE_OTHER): Payer: Commercial Managed Care - HMO | Admitting: Podiatry

## 2016-01-11 DIAGNOSIS — I10 Essential (primary) hypertension: Secondary | ICD-10-CM | POA: Diagnosis not present

## 2016-01-11 DIAGNOSIS — Q828 Other specified congenital malformations of skin: Secondary | ICD-10-CM | POA: Diagnosis not present

## 2016-01-11 DIAGNOSIS — M79676 Pain in unspecified toe(s): Secondary | ICD-10-CM | POA: Diagnosis not present

## 2016-01-11 DIAGNOSIS — B351 Tinea unguium: Secondary | ICD-10-CM

## 2016-01-11 DIAGNOSIS — J45909 Unspecified asthma, uncomplicated: Secondary | ICD-10-CM | POA: Diagnosis not present

## 2016-01-11 DIAGNOSIS — M4806 Spinal stenosis, lumbar region: Secondary | ICD-10-CM | POA: Diagnosis not present

## 2016-01-11 NOTE — Progress Notes (Signed)
Patient ID: [name], male   DOB: [birth year], 70 y.o.   MRN: [id number]  Subjective: This patient presents today again complaining of elongated, thickened toenails and multiple painful plantar skin lesions right and left feet and is requesting debridement of these areas  Objective: Orientated 3 No open skin lesions bilaterally The toenails are brittle, hypertrophic, deformed, discolored and tender to palpation 6-10 Multiple nucleated plantar keratoses 6 right 4 left  Assessment: Symptomatic onychomycoses 6-10 Porokeratosis 8  Plan: Debridement toenails 10 mechanically and electrically without any bleeding Debrided porokeratosis 10 without any bleeding  Reappoint 3 months

## 2016-01-12 DIAGNOSIS — J45909 Unspecified asthma, uncomplicated: Secondary | ICD-10-CM | POA: Diagnosis not present

## 2016-01-12 DIAGNOSIS — I10 Essential (primary) hypertension: Secondary | ICD-10-CM | POA: Diagnosis not present

## 2016-01-12 DIAGNOSIS — M4806 Spinal stenosis, lumbar region: Secondary | ICD-10-CM | POA: Diagnosis not present

## 2016-01-13 DIAGNOSIS — J45909 Unspecified asthma, uncomplicated: Secondary | ICD-10-CM | POA: Diagnosis not present

## 2016-01-13 DIAGNOSIS — M4806 Spinal stenosis, lumbar region: Secondary | ICD-10-CM | POA: Diagnosis not present

## 2016-01-13 DIAGNOSIS — I10 Essential (primary) hypertension: Secondary | ICD-10-CM | POA: Diagnosis not present

## 2016-01-14 DIAGNOSIS — J45909 Unspecified asthma, uncomplicated: Secondary | ICD-10-CM | POA: Diagnosis not present

## 2016-01-14 DIAGNOSIS — I10 Essential (primary) hypertension: Secondary | ICD-10-CM | POA: Diagnosis not present

## 2016-01-14 DIAGNOSIS — M4806 Spinal stenosis, lumbar region: Secondary | ICD-10-CM | POA: Diagnosis not present

## 2016-01-17 DIAGNOSIS — J45909 Unspecified asthma, uncomplicated: Secondary | ICD-10-CM | POA: Diagnosis not present

## 2016-01-17 DIAGNOSIS — I10 Essential (primary) hypertension: Secondary | ICD-10-CM | POA: Diagnosis not present

## 2016-01-17 DIAGNOSIS — M4806 Spinal stenosis, lumbar region: Secondary | ICD-10-CM | POA: Diagnosis not present

## 2016-01-18 DIAGNOSIS — M4806 Spinal stenosis, lumbar region: Secondary | ICD-10-CM | POA: Diagnosis not present

## 2016-01-18 DIAGNOSIS — I10 Essential (primary) hypertension: Secondary | ICD-10-CM | POA: Diagnosis not present

## 2016-01-18 DIAGNOSIS — J45909 Unspecified asthma, uncomplicated: Secondary | ICD-10-CM | POA: Diagnosis not present

## 2016-01-19 DIAGNOSIS — M4806 Spinal stenosis, lumbar region: Secondary | ICD-10-CM | POA: Diagnosis not present

## 2016-01-19 DIAGNOSIS — I10 Essential (primary) hypertension: Secondary | ICD-10-CM | POA: Diagnosis not present

## 2016-01-19 DIAGNOSIS — J45909 Unspecified asthma, uncomplicated: Secondary | ICD-10-CM | POA: Diagnosis not present

## 2016-01-20 DIAGNOSIS — I10 Essential (primary) hypertension: Secondary | ICD-10-CM | POA: Diagnosis not present

## 2016-01-20 DIAGNOSIS — M4806 Spinal stenosis, lumbar region: Secondary | ICD-10-CM | POA: Diagnosis not present

## 2016-01-20 DIAGNOSIS — J45909 Unspecified asthma, uncomplicated: Secondary | ICD-10-CM | POA: Diagnosis not present

## 2016-01-21 DIAGNOSIS — I10 Essential (primary) hypertension: Secondary | ICD-10-CM | POA: Diagnosis not present

## 2016-01-21 DIAGNOSIS — J45909 Unspecified asthma, uncomplicated: Secondary | ICD-10-CM | POA: Diagnosis not present

## 2016-01-21 DIAGNOSIS — M4806 Spinal stenosis, lumbar region: Secondary | ICD-10-CM | POA: Diagnosis not present

## 2016-01-24 DIAGNOSIS — M4806 Spinal stenosis, lumbar region: Secondary | ICD-10-CM | POA: Diagnosis not present

## 2016-01-24 DIAGNOSIS — I10 Essential (primary) hypertension: Secondary | ICD-10-CM | POA: Diagnosis not present

## 2016-01-24 DIAGNOSIS — J45909 Unspecified asthma, uncomplicated: Secondary | ICD-10-CM | POA: Diagnosis not present

## 2016-01-25 DIAGNOSIS — I10 Essential (primary) hypertension: Secondary | ICD-10-CM | POA: Diagnosis not present

## 2016-01-25 DIAGNOSIS — J45909 Unspecified asthma, uncomplicated: Secondary | ICD-10-CM | POA: Diagnosis not present

## 2016-01-25 DIAGNOSIS — M4806 Spinal stenosis, lumbar region: Secondary | ICD-10-CM | POA: Diagnosis not present

## 2016-01-26 ENCOUNTER — Encounter: Payer: Self-pay | Admitting: Internal Medicine

## 2016-01-26 ENCOUNTER — Ambulatory Visit (INDEPENDENT_AMBULATORY_CARE_PROVIDER_SITE_OTHER): Payer: Commercial Managed Care - HMO | Admitting: Internal Medicine

## 2016-01-26 DIAGNOSIS — I872 Venous insufficiency (chronic) (peripheral): Secondary | ICD-10-CM | POA: Diagnosis not present

## 2016-01-26 DIAGNOSIS — M1711 Unilateral primary osteoarthritis, right knee: Secondary | ICD-10-CM

## 2016-01-26 DIAGNOSIS — I10 Essential (primary) hypertension: Secondary | ICD-10-CM | POA: Diagnosis not present

## 2016-01-26 DIAGNOSIS — J45909 Unspecified asthma, uncomplicated: Secondary | ICD-10-CM | POA: Diagnosis not present

## 2016-01-26 DIAGNOSIS — M4806 Spinal stenosis, lumbar region: Secondary | ICD-10-CM | POA: Diagnosis not present

## 2016-01-26 MED ORDER — OXYCODONE-ACETAMINOPHEN 10-325 MG PO TABS: 1.0000 | ORAL_TABLET | Freq: Four times a day (QID) | ORAL | 0 refills | Status: DC | PRN

## 2016-01-26 MED ORDER — VERAPAMIL HCL ER 180 MG PO TBCR: 180.0000 mg | EXTENDED_RELEASE_TABLET | Freq: Every day | ORAL | 0 refills | Status: DC

## 2016-01-26 NOTE — Patient Instructions (Addendum)
[name]   Please STOP TAKING AMLODIPINE 10mg  and set this medication aside.  Please START TAKING VERAPAMIL 180mg  once daily for 1 month  You have a follow-up appointment for September 1st at 10:15 to re-evaluate your leg swelling.  Please continue a low salt diet

## 2016-01-26 NOTE — Progress Notes (Signed)
   CC: Bilateral lower extremity leg swelling   HPI:  [name] is a 70 y.o. male with PMHx of HTN, Chronic venous insufficiency, and osteoarthritis of right knee that presents to [facility] today for increased leg swelling.  This is a chronic issue but in the last week has noticed slightly more swelling.  He has some mild pain associated with the swelling.  He denies SOB or chest pain.  Past Medical History:  Diagnosis Date  . 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  Excellent symptomatic response to LES Botox injections.   . AVM (arteriovenous malformation) of colon 08/26/2013   Cecum X 3, without bleeding on colonoscopy February 2015   . B12 deficiency 10/01/2011   Discovered to 2 progressive neurologic pain and dysfunction.  Diagnosis established March 2013.  Requires the following treatment: B12 IM monthly until level normalizes. After that, oral supplementation.   . Cataract   . Chronic venous insufficiency 11/28/2013  . Constipation due to opioid therapy 03/11/2015  . Diverticulosis 08/26/2013  . Essential hypertension 06/15/2006  . Family history of malignant neoplasm of gastrointestinal tract 06/30/2013   Father with colon cancer age 30   . Gastroesophageal reflux disease 02/04/2007  . Internal hemorrhoids 08/26/2013  . Left posterior subcapsular cataract 10/03/2013   s/p yag laser capsulotomy 10/03/2013   . 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  Complicated by chronic low back pain and neuropathy.  Nerve conduction study (10/19/11): Diffusely low motor amplitudes, with primarily involvement of the peroneal and posterior tibial nerves. No evidence of generalized peripheral neuropathy. Findings could be c/w bil multilevel lumbosacral radiculopathies or a  primary motor neuropathy.   . Mild aortic valve stenosis 10/09/2006   Echo (12/09/2009): Valve area: 2.03 cm2   . Neuropathy ([place])   . Obesity, Class I, BMI 30-34.9 04/05/2012  . Onychomycosis of toenail 06/05/2014  . Osteoarthritis 11/25/2009   Right knee (medial compartment), right hip, left shoulder   . 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 Trigger little finger of right hand 03/11/2015    Review of Systems: Review of Systems  Neurological: Negative for tingling, sensory change, focal weakness and weakness.  All other systems reviewed and are negative.    Physical Exam:  Vitals:   01/26/16 1341  BP: (!) 148/71  Pulse: 75  Temp: 98.2 F (36.8 C)  TempSrc: Oral  SpO2: 100%  Weight: 283 lb 1.6 oz (128.4 kg)   Physical Exam  Constitutional: He is well-developed, well-nourished, and in no distress.  Uses a cane to help ambulate   HENT:  Head: Normocephalic and atraumatic.  Eyes: Pupils are equal, round, and reactive to light.  Cardiovascular: Normal rate, regular rhythm and normal heart sounds.   Pulmonary/Chest: Effort normal and breath sounds normal.  Abdominal: Soft. There is no tenderness.  Musculoskeletal:  Bilateral lower extremity leg swelling 2+ pitting edema to mid shin No calf tenderness    Assessment & Plan:   See encounters tab for problem based medical decision making.    Patient seen with Dr. Eppie Gibson

## 2016-01-26 NOTE — Assessment & Plan Note (Addendum)
Assessment: OA of the right knee Patient continues to have arthritic right knee pain.  He is well controlled on ibuprofen 800mg  TID PRN and 10-325mg  of Oxycodone-acetaminophen Q6PRN 75 tablets per month.    Plan 3 individual month prescription notes for Oxycodone-acetaminophen 10-325mg  Q6PRN #75 have been given to patient.  He was told he can only fill one prescription at a time.  He was advised to keep his prescriptions in a safe place due to not being able replace if lost.  He is covered from 8/24 - 10/23

## 2016-01-26 NOTE — Assessment & Plan Note (Signed)
Assessment [name] has a history of Chronic Venous Insufficiency.  He reports the swelling in his legs improves in the morning when he wakes up and progressively gets worse throughout the day.  He has compression stockings but due to chronic knee and back pain it is difficult for him to put them on.  Patient takes lisinopril-hydrochlorothiazide 20-12.5mg  2 tablets daily and amlodipine 10mg  daily.  Amlodipine may be contributing to the swelling.  Will stop amlodipine for a short time to see if symptoms improve.  If holding amlodipine does not improve swelling then a trial of lasix may be needed since patient can not use compression stockings.    Plan - Hold amlodipine for one month - Start Verapamil 180mg  daily for one month - Counseled patient on low salt diet, specifically advising on not adding extra salt to meals.  - Follow up appointment for September 1st

## 2016-01-27 DIAGNOSIS — I10 Essential (primary) hypertension: Secondary | ICD-10-CM | POA: Diagnosis not present

## 2016-01-27 DIAGNOSIS — M4806 Spinal stenosis, lumbar region: Secondary | ICD-10-CM | POA: Diagnosis not present

## 2016-01-27 DIAGNOSIS — J45909 Unspecified asthma, uncomplicated: Secondary | ICD-10-CM | POA: Diagnosis not present

## 2016-01-28 DIAGNOSIS — J45909 Unspecified asthma, uncomplicated: Secondary | ICD-10-CM | POA: Diagnosis not present

## 2016-01-28 DIAGNOSIS — M4806 Spinal stenosis, lumbar region: Secondary | ICD-10-CM | POA: Diagnosis not present

## 2016-01-28 DIAGNOSIS — I10 Essential (primary) hypertension: Secondary | ICD-10-CM | POA: Diagnosis not present

## 2016-01-28 NOTE — Progress Notes (Signed)
I saw and evaluated the patient.  I personally confirmed the key portions of Dr. Young Berry history and exam and reviewed pertinent patient test results.  The assessment, diagnosis, and plan were formulated together and I agree with the documentation in the resident's note.

## 2016-01-31 DIAGNOSIS — M4806 Spinal stenosis, lumbar region: Secondary | ICD-10-CM | POA: Diagnosis not present

## 2016-01-31 DIAGNOSIS — J45909 Unspecified asthma, uncomplicated: Secondary | ICD-10-CM | POA: Diagnosis not present

## 2016-01-31 DIAGNOSIS — I10 Essential (primary) hypertension: Secondary | ICD-10-CM | POA: Diagnosis not present

## 2016-02-01 DIAGNOSIS — I10 Essential (primary) hypertension: Secondary | ICD-10-CM | POA: Diagnosis not present

## 2016-02-01 DIAGNOSIS — J45909 Unspecified asthma, uncomplicated: Secondary | ICD-10-CM | POA: Diagnosis not present

## 2016-02-01 DIAGNOSIS — M4806 Spinal stenosis, lumbar region: Secondary | ICD-10-CM | POA: Diagnosis not present

## 2016-02-02 DIAGNOSIS — J45909 Unspecified asthma, uncomplicated: Secondary | ICD-10-CM | POA: Diagnosis not present

## 2016-02-02 DIAGNOSIS — M4806 Spinal stenosis, lumbar region: Secondary | ICD-10-CM | POA: Diagnosis not present

## 2016-02-02 DIAGNOSIS — I10 Essential (primary) hypertension: Secondary | ICD-10-CM | POA: Diagnosis not present

## 2016-02-03 DIAGNOSIS — I10 Essential (primary) hypertension: Secondary | ICD-10-CM | POA: Diagnosis not present

## 2016-02-03 DIAGNOSIS — M4806 Spinal stenosis, lumbar region: Secondary | ICD-10-CM | POA: Diagnosis not present

## 2016-02-03 DIAGNOSIS — J45909 Unspecified asthma, uncomplicated: Secondary | ICD-10-CM | POA: Diagnosis not present

## 2016-02-04 DIAGNOSIS — M4806 Spinal stenosis, lumbar region: Secondary | ICD-10-CM | POA: Diagnosis not present

## 2016-02-04 DIAGNOSIS — J45909 Unspecified asthma, uncomplicated: Secondary | ICD-10-CM | POA: Diagnosis not present

## 2016-02-04 DIAGNOSIS — I10 Essential (primary) hypertension: Secondary | ICD-10-CM | POA: Diagnosis not present

## 2016-02-08 DIAGNOSIS — I10 Essential (primary) hypertension: Secondary | ICD-10-CM | POA: Diagnosis not present

## 2016-02-08 DIAGNOSIS — J45909 Unspecified asthma, uncomplicated: Secondary | ICD-10-CM | POA: Diagnosis not present

## 2016-02-08 DIAGNOSIS — M4806 Spinal stenosis, lumbar region: Secondary | ICD-10-CM | POA: Diagnosis not present

## 2016-02-09 DIAGNOSIS — I10 Essential (primary) hypertension: Secondary | ICD-10-CM | POA: Diagnosis not present

## 2016-02-09 DIAGNOSIS — M4806 Spinal stenosis, lumbar region: Secondary | ICD-10-CM | POA: Diagnosis not present

## 2016-02-09 DIAGNOSIS — J45909 Unspecified asthma, uncomplicated: Secondary | ICD-10-CM | POA: Diagnosis not present

## 2016-02-10 DIAGNOSIS — J45909 Unspecified asthma, uncomplicated: Secondary | ICD-10-CM | POA: Diagnosis not present

## 2016-02-10 DIAGNOSIS — M4806 Spinal stenosis, lumbar region: Secondary | ICD-10-CM | POA: Diagnosis not present

## 2016-02-10 DIAGNOSIS — I10 Essential (primary) hypertension: Secondary | ICD-10-CM | POA: Diagnosis not present

## 2016-02-11 DIAGNOSIS — J45909 Unspecified asthma, uncomplicated: Secondary | ICD-10-CM | POA: Diagnosis not present

## 2016-02-11 DIAGNOSIS — I10 Essential (primary) hypertension: Secondary | ICD-10-CM | POA: Diagnosis not present

## 2016-02-11 DIAGNOSIS — M4806 Spinal stenosis, lumbar region: Secondary | ICD-10-CM | POA: Diagnosis not present

## 2016-02-14 DIAGNOSIS — I10 Essential (primary) hypertension: Secondary | ICD-10-CM | POA: Diagnosis not present

## 2016-02-14 DIAGNOSIS — J45909 Unspecified asthma, uncomplicated: Secondary | ICD-10-CM | POA: Diagnosis not present

## 2016-02-14 DIAGNOSIS — M4806 Spinal stenosis, lumbar region: Secondary | ICD-10-CM | POA: Diagnosis not present

## 2016-02-15 DIAGNOSIS — J45909 Unspecified asthma, uncomplicated: Secondary | ICD-10-CM | POA: Diagnosis not present

## 2016-02-15 DIAGNOSIS — I10 Essential (primary) hypertension: Secondary | ICD-10-CM | POA: Diagnosis not present

## 2016-02-15 DIAGNOSIS — M4806 Spinal stenosis, lumbar region: Secondary | ICD-10-CM | POA: Diagnosis not present

## 2016-02-16 DIAGNOSIS — I10 Essential (primary) hypertension: Secondary | ICD-10-CM | POA: Diagnosis not present

## 2016-02-16 DIAGNOSIS — J45909 Unspecified asthma, uncomplicated: Secondary | ICD-10-CM | POA: Diagnosis not present

## 2016-02-16 DIAGNOSIS — M4806 Spinal stenosis, lumbar region: Secondary | ICD-10-CM | POA: Diagnosis not present

## 2016-02-17 DIAGNOSIS — J45909 Unspecified asthma, uncomplicated: Secondary | ICD-10-CM | POA: Diagnosis not present

## 2016-02-17 DIAGNOSIS — I10 Essential (primary) hypertension: Secondary | ICD-10-CM | POA: Diagnosis not present

## 2016-02-17 DIAGNOSIS — M4806 Spinal stenosis, lumbar region: Secondary | ICD-10-CM | POA: Diagnosis not present

## 2016-02-18 DIAGNOSIS — M4806 Spinal stenosis, lumbar region: Secondary | ICD-10-CM | POA: Diagnosis not present

## 2016-02-18 DIAGNOSIS — J45909 Unspecified asthma, uncomplicated: Secondary | ICD-10-CM | POA: Diagnosis not present

## 2016-02-18 DIAGNOSIS — I10 Essential (primary) hypertension: Secondary | ICD-10-CM | POA: Diagnosis not present

## 2016-02-21 DIAGNOSIS — M4806 Spinal stenosis, lumbar region: Secondary | ICD-10-CM | POA: Diagnosis not present

## 2016-02-21 DIAGNOSIS — J45909 Unspecified asthma, uncomplicated: Secondary | ICD-10-CM | POA: Diagnosis not present

## 2016-02-21 DIAGNOSIS — I10 Essential (primary) hypertension: Secondary | ICD-10-CM | POA: Diagnosis not present

## 2016-02-22 DIAGNOSIS — J45909 Unspecified asthma, uncomplicated: Secondary | ICD-10-CM | POA: Diagnosis not present

## 2016-02-22 DIAGNOSIS — M4806 Spinal stenosis, lumbar region: Secondary | ICD-10-CM | POA: Diagnosis not present

## 2016-02-22 DIAGNOSIS — I10 Essential (primary) hypertension: Secondary | ICD-10-CM | POA: Diagnosis not present

## 2016-02-24 ENCOUNTER — Other Ambulatory Visit: Payer: Self-pay | Admitting: Internal Medicine

## 2016-02-25 ENCOUNTER — Encounter: Payer: Self-pay | Admitting: Internal Medicine

## 2016-02-25 ENCOUNTER — Ambulatory Visit (INDEPENDENT_AMBULATORY_CARE_PROVIDER_SITE_OTHER): Payer: Commercial Managed Care - HMO | Admitting: Internal Medicine

## 2016-02-25 VITALS — BP 132/82 | HR 73 | Temp 98.2°F | Ht 74.0 in | Wt 286.2 lb

## 2016-02-25 DIAGNOSIS — Z87891 Personal history of nicotine dependence: Secondary | ICD-10-CM

## 2016-02-25 DIAGNOSIS — I110 Hypertensive heart disease with heart failure: Secondary | ICD-10-CM

## 2016-02-25 DIAGNOSIS — E669 Obesity, unspecified: Secondary | ICD-10-CM

## 2016-02-25 DIAGNOSIS — I5032 Chronic diastolic (congestive) heart failure: Secondary | ICD-10-CM | POA: Diagnosis not present

## 2016-02-25 DIAGNOSIS — I10 Essential (primary) hypertension: Secondary | ICD-10-CM | POA: Diagnosis not present

## 2016-02-25 DIAGNOSIS — I350 Nonrheumatic aortic (valve) stenosis: Secondary | ICD-10-CM

## 2016-02-25 DIAGNOSIS — J45909 Unspecified asthma, uncomplicated: Secondary | ICD-10-CM | POA: Diagnosis not present

## 2016-02-25 DIAGNOSIS — K219 Gastro-esophageal reflux disease without esophagitis: Secondary | ICD-10-CM

## 2016-02-25 DIAGNOSIS — Z0000 Encounter for general adult medical examination without abnormal findings: Secondary | ICD-10-CM

## 2016-02-25 DIAGNOSIS — M1711 Unilateral primary osteoarthritis, right knee: Secondary | ICD-10-CM

## 2016-02-25 DIAGNOSIS — M65351 Trigger finger, right little finger: Secondary | ICD-10-CM

## 2016-02-25 DIAGNOSIS — I872 Venous insufficiency (chronic) (peripheral): Secondary | ICD-10-CM | POA: Diagnosis not present

## 2016-02-25 DIAGNOSIS — Z23 Encounter for immunization: Secondary | ICD-10-CM

## 2016-02-25 DIAGNOSIS — M4806 Spinal stenosis, lumbar region: Secondary | ICD-10-CM | POA: Diagnosis not present

## 2016-02-25 DIAGNOSIS — Z6836 Body mass index (BMI) 36.0-36.9, adult: Secondary | ICD-10-CM

## 2016-02-25 MED ORDER — VERAPAMIL HCL ER 180 MG PO TBCR: 180.0000 mg | EXTENDED_RELEASE_TABLET | Freq: Every day | ORAL | 3 refills | Status: DC

## 2016-02-25 NOTE — Assessment & Plan Note (Signed)
Assessment  He continues to have some triggering of the little finger on the right hand. His symptoms have recurred after the successful treatment with the A1 pulley injection back in September of last year. He is not interested in a repeat injection or surgical intervention at this time and would like to follow it expectantly.  Plan  We will reassess his right little finger trigger at the follow-up visit and inquire if he would like to pursue intervention for this at that time, if it were to persist.

## 2016-02-25 NOTE — Progress Notes (Signed)
   Subjective:    Patient ID: [name], male    DOB: 26-Jul-1945, 70 y.o.   MRN: [id number]  Hand Pain   Pertinent negatives include no chest pain.    [name] is here for follow-up of his lower extremity edema after stopping the amlodipine, essential hypertension, and knee osteoarthritis. Please see the A&P for the status of the pt's chronic medical problems.  He has one new complaint today. He states over the last month or so he has noticed that he will wake up and have a cough that is nonproductive. This is subsequently followed by shortness of breath. The shortness of breath will resolve on its own after a few minutes, but is not associated with chest pain, palpitations, diaphoresis, or position, he specifically denies orthopnea or paroxysmal nocturnal dyspnea. He also notes during this time that he gets more fatigued with exertion. The degree of exertion is unchanged from his baseline thus this is new for him. Again, he denies fevers, shakes, or chills. He currently does not smoke but is on an ACE inhibitor. He is without other acute complaints.  Review of Systems  Constitutional: Negative for activity change, appetite change, diaphoresis, fever and unexpected weight change.  HENT: Negative for congestion, postnasal drip, rhinorrhea and sinus pressure.   Respiratory: Positive for cough and shortness of breath. Negative for chest tightness and wheezing.   Cardiovascular: Negative for chest pain, palpitations and leg swelling.  Gastrointestinal: Negative for abdominal pain, nausea and vomiting.  Musculoskeletal: Positive for arthralgias and gait problem. Negative for myalgias.  Skin: Negative for color change, rash and wound.      Objective:   Physical Exam  Constitutional: He is oriented to person, place, and time. He appears well-developed and well-nourished. No distress.  HENT:  Head: Normocephalic and atraumatic.  Eyes: Conjunctivae are normal. Right eye exhibits no  discharge. Left eye exhibits no discharge. No scleral icterus.  Cardiovascular: Normal rate and regular rhythm.  Exam reveals no gallop and no friction rub.   Murmur heard. Crescendo-decrescendo systolic murmur best heard along the LSB.  Pulmonary/Chest: Effort normal and breath sounds normal. No respiratory distress. He has no wheezes. He has no rales.  Abdominal: Soft. Bowel sounds are normal. He exhibits no distension. There is no tenderness. There is no rebound and no guarding.  Musculoskeletal: Normal range of motion. He exhibits edema. He exhibits no tenderness.  Trace edema bilaterally in the lower extremities, markedly improved from previously when on the amlodipine. Trigger finger of the right pinky.  Neurological: He is alert and oriented to person, place, and time. He exhibits normal muscle tone.  Skin: Skin is warm and dry. No rash noted. He is not diaphoretic. No erythema.  Psychiatric: He has a normal mood and affect. His behavior is normal. Judgment and thought content normal.  Nursing note and vitals reviewed.     Assessment & Plan:   Please see problem based charting.

## 2016-02-25 NOTE — Assessment & Plan Note (Signed)
Assessment  The cause of his new onset dyspnea on exertion and shortness of breath is unclear. The differential is broad and includes worsening of his previously mild aortic stenosis, worsening of his diastolic dysfunction, aspiration that would also explain the cough, or underlying ischemic heart disease presenting with this anginal equivalent.  Plan  We are first going to assess his aortic valve and diastolic function with an echocardiogram. This will address to possibilities high on the differential. If the cough and shortness of breath with dyspnea on exertion persist we will consider obtaining a chest x-ray, stopping the ACE inhibitor, assessing for underlying ischemia, or formally assessing his swallowing given his previous difficulties with achalasia and the fact that he will also cough after eating and drinking on occasion.

## 2016-02-25 NOTE — Assessment & Plan Note (Signed)
Assessment  His gastroesophageal reflux disease is currently asymptomatic on omeprazole 20 mg by mouth daily. He states he no longer gets the sensation of food sticking in his esophagus as he did prior to therapy for his achalasia.  Plan  We will continue the omeprazole 20 mg by mouth daily and reassess for symptoms of reflux or achalasia at the follow-up visit.

## 2016-02-25 NOTE — Assessment & Plan Note (Signed)
Assessment  His chronic venous insufficiency is clinically markedly improved since stopping the amlodipine and starting the verapamil. Although I believe he continues to have some underlying chronic venous insufficiency the amlodipine was likely playing a significant role in the edema.  Plan  He was asked to throw away the amlodipine as it is unlikely this will be restarted in the future. We will continue with the verapamil, lisinopril, and hydrochlorothiazide for his hypertension and the hydrochlorothiazide for his chronic venous insufficiency. We will reassess for lower extremity swelling at the follow-up visit.

## 2016-02-25 NOTE — Assessment & Plan Note (Signed)
Assessment  His right knee pain is unchanged from baseline. This is while taking oxycodone-acetaminophen 10-325 mg 1 tablet every 6 hours as needed for pain and ibuprofen 800 mg every 8 hours as needed for pain.  Plan  As this regimen is able to reasonably control his pain and allow him to remain ambulatory we will continue at the current doses and reassess the efficacy of this regimen at the follow-up visit.

## 2016-02-25 NOTE — Assessment & Plan Note (Signed)
Assessment  He may have progressive left ventricular hypertrophy or progressive chronic diastolic heart failure leading to his new symptoms of dyspnea on exertion.  Plan  We will assess his left ventricular hypertrophy as well as diastolic dysfunction with a repeat echocardiogram. Further changes in his regimen are pending the results of this study.

## 2016-02-25 NOTE — Assessment & Plan Note (Signed)
Assessment  His blood pressure was well controlled today at 132/82. This is on verapamil 180 mg by mouth daily and lisinopril-hydrochlorothiazide 40-25 milligrams by mouth daily. Despite changing the amlodipine to verapamil his blood pressure remains controlled and, his lower extremity edema is markedly improved suggesting this was, in fact, from the amlodipine.  Plan  We will continue the verapamil at 180 mg by mouth daily and lisinopril-hydrochlorothiazide 40-25 mg by mouth daily. We will reassess his blood pressure control at the follow-up visit.

## 2016-02-25 NOTE — Assessment & Plan Note (Signed)
Assessment  Unfortunately his weight is up 3 pounds from approximately one month ago despite decreased lower extremity edema.  Plan  We will encourage continued work on portion control. I feel with the right knee osteoarthritis as well as the new dyspnea on exertion increasing his activity is not likely to be a successful strategy for weight loss at this time.

## 2016-02-25 NOTE — Assessment & Plan Note (Signed)
He received the flu vaccination today. Other than the Zostavax he is up-to-date on his health care maintenance.

## 2016-02-25 NOTE — Patient Instructions (Signed)
It was good to see you again.  You have done a nice job with your health.  1) Keep taking the medications as you are.  2) I ordered an Echocardiogram to look at your heart and heart valve to see if this is why you are getting more fatigued with walking and why you are getting short of breath.  If needed I will do further testing to figure this out.  3) We gave you the flu shot today.  4) I reordered your verapamil and they should be delivering it to your house early next week.  I will see you back in 3 months, sooner if necessary.

## 2016-02-28 DIAGNOSIS — M4806 Spinal stenosis, lumbar region: Secondary | ICD-10-CM | POA: Diagnosis not present

## 2016-02-28 DIAGNOSIS — I10 Essential (primary) hypertension: Secondary | ICD-10-CM | POA: Diagnosis not present

## 2016-02-28 DIAGNOSIS — J45909 Unspecified asthma, uncomplicated: Secondary | ICD-10-CM | POA: Diagnosis not present

## 2016-02-29 DIAGNOSIS — J45909 Unspecified asthma, uncomplicated: Secondary | ICD-10-CM | POA: Diagnosis not present

## 2016-02-29 DIAGNOSIS — I10 Essential (primary) hypertension: Secondary | ICD-10-CM | POA: Diagnosis not present

## 2016-02-29 DIAGNOSIS — M4806 Spinal stenosis, lumbar region: Secondary | ICD-10-CM | POA: Diagnosis not present

## 2016-03-01 ENCOUNTER — Telehealth: Payer: Self-pay | Admitting: *Deleted

## 2016-03-01 DIAGNOSIS — M4806 Spinal stenosis, lumbar region: Secondary | ICD-10-CM | POA: Diagnosis not present

## 2016-03-01 DIAGNOSIS — J45909 Unspecified asthma, uncomplicated: Secondary | ICD-10-CM | POA: Diagnosis not present

## 2016-03-01 DIAGNOSIS — I10 Essential (primary) hypertension: Secondary | ICD-10-CM | POA: Diagnosis not present

## 2016-03-01 NOTE — Telephone Encounter (Signed)
Received refill request for gabapentin from [facility]-pt does not use this pharmacy for this medication and has additional refill left on file with [facility] mail order pharmacy.  Spoke with patient and contacted [employer] to mail out gabapentin-also contacted [facility] to make them aware.  Phone call complete.Shawn Hidden Cassady9/6/20174:05 PM

## 2016-03-02 DIAGNOSIS — I10 Essential (primary) hypertension: Secondary | ICD-10-CM | POA: Diagnosis not present

## 2016-03-02 DIAGNOSIS — M4806 Spinal stenosis, lumbar region: Secondary | ICD-10-CM | POA: Diagnosis not present

## 2016-03-02 DIAGNOSIS — J45909 Unspecified asthma, uncomplicated: Secondary | ICD-10-CM | POA: Diagnosis not present

## 2016-03-03 DIAGNOSIS — J45909 Unspecified asthma, uncomplicated: Secondary | ICD-10-CM | POA: Diagnosis not present

## 2016-03-03 DIAGNOSIS — M4806 Spinal stenosis, lumbar region: Secondary | ICD-10-CM | POA: Diagnosis not present

## 2016-03-03 DIAGNOSIS — I10 Essential (primary) hypertension: Secondary | ICD-10-CM | POA: Diagnosis not present

## 2016-03-06 DIAGNOSIS — H401113 Primary open-angle glaucoma, right eye, severe stage: Secondary | ICD-10-CM | POA: Diagnosis not present

## 2016-03-06 DIAGNOSIS — H209 Unspecified iridocyclitis: Secondary | ICD-10-CM | POA: Diagnosis not present

## 2016-03-06 DIAGNOSIS — H401122 Primary open-angle glaucoma, left eye, moderate stage: Secondary | ICD-10-CM | POA: Diagnosis not present

## 2016-03-06 DIAGNOSIS — I10 Essential (primary) hypertension: Secondary | ICD-10-CM | POA: Diagnosis not present

## 2016-03-06 DIAGNOSIS — M4806 Spinal stenosis, lumbar region: Secondary | ICD-10-CM | POA: Diagnosis not present

## 2016-03-06 DIAGNOSIS — J45909 Unspecified asthma, uncomplicated: Secondary | ICD-10-CM | POA: Diagnosis not present

## 2016-03-07 DIAGNOSIS — I10 Essential (primary) hypertension: Secondary | ICD-10-CM | POA: Diagnosis not present

## 2016-03-07 DIAGNOSIS — M4806 Spinal stenosis, lumbar region: Secondary | ICD-10-CM | POA: Diagnosis not present

## 2016-03-07 DIAGNOSIS — J45909 Unspecified asthma, uncomplicated: Secondary | ICD-10-CM | POA: Diagnosis not present

## 2016-03-08 DIAGNOSIS — I10 Essential (primary) hypertension: Secondary | ICD-10-CM | POA: Diagnosis not present

## 2016-03-08 DIAGNOSIS — J45909 Unspecified asthma, uncomplicated: Secondary | ICD-10-CM | POA: Diagnosis not present

## 2016-03-08 DIAGNOSIS — M4806 Spinal stenosis, lumbar region: Secondary | ICD-10-CM | POA: Diagnosis not present

## 2016-03-09 DIAGNOSIS — J45909 Unspecified asthma, uncomplicated: Secondary | ICD-10-CM | POA: Diagnosis not present

## 2016-03-09 DIAGNOSIS — I10 Essential (primary) hypertension: Secondary | ICD-10-CM | POA: Diagnosis not present

## 2016-03-09 DIAGNOSIS — M4806 Spinal stenosis, lumbar region: Secondary | ICD-10-CM | POA: Diagnosis not present

## 2016-03-13 ENCOUNTER — Telehealth: Payer: Self-pay | Admitting: Internal Medicine

## 2016-03-13 DIAGNOSIS — J45909 Unspecified asthma, uncomplicated: Secondary | ICD-10-CM | POA: Diagnosis not present

## 2016-03-13 DIAGNOSIS — B351 Tinea unguium: Secondary | ICD-10-CM

## 2016-03-13 DIAGNOSIS — I10 Essential (primary) hypertension: Secondary | ICD-10-CM | POA: Diagnosis not present

## 2016-03-13 NOTE — Telephone Encounter (Signed)
Pt requesting a Referral to the [place] for a toe nail trimming.  Patient's old [employer] Authorization has  expired and a new one is Required.  Please Advise.

## 2016-03-14 DIAGNOSIS — J45909 Unspecified asthma, uncomplicated: Secondary | ICD-10-CM | POA: Diagnosis not present

## 2016-03-14 DIAGNOSIS — I10 Essential (primary) hypertension: Secondary | ICD-10-CM | POA: Diagnosis not present

## 2016-03-15 DIAGNOSIS — I10 Essential (primary) hypertension: Secondary | ICD-10-CM | POA: Diagnosis not present

## 2016-03-15 DIAGNOSIS — J45909 Unspecified asthma, uncomplicated: Secondary | ICD-10-CM | POA: Diagnosis not present

## 2016-03-16 DIAGNOSIS — J45909 Unspecified asthma, uncomplicated: Secondary | ICD-10-CM | POA: Diagnosis not present

## 2016-03-16 DIAGNOSIS — I10 Essential (primary) hypertension: Secondary | ICD-10-CM | POA: Diagnosis not present

## 2016-03-17 DIAGNOSIS — I10 Essential (primary) hypertension: Secondary | ICD-10-CM | POA: Diagnosis not present

## 2016-03-17 DIAGNOSIS — J45909 Unspecified asthma, uncomplicated: Secondary | ICD-10-CM | POA: Diagnosis not present

## 2016-03-20 DIAGNOSIS — J45909 Unspecified asthma, uncomplicated: Secondary | ICD-10-CM | POA: Diagnosis not present

## 2016-03-20 DIAGNOSIS — I10 Essential (primary) hypertension: Secondary | ICD-10-CM | POA: Diagnosis not present

## 2016-03-21 DIAGNOSIS — I10 Essential (primary) hypertension: Secondary | ICD-10-CM | POA: Diagnosis not present

## 2016-03-21 DIAGNOSIS — J45909 Unspecified asthma, uncomplicated: Secondary | ICD-10-CM | POA: Diagnosis not present

## 2016-03-22 DIAGNOSIS — J45909 Unspecified asthma, uncomplicated: Secondary | ICD-10-CM | POA: Diagnosis not present

## 2016-03-22 DIAGNOSIS — I10 Essential (primary) hypertension: Secondary | ICD-10-CM | POA: Diagnosis not present

## 2016-03-23 DIAGNOSIS — I10 Essential (primary) hypertension: Secondary | ICD-10-CM | POA: Diagnosis not present

## 2016-03-23 DIAGNOSIS — J45909 Unspecified asthma, uncomplicated: Secondary | ICD-10-CM | POA: Diagnosis not present

## 2016-03-24 DIAGNOSIS — J45909 Unspecified asthma, uncomplicated: Secondary | ICD-10-CM | POA: Diagnosis not present

## 2016-03-24 DIAGNOSIS — I10 Essential (primary) hypertension: Secondary | ICD-10-CM | POA: Diagnosis not present

## 2016-03-27 DIAGNOSIS — J45909 Unspecified asthma, uncomplicated: Secondary | ICD-10-CM | POA: Diagnosis not present

## 2016-03-27 DIAGNOSIS — I10 Essential (primary) hypertension: Secondary | ICD-10-CM | POA: Diagnosis not present

## 2016-03-28 ENCOUNTER — Other Ambulatory Visit: Payer: Self-pay | Admitting: Internal Medicine

## 2016-03-28 DIAGNOSIS — K219 Gastro-esophageal reflux disease without esophagitis: Secondary | ICD-10-CM

## 2016-03-28 DIAGNOSIS — I10 Essential (primary) hypertension: Secondary | ICD-10-CM | POA: Diagnosis not present

## 2016-03-28 DIAGNOSIS — J45909 Unspecified asthma, uncomplicated: Secondary | ICD-10-CM | POA: Diagnosis not present

## 2016-03-28 NOTE — Telephone Encounter (Signed)
Pt requesting a refill on her   TakingNot TakingUnknown  omeprazole (PRILOSEC) 20 MG capsule

## 2016-03-29 DIAGNOSIS — J45909 Unspecified asthma, uncomplicated: Secondary | ICD-10-CM | POA: Diagnosis not present

## 2016-03-29 DIAGNOSIS — I10 Essential (primary) hypertension: Secondary | ICD-10-CM | POA: Diagnosis not present

## 2016-03-29 MED ORDER — OMEPRAZOLE 20 MG PO CPDR: 20.0000 mg | DELAYED_RELEASE_CAPSULE | Freq: Every day | ORAL | 3 refills | Status: DC

## 2016-03-30 DIAGNOSIS — J45909 Unspecified asthma, uncomplicated: Secondary | ICD-10-CM | POA: Diagnosis not present

## 2016-03-30 DIAGNOSIS — I10 Essential (primary) hypertension: Secondary | ICD-10-CM | POA: Diagnosis not present

## 2016-03-31 DIAGNOSIS — J45909 Unspecified asthma, uncomplicated: Secondary | ICD-10-CM | POA: Diagnosis not present

## 2016-03-31 DIAGNOSIS — I10 Essential (primary) hypertension: Secondary | ICD-10-CM | POA: Diagnosis not present

## 2016-04-03 DIAGNOSIS — J45909 Unspecified asthma, uncomplicated: Secondary | ICD-10-CM | POA: Diagnosis not present

## 2016-04-03 DIAGNOSIS — I10 Essential (primary) hypertension: Secondary | ICD-10-CM | POA: Diagnosis not present

## 2016-04-04 DIAGNOSIS — J45909 Unspecified asthma, uncomplicated: Secondary | ICD-10-CM | POA: Diagnosis not present

## 2016-04-04 DIAGNOSIS — I10 Essential (primary) hypertension: Secondary | ICD-10-CM | POA: Diagnosis not present

## 2016-04-05 DIAGNOSIS — J45909 Unspecified asthma, uncomplicated: Secondary | ICD-10-CM | POA: Diagnosis not present

## 2016-04-05 DIAGNOSIS — I10 Essential (primary) hypertension: Secondary | ICD-10-CM | POA: Diagnosis not present

## 2016-04-06 DIAGNOSIS — J45909 Unspecified asthma, uncomplicated: Secondary | ICD-10-CM | POA: Diagnosis not present

## 2016-04-06 DIAGNOSIS — I10 Essential (primary) hypertension: Secondary | ICD-10-CM | POA: Diagnosis not present

## 2016-04-07 DIAGNOSIS — I10 Essential (primary) hypertension: Secondary | ICD-10-CM | POA: Diagnosis not present

## 2016-04-07 DIAGNOSIS — J45909 Unspecified asthma, uncomplicated: Secondary | ICD-10-CM | POA: Diagnosis not present

## 2016-04-10 DIAGNOSIS — I10 Essential (primary) hypertension: Secondary | ICD-10-CM | POA: Diagnosis not present

## 2016-04-10 DIAGNOSIS — J45909 Unspecified asthma, uncomplicated: Secondary | ICD-10-CM | POA: Diagnosis not present

## 2016-04-11 DIAGNOSIS — I10 Essential (primary) hypertension: Secondary | ICD-10-CM | POA: Diagnosis not present

## 2016-04-11 DIAGNOSIS — J45909 Unspecified asthma, uncomplicated: Secondary | ICD-10-CM | POA: Diagnosis not present

## 2016-04-12 ENCOUNTER — Encounter: Payer: Self-pay | Admitting: Podiatry

## 2016-04-12 ENCOUNTER — Ambulatory Visit (INDEPENDENT_AMBULATORY_CARE_PROVIDER_SITE_OTHER): Payer: Commercial Managed Care - HMO | Admitting: Podiatry

## 2016-04-12 VITALS — BP 166/93 | HR 73 | Resp 28

## 2016-04-12 DIAGNOSIS — B351 Tinea unguium: Secondary | ICD-10-CM

## 2016-04-12 DIAGNOSIS — I10 Essential (primary) hypertension: Secondary | ICD-10-CM | POA: Diagnosis not present

## 2016-04-12 DIAGNOSIS — M79676 Pain in unspecified toe(s): Secondary | ICD-10-CM

## 2016-04-12 DIAGNOSIS — Q828 Other specified congenital malformations of skin: Secondary | ICD-10-CM

## 2016-04-12 DIAGNOSIS — J45909 Unspecified asthma, uncomplicated: Secondary | ICD-10-CM | POA: Diagnosis not present

## 2016-04-12 NOTE — Telephone Encounter (Signed)
Pt sch for 04/12/2016 with [facility] for his Podiatry Appt.

## 2016-04-12 NOTE — Progress Notes (Signed)
Patient ID: [name], male   DOB: 19-Apr-1946, 70 y.o.   MRN: [id number]    Subjective: This patient presents today again complaining of elongated, thickened toenails and multiple painful plantar skin lesions right and left feet and is requesting debridement of these areas  Objective: Orientated 3 DP pulses 2/4 bilaterally PT pulses 1/4 bilaterally Sensation to 10 g monofilament wire intact 4/5 bilaterally Ankle reflexes nonreactive bilaterally Pes planus bilaterally No open skin lesions bilaterally The toenails are brittle, hypertrophic, deformed, discolored and tender to palpation 6-10 Multiple nucleated plantar keratoses 6   Assessment: Symptomatic onychomycoses 6-10 Porokeratosis 6  Plan: Debridement toenails 10 mechanically and electrically without any bleeding Debrided porokeratosis 6without any bleeding  Reappoint 3 months

## 2016-04-13 DIAGNOSIS — I10 Essential (primary) hypertension: Secondary | ICD-10-CM | POA: Diagnosis not present

## 2016-04-13 DIAGNOSIS — J45909 Unspecified asthma, uncomplicated: Secondary | ICD-10-CM | POA: Diagnosis not present

## 2016-04-18 DIAGNOSIS — J45909 Unspecified asthma, uncomplicated: Secondary | ICD-10-CM | POA: Diagnosis not present

## 2016-04-18 DIAGNOSIS — I10 Essential (primary) hypertension: Secondary | ICD-10-CM | POA: Diagnosis not present

## 2016-04-19 DIAGNOSIS — I10 Essential (primary) hypertension: Secondary | ICD-10-CM | POA: Diagnosis not present

## 2016-04-19 DIAGNOSIS — J45909 Unspecified asthma, uncomplicated: Secondary | ICD-10-CM | POA: Diagnosis not present

## 2016-04-20 DIAGNOSIS — J45909 Unspecified asthma, uncomplicated: Secondary | ICD-10-CM | POA: Diagnosis not present

## 2016-04-20 DIAGNOSIS — I10 Essential (primary) hypertension: Secondary | ICD-10-CM | POA: Diagnosis not present

## 2016-04-21 DIAGNOSIS — J45909 Unspecified asthma, uncomplicated: Secondary | ICD-10-CM | POA: Diagnosis not present

## 2016-04-21 DIAGNOSIS — I10 Essential (primary) hypertension: Secondary | ICD-10-CM | POA: Diagnosis not present

## 2016-04-24 DIAGNOSIS — J45909 Unspecified asthma, uncomplicated: Secondary | ICD-10-CM | POA: Diagnosis not present

## 2016-04-24 DIAGNOSIS — I10 Essential (primary) hypertension: Secondary | ICD-10-CM | POA: Diagnosis not present

## 2016-04-25 DIAGNOSIS — I10 Essential (primary) hypertension: Secondary | ICD-10-CM | POA: Diagnosis not present

## 2016-04-25 DIAGNOSIS — J45909 Unspecified asthma, uncomplicated: Secondary | ICD-10-CM | POA: Diagnosis not present

## 2016-04-26 DIAGNOSIS — J45909 Unspecified asthma, uncomplicated: Secondary | ICD-10-CM | POA: Diagnosis not present

## 2016-04-26 DIAGNOSIS — I10 Essential (primary) hypertension: Secondary | ICD-10-CM | POA: Diagnosis not present

## 2016-04-27 DIAGNOSIS — I10 Essential (primary) hypertension: Secondary | ICD-10-CM | POA: Diagnosis not present

## 2016-04-27 DIAGNOSIS — J45909 Unspecified asthma, uncomplicated: Secondary | ICD-10-CM | POA: Diagnosis not present

## 2016-04-28 DIAGNOSIS — I10 Essential (primary) hypertension: Secondary | ICD-10-CM | POA: Diagnosis not present

## 2016-04-28 DIAGNOSIS — J45909 Unspecified asthma, uncomplicated: Secondary | ICD-10-CM | POA: Diagnosis not present

## 2016-05-01 DIAGNOSIS — J45909 Unspecified asthma, uncomplicated: Secondary | ICD-10-CM | POA: Diagnosis not present

## 2016-05-01 DIAGNOSIS — I10 Essential (primary) hypertension: Secondary | ICD-10-CM | POA: Diagnosis not present

## 2016-05-02 DIAGNOSIS — I10 Essential (primary) hypertension: Secondary | ICD-10-CM | POA: Diagnosis not present

## 2016-05-02 DIAGNOSIS — J45909 Unspecified asthma, uncomplicated: Secondary | ICD-10-CM | POA: Diagnosis not present

## 2016-05-03 DIAGNOSIS — J45909 Unspecified asthma, uncomplicated: Secondary | ICD-10-CM | POA: Diagnosis not present

## 2016-05-03 DIAGNOSIS — I10 Essential (primary) hypertension: Secondary | ICD-10-CM | POA: Diagnosis not present

## 2016-05-04 DIAGNOSIS — J45909 Unspecified asthma, uncomplicated: Secondary | ICD-10-CM | POA: Diagnosis not present

## 2016-05-04 DIAGNOSIS — I10 Essential (primary) hypertension: Secondary | ICD-10-CM | POA: Diagnosis not present

## 2016-05-05 DIAGNOSIS — J45909 Unspecified asthma, uncomplicated: Secondary | ICD-10-CM | POA: Diagnosis not present

## 2016-05-05 DIAGNOSIS — I10 Essential (primary) hypertension: Secondary | ICD-10-CM | POA: Diagnosis not present

## 2016-05-08 DIAGNOSIS — I10 Essential (primary) hypertension: Secondary | ICD-10-CM | POA: Diagnosis not present

## 2016-05-08 DIAGNOSIS — J45909 Unspecified asthma, uncomplicated: Secondary | ICD-10-CM | POA: Diagnosis not present

## 2016-05-09 DIAGNOSIS — I10 Essential (primary) hypertension: Secondary | ICD-10-CM | POA: Diagnosis not present

## 2016-05-09 DIAGNOSIS — J45909 Unspecified asthma, uncomplicated: Secondary | ICD-10-CM | POA: Diagnosis not present

## 2016-05-10 DIAGNOSIS — I10 Essential (primary) hypertension: Secondary | ICD-10-CM | POA: Diagnosis not present

## 2016-05-10 DIAGNOSIS — J45909 Unspecified asthma, uncomplicated: Secondary | ICD-10-CM | POA: Diagnosis not present

## 2016-05-11 DIAGNOSIS — J45909 Unspecified asthma, uncomplicated: Secondary | ICD-10-CM | POA: Diagnosis not present

## 2016-05-11 DIAGNOSIS — I10 Essential (primary) hypertension: Secondary | ICD-10-CM | POA: Diagnosis not present

## 2016-05-12 DIAGNOSIS — J45909 Unspecified asthma, uncomplicated: Secondary | ICD-10-CM | POA: Diagnosis not present

## 2016-05-12 DIAGNOSIS — I10 Essential (primary) hypertension: Secondary | ICD-10-CM | POA: Diagnosis not present

## 2016-05-15 ENCOUNTER — Other Ambulatory Visit: Payer: Self-pay | Admitting: Internal Medicine

## 2016-05-15 DIAGNOSIS — J45909 Unspecified asthma, uncomplicated: Secondary | ICD-10-CM | POA: Diagnosis not present

## 2016-05-15 DIAGNOSIS — M1711 Unilateral primary osteoarthritis, right knee: Secondary | ICD-10-CM

## 2016-05-15 DIAGNOSIS — I10 Essential (primary) hypertension: Secondary | ICD-10-CM | POA: Diagnosis not present

## 2016-05-15 MED ORDER — OXYCODONE-ACETAMINOPHEN 10-325 MG PO TABS: 1.0000 | ORAL_TABLET | Freq: Four times a day (QID) | ORAL | 0 refills | Status: DC | PRN

## 2016-05-15 NOTE — Telephone Encounter (Signed)
Patient notified refill ready

## 2016-05-15 NOTE — Telephone Encounter (Signed)
Written 01/26/2016 note do not refill prior to 04/17/2016 last appt on 03/15/2016 follow up 05/26/2016 last UDS 03/10/2016

## 2016-05-15 NOTE — Telephone Encounter (Signed)
Pain med refill °

## 2016-05-16 DIAGNOSIS — J45909 Unspecified asthma, uncomplicated: Secondary | ICD-10-CM | POA: Diagnosis not present

## 2016-05-16 DIAGNOSIS — I10 Essential (primary) hypertension: Secondary | ICD-10-CM | POA: Diagnosis not present

## 2016-05-17 DIAGNOSIS — I10 Essential (primary) hypertension: Secondary | ICD-10-CM | POA: Diagnosis not present

## 2016-05-17 DIAGNOSIS — J45909 Unspecified asthma, uncomplicated: Secondary | ICD-10-CM | POA: Diagnosis not present

## 2016-05-18 DIAGNOSIS — J45909 Unspecified asthma, uncomplicated: Secondary | ICD-10-CM | POA: Diagnosis not present

## 2016-05-18 DIAGNOSIS — I10 Essential (primary) hypertension: Secondary | ICD-10-CM | POA: Diagnosis not present

## 2016-05-23 DIAGNOSIS — I10 Essential (primary) hypertension: Secondary | ICD-10-CM | POA: Diagnosis not present

## 2016-05-23 DIAGNOSIS — J45909 Unspecified asthma, uncomplicated: Secondary | ICD-10-CM | POA: Diagnosis not present

## 2016-05-24 DIAGNOSIS — J45909 Unspecified asthma, uncomplicated: Secondary | ICD-10-CM | POA: Diagnosis not present

## 2016-05-24 DIAGNOSIS — I10 Essential (primary) hypertension: Secondary | ICD-10-CM | POA: Diagnosis not present

## 2016-05-25 DIAGNOSIS — I10 Essential (primary) hypertension: Secondary | ICD-10-CM | POA: Diagnosis not present

## 2016-05-25 DIAGNOSIS — J45909 Unspecified asthma, uncomplicated: Secondary | ICD-10-CM | POA: Diagnosis not present

## 2016-05-26 ENCOUNTER — Encounter: Payer: Self-pay | Admitting: Internal Medicine

## 2016-05-26 ENCOUNTER — Ambulatory Visit (INDEPENDENT_AMBULATORY_CARE_PROVIDER_SITE_OTHER): Payer: Commercial Managed Care - HMO | Admitting: Internal Medicine

## 2016-05-26 ENCOUNTER — Encounter (INDEPENDENT_AMBULATORY_CARE_PROVIDER_SITE_OTHER): Payer: Self-pay

## 2016-05-26 VITALS — BP 134/67 | HR 67 | Temp 98.3°F | Wt 278.4 lb

## 2016-05-26 DIAGNOSIS — E669 Obesity, unspecified: Secondary | ICD-10-CM

## 2016-05-26 DIAGNOSIS — M7989 Other specified soft tissue disorders: Secondary | ICD-10-CM

## 2016-05-26 DIAGNOSIS — I350 Nonrheumatic aortic (valve) stenosis: Secondary | ICD-10-CM

## 2016-05-26 DIAGNOSIS — E538 Deficiency of other specified B group vitamins: Secondary | ICD-10-CM

## 2016-05-26 DIAGNOSIS — Z87891 Personal history of nicotine dependence: Secondary | ICD-10-CM

## 2016-05-26 DIAGNOSIS — K5909 Other constipation: Secondary | ICD-10-CM

## 2016-05-26 DIAGNOSIS — R0609 Other forms of dyspnea: Secondary | ICD-10-CM

## 2016-05-26 DIAGNOSIS — K220 Achalasia of cardia: Secondary | ICD-10-CM | POA: Diagnosis not present

## 2016-05-26 DIAGNOSIS — I872 Venous insufficiency (chronic) (peripheral): Secondary | ICD-10-CM

## 2016-05-26 DIAGNOSIS — I10 Essential (primary) hypertension: Secondary | ICD-10-CM | POA: Diagnosis not present

## 2016-05-26 DIAGNOSIS — M1711 Unilateral primary osteoarthritis, right knee: Secondary | ICD-10-CM | POA: Diagnosis not present

## 2016-05-26 DIAGNOSIS — J45909 Unspecified asthma, uncomplicated: Secondary | ICD-10-CM | POA: Diagnosis not present

## 2016-05-26 DIAGNOSIS — T402X5A Adverse effect of other opioids, initial encounter: Secondary | ICD-10-CM

## 2016-05-26 DIAGNOSIS — K5903 Drug induced constipation: Secondary | ICD-10-CM

## 2016-05-26 DIAGNOSIS — Z79899 Other long term (current) drug therapy: Secondary | ICD-10-CM

## 2016-05-26 DIAGNOSIS — Z79891 Long term (current) use of opiate analgesic: Secondary | ICD-10-CM

## 2016-05-26 DIAGNOSIS — Z6835 Body mass index (BMI) 35.0-35.9, adult: Secondary | ICD-10-CM

## 2016-05-26 DIAGNOSIS — M65351 Trigger finger, right little finger: Secondary | ICD-10-CM

## 2016-05-26 MED ORDER — VITAMIN B-12 1000 MCG PO TABS: 1000.0000 ug | ORAL_TABLET | Freq: Every day | ORAL | 3 refills | Status: DC

## 2016-05-26 MED ORDER — OXYCODONE-ACETAMINOPHEN 10-325 MG PO TABS: 1.0000 | ORAL_TABLET | Freq: Four times a day (QID) | ORAL | 0 refills | Status: DC | PRN

## 2016-05-26 MED ORDER — SORBITOL 70 % PO SOLN: 15.0000 mL | Freq: Every day | ORAL | 3 refills | Status: DC | PRN

## 2016-05-26 NOTE — Assessment & Plan Note (Signed)
Assessment  His blood pressure today is well controlled at 134/67. This is on lisinopril-hydrochlorothiazide 40-25 mg by mouth daily and verapamil 180 mg by mouth daily. He is tolerating this regimen well.  Plan  We will continue lisinopril-hydrochlorothiazide and verapamil at the current doses and reassess his blood pressure at the follow-up visit.

## 2016-05-26 NOTE — Assessment & Plan Note (Signed)
Assessment  He denies any recurrence of his difficulty swallowing suggesting his achalasia remains controlled.  Plan  We will reassess for symptoms suggestive of recurrent achalasia at the follow-up visit.

## 2016-05-26 NOTE — Assessment & Plan Note (Signed)
Assessment  He continues to have stable chronic right knee pain from osteoarthritis. It responds well to as needed ibuprofen and Percocet for severe pain. This regimen allows him to remain functional and ambulate.  Plan  We will continue the ibuprofen at 800 mg by mouth every 8 hours as needed for moderate pain and Percocet 10-325 mg 1 tablet every 6 hours as needed for severe pain dispense #75 per month. His last Percocet dose was yesterday evening and a UDS was obtained this morning and is pending at the time of this dictation. He was also given a written prescription for Percocet to get him through February. We will follow-up on the results of the UDS and reassess his pain control on this regimen at the follow-up visit.

## 2016-05-26 NOTE — Assessment & Plan Note (Signed)
Assessment  He has lost 8 pounds since the last clinic visit 3 months ago.  Plan  He was praised on his weight loss and was encouraged to continue to monitor his food intake. With further weight loss it is hoped that his chronic right knee osteoarthritis pain improves.

## 2016-05-26 NOTE — Assessment & Plan Note (Signed)
Assessment  He continues to have constipation likely related to his opioid therapy. He found the sorbitol to be effective, but ran out. He would like another prescription for the sorbitol.  Plan  We will restart the sorbitol 70% titrating to the desired number of bowel movements per day. We will reassess the efficacy of this therapy at the follow-up visit.

## 2016-05-26 NOTE — Assessment & Plan Note (Signed)
Assessment  He continues to have mild exertional dyspnea. The cause remains unclear although I am concerned he may have progression of his mild aortic valve stenosis that was seen on echocardiogram in 2011. At the last clinic visit we had ordered an echocardiogram but apparently this was not scheduled.  Plan  We will obtain an echocardiogram. This was scheduled during this visit. Further evaluation, therapy, and monitoring are pending the results of his echocardiogram.

## 2016-05-26 NOTE — Assessment & Plan Note (Signed)
Assessment  He continues to have bilateral lower extremity swelling which is symmetric and consistent with his chronic venous insufficiency. At this time it is not affecting his ambulation.  Plan  He was encouraged to keep his legs elevated as much as possible. We are continuing the hydrochlorothiazide in hopes of improving some of this venous insufficiency. He is not a good candidate for compression stockings given the difficulty with him putting them on. We will reassess the degree of his chronic venous insufficiency at the follow-up visit.

## 2016-05-26 NOTE — Patient Instructions (Signed)
It was good to see you again.  I am sorry you were not feeling good last week.  1) Keep taking the medications as you are.  Take the vitamin B12 every day.  I renewed your sorbitol for your constipation.  2) We got a urine test today.  3) I gave you an extra percocet prescription.  4) We will try to get that Echo scheduled for your heart to see if that is why you may be a little short of breath.  I will see you back in 3 months, sooner if necessary.

## 2016-05-26 NOTE — Assessment & Plan Note (Signed)
Assessment  He admits to not consistently taking his vitamin B12 oral supplementation. That being said, he has no signs or symptoms suggestive of B12 deficiency at this time.  Plan  He was encouraged to take his vitamin B12 supplementation on a daily basis. If he does so, we will check a vitamin B12 level at the follow-up visit.

## 2016-05-26 NOTE — Progress Notes (Signed)
   Subjective:    Patient ID: [name], male    DOB: 1946-06-18, 70 y.o.   MRN: [id number]  HPI  [name] is here for follow-up of his chronic osteoarthritic pain, HTN, chronic venous insufficiency, and vitamin B12 deficiency. Please see the A&P for the status of the pt's chronic medical problems.  One week ago he developed an upper respiratory tract infection with cough and chills. Prior to this, his cough that we addressed at the previous visit had all but resolved. Although he feels much better, he continues to have a residual cough after his acute upper respiratory infection. He is without other acute complaints at this time.  Review of Systems  Constitutional: Positive for chills. Negative for activity change, appetite change and unexpected weight change.       Chills with recent URI  HENT: Positive for congestion and postnasal drip. Negative for sore throat and trouble swallowing.   Respiratory: Positive for cough. Negative for chest tightness, shortness of breath and wheezing.   Cardiovascular: Positive for leg swelling. Negative for chest pain and palpitations.       Bilateral lower extremity swelling that is chronic and unchanged  Gastrointestinal: Positive for constipation. Negative for abdominal distention, abdominal pain, diarrhea, nausea and vomiting.  Musculoskeletal: Positive for arthralgias. Negative for back pain, joint swelling and myalgias.  Skin: Negative for color change, rash and wound.      Objective:   Physical Exam  Constitutional: He is oriented to person, place, and time. He appears well-developed and well-nourished. No distress.  HENT:  Head: Normocephalic and atraumatic.  Eyes: Conjunctivae are normal. Right eye exhibits no discharge. Left eye exhibits no discharge. No scleral icterus.  Cardiovascular: Normal rate and regular rhythm.  Exam reveals no gallop and no friction rub.   Murmur heard. Pulmonary/Chest: Effort normal and breath sounds  normal. No respiratory distress. He has no wheezes. He has no rales.  Abdominal: Soft. He exhibits no distension. There is no tenderness. There is no rebound and no guarding.  Musculoskeletal: Normal range of motion. He exhibits edema. He exhibits no tenderness.  Lower extremity edema is symmetric and unchanged (2+ to mid shin)  Neurological: He is alert and oriented to person, place, and time. He exhibits normal muscle tone.  Skin: Skin is warm and dry. No rash noted. He is not diaphoretic. No erythema.  Psychiatric: He has a normal mood and affect. His behavior is normal. Judgment and thought content normal.  Nursing note and vitals reviewed.     Assessment & Plan:   Please see problem oriented charting.

## 2016-05-26 NOTE — Assessment & Plan Note (Signed)
Assessment  He continues to have an occasional triggering of his little finger on the right hand. He is not interested in intervention at this time.  Plan  We will continue to follow expectantly. If the triggering worsens and he wishes referral back to sports medicine we will do it at that time.

## 2016-05-29 DIAGNOSIS — J45909 Unspecified asthma, uncomplicated: Secondary | ICD-10-CM | POA: Diagnosis not present

## 2016-05-29 DIAGNOSIS — I10 Essential (primary) hypertension: Secondary | ICD-10-CM | POA: Diagnosis not present

## 2016-05-30 DIAGNOSIS — J45909 Unspecified asthma, uncomplicated: Secondary | ICD-10-CM | POA: Diagnosis not present

## 2016-05-30 DIAGNOSIS — I10 Essential (primary) hypertension: Secondary | ICD-10-CM | POA: Diagnosis not present

## 2016-05-31 DIAGNOSIS — J45909 Unspecified asthma, uncomplicated: Secondary | ICD-10-CM | POA: Diagnosis not present

## 2016-05-31 DIAGNOSIS — I10 Essential (primary) hypertension: Secondary | ICD-10-CM | POA: Diagnosis not present

## 2016-06-01 DIAGNOSIS — I10 Essential (primary) hypertension: Secondary | ICD-10-CM | POA: Diagnosis not present

## 2016-06-01 DIAGNOSIS — J45909 Unspecified asthma, uncomplicated: Secondary | ICD-10-CM | POA: Diagnosis not present

## 2016-06-01 LAB — TOXASSURE SELECT,+ANTIDEPR,UR

## 2016-06-02 DIAGNOSIS — I10 Essential (primary) hypertension: Secondary | ICD-10-CM | POA: Diagnosis not present

## 2016-06-02 DIAGNOSIS — J45909 Unspecified asthma, uncomplicated: Secondary | ICD-10-CM | POA: Diagnosis not present

## 2016-06-02 NOTE — Progress Notes (Signed)
Patient ID: [name], male   DOB: 1946/04/25, 70 y.o.   MRN: [id number]  UDS did not reveal any oxycodone but he had not taken any PRN oxycodone for at least 24 hours prior to the urine sample being given.  Thus, I do not consider this necessarily an inappropriate test result.

## 2016-06-05 DIAGNOSIS — I10 Essential (primary) hypertension: Secondary | ICD-10-CM | POA: Diagnosis not present

## 2016-06-05 DIAGNOSIS — J45909 Unspecified asthma, uncomplicated: Secondary | ICD-10-CM | POA: Diagnosis not present

## 2016-06-06 ENCOUNTER — Other Ambulatory Visit: Payer: Self-pay | Admitting: *Deleted

## 2016-06-06 DIAGNOSIS — I350 Nonrheumatic aortic (valve) stenosis: Secondary | ICD-10-CM

## 2016-06-06 DIAGNOSIS — I10 Essential (primary) hypertension: Secondary | ICD-10-CM | POA: Diagnosis not present

## 2016-06-06 DIAGNOSIS — J45909 Unspecified asthma, uncomplicated: Secondary | ICD-10-CM | POA: Diagnosis not present

## 2016-06-07 DIAGNOSIS — I10 Essential (primary) hypertension: Secondary | ICD-10-CM | POA: Diagnosis not present

## 2016-06-07 DIAGNOSIS — J45909 Unspecified asthma, uncomplicated: Secondary | ICD-10-CM | POA: Diagnosis not present

## 2016-06-08 DIAGNOSIS — J45909 Unspecified asthma, uncomplicated: Secondary | ICD-10-CM | POA: Diagnosis not present

## 2016-06-08 DIAGNOSIS — I10 Essential (primary) hypertension: Secondary | ICD-10-CM | POA: Diagnosis not present

## 2016-06-09 DIAGNOSIS — J45909 Unspecified asthma, uncomplicated: Secondary | ICD-10-CM | POA: Diagnosis not present

## 2016-06-09 DIAGNOSIS — I10 Essential (primary) hypertension: Secondary | ICD-10-CM | POA: Diagnosis not present

## 2016-06-12 DIAGNOSIS — J45909 Unspecified asthma, uncomplicated: Secondary | ICD-10-CM | POA: Diagnosis not present

## 2016-06-12 DIAGNOSIS — I10 Essential (primary) hypertension: Secondary | ICD-10-CM | POA: Diagnosis not present

## 2016-06-13 DIAGNOSIS — J45909 Unspecified asthma, uncomplicated: Secondary | ICD-10-CM | POA: Diagnosis not present

## 2016-06-13 DIAGNOSIS — I10 Essential (primary) hypertension: Secondary | ICD-10-CM | POA: Diagnosis not present

## 2016-06-14 DIAGNOSIS — J45909 Unspecified asthma, uncomplicated: Secondary | ICD-10-CM | POA: Diagnosis not present

## 2016-06-14 DIAGNOSIS — I10 Essential (primary) hypertension: Secondary | ICD-10-CM | POA: Diagnosis not present

## 2016-06-15 DIAGNOSIS — I10 Essential (primary) hypertension: Secondary | ICD-10-CM | POA: Diagnosis not present

## 2016-06-15 DIAGNOSIS — J45909 Unspecified asthma, uncomplicated: Secondary | ICD-10-CM | POA: Diagnosis not present

## 2016-06-16 DIAGNOSIS — J45909 Unspecified asthma, uncomplicated: Secondary | ICD-10-CM | POA: Diagnosis not present

## 2016-06-16 DIAGNOSIS — I10 Essential (primary) hypertension: Secondary | ICD-10-CM | POA: Diagnosis not present

## 2016-06-20 ENCOUNTER — Ambulatory Visit (HOSPITAL_COMMUNITY)
Admission: RE | Admit: 2016-06-20 | Discharge: 2016-06-20 | Disposition: A | Discharge status: Home / Self Care | Payer: Commercial Managed Care - HMO | Source: Ambulatory Visit | Attending: Internal Medicine | Admitting: Internal Medicine

## 2016-06-20 DIAGNOSIS — I10 Essential (primary) hypertension: Secondary | ICD-10-CM | POA: Insufficient documentation

## 2016-06-20 DIAGNOSIS — E669 Obesity, unspecified: Secondary | ICD-10-CM | POA: Diagnosis not present

## 2016-06-20 DIAGNOSIS — J45909 Unspecified asthma, uncomplicated: Secondary | ICD-10-CM | POA: Diagnosis not present

## 2016-06-20 DIAGNOSIS — Z6835 Body mass index (BMI) 35.0-35.9, adult: Secondary | ICD-10-CM | POA: Insufficient documentation

## 2016-06-20 DIAGNOSIS — I350 Nonrheumatic aortic (valve) stenosis: Secondary | ICD-10-CM | POA: Diagnosis not present

## 2016-06-20 NOTE — Progress Notes (Signed)
  Echocardiogram 2D Echocardiogram has been performed.  Doneisha Ivey 06/20/2016, 10:16 AM

## 2016-06-21 DIAGNOSIS — J45909 Unspecified asthma, uncomplicated: Secondary | ICD-10-CM | POA: Diagnosis not present

## 2016-06-21 DIAGNOSIS — I10 Essential (primary) hypertension: Secondary | ICD-10-CM | POA: Diagnosis not present

## 2016-06-22 DIAGNOSIS — J45909 Unspecified asthma, uncomplicated: Secondary | ICD-10-CM | POA: Diagnosis not present

## 2016-06-22 DIAGNOSIS — I10 Essential (primary) hypertension: Secondary | ICD-10-CM | POA: Diagnosis not present

## 2016-06-23 DIAGNOSIS — J45909 Unspecified asthma, uncomplicated: Secondary | ICD-10-CM | POA: Diagnosis not present

## 2016-06-23 DIAGNOSIS — I10 Essential (primary) hypertension: Secondary | ICD-10-CM | POA: Diagnosis not present

## 2016-06-26 DIAGNOSIS — I10 Essential (primary) hypertension: Secondary | ICD-10-CM | POA: Diagnosis not present

## 2016-06-26 DIAGNOSIS — J45909 Unspecified asthma, uncomplicated: Secondary | ICD-10-CM | POA: Diagnosis not present

## 2016-06-27 DIAGNOSIS — J45909 Unspecified asthma, uncomplicated: Secondary | ICD-10-CM | POA: Diagnosis not present

## 2016-06-27 DIAGNOSIS — I10 Essential (primary) hypertension: Secondary | ICD-10-CM | POA: Diagnosis not present

## 2016-06-28 DIAGNOSIS — I10 Essential (primary) hypertension: Secondary | ICD-10-CM | POA: Diagnosis not present

## 2016-06-28 DIAGNOSIS — J45909 Unspecified asthma, uncomplicated: Secondary | ICD-10-CM | POA: Diagnosis not present

## 2016-06-29 DIAGNOSIS — I10 Essential (primary) hypertension: Secondary | ICD-10-CM | POA: Diagnosis not present

## 2016-06-29 DIAGNOSIS — J45909 Unspecified asthma, uncomplicated: Secondary | ICD-10-CM | POA: Diagnosis not present

## 2016-06-30 ENCOUNTER — Encounter: Payer: Self-pay | Admitting: Internal Medicine

## 2016-06-30 DIAGNOSIS — J45909 Unspecified asthma, uncomplicated: Secondary | ICD-10-CM | POA: Diagnosis not present

## 2016-06-30 DIAGNOSIS — I10 Essential (primary) hypertension: Secondary | ICD-10-CM | POA: Diagnosis not present

## 2016-06-30 NOTE — Progress Notes (Signed)
Echo demonstrated moderate aortic valve stenosis with trivial AI.  I am doubtful this would explain his mild dyspnea.  We will reassess his dyspnea at the follow-up visit to see if it is improves, unchanged, or worsened and look for other potential clues to an etiology should it persist.

## 2016-07-03 DIAGNOSIS — J45909 Unspecified asthma, uncomplicated: Secondary | ICD-10-CM | POA: Diagnosis not present

## 2016-07-03 DIAGNOSIS — I10 Essential (primary) hypertension: Secondary | ICD-10-CM | POA: Diagnosis not present

## 2016-07-04 DIAGNOSIS — J45909 Unspecified asthma, uncomplicated: Secondary | ICD-10-CM | POA: Diagnosis not present

## 2016-07-04 DIAGNOSIS — I10 Essential (primary) hypertension: Secondary | ICD-10-CM | POA: Diagnosis not present

## 2016-07-05 DIAGNOSIS — I10 Essential (primary) hypertension: Secondary | ICD-10-CM | POA: Diagnosis not present

## 2016-07-05 DIAGNOSIS — J45909 Unspecified asthma, uncomplicated: Secondary | ICD-10-CM | POA: Diagnosis not present

## 2016-07-05 DIAGNOSIS — H209 Unspecified iridocyclitis: Secondary | ICD-10-CM | POA: Diagnosis not present

## 2016-07-05 DIAGNOSIS — H401113 Primary open-angle glaucoma, right eye, severe stage: Secondary | ICD-10-CM | POA: Diagnosis not present

## 2016-07-05 DIAGNOSIS — H401122 Primary open-angle glaucoma, left eye, moderate stage: Secondary | ICD-10-CM | POA: Diagnosis not present

## 2016-07-06 DIAGNOSIS — J45909 Unspecified asthma, uncomplicated: Secondary | ICD-10-CM | POA: Diagnosis not present

## 2016-07-06 DIAGNOSIS — I10 Essential (primary) hypertension: Secondary | ICD-10-CM | POA: Diagnosis not present

## 2016-07-07 DIAGNOSIS — J45909 Unspecified asthma, uncomplicated: Secondary | ICD-10-CM | POA: Diagnosis not present

## 2016-07-07 DIAGNOSIS — I10 Essential (primary) hypertension: Secondary | ICD-10-CM | POA: Diagnosis not present

## 2016-07-11 DIAGNOSIS — I10 Essential (primary) hypertension: Secondary | ICD-10-CM | POA: Diagnosis not present

## 2016-07-11 DIAGNOSIS — J45909 Unspecified asthma, uncomplicated: Secondary | ICD-10-CM | POA: Diagnosis not present

## 2016-07-12 ENCOUNTER — Ambulatory Visit: Payer: Commercial Managed Care - HMO | Admitting: Podiatry

## 2016-07-12 DIAGNOSIS — I10 Essential (primary) hypertension: Secondary | ICD-10-CM | POA: Diagnosis not present

## 2016-07-12 DIAGNOSIS — J45909 Unspecified asthma, uncomplicated: Secondary | ICD-10-CM | POA: Diagnosis not present

## 2016-07-13 DIAGNOSIS — J45909 Unspecified asthma, uncomplicated: Secondary | ICD-10-CM | POA: Diagnosis not present

## 2016-07-13 DIAGNOSIS — I10 Essential (primary) hypertension: Secondary | ICD-10-CM | POA: Diagnosis not present

## 2016-07-14 DIAGNOSIS — J45909 Unspecified asthma, uncomplicated: Secondary | ICD-10-CM | POA: Diagnosis not present

## 2016-07-14 DIAGNOSIS — I10 Essential (primary) hypertension: Secondary | ICD-10-CM | POA: Diagnosis not present

## 2016-07-17 DIAGNOSIS — I10 Essential (primary) hypertension: Secondary | ICD-10-CM | POA: Diagnosis not present

## 2016-07-17 DIAGNOSIS — J45909 Unspecified asthma, uncomplicated: Secondary | ICD-10-CM | POA: Diagnosis not present

## 2016-07-18 DIAGNOSIS — I10 Essential (primary) hypertension: Secondary | ICD-10-CM | POA: Diagnosis not present

## 2016-07-18 DIAGNOSIS — J45909 Unspecified asthma, uncomplicated: Secondary | ICD-10-CM | POA: Diagnosis not present

## 2016-07-19 ENCOUNTER — Ambulatory Visit (INDEPENDENT_AMBULATORY_CARE_PROVIDER_SITE_OTHER): Payer: Medicare HMO | Admitting: Podiatry

## 2016-07-19 ENCOUNTER — Encounter: Payer: Self-pay | Admitting: Podiatry

## 2016-07-19 VITALS — BP 134/78 | HR 75 | Resp 18

## 2016-07-19 DIAGNOSIS — Q828 Other specified congenital malformations of skin: Secondary | ICD-10-CM

## 2016-07-19 DIAGNOSIS — B351 Tinea unguium: Secondary | ICD-10-CM

## 2016-07-19 DIAGNOSIS — M79676 Pain in unspecified toe(s): Secondary | ICD-10-CM

## 2016-07-19 DIAGNOSIS — I10 Essential (primary) hypertension: Secondary | ICD-10-CM | POA: Diagnosis not present

## 2016-07-19 DIAGNOSIS — J45909 Unspecified asthma, uncomplicated: Secondary | ICD-10-CM | POA: Diagnosis not present

## 2016-07-20 DIAGNOSIS — J45909 Unspecified asthma, uncomplicated: Secondary | ICD-10-CM | POA: Diagnosis not present

## 2016-07-20 DIAGNOSIS — I10 Essential (primary) hypertension: Secondary | ICD-10-CM | POA: Diagnosis not present

## 2016-07-20 NOTE — Progress Notes (Signed)
Patient ID: [name], male   DOB: 07/05/1945, 71 y.o.   MRN: [id number]    Subjective: This patient presents today again complaining of elongated, thickened toenails and multiple painful plantar skin lesions right and left feet and is requesting debridement of these areas  Objective: Orientated 3 DP pulses 2/4 bilaterally PT pulses 1/4 bilaterally Sensation to 10 g monofilament wire intact 4/5 bilaterally Vibratory sensation nonreactive bilaterally Ankle reflexes nonreactive bilaterally Walker required for ambulation Dorsi flexion, plantar flexion 5/5 bilaterally Pes planus bilaterally No open skin lesions bilaterally Partial sloughing of second left toenail with dry crusted nailbed without any surrounding erythema, edema, warmth, drainage The toenails are brittle, hypertrophic, deformed, discolored and tender to palpation 6-10 Multiple nucleated plantar keratoses 6   Assessment: Symptomatic onychomycoses 6-10 Porokeratosis 6  Plan: Debridement toenails 10 mechanically and electrically without any bleeding Debrided porokeratosis 6 without any bleeding  Reappoint 3 months 

## 2016-07-21 DIAGNOSIS — I10 Essential (primary) hypertension: Secondary | ICD-10-CM | POA: Diagnosis not present

## 2016-07-21 DIAGNOSIS — J45909 Unspecified asthma, uncomplicated: Secondary | ICD-10-CM | POA: Diagnosis not present

## 2016-07-25 DIAGNOSIS — I10 Essential (primary) hypertension: Secondary | ICD-10-CM | POA: Diagnosis not present

## 2016-07-25 DIAGNOSIS — J45909 Unspecified asthma, uncomplicated: Secondary | ICD-10-CM | POA: Diagnosis not present

## 2016-07-26 DIAGNOSIS — I10 Essential (primary) hypertension: Secondary | ICD-10-CM | POA: Diagnosis not present

## 2016-07-26 DIAGNOSIS — J45909 Unspecified asthma, uncomplicated: Secondary | ICD-10-CM | POA: Diagnosis not present

## 2016-07-27 DIAGNOSIS — I10 Essential (primary) hypertension: Secondary | ICD-10-CM | POA: Diagnosis not present

## 2016-07-27 DIAGNOSIS — J45909 Unspecified asthma, uncomplicated: Secondary | ICD-10-CM | POA: Diagnosis not present

## 2016-07-28 DIAGNOSIS — J45909 Unspecified asthma, uncomplicated: Secondary | ICD-10-CM | POA: Diagnosis not present

## 2016-07-28 DIAGNOSIS — I10 Essential (primary) hypertension: Secondary | ICD-10-CM | POA: Diagnosis not present

## 2016-07-31 DIAGNOSIS — J45909 Unspecified asthma, uncomplicated: Secondary | ICD-10-CM | POA: Diagnosis not present

## 2016-07-31 DIAGNOSIS — I10 Essential (primary) hypertension: Secondary | ICD-10-CM | POA: Diagnosis not present

## 2016-08-01 DIAGNOSIS — J45909 Unspecified asthma, uncomplicated: Secondary | ICD-10-CM | POA: Diagnosis not present

## 2016-08-01 DIAGNOSIS — I10 Essential (primary) hypertension: Secondary | ICD-10-CM | POA: Diagnosis not present

## 2016-08-02 DIAGNOSIS — I10 Essential (primary) hypertension: Secondary | ICD-10-CM | POA: Diagnosis not present

## 2016-08-02 DIAGNOSIS — J45909 Unspecified asthma, uncomplicated: Secondary | ICD-10-CM | POA: Diagnosis not present

## 2016-08-03 DIAGNOSIS — J45909 Unspecified asthma, uncomplicated: Secondary | ICD-10-CM | POA: Diagnosis not present

## 2016-08-03 DIAGNOSIS — I10 Essential (primary) hypertension: Secondary | ICD-10-CM | POA: Diagnosis not present

## 2016-08-04 DIAGNOSIS — I10 Essential (primary) hypertension: Secondary | ICD-10-CM | POA: Diagnosis not present

## 2016-08-04 DIAGNOSIS — J45909 Unspecified asthma, uncomplicated: Secondary | ICD-10-CM | POA: Diagnosis not present

## 2016-08-07 DIAGNOSIS — J45909 Unspecified asthma, uncomplicated: Secondary | ICD-10-CM | POA: Diagnosis not present

## 2016-08-07 DIAGNOSIS — I10 Essential (primary) hypertension: Secondary | ICD-10-CM | POA: Diagnosis not present

## 2016-08-08 DIAGNOSIS — J45909 Unspecified asthma, uncomplicated: Secondary | ICD-10-CM | POA: Diagnosis not present

## 2016-08-08 DIAGNOSIS — I10 Essential (primary) hypertension: Secondary | ICD-10-CM | POA: Diagnosis not present

## 2016-08-09 DIAGNOSIS — I10 Essential (primary) hypertension: Secondary | ICD-10-CM | POA: Diagnosis not present

## 2016-08-09 DIAGNOSIS — J45909 Unspecified asthma, uncomplicated: Secondary | ICD-10-CM | POA: Diagnosis not present

## 2016-08-10 DIAGNOSIS — I10 Essential (primary) hypertension: Secondary | ICD-10-CM | POA: Diagnosis not present

## 2016-08-10 DIAGNOSIS — J45909 Unspecified asthma, uncomplicated: Secondary | ICD-10-CM | POA: Diagnosis not present

## 2016-08-11 DIAGNOSIS — J45909 Unspecified asthma, uncomplicated: Secondary | ICD-10-CM | POA: Diagnosis not present

## 2016-08-11 DIAGNOSIS — I10 Essential (primary) hypertension: Secondary | ICD-10-CM | POA: Diagnosis not present

## 2016-08-14 DIAGNOSIS — J45909 Unspecified asthma, uncomplicated: Secondary | ICD-10-CM | POA: Diagnosis not present

## 2016-08-14 DIAGNOSIS — I10 Essential (primary) hypertension: Secondary | ICD-10-CM | POA: Diagnosis not present

## 2016-08-15 DIAGNOSIS — J45909 Unspecified asthma, uncomplicated: Secondary | ICD-10-CM | POA: Diagnosis not present

## 2016-08-15 DIAGNOSIS — I10 Essential (primary) hypertension: Secondary | ICD-10-CM | POA: Diagnosis not present

## 2016-08-16 DIAGNOSIS — I10 Essential (primary) hypertension: Secondary | ICD-10-CM | POA: Diagnosis not present

## 2016-08-16 DIAGNOSIS — J45909 Unspecified asthma, uncomplicated: Secondary | ICD-10-CM | POA: Diagnosis not present

## 2016-08-17 DIAGNOSIS — I10 Essential (primary) hypertension: Secondary | ICD-10-CM | POA: Diagnosis not present

## 2016-08-17 DIAGNOSIS — J45909 Unspecified asthma, uncomplicated: Secondary | ICD-10-CM | POA: Diagnosis not present

## 2016-08-18 DIAGNOSIS — J45909 Unspecified asthma, uncomplicated: Secondary | ICD-10-CM | POA: Diagnosis not present

## 2016-08-18 DIAGNOSIS — I10 Essential (primary) hypertension: Secondary | ICD-10-CM | POA: Diagnosis not present

## 2016-08-21 DIAGNOSIS — J45909 Unspecified asthma, uncomplicated: Secondary | ICD-10-CM | POA: Diagnosis not present

## 2016-08-21 DIAGNOSIS — I10 Essential (primary) hypertension: Secondary | ICD-10-CM | POA: Diagnosis not present

## 2016-08-22 DIAGNOSIS — I10 Essential (primary) hypertension: Secondary | ICD-10-CM | POA: Diagnosis not present

## 2016-08-22 DIAGNOSIS — J45909 Unspecified asthma, uncomplicated: Secondary | ICD-10-CM | POA: Diagnosis not present

## 2016-08-23 DIAGNOSIS — I10 Essential (primary) hypertension: Secondary | ICD-10-CM | POA: Diagnosis not present

## 2016-08-23 DIAGNOSIS — J45909 Unspecified asthma, uncomplicated: Secondary | ICD-10-CM | POA: Diagnosis not present

## 2016-08-24 DIAGNOSIS — I10 Essential (primary) hypertension: Secondary | ICD-10-CM | POA: Diagnosis not present

## 2016-08-24 DIAGNOSIS — J45909 Unspecified asthma, uncomplicated: Secondary | ICD-10-CM | POA: Diagnosis not present

## 2016-08-25 ENCOUNTER — Ambulatory Visit (INDEPENDENT_AMBULATORY_CARE_PROVIDER_SITE_OTHER): Payer: Medicare HMO | Admitting: Internal Medicine

## 2016-08-25 ENCOUNTER — Encounter: Payer: Self-pay | Admitting: Internal Medicine

## 2016-08-25 VITALS — BP 146/76 | HR 85 | Temp 98.6°F | Wt 269.9 lb

## 2016-08-25 DIAGNOSIS — I10 Essential (primary) hypertension: Secondary | ICD-10-CM

## 2016-08-25 DIAGNOSIS — E538 Deficiency of other specified B group vitamins: Secondary | ICD-10-CM | POA: Diagnosis not present

## 2016-08-25 DIAGNOSIS — Z0000 Encounter for general adult medical examination without abnormal findings: Secondary | ICD-10-CM

## 2016-08-25 DIAGNOSIS — Z87891 Personal history of nicotine dependence: Secondary | ICD-10-CM

## 2016-08-25 DIAGNOSIS — M1711 Unilateral primary osteoarthritis, right knee: Secondary | ICD-10-CM | POA: Diagnosis not present

## 2016-08-25 DIAGNOSIS — T402X5D Adverse effect of other opioids, subsequent encounter: Secondary | ICD-10-CM

## 2016-08-25 DIAGNOSIS — T402X5A Adverse effect of other opioids, initial encounter: Secondary | ICD-10-CM

## 2016-08-25 DIAGNOSIS — Z6834 Body mass index (BMI) 34.0-34.9, adult: Secondary | ICD-10-CM

## 2016-08-25 DIAGNOSIS — K220 Achalasia of cardia: Secondary | ICD-10-CM

## 2016-08-25 DIAGNOSIS — K5903 Drug induced constipation: Secondary | ICD-10-CM

## 2016-08-25 DIAGNOSIS — Z8719 Personal history of other diseases of the digestive system: Secondary | ICD-10-CM

## 2016-08-25 DIAGNOSIS — I872 Venous insufficiency (chronic) (peripheral): Secondary | ICD-10-CM

## 2016-08-25 DIAGNOSIS — Z79899 Other long term (current) drug therapy: Secondary | ICD-10-CM

## 2016-08-25 DIAGNOSIS — J45909 Unspecified asthma, uncomplicated: Secondary | ICD-10-CM | POA: Diagnosis not present

## 2016-08-25 DIAGNOSIS — I350 Nonrheumatic aortic (valve) stenosis: Secondary | ICD-10-CM

## 2016-08-25 DIAGNOSIS — L853 Xerosis cutis: Secondary | ICD-10-CM | POA: Diagnosis not present

## 2016-08-25 DIAGNOSIS — M48062 Spinal stenosis, lumbar region with neurogenic claudication: Secondary | ICD-10-CM

## 2016-08-25 DIAGNOSIS — E669 Obesity, unspecified: Secondary | ICD-10-CM | POA: Diagnosis not present

## 2016-08-25 DIAGNOSIS — Z79891 Long term (current) use of opiate analgesic: Secondary | ICD-10-CM

## 2016-08-25 DIAGNOSIS — R2689 Other abnormalities of gait and mobility: Secondary | ICD-10-CM | POA: Diagnosis not present

## 2016-08-25 MED ORDER — OXYCODONE-ACETAMINOPHEN 10-325 MG PO TABS: 1.0000 | ORAL_TABLET | Freq: Four times a day (QID) | ORAL | 0 refills | Status: DC | PRN

## 2016-08-25 NOTE — Assessment & Plan Note (Signed)
Assessment  He continues to have chronic low back pain related to his lumbar stenosis. His ambulation is limited and requires assistive devices but this is likely more related to the knees and the back. The pain is well controlled on as needed oxycodone which allows him to be functional when the pain is severe.  Plan  We will continue the as needed oxycodone-acetaminophen 10-325 mg 1 tablet every 6 hours as needed for severe back pain dispense #75 per month. We will reassess the efficacy of this therapy in maintaining his functional status at the follow-up visit.

## 2016-08-25 NOTE — Assessment & Plan Note (Signed)
Assessment  He states he's been taking his vitamin B12 oral tablet consistently. We therefore checked a vitamin B12 level which is pending at the time of this dictation.  Plan  We will assess the vitamin B12 level once it has resulted to better evaluate the efficacy of oral replacement for his B12 deficiency. If his B12 level remains low we may need to resort to intramuscular supplementation.

## 2016-08-25 NOTE — Assessment & Plan Note (Signed)
Assessment  His constipation is well-regulated by as needed sorbitol. He is quite happy with the efficacy of this therapy.  Plan  We will continue the sorbitol as needed for constipation, titrated to the frequency he desires. We will reassess the efficacy of this therapy at the follow-up visit.

## 2016-08-25 NOTE — Patient Instructions (Addendum)
It was good to see you again.  1) I will fill out the necessary paperwork to see if you can be approved for more hours.  2) I drew blood to check on your kidneys and vitamin B12 levels.  I will call you next week if there are any concerns.  3) Keep taking the medications as you are.  4) I gave you 3 prescriptions for oxycodone which should get you through mid May.  I will see you in 3 months, sooner if necessary.

## 2016-08-25 NOTE — Assessment & Plan Note (Signed)
He is up-to-date on his health care maintenance. 

## 2016-08-25 NOTE — Assessment & Plan Note (Signed)
Assessment  An echocardiogram done in the interim revealed moderate calcific aortic stenosis with a mean gradient of 13 mmHg and a peak gradient of 26 mmHg. His dyspnea is slightly improved today and I suspect his exercise tolerance is limited more by his right knee arthropathy and lumbar stenosis then from his aortic stenosis.  Plan  We will continue to monitor for symptoms suggestive of a more critical aortic stenosis lesion and repeat the echocardiogram in early 2020 to assess for progression of his moderate calcific aortic stenosis.

## 2016-08-25 NOTE — Assessment & Plan Note (Signed)
Assessment  His blood pressure today was slightly elevated compared to usual and slightly above target. It was 146/76. This is on lisinopril-hydrochlorothiazide 40-25 mg by mouth daily and verapamil 180 mg by mouth daily.  Plan  As this was an isolated elevation compared to usual we decided to continue with the current therapy and reassess the blood pressure at the follow-up visit. He is also going to continue to work on weight loss which may help the blood pressure. If his blood pressure remains above target at the follow-up visit we will likely increase the verapamil.

## 2016-08-25 NOTE — Assessment & Plan Note (Signed)
Assessment  He has decreased his portion size with a resultant 9 pound decrease in his weight in the last 3 months.  Plan  He was praised at his dietary changes and the resultant weight loss and was encouraged to continue to monitor his portion sizes. We will reassess his weight at the follow-up visit.

## 2016-08-25 NOTE — Progress Notes (Signed)
   Subjective:    Patient ID: [name], male    DOB: 02/25/1946, 71 y.o.   MRN: [id number]  HPI  [name] is here for follow-up of his right knee osteoarthritis, chronic low back pain, essential hypertension, obesity, chronic venous insufficiency, and vitamin B12 deficiency. Please see the A&P for the status of the pt's chronic medical problems.  Review of Systems  Constitutional: Negative for activity change, appetite change and unexpected weight change.  Respiratory: Negative for chest tightness, shortness of breath and wheezing.   Cardiovascular: Positive for leg swelling. Negative for chest pain and palpitations.       Unchanged BLE swelling from chronic venous insufficiency  Gastrointestinal: Positive for constipation. Negative for abdominal distention, abdominal pain, diarrhea, nausea and vomiting.       Constipation well regulated on PRN sorbitol.  Musculoskeletal: Positive for arthralgias, back pain and gait problem. Negative for joint swelling and myalgias.       Occasional right knee giveway when using the cane.  No giveway when using the walker.  Skin: Negative for color change and wound.      Objective:   Physical Exam  Constitutional: He is oriented to person, place, and time. He appears well-developed and well-nourished. No distress.  HENT:  Head: Normocephalic and atraumatic.  Eyes: Conjunctivae are normal. Right eye exhibits no discharge. Left eye exhibits no discharge. No scleral icterus.  Cardiovascular: Normal rate and regular rhythm.  Exam reveals no gallop and no friction rub.   Murmur heard. Pulmonary/Chest: Effort normal and breath sounds normal. No respiratory distress. He has no wheezes. He has no rales.  Abdominal: Soft. He exhibits no distension. There is no tenderness. There is no rebound and no guarding.  Musculoskeletal: He exhibits edema. He exhibits no tenderness or deformity.  Antalgic gait which is hunched over.  Neurological: He is  alert and oriented to person, place, and time. He exhibits normal muscle tone.  Skin: Skin is warm and dry. Rash noted. He is not diaphoretic. No erythema.  Xerosis of BLE.  Unchanged  Psychiatric: He has a normal mood and affect. His behavior is normal. Judgment and thought content normal.  Nursing note and vitals reviewed.     Assessment & Plan:   Please see problem based charting.

## 2016-08-25 NOTE — Assessment & Plan Note (Signed)
Assessment  His right knee continues to give him problems. The pain is reasonably well controlled on ibuprofen 800 mg by mouth every 8 hours as needed. When he uses his cane to ambulate outside of the house he has had occasional giveaway at the right knee. This is not a problem when he uses his walker within his house. We discussed possible laxity within the knee and if it was severe the need for potential surgical intervention. At this point, he states it is not severe enough that he would want to undergo any surgical intervention.  Plan  He will continue using his assistive devices and as needed ibuprofen for his right knee arthralgia. We will reassess his symptoms at the follow-up visit.

## 2016-08-25 NOTE — Assessment & Plan Note (Signed)
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  Plan  We will continue to monitor for recurrence of the achalasia and the need for Botox instillation once again.

## 2016-08-26 LAB — BMP8+ANION GAP
Anion Gap: 26 mmol/L — ABNORMAL HIGH (ref 10.0–18.0)
BUN/Creatinine Ratio: 8 — ABNORMAL LOW (ref 10–24)
BUN: 8 mg/dL (ref 8–27)
CALCIUM: 9.8 mg/dL (ref 8.6–10.2)
CO2: 18 mmol/L (ref 18–29)
Chloride: 101 mmol/L (ref 96–106)
Creatinine, Ser: 1.03 mg/dL (ref 0.76–1.27)
GFR, EST AFRICAN AMERICAN: 83 mL/min/{1.73_m2} (ref >59)
GFR, EST NON AFRICAN AMERICAN: 72 mL/min/{1.73_m2} (ref >59)
Glucose: 86 mg/dL (ref 65–99)
POTASSIUM: 4.2 mmol/L (ref 3.5–5.2)
Sodium: 145 mmol/L — ABNORMAL HIGH (ref 134–144)

## 2016-08-26 LAB — VITAMIN B12: VITAMIN B 12: 394 pg/mL (ref 232–1245)

## 2016-08-28 NOTE — Progress Notes (Signed)
Patient ID: [name], male   DOB: 1946/02/02, 71 y.o.   MRN: [id number]  BMP Na 145, K 4.2, Cl 101, HCO3 18, BUN 8, Cr 1.03, glucose 86, Ca 9.8, eGFR 83, AG 26  Cause for isolated anion gap is unclear.  He is on no medications that commonly do this and his renal function is excellent suggesting he does not have an intrinsic reason for a renal tubular acidosis.  At this point we will monitor clinically.  B12 394  Improving in vitamin B12 level with reinstitution of oral B12 supplementation.  We will continue with this route of supplementation.

## 2016-08-30 DIAGNOSIS — J45909 Unspecified asthma, uncomplicated: Secondary | ICD-10-CM | POA: Diagnosis not present

## 2016-08-30 DIAGNOSIS — I10 Essential (primary) hypertension: Secondary | ICD-10-CM | POA: Diagnosis not present

## 2016-08-31 DIAGNOSIS — I10 Essential (primary) hypertension: Secondary | ICD-10-CM | POA: Diagnosis not present

## 2016-08-31 DIAGNOSIS — J45909 Unspecified asthma, uncomplicated: Secondary | ICD-10-CM | POA: Diagnosis not present

## 2016-09-01 DIAGNOSIS — I10 Essential (primary) hypertension: Secondary | ICD-10-CM | POA: Diagnosis not present

## 2016-09-01 DIAGNOSIS — J45909 Unspecified asthma, uncomplicated: Secondary | ICD-10-CM | POA: Diagnosis not present

## 2016-09-04 ENCOUNTER — Telehealth: Payer: Self-pay

## 2016-09-04 DIAGNOSIS — J45909 Unspecified asthma, uncomplicated: Secondary | ICD-10-CM | POA: Diagnosis not present

## 2016-09-04 DIAGNOSIS — I10 Essential (primary) hypertension: Secondary | ICD-10-CM | POA: Diagnosis not present

## 2016-09-04 NOTE — Telephone Encounter (Signed)
Left patient message regarding request for more time PCS request placed in providers box for signature will fax to [facility] after form completed

## 2016-09-04 NOTE — Telephone Encounter (Signed)
Paperwork signed.  Thank You.

## 2016-09-05 DIAGNOSIS — I10 Essential (primary) hypertension: Secondary | ICD-10-CM | POA: Diagnosis not present

## 2016-09-05 DIAGNOSIS — J45909 Unspecified asthma, uncomplicated: Secondary | ICD-10-CM | POA: Diagnosis not present

## 2016-09-05 NOTE — Telephone Encounter (Signed)
Calling Margaret back

## 2016-09-06 DIAGNOSIS — J45909 Unspecified asthma, uncomplicated: Secondary | ICD-10-CM | POA: Diagnosis not present

## 2016-09-06 DIAGNOSIS — I10 Essential (primary) hypertension: Secondary | ICD-10-CM | POA: Diagnosis not present

## 2016-09-07 DIAGNOSIS — H209 Unspecified iridocyclitis: Secondary | ICD-10-CM | POA: Diagnosis not present

## 2016-09-07 DIAGNOSIS — J45909 Unspecified asthma, uncomplicated: Secondary | ICD-10-CM | POA: Diagnosis not present

## 2016-09-07 DIAGNOSIS — H401113 Primary open-angle glaucoma, right eye, severe stage: Secondary | ICD-10-CM | POA: Diagnosis not present

## 2016-09-07 DIAGNOSIS — I10 Essential (primary) hypertension: Secondary | ICD-10-CM | POA: Diagnosis not present

## 2016-09-07 DIAGNOSIS — H401122 Primary open-angle glaucoma, left eye, moderate stage: Secondary | ICD-10-CM | POA: Diagnosis not present

## 2016-09-08 DIAGNOSIS — J45909 Unspecified asthma, uncomplicated: Secondary | ICD-10-CM | POA: Diagnosis not present

## 2016-09-08 DIAGNOSIS — I10 Essential (primary) hypertension: Secondary | ICD-10-CM | POA: Diagnosis not present

## 2016-09-11 DIAGNOSIS — I10 Essential (primary) hypertension: Secondary | ICD-10-CM | POA: Diagnosis not present

## 2016-09-11 DIAGNOSIS — J45909 Unspecified asthma, uncomplicated: Secondary | ICD-10-CM | POA: Diagnosis not present

## 2016-09-12 DIAGNOSIS — I10 Essential (primary) hypertension: Secondary | ICD-10-CM | POA: Diagnosis not present

## 2016-09-12 DIAGNOSIS — J45909 Unspecified asthma, uncomplicated: Secondary | ICD-10-CM | POA: Diagnosis not present

## 2016-09-13 DIAGNOSIS — J45909 Unspecified asthma, uncomplicated: Secondary | ICD-10-CM | POA: Diagnosis not present

## 2016-09-13 DIAGNOSIS — I10 Essential (primary) hypertension: Secondary | ICD-10-CM | POA: Diagnosis not present

## 2016-09-14 ENCOUNTER — Other Ambulatory Visit: Payer: Self-pay | Admitting: Internal Medicine

## 2016-09-14 DIAGNOSIS — M48062 Spinal stenosis, lumbar region with neurogenic claudication: Secondary | ICD-10-CM

## 2016-09-14 DIAGNOSIS — I10 Essential (primary) hypertension: Secondary | ICD-10-CM | POA: Diagnosis not present

## 2016-09-14 DIAGNOSIS — J45909 Unspecified asthma, uncomplicated: Secondary | ICD-10-CM | POA: Diagnosis not present

## 2016-09-15 DIAGNOSIS — J45909 Unspecified asthma, uncomplicated: Secondary | ICD-10-CM | POA: Diagnosis not present

## 2016-09-15 DIAGNOSIS — I10 Essential (primary) hypertension: Secondary | ICD-10-CM | POA: Diagnosis not present

## 2016-09-18 DIAGNOSIS — J45909 Unspecified asthma, uncomplicated: Secondary | ICD-10-CM | POA: Diagnosis not present

## 2016-09-18 DIAGNOSIS — I10 Essential (primary) hypertension: Secondary | ICD-10-CM | POA: Diagnosis not present

## 2016-09-19 DIAGNOSIS — I10 Essential (primary) hypertension: Secondary | ICD-10-CM | POA: Diagnosis not present

## 2016-09-19 DIAGNOSIS — J45909 Unspecified asthma, uncomplicated: Secondary | ICD-10-CM | POA: Diagnosis not present

## 2016-09-20 DIAGNOSIS — I10 Essential (primary) hypertension: Secondary | ICD-10-CM | POA: Diagnosis not present

## 2016-09-20 DIAGNOSIS — J45909 Unspecified asthma, uncomplicated: Secondary | ICD-10-CM | POA: Diagnosis not present

## 2016-09-21 DIAGNOSIS — I10 Essential (primary) hypertension: Secondary | ICD-10-CM | POA: Diagnosis not present

## 2016-09-21 DIAGNOSIS — J45909 Unspecified asthma, uncomplicated: Secondary | ICD-10-CM | POA: Diagnosis not present

## 2016-09-22 DIAGNOSIS — J45909 Unspecified asthma, uncomplicated: Secondary | ICD-10-CM | POA: Diagnosis not present

## 2016-09-22 DIAGNOSIS — I10 Essential (primary) hypertension: Secondary | ICD-10-CM | POA: Diagnosis not present

## 2016-09-25 DIAGNOSIS — J45909 Unspecified asthma, uncomplicated: Secondary | ICD-10-CM | POA: Diagnosis not present

## 2016-09-25 DIAGNOSIS — I10 Essential (primary) hypertension: Secondary | ICD-10-CM | POA: Diagnosis not present

## 2016-09-26 DIAGNOSIS — J45909 Unspecified asthma, uncomplicated: Secondary | ICD-10-CM | POA: Diagnosis not present

## 2016-09-26 DIAGNOSIS — I10 Essential (primary) hypertension: Secondary | ICD-10-CM | POA: Diagnosis not present

## 2016-09-27 DIAGNOSIS — J45909 Unspecified asthma, uncomplicated: Secondary | ICD-10-CM | POA: Diagnosis not present

## 2016-09-27 DIAGNOSIS — I10 Essential (primary) hypertension: Secondary | ICD-10-CM | POA: Diagnosis not present

## 2016-09-28 DIAGNOSIS — J45909 Unspecified asthma, uncomplicated: Secondary | ICD-10-CM | POA: Diagnosis not present

## 2016-09-28 DIAGNOSIS — I10 Essential (primary) hypertension: Secondary | ICD-10-CM | POA: Diagnosis not present

## 2016-09-29 DIAGNOSIS — J45909 Unspecified asthma, uncomplicated: Secondary | ICD-10-CM | POA: Diagnosis not present

## 2016-09-29 DIAGNOSIS — I10 Essential (primary) hypertension: Secondary | ICD-10-CM | POA: Diagnosis not present

## 2016-10-03 DIAGNOSIS — I10 Essential (primary) hypertension: Secondary | ICD-10-CM | POA: Diagnosis not present

## 2016-10-03 DIAGNOSIS — J45909 Unspecified asthma, uncomplicated: Secondary | ICD-10-CM | POA: Diagnosis not present

## 2016-10-04 DIAGNOSIS — I10 Essential (primary) hypertension: Secondary | ICD-10-CM | POA: Diagnosis not present

## 2016-10-04 DIAGNOSIS — J45909 Unspecified asthma, uncomplicated: Secondary | ICD-10-CM | POA: Diagnosis not present

## 2016-10-05 DIAGNOSIS — I10 Essential (primary) hypertension: Secondary | ICD-10-CM | POA: Diagnosis not present

## 2016-10-05 DIAGNOSIS — J45909 Unspecified asthma, uncomplicated: Secondary | ICD-10-CM | POA: Diagnosis not present

## 2016-10-06 DIAGNOSIS — I10 Essential (primary) hypertension: Secondary | ICD-10-CM | POA: Diagnosis not present

## 2016-10-06 DIAGNOSIS — J45909 Unspecified asthma, uncomplicated: Secondary | ICD-10-CM | POA: Diagnosis not present

## 2016-10-09 DIAGNOSIS — J45909 Unspecified asthma, uncomplicated: Secondary | ICD-10-CM | POA: Diagnosis not present

## 2016-10-09 DIAGNOSIS — I10 Essential (primary) hypertension: Secondary | ICD-10-CM | POA: Diagnosis not present

## 2016-10-10 DIAGNOSIS — I10 Essential (primary) hypertension: Secondary | ICD-10-CM | POA: Diagnosis not present

## 2016-10-10 DIAGNOSIS — J45909 Unspecified asthma, uncomplicated: Secondary | ICD-10-CM | POA: Diagnosis not present

## 2016-10-11 DIAGNOSIS — I10 Essential (primary) hypertension: Secondary | ICD-10-CM | POA: Diagnosis not present

## 2016-10-11 DIAGNOSIS — J45909 Unspecified asthma, uncomplicated: Secondary | ICD-10-CM | POA: Diagnosis not present

## 2016-10-12 DIAGNOSIS — I10 Essential (primary) hypertension: Secondary | ICD-10-CM | POA: Diagnosis not present

## 2016-10-12 DIAGNOSIS — J45909 Unspecified asthma, uncomplicated: Secondary | ICD-10-CM | POA: Diagnosis not present

## 2016-10-13 ENCOUNTER — Other Ambulatory Visit: Payer: Self-pay | Admitting: Internal Medicine

## 2016-10-13 DIAGNOSIS — J45909 Unspecified asthma, uncomplicated: Secondary | ICD-10-CM | POA: Diagnosis not present

## 2016-10-13 DIAGNOSIS — I10 Essential (primary) hypertension: Secondary | ICD-10-CM | POA: Diagnosis not present

## 2016-10-13 DIAGNOSIS — M1711 Unilateral primary osteoarthritis, right knee: Secondary | ICD-10-CM

## 2016-10-13 MED ORDER — OXYCODONE-ACETAMINOPHEN 10-325 MG PO TABS: 1.0000 | ORAL_TABLET | Freq: Four times a day (QID) | ORAL | 0 refills | Status: DC | PRN

## 2016-10-13 NOTE — Telephone Encounter (Signed)
Last visit:08/25/2016 Last UDS: 05/26/2016 Next appointment: 11/24/2016 Last written: 08/25/2016

## 2016-10-16 DIAGNOSIS — J45909 Unspecified asthma, uncomplicated: Secondary | ICD-10-CM | POA: Diagnosis not present

## 2016-10-16 DIAGNOSIS — I10 Essential (primary) hypertension: Secondary | ICD-10-CM | POA: Diagnosis not present

## 2016-10-17 DIAGNOSIS — J45909 Unspecified asthma, uncomplicated: Secondary | ICD-10-CM | POA: Diagnosis not present

## 2016-10-17 DIAGNOSIS — I10 Essential (primary) hypertension: Secondary | ICD-10-CM | POA: Diagnosis not present

## 2016-10-18 DIAGNOSIS — I10 Essential (primary) hypertension: Secondary | ICD-10-CM | POA: Diagnosis not present

## 2016-10-18 DIAGNOSIS — J45909 Unspecified asthma, uncomplicated: Secondary | ICD-10-CM | POA: Diagnosis not present

## 2016-10-19 DIAGNOSIS — J45909 Unspecified asthma, uncomplicated: Secondary | ICD-10-CM | POA: Diagnosis not present

## 2016-10-19 DIAGNOSIS — I10 Essential (primary) hypertension: Secondary | ICD-10-CM | POA: Diagnosis not present

## 2016-10-20 DIAGNOSIS — J45909 Unspecified asthma, uncomplicated: Secondary | ICD-10-CM | POA: Diagnosis not present

## 2016-10-20 DIAGNOSIS — I10 Essential (primary) hypertension: Secondary | ICD-10-CM | POA: Diagnosis not present

## 2016-10-23 DIAGNOSIS — J45909 Unspecified asthma, uncomplicated: Secondary | ICD-10-CM | POA: Diagnosis not present

## 2016-10-23 DIAGNOSIS — I10 Essential (primary) hypertension: Secondary | ICD-10-CM | POA: Diagnosis not present

## 2016-10-24 DIAGNOSIS — J45909 Unspecified asthma, uncomplicated: Secondary | ICD-10-CM | POA: Diagnosis not present

## 2016-10-24 DIAGNOSIS — I10 Essential (primary) hypertension: Secondary | ICD-10-CM | POA: Diagnosis not present

## 2016-10-25 DIAGNOSIS — J45909 Unspecified asthma, uncomplicated: Secondary | ICD-10-CM | POA: Diagnosis not present

## 2016-10-25 DIAGNOSIS — I10 Essential (primary) hypertension: Secondary | ICD-10-CM | POA: Diagnosis not present

## 2016-10-26 DIAGNOSIS — J45909 Unspecified asthma, uncomplicated: Secondary | ICD-10-CM | POA: Diagnosis not present

## 2016-10-26 DIAGNOSIS — I10 Essential (primary) hypertension: Secondary | ICD-10-CM | POA: Diagnosis not present

## 2016-10-27 DIAGNOSIS — J45909 Unspecified asthma, uncomplicated: Secondary | ICD-10-CM | POA: Diagnosis not present

## 2016-10-27 DIAGNOSIS — I10 Essential (primary) hypertension: Secondary | ICD-10-CM | POA: Diagnosis not present

## 2016-11-01 DIAGNOSIS — I10 Essential (primary) hypertension: Secondary | ICD-10-CM | POA: Diagnosis not present

## 2016-11-01 DIAGNOSIS — J45909 Unspecified asthma, uncomplicated: Secondary | ICD-10-CM | POA: Diagnosis not present

## 2016-11-02 DIAGNOSIS — J45909 Unspecified asthma, uncomplicated: Secondary | ICD-10-CM | POA: Diagnosis not present

## 2016-11-02 DIAGNOSIS — I10 Essential (primary) hypertension: Secondary | ICD-10-CM | POA: Diagnosis not present

## 2016-11-03 DIAGNOSIS — J45909 Unspecified asthma, uncomplicated: Secondary | ICD-10-CM | POA: Diagnosis not present

## 2016-11-03 DIAGNOSIS — I10 Essential (primary) hypertension: Secondary | ICD-10-CM | POA: Diagnosis not present

## 2016-11-07 DIAGNOSIS — J45909 Unspecified asthma, uncomplicated: Secondary | ICD-10-CM | POA: Diagnosis not present

## 2016-11-07 DIAGNOSIS — I10 Essential (primary) hypertension: Secondary | ICD-10-CM | POA: Diagnosis not present

## 2016-11-08 DIAGNOSIS — I10 Essential (primary) hypertension: Secondary | ICD-10-CM | POA: Diagnosis not present

## 2016-11-08 DIAGNOSIS — J45909 Unspecified asthma, uncomplicated: Secondary | ICD-10-CM | POA: Diagnosis not present

## 2016-11-09 DIAGNOSIS — J45909 Unspecified asthma, uncomplicated: Secondary | ICD-10-CM | POA: Diagnosis not present

## 2016-11-09 DIAGNOSIS — I10 Essential (primary) hypertension: Secondary | ICD-10-CM | POA: Diagnosis not present

## 2016-11-10 DIAGNOSIS — J45909 Unspecified asthma, uncomplicated: Secondary | ICD-10-CM | POA: Diagnosis not present

## 2016-11-10 DIAGNOSIS — I10 Essential (primary) hypertension: Secondary | ICD-10-CM | POA: Diagnosis not present

## 2016-11-13 ENCOUNTER — Other Ambulatory Visit: Payer: Self-pay | Admitting: Internal Medicine

## 2016-11-13 DIAGNOSIS — I10 Essential (primary) hypertension: Secondary | ICD-10-CM | POA: Diagnosis not present

## 2016-11-13 DIAGNOSIS — M1711 Unilateral primary osteoarthritis, right knee: Secondary | ICD-10-CM

## 2016-11-13 DIAGNOSIS — J45909 Unspecified asthma, uncomplicated: Secondary | ICD-10-CM | POA: Diagnosis not present

## 2016-11-13 NOTE — Telephone Encounter (Signed)
Ms. [name] is correct regarding prescriptions I had handed him in early March.  I did write for additional prescriptions on 10/13/2016 with the last one reading do not refill prior to 12/13/2016.  He either picked them up or they may be in his folder.  Thanks.

## 2016-11-13 NOTE — Telephone Encounter (Signed)
Last office visit: 08/25/2016 Last UDS: 05/26/2016 Last Refill: pt given 3 rxs during 08/25/16 visit with pcp to get him through mid May. Next appt: 11/24/2016

## 2016-11-14 DIAGNOSIS — J45909 Unspecified asthma, uncomplicated: Secondary | ICD-10-CM | POA: Diagnosis not present

## 2016-11-14 DIAGNOSIS — I10 Essential (primary) hypertension: Secondary | ICD-10-CM | POA: Diagnosis not present

## 2016-11-14 NOTE — Telephone Encounter (Signed)
Attempted to contact pt back regarding rx, no answer-message left on recorder.  Pts pharmacy was also contacted -I was informed that they already have the rx on file waiting to be filled.  No further action needed, phone call complete.Despina Hidden Cassady5/22/20184:41 PM

## 2016-11-15 DIAGNOSIS — I10 Essential (primary) hypertension: Secondary | ICD-10-CM | POA: Diagnosis not present

## 2016-11-15 DIAGNOSIS — J45909 Unspecified asthma, uncomplicated: Secondary | ICD-10-CM | POA: Diagnosis not present

## 2016-11-16 DIAGNOSIS — I10 Essential (primary) hypertension: Secondary | ICD-10-CM | POA: Diagnosis not present

## 2016-11-16 DIAGNOSIS — J45909 Unspecified asthma, uncomplicated: Secondary | ICD-10-CM | POA: Diagnosis not present

## 2016-11-17 DIAGNOSIS — I10 Essential (primary) hypertension: Secondary | ICD-10-CM | POA: Diagnosis not present

## 2016-11-17 DIAGNOSIS — J45909 Unspecified asthma, uncomplicated: Secondary | ICD-10-CM | POA: Diagnosis not present

## 2016-11-20 DIAGNOSIS — J45909 Unspecified asthma, uncomplicated: Secondary | ICD-10-CM | POA: Diagnosis not present

## 2016-11-20 DIAGNOSIS — I10 Essential (primary) hypertension: Secondary | ICD-10-CM | POA: Diagnosis not present

## 2016-11-21 DIAGNOSIS — J45909 Unspecified asthma, uncomplicated: Secondary | ICD-10-CM | POA: Diagnosis not present

## 2016-11-21 DIAGNOSIS — I10 Essential (primary) hypertension: Secondary | ICD-10-CM | POA: Diagnosis not present

## 2016-11-22 ENCOUNTER — Encounter: Payer: Self-pay | Admitting: Podiatry

## 2016-11-22 ENCOUNTER — Ambulatory Visit (INDEPENDENT_AMBULATORY_CARE_PROVIDER_SITE_OTHER): Payer: Medicare HMO | Admitting: Podiatry

## 2016-11-22 DIAGNOSIS — M79676 Pain in unspecified toe(s): Secondary | ICD-10-CM

## 2016-11-22 DIAGNOSIS — Q828 Other specified congenital malformations of skin: Secondary | ICD-10-CM

## 2016-11-22 DIAGNOSIS — I10 Essential (primary) hypertension: Secondary | ICD-10-CM | POA: Diagnosis not present

## 2016-11-22 DIAGNOSIS — J45909 Unspecified asthma, uncomplicated: Secondary | ICD-10-CM | POA: Diagnosis not present

## 2016-11-22 DIAGNOSIS — B351 Tinea unguium: Secondary | ICD-10-CM

## 2016-11-22 NOTE — Progress Notes (Signed)
Patient ID: [name], male   DOB: 07/22/1945, 71 y.o.   MRN: [id number]    Subjective: This patient presents today again complaining of elongated, thickened toenails and multiple painful plantar skin lesions right and left feet and is requesting debridement of these areas  Objective: Orientated 3 DP pulses 2/4 bilaterally PT pulses 1/4 bilaterally Sensation to 10 g monofilament wire intact 4/5 bilaterally Vibratory sensation nonreactive bilaterally Ankle reflexes nonreactive bilaterally Walker required for ambulation Dorsi flexion, plantar flexion 5/5 bilaterally Pes planus bilaterally No open skin lesions bilaterally Partial sloughing of second left toenail with dry crusted nailbed without any surrounding erythema, edema, warmth, drainage The toenails are brittle, hypertrophic, deformed, discolored and tender to palpation 6-10 Multiple nucleated plantar keratoses 6   Assessment: Symptomatic onychomycoses 6-10 Porokeratosis 6  Plan: Debridement toenails 10 mechanically and electrically without any bleeding Debrided porokeratosis 6 without any bleeding  Reappoint 3 months 

## 2016-11-23 DIAGNOSIS — J45909 Unspecified asthma, uncomplicated: Secondary | ICD-10-CM | POA: Diagnosis not present

## 2016-11-23 DIAGNOSIS — I10 Essential (primary) hypertension: Secondary | ICD-10-CM | POA: Diagnosis not present

## 2016-11-24 ENCOUNTER — Encounter: Payer: Self-pay | Admitting: Internal Medicine

## 2016-11-24 ENCOUNTER — Ambulatory Visit: Payer: Medicare HMO | Admitting: Internal Medicine

## 2016-11-24 DIAGNOSIS — I10 Essential (primary) hypertension: Secondary | ICD-10-CM | POA: Diagnosis not present

## 2016-11-24 DIAGNOSIS — J45909 Unspecified asthma, uncomplicated: Secondary | ICD-10-CM | POA: Diagnosis not present

## 2016-11-27 DIAGNOSIS — J45909 Unspecified asthma, uncomplicated: Secondary | ICD-10-CM | POA: Diagnosis not present

## 2016-11-27 DIAGNOSIS — I10 Essential (primary) hypertension: Secondary | ICD-10-CM | POA: Diagnosis not present

## 2016-11-28 DIAGNOSIS — J45909 Unspecified asthma, uncomplicated: Secondary | ICD-10-CM | POA: Diagnosis not present

## 2016-11-28 DIAGNOSIS — I10 Essential (primary) hypertension: Secondary | ICD-10-CM | POA: Diagnosis not present

## 2016-11-29 ENCOUNTER — Ambulatory Visit (INDEPENDENT_AMBULATORY_CARE_PROVIDER_SITE_OTHER): Payer: Medicare HMO | Admitting: Internal Medicine

## 2016-11-29 ENCOUNTER — Encounter: Payer: Self-pay | Admitting: Internal Medicine

## 2016-11-29 ENCOUNTER — Ambulatory Visit (HOSPITAL_COMMUNITY)
Admission: RE | Admit: 2016-11-29 | Discharge: 2016-11-29 | Disposition: A | Discharge status: Home / Self Care | Payer: Medicare HMO | Source: Ambulatory Visit | Attending: Internal Medicine | Admitting: Internal Medicine

## 2016-11-29 DIAGNOSIS — W19XXXA Unspecified fall, initial encounter: Secondary | ICD-10-CM

## 2016-11-29 DIAGNOSIS — I4581 Long QT syndrome: Secondary | ICD-10-CM | POA: Diagnosis not present

## 2016-11-29 DIAGNOSIS — Z9181 History of falling: Secondary | ICD-10-CM | POA: Insufficient documentation

## 2016-11-29 DIAGNOSIS — J45909 Unspecified asthma, uncomplicated: Secondary | ICD-10-CM | POA: Diagnosis not present

## 2016-11-29 DIAGNOSIS — K219 Gastro-esophageal reflux disease without esophagitis: Secondary | ICD-10-CM | POA: Diagnosis not present

## 2016-11-29 DIAGNOSIS — I10 Essential (primary) hypertension: Secondary | ICD-10-CM | POA: Diagnosis not present

## 2016-11-29 NOTE — Patient Instructions (Signed)
Mr. Rubens it was nice meeting you today.  I would advise you to abstain from drinking alcoholic beverages.  Please call the clinic or seek immediate medical attention if your chest pain becomes worse or you start having difficulty breathing.

## 2016-11-30 DIAGNOSIS — I10 Essential (primary) hypertension: Secondary | ICD-10-CM | POA: Diagnosis not present

## 2016-11-30 DIAGNOSIS — J45909 Unspecified asthma, uncomplicated: Secondary | ICD-10-CM | POA: Diagnosis not present

## 2016-11-30 NOTE — Progress Notes (Signed)
   CC: Patient is presenting to the clinic after recent fall.  HPI:  ShawnShawn [name] is a 71 y.o. male with a past medical history of conditions listed below presenting to the clinic after a recent fall. Please see problem based charting for the status of the patient's current and chronic medical conditions.   Past Medical History:  Diagnosis Date  . 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  Excellent symptomatic response to LES Botox injections.   . AVM (arteriovenous malformation) of colon 08/26/2013   Cecum X 3, without bleeding on colonoscopy February 2015   . B12 deficiency 10/01/2011   Discovered to 2 progressive neurologic pain and dysfunction.  Diagnosis established March 2013.  Requires the following treatment: B12 IM monthly until level normalizes. After that, oral supplementation.   . Cataract   . Chronic venous insufficiency 11/28/2013  . Constipation due to opioid therapy 03/11/2015  . Diverticulosis 08/26/2013  . Essential hypertension 06/15/2006  . Family history of malignant neoplasm of gastrointestinal tract 06/30/2013   Father with colon cancer age 37   . Gastroesophageal reflux disease 02/04/2007  . Internal hemorrhoids 08/26/2013  . Left posterior subcapsular cataract 10/03/2013   s/p yag laser capsulotomy 10/03/2013   . 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  Complicated by chronic low back pain and neuropathy.  Nerve conduction study (10/19/11): Diffusely low motor amplitudes, with primarily involvement of the peroneal and posterior tibial nerves. No evidence of generalized peripheral neuropathy. Findings could be c/w bil multilevel lumbosacral radiculopathies or a primary motor neuropathy.   . Mild aortic valve stenosis 10/09/2006   Echo  (12/09/2009): Valve area: 2.03 cm2   . Neuropathy   . Obesity, Class I, BMI 30-34.9 04/05/2012  . Onychomycosis of toenail 06/05/2014  . Osteoarthritis 11/25/2009   Right knee (medial compartment), right hip, left shoulder   . 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 Trigger little finger of right hand 03/11/2015    Review of Systems: Pertinent positives mentioned in HPI. Remainder of all ROS negative.   Physical Exam:  Vitals:   11/29/16 1317  BP: (!) 143/76  Pulse: 80  Temp: 98.1 F (36.7 C)  TempSrc: Oral  SpO2: 100%  Weight: 262 lb 9.6 oz (119.1 kg)   Physical Exam  Constitutional: He is oriented to person, place, and time. He appears well-developed and well-nourished. No distress.  HENT:  Head: Normocephalic and atraumatic.  Eyes: Right eye exhibits no discharge. Left eye exhibits no discharge.  Neck: Neck supple. No tracheal deviation present.  Cardiovascular: Normal rate, regular rhythm and intact distal pulses.   Pulmonary/Chest: Effort normal and breath sounds normal. No respiratory distress. He has no wheezes. He has no rales.  Abdominal: Soft. Bowel sounds are normal. He exhibits no distension. There is no tenderness.  Musculoskeletal: He exhibits no edema.  No tenderness on palpation of the sternum and ribs.  Neurological: He is alert and oriented to person, place, and time.  Skin: Skin is warm and dry.    Assessment & Plan:   See Encounters Tab for problem based charting.  Patient discussed with Dr. Lynnae January

## 2016-11-30 NOTE — Assessment & Plan Note (Addendum)
History of present illness Patient reports having a fall at home 2 days ago. Denies having any injuries from the fall. States he was using his walker to ambulate in the house and somehow fell. States EMS was called and he was found to have a low blood sugar which improved after he ate something. Patient never went to the hospital. States he has been vomiting for a week prior to this episode and had not been able to keep food down. Also reports drinking an entire bottle of brandy 2 days prior to this episode. States he does not drink alcohol regularly otherwise. Reports having worsening GERD symptoms and epigastric pain prior to this episode. Denies having any diarrhea or constipation. Does report having chronic chest pain which occurs only at night. Patient was not able to tell me how long he has had chest pain, character of the pain, or how long it lasts. Does state that sometimes he experiences diaphoresis and shortness of breath at the time of the chest pain. Denies having any chest pain during the day and he does not believe that the chest pain is exertional. States all his symptoms have now finally resolved after he consumed soda yesterday and has been able to eat well since then. Denies having any dysphagia or odynophagia.  Assessment Patient's vomiting has now resolved, it could have possibly been secondary to viral gastroenteritis. Although, he does mention having epigastric pain and worsening GERD symptoms the week prior to this episode. He does have a history of achalasia but that would not explain worsening GERD symptoms or chest pain. He denied having any dysphagia or odynophagia. I am concerned about his chest pain. Patient is not able to give me any further details in the history. Per chart review, he had a normal cath in April 2003 and a negative Myoview in 2008. EKG checked at this visit without any acute ischemic changes. Chest pain could likely be related to the gastrointestinal system (GERD,  DES), however, the presence of associated shortness of breath and diaphoresis point more toward a cardiovascular cause.   Plan -Continue PPI -Advised him to abstain from drinking alcoholic beverages -Advised patient to call the clinic or seek immediate medical attention  if he experiences worsening chest pain or shortness of breath in the future.

## 2016-11-30 NOTE — Progress Notes (Signed)
Internal Medicine Clinic Attending  Case discussed with Dr. Rathoreat the time of the visit. We reviewed the resident's history and exam and pertinent patient test results. I agree with the assessment, diagnosis, and plan of care documented in the resident's note.  

## 2016-12-01 DIAGNOSIS — I10 Essential (primary) hypertension: Secondary | ICD-10-CM | POA: Diagnosis not present

## 2016-12-01 DIAGNOSIS — J45909 Unspecified asthma, uncomplicated: Secondary | ICD-10-CM | POA: Diagnosis not present

## 2016-12-04 ENCOUNTER — Other Ambulatory Visit: Payer: Self-pay | Admitting: Internal Medicine

## 2016-12-04 DIAGNOSIS — I10 Essential (primary) hypertension: Secondary | ICD-10-CM | POA: Diagnosis not present

## 2016-12-04 DIAGNOSIS — M1711 Unilateral primary osteoarthritis, right knee: Secondary | ICD-10-CM

## 2016-12-04 DIAGNOSIS — J45909 Unspecified asthma, uncomplicated: Secondary | ICD-10-CM | POA: Diagnosis not present

## 2016-12-05 DIAGNOSIS — I10 Essential (primary) hypertension: Secondary | ICD-10-CM | POA: Diagnosis not present

## 2016-12-05 DIAGNOSIS — J45909 Unspecified asthma, uncomplicated: Secondary | ICD-10-CM | POA: Diagnosis not present

## 2016-12-06 DIAGNOSIS — I10 Essential (primary) hypertension: Secondary | ICD-10-CM | POA: Diagnosis not present

## 2016-12-06 DIAGNOSIS — J45909 Unspecified asthma, uncomplicated: Secondary | ICD-10-CM | POA: Diagnosis not present

## 2016-12-07 DIAGNOSIS — J45909 Unspecified asthma, uncomplicated: Secondary | ICD-10-CM | POA: Diagnosis not present

## 2016-12-07 DIAGNOSIS — I10 Essential (primary) hypertension: Secondary | ICD-10-CM | POA: Diagnosis not present

## 2016-12-08 DIAGNOSIS — J45909 Unspecified asthma, uncomplicated: Secondary | ICD-10-CM | POA: Diagnosis not present

## 2016-12-08 DIAGNOSIS — I10 Essential (primary) hypertension: Secondary | ICD-10-CM | POA: Diagnosis not present

## 2016-12-11 ENCOUNTER — Other Ambulatory Visit: Payer: Self-pay | Admitting: Internal Medicine

## 2016-12-11 DIAGNOSIS — I10 Essential (primary) hypertension: Secondary | ICD-10-CM | POA: Diagnosis not present

## 2016-12-11 DIAGNOSIS — M1711 Unilateral primary osteoarthritis, right knee: Secondary | ICD-10-CM

## 2016-12-11 DIAGNOSIS — J45909 Unspecified asthma, uncomplicated: Secondary | ICD-10-CM | POA: Diagnosis not present

## 2016-12-12 DIAGNOSIS — I10 Essential (primary) hypertension: Secondary | ICD-10-CM | POA: Diagnosis not present

## 2016-12-12 DIAGNOSIS — J45909 Unspecified asthma, uncomplicated: Secondary | ICD-10-CM | POA: Diagnosis not present

## 2016-12-13 DIAGNOSIS — J45909 Unspecified asthma, uncomplicated: Secondary | ICD-10-CM | POA: Diagnosis not present

## 2016-12-13 DIAGNOSIS — I10 Essential (primary) hypertension: Secondary | ICD-10-CM | POA: Diagnosis not present

## 2016-12-14 DIAGNOSIS — I10 Essential (primary) hypertension: Secondary | ICD-10-CM | POA: Diagnosis not present

## 2016-12-14 DIAGNOSIS — J45909 Unspecified asthma, uncomplicated: Secondary | ICD-10-CM | POA: Diagnosis not present

## 2016-12-14 MED ORDER — OXYCODONE-ACETAMINOPHEN 10-325 MG PO TABS: 1.0000 | ORAL_TABLET | Freq: Four times a day (QID) | ORAL | 0 refills | Status: DC | PRN

## 2016-12-14 NOTE — Telephone Encounter (Signed)
RX last written 10/13/16. Last appt 12/04/16 ; no f/u appt. UDS 05/26/16.

## 2016-12-15 ENCOUNTER — Other Ambulatory Visit: Payer: Self-pay | Admitting: Internal Medicine

## 2016-12-15 DIAGNOSIS — M1711 Unilateral primary osteoarthritis, right knee: Secondary | ICD-10-CM

## 2016-12-18 DIAGNOSIS — J45909 Unspecified asthma, uncomplicated: Secondary | ICD-10-CM | POA: Diagnosis not present

## 2016-12-18 DIAGNOSIS — I10 Essential (primary) hypertension: Secondary | ICD-10-CM | POA: Diagnosis not present

## 2016-12-19 DIAGNOSIS — I10 Essential (primary) hypertension: Secondary | ICD-10-CM | POA: Diagnosis not present

## 2016-12-19 DIAGNOSIS — J45909 Unspecified asthma, uncomplicated: Secondary | ICD-10-CM | POA: Diagnosis not present

## 2016-12-19 NOTE — Telephone Encounter (Signed)
rx ready for pick up, pt aware.[name], Shawn Bohlman Cassady6/26/201811:26 AM

## 2016-12-20 DIAGNOSIS — I10 Essential (primary) hypertension: Secondary | ICD-10-CM | POA: Diagnosis not present

## 2016-12-20 DIAGNOSIS — J45909 Unspecified asthma, uncomplicated: Secondary | ICD-10-CM | POA: Diagnosis not present

## 2016-12-21 DIAGNOSIS — J45909 Unspecified asthma, uncomplicated: Secondary | ICD-10-CM | POA: Diagnosis not present

## 2016-12-21 DIAGNOSIS — I10 Essential (primary) hypertension: Secondary | ICD-10-CM | POA: Diagnosis not present

## 2016-12-22 DIAGNOSIS — I10 Essential (primary) hypertension: Secondary | ICD-10-CM | POA: Diagnosis not present

## 2016-12-22 DIAGNOSIS — J45909 Unspecified asthma, uncomplicated: Secondary | ICD-10-CM | POA: Diagnosis not present

## 2016-12-25 ENCOUNTER — Other Ambulatory Visit: Payer: Self-pay | Admitting: *Deleted

## 2016-12-25 DIAGNOSIS — I10 Essential (primary) hypertension: Secondary | ICD-10-CM | POA: Diagnosis not present

## 2016-12-25 DIAGNOSIS — J45909 Unspecified asthma, uncomplicated: Secondary | ICD-10-CM | POA: Diagnosis not present

## 2016-12-25 DIAGNOSIS — M1711 Unilateral primary osteoarthritis, right knee: Secondary | ICD-10-CM

## 2016-12-25 MED ORDER — OXYCODONE-ACETAMINOPHEN 10-325 MG PO TABS: 1.0000 | ORAL_TABLET | Freq: Four times a day (QID) | ORAL | 0 refills | Status: DC | PRN

## 2016-12-25 NOTE — Telephone Encounter (Signed)
Pt stated he did not get Percocet rx for June when he picked up 3 scripts on 6/28. The 3 scripts were for July ,Aug ,Sept. I called [facility]  - confirmed the 3 rxs on filed. Stated last refill was 5/21 and July rx cannot be filled until 7/20. Pt stated he only has 2 pills left. Thanks

## 2016-12-25 NOTE — Telephone Encounter (Signed)
It appears prescription was lost and per the [facility] it has not been filled.  I will therefore provide #40 tablets to get him to July 20th. The pharmacy has the next 3 prescriptions in their possession.

## 2016-12-26 DIAGNOSIS — I10 Essential (primary) hypertension: Secondary | ICD-10-CM | POA: Diagnosis not present

## 2016-12-26 DIAGNOSIS — J45909 Unspecified asthma, uncomplicated: Secondary | ICD-10-CM | POA: Diagnosis not present

## 2016-12-26 NOTE — Telephone Encounter (Signed)
Pt contacted-rx ready for pick-up.[name], [name]7/3/20182:00 PM

## 2016-12-28 ENCOUNTER — Ambulatory Visit (INDEPENDENT_AMBULATORY_CARE_PROVIDER_SITE_OTHER): Payer: Medicare HMO | Admitting: Internal Medicine

## 2016-12-28 VITALS — BP 144/85 | HR 72 | Temp 98.2°F | Ht 74.0 in | Wt 275.2 lb

## 2016-12-28 DIAGNOSIS — I10 Essential (primary) hypertension: Secondary | ICD-10-CM | POA: Diagnosis not present

## 2016-12-28 DIAGNOSIS — Z87891 Personal history of nicotine dependence: Secondary | ICD-10-CM | POA: Diagnosis not present

## 2016-12-28 DIAGNOSIS — K220 Achalasia of cardia: Secondary | ICD-10-CM

## 2016-12-28 DIAGNOSIS — J45909 Unspecified asthma, uncomplicated: Secondary | ICD-10-CM | POA: Diagnosis not present

## 2016-12-28 DIAGNOSIS — Z79899 Other long term (current) drug therapy: Secondary | ICD-10-CM | POA: Diagnosis not present

## 2016-12-28 DIAGNOSIS — I872 Venous insufficiency (chronic) (peripheral): Secondary | ICD-10-CM | POA: Diagnosis not present

## 2016-12-28 DIAGNOSIS — K219 Gastro-esophageal reflux disease without esophagitis: Secondary | ICD-10-CM

## 2016-12-28 DIAGNOSIS — I119 Hypertensive heart disease without heart failure: Secondary | ICD-10-CM

## 2016-12-28 MED ORDER — HYDROCHLOROTHIAZIDE 25 MG PO TABS: 25.0000 mg | ORAL_TABLET | Freq: Every day | ORAL | 3 refills | Status: DC

## 2016-12-28 NOTE — Assessment & Plan Note (Signed)
The patient endorsed worsening lower extremity swelling over the past few months and had symptoms of volume overload on physical exam today, including 1+ pitting edema. After medication review and a call to the patient's pharmacy it was discovered that the patient is no longer taking HCTZ, a medication that he had taken in the past to help with lower extremity swelling and blood pressure management. The patient's last BMP in 08/2016 showed good renal function and the patient was restarted on HCTZ, 25 mg daily. He will follow up in clinic in 3 months to reassess swelling and BP.

## 2016-12-28 NOTE — Assessment & Plan Note (Addendum)
The patient's SBP was slightly above goal of 140 at the clinic visit today. The patient's medications were reviewed with his pharmacist to ensure that he is taking all appropriate medications, as the patient was unable to name all medications he takes on a daily basis. Given the elevated SBP and 1+pitting edema on PE, the patient was restarted on HCTZ at a dose of 25 mg daily. He took this medication in the past for BP management and per chart review it was well tolerated. It is suspected that this medicine will help with both SBP management and his symptoms of volume overload. He will follow up in 3 months to reassess his BP.   There was much confusion in the clinic over what medications the patient is actually taking. To assist with medication management, the patient was referred to the clinical pharmacist who will follow up in 1 month to ensure patient is taking all medications as prescribed.

## 2016-12-28 NOTE — Assessment & Plan Note (Signed)
The patient expressed improvement in his chest pain with change in diet and use of Prilosec. The patient endorses occasional chest pain while sleeping at night and after eating certain foods, consistent with GERD. Since the patient still has symptoms, he will continue taking Prilosec 20 mg daily until this problem resolves. He will follow up in 3 months to reassess.

## 2016-12-28 NOTE — Patient Instructions (Addendum)
Thanks for coming in today.  Please start taking hydrochlorothiazide, 25 mg once a day. This will help with your leg swelling.   You will follow up in 1 month with your pharmacist to discuss your medications.  You will follow up in 3 months with you PCP to talk about your HTN, GERD, leg swelling, and other medical problems.

## 2016-12-28 NOTE — Progress Notes (Signed)
CC: Hypertension follow up  HPI:  [name] is a 71 y.o. 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 The patient has felt well since his last visit 1 month ago and denies any recent falls. He says the chest pain described at his previous visit has greatly improved with use of Prilosec daily. He states that he does not feel this pain during the day and only occasionally feels this chest pain at night. When this pain occurs at night it is quickly relieved by sitting up. He notices this pain occurs with certain foods and he has been trying to cut them out of his diet. He denies SOB at rest and with exertion, N/V, diaphoresis, and fevers. The patient does endorse lower extremity swelling, which he says has been a problem for him for a few months. He does not know what causes the swelling but he is concerned that it is not going away.   He states that he is able to take his medications every day with the help of a nurses aide that works at the facility where he lives. He expresses some confusion over what medications he is actually currently taking but states that she puts them in an easy to understand pill box so he knows what to take during the week.   Past Medical History:  Diagnosis Date  . 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  Excellent symptomatic response to LES Botox injections.   . AVM (arteriovenous malformation) of colon 08/26/2013   Cecum X 3, without bleeding on colonoscopy February 2015   . B12 deficiency 10/01/2011   Discovered to 2 progressive neurologic pain and dysfunction.  Diagnosis established March 2013.  Requires the following treatment: B12 IM monthly until level normalizes. After that, oral supplementation.   . Cataract   . Chronic venous insufficiency 11/28/2013  .  Constipation due to opioid therapy 03/11/2015  . Diverticulosis 08/26/2013  . Essential hypertension 06/15/2006  . Family history of malignant neoplasm of gastrointestinal tract 06/30/2013   Father with colon cancer age 84   . Gastroesophageal reflux disease 02/04/2007  . Internal hemorrhoids 08/26/2013  . Left posterior subcapsular cataract 10/03/2013   s/p yag laser capsulotomy 10/03/2013   . 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  Complicated by chronic low back pain and neuropathy.  Nerve conduction study (10/19/11): Diffusely low motor amplitudes, with primarily involvement of the peroneal and posterior tibial nerves. No evidence of generalized peripheral neuropathy. Findings could be c/w bil multilevel lumbosacral radiculopathies or a primary motor neuropathy.   . Mild aortic valve stenosis 10/09/2006   Echo (12/09/2009): Valve area: 2.03 cm2   . Neuropathy   . Obesity, Class I, BMI 30-34.9 04/05/2012  . Onychomycosis of toenail 06/05/2014  . Osteoarthritis 11/25/2009   Right knee (medial compartment), right hip, left shoulder   . 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 Trigger little finger of right hand 03/11/2015   Review of Systems:  Patient denies weight loss, abdominal pain, change in bowel and bladder movements, and chills.  Patient endorses lower extremity swelling. See HPI for additional detail.   Physical Exam:  Vitals:   12/28/16 1417  BP: (!) 144/85  Pulse: 72  Temp: 98.2 F (36.8 C)  TempSrc: Oral  SpO2: 100%  Weight: 275 lb 3.2 oz (124.8 kg)  Height: 6\' 2"  (1.88 m)   Physical Exam  Cardiovascular: Normal rate, regular rhythm, normal heart sounds and intact distal pulses.   No murmur heard. Pulmonary/Chest: Effort normal and breath sounds normal. No respiratory  distress. He has no wheezes.  No crackles appreciated.  Abdominal: Soft. He exhibits no distension. There is no tenderness.  Musculoskeletal: He exhibits edema (1+ pitting edema to knees bilaterally).  Skin: Skin is warm and dry. Capillary refill takes less than 2 seconds.    Assessment & Plan:   See Encounters Tab for problem based charting.  Patient seen with Dr. Angelia Mould.

## 2016-12-29 DIAGNOSIS — I10 Essential (primary) hypertension: Secondary | ICD-10-CM | POA: Diagnosis not present

## 2016-12-29 DIAGNOSIS — J45909 Unspecified asthma, uncomplicated: Secondary | ICD-10-CM | POA: Diagnosis not present

## 2017-01-01 DIAGNOSIS — J45909 Unspecified asthma, uncomplicated: Secondary | ICD-10-CM | POA: Diagnosis not present

## 2017-01-01 DIAGNOSIS — I10 Essential (primary) hypertension: Secondary | ICD-10-CM | POA: Diagnosis not present

## 2017-01-02 DIAGNOSIS — J45909 Unspecified asthma, uncomplicated: Secondary | ICD-10-CM | POA: Diagnosis not present

## 2017-01-02 DIAGNOSIS — I10 Essential (primary) hypertension: Secondary | ICD-10-CM | POA: Diagnosis not present

## 2017-01-02 NOTE — Progress Notes (Signed)
Internal Medicine Clinic Attending  I saw and evaluated the patient.  I personally confirmed the key portions of the history and exam documented by Dr. Nedrud and I reviewed pertinent patient test results.  The assessment, diagnosis, and plan were formulated together and I agree with the documentation in the resident's note.  

## 2017-01-03 DIAGNOSIS — I10 Essential (primary) hypertension: Secondary | ICD-10-CM | POA: Diagnosis not present

## 2017-01-03 DIAGNOSIS — J45909 Unspecified asthma, uncomplicated: Secondary | ICD-10-CM | POA: Diagnosis not present

## 2017-01-04 DIAGNOSIS — I10 Essential (primary) hypertension: Secondary | ICD-10-CM | POA: Diagnosis not present

## 2017-01-04 DIAGNOSIS — J45909 Unspecified asthma, uncomplicated: Secondary | ICD-10-CM | POA: Diagnosis not present

## 2017-01-05 DIAGNOSIS — I10 Essential (primary) hypertension: Secondary | ICD-10-CM | POA: Diagnosis not present

## 2017-01-05 DIAGNOSIS — J45909 Unspecified asthma, uncomplicated: Secondary | ICD-10-CM | POA: Diagnosis not present

## 2017-01-08 DIAGNOSIS — J45909 Unspecified asthma, uncomplicated: Secondary | ICD-10-CM | POA: Diagnosis not present

## 2017-01-08 DIAGNOSIS — I10 Essential (primary) hypertension: Secondary | ICD-10-CM | POA: Diagnosis not present

## 2017-01-09 DIAGNOSIS — I10 Essential (primary) hypertension: Secondary | ICD-10-CM | POA: Diagnosis not present

## 2017-01-09 DIAGNOSIS — J45909 Unspecified asthma, uncomplicated: Secondary | ICD-10-CM | POA: Diagnosis not present

## 2017-01-10 DIAGNOSIS — J45909 Unspecified asthma, uncomplicated: Secondary | ICD-10-CM | POA: Diagnosis not present

## 2017-01-10 DIAGNOSIS — I10 Essential (primary) hypertension: Secondary | ICD-10-CM | POA: Diagnosis not present

## 2017-01-11 DIAGNOSIS — J45909 Unspecified asthma, uncomplicated: Secondary | ICD-10-CM | POA: Diagnosis not present

## 2017-01-11 DIAGNOSIS — I10 Essential (primary) hypertension: Secondary | ICD-10-CM | POA: Diagnosis not present

## 2017-01-12 DIAGNOSIS — J45909 Unspecified asthma, uncomplicated: Secondary | ICD-10-CM | POA: Diagnosis not present

## 2017-01-12 DIAGNOSIS — I10 Essential (primary) hypertension: Secondary | ICD-10-CM | POA: Diagnosis not present

## 2017-01-15 DIAGNOSIS — I10 Essential (primary) hypertension: Secondary | ICD-10-CM | POA: Diagnosis not present

## 2017-01-15 DIAGNOSIS — J45909 Unspecified asthma, uncomplicated: Secondary | ICD-10-CM | POA: Diagnosis not present

## 2017-01-16 ENCOUNTER — Telehealth: Payer: Self-pay | Admitting: Pharmacist

## 2017-01-16 DIAGNOSIS — J45909 Unspecified asthma, uncomplicated: Secondary | ICD-10-CM | POA: Diagnosis not present

## 2017-01-16 DIAGNOSIS — I10 Essential (primary) hypertension: Secondary | ICD-10-CM | POA: Diagnosis not present

## 2017-01-17 DIAGNOSIS — J45909 Unspecified asthma, uncomplicated: Secondary | ICD-10-CM | POA: Diagnosis not present

## 2017-01-17 DIAGNOSIS — I10 Essential (primary) hypertension: Secondary | ICD-10-CM | POA: Diagnosis not present

## 2017-01-17 NOTE — Progress Notes (Signed)
Follow up on patient referral for help with hypertension management. Front desk successfully scheduled patient appointment.

## 2017-01-18 DIAGNOSIS — I10 Essential (primary) hypertension: Secondary | ICD-10-CM | POA: Diagnosis not present

## 2017-01-18 DIAGNOSIS — J45909 Unspecified asthma, uncomplicated: Secondary | ICD-10-CM | POA: Diagnosis not present

## 2017-01-19 DIAGNOSIS — I10 Essential (primary) hypertension: Secondary | ICD-10-CM | POA: Diagnosis not present

## 2017-01-19 DIAGNOSIS — J45909 Unspecified asthma, uncomplicated: Secondary | ICD-10-CM | POA: Diagnosis not present

## 2017-01-22 DIAGNOSIS — J45909 Unspecified asthma, uncomplicated: Secondary | ICD-10-CM | POA: Diagnosis not present

## 2017-01-22 DIAGNOSIS — I10 Essential (primary) hypertension: Secondary | ICD-10-CM | POA: Diagnosis not present

## 2017-01-24 ENCOUNTER — Encounter: Payer: Self-pay | Admitting: Pharmacist

## 2017-01-24 ENCOUNTER — Ambulatory Visit (INDEPENDENT_AMBULATORY_CARE_PROVIDER_SITE_OTHER): Payer: Medicare HMO | Admitting: Pharmacist

## 2017-01-24 VITALS — BP 107/55 | HR 61

## 2017-01-24 DIAGNOSIS — Z7189 Other specified counseling: Secondary | ICD-10-CM

## 2017-01-24 DIAGNOSIS — Z888 Allergy status to other drugs, medicaments and biological substances status: Secondary | ICD-10-CM | POA: Diagnosis not present

## 2017-01-24 DIAGNOSIS — I10 Essential (primary) hypertension: Secondary | ICD-10-CM

## 2017-01-24 DIAGNOSIS — J45909 Unspecified asthma, uncomplicated: Secondary | ICD-10-CM | POA: Diagnosis not present

## 2017-01-24 DIAGNOSIS — Z79899 Other long term (current) drug therapy: Secondary | ICD-10-CM | POA: Diagnosis not present

## 2017-01-24 NOTE — Patient Instructions (Signed)
Patient educated about medication as defined in this encounter and verbalized understanding by repeating back instructions provided.   

## 2017-01-24 NOTE — Progress Notes (Addendum)
S: [name] is a 71 y.o. male reports to clinical pharmacist appointment for hypertension medication management per physician referral. Patient presents by himself and did bring medication bottles.  Allergies  Allergen Reactions  . Amlodipine Swelling    Bilateral lower extremity swelling   Medication Sig  aspirin 81 MG EC tablet Take 81 mg by mouth daily.    COMBIGAN 0.2-0.5 % ophthalmic solution Place 1 drop into both eyes 2 (two) times daily.  gabapentin (NEURONTIN) 300 MG capsule Take 2 capsules (600 mg total) by mouth 3 (three) times daily.  hydrochlorothiazide (HYDRODIURIL) 25 MG tablet Take 1 tablet (25 mg total) by mouth daily.  ibuprofen (IBU) 800 MG tablet Take 1 tablet (800 mg total) by mouth every 8 (eight) hours as needed for moderate pain.  omeprazole (PRILOSEC) 20 MG capsule Take 1 capsule (20 mg total) by mouth daily.  oxyCODONE-acetaminophen (PERCOCET) 10-325 MG tablet Take 1 tablet by mouth every 6 (six) hours as needed for pain.  sorbitol 70 % solution Take 15 mLs by mouth daily as needed.  verapamil (CALAN-SR) 180 MG CR tablet Take 1 tablet (180 mg total) by mouth daily.  vitamin B-12 (CYANOCOBALAMIN) 1000 MCG tablet Take 1 tablet (1,000 mcg total) by mouth daily.   Past Medical History:  Diagnosis Date  . 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  Excellent symptomatic response to LES Botox injections.   . AVM (arteriovenous malformation) of colon 08/26/2013   Cecum X 3, without bleeding on colonoscopy February 2015   . B12 deficiency 10/01/2011   Discovered to 2 progressive neurologic pain and dysfunction.  Diagnosis established March 2013.  Requires the following treatment: B12 IM monthly until level normalizes. After that, oral supplementation.   . Cataract   . Chronic venous insufficiency 11/28/2013  . Constipation due to opioid therapy 03/11/2015  . Diverticulosis 08/26/2013   . Essential hypertension 06/15/2006  . Family history of malignant neoplasm of gastrointestinal tract 06/30/2013   Father with colon cancer age 106   . Gastroesophageal reflux disease 02/04/2007  . Internal hemorrhoids 08/26/2013  . Left posterior subcapsular cataract 10/03/2013   s/p yag laser capsulotomy 10/03/2013   . 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  Complicated by chronic low back pain and neuropathy.  Nerve conduction study (10/19/11): Diffusely low motor amplitudes, with primarily involvement of the peroneal and posterior tibial nerves. No evidence of generalized peripheral neuropathy. Findings could be c/w bil multilevel lumbosacral radiculopathies or a primary motor neuropathy.   . Mild aortic valve stenosis 10/09/2006   Echo (12/09/2009): Valve area: 2.03 cm2   . Neuropathy   . Obesity, Class I, BMI 30-34.9 04/05/2012  . Onychomycosis of toenail 06/05/2014  . Osteoarthritis 11/25/2009   Right knee (medial compartment), right hip, left shoulder   . 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 Trigger little finger of right hand 03/11/2015   Social History   Social History  . Marital status: Single    Spouse name: N/A  . Number of children: N/A  . Years of education: N/A   Social History Main Topics  . Smoking status: Former Smoker    Packs/day: 0.50    Years: 30.00    Types: Cigarettes    Quit date: 11/05/2010  .  Smokeless tobacco: Never Used  . Alcohol use 0.0 oz/week     Comment: every now and then  . Drug use: No  . Sexual activity: No     Comment: quit in 2014   Other Topics Concern  . None   Social History Narrative  . None   Family History  Problem Relation Age of Onset  . Heart disease Mother   . Colon cancer Father 87  . Kidney disease Brother        on  dialysis  . Pancreatic cancer Brother   . Hypertension Sister   . Diabetes Sister   . Kidney disease Brother        on dialysis  . Diabetes Sister   . Cancer Daughter 80       Died at age of 33, unknown type  . Anesthesia problems Neg Hx    O: Component Value Date/Time   CHOL 181 04/25/2013 1123   HDL 46 04/25/2013 1123   TRIG 156 (H) 04/25/2013 1123   AST 18 11/21/2012 1054   ALT 13 11/21/2012 1054   NA 145 (H) 08/25/2016 1139   K 4.2 08/25/2016 1139   CL 101 08/25/2016 1139   CO2 18 08/25/2016 1139   GLUCOSE 86 08/25/2016 1139   GLUCOSE 92 07/01/2013 1300   HGBA1C (H) 11/14/2010 0138    5.9 (NOTE)                                                                       According to the ADA Clinical Practice Recommendations for 2011, when HbA1c is used as a screening test:   >=6.5%   Diagnostic of Diabetes Mellitus           (if abnormal result  is confirmed)  5.7-6.4%   Increased risk of developing Diabetes Mellitus  References:Diagnosis and Classification of Diabetes Mellitus,Diabetes PRFF,6384,66(ZLDJT 1):S62-S69 and Standards of Medical Care in         Diabetes - 2011,Diabetes Care,2011,34  (Suppl 1):S11-S61.   BUN 8 08/25/2016 1139   CREATININE 1.03 08/25/2016 1139   CREATININE 0.90 11/21/2012 1054   CALCIUM 9.8 08/25/2016 1139   GFRAA 83 08/25/2016 1139   GFRAA >89 11/21/2012 1054   WBC 5.1 07/01/2013 1300   HGB 13.6 07/01/2013 1300   HCT 39.4 07/01/2013 1300   PLT 215 07/01/2013 1300   TSH 0.644 12/09/2009 2147   Ht Readings from Last 2 Encounters:  12/28/16 6\' 2"  (1.88 m)  02/25/16 6\' 2"  (1.88 m)   Wt Readings from Last 2 Encounters:  12/28/16 275 lb 3.2 oz (124.8 kg)  11/29/16 262 lb 9.6 oz (119.1 kg)   There is no height or weight on file to calculate BMI. BP Readings from Last 3 Encounters:  01/24/17 (!) 107/55  12/28/16 (!) 144/85  11/29/16 (!) 143/76   A/P:  At 12/28/16 appointment, patient presented with 1+ pitting edema and elevated BP. HCTZ 25 mg  was initiated. Patient states he is tolerating therapy will, no side effects reported today. Medications were reviewed with the patient, including name, instructions, indication, goals of therapy, potential side effects, importance of adherence, and safe use. BP at goal today, slightly low. Patient reports no symptoms of concern. No changes recommended at  this time. If symptoms arise or BP continuously low at follow up, can consider reducing verapamil to 120 mg daily and or reducing HCTZ to 12.5 mg daily. An after visit summary was provided and patient advised to follow up if any changes in condition or questions regarding medications arise. PCP appointment scheduled on 03/23/17. The patient verbalized understanding of information provided by repeating back concepts discussed. Of note, patient was a bit difficult to understand during our discussion. In the future, he may benefit from further reinforcement of information reviewed today.   15 minutes spent face-to-face with the patient during the encounter. 75% of time spent on education. 25% of time was spent on assessment and plan.

## 2017-01-25 DIAGNOSIS — I10 Essential (primary) hypertension: Secondary | ICD-10-CM | POA: Diagnosis not present

## 2017-01-25 DIAGNOSIS — J45909 Unspecified asthma, uncomplicated: Secondary | ICD-10-CM | POA: Diagnosis not present

## 2017-01-26 DIAGNOSIS — I10 Essential (primary) hypertension: Secondary | ICD-10-CM | POA: Diagnosis not present

## 2017-01-26 DIAGNOSIS — J45909 Unspecified asthma, uncomplicated: Secondary | ICD-10-CM | POA: Diagnosis not present

## 2017-01-29 DIAGNOSIS — J45909 Unspecified asthma, uncomplicated: Secondary | ICD-10-CM | POA: Diagnosis not present

## 2017-01-29 DIAGNOSIS — I10 Essential (primary) hypertension: Secondary | ICD-10-CM | POA: Diagnosis not present

## 2017-01-30 DIAGNOSIS — I10 Essential (primary) hypertension: Secondary | ICD-10-CM | POA: Diagnosis not present

## 2017-01-30 DIAGNOSIS — J45909 Unspecified asthma, uncomplicated: Secondary | ICD-10-CM | POA: Diagnosis not present

## 2017-01-31 DIAGNOSIS — J45909 Unspecified asthma, uncomplicated: Secondary | ICD-10-CM | POA: Diagnosis not present

## 2017-01-31 DIAGNOSIS — I10 Essential (primary) hypertension: Secondary | ICD-10-CM | POA: Diagnosis not present

## 2017-02-01 DIAGNOSIS — I10 Essential (primary) hypertension: Secondary | ICD-10-CM | POA: Diagnosis not present

## 2017-02-01 DIAGNOSIS — J45909 Unspecified asthma, uncomplicated: Secondary | ICD-10-CM | POA: Diagnosis not present

## 2017-02-02 DIAGNOSIS — J45909 Unspecified asthma, uncomplicated: Secondary | ICD-10-CM | POA: Diagnosis not present

## 2017-02-02 DIAGNOSIS — I10 Essential (primary) hypertension: Secondary | ICD-10-CM | POA: Diagnosis not present

## 2017-02-06 DIAGNOSIS — I10 Essential (primary) hypertension: Secondary | ICD-10-CM | POA: Diagnosis not present

## 2017-02-06 DIAGNOSIS — J45909 Unspecified asthma, uncomplicated: Secondary | ICD-10-CM | POA: Diagnosis not present

## 2017-02-07 DIAGNOSIS — J45909 Unspecified asthma, uncomplicated: Secondary | ICD-10-CM | POA: Diagnosis not present

## 2017-02-07 DIAGNOSIS — I10 Essential (primary) hypertension: Secondary | ICD-10-CM | POA: Diagnosis not present

## 2017-02-08 DIAGNOSIS — J45909 Unspecified asthma, uncomplicated: Secondary | ICD-10-CM | POA: Diagnosis not present

## 2017-02-08 DIAGNOSIS — I10 Essential (primary) hypertension: Secondary | ICD-10-CM | POA: Diagnosis not present

## 2017-02-09 DIAGNOSIS — I10 Essential (primary) hypertension: Secondary | ICD-10-CM | POA: Diagnosis not present

## 2017-02-09 DIAGNOSIS — J45909 Unspecified asthma, uncomplicated: Secondary | ICD-10-CM | POA: Diagnosis not present

## 2017-02-12 ENCOUNTER — Other Ambulatory Visit: Payer: Self-pay | Admitting: Internal Medicine

## 2017-02-12 DIAGNOSIS — I10 Essential (primary) hypertension: Secondary | ICD-10-CM

## 2017-02-12 DIAGNOSIS — J45909 Unspecified asthma, uncomplicated: Secondary | ICD-10-CM | POA: Diagnosis not present

## 2017-02-13 DIAGNOSIS — J45909 Unspecified asthma, uncomplicated: Secondary | ICD-10-CM | POA: Diagnosis not present

## 2017-02-13 DIAGNOSIS — I10 Essential (primary) hypertension: Secondary | ICD-10-CM | POA: Diagnosis not present

## 2017-02-14 ENCOUNTER — Encounter: Payer: Self-pay | Admitting: Podiatry

## 2017-02-14 ENCOUNTER — Ambulatory Visit (INDEPENDENT_AMBULATORY_CARE_PROVIDER_SITE_OTHER): Payer: Medicare HMO | Admitting: Podiatry

## 2017-02-14 DIAGNOSIS — B351 Tinea unguium: Secondary | ICD-10-CM | POA: Diagnosis not present

## 2017-02-14 DIAGNOSIS — I10 Essential (primary) hypertension: Secondary | ICD-10-CM | POA: Diagnosis not present

## 2017-02-14 DIAGNOSIS — J45909 Unspecified asthma, uncomplicated: Secondary | ICD-10-CM | POA: Diagnosis not present

## 2017-02-14 DIAGNOSIS — M79676 Pain in unspecified toe(s): Secondary | ICD-10-CM

## 2017-02-14 DIAGNOSIS — Q828 Other specified congenital malformations of skin: Secondary | ICD-10-CM

## 2017-02-14 NOTE — Progress Notes (Signed)
Patient ID: [name], male   DOB: [birth year], 71 y.o.   MRN: [id number]    Subjective: This patient presents today again complaining of elongated, thickened toenails and multiple painful plantar skin lesions right and left feet and is requesting debridement of these areas  Objective: Orientated 3 DP pulses 2/4 bilaterally PT pulses 1/4 bilaterally Sensation to 10 g monofilament wire intact 4/5 bilaterally Vibratory sensation nonreactive bilaterally Ankle reflexes nonreactive bilaterally Walker required for ambulation Dorsi flexion, plantar flexion 5/5 bilaterally Pes planus bilaterally No open skin lesions bilaterally Partial sloughing of second left toenail with dry crusted nailbed without any surrounding erythema, edema, warmth, drainage The toenails are brittle, hypertrophic, deformed, discolored and tender to palpation 6-10 Multiple nucleated plantar keratoses 6   Assessment: Symptomatic onychomycoses 6-10 Porokeratosis 6  Plan: Debridement toenails 10 mechanically and electrically without any bleeding Debrided porokeratosis 6 without any bleeding  Reappoint 3 months

## 2017-02-15 DIAGNOSIS — J45909 Unspecified asthma, uncomplicated: Secondary | ICD-10-CM | POA: Diagnosis not present

## 2017-02-15 DIAGNOSIS — I10 Essential (primary) hypertension: Secondary | ICD-10-CM | POA: Diagnosis not present

## 2017-02-16 DIAGNOSIS — J45909 Unspecified asthma, uncomplicated: Secondary | ICD-10-CM | POA: Diagnosis not present

## 2017-02-16 DIAGNOSIS — I10 Essential (primary) hypertension: Secondary | ICD-10-CM | POA: Diagnosis not present

## 2017-02-19 DIAGNOSIS — J45909 Unspecified asthma, uncomplicated: Secondary | ICD-10-CM | POA: Diagnosis not present

## 2017-02-19 DIAGNOSIS — I10 Essential (primary) hypertension: Secondary | ICD-10-CM | POA: Diagnosis not present

## 2017-02-21 ENCOUNTER — Ambulatory Visit: Payer: Medicare HMO | Admitting: Podiatry

## 2017-02-21 DIAGNOSIS — I10 Essential (primary) hypertension: Secondary | ICD-10-CM | POA: Diagnosis not present

## 2017-02-21 DIAGNOSIS — J45909 Unspecified asthma, uncomplicated: Secondary | ICD-10-CM | POA: Diagnosis not present

## 2017-02-22 DIAGNOSIS — H401122 Primary open-angle glaucoma, left eye, moderate stage: Secondary | ICD-10-CM | POA: Diagnosis not present

## 2017-02-22 DIAGNOSIS — I10 Essential (primary) hypertension: Secondary | ICD-10-CM | POA: Diagnosis not present

## 2017-02-22 DIAGNOSIS — J45909 Unspecified asthma, uncomplicated: Secondary | ICD-10-CM | POA: Diagnosis not present

## 2017-02-22 DIAGNOSIS — H33021 Retinal detachment with multiple breaks, right eye: Secondary | ICD-10-CM | POA: Diagnosis not present

## 2017-02-22 DIAGNOSIS — H401113 Primary open-angle glaucoma, right eye, severe stage: Secondary | ICD-10-CM | POA: Diagnosis not present

## 2017-02-22 DIAGNOSIS — H209 Unspecified iridocyclitis: Secondary | ICD-10-CM | POA: Diagnosis not present

## 2017-02-26 DIAGNOSIS — J45909 Unspecified asthma, uncomplicated: Secondary | ICD-10-CM | POA: Diagnosis not present

## 2017-02-26 DIAGNOSIS — I10 Essential (primary) hypertension: Secondary | ICD-10-CM | POA: Diagnosis not present

## 2017-02-27 DIAGNOSIS — J45909 Unspecified asthma, uncomplicated: Secondary | ICD-10-CM | POA: Diagnosis not present

## 2017-02-27 DIAGNOSIS — I10 Essential (primary) hypertension: Secondary | ICD-10-CM | POA: Diagnosis not present

## 2017-02-28 DIAGNOSIS — I10 Essential (primary) hypertension: Secondary | ICD-10-CM | POA: Diagnosis not present

## 2017-02-28 DIAGNOSIS — J45909 Unspecified asthma, uncomplicated: Secondary | ICD-10-CM | POA: Diagnosis not present

## 2017-03-01 DIAGNOSIS — J45909 Unspecified asthma, uncomplicated: Secondary | ICD-10-CM | POA: Diagnosis not present

## 2017-03-01 DIAGNOSIS — I10 Essential (primary) hypertension: Secondary | ICD-10-CM | POA: Diagnosis not present

## 2017-03-02 DIAGNOSIS — J45909 Unspecified asthma, uncomplicated: Secondary | ICD-10-CM | POA: Diagnosis not present

## 2017-03-02 DIAGNOSIS — I10 Essential (primary) hypertension: Secondary | ICD-10-CM | POA: Diagnosis not present

## 2017-03-05 DIAGNOSIS — J45909 Unspecified asthma, uncomplicated: Secondary | ICD-10-CM | POA: Diagnosis not present

## 2017-03-05 DIAGNOSIS — I10 Essential (primary) hypertension: Secondary | ICD-10-CM | POA: Diagnosis not present

## 2017-03-06 DIAGNOSIS — J45909 Unspecified asthma, uncomplicated: Secondary | ICD-10-CM | POA: Diagnosis not present

## 2017-03-06 DIAGNOSIS — I10 Essential (primary) hypertension: Secondary | ICD-10-CM | POA: Diagnosis not present

## 2017-03-07 DIAGNOSIS — J45909 Unspecified asthma, uncomplicated: Secondary | ICD-10-CM | POA: Diagnosis not present

## 2017-03-07 DIAGNOSIS — I10 Essential (primary) hypertension: Secondary | ICD-10-CM | POA: Diagnosis not present

## 2017-03-08 DIAGNOSIS — J45909 Unspecified asthma, uncomplicated: Secondary | ICD-10-CM | POA: Diagnosis not present

## 2017-03-08 DIAGNOSIS — I10 Essential (primary) hypertension: Secondary | ICD-10-CM | POA: Diagnosis not present

## 2017-03-09 DIAGNOSIS — J45909 Unspecified asthma, uncomplicated: Secondary | ICD-10-CM | POA: Diagnosis not present

## 2017-03-09 DIAGNOSIS — I10 Essential (primary) hypertension: Secondary | ICD-10-CM | POA: Diagnosis not present

## 2017-03-12 DIAGNOSIS — J45909 Unspecified asthma, uncomplicated: Secondary | ICD-10-CM | POA: Diagnosis not present

## 2017-03-12 DIAGNOSIS — I10 Essential (primary) hypertension: Secondary | ICD-10-CM | POA: Diagnosis not present

## 2017-03-13 DIAGNOSIS — I10 Essential (primary) hypertension: Secondary | ICD-10-CM | POA: Diagnosis not present

## 2017-03-13 DIAGNOSIS — J45909 Unspecified asthma, uncomplicated: Secondary | ICD-10-CM | POA: Diagnosis not present

## 2017-03-14 DIAGNOSIS — I10 Essential (primary) hypertension: Secondary | ICD-10-CM | POA: Diagnosis not present

## 2017-03-14 DIAGNOSIS — J45909 Unspecified asthma, uncomplicated: Secondary | ICD-10-CM | POA: Diagnosis not present

## 2017-03-15 DIAGNOSIS — I10 Essential (primary) hypertension: Secondary | ICD-10-CM | POA: Diagnosis not present

## 2017-03-15 DIAGNOSIS — J45909 Unspecified asthma, uncomplicated: Secondary | ICD-10-CM | POA: Diagnosis not present

## 2017-03-16 DIAGNOSIS — I10 Essential (primary) hypertension: Secondary | ICD-10-CM | POA: Diagnosis not present

## 2017-03-16 DIAGNOSIS — J45909 Unspecified asthma, uncomplicated: Secondary | ICD-10-CM | POA: Diagnosis not present

## 2017-03-19 ENCOUNTER — Other Ambulatory Visit: Payer: Self-pay | Admitting: Internal Medicine

## 2017-03-19 DIAGNOSIS — I10 Essential (primary) hypertension: Secondary | ICD-10-CM | POA: Diagnosis not present

## 2017-03-19 DIAGNOSIS — K219 Gastro-esophageal reflux disease without esophagitis: Secondary | ICD-10-CM

## 2017-03-19 DIAGNOSIS — J45909 Unspecified asthma, uncomplicated: Secondary | ICD-10-CM | POA: Diagnosis not present

## 2017-03-20 DIAGNOSIS — J45909 Unspecified asthma, uncomplicated: Secondary | ICD-10-CM | POA: Diagnosis not present

## 2017-03-20 DIAGNOSIS — I10 Essential (primary) hypertension: Secondary | ICD-10-CM | POA: Diagnosis not present

## 2017-03-21 DIAGNOSIS — J45909 Unspecified asthma, uncomplicated: Secondary | ICD-10-CM | POA: Diagnosis not present

## 2017-03-21 DIAGNOSIS — I10 Essential (primary) hypertension: Secondary | ICD-10-CM | POA: Diagnosis not present

## 2017-03-22 DIAGNOSIS — I10 Essential (primary) hypertension: Secondary | ICD-10-CM | POA: Diagnosis not present

## 2017-03-22 DIAGNOSIS — J45909 Unspecified asthma, uncomplicated: Secondary | ICD-10-CM | POA: Diagnosis not present

## 2017-03-23 ENCOUNTER — Encounter: Payer: Self-pay | Admitting: Internal Medicine

## 2017-03-23 ENCOUNTER — Ambulatory Visit (INDEPENDENT_AMBULATORY_CARE_PROVIDER_SITE_OTHER): Payer: Medicare HMO | Admitting: Internal Medicine

## 2017-03-23 VITALS — BP 130/69 | HR 65 | Temp 98.2°F | Wt 276.1 lb

## 2017-03-23 DIAGNOSIS — I10 Essential (primary) hypertension: Secondary | ICD-10-CM

## 2017-03-23 DIAGNOSIS — R011 Cardiac murmur, unspecified: Secondary | ICD-10-CM | POA: Diagnosis not present

## 2017-03-23 DIAGNOSIS — K219 Gastro-esophageal reflux disease without esophagitis: Secondary | ICD-10-CM | POA: Diagnosis not present

## 2017-03-23 DIAGNOSIS — I119 Hypertensive heart disease without heart failure: Secondary | ICD-10-CM

## 2017-03-23 DIAGNOSIS — Z23 Encounter for immunization: Secondary | ICD-10-CM

## 2017-03-23 DIAGNOSIS — M48062 Spinal stenosis, lumbar region with neurogenic claudication: Secondary | ICD-10-CM

## 2017-03-23 DIAGNOSIS — Z79891 Long term (current) use of opiate analgesic: Secondary | ICD-10-CM

## 2017-03-23 DIAGNOSIS — M1711 Unilateral primary osteoarthritis, right knee: Secondary | ICD-10-CM | POA: Diagnosis not present

## 2017-03-23 DIAGNOSIS — K5903 Drug induced constipation: Secondary | ICD-10-CM | POA: Diagnosis not present

## 2017-03-23 DIAGNOSIS — J45909 Unspecified asthma, uncomplicated: Secondary | ICD-10-CM | POA: Diagnosis not present

## 2017-03-23 DIAGNOSIS — I872 Venous insufficiency (chronic) (peripheral): Secondary | ICD-10-CM | POA: Diagnosis not present

## 2017-03-23 DIAGNOSIS — E538 Deficiency of other specified B group vitamins: Secondary | ICD-10-CM | POA: Diagnosis not present

## 2017-03-23 DIAGNOSIS — Z6835 Body mass index (BMI) 35.0-35.9, adult: Secondary | ICD-10-CM

## 2017-03-23 DIAGNOSIS — H33001 Unspecified retinal detachment with retinal break, right eye: Secondary | ICD-10-CM

## 2017-03-23 DIAGNOSIS — T402X5A Adverse effect of other opioids, initial encounter: Secondary | ICD-10-CM | POA: Diagnosis not present

## 2017-03-23 DIAGNOSIS — Z79899 Other long term (current) drug therapy: Secondary | ICD-10-CM

## 2017-03-23 DIAGNOSIS — K220 Achalasia of cardia: Secondary | ICD-10-CM

## 2017-03-23 DIAGNOSIS — E669 Obesity, unspecified: Secondary | ICD-10-CM

## 2017-03-23 DIAGNOSIS — Z87891 Personal history of nicotine dependence: Secondary | ICD-10-CM

## 2017-03-23 MED ORDER — IBUPROFEN 800 MG PO TABS: 800.0000 mg | ORAL_TABLET | Freq: Three times a day (TID) | ORAL | 11 refills | Status: DC | PRN

## 2017-03-23 MED ORDER — SORBITOL 70 % PO SOLN: 15.0000 mL | Freq: Every day | ORAL | 3 refills | Status: AC | PRN

## 2017-03-23 MED ORDER — OXYCODONE-ACETAMINOPHEN 10-325 MG PO TABS: 1.0000 | ORAL_TABLET | Freq: Four times a day (QID) | ORAL | 0 refills | Status: DC | PRN

## 2017-03-23 NOTE — Assessment & Plan Note (Signed)
Assessment  His blood pressure today was well within target at 130/69. This is on hydrochlorothiazide 25 mg by mouth daily and verapamil 180 mg by mouth daily. He is tolerating these medications well. A basic metabolic panel has been obtained and is pending at the time of this dictation.  Plan  We will continue the hydrochlorothiazide at 25 mg by mouth daily and verapamil at 180 mg by mouth daily. We will follow-up on the results of the basic metabolic panel when available. We will reassess the blood pressure control on this regimen at the follow-up visit.

## 2017-03-23 NOTE — Progress Notes (Signed)
   Subjective:    Patient ID: [name], male    DOB: Oct 01, 1945, 71 y.o.   MRN: [id number]  HPI  [name] is here for essential hypertension, right knee osteoarthritis, chronic venous insufficiency, achalasia, gastroesophageal reflux disease, class II obesity, lumbar stenosis, and vitamin B12 deficiency. Please see the A&P for the status of the pt's chronic medical problems.  Review of Systems  Constitutional: Negative for activity change, appetite change and unexpected weight change.  HENT: Positive for trouble swallowing. Negative for drooling.   Respiratory: Negative for chest tightness, shortness of breath and wheezing.   Cardiovascular: Positive for leg swelling. Negative for chest pain and palpitations.  Gastrointestinal: Positive for constipation. Negative for abdominal distention, abdominal pain, diarrhea, nausea and vomiting.  Musculoskeletal: Positive for arthralgias and back pain. Negative for joint swelling and myalgias.  Skin: Negative for color change, rash and wound.  Neurological: Negative for dizziness, syncope and light-headedness.      Objective:   Physical Exam  Constitutional: He is oriented to person, place, and time. He appears well-developed and well-nourished. No distress.  HENT:  Head: Normocephalic and atraumatic.  Eyes: Conjunctivae are normal. Right eye exhibits no discharge. Left eye exhibits no discharge. No scleral icterus.  Cardiovascular: Normal rate and regular rhythm.  Exam reveals no gallop and no friction rub.   Murmur heard. Pulmonary/Chest: Effort normal and breath sounds normal. No respiratory distress. He has no wheezes. He has no rales.  Abdominal: Soft. Bowel sounds are normal. He exhibits no distension. There is no tenderness. There is no rebound and no guarding.  Musculoskeletal: Normal range of motion. He exhibits edema. He exhibits no tenderness or deformity.  Neurological: He is alert and oriented to person, place, and  time. He exhibits normal muscle tone.  Skin: Skin is warm and dry. No rash noted. He is not diaphoretic. No erythema.  Psychiatric: He has a normal mood and affect. His behavior is normal. Judgment and thought content normal.  Nursing note and vitals reviewed.     Assessment & Plan:   Please see problem oriented charting.

## 2017-03-23 NOTE — Assessment & Plan Note (Signed)
He received the flu vaccination today. Otherwise he is up-to-date on his health care maintenance. 

## 2017-03-23 NOTE — Assessment & Plan Note (Addendum)
Assessment  He continues to have lower extremity swelling but it is improved since restarting the hydrochlorothiazide 25 mg by mouth daily. He finds it very difficult to put on the compression stockings although his home health aide will help him with this, but then she leaves before these can be removed and this does cause a another bit of difficulty.  Plan  We will continue the hydrochlorothiazide 25 mg by mouth daily for the chronic venous insufficiency. He was encouraged to keep his legs elevated when not ambulating. We will reassess the chronic venous insufficiency edema at the follow-up visit.

## 2017-03-23 NOTE — Assessment & Plan Note (Signed)
Assessment  He currently is not complaining of significant constipation although is requesting a refill of his sorbitol which she found to be quite effective.  Plan  Will reorder sorbitol 70% 1-5 tablespoons in coffee each morning adjusted to the desired number of bowel movements per day. We will reassess the efficacy of this therapy at the follow-up visit.

## 2017-03-23 NOTE — Assessment & Plan Note (Signed)
Assessment  Despite the surgical intervention of his right eye retinal detachment he has noted no improvement in his vision.  Plan  It was recommended he follow-up with his eye doctor. I'm hopeful they will discuss the specifics of the eyedrops that he requires moving forward to clarify this for [place].

## 2017-03-23 NOTE — Assessment & Plan Note (Signed)
Assessment  His gastroesophageal reflux symptoms are well controlled on omeprazole 20 mg by mouth daily.  Plan  We will continue the omeprazole 20 m by mouth daily and reassess his gastroesophageal reflux symptomatology at the follow-up visit.

## 2017-03-23 NOTE — Assessment & Plan Note (Signed)
Assessment  We are continuing to address his left ventricular hypertrophy secondary to hypertensive disease by aggressively treating his hypertension. He is currently on hydrochlorothiazide 25 mg by mouth daily and verapamil 180 mg by mouth daily.  Plan  We will continue hydrochlorothiazide at 25 mg by mouth daily and verapamil 180 mg by mouth daily. We will reassess the control of the hypertension, and therefore our ability to prevent further worsening of his left ventricular hypertrophy, at the follow-up visit.

## 2017-03-23 NOTE — Assessment & Plan Note (Signed)
Assessment  He continues to take his oral vitamin B12 tablet as directed. He is without signs or symptoms suggestive of B12 deficiency.  Plan  We will assess a B12 level with MMA level at the follow-up visit to make sure he is adequately repleted with oral supplementation.

## 2017-03-23 NOTE — Assessment & Plan Note (Signed)
Assessment  Over the last 3 months his weight has increased just under 1 pound, and is relatively stable after a recent 9 pound weight loss. He was praised for his ability to maintain the weight loss.  Plan  We will reassess his weight at the follow-up visit. We are hopeful that with current portion control he will start losing weight once again. This will be important given his chronic back pain and more importantly his right knee osteoarthritis.

## 2017-03-23 NOTE — Assessment & Plan Note (Signed)
Assessment  His back pain is reasonably well-controlled on as needed Percocet 10-325 mg 1 tablet every 6 hours as needed for pain dispense #75 per month. With this therapy he is able to perform his activities of daily living.  Plan  We will continue the Percocet 10-325 mg 1 tablet every 6 hours as needed for pain dispense #75 per month. We will reassess the efficacy of this therapy on his functional status at the follow-up visit. He was given 2 additional written prescriptions for this to add to the 1 prescription he has at home. With this he should have a enough Percocet with refills to get him through December.

## 2017-03-23 NOTE — Assessment & Plan Note (Signed)
Assessment  He states that over the last several months he has required numerous swallows to get a food bolus down at times. He doubts that the food is actually sticking, it just is not moving as easily as in the past. I am not sure if this is recurrence of his achalasia, but I believe it does require reassessment.  Plan  I will refer him back to GI to reassess for recurrence of achalasia and requirement of repeat Botox therapy.

## 2017-03-23 NOTE — Assessment & Plan Note (Signed)
Assessment  His right knee osteoarthritis pain is reasonably well controlled on ibuprofen 800 mg by mouth every 8 hours as needed for pain. Occasionally he will require Percocet 10-325 mg 1 tablet by mouth every 6 hours as needed dispense #75 per month. With this therapy he is able to perform his activities of daily living.  Plan  We will continue the ibuprofen at 800 mg by mouth every 8 hours as needed for pain. He also has available Percocet 10-325 mg 1 tablet by mouth every 6 hours as needed dispense #75 per month.

## 2017-03-23 NOTE — Patient Instructions (Addendum)
It was good to see you again today.  You are doing a good job with your health.  1) I will put in a request to get a taller walker.  2) I will send you back to the GI doctor to reassess your swallowing.  3) We gave you 2 prescriptions for percocet to add to the one you have at home.  This should get you through December.  4) We gave you a flu shot today.  5) Keep taking your other medications as you are.  I refilled the ibuprofen and the sorbitol.  6) We drew blood work today.  I will send you a letter with the results.  I will see you back in 3 months, sooner if necessary.

## 2017-03-24 LAB — BMP8+ANION GAP
ANION GAP: 14 mmol/L (ref 10.0–18.0)
BUN/Creatinine Ratio: 13 (ref 10–24)
BUN: 15 mg/dL (ref 8–27)
CALCIUM: 9.8 mg/dL (ref 8.6–10.2)
CO2: 26 mmol/L (ref 20–29)
CREATININE: 1.2 mg/dL (ref 0.76–1.27)
Chloride: 103 mmol/L (ref 96–106)
GFR calc Af Amer: 69 mL/min/{1.73_m2} (ref >59)
GFR calc non Af Amer: 60 mL/min/{1.73_m2} (ref >59)
GLUCOSE: 69 mg/dL (ref 65–99)
Potassium: 4.3 mmol/L (ref 3.5–5.2)
SODIUM: 143 mmol/L (ref 134–144)

## 2017-03-26 ENCOUNTER — Telehealth: Payer: Self-pay | Admitting: Internal Medicine

## 2017-03-26 DIAGNOSIS — I10 Essential (primary) hypertension: Secondary | ICD-10-CM | POA: Diagnosis not present

## 2017-03-26 DIAGNOSIS — J45909 Unspecified asthma, uncomplicated: Secondary | ICD-10-CM | POA: Diagnosis not present

## 2017-03-26 DIAGNOSIS — R269 Unspecified abnormalities of gait and mobility: Secondary | ICD-10-CM

## 2017-03-27 DIAGNOSIS — J45909 Unspecified asthma, uncomplicated: Secondary | ICD-10-CM | POA: Diagnosis not present

## 2017-03-27 DIAGNOSIS — I10 Essential (primary) hypertension: Secondary | ICD-10-CM | POA: Diagnosis not present

## 2017-03-28 DIAGNOSIS — J45909 Unspecified asthma, uncomplicated: Secondary | ICD-10-CM | POA: Diagnosis not present

## 2017-03-28 DIAGNOSIS — I10 Essential (primary) hypertension: Secondary | ICD-10-CM | POA: Diagnosis not present

## 2017-03-29 ENCOUNTER — Telehealth: Payer: Self-pay

## 2017-03-29 DIAGNOSIS — J45909 Unspecified asthma, uncomplicated: Secondary | ICD-10-CM | POA: Diagnosis not present

## 2017-03-29 DIAGNOSIS — I10 Essential (primary) hypertension: Secondary | ICD-10-CM | POA: Diagnosis not present

## 2017-03-29 NOTE — Telephone Encounter (Signed)
Order have been faxed to [facility] for SN/PT for gait work with a new walker. Phone [phone]  Fax Number [phone] Per Area Manager Gerda Diss.

## 2017-03-29 NOTE — Telephone Encounter (Signed)
Faxed [facility] form 03/29/2017.

## 2017-03-30 ENCOUNTER — Telehealth: Payer: Self-pay

## 2017-03-30 DIAGNOSIS — I10 Essential (primary) hypertension: Secondary | ICD-10-CM | POA: Diagnosis not present

## 2017-03-30 DIAGNOSIS — J45909 Unspecified asthma, uncomplicated: Secondary | ICD-10-CM | POA: Diagnosis not present

## 2017-03-30 NOTE — Telephone Encounter (Signed)
Ben (PT) with [facility] want to informed the office there will be a delayed for PT evaluation, due to pt not answering the door and phone. Will try to attempt to see patient again on 03/31/2017.

## 2017-04-02 DIAGNOSIS — R2681 Unsteadiness on feet: Secondary | ICD-10-CM | POA: Diagnosis not present

## 2017-04-02 DIAGNOSIS — I10 Essential (primary) hypertension: Secondary | ICD-10-CM | POA: Diagnosis not present

## 2017-04-02 DIAGNOSIS — Z9181 History of falling: Secondary | ICD-10-CM | POA: Diagnosis not present

## 2017-04-03 ENCOUNTER — Encounter: Payer: Self-pay | Admitting: Internal Medicine

## 2017-04-03 ENCOUNTER — Telehealth: Payer: Self-pay

## 2017-04-03 NOTE — Telephone Encounter (Signed)
[facility] community referral form faxed 04/02/2017.

## 2017-04-03 NOTE — Progress Notes (Signed)
Patient ID: [name], male   DOB: [birth year], 71 y.o.   MRN: [id number]  BMP unremarkable with normal potassium and slight increase in creatinine with an eGFR nearly 70.  We will continue the HCTZ.

## 2017-04-03 NOTE — Progress Notes (Signed)
Patient ID: [name], male   DOB: June 20, 1946, 71 y.o.   MRN: [id number]  April 03, 2017  [facility] [address] [place], [place] [address]  Dear [name],  Your blood work we drew at the clinic appointment just over 1 week ago was completely normal.  Please keep taking all of the medications as prescribed.  Sincerely,   Terressa Koyanagi  The above letter was sent to [name] at his request as the preferred method of communications for his lab work.  He has difficulty getting to the phone in time, hence why her prefers letters.

## 2017-04-04 DIAGNOSIS — I10 Essential (primary) hypertension: Secondary | ICD-10-CM | POA: Diagnosis not present

## 2017-04-04 DIAGNOSIS — J45909 Unspecified asthma, uncomplicated: Secondary | ICD-10-CM | POA: Diagnosis not present

## 2017-04-05 DIAGNOSIS — J45909 Unspecified asthma, uncomplicated: Secondary | ICD-10-CM | POA: Diagnosis not present

## 2017-04-05 DIAGNOSIS — I10 Essential (primary) hypertension: Secondary | ICD-10-CM | POA: Diagnosis not present

## 2017-04-06 DIAGNOSIS — J45909 Unspecified asthma, uncomplicated: Secondary | ICD-10-CM | POA: Diagnosis not present

## 2017-04-06 DIAGNOSIS — I10 Essential (primary) hypertension: Secondary | ICD-10-CM | POA: Diagnosis not present

## 2017-04-06 DIAGNOSIS — R2681 Unsteadiness on feet: Secondary | ICD-10-CM | POA: Diagnosis not present

## 2017-04-06 DIAGNOSIS — Z9181 History of falling: Secondary | ICD-10-CM | POA: Diagnosis not present

## 2017-04-09 DIAGNOSIS — J45909 Unspecified asthma, uncomplicated: Secondary | ICD-10-CM | POA: Diagnosis not present

## 2017-04-09 DIAGNOSIS — I10 Essential (primary) hypertension: Secondary | ICD-10-CM | POA: Diagnosis not present

## 2017-04-10 DIAGNOSIS — J45909 Unspecified asthma, uncomplicated: Secondary | ICD-10-CM | POA: Diagnosis not present

## 2017-04-10 DIAGNOSIS — I10 Essential (primary) hypertension: Secondary | ICD-10-CM | POA: Diagnosis not present

## 2017-04-10 DIAGNOSIS — Z9181 History of falling: Secondary | ICD-10-CM | POA: Diagnosis not present

## 2017-04-10 DIAGNOSIS — R2681 Unsteadiness on feet: Secondary | ICD-10-CM | POA: Diagnosis not present

## 2017-04-10 NOTE — Telephone Encounter (Signed)
Patient has now been seen by [facility].

## 2017-04-10 NOTE — Addendum Note (Signed)
Addended by: Hulan Fray on: 04/10/2017 05:34 PM   Modules accepted: Orders

## 2017-04-11 DIAGNOSIS — J45909 Unspecified asthma, uncomplicated: Secondary | ICD-10-CM | POA: Diagnosis not present

## 2017-04-11 DIAGNOSIS — I10 Essential (primary) hypertension: Secondary | ICD-10-CM | POA: Diagnosis not present

## 2017-04-12 DIAGNOSIS — Z9181 History of falling: Secondary | ICD-10-CM | POA: Diagnosis not present

## 2017-04-12 DIAGNOSIS — I10 Essential (primary) hypertension: Secondary | ICD-10-CM | POA: Diagnosis not present

## 2017-04-12 DIAGNOSIS — J45909 Unspecified asthma, uncomplicated: Secondary | ICD-10-CM | POA: Diagnosis not present

## 2017-04-12 DIAGNOSIS — R2681 Unsteadiness on feet: Secondary | ICD-10-CM | POA: Diagnosis not present

## 2017-04-13 DIAGNOSIS — I10 Essential (primary) hypertension: Secondary | ICD-10-CM | POA: Diagnosis not present

## 2017-04-13 DIAGNOSIS — J45909 Unspecified asthma, uncomplicated: Secondary | ICD-10-CM | POA: Diagnosis not present

## 2017-04-16 DIAGNOSIS — I10 Essential (primary) hypertension: Secondary | ICD-10-CM | POA: Diagnosis not present

## 2017-04-16 DIAGNOSIS — J45909 Unspecified asthma, uncomplicated: Secondary | ICD-10-CM | POA: Diagnosis not present

## 2017-04-17 DIAGNOSIS — I10 Essential (primary) hypertension: Secondary | ICD-10-CM | POA: Diagnosis not present

## 2017-04-17 DIAGNOSIS — J45909 Unspecified asthma, uncomplicated: Secondary | ICD-10-CM | POA: Diagnosis not present

## 2017-04-18 DIAGNOSIS — J45909 Unspecified asthma, uncomplicated: Secondary | ICD-10-CM | POA: Diagnosis not present

## 2017-04-18 DIAGNOSIS — I10 Essential (primary) hypertension: Secondary | ICD-10-CM | POA: Diagnosis not present

## 2017-04-19 DIAGNOSIS — I10 Essential (primary) hypertension: Secondary | ICD-10-CM | POA: Diagnosis not present

## 2017-04-19 DIAGNOSIS — J45909 Unspecified asthma, uncomplicated: Secondary | ICD-10-CM | POA: Diagnosis not present

## 2017-04-20 DIAGNOSIS — I10 Essential (primary) hypertension: Secondary | ICD-10-CM | POA: Diagnosis not present

## 2017-04-20 DIAGNOSIS — R2681 Unsteadiness on feet: Secondary | ICD-10-CM | POA: Diagnosis not present

## 2017-04-20 DIAGNOSIS — J45909 Unspecified asthma, uncomplicated: Secondary | ICD-10-CM | POA: Diagnosis not present

## 2017-04-20 DIAGNOSIS — Z9181 History of falling: Secondary | ICD-10-CM | POA: Diagnosis not present

## 2017-04-24 ENCOUNTER — Telehealth: Payer: Self-pay | Admitting: *Deleted

## 2017-04-24 DIAGNOSIS — J45909 Unspecified asthma, uncomplicated: Secondary | ICD-10-CM | POA: Diagnosis not present

## 2017-04-24 DIAGNOSIS — I10 Essential (primary) hypertension: Secondary | ICD-10-CM | POA: Diagnosis not present

## 2017-04-24 DIAGNOSIS — M1711 Unilateral primary osteoarthritis, right knee: Secondary | ICD-10-CM

## 2017-04-24 DIAGNOSIS — R2681 Unsteadiness on feet: Secondary | ICD-10-CM | POA: Diagnosis not present

## 2017-04-24 DIAGNOSIS — R269 Unspecified abnormalities of gait and mobility: Secondary | ICD-10-CM

## 2017-04-24 DIAGNOSIS — Z9181 History of falling: Secondary | ICD-10-CM | POA: Diagnosis not present

## 2017-04-24 NOTE — Telephone Encounter (Signed)
Received notice from front office that patient has completed PT/Gait training and is now requesting DME order for walker.  Order placed.Regenia Skeeter, Prabhjot Piscitello Cassady10/30/20189:34 AM

## 2017-04-25 DIAGNOSIS — J45909 Unspecified asthma, uncomplicated: Secondary | ICD-10-CM | POA: Diagnosis not present

## 2017-04-25 DIAGNOSIS — I10 Essential (primary) hypertension: Secondary | ICD-10-CM | POA: Diagnosis not present

## 2017-04-26 DIAGNOSIS — J45909 Unspecified asthma, uncomplicated: Secondary | ICD-10-CM | POA: Diagnosis not present

## 2017-04-26 DIAGNOSIS — I10 Essential (primary) hypertension: Secondary | ICD-10-CM | POA: Diagnosis not present

## 2017-04-27 DIAGNOSIS — J45909 Unspecified asthma, uncomplicated: Secondary | ICD-10-CM | POA: Diagnosis not present

## 2017-04-27 DIAGNOSIS — I10 Essential (primary) hypertension: Secondary | ICD-10-CM | POA: Diagnosis not present

## 2017-04-30 DIAGNOSIS — I10 Essential (primary) hypertension: Secondary | ICD-10-CM | POA: Diagnosis not present

## 2017-04-30 DIAGNOSIS — J45909 Unspecified asthma, uncomplicated: Secondary | ICD-10-CM | POA: Diagnosis not present

## 2017-05-01 DIAGNOSIS — J45909 Unspecified asthma, uncomplicated: Secondary | ICD-10-CM | POA: Diagnosis not present

## 2017-05-01 DIAGNOSIS — I10 Essential (primary) hypertension: Secondary | ICD-10-CM | POA: Diagnosis not present

## 2017-05-02 DIAGNOSIS — J45909 Unspecified asthma, uncomplicated: Secondary | ICD-10-CM | POA: Diagnosis not present

## 2017-05-02 DIAGNOSIS — I10 Essential (primary) hypertension: Secondary | ICD-10-CM | POA: Diagnosis not present

## 2017-05-03 DIAGNOSIS — J45909 Unspecified asthma, uncomplicated: Secondary | ICD-10-CM | POA: Diagnosis not present

## 2017-05-03 DIAGNOSIS — I10 Essential (primary) hypertension: Secondary | ICD-10-CM | POA: Diagnosis not present

## 2017-05-04 DIAGNOSIS — I10 Essential (primary) hypertension: Secondary | ICD-10-CM | POA: Diagnosis not present

## 2017-05-04 DIAGNOSIS — J45909 Unspecified asthma, uncomplicated: Secondary | ICD-10-CM | POA: Diagnosis not present

## 2017-05-07 DIAGNOSIS — I10 Essential (primary) hypertension: Secondary | ICD-10-CM | POA: Diagnosis not present

## 2017-05-07 DIAGNOSIS — J45909 Unspecified asthma, uncomplicated: Secondary | ICD-10-CM | POA: Diagnosis not present

## 2017-05-08 DIAGNOSIS — I10 Essential (primary) hypertension: Secondary | ICD-10-CM | POA: Diagnosis not present

## 2017-05-08 DIAGNOSIS — J45909 Unspecified asthma, uncomplicated: Secondary | ICD-10-CM | POA: Diagnosis not present

## 2017-05-09 DIAGNOSIS — J45909 Unspecified asthma, uncomplicated: Secondary | ICD-10-CM | POA: Diagnosis not present

## 2017-05-09 DIAGNOSIS — I10 Essential (primary) hypertension: Secondary | ICD-10-CM | POA: Diagnosis not present

## 2017-05-10 DIAGNOSIS — I10 Essential (primary) hypertension: Secondary | ICD-10-CM | POA: Diagnosis not present

## 2017-05-10 DIAGNOSIS — J45909 Unspecified asthma, uncomplicated: Secondary | ICD-10-CM | POA: Diagnosis not present

## 2017-05-11 DIAGNOSIS — I10 Essential (primary) hypertension: Secondary | ICD-10-CM | POA: Diagnosis not present

## 2017-05-11 DIAGNOSIS — J45909 Unspecified asthma, uncomplicated: Secondary | ICD-10-CM | POA: Diagnosis not present

## 2017-05-14 ENCOUNTER — Encounter: Payer: Self-pay | Admitting: Podiatry

## 2017-05-14 ENCOUNTER — Ambulatory Visit (INDEPENDENT_AMBULATORY_CARE_PROVIDER_SITE_OTHER): Payer: Medicare HMO | Admitting: Podiatry

## 2017-05-14 DIAGNOSIS — J45909 Unspecified asthma, uncomplicated: Secondary | ICD-10-CM | POA: Diagnosis not present

## 2017-05-14 DIAGNOSIS — Q828 Other specified congenital malformations of skin: Secondary | ICD-10-CM

## 2017-05-14 DIAGNOSIS — B351 Tinea unguium: Secondary | ICD-10-CM | POA: Diagnosis not present

## 2017-05-14 DIAGNOSIS — M79674 Pain in right toe(s): Secondary | ICD-10-CM

## 2017-05-14 DIAGNOSIS — M79675 Pain in left toe(s): Secondary | ICD-10-CM | POA: Diagnosis not present

## 2017-05-14 DIAGNOSIS — I10 Essential (primary) hypertension: Secondary | ICD-10-CM | POA: Diagnosis not present

## 2017-05-14 NOTE — Progress Notes (Signed)
Patient ID: [name], male   DOB: 04/09/1946, 71 y.o.   MRN: [id number]    Subjective: This patient presents today again complaining of elongated, thickened toenails and multiple painful plantar skin lesions right and left feet and is requesting debridement of these areas  Objective: Orientated 3 DP pulses 2/4 bilaterally PT pulses 1/4 bilaterally Sensation to 10 g monofilament wire intact 4/5 bilaterally Vibratory sensation nonreactive bilaterally Ankle reflexes nonreactive bilaterally Walker required for ambulation Dorsi flexion, plantar flexion 5/5 bilaterally Pes planus bilaterally No open skin lesions bilaterally Partial sloughing of second left toenail with dry crusted nailbed without any surrounding erythema, edema, warmth, drainage The toenails are brittle, hypertrophic, deformed, discolored and tender to palpation 6-10 Multiple nucleated plantar keratoses 6  Mc slowly with assistance of roller walker  Assessment: Symptomatic onychomycoses 6-10 Porokeratosis 6  Plan: Debridement toenails 10 mechanically and electrically without any bleeding Debrided porokeratosis 6 without any bleeding  Reappoint 3 months

## 2017-05-15 DIAGNOSIS — I10 Essential (primary) hypertension: Secondary | ICD-10-CM | POA: Diagnosis not present

## 2017-05-15 DIAGNOSIS — J45909 Unspecified asthma, uncomplicated: Secondary | ICD-10-CM | POA: Diagnosis not present

## 2017-05-16 DIAGNOSIS — I10 Essential (primary) hypertension: Secondary | ICD-10-CM | POA: Diagnosis not present

## 2017-05-16 DIAGNOSIS — J45909 Unspecified asthma, uncomplicated: Secondary | ICD-10-CM | POA: Diagnosis not present

## 2017-05-21 ENCOUNTER — Ambulatory Visit (INDEPENDENT_AMBULATORY_CARE_PROVIDER_SITE_OTHER): Payer: Medicare HMO | Admitting: Gastroenterology

## 2017-05-21 ENCOUNTER — Encounter: Payer: Self-pay | Admitting: Gastroenterology

## 2017-05-21 VITALS — BP 118/68 | HR 84 | Ht 74.0 in | Wt 276.0 lb

## 2017-05-21 DIAGNOSIS — I10 Essential (primary) hypertension: Secondary | ICD-10-CM | POA: Diagnosis not present

## 2017-05-21 DIAGNOSIS — K220 Achalasia of cardia: Secondary | ICD-10-CM | POA: Diagnosis not present

## 2017-05-21 DIAGNOSIS — G609 Hereditary and idiopathic neuropathy, unspecified: Secondary | ICD-10-CM | POA: Diagnosis not present

## 2017-05-21 DIAGNOSIS — R1013 Epigastric pain: Secondary | ICD-10-CM | POA: Diagnosis not present

## 2017-05-21 DIAGNOSIS — R1310 Dysphagia, unspecified: Secondary | ICD-10-CM | POA: Diagnosis not present

## 2017-05-21 DIAGNOSIS — J45909 Unspecified asthma, uncomplicated: Secondary | ICD-10-CM | POA: Diagnosis not present

## 2017-05-21 DIAGNOSIS — M1711 Unilateral primary osteoarthritis, right knee: Secondary | ICD-10-CM | POA: Diagnosis not present

## 2017-05-21 DIAGNOSIS — R1319 Other dysphagia: Secondary | ICD-10-CM

## 2017-05-21 DIAGNOSIS — M4647 Discitis, unspecified, lumbosacral region: Secondary | ICD-10-CM | POA: Diagnosis not present

## 2017-05-21 NOTE — Patient Instructions (Addendum)
If you are age 73 or older, your body mass index should be between 23-30. Your Body mass index is 35.44 kg/m. If this is out of the aforementioned range listed, please consider follow up with your Primary Care Provider.  If you are age 73 or younger, your body mass index should be between 19-25. Your Body mass index is 35.44 kg/m. If this is out of the aformentioned range listed, please consider follow up with your Primary Care Provider.    You have been scheduled for an endoscopy. Please follow written instructions given to you at your visit today. If you use inhalers (even only as needed), please bring them with you on the day of your procedure. Your physician has requested that you go to www.startemmi.com and enter the access code given to you at your visit today. This web site gives a general overview about your procedure. However, you should still follow specific instructions given to you by our office regarding your preparation for the procedure.  Thank you for choosing [facility]  Dr Wilfrid Lund III

## 2017-05-21 NOTE — Progress Notes (Signed)
[facility] Consult Note:  History: [name] 05/21/2017  Referring physician: [name], MD  Reason for consult/chief complaint: Dysphagia (hard to swallow liquids and soilds) and Abdominal Pain (Epigastric pain )   Subjective  HPI:  This is a 71 year old man referred by primary care noted above and last seen by Dr. Deatra Ina in October 2015.  He has a history of achalasia based on previous upper endoscopy findings of a patulous esophagus, and resistance passing the scope through the LES along with a manometry that showed high LES pressure with impaired relaxation but overall preserved peristalsis.  He received several treatments of Botox in 2015 that apparently gave excellent response.  He is a limited historian, but it seems that over the last few months he has had recurrence of dysphagia to both solids and liquids.  His appetite is generally been good and weight stable.  He denies change in bowel habits or rectal bleeding.  ROS:  Review of Systems  Constitutional: Negative for appetite change and unexpected weight change.  HENT: Negative for mouth sores and voice change.   Eyes: Negative for pain and redness.  Respiratory: Negative for cough and shortness of breath.   Cardiovascular: Positive for leg swelling. Negative for chest pain and palpitations.  Genitourinary: Negative for dysuria and hematuria.  Musculoskeletal: Positive for arthralgias. Negative for myalgias.  Skin: Negative for pallor and rash.  Neurological: Negative for weakness and headaches.  Hematological: Negative for adenopathy.     Past Medical History: Past Medical History:  Diagnosis Date  . 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  Excellent symptomatic response to LES Botox injections.   . AVM (arteriovenous malformation) of colon 08/26/2013   Cecum X 3, without bleeding on  colonoscopy February 2015   . B12 deficiency 10/01/2011   Discovered to 2 progressive neurologic pain and dysfunction.  Diagnosis established March 2013.  Requires the following treatment: B12 IM monthly until level normalizes. After that, oral supplementation.   . Cataract   . Chronic venous insufficiency 11/28/2013  . Constipation due to opioid therapy 03/11/2015  . Diverticulosis 08/26/2013  . Essential hypertension 06/15/2006  . Family history of malignant neoplasm of gastrointestinal tract 06/30/2013   Father with colon cancer age 73   . Gastroesophageal reflux disease 02/04/2007  . Internal hemorrhoids 08/26/2013  . Left posterior subcapsular cataract 10/03/2013   s/p yag laser capsulotomy 10/03/2013   . 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  Complicated by chronic low back pain and neuropathy.  Nerve conduction study (10/19/11): Diffusely low motor amplitudes, with primarily involvement of the peroneal and posterior tibial nerves. No evidence of generalized peripheral neuropathy. Findings could be c/w bil multilevel lumbosacral radiculopathies or a primary motor neuropathy.   . Mild aortic valve stenosis 10/09/2006   Echo (12/09/2009): Valve area: 2.03 cm2   . Neuropathy   . Obesity, Class I, BMI 30-34.9 04/05/2012  . Onychomycosis of toenail 06/05/2014  . Osteoarthritis 11/25/2009   Right knee (medial compartment), right hip, left shoulder   . 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 Trigger little finger of right hand 03/11/2015     Past Surgical History: Past Surgical History:  Procedure Laterality Date  . BOTOX INJECTION N/A 09/24/2013   Procedure:  BOTOX INJECTION;  Surgeon: Inda Castle, MD;  Location: [facility];  Service: Endoscopy;  Laterality: N/A;  . CAPSULOTOMY Left 10/03/2013     Procedure: YAG CAPSULOTOMY OF LEFT EYE;  Surgeon: Myrtha Mantis., MD;  Location: [facility];  Service: Ophthalmology;  Laterality: Left;  . CATARACT EXTRACTION  05/01/2012   left eye  . CATARACT EXTRACTION W/PHACO  11/08/2011   Procedure: CATARACT EXTRACTION PHACO AND INTRAOCULAR LENS PLACEMENT (IOC);  Surgeon: Adonis Brook, MD;  Location: [facility];  Service: Ophthalmology;  Laterality: Right;  . CATARACT EXTRACTION W/PHACO  05/01/2012   Procedure: CATARACT EXTRACTION PHACO AND INTRAOCULAR LENS PLACEMENT (IOC);  Surgeon: Adonis Brook, MD;  Location: [facility];  Service: Ophthalmology;  Laterality: Left;  . colonscopy    . ESOPHAGEAL MANOMETRY N/A 08/25/2013   Procedure: ESOPHAGEAL MANOMETRY (EM);  Surgeon: Inda Castle, MD;  Location: [facility];  Service: Endoscopy;  Laterality: N/A;  . ESOPHAGOGASTRODUODENOSCOPY N/A 09/24/2013   Procedure: ESOPHAGOGASTRODUODENOSCOPY (EGD);  Surgeon: Inda Castle, MD;  Location: [facility];  Service: Endoscopy;  Laterality: N/A;  . ESOPHAGOGASTRODUODENOSCOPY N/A 02/17/2014   Procedure: ESOPHAGOGASTRODUODENOSCOPY (EGD);  Surgeon: Inda Castle, MD;  Location: [facility];  Service: Endoscopy;  Laterality: N/A;  . EYE SURGERY     cat ext right  . FRACTURE SURGERY     left shoulder  . GAS/FLUID EXCHANGE Right 07/02/2013   Procedure: GAS/FLUID EXCHANGE;  Surgeon: Adonis Brook, MD;  Location: [facility];  Service: Ophthalmology;  Laterality: Right;  . PARS PLANA VITRECTOMY Right 07/02/2013   Procedure: PARS PLANA VITRECTOMY WITH 23 GAUGE WITH MEMBRANE PEEL AND ENDOLASER AND SILICONE OIL;  Surgeon: Adonis Brook, MD;  Location: [facility];  Service: Ophthalmology;  Laterality: Right;  . SCLERAL BUCKLE Right 03/15/2013   Procedure: SCLERAL BUCKLE with 23Ga Vit;  Surgeon: Adonis Brook, MD;  Location: [facility];  Service: Ophthalmology;  Laterality: Right;     Family History: Family History  Problem Relation Age of Onset  . Heart disease Mother   . Colon cancer Father 36   . Kidney disease Brother        on dialysis  . Pancreatic cancer Brother   . Hypertension Sister   . Diabetes Sister   . Kidney disease Brother        on dialysis  . Diabetes Sister   . Colon cancer Brother        2 brothers with colon cancer  . Cancer Daughter 1       Died at age of 98, unknown type  . Anesthesia problems Neg Hx     Social History: Social History   Socioeconomic History  . Marital status: Single    Spouse name: None  . Number of children: None  . Years of education: None  . Highest education level: None  Social Needs  . Financial resource strain: None  . Food insecurity - worry: None  . Food insecurity - inability: None  . Transportation needs - medical: None  . Transportation needs - non-medical: None  Occupational History  . None  Tobacco Use  . Smoking status: Former Smoker    Packs/day: 0.50    Years: 30.00    Pack years: 15.00    Types: Cigarettes    Last attempt to quit: 11/05/2010    Years since quitting: 6.5  . Smokeless tobacco: Never Used  Substance and Sexual Activity  . Alcohol use: Yes    Alcohol/week: 0.0 oz    Comment: every now  and then  . Drug use: No  . Sexual activity: No    Comment: quit in 2014  Other Topics Concern  . None  Social History Narrative  . None    Allergies: Allergies  Allergen Reactions  . Amlodipine Swelling    Bilateral lower extremity swelling    Outpatient Meds: Current Outpatient Medications  Medication Sig Dispense Refill  . aspirin 81 MG EC tablet Take 81 mg by mouth daily.      . COMBIGAN 0.2-0.5 % ophthalmic solution Place 1 drop into both eyes 2 (two) times daily.    [name] gabapentin (NEURONTIN) 300 MG capsule Take 2 capsules (600 mg total) by mouth 3 (three) times daily. 540 capsule 3  . hydrochlorothiazide (HYDRODIURIL) 25 MG tablet Take 1 tablet (25 mg total) by mouth daily. 90 tablet 3  . ibuprofen (IBU) 800 MG tablet Take 1 tablet (800 mg total) by mouth every 8 (eight) hours as  needed for moderate pain. 90 tablet 11  . omeprazole (PRILOSEC) 20 MG capsule Take 1 capsule (20 mg total) by mouth daily. 90 capsule 3  . oxyCODONE-acetaminophen (PERCOCET) 10-325 MG tablet Take 1 tablet by mouth every 6 (six) hours as needed for pain. 75 tablet 0  . sorbitol 70 % solution Take 15 mLs by mouth daily as needed. 3840 mL 3  . verapamil (CALAN-SR) 180 MG CR tablet Take 1 tablet (180 mg total) by mouth daily. 90 tablet 3  . vitamin B-12 (CYANOCOBALAMIN) 1000 MCG tablet Take 1 tablet (1,000 mcg total) by mouth daily. 90 tablet 3   No current facility-administered medications for this visit.       ___________________________________________________________________ Objective   Exam:  BP 118/68   Pulse 84   Ht 6\' 2"  (1.88 m)   Wt 276 lb (125.2 kg)   BMI 35.44 kg/m    General: this is a(n) chronically ill-appearing elderly man, antalgic gait, gets on exam table with great difficulty  Eyes: sclera anicteric, no redness  ENT: oral mucosa moist without lesions, no cervical or supraclavicular lymphadenopathy, fair dentition  CV: RRR with systolic murmur, no JVD, 3 + peripheral edema  Resp: clear to auscultation bilaterally, normal RR and effort noted  GI: soft, no tenderness, with active bowel sounds. No guarding or palpable organomegaly noted.  Skin; warm and dry, no rash or jaundice noted  Neuro: awake, alert and oriented x 3. Normal gross motor function and fluent speech  Labs:  No recent CBC  CMP Latest Ref Rng & Units 03/23/2017 08/25/2016 07/16/2015  Glucose 65 - 99 mg/dL 69 86 73  BUN 8 - 27 mg/dL 15 8 13   Creatinine 0.76 - 1.27 mg/dL 1.20 1.03 1.02  Sodium 134 - 144 mmol/L 143 145(H) 143  Potassium 3.5 - 5.2 mmol/L 4.3 4.2 4.4  Chloride 96 - 106 mmol/L 103 101 102  CO2 20 - 29 mmol/L 26 18 25   Calcium 8.6 - 10.2 mg/dL 9.8 9.8 9.6  Total Protein 6.0 - 8.3 g/dL - - -  Total Bilirubin 0.3 - 1.2 mg/dL - - -  Alkaline Phos 39 - 117 U/L - - -  AST 0 - 37 U/L  - - -  ALT 0 - 53 U/L - - -     Assessment: Encounter Diagnoses  Name Primary?  . Esophageal dysphagia Yes  . Achalasia     He appears to have recurrence of symptoms from his known achalasia.  Plan:  Upper endoscopy with LES Botox injection.  He is  agreeable after discussion of procedure and risks.  The benefits and risks of the planned procedure were described in detail with the patient or (when appropriate) their health care proxy.  Risks were outlined as including, but not limited to, bleeding, infection, perforation, adverse medication reaction leading to cardiac or pulmonary decompensation, or pancreatitis (if ERCP).  The limitation of incomplete mucosal visualization was also discussed.  No guarantees or warranties were given.  This procedure will be done at the hospital due to its nature and the patient's medical complexity  Thank you for the courtesy of this consult.  Please call me with any questions or concerns.  [name]  CC: [name], MD

## 2017-05-22 DIAGNOSIS — I10 Essential (primary) hypertension: Secondary | ICD-10-CM | POA: Diagnosis not present

## 2017-05-22 DIAGNOSIS — J45909 Unspecified asthma, uncomplicated: Secondary | ICD-10-CM | POA: Diagnosis not present

## 2017-05-22 DIAGNOSIS — H401122 Primary open-angle glaucoma, left eye, moderate stage: Secondary | ICD-10-CM | POA: Diagnosis not present

## 2017-05-22 DIAGNOSIS — H401113 Primary open-angle glaucoma, right eye, severe stage: Secondary | ICD-10-CM | POA: Diagnosis not present

## 2017-05-22 DIAGNOSIS — H33021 Retinal detachment with multiple breaks, right eye: Secondary | ICD-10-CM | POA: Diagnosis not present

## 2017-05-23 DIAGNOSIS — J45909 Unspecified asthma, uncomplicated: Secondary | ICD-10-CM | POA: Diagnosis not present

## 2017-05-23 DIAGNOSIS — I10 Essential (primary) hypertension: Secondary | ICD-10-CM | POA: Diagnosis not present

## 2017-05-24 DIAGNOSIS — J45909 Unspecified asthma, uncomplicated: Secondary | ICD-10-CM | POA: Diagnosis not present

## 2017-05-24 DIAGNOSIS — I10 Essential (primary) hypertension: Secondary | ICD-10-CM | POA: Diagnosis not present

## 2017-05-25 DIAGNOSIS — J45909 Unspecified asthma, uncomplicated: Secondary | ICD-10-CM | POA: Diagnosis not present

## 2017-05-25 DIAGNOSIS — I10 Essential (primary) hypertension: Secondary | ICD-10-CM | POA: Diagnosis not present

## 2017-05-28 ENCOUNTER — Other Ambulatory Visit: Payer: Self-pay | Admitting: Internal Medicine

## 2017-05-28 DIAGNOSIS — M1711 Unilateral primary osteoarthritis, right knee: Secondary | ICD-10-CM

## 2017-05-28 DIAGNOSIS — I10 Essential (primary) hypertension: Secondary | ICD-10-CM | POA: Diagnosis not present

## 2017-05-28 DIAGNOSIS — J45909 Unspecified asthma, uncomplicated: Secondary | ICD-10-CM | POA: Diagnosis not present

## 2017-05-28 MED ORDER — OXYCODONE-ACETAMINOPHEN 10-325 MG PO TABS: 1.0000 | ORAL_TABLET | Freq: Four times a day (QID) | ORAL | 0 refills | Status: DC | PRN

## 2017-05-29 DIAGNOSIS — J45909 Unspecified asthma, uncomplicated: Secondary | ICD-10-CM | POA: Diagnosis not present

## 2017-05-29 DIAGNOSIS — I10 Essential (primary) hypertension: Secondary | ICD-10-CM | POA: Diagnosis not present

## 2017-05-30 DIAGNOSIS — J45909 Unspecified asthma, uncomplicated: Secondary | ICD-10-CM | POA: Diagnosis not present

## 2017-05-30 DIAGNOSIS — I10 Essential (primary) hypertension: Secondary | ICD-10-CM | POA: Diagnosis not present

## 2017-05-31 DIAGNOSIS — I10 Essential (primary) hypertension: Secondary | ICD-10-CM | POA: Diagnosis not present

## 2017-05-31 DIAGNOSIS — J45909 Unspecified asthma, uncomplicated: Secondary | ICD-10-CM | POA: Diagnosis not present

## 2017-06-01 DIAGNOSIS — I10 Essential (primary) hypertension: Secondary | ICD-10-CM | POA: Diagnosis not present

## 2017-06-01 DIAGNOSIS — J45909 Unspecified asthma, uncomplicated: Secondary | ICD-10-CM | POA: Diagnosis not present

## 2017-06-05 DIAGNOSIS — I10 Essential (primary) hypertension: Secondary | ICD-10-CM | POA: Diagnosis not present

## 2017-06-05 DIAGNOSIS — J45909 Unspecified asthma, uncomplicated: Secondary | ICD-10-CM | POA: Diagnosis not present

## 2017-06-06 ENCOUNTER — Other Ambulatory Visit: Payer: Self-pay | Admitting: Internal Medicine

## 2017-06-06 DIAGNOSIS — M1711 Unilateral primary osteoarthritis, right knee: Secondary | ICD-10-CM

## 2017-06-06 DIAGNOSIS — I10 Essential (primary) hypertension: Secondary | ICD-10-CM | POA: Diagnosis not present

## 2017-06-06 DIAGNOSIS — J45909 Unspecified asthma, uncomplicated: Secondary | ICD-10-CM | POA: Diagnosis not present

## 2017-06-06 NOTE — Telephone Encounter (Signed)
Was done 12/3, will call pt

## 2017-06-06 NOTE — Telephone Encounter (Signed)
Refill Request ° ° °oxyCODONE-acetaminophen (PERCOCET) 10-325 MG tablet °

## 2017-06-07 DIAGNOSIS — I10 Essential (primary) hypertension: Secondary | ICD-10-CM | POA: Diagnosis not present

## 2017-06-07 DIAGNOSIS — J45909 Unspecified asthma, uncomplicated: Secondary | ICD-10-CM | POA: Diagnosis not present

## 2017-06-07 NOTE — Telephone Encounter (Signed)
No answer

## 2017-06-07 NOTE — Telephone Encounter (Signed)
Pt informed ready

## 2017-06-08 DIAGNOSIS — J45909 Unspecified asthma, uncomplicated: Secondary | ICD-10-CM | POA: Diagnosis not present

## 2017-06-08 DIAGNOSIS — I10 Essential (primary) hypertension: Secondary | ICD-10-CM | POA: Diagnosis not present

## 2017-06-11 ENCOUNTER — Other Ambulatory Visit: Payer: Self-pay | Admitting: Internal Medicine

## 2017-06-11 DIAGNOSIS — E538 Deficiency of other specified B group vitamins: Secondary | ICD-10-CM

## 2017-06-11 DIAGNOSIS — I10 Essential (primary) hypertension: Secondary | ICD-10-CM | POA: Diagnosis not present

## 2017-06-11 DIAGNOSIS — J45909 Unspecified asthma, uncomplicated: Secondary | ICD-10-CM | POA: Diagnosis not present

## 2017-06-12 DIAGNOSIS — I10 Essential (primary) hypertension: Secondary | ICD-10-CM | POA: Diagnosis not present

## 2017-06-12 DIAGNOSIS — J45909 Unspecified asthma, uncomplicated: Secondary | ICD-10-CM | POA: Diagnosis not present

## 2017-06-13 DIAGNOSIS — I10 Essential (primary) hypertension: Secondary | ICD-10-CM | POA: Diagnosis not present

## 2017-06-13 DIAGNOSIS — J45909 Unspecified asthma, uncomplicated: Secondary | ICD-10-CM | POA: Diagnosis not present

## 2017-06-14 DIAGNOSIS — J45909 Unspecified asthma, uncomplicated: Secondary | ICD-10-CM | POA: Diagnosis not present

## 2017-06-14 DIAGNOSIS — I10 Essential (primary) hypertension: Secondary | ICD-10-CM | POA: Diagnosis not present

## 2017-06-15 DIAGNOSIS — J45909 Unspecified asthma, uncomplicated: Secondary | ICD-10-CM | POA: Diagnosis not present

## 2017-06-15 DIAGNOSIS — I10 Essential (primary) hypertension: Secondary | ICD-10-CM | POA: Diagnosis not present

## 2017-06-18 DIAGNOSIS — I10 Essential (primary) hypertension: Secondary | ICD-10-CM | POA: Diagnosis not present

## 2017-06-18 DIAGNOSIS — J45909 Unspecified asthma, uncomplicated: Secondary | ICD-10-CM | POA: Diagnosis not present

## 2017-06-19 DIAGNOSIS — J45909 Unspecified asthma, uncomplicated: Secondary | ICD-10-CM | POA: Diagnosis not present

## 2017-06-19 DIAGNOSIS — I10 Essential (primary) hypertension: Secondary | ICD-10-CM | POA: Diagnosis not present

## 2017-06-20 DIAGNOSIS — J45909 Unspecified asthma, uncomplicated: Secondary | ICD-10-CM | POA: Diagnosis not present

## 2017-06-20 DIAGNOSIS — I10 Essential (primary) hypertension: Secondary | ICD-10-CM | POA: Diagnosis not present

## 2017-06-21 DIAGNOSIS — I10 Essential (primary) hypertension: Secondary | ICD-10-CM | POA: Diagnosis not present

## 2017-06-21 DIAGNOSIS — J45909 Unspecified asthma, uncomplicated: Secondary | ICD-10-CM | POA: Diagnosis not present

## 2017-06-22 DIAGNOSIS — I10 Essential (primary) hypertension: Secondary | ICD-10-CM | POA: Diagnosis not present

## 2017-06-22 DIAGNOSIS — J45909 Unspecified asthma, uncomplicated: Secondary | ICD-10-CM | POA: Diagnosis not present

## 2017-06-25 DIAGNOSIS — J45909 Unspecified asthma, uncomplicated: Secondary | ICD-10-CM | POA: Diagnosis not present

## 2017-06-25 DIAGNOSIS — I10 Essential (primary) hypertension: Secondary | ICD-10-CM | POA: Diagnosis not present

## 2017-06-29 ENCOUNTER — Encounter: Payer: Self-pay | Admitting: Internal Medicine

## 2017-06-29 ENCOUNTER — Ambulatory Visit (INDEPENDENT_AMBULATORY_CARE_PROVIDER_SITE_OTHER): Payer: Medicare Other | Admitting: Internal Medicine

## 2017-06-29 ENCOUNTER — Other Ambulatory Visit: Payer: Self-pay

## 2017-06-29 ENCOUNTER — Encounter (INDEPENDENT_AMBULATORY_CARE_PROVIDER_SITE_OTHER): Payer: Self-pay

## 2017-06-29 VITALS — BP 125/66 | HR 68 | Temp 98.0°F | Wt 282.4 lb

## 2017-06-29 DIAGNOSIS — G8929 Other chronic pain: Secondary | ICD-10-CM

## 2017-06-29 DIAGNOSIS — Z79891 Long term (current) use of opiate analgesic: Secondary | ICD-10-CM

## 2017-06-29 DIAGNOSIS — I10 Essential (primary) hypertension: Secondary | ICD-10-CM

## 2017-06-29 DIAGNOSIS — M48062 Spinal stenosis, lumbar region with neurogenic claudication: Secondary | ICD-10-CM | POA: Diagnosis not present

## 2017-06-29 DIAGNOSIS — E669 Obesity, unspecified: Secondary | ICD-10-CM

## 2017-06-29 DIAGNOSIS — K220 Achalasia of cardia: Secondary | ICD-10-CM

## 2017-06-29 DIAGNOSIS — T402X5S Adverse effect of other opioids, sequela: Secondary | ICD-10-CM

## 2017-06-29 DIAGNOSIS — Z87891 Personal history of nicotine dependence: Secondary | ICD-10-CM

## 2017-06-29 DIAGNOSIS — I872 Venous insufficiency (chronic) (peripheral): Secondary | ICD-10-CM

## 2017-06-29 DIAGNOSIS — K219 Gastro-esophageal reflux disease without esophagitis: Secondary | ICD-10-CM | POA: Diagnosis not present

## 2017-06-29 DIAGNOSIS — M1711 Unilateral primary osteoarthritis, right knee: Secondary | ICD-10-CM

## 2017-06-29 DIAGNOSIS — K5903 Drug induced constipation: Secondary | ICD-10-CM

## 2017-06-29 DIAGNOSIS — Z6835 Body mass index (BMI) 35.0-35.9, adult: Secondary | ICD-10-CM

## 2017-06-29 DIAGNOSIS — Z79899 Other long term (current) drug therapy: Secondary | ICD-10-CM

## 2017-06-29 DIAGNOSIS — T402X5A Adverse effect of other opioids, initial encounter: Secondary | ICD-10-CM

## 2017-06-29 NOTE — Assessment & Plan Note (Signed)
Assessment  He states the omeprazole 20 mg by mouth daily is effective at controlling his symptomatic gastroesophageal reflux disease.  Plan  We will continue the omeprazole 20 mg by mouth daily and reassess the efficacy of this therapy at the follow-up visit.

## 2017-06-29 NOTE — Assessment & Plan Note (Addendum)
Assessment  He continues to have some swelling of both legs that worsened throughout the day and improves throughout the night. This is consistent with his chronic venous insufficiency. On exam it is unchanged from baseline. He continues with the hydrochlorthiazide as well as the compression stockings. With regards to the compression stockings he is unable to place them on his own and therefore uses them only periodically when his home health aide can assist. He is able to get them off when needed.  Plan  We will continue with the compression stockings as they can be placed as well as the hydrochlorothiazide. We will reassess his chronic venous insufficiency at the follow-up visit.

## 2017-06-29 NOTE — Progress Notes (Signed)
   Subjective:    Patient ID: [name], male    DOB: August 12, 1945, 72 y.o.   MRN: [id number]  HPI  BALTHAZAR DOOLY is here for follow-up of lumbar stenosis, right knee osteoarthritis, essential hypertension, gastroesophageal reflux disease, obesity, chronic venous insufficiency, and constipation due to opioid tehrapy. Please see the A&P for the status of the pt's chronic medical problems.  Review of Systems  Constitutional: Negative for activity change, appetite change and unexpected weight change.  Respiratory: Negative for chest tightness and shortness of breath.   Cardiovascular: Positive for leg swelling. Negative for chest pain and palpitations.  Gastrointestinal: Positive for constipation. Negative for abdominal pain, diarrhea, nausea and vomiting.       Constipation controlled with sorbitol  Genitourinary: Negative for difficulty urinating.  Musculoskeletal: Positive for arthralgias, back pain and gait problem. Negative for joint swelling and myalgias.  Skin: Negative for color change, rash and wound.  Neurological: Negative for dizziness.      Objective:   Physical Exam  Constitutional: He is oriented to person, place, and time. He appears well-developed and well-nourished. No distress.  HENT:  Head: Normocephalic and atraumatic.  Eyes: Conjunctivae are normal. Right eye exhibits no discharge. Left eye exhibits no discharge. No scleral icterus.  Cardiovascular: Normal rate, regular rhythm and normal heart sounds.  Pulmonary/Chest: Effort normal and breath sounds normal. No respiratory distress. He has no wheezes. He has no rales.  Musculoskeletal: Normal range of motion. He exhibits edema and tenderness.  Tenderness to palpation along medial right knee joint line  Neurological: He is alert and oriented to person, place, and time. He exhibits normal muscle tone.  Skin: Skin is warm and dry. No rash noted. He is not diaphoretic. No erythema.  Psychiatric: He has a normal  mood and affect. His behavior is normal. Judgment and thought content normal.  Nursing note and vitals reviewed.     Assessment & Plan:   Please see problem oriented charting.

## 2017-06-29 NOTE — Assessment & Plan Note (Signed)
He is up-to-date on his health care maintenance. 

## 2017-06-29 NOTE — Patient Instructions (Signed)
It was good to see you again.  1) Keep taking the medications as you are.  2) We can get blood and urine tests at the next visit.  3)  Consider changing soda to diet soda.  I bet you will loose weight with this one change.  I will see you back in 6 months, sooner if necessary.

## 2017-06-29 NOTE — Assessment & Plan Note (Signed)
Assessment  He continues to have constipation related to his chronic opioid therapy. He states the sorbitol is effective in managing this.  Plan  We will continue with the sorbitol as needed and reassess the efficacy of this therapy in controlling his constipation at the follow-up visit.

## 2017-06-29 NOTE — Assessment & Plan Note (Signed)
Assessment  His weight is up to 282 pounds which is an increase of 6 pounds from the last month. I discussed how this could be impacting his health, and in particular his right knee osteoarthritis pain. He was comfortable with this weight but I believe understands how it is detrimental to his knee.  Plan  We had a long discussion about modifying one thing in his diet that may result in weight loss. He will consider converting sodas to diet sodas. If he can accomplish this we may see the start of some weight loss. There are other things in his diet which could be changed, were he willing, that would also result in weight loss. We will reassess his weight at the follow-up visit.

## 2017-06-29 NOTE — Assessment & Plan Note (Signed)
Assessment  At the last clinic visit his symptoms were consistent with a recurrence of his achalasia. He was therefore referred to GI. He was seen by GI and they are planning on repeat endoscopy with possible Botox injections scheduled for 07/17/2017.  Plan  He was encouraged to follow-up with the gastroenterologist as scheduled on 07/17/2017 to assess the need for repeat Botox injections for likely achalasia.

## 2017-06-29 NOTE — Assessment & Plan Note (Signed)
Assessment  He continues to have right knee pain which is chronic and unchanged. It remains responsive to as needed ibuprofen. At times it is severe enough that he does need to take the Percocet for his knee pain.  Plan  We had a long discussion about the importance of weight loss as described elsewhere. We will continue with the as needed ibuprofen and Percocet in order to decrease his pain and allow him to be ambulatory around his residence. We will reassess the efficacy of this therapy at the follow-up visit.

## 2017-06-29 NOTE — Assessment & Plan Note (Signed)
Assessment  His chronic low back pain secondary to spinal stenosis with neurogenic claudication is stable on his current regimen of oxycodone-acetaminophen 10-325 mg 1 tablet every 6 hours as needed for pain  #75 per month. We tried to get him a walker that was taller given he is a very tall man. Unfortunately, it appears they provided him with a walker that was shorter than the walker that he had. He continues to use his previous walker.  Plan  We will continue the oxycodone-acetaminophen 10-325 mg 1 tablet every 6 hours as needed for pain dispense #75 per month. We will try to figure out why his new walker is shorter than his previous walker. We tried to obtain urine for a urine drug screen but he had urinated just prior to coming to clinic and was unable to provide a sample. I have a very low suspicion that he is not appropriately using his opiate medication. We will obtain a urine drug screen at a subsequent visit.

## 2017-06-29 NOTE — Assessment & Plan Note (Signed)
Assessment  His blood pressure today was 125/66 on verapamil 180 mg by mouth daily and hydrochlorothiazide 25 mg by mouth daily.  Plan  We will continue the verapamil at 180 mg by mouth daily and hydrochlorothiazide at 25 mg by mouth daily. We will reassess the efficacy of this therapy in controlling his hypertension at the follow-up visit.

## 2017-07-02 ENCOUNTER — Telehealth: Payer: Self-pay | Admitting: *Deleted

## 2017-07-02 NOTE — Telephone Encounter (Signed)
Call made to patient to confirm his request of a "taller" walker with seat (rollator).  Pt states previous rollator was delivered in a box and he was not aware that it was shorter until he had someone put it together. Pt is still using the old rollator at this time.  I will forward info to front office to assist with the request.Shawn Trebilcock Cassady1/7/20199:50 AM

## 2017-07-02 NOTE — Telephone Encounter (Signed)
-----   Message from [name], MD sent at 06/29/2017  6:10 PM EST ----- Regarding: Is there a way to fix this? Dear Ulis Rias,  Mr. Hannan received a walker that was shorter than his previous walker even though we wanted him to get a taller walker given he is a tall man.  Is there a way this can be corrected so he can get an appropriate walker?  Thanks,   Fritz Pickerel

## 2017-07-05 NOTE — Telephone Encounter (Signed)
Called [facility] and verified the Size of the Rollator Walker delivered and could it accommodate patient Height and Weight because the patient states, "It is to Short".  [facility] confirmed the walker is adequate he has is for a patient who is as short as 5'5 and as tall as 6'2 and weighs up to 300 pounds.  If the patient wants to have a Tall Walker or a Heavy Duty [name] it will be considered private pay.   He does not meet the requirements for Medicare.  Called patient and notified him that he does not meet Medicare Guidelines for his request, and if he had anymore questions he can call directly to [facility] @ [phone].

## 2017-07-09 ENCOUNTER — Encounter (HOSPITAL_COMMUNITY): Payer: Self-pay | Admitting: Emergency Medicine

## 2017-07-09 ENCOUNTER — Other Ambulatory Visit: Payer: Self-pay

## 2017-07-11 ENCOUNTER — Ambulatory Visit (INDEPENDENT_AMBULATORY_CARE_PROVIDER_SITE_OTHER): Payer: Medicare Other | Admitting: Podiatry

## 2017-07-11 ENCOUNTER — Encounter: Payer: Self-pay | Admitting: Podiatry

## 2017-07-11 ENCOUNTER — Other Ambulatory Visit: Payer: Self-pay | Admitting: Podiatry

## 2017-07-11 ENCOUNTER — Ambulatory Visit (INDEPENDENT_AMBULATORY_CARE_PROVIDER_SITE_OTHER): Payer: Medicare Other

## 2017-07-11 DIAGNOSIS — Q828 Other specified congenital malformations of skin: Secondary | ICD-10-CM

## 2017-07-11 DIAGNOSIS — M79671 Pain in right foot: Secondary | ICD-10-CM | POA: Diagnosis not present

## 2017-07-11 DIAGNOSIS — M779 Enthesopathy, unspecified: Secondary | ICD-10-CM

## 2017-07-11 DIAGNOSIS — M79675 Pain in left toe(s): Secondary | ICD-10-CM

## 2017-07-11 DIAGNOSIS — B351 Tinea unguium: Secondary | ICD-10-CM

## 2017-07-11 DIAGNOSIS — M79674 Pain in right toe(s): Secondary | ICD-10-CM

## 2017-07-12 NOTE — Progress Notes (Signed)
Subjective:   Patient ID: [name], male   DOB: 72 y.o.   MRN: [id number]   HPI Patient presents with painful lesions on both feet 3 separate lesions on each foot and nail disease 1-5 both feet that are painful    ROS      Objective:  Physical Exam  Neurovascular status intact with numerous keratotic lesions bilateral that are painful when pressed with nail disease 1-5 both feet that are thick yellow brittle and painful     Assessment:  Mycotic nail infection 1-5 both feet with 3 separate lesions on both feet     Plan:  Debridement of nailbeds 1-5 both feet with no iatrogenic bleeding in 3 separate lesions on each foot with no iatrogenic bleeding and reappoint for routine care

## 2017-07-17 ENCOUNTER — Ambulatory Visit (HOSPITAL_COMMUNITY)
Admission: RE | Admit: 2017-07-17 | Discharge: 2017-07-17 | Disposition: A | Discharge status: Home / Self Care | Payer: Medicare Other | Source: Ambulatory Visit | Attending: Gastroenterology | Admitting: Gastroenterology

## 2017-07-17 ENCOUNTER — Other Ambulatory Visit: Payer: Self-pay

## 2017-07-17 ENCOUNTER — Encounter (HOSPITAL_COMMUNITY): Payer: Self-pay | Admitting: Emergency Medicine

## 2017-07-17 ENCOUNTER — Encounter (HOSPITAL_COMMUNITY)
Admission: RE | Disposition: A | Discharge status: Home / Self Care | Payer: Self-pay | Source: Ambulatory Visit | Attending: Gastroenterology

## 2017-07-17 ENCOUNTER — Ambulatory Visit (HOSPITAL_COMMUNITY): Payer: Medicare Other | Admitting: Certified Registered Nurse Anesthetist

## 2017-07-17 DIAGNOSIS — E538 Deficiency of other specified B group vitamins: Secondary | ICD-10-CM | POA: Insufficient documentation

## 2017-07-17 DIAGNOSIS — I872 Venous insufficiency (chronic) (peripheral): Secondary | ICD-10-CM | POA: Diagnosis not present

## 2017-07-17 DIAGNOSIS — K648 Other hemorrhoids: Secondary | ICD-10-CM | POA: Insufficient documentation

## 2017-07-17 DIAGNOSIS — Z79899 Other long term (current) drug therapy: Secondary | ICD-10-CM | POA: Insufficient documentation

## 2017-07-17 DIAGNOSIS — R1310 Dysphagia, unspecified: Secondary | ICD-10-CM | POA: Diagnosis not present

## 2017-07-17 DIAGNOSIS — K220 Achalasia of cardia: Secondary | ICD-10-CM | POA: Diagnosis not present

## 2017-07-17 DIAGNOSIS — Z9842 Cataract extraction status, left eye: Secondary | ICD-10-CM | POA: Insufficient documentation

## 2017-07-17 DIAGNOSIS — I119 Hypertensive heart disease without heart failure: Secondary | ICD-10-CM | POA: Insufficient documentation

## 2017-07-17 DIAGNOSIS — K228 Other specified diseases of esophagus: Secondary | ICD-10-CM | POA: Diagnosis not present

## 2017-07-17 DIAGNOSIS — G8929 Other chronic pain: Secondary | ICD-10-CM | POA: Diagnosis not present

## 2017-07-17 DIAGNOSIS — K219 Gastro-esophageal reflux disease without esophagitis: Secondary | ICD-10-CM | POA: Diagnosis not present

## 2017-07-17 DIAGNOSIS — Z87891 Personal history of nicotine dependence: Secondary | ICD-10-CM | POA: Insufficient documentation

## 2017-07-17 DIAGNOSIS — Z800 Family history of malignant neoplasm of digestive organs: Secondary | ICD-10-CM | POA: Insufficient documentation

## 2017-07-17 DIAGNOSIS — E669 Obesity, unspecified: Secondary | ICD-10-CM | POA: Insufficient documentation

## 2017-07-17 DIAGNOSIS — M48062 Spinal stenosis, lumbar region with neurogenic claudication: Secondary | ICD-10-CM | POA: Diagnosis not present

## 2017-07-17 DIAGNOSIS — M1990 Unspecified osteoarthritis, unspecified site: Secondary | ICD-10-CM | POA: Insufficient documentation

## 2017-07-17 DIAGNOSIS — R1319 Other dysphagia: Secondary | ICD-10-CM

## 2017-07-17 HISTORY — PX: ESOPHAGOGASTRODUODENOSCOPY (EGD) WITH PROPOFOL: SHX5813

## 2017-07-17 HISTORY — PX: BOTOX INJECTION: SHX5754

## 2017-07-17 SURGERY — ESOPHAGOGASTRODUODENOSCOPY (EGD) WITH PROPOFOL
Anesthesia: Monitor Anesthesia Care

## 2017-07-17 MED ORDER — ONABOTULINUMTOXINA 100 UNITS IJ SOLR
INTRAMUSCULAR | Status: AC
  Filled 2017-07-17: qty 100

## 2017-07-17 MED ORDER — PROPOFOL 500 MG/50ML IV EMUL
INTRAVENOUS | Status: DC | PRN
  Administered 2017-07-17: 150 ug/kg/min via INTRAVENOUS

## 2017-07-17 MED ORDER — PROPOFOL 10 MG/ML IV BOLUS
INTRAVENOUS | Status: DC | PRN
  Administered 2017-07-17: 30 mg via INTRAVENOUS

## 2017-07-17 MED ORDER — PROPOFOL 10 MG/ML IV BOLUS
INTRAVENOUS | Status: AC
  Filled 2017-07-17: qty 20

## 2017-07-17 MED ORDER — PROPOFOL 10 MG/ML IV BOLUS
INTRAVENOUS | Status: AC
  Filled 2017-07-17: qty 40

## 2017-07-17 MED ORDER — LIDOCAINE 2% (20 MG/ML) 5 ML SYRINGE
INTRAMUSCULAR | Status: DC | PRN
  Administered 2017-07-17: 100 mg via INTRAVENOUS

## 2017-07-17 MED ORDER — SODIUM CHLORIDE 0.9 % IV SOLN: INTRAVENOUS | Status: DC

## 2017-07-17 MED ORDER — LACTATED RINGERS IV SOLN
INTRAVENOUS | Status: DC | PRN
  Administered 2017-07-17: 07:00:00 via INTRAVENOUS

## 2017-07-17 MED ORDER — ONABOTULINUMTOXINA 100 UNITS IJ SOLR
INTRAMUSCULAR | Status: DC | PRN
  Administered 2017-07-17: 100 [IU] via INTRAMUSCULAR

## 2017-07-17 SURGICAL SUPPLY — 15 items

## 2017-07-17 NOTE — Transfer of Care (Signed)
Immediate Anesthesia Transfer of Care Note  Patient: [name]  Procedure(s) Performed: ESOPHAGOGASTRODUODENOSCOPY (EGD) WITH PROPOFOL (N/A ) BOTOX INJECTION (N/A )  Patient Location: PACU  Anesthesia Type:MAC  Level of Consciousness: drowsy and patient cooperative  Airway & Oxygen Therapy: Patient Spontanous Breathing and Patient connected to nasal cannula oxygen  Post-op Assessment: Report given to RN and Post -op Vital signs reviewed and stable  Post vital signs: Reviewed and stable  Last Vitals:  Vitals:   07/17/17 0722  BP: (!) 145/73  Pulse: 67  Resp: 17  Temp: 36.7 C  SpO2: 100%    Last Pain:  Vitals:   07/17/17 0722  TempSrc: Oral         Complications: No apparent anesthesia complications

## 2017-07-17 NOTE — Interval H&P Note (Signed)
History and Physical Interval Note:  07/17/2017 8:34 AM  [name]  has presented today for surgery, with the diagnosis of Achalasia, Dysphagia tt  The various methods of treatment have been discussed with the patient and family. After consideration of risks, benefits and other options for treatment, the patient has consented to  Procedure(s) with comments: ESOPHAGOGASTRODUODENOSCOPY (EGD) WITH PROPOFOL (N/A) - with BOTOX BOTOX INJECTION (N/A) as a surgical intervention .  The patient's history has been reviewed, patient examined, no change in status, stable for surgery.  I have reviewed the patient's chart and labs.  Questions were answered to the patient's satisfaction.     [name]

## 2017-07-17 NOTE — Op Note (Signed)
[facility] Patient Name: [name] Procedure Date: 07/17/2017 MRN: [id number] Attending MD: Estill Cotta. Loletha Carrow , MD Date of Birth: Jul 08, 1945 CSN: [id number] Age: 72 Admit Type: Outpatient Procedure:                Upper GI endoscopy Indications:              Dysphagia, For botulinum toxin injection of                            achalasia Providers:                Mallie Mussel L. Loletha Carrow, MD, Cleda Daub, RN, Nevin Bloodgood, Technician, Christell Faith, CRNA Referring MD:              Medicines:                Monitored Anesthesia Care Complications:            No immediate complications. Estimated Blood Loss:     Estimated blood loss: none. Procedure:                Pre-Anesthesia Assessment:                           - Prior to the procedure, a History and Physical                            was performed, and patient medications and                            allergies were reviewed. The patient's tolerance of                            previous anesthesia was also reviewed. The risks                            and benefits of the procedure and the sedation                            options and risks were discussed with the patient.                            All questions were answered, and informed consent                            was obtained. Anticoagulants: The patient has taken                            aspirin. It was decided not to withhold this                            medication prior to the procedure. ASA Grade  Assessment: III - A patient with severe systemic                            disease. After reviewing the risks and benefits,                            the patient was deemed in satisfactory condition to                            undergo the procedure.                           After obtaining informed consent, the endoscope was                            passed under direct vision. Throughout  the                            procedure, the patient's blood pressure, pulse, and                            oxygen saturations were monitored continuously. The                            EG-[id number] ([id number]) scope was introduced through the                            mouth, and advanced to the second part of duodenum.                            The upper GI endoscopy was accomplished without                            difficulty. The patient tolerated the procedure                            well. Scope In: Scope Out: Findings:      The lumen of the esophagus was moderately dilated.      Abnormal motility was noted in the esophagus. There are extra       peristaltic waves in the esophageal body. The distal esophagus/lower       esophageal sphincter is spastic, but gives up passage to the endoscope.       Area was successfully injected with 100 units botulinum toxin.      The stomach was normal.      The cardia and gastric fundus were normal on retroflexion.      The examined duodenum was normal. Impression:               - Dilation in the entire esophagus.                           - Abnormal esophageal motility, established                            achalasia. Injected  with botulinum toxin.                           - Normal stomach.                           - Normal examined duodenum.                           - No specimens collected. Moderate Sedation:      MAC sedation used Recommendation:           - Patient has a contact number available for                            emergencies. The signs and symptoms of potential                            delayed complications were discussed with the                            patient. Return to normal activities tomorrow.                            Written discharge instructions were provided to the                            patient.                           - Resume previous diet.                           - Continue present  medications. Procedure Code(s):        --- Professional ---                           [phone], Esophagogastroduodenoscopy, flexible,                            transoral; with directed submucosal injection(s),                            any substance Diagnosis Code(s):        --- Professional ---                           K22.8, Other specified diseases of esophagus                           K22.0, Achalasia of cardia                           R13.10, Dysphagia, unspecified CPT copyright 2016 American Medical Association. All rights reserved. The codes documented in this report are preliminary and upon coder review may  be revised to meet current compliance requirements. Arayla Kruschke L. Loletha Carrow, MD 07/17/2017 9:11:38 AM This report has been signed electronically. Number of Addenda: 0

## 2017-07-17 NOTE — Progress Notes (Signed)
Patient and his neighbor are waiting in our unit until 1030 for transportation to arrive.

## 2017-07-17 NOTE — Anesthesia Preprocedure Evaluation (Addendum)
Anesthesia Evaluation  Patient identified by MRN, date of birth, ID band Patient awake    Reviewed: Allergy & Precautions, H&P , NPO status , Patient's Chart, lab work & pertinent test results  Airway Mallampati: IV   Neck ROM: Full    Dental  (+) Edentulous Upper, Dental Advisory Given   Pulmonary former smoker,    Pulmonary exam normal breath sounds clear to auscultation       Cardiovascular hypertension, Pt. on medications Normal cardiovascular exam+ Valvular Problems/Murmurs AS  Rhythm:Regular Rate:Normal + Systolic murmurs EKG - Septal Q waves, mildly prolonged QTc  TTE 2017 - Mild LVH. EF was in the range of 60% to 65%.   Grade 1 diastolic dysfunction. Moderate AS, trivial AI. Trivial MR, moderately dilated LA.   Neuro/Psych negative neurological ROS  negative psych ROS   GI/Hepatic Neg liver ROS, GERD  Medicated and Controlled,achalasia   Endo/Other  Obesity  Renal/GU negative Renal ROS  negative genitourinary   Musculoskeletal  (+) Arthritis ,   Abdominal   Peds negative pediatric ROS (+)  Hematology negative hematology ROS (+)   Anesthesia Other Findings   Reproductive/Obstetrics negative OB ROS                            Anesthesia Physical  Anesthesia Plan  ASA: III  Anesthesia Plan: MAC   Post-op Pain Management:    Induction:   PONV Risk Score and Plan: Propofol infusion and Treatment may vary due to age or medical condition  Airway Management Planned: Nasal Cannula  Additional Equipment: None  Intra-op Plan:   Post-operative Plan:   Informed Consent: I have reviewed the patients History and Physical, chart, labs and discussed the procedure including the risks, benefits and alternatives for the proposed anesthesia with the patient or authorized representative who has indicated his/her understanding and acceptance.     Plan Discussed with:  CRNA  Anesthesia Plan Comments:         Anesthesia Quick Evaluation

## 2017-07-17 NOTE — H&P (Signed)
History:  This patient presents for endoscopic testing for dysphagia.  [name] Referring physician: [name], MD  Past Medical History: Past Medical History:  Diagnosis Date  . 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  Excellent symptomatic response to LES Botox injections.   . AVM (arteriovenous malformation) of colon 08/26/2013   Cecum X 3, without bleeding on colonoscopy February 2015   . B12 deficiency 10/01/2011   Discovered to 2 progressive neurologic pain and dysfunction.  Diagnosis established March 2013.  Requires the following treatment: B12 IM monthly until level normalizes. After that, oral supplementation.   . Cataract   . Chronic venous insufficiency 11/28/2013  . Constipation due to opioid therapy 03/11/2015  . Diverticulosis 08/26/2013  . Essential hypertension 06/15/2006  . Family history of malignant neoplasm of gastrointestinal tract 06/30/2013   Father with colon cancer age 22   . Gastroesophageal reflux disease 02/04/2007  . Internal hemorrhoids 08/26/2013  . Left posterior subcapsular cataract 10/03/2013   s/p yag laser capsulotomy 10/03/2013   . 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  Complicated by chronic low back pain and neuropathy.  Nerve conduction study (10/19/11): Diffusely low motor amplitudes, with primarily involvement of the peroneal and posterior tibial nerves. No evidence of generalized peripheral neuropathy. Findings could be c/w bil multilevel lumbosacral radiculopathies or a primary motor neuropathy.   . Mild aortic valve stenosis 10/09/2006   Echo (12/09/2009): Valve area: 2.03 cm2   . Neuropathy   . Obesity, Class I, BMI 30-34.9 04/05/2012  . Onychomycosis of toenail 06/05/2014  . Osteoarthritis 11/25/2009    Right knee (medial compartment), right hip, left shoulder   . 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 Trigger little finger of right hand 03/11/2015     Past Surgical History: Past Surgical History:  Procedure Laterality Date  . BOTOX INJECTION N/A 09/24/2013   Procedure: BOTOX INJECTION;  Surgeon: Shawn Castle, MD;  Location: [facility];  Service: Endoscopy;  Laterality: N/A;  . CAPSULOTOMY Left 10/03/2013   Procedure: YAG CAPSULOTOMY OF LEFT EYE;  Surgeon: Shawn Mantis., MD;  Location: [place];  Service: Ophthalmology;  Laterality: Left;  . CATARACT EXTRACTION  05/01/2012   left eye  . CATARACT EXTRACTION W/PHACO  11/08/2011   Procedure: CATARACT EXTRACTION PHACO AND INTRAOCULAR LENS PLACEMENT (IOC);  Surgeon: Shawn Brook, MD;  Location: [place];  Service: Ophthalmology;  Laterality: Right;  . CATARACT EXTRACTION W/PHACO  05/01/2012   Procedure: CATARACT EXTRACTION PHACO AND INTRAOCULAR LENS PLACEMENT (IOC);  Surgeon: Shawn Brook, MD;  Location: [place];  Service: Ophthalmology;  Laterality: Left;  . colonscopy    . ESOPHAGEAL MANOMETRY N/A 08/25/2013   Procedure: ESOPHAGEAL MANOMETRY (EM);  Surgeon: Shawn Castle, MD;  Location: [facility];  Service: Endoscopy;  Laterality: N/A;  . ESOPHAGOGASTRODUODENOSCOPY N/A 09/24/2013   Procedure: ESOPHAGOGASTRODUODENOSCOPY (EGD);  Surgeon: Shawn Castle, MD;  Location: [facility];  Service: Endoscopy;  Laterality: N/A;  . ESOPHAGOGASTRODUODENOSCOPY N/A 02/17/2014   Procedure: ESOPHAGOGASTRODUODENOSCOPY (EGD);  Surgeon: Shawn Castle, MD;  Location: [facility];  Service: Endoscopy;  Laterality: N/A;  . EYE SURGERY     cat ext right  . FRACTURE SURGERY     left shoulder  . GAS/FLUID EXCHANGE Right 07/02/2013  Procedure: GAS/FLUID EXCHANGE;  Surgeon: Shawn Brook, MD;  Location: [place];  Service: Ophthalmology;  Laterality: Right;  . PARS PLANA VITRECTOMY Right 07/02/2013    Procedure: PARS PLANA VITRECTOMY WITH 23 GAUGE WITH MEMBRANE PEEL AND ENDOLASER AND SILICONE OIL;  Surgeon: Shawn Brook, MD;  Location: [place];  Service: Ophthalmology;  Laterality: Right;  . SCLERAL BUCKLE Right 03/15/2013   Procedure: SCLERAL BUCKLE with 23Ga Vit;  Surgeon: Shawn Brook, MD;  Location: [place];  Service: Ophthalmology;  Laterality: Right;    Allergies: Allergies  Allergen Reactions  . Amlodipine Swelling and Other (See Comments)    Bilateral lower extremity swelling    Outpatient Meds: Current Facility-Administered Medications  Medication Dose Route Frequency Provider Last Rate Last Dose  . 0.9 %  sodium chloride infusion   Intravenous Continuous Danis, Kirke Corin, MD       Facility-Administered Medications Ordered in Other Encounters  Medication Dose Route Frequency Provider Last Rate Last Dose  . lactated ringers infusion    Continuous PRN Shawn Pugh, CRNA          ___________________________________________________________________ Objective   Exam:  BP (!) 145/73   Pulse 67   Temp 98.1 F (36.7 C) (Oral)   Resp 17   Ht 6\' 2"  (1.88 m)   Wt 282 lb (127.9 kg)   SpO2 100%   BMI 36.21 kg/m    CV: RRR without murmur, S1/S2, no JVD, no peripheral edema  Resp: clear to auscultation bilaterally, normal RR and effort noted  GI: soft, no tenderness, with active bowel sounds. No guarding or palpable organomegaly noted.  Neuro: awake, alert and oriented x 3. Normal gross motor function and fluent speech   Assessment:  Dysphagia - achalasia  Plan:  EGD with BoTox of LES   [name]

## 2017-07-17 NOTE — Discharge Instructions (Signed)
YOU HAD AN ENDOSCOPIC PROCEDURE TODAY: Refer to the procedure report and other information in the discharge instructions given to you for any specific questions about what was found during the examination. If this information does not answer your questions, please call [place] office at [phone] to clarify.  ° °YOU SHOULD EXPECT: Some feelings of bloating in the abdomen. Passage of more gas than usual. Walking can help get rid of the air that was put into your GI tract during the procedure and reduce the bloating. If you had a lower endoscopy (such as a colonoscopy or flexible sigmoidoscopy) you may notice spotting of blood in your stool or on the toilet paper. Some abdominal soreness may be present for a day or two, also. ° °DIET: Your first meal following the procedure should be a light meal and then it is ok to progress to your normal diet. A half-sandwich or bowl of soup is an example of a good first meal. Heavy or fried foods are harder to digest and may make you feel nauseous or bloated. Drink plenty of fluids but you should avoid alcoholic beverages for 24 hours. If you had a esophageal dilation, please see attached instructions for diet.   ° °ACTIVITY: Your care partner should take you home directly after the procedure. You should plan to take it easy, moving slowly for the rest of the day. You can resume normal activity the day after the procedure however YOU SHOULD NOT DRIVE, use power tools, machinery or perform tasks that involve climbing or major physical exertion for 24 hours (because of the sedation medicines used during the test).  ° °SYMPTOMS TO REPORT IMMEDIATELY: °A gastroenterologist can be reached at any hour. Please call [phone]  for any of the following symptoms:  °Following lower endoscopy (colonoscopy, flexible sigmoidoscopy) °Excessive amounts of blood in the stool  °Significant tenderness, worsening of abdominal pains  °Swelling of the abdomen that is new, acute  °Fever of 100° or  higher  °Following upper endoscopy (EGD, EUS, ERCP, esophageal dilation) °Vomiting of blood or coffee ground material  °New, significant abdominal pain  °New, significant chest pain or pain under the shoulder blades  °Painful or persistently difficult swallowing  °New shortness of breath  °Black, tarry-looking or red, bloody stools ° °FOLLOW UP:  °If any biopsies were taken you will be contacted by phone or by letter within the next 1-3 weeks. Call [phone]  if you have not heard about the biopsies in 3 weeks.  °Please also call with any specific questions about appointments or follow up tests. ° °

## 2017-07-17 NOTE — Anesthesia Postprocedure Evaluation (Signed)
Anesthesia Post Note  Patient: [name]  Procedure(s) Performed: ESOPHAGOGASTRODUODENOSCOPY (EGD) WITH PROPOFOL (N/A ) BOTOX INJECTION (N/A )     Patient location during evaluation: PACU Anesthesia Type: MAC Level of consciousness: awake and alert Pain management: pain level controlled Vital Signs Assessment: post-procedure vital signs reviewed and stable Respiratory status: spontaneous breathing, nonlabored ventilation and respiratory function stable Cardiovascular status: stable and blood pressure returned to baseline Anesthetic complications: no    Last Vitals:  Vitals:   07/17/17 0930 07/17/17 0940  BP: 125/64 (!) 145/80  Pulse: 65 72  Resp: 12 14  Temp:  36.6 C  SpO2: 99% 94%    Last Pain:  Vitals:   07/17/17 0940  TempSrc: Oral                 Audry Pili

## 2017-07-17 NOTE — Anesthesia Procedure Notes (Signed)
Procedure Name: MAC Date/Time: 07/17/2017 8:45 AM Performed by: West Pugh, CRNA Pre-anesthesia Checklist: Patient identified, Emergency Drugs available, Suction available, Patient being monitored and Timeout performed Patient Re-evaluated:Patient Re-evaluated prior to induction Oxygen Delivery Method: Nasal cannula Placement Confirmation: CO2 detector and positive ETCO2 Dental Injury: Teeth and Oropharynx as per pre-operative assessment

## 2017-07-19 ENCOUNTER — Encounter (HOSPITAL_COMMUNITY): Payer: Self-pay | Admitting: Gastroenterology

## 2017-08-13 ENCOUNTER — Ambulatory Visit: Payer: Medicare HMO | Admitting: Podiatry

## 2017-08-14 ENCOUNTER — Ambulatory Visit (INDEPENDENT_AMBULATORY_CARE_PROVIDER_SITE_OTHER): Payer: Medicare Other | Admitting: Podiatry

## 2017-08-14 ENCOUNTER — Encounter: Payer: Self-pay | Admitting: Podiatry

## 2017-08-14 DIAGNOSIS — B351 Tinea unguium: Secondary | ICD-10-CM

## 2017-08-14 DIAGNOSIS — Q828 Other specified congenital malformations of skin: Secondary | ICD-10-CM

## 2017-08-14 DIAGNOSIS — M79674 Pain in right toe(s): Secondary | ICD-10-CM | POA: Diagnosis not present

## 2017-08-14 DIAGNOSIS — M79675 Pain in left toe(s): Secondary | ICD-10-CM | POA: Diagnosis not present

## 2017-08-14 NOTE — Progress Notes (Signed)
This patient presents to the office with chief complaint of painful long thick nails.  He also says he has multiple painful calluses on the bottom of both feet.  He says his nails and his calluses are painful, causing him to be in pain when he walks.  He presents to the office today for preventative foot care services.   General Appearance  Alert, conversant and in no acute stress.  Vascular  Dorsalis pedis and posterior pulses are weakly  palpable  bilaterally.  Capillary return is within normal limits  bilaterally. Temperature is within normal limits  Bilaterally.  Neurologic  Senn-Weinstein monofilament wire test within normal limits  bilaterally. Muscle power within normal limits bilaterally.  Nails Thick disfigured discolored nails with subungual debris bilaterally from hallux to fifth toes bilaterally. No evidence of bacterial infection or drainage bilaterally.Pes planus foot type.  Orthopedic  No limitations of motion of motion feet bilaterally.  No crepitus or effusions noted.  No bony pathology or digital deformities noted.  Skin  normotropic skin.   Multiple porokeratosis lesions  B/L.  Severely painful corn fifth toe right foot.  No signs of infections or ulcers noted.     Onychomycosis  B/L.  Porokeratosis  Forefeet  B/L.  Corn fifth toe right foot   Debridement of nails  B/L.  Debridement of multiple porokeratosis  B/L   RTC 10 weeks.   Chirsty Armistead DPM 

## 2017-08-28 ENCOUNTER — Other Ambulatory Visit: Payer: Self-pay

## 2017-08-28 ENCOUNTER — Encounter: Payer: Self-pay | Admitting: Internal Medicine

## 2017-08-28 ENCOUNTER — Ambulatory Visit (INDEPENDENT_AMBULATORY_CARE_PROVIDER_SITE_OTHER): Payer: Medicare Other | Admitting: Internal Medicine

## 2017-08-28 VITALS — BP 133/78 | HR 77 | Temp 98.4°F

## 2017-08-28 DIAGNOSIS — M48062 Spinal stenosis, lumbar region with neurogenic claudication: Secondary | ICD-10-CM

## 2017-08-28 DIAGNOSIS — R1031 Right lower quadrant pain: Secondary | ICD-10-CM | POA: Diagnosis not present

## 2017-08-28 DIAGNOSIS — R109 Unspecified abdominal pain: Secondary | ICD-10-CM

## 2017-08-28 DIAGNOSIS — Z79891 Long term (current) use of opiate analgesic: Secondary | ICD-10-CM | POA: Diagnosis not present

## 2017-08-28 NOTE — Patient Instructions (Addendum)
Thank you for allowing [place] to provide your care. Please follow-up with your PCP, Dr. Eppie Gibson in 3-6 months. If the pain returns you can try you pain pill or the gas relief pill. If things do not improve please seek further care.

## 2017-08-28 NOTE — Assessment & Plan Note (Signed)
Patient struggles with lumbar stenosis and is on chronic opiate therapy. During last visit urine drug screen was not able to be obtained due to the patient just using the bathroom. Will obtain a toxicology screen today. Patient last took his oxycodone this morning.

## 2017-08-28 NOTE — Progress Notes (Signed)
   CC: Right flank pain  HPI:  [name] is a 72 y.o. who presented to the clinic with right flank pain. The patient states that the pain started on Saturday, 08/25/17. The pain was made worse with movement in any activity that increased abdominal pressure. The pain persisted all day Sunday and resolved Sunday evening. He has since been pain free but came to the clinic for further evaluation as he is concerned that the pain may return. The pain was not made worse with eating. The patient was concerned that it was actually gas pain so he took the gas relief pill however it did not provide immediate relief. He did not try any other medications to relieve his pain. He denies fevers/chills, pain worse with eating, constipation/diarrhea, hematuria. He denies a history of abdominal surgeries or kidney stones. He denies history of liver disease.  Past Medical History:  Diagnosis Date  . 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  Excellent symptomatic response to LES Botox injections.   . AVM (arteriovenous malformation) of colon 08/26/2013   Cecum X 3, without bleeding on colonoscopy February 2015   . B12 deficiency 10/01/2011   Discovered to 2 progressive neurologic pain and dysfunction.  Diagnosis established March 2013.  Requires the following treatment: B12 IM monthly until level normalizes. After that, oral supplementation.   . Cataract   . Chronic venous insufficiency 11/28/2013  . Constipation due to opioid therapy 03/11/2015  . Diverticulosis 08/26/2013  . Essential hypertension 06/15/2006  . Family history of malignant neoplasm of gastrointestinal tract 06/30/2013   Father with colon cancer age 81   . Gastroesophageal reflux disease 02/04/2007  . Internal hemorrhoids 08/26/2013  . Left posterior subcapsular cataract 10/03/2013   s/p yag laser capsulotomy 10/03/2013   . 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  Complicated by chronic low back pain and neuropathy.  Nerve conduction study (10/19/11): Diffusely low motor amplitudes, with primarily involvement of the peroneal and posterior tibial nerves. No evidence of generalized peripheral neuropathy. Findings could be c/w bil multilevel lumbosacral radiculopathies or a primary motor neuropathy.   . Mild aortic valve stenosis 10/09/2006   Echo (12/09/2009): Valve area: 2.03 cm2   . Neuropathy   . Obesity, Class I, BMI 30-34.9 04/05/2012  . Onychomycosis of toenail 06/05/2014  . Osteoarthritis 11/25/2009   Right knee (medial compartment), right hip, left shoulder   . 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 Trigger little finger of right hand 03/11/2015   Review of Systems:   12 point ROS preformed. All negative aside from those mentioned in the HPI.  Physical Exam: Vitals:   08/28/17 1437  BP: 133/78  Pulse: 77  SpO2: 100%   General: Well nourished male in no acute distress HENT: Normocephalic, atraumatic, moist mucus membranes Pulm: Good air movement with no wheezing or crackles  CV: RRR, no murmurs, no rubs  Abdomen: Active bowel sounds, firm and distended, no tenderness to palpation  Extremities: Pulses palpable in all extremities, Mild to moderate LE edema bilaterally  Skin: Warm and dry  Neuro: Alert and oriented x 3  Assessment & Plan:   See Encounters Tab for problem based charting.  Patient discussed with Dr. Dareen Piano

## 2017-08-28 NOTE — Addendum Note (Signed)
Addended by: Aldine Contes on: 08/28/2017 03:51 PM   Modules accepted: Level of Service

## 2017-08-28 NOTE — Progress Notes (Signed)
Internal Medicine Clinic Attending  Case discussed with Dr. Helberg at the time of the visit.  We reviewed the resident's history and exam and pertinent patient test results.  I agree with the assessment, diagnosis, and plan of care documented in the resident's note.    

## 2017-08-28 NOTE — Assessment & Plan Note (Signed)
The patient's right flank pain subsequently resolved. The differential for right flank pain is broad. Given that the patient's pain has subsequently resolved we will not do any further investigation at this point. Patient advised to continue his Gas-X pill. He was told to call or come back to the clinic for further evaluation if the pain does recur.

## 2017-09-03 ENCOUNTER — Other Ambulatory Visit: Payer: Self-pay | Admitting: Internal Medicine

## 2017-09-03 DIAGNOSIS — M1711 Unilateral primary osteoarthritis, right knee: Secondary | ICD-10-CM

## 2017-09-03 LAB — TOXASSURE SELECT,+ANTIDEPR,UR

## 2017-09-03 MED ORDER — OXYCODONE-ACETAMINOPHEN 10-325 MG PO TABS: 1.0000 | ORAL_TABLET | Freq: Three times a day (TID) | ORAL | 0 refills | Status: DC | PRN

## 2017-09-20 DIAGNOSIS — T85398A Other mechanical complication of other ocular prosthetic devices, implants and grafts, initial encounter: Secondary | ICD-10-CM | POA: Diagnosis not present

## 2017-09-20 DIAGNOSIS — H43822 Vitreomacular adhesion, left eye: Secondary | ICD-10-CM | POA: Diagnosis not present

## 2017-09-20 DIAGNOSIS — H338 Other retinal detachments: Secondary | ICD-10-CM | POA: Diagnosis not present

## 2017-10-02 ENCOUNTER — Other Ambulatory Visit: Payer: Self-pay

## 2017-10-02 ENCOUNTER — Other Ambulatory Visit: Payer: Self-pay | Admitting: *Deleted

## 2017-10-02 DIAGNOSIS — M48062 Spinal stenosis, lumbar region with neurogenic claudication: Secondary | ICD-10-CM

## 2017-10-02 MED ORDER — GABAPENTIN 300 MG PO CAPS: 600.0000 mg | ORAL_CAPSULE | Freq: Three times a day (TID) | ORAL | 3 refills | Status: DC

## 2017-10-02 NOTE — Telephone Encounter (Signed)
Next appt scheduled 12/21/17 with PCP.

## 2017-10-02 NOTE — Telephone Encounter (Signed)
This has been sent to mail order, there is no override. Attempted to call adell back, no answer, no vmail

## 2017-10-02 NOTE — Telephone Encounter (Signed)
Adell is the service coordinator for the apartment complex requesting gabapentin (NEURONTIN) 300 MG capsule, to be filled  @ [place]. States pt will not have enough med until the deliver date. Asking for mail ordered override. Please call back.

## 2017-10-09 DIAGNOSIS — H338 Other retinal detachments: Secondary | ICD-10-CM | POA: Diagnosis not present

## 2017-10-09 DIAGNOSIS — H44751 Retained (nonmagnetic) (old) foreign body in vitreous body, right eye: Secondary | ICD-10-CM | POA: Diagnosis not present

## 2017-10-09 DIAGNOSIS — T85328A Displacement of other ocular prosthetic devices, implants and grafts, initial encounter: Secondary | ICD-10-CM | POA: Diagnosis not present

## 2017-10-09 DIAGNOSIS — T85398A Other mechanical complication of other ocular prosthetic devices, implants and grafts, initial encounter: Secondary | ICD-10-CM | POA: Diagnosis not present

## 2017-10-10 ENCOUNTER — Other Ambulatory Visit: Payer: Medicare Other

## 2017-10-22 DIAGNOSIS — H43822 Vitreomacular adhesion, left eye: Secondary | ICD-10-CM | POA: Diagnosis not present

## 2017-10-22 DIAGNOSIS — H35341 Macular cyst, hole, or pseudohole, right eye: Secondary | ICD-10-CM | POA: Diagnosis not present

## 2017-10-23 ENCOUNTER — Encounter: Payer: Self-pay | Admitting: Podiatry

## 2017-10-23 ENCOUNTER — Ambulatory Visit (INDEPENDENT_AMBULATORY_CARE_PROVIDER_SITE_OTHER): Payer: Medicare Other | Admitting: Podiatry

## 2017-10-23 DIAGNOSIS — M79675 Pain in left toe(s): Secondary | ICD-10-CM

## 2017-10-23 DIAGNOSIS — L84 Corns and callosities: Secondary | ICD-10-CM

## 2017-10-23 DIAGNOSIS — M79674 Pain in right toe(s): Secondary | ICD-10-CM | POA: Diagnosis not present

## 2017-10-23 DIAGNOSIS — Q828 Other specified congenital malformations of skin: Secondary | ICD-10-CM

## 2017-10-23 DIAGNOSIS — B351 Tinea unguium: Secondary | ICD-10-CM

## 2017-10-23 NOTE — Progress Notes (Signed)
This patient presents to the office with chief complaint of painful long thick nails.  He also says he has multiple painful calluses on the bottom of both feet.  He says his nails and his calluses are painful, causing him to be in pain when he walks.  He presents to the office today for preventative foot care services.   General Appearance  Alert, conversant and in no acute stress.  Vascular  Dorsalis pedis and posterior pulses are weakly  palpable  bilaterally.  Capillary return is within normal limits  bilaterally. Temperature is within normal limits  Bilaterally.  Neurologic  Senn-Weinstein monofilament wire test within normal limits  bilaterally. Muscle power within normal limits bilaterally.  Nails Thick disfigured discolored nails with subungual debris bilaterally from hallux to fifth toes bilaterally. No evidence of bacterial infection or drainage bilaterally.Pes planus foot type.  Orthopedic  No limitations of motion of motion feet bilaterally.  No crepitus or effusions noted.  No bony pathology or digital deformities noted.  Skin  normotropic skin.   Multiple porokeratosis lesions  B/L.  Severely painful corn fifth toe right foot.  No signs of infections or ulcers noted.     Onychomycosis  B/L.  Porokeratosis  Forefeet  B/L.  Corn fifth toe right foot   Debridement of nails  B/L.  Debridement of multiple porokeratosis  B/L   RTC 10 weeks.   Amalie Koran DPM 

## 2017-11-28 ENCOUNTER — Encounter: Payer: Self-pay | Admitting: *Deleted

## 2017-12-06 ENCOUNTER — Other Ambulatory Visit: Payer: Self-pay | Admitting: Internal Medicine

## 2017-12-06 DIAGNOSIS — I10 Essential (primary) hypertension: Secondary | ICD-10-CM

## 2017-12-07 ENCOUNTER — Other Ambulatory Visit: Payer: Self-pay | Admitting: Internal Medicine

## 2017-12-07 DIAGNOSIS — M1711 Unilateral primary osteoarthritis, right knee: Secondary | ICD-10-CM

## 2017-12-07 MED ORDER — OXYCODONE-ACETAMINOPHEN 10-325 MG PO TABS: 1.0000 | ORAL_TABLET | Freq: Three times a day (TID) | ORAL | 0 refills | Status: DC | PRN

## 2017-12-17 ENCOUNTER — Ambulatory Visit (INDEPENDENT_AMBULATORY_CARE_PROVIDER_SITE_OTHER): Payer: Medicare Other | Admitting: Internal Medicine

## 2017-12-17 ENCOUNTER — Other Ambulatory Visit: Payer: Self-pay

## 2017-12-17 ENCOUNTER — Encounter: Payer: Self-pay | Admitting: Internal Medicine

## 2017-12-17 VITALS — BP 112/58 | HR 66 | Temp 98.4°F | Wt 280.0 lb

## 2017-12-17 DIAGNOSIS — E669 Obesity, unspecified: Secondary | ICD-10-CM | POA: Diagnosis not present

## 2017-12-17 DIAGNOSIS — Z79891 Long term (current) use of opiate analgesic: Secondary | ICD-10-CM

## 2017-12-17 DIAGNOSIS — K220 Achalasia of cardia: Secondary | ICD-10-CM

## 2017-12-17 DIAGNOSIS — R011 Cardiac murmur, unspecified: Secondary | ICD-10-CM

## 2017-12-17 DIAGNOSIS — T402X5S Adverse effect of other opioids, sequela: Secondary | ICD-10-CM | POA: Diagnosis not present

## 2017-12-17 DIAGNOSIS — K5903 Drug induced constipation: Secondary | ICD-10-CM | POA: Diagnosis not present

## 2017-12-17 DIAGNOSIS — L853 Xerosis cutis: Secondary | ICD-10-CM | POA: Diagnosis not present

## 2017-12-17 DIAGNOSIS — M48062 Spinal stenosis, lumbar region with neurogenic claudication: Secondary | ICD-10-CM | POA: Diagnosis not present

## 2017-12-17 DIAGNOSIS — I10 Essential (primary) hypertension: Secondary | ICD-10-CM

## 2017-12-17 DIAGNOSIS — Z6835 Body mass index (BMI) 35.0-35.9, adult: Secondary | ICD-10-CM

## 2017-12-17 DIAGNOSIS — I872 Venous insufficiency (chronic) (peripheral): Secondary | ICD-10-CM | POA: Diagnosis not present

## 2017-12-17 DIAGNOSIS — Z79899 Other long term (current) drug therapy: Secondary | ICD-10-CM

## 2017-12-17 DIAGNOSIS — L308 Other specified dermatitis: Secondary | ICD-10-CM | POA: Diagnosis not present

## 2017-12-17 DIAGNOSIS — M1711 Unilateral primary osteoarthritis, right knee: Secondary | ICD-10-CM | POA: Diagnosis not present

## 2017-12-17 DIAGNOSIS — T402X5A Adverse effect of other opioids, initial encounter: Secondary | ICD-10-CM

## 2017-12-17 DIAGNOSIS — Z791 Long term (current) use of non-steroidal anti-inflammatories (NSAID): Secondary | ICD-10-CM

## 2017-12-17 MED ORDER — HYDROCORTISONE 1 % EX CREA: TOPICAL_CREAM | CUTANEOUS | 1 refills | Status: DC

## 2017-12-17 NOTE — Assessment & Plan Note (Signed)
ssessment  He states that his constipation related to chronic opioid use is well controlled on the as needed sorbitol and he is quite happy with this regimen.  Plan  We will continue the as needed sorbitol for his opioid related constipation. We will reassess the efficacy of this as needed therapy at the follow-up visit.

## 2017-12-17 NOTE — Assessment & Plan Note (Signed)
Assessment  He continues to have chronic lower back pain associated with neurogenic claudication but it is well controlled on as needed Percocet and gabapentin 600 mg by mouth 3 times daily.  Plan  We will continue the Percocet as needed and the gabapentin at 600 mg by mouth 3 times daily and reassess the control of his symptoms on this regimen at the follow-up visit.

## 2017-12-17 NOTE — Progress Notes (Signed)
   Subjective:    Patient ID: [name], male    DOB: August 11, 1945, 72 y.o.   MRN: [id number]  HPI  [name] is here for follow-up of his lumbar stenosis with neurogenic claudication, achalasia, osteoarthritis of the right knee and hand, obesity, essential hypertension, constipation due to opioid therapy, chronic venous insufficiency, and asteatotic eczema. Please see the A&P for the status of the pt's chronic medical problems.  Review of Systems  Constitutional: Negative for activity change and unexpected weight change.  Respiratory: Negative for chest tightness, shortness of breath and wheezing.   Cardiovascular: Positive for leg swelling. Negative for chest pain and palpitations.  Gastrointestinal: Negative for abdominal distention, abdominal pain, constipation, diarrhea, nausea and vomiting.  Genitourinary: Negative for difficulty urinating.  Musculoskeletal: Positive for arthralgias and back pain. Negative for joint swelling and myalgias.  Skin: Positive for rash.       Dry skin left shin.      Objective:   Physical Exam  Constitutional: He is oriented to person, place, and time. He appears well-developed and well-nourished. No distress.  HENT:  Head: Normocephalic and atraumatic.  Eyes: Conjunctivae are normal. Right eye exhibits no discharge. Left eye exhibits no discharge. No scleral icterus.  Cardiovascular: Normal rate and regular rhythm. Exam reveals no gallop and no friction rub.  Murmur heard. Pulmonary/Chest: Effort normal and breath sounds normal. No stridor. No respiratory distress. He has no wheezes. He has no rales.  Abdominal: Soft. Bowel sounds are normal. He exhibits no distension. There is no tenderness. There is no rebound and no guarding.  Musculoskeletal: Normal range of motion. He exhibits edema. He exhibits no tenderness or deformity.  Neurological: He is alert and oriented to person, place, and time. He exhibits normal muscle tone.  Skin: Skin is  warm and dry. Rash noted. He is not diaphoretic. No erythema.  Dry skin on left shin.  Psychiatric: He has a normal mood and affect. His behavior is normal. Judgment and thought content normal.  Nursing note and vitals reviewed.     Assessment & Plan:   Please see problem based charting.

## 2017-12-17 NOTE — Assessment & Plan Note (Signed)
Assessment  His weight is down 2 pounds in the last 6 months.  Plan  He was congratulated on his ability to lose 2 pounds and was encouraged to continue to do so. We will reassess his weight, and thus this excess in managing his portion sizes at the follow-up visit.

## 2017-12-17 NOTE — Assessment & Plan Note (Signed)
Assessment  At the last clinic visit in early January he was noted to have signs and symptoms most consistent with recurrence of his achalasia. He was referred to GI who evaluated the patient and felt he had spasms of the lower esophageal sphincter consistent with achalasia. He therefore underwent Botox injections at the lower esophageal sphincter.  He has had an excellent result.  Plan  We will continue to monitor for signs or symptoms of recurrence of his achalasia. He has been relatively well controlled for over 4 years in between treatments.

## 2017-12-17 NOTE — Patient Instructions (Signed)
It was good to see you again.  You are taking very good care of yourself.  1) I started hydrocortisone 1% cream to be applied to your leg when itching up to 2 times a day.  2) Keep taking you other medications as you are.  3) Keep your legs up when you can during the day to decrease the swelling.  4) Keep putting lotion on your leg everyday to help with the dry skin.  5) Take the ibuprofen 800 mg up to three times a day for pain in the hands or knees.  I will see you back in 6 months, sooner if necessary.

## 2017-12-17 NOTE — Assessment & Plan Note (Signed)
He is currently up-to-date on his health care maintenance. 

## 2017-12-17 NOTE — Assessment & Plan Note (Signed)
Assessment  He has some evidence of xerosis of the skin related to dryness, particular he over the left shin.There may be a component of asked you tonic eczema as well. The chronic venous insufficiency is not helping the situation in he has been inconsistent with the use of his usurping cream over this area. I discussed the importance of keeping the skin well lubricated so that it does not become dry and therefore itchy.  Plan  He will use his lower extremity skin cream on a regular basis rather than as needed. He was given hydrocortisone 1% cream to be applied to the left shin when the itching is problematic. We will reassess the efficacy of the emollient as well as the as needed hydrocortisone cream at the follow-up visit.

## 2017-12-17 NOTE — Assessment & Plan Note (Signed)
Assessment  He continues to take as needed ibuprofen for his right knee osteoarthritis with decent results. He has noted some increased symptoms in his right hand, but has not paid attention as to whether or not the ibuprofen has controlled the symptoms well.  Plan  He will continue the ibuprofen 800 mg by mouth every 8 hours as needed and pay attention to see if the medication is effective at controlling the symptoms in his right hand which he considers to be mild. If not, we could consider switching his nonsteroidal anti-inflammatory agent at the follow-up visit.

## 2017-12-17 NOTE — Assessment & Plan Note (Signed)
Assessment  He continues to have some swelling of the lower extremities, left greater than right. This develops over the day and resolves overnight. On examination, I actually feel the right is slightly more edematous than the left today. Despite this, the history is very suggestive of his chronic venous insufficiency still at play. He has been tolerating the hydrochlorothiazide 25 mg by mouth daily which may be helping with this somewhat. He does have compression stockings, but is unable to put them on himself and therefore rarely uses them. He would have no trouble taking them off, but does not have someone to help him in the morning when he wakes up and therefore very rarely is able to have these placed. He does not have evidence of a venous stasis ulcer on either leg, although he does have hemosiderin deposition.  Plan  I encouraged him to use the compression stockings when he did have available help to put them on. He is also to continue the hydrochlorothiazide at 25 mg by mouth daily. I also stressed the importance of keeping his legs elevated if he was at home sitting where this possible. In order to affect the pruritus that he has on the left shin we will need to decrease the chronic venous insufficiency in anyway possible. We will reassess the conservative measures to control his chronic venous insufficiency at the follow-up visit.

## 2017-12-17 NOTE — Assessment & Plan Note (Signed)
Assessment  His blood pressure is well controlled today at 112/58. This is on verapamil 180 mg by mouth daily and hydrochlorothiazide 25 mg by mouth daily.  Plan  We will continue the verapamil at 180 mg by mouth daily and hydrochlorothiazide at 25 mg by mouth daily and reassess the blood pressure at the follow-up visit.

## 2017-12-21 ENCOUNTER — Ambulatory Visit: Payer: Medicare Other | Admitting: Internal Medicine

## 2018-01-22 ENCOUNTER — Encounter: Payer: Self-pay | Admitting: Podiatry

## 2018-01-22 ENCOUNTER — Ambulatory Visit (INDEPENDENT_AMBULATORY_CARE_PROVIDER_SITE_OTHER): Payer: Medicare Other | Admitting: Podiatry

## 2018-01-22 DIAGNOSIS — Q828 Other specified congenital malformations of skin: Secondary | ICD-10-CM | POA: Diagnosis not present

## 2018-01-22 DIAGNOSIS — B351 Tinea unguium: Secondary | ICD-10-CM

## 2018-01-22 DIAGNOSIS — M79675 Pain in left toe(s): Secondary | ICD-10-CM | POA: Diagnosis not present

## 2018-01-22 DIAGNOSIS — M79674 Pain in right toe(s): Secondary | ICD-10-CM

## 2018-01-22 DIAGNOSIS — L84 Corns and callosities: Secondary | ICD-10-CM

## 2018-01-22 NOTE — Progress Notes (Signed)
This patient presents to the office with chief complaint of painful long thick nails.  He also says he has multiple painful calluses on the bottom of both feet.  He says his nails and his calluses are painful, causing him to be in pain when he walks.  He presents to the office today for preventative foot care services.   General Appearance  Alert, conversant and in no acute stress.  Vascular  Dorsalis pedis and posterior pulses are weakly  palpable  bilaterally.  Capillary return is within normal limits  bilaterally. Temperature is within normal limits  Bilaterally.  Neurologic  Senn-Weinstein monofilament wire test within normal limits  bilaterally. Muscle power within normal limits bilaterally.  Nails Thick disfigured discolored nails with subungual debris bilaterally from hallux to fifth toes bilaterally. No evidence of bacterial infection or drainage bilaterally.Pes planus foot type.  Orthopedic  No limitations of motion of motion feet bilaterally.  No crepitus or effusions noted.  No bony pathology or digital deformities noted.  Skin  normotropic skin.   Multiple porokeratosis lesions  B/L.  Severely painful corn fifth toe right foot.  No signs of infections or ulcers noted.     Onychomycosis  B/L.  Porokeratosis  Forefeet  B/L.  Corn fifth toe right foot   Debridement of nails  B/L.  Debridement of multiple porokeratosis  B/L   RTC 10 weeks.   Gardiner Barefoot DPM

## 2018-02-05 ENCOUNTER — Other Ambulatory Visit: Payer: Self-pay | Admitting: Internal Medicine

## 2018-02-05 DIAGNOSIS — I10 Essential (primary) hypertension: Secondary | ICD-10-CM

## 2018-02-11 ENCOUNTER — Other Ambulatory Visit: Payer: Self-pay | Admitting: Internal Medicine

## 2018-02-11 DIAGNOSIS — K219 Gastro-esophageal reflux disease without esophagitis: Secondary | ICD-10-CM

## 2018-02-13 ENCOUNTER — Encounter: Payer: Self-pay | Admitting: Internal Medicine

## 2018-02-13 ENCOUNTER — Other Ambulatory Visit: Payer: Self-pay

## 2018-02-13 ENCOUNTER — Ambulatory Visit (INDEPENDENT_AMBULATORY_CARE_PROVIDER_SITE_OTHER): Payer: Medicare Other | Admitting: Internal Medicine

## 2018-02-13 VITALS — BP 144/64 | HR 71 | Temp 98.2°F | Wt 276.1 lb

## 2018-02-13 DIAGNOSIS — Z79899 Other long term (current) drug therapy: Secondary | ICD-10-CM

## 2018-02-13 DIAGNOSIS — R1013 Epigastric pain: Secondary | ICD-10-CM | POA: Diagnosis not present

## 2018-02-13 DIAGNOSIS — G8929 Other chronic pain: Secondary | ICD-10-CM | POA: Diagnosis not present

## 2018-02-13 DIAGNOSIS — K219 Gastro-esophageal reflux disease without esophagitis: Secondary | ICD-10-CM

## 2018-02-13 DIAGNOSIS — Z23 Encounter for immunization: Secondary | ICD-10-CM | POA: Diagnosis not present

## 2018-02-13 DIAGNOSIS — K220 Achalasia of cardia: Secondary | ICD-10-CM

## 2018-02-13 MED ORDER — OMEPRAZOLE 20 MG PO CPDR: 40.0000 mg | DELAYED_RELEASE_CAPSULE | Freq: Every day | ORAL | 3 refills | Status: DC

## 2018-02-13 NOTE — Progress Notes (Signed)
CC: Achy epigastric pain   HPI:  [name] is a 72 y.o. with medical history as listed below presenting with achy epigastric pain.  See problem based assessment below for additional details.   Dyspepsia vs GERD vs Achalasia: [name] has a history of GERD and achalasia s/p Botox injection at the lower esophageal sphincter in January 2019.  Today, he is presented with achy epigastric pain which he states is chronic and has been ongoing for the past 3 years but however in the last week it has gotten worse.  Reports that pain is intermittent, associated with cough which is worse at night.  States that the pain usually occurs when he lies in bed to sleep and completely resolves when he sits up.  In the last week, he has been doubling his omeprazole dose and noticed improvement however he has ran out of his medication.  He denies radiating chest pain, shortness of breath, palpitation, dysrhythmia,  nausea, vomiting.  I am inclined to think his presenting symptoms is most likely due to worsening of acid reflux or early recurrence of esophageal dysmotility even though he denies dysphagia, odynophagia and does not have any red flag symptoms.  -Given refill of omeprazole 40 mg daily -Referral to GI has been placed for 9/25 09/2017 at 2 PM with Dr. Pincus Sanes (this is the earliest appointment I could get)   "Patient has poor health literacy and obtaining history was quite challenging"  Past Medical History:  Diagnosis Date  . 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  Excellent symptomatic response to LES Botox injections.   . AVM (arteriovenous malformation) of colon 08/26/2013   Cecum X 3, without bleeding on colonoscopy February 2015   . B12 deficiency 10/01/2011   Discovered to 2 progressive neurologic pain and dysfunction.  Diagnosis established March 2013.  Requires the following treatment: B12 IM monthly  until level normalizes. After that, oral supplementation.   . Cataract   . Chronic venous insufficiency 11/28/2013  . Constipation due to opioid therapy 03/11/2015  . Diverticulosis 08/26/2013  . Essential hypertension 06/15/2006  . Family history of malignant neoplasm of gastrointestinal tract 06/30/2013   Father with colon cancer age 73   . Gastroesophageal reflux disease 02/04/2007  . Internal hemorrhoids 08/26/2013  . Left posterior subcapsular cataract 10/03/2013   s/p yag laser capsulotomy 10/03/2013   . 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  Complicated by chronic low back pain and neuropathy.  Nerve conduction study (10/19/11): Diffusely low motor amplitudes, with primarily involvement of the peroneal and posterior tibial nerves. No evidence of generalized peripheral neuropathy. Findings could be c/w bil multilevel lumbosacral radiculopathies or a primary motor neuropathy.   . Mild aortic valve stenosis 10/09/2006   Echo (12/09/2009): Valve area: 2.03 cm2   . Neuropathy   . Obesity, Class I, BMI 30-34.9 04/05/2012  . Onychomycosis of toenail 06/05/2014  . Osteoarthritis 11/25/2009   Right knee (medial compartment), right hip, left shoulder   . 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 Trigger little finger of right hand 03/11/2015   Review of Systems: As per HPI  Physical Exam:  Vitals:   02/13/18 1443  BP: (!) 144/64  Pulse: 100  SpO2: (!) 71%  Weight: 276  lb 1.6 oz (125.2 kg)   Constitutional: Sitting comfortably in wheelchair and in no acute distress Cardiovascular: Normal S1 and S2.  No murmurs, gallops, rubs Respiratory: Clear to auscultation bilaterally.  No wheezes, crackles, rhonchi Abdomen: Bowel sounds present, epigastrium nontender to palpation, nondistended  abdomen.  Assessment & Plan:   See Encounters Tab for problem based charting.  Patient discussed with Dr. Daryll Drown

## 2018-02-13 NOTE — Patient Instructions (Addendum)
[name],   It was a pleasure taking care of you here at the clinic today and sorry to hear that you are having this achy upper stomach pain.  I think you might be having worsening of your acid reflux or some recurrence of achalasia.  Here are my recommendations after today's visit:  1.  I am giving you a new prescription for omeprazole 40 mg daily 2.  I have made an appointment for you to see the GI doctor on Friday, March 20, 2018 at 2 PM.   ~Dr. Eileen Stanford

## 2018-02-15 ENCOUNTER — Ambulatory Visit: Payer: Medicare Other | Admitting: Gastroenterology

## 2018-02-17 NOTE — Progress Notes (Signed)
Internal Medicine Clinic Attending  I saw and evaluated the patient.  I personally confirmed the key portions of the history and exam documented by Dr. Agyei and I reviewed pertinent patient test results.  The assessment, diagnosis, and plan were formulated together and I agree with the documentation in the resident's note.  

## 2018-03-06 ENCOUNTER — Other Ambulatory Visit: Payer: Self-pay | Admitting: Internal Medicine

## 2018-03-06 DIAGNOSIS — E538 Deficiency of other specified B group vitamins: Secondary | ICD-10-CM

## 2018-03-06 NOTE — Telephone Encounter (Signed)
Next appt scheduled 10/25 with PCP. 

## 2018-03-12 ENCOUNTER — Other Ambulatory Visit: Payer: Self-pay | Admitting: Internal Medicine

## 2018-03-12 DIAGNOSIS — M1711 Unilateral primary osteoarthritis, right knee: Secondary | ICD-10-CM

## 2018-03-12 MED ORDER — OXYCODONE-ACETAMINOPHEN 10-325 MG PO TABS: 1.0000 | ORAL_TABLET | Freq: Three times a day (TID) | ORAL | 0 refills | Status: DC | PRN

## 2018-03-20 ENCOUNTER — Other Ambulatory Visit: Payer: Self-pay

## 2018-03-20 ENCOUNTER — Ambulatory Visit (INDEPENDENT_AMBULATORY_CARE_PROVIDER_SITE_OTHER): Payer: Medicare Other | Admitting: Gastroenterology

## 2018-03-20 ENCOUNTER — Encounter: Payer: Self-pay | Admitting: Gastroenterology

## 2018-03-20 VITALS — BP 110/60 | HR 68 | Ht 71.75 in | Wt 274.4 lb

## 2018-03-20 DIAGNOSIS — K220 Achalasia of cardia: Secondary | ICD-10-CM

## 2018-03-20 DIAGNOSIS — R1310 Dysphagia, unspecified: Secondary | ICD-10-CM

## 2018-03-20 DIAGNOSIS — R1319 Other dysphagia: Secondary | ICD-10-CM

## 2018-03-20 NOTE — Progress Notes (Signed)
[name] Progress Note  Chief Complaint: Achalasia and dysphagia  Subjective  History:  This is a 72 year old man previously cared for by Dr. Deatra Ina made a diagnosis of achalasia, seen by me November 2018 for ongoing symptoms of dysphagia and regurgitation.  He had previously been treated with Botox on multiple occasions, and on 07/17/2017 also had upper endoscopy with injection of 100 units of Botox at the LES.  [name] was sent by the medical clinic after visit there last month where he complained of worsening dysphagia with lower chest/epigastric discomfort after meals.  His PPI was increased and he was referred back to [place].  As before, he is a very limited historian poor health literacy.  He seems to deny weight loss.  He might have some abdominal pain that is difficult to characterize.  When he feels a fullness in his chest after reading it also makes him feel short of breath.  He has peripheral neuropathy symptoms Remainder of systems are negative as near as can be determined.  Limited history ability.   The patient's Past Medical, Family and Social History were reviewed and are on file in the EMR. Past Medical History:  Diagnosis Date  . 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  Excellent symptomatic response to LES Botox injections.   . AVM (arteriovenous malformation) of colon 08/26/2013   Cecum X 3, without bleeding on colonoscopy February 2015   . B12 deficiency 10/01/2011   Discovered to 2 progressive neurologic pain and dysfunction.  Diagnosis established March 2013.  Requires the following treatment: B12 IM monthly until level normalizes. After that, oral supplementation.   . Cataract   . Chronic venous insufficiency 11/28/2013  . Constipation due to opioid therapy 03/11/2015  . Diverticulosis 08/26/2013  . Essential hypertension 06/15/2006  . Family history of malignant neoplasm  of gastrointestinal tract 06/30/2013   Father with colon cancer age 23   . Gastroesophageal reflux disease 02/04/2007  . Internal hemorrhoids 08/26/2013  . Left posterior subcapsular cataract 10/03/2013   s/p yag laser capsulotomy 10/03/2013   . 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  Complicated by chronic low back pain and neuropathy.  Nerve conduction study (10/19/11): Diffusely low motor amplitudes, with primarily involvement of the peroneal and posterior tibial nerves. No evidence of generalized peripheral neuropathy. Findings could be c/w bil multilevel lumbosacral radiculopathies or a primary motor neuropathy.   . Mild aortic valve stenosis 10/09/2006   Echo (12/09/2009): Valve area: 2.03 cm2   . Neuropathy   . Obesity, Class I, BMI 30-34.9 04/05/2012  . Onychomycosis of toenail 06/05/2014  . Osteoarthritis 11/25/2009   Right knee (medial compartment), right hip, left shoulder   . 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 Trigger little finger of right hand 03/11/2015    Objective:  Med list reviewed  Current Outpatient Medications:  .  aspirin 81 MG EC tablet, Take 81 mg by mouth daily.  , Disp: , Rfl:  .  atropine 1 % ophthalmic solution, Place 1 drop into both eyes daily. , Disp: , Rfl: 1 .  bimatoprost (LUMIGAN) 0.01 % SOLN, Place 1 drop into both eyes at bedtime., Disp: , Rfl:  .  COMBIGAN 0.2-0.5 % ophthalmic solution, Place 1 drop into  both eyes 2 (two) times daily., Disp: , Rfl:  .  gabapentin (NEURONTIN) 300 MG capsule, Take 2 capsules (600 mg total) by mouth 3 (three) times daily., Disp: 540 capsule, Rfl: 3 .  hydrochlorothiazide (HYDRODIURIL) 25 MG tablet, Take 1 tablet (25 mg total) by mouth daily., Disp: 90 tablet, Rfl: 3 .  hydrocortisone cream 1 %, Apply to itchy area 2  times daily, Disp: 45 g, Rfl: 1 .  ibuprofen (IBU) 800 MG tablet, Take 1 tablet (800 mg total) by mouth every 8 (eight) hours as needed for moderate pain., Disp: 90 tablet, Rfl: 11 .  omeprazole (PRILOSEC) 20 MG capsule, Take 20 mg by mouth daily., Disp: , Rfl:  .  [START ON 05/11/2018] oxyCODONE-acetaminophen (PERCOCET) 10-325 MG tablet, Take 1 tablet by mouth every 8 (eight) hours as needed for pain., Disp: 75 tablet, Rfl: 0 .  sorbitol 70 % solution, Take 15 mLs by mouth daily as needed. (Patient taking differently: Take 15 mLs by mouth daily as needed (for constipation). ), Disp: 3840 mL, Rfl: 3 .  verapamil (CALAN-SR) 180 MG CR tablet, Take 1 tablet (180 mg total) by mouth daily., Disp: 90 tablet, Rfl: 3 .  vitamin B-12 (CYANOCOBALAMIN) 1000 MCG tablet, Take 1 tablet (1,000 mcg total) by mouth daily., Disp: 90 tablet, Rfl: 3   Vital signs in last 24 hrs: Vitals:   03/20/18 1413  BP: 110/60  Pulse: 68    Physical Exam  Chronically ill-appearing man, seated in a rolling walker, able to stand and get on exam table with some assistance.  HEENT: sclera anicteric, oral mucosa moist without lesions  Neck: supple, no thyromegaly, JVD or lymphadenopathy  Cardiac: RRR with 4/6 systolic murmur, mild peripheral edema  Pulm: clear to auscultation bilaterally, normal RR and effort noted  Abdomen: Obese, soft, no tenderness, with active bowel sounds. No guarding or palpable hepatosplenomegaly, limited by body habitus  Skin; warm and dry, no jaundice or rash  Data: Moderate aortic stenosis seen on last echocardiogram December 2017.  Normal LV function.  No recent CBC or CMP for review   @ASSESSMENTPLANBEGIN @ Assessment: Encounter Diagnoses  Name Primary?  . Esophageal dysphagia Yes  . Achalasia    He seems to have recurrent symptoms of achalasia.  Given his age and comorbidities, I still feel that EGD with Botox injection of the LES is appropriate therapy for him.  He recalled  having had it done before, though he did not recall me in particular.  I explained the procedure to him again including risks and benefits and he is agreeable.  The benefits and risks of the planned procedure were described in detail with the patient or (when appropriate) their health care proxy.  Risks were outlined as including, but not limited to, bleeding, infection, perforation, adverse medication reaction leading to cardiac or pulmonary decompensation, or pancreatitis (if ERCP).  The limitation of incomplete mucosal visualization was also discussed.  No guarantees or warranties were given. It needs to be done in the hospital outpatient endoscopy department due to the nature of the procedure and his medical complexity. Patient at increased risk for cardiopulmonary complications of procedure due to medical comorbidities.   Total time 25 minutes, over half spent face-to-face with patient in counseling and coordination of care.   [name]

## 2018-03-20 NOTE — Patient Instructions (Signed)
If you are age 74 or older, your body mass index should be between 23-30. Your Body mass index is 37.47 kg/m. If this is out of the aforementioned range listed, please consider follow up with your Primary Care Provider.  If you are age 78 or younger, your body mass index should be between 19-25. Your Body mass index is 37.47 kg/m. If this is out of the aformentioned range listed, please consider follow up with your Primary Care Provider.   You have been scheduled for an endoscopy. Please follow written instructions given to you at your visit today. If you use inhalers (even only as needed), please bring them with you on the day of your procedure.  It was a pleasure to see you today!  Dr. Loletha Carrow

## 2018-04-01 ENCOUNTER — Telehealth: Payer: Self-pay | Admitting: Gastroenterology

## 2018-04-01 ENCOUNTER — Other Ambulatory Visit: Payer: Self-pay

## 2018-04-01 NOTE — Telephone Encounter (Signed)
Routed to Toni. 

## 2018-04-02 ENCOUNTER — Encounter: Payer: Self-pay | Admitting: Podiatry

## 2018-04-02 ENCOUNTER — Ambulatory Visit (INDEPENDENT_AMBULATORY_CARE_PROVIDER_SITE_OTHER): Payer: Medicare Other | Admitting: Podiatry

## 2018-04-02 DIAGNOSIS — M79675 Pain in left toe(s): Secondary | ICD-10-CM

## 2018-04-02 DIAGNOSIS — B351 Tinea unguium: Secondary | ICD-10-CM

## 2018-04-02 DIAGNOSIS — M79674 Pain in right toe(s): Secondary | ICD-10-CM

## 2018-04-02 DIAGNOSIS — Q828 Other specified congenital malformations of skin: Secondary | ICD-10-CM

## 2018-04-02 NOTE — Telephone Encounter (Signed)
Pt has been notified that the scope has been moved to [facility] endo. He states clear understanding.

## 2018-04-02 NOTE — Progress Notes (Signed)
This patient presents to the office with chief complaint of painful long thick nails.  He also says he has multiple painful calluses on the bottom of both feet.  He says his nails and his calluses are painful, causing him to be in pain when he walks.  He presents to the office today for preventative foot care services.   General Appearance  Alert, conversant and in no acute stress.  Vascular  Dorsalis pedis and posterior pulses are weakly  palpable  bilaterally.  Capillary return is within normal limits  bilaterally. Temperature is within normal limits  Bilaterally.  Neurologic  Senn-Weinstein monofilament wire test within normal limits  bilaterally. Muscle power within normal limits bilaterally.  Nails Thick disfigured discolored nails with subungual debris bilaterally from hallux to fifth toes bilaterally. No evidence of bacterial infection or drainage bilaterally.Pes planus foot type.  Orthopedic  No limitations of motion of motion feet bilaterally.  No crepitus or effusions noted.  No bony pathology or digital deformities noted.  Skin  normotropic skin.   Multiple porokeratosis lesions  B/L. Especially sub 5 left and sub 1 right.  No signs of infections or ulcers noted.     Onychomycosis  B/L.  Porokeratosis  Forefeet  B/L.  Corn fifth toe right foot   Debridement of nails  B/L.  Debridement of multiple porokeratosis  B/L   RTC 10 weeks.   Gardiner Barefoot DPM

## 2018-04-11 ENCOUNTER — Other Ambulatory Visit: Payer: Self-pay

## 2018-04-15 ENCOUNTER — Ambulatory Visit (HOSPITAL_COMMUNITY): Payer: Medicare Other | Admitting: Anesthesiology

## 2018-04-15 ENCOUNTER — Encounter (HOSPITAL_COMMUNITY)
Admission: RE | Disposition: A | Discharge status: Home / Self Care | Payer: Self-pay | Source: Ambulatory Visit | Attending: Gastroenterology

## 2018-04-15 ENCOUNTER — Encounter (HOSPITAL_COMMUNITY): Payer: Self-pay | Admitting: *Deleted

## 2018-04-15 ENCOUNTER — Ambulatory Visit (HOSPITAL_COMMUNITY)
Admission: RE | Admit: 2018-04-15 | Discharge: 2018-04-15 | Disposition: A | Discharge status: Home / Self Care | Payer: Medicare Other | Source: Ambulatory Visit | Attending: Gastroenterology | Admitting: Gastroenterology

## 2018-04-15 ENCOUNTER — Other Ambulatory Visit: Payer: Self-pay

## 2018-04-15 DIAGNOSIS — K228 Other specified diseases of esophagus: Secondary | ICD-10-CM | POA: Diagnosis not present

## 2018-04-15 DIAGNOSIS — K219 Gastro-esophageal reflux disease without esophagitis: Secondary | ICD-10-CM | POA: Diagnosis not present

## 2018-04-15 DIAGNOSIS — I10 Essential (primary) hypertension: Secondary | ICD-10-CM | POA: Diagnosis not present

## 2018-04-15 DIAGNOSIS — Z6834 Body mass index (BMI) 34.0-34.9, adult: Secondary | ICD-10-CM | POA: Insufficient documentation

## 2018-04-15 DIAGNOSIS — R1310 Dysphagia, unspecified: Secondary | ICD-10-CM | POA: Insufficient documentation

## 2018-04-15 DIAGNOSIS — Z87891 Personal history of nicotine dependence: Secondary | ICD-10-CM | POA: Insufficient documentation

## 2018-04-15 DIAGNOSIS — Z800 Family history of malignant neoplasm of digestive organs: Secondary | ICD-10-CM | POA: Diagnosis not present

## 2018-04-15 DIAGNOSIS — E669 Obesity, unspecified: Secondary | ICD-10-CM | POA: Insufficient documentation

## 2018-04-15 DIAGNOSIS — K220 Achalasia of cardia: Secondary | ICD-10-CM | POA: Diagnosis not present

## 2018-04-15 HISTORY — PX: BOTOX INJECTION: SHX5754

## 2018-04-15 HISTORY — PX: ESOPHAGOGASTRODUODENOSCOPY (EGD) WITH PROPOFOL: SHX5813

## 2018-04-15 SURGERY — ESOPHAGOGASTRODUODENOSCOPY (EGD) WITH PROPOFOL
Anesthesia: Monitor Anesthesia Care

## 2018-04-15 MED ORDER — ONABOTULINUMTOXINA 100 UNITS IJ SOLR
INTRAMUSCULAR | Status: AC
  Filled 2018-04-15: qty 100

## 2018-04-15 MED ORDER — LIDOCAINE 2% (20 MG/ML) 5 ML SYRINGE
INTRAMUSCULAR | Status: DC | PRN
  Administered 2018-04-15: 100 mg via INTRAVENOUS

## 2018-04-15 MED ORDER — SODIUM CHLORIDE 0.9 % IJ SOLN
INTRAMUSCULAR | Status: DC | PRN
  Administered 2018-04-15: 4 mL via SUBMUCOSAL

## 2018-04-15 MED ORDER — LACTATED RINGERS IV SOLN
INTRAVENOUS | Status: DC
  Administered 2018-04-15: 10:00:00 via INTRAVENOUS

## 2018-04-15 MED ORDER — PROPOFOL 500 MG/50ML IV EMUL
INTRAVENOUS | Status: DC | PRN
  Administered 2018-04-15: 100 ug/kg/min via INTRAVENOUS

## 2018-04-15 MED ORDER — SODIUM CHLORIDE 0.9 % IV SOLN: INTRAVENOUS | Status: DC

## 2018-04-15 MED ORDER — SODIUM CHLORIDE 0.9 % IJ SOLN
INTRAMUSCULAR | Status: AC
  Filled 2018-04-15: qty 10

## 2018-04-15 MED ORDER — PROPOFOL 10 MG/ML IV BOLUS
INTRAVENOUS | Status: DC | PRN
  Administered 2018-04-15 (×2): 50 mg via INTRAVENOUS

## 2018-04-15 SURGICAL SUPPLY — 15 items

## 2018-04-15 NOTE — Interval H&P Note (Signed)
History and Physical Interval Note:  04/15/2018 10:22 AM  [name]  has presented today for surgery, with the diagnosis of achalashia  The various methods of treatment have been discussed with the patient and family. After consideration of risks, benefits and other options for treatment, the patient has consented to  Procedure(s): ESOPHAGOGASTRODUODENOSCOPY (EGD) WITH PROPOFOL (N/A) BOTOX INJECTION (N/A) as a surgical intervention .  The patient's history has been reviewed, patient examined, no change in status, stable for surgery.  I have reviewed the patient's chart and labs.  Questions were answered to the patient's satisfaction.     [name]

## 2018-04-15 NOTE — Anesthesia Preprocedure Evaluation (Signed)
Anesthesia Evaluation  Patient identified by MRN, date of birth, ID band Patient awake    Reviewed: Allergy & Precautions, H&P , NPO status , Patient's Chart, lab work & pertinent test results  Airway Mallampati: III  TM Distance: >3 FB Neck ROM: Full    Dental  (+) Dental Advisory Given, Upper Dentures   Pulmonary former smoker,    Pulmonary exam normal breath sounds clear to auscultation       Cardiovascular hypertension, Pt. on medications Normal cardiovascular exam+ Valvular Problems/Murmurs AS  Rhythm:Regular Rate:Normal + Systolic murmurs EKG - Septal Q waves, mildly prolonged QTc  TTE 2017 - Mild LVH. EF was in the range of 60% to 65%.   Grade 1 diastolic dysfunction. Moderate AS, trivial AI. Trivial MR, moderately dilated LA.   Neuro/Psych negative neurological ROS  negative psych ROS   GI/Hepatic Neg liver ROS, GERD  Medicated and Controlled,achalasia   Endo/Other  Obesity  Renal/GU negative Renal ROS  negative genitourinary   Musculoskeletal  (+) Arthritis , Osteoarthritis,    Abdominal (+) + obese,   Peds negative pediatric ROS (+)  Hematology negative hematology ROS (+)   Anesthesia Other Findings   Reproductive/Obstetrics negative OB ROS                             Anesthesia Physical  Anesthesia Plan  ASA: III  Anesthesia Plan: MAC   Post-op Pain Management:    Induction: Intravenous  PONV Risk Score and Plan: 1 and Propofol infusion, Treatment may vary due to age or medical condition and Ondansetron  Airway Management Planned: Nasal Cannula  Additional Equipment: None  Intra-op Plan:   Post-operative Plan:   Informed Consent: I have reviewed the patients History and Physical, chart, labs and discussed the procedure including the risks, benefits and alternatives for the proposed anesthesia with the patient or authorized representative who has indicated  his/her understanding and acceptance.     Plan Discussed with: CRNA  Anesthesia Plan Comments:         Anesthesia Quick Evaluation

## 2018-04-15 NOTE — Anesthesia Postprocedure Evaluation (Signed)
Anesthesia Post Note  Patient: [name]  Procedure(s) Performed: ESOPHAGOGASTRODUODENOSCOPY (EGD) WITH PROPOFOL (N/A ) BOTOX INJECTION (N/A )     Patient location during evaluation: PACU Anesthesia Type: MAC Level of consciousness: awake and alert Pain management: pain level controlled Vital Signs Assessment: post-procedure vital signs reviewed and stable Respiratory status: spontaneous breathing, nonlabored ventilation, respiratory function stable and patient connected to nasal cannula oxygen Cardiovascular status: stable and blood pressure returned to baseline Postop Assessment: no apparent nausea or vomiting Anesthetic complications: no    Last Vitals:  Vitals:   04/15/18 1100 04/15/18 1115  BP: (!) 106/57 (!) 123/55  Pulse: 69 66  Resp: 14 20  Temp: 36.6 C   SpO2: 100% 98%    Last Pain:  Vitals:   04/15/18 1115  TempSrc:   PainSc: 0-No pain                 Effie Berkshire

## 2018-04-15 NOTE — Transfer of Care (Signed)
Immediate Anesthesia Transfer of Care Note  Patient: [name]  Procedure(s) Performed: ESOPHAGOGASTRODUODENOSCOPY (EGD) WITH PROPOFOL (N/A ) BOTOX INJECTION (N/A )  Patient Location: PACU and Endoscopy Unit  Anesthesia Type:MAC  Level of Consciousness: awake and drowsy  Airway & Oxygen Therapy: Patient Spontanous Breathing and Patient connected to nasal cannula oxygen  Post-op Assessment: Report given to RN and Post -op Vital signs reviewed and stable  Post vital signs: Reviewed and stable  Last Vitals:  Vitals Value Taken Time  BP 109/52 04/15/2018 10:56 AM  Temp    Pulse 69 04/15/2018 11:00 AM  Resp 14 04/15/2018 10:59 AM  SpO2 100 % 04/15/2018 11:00 AM  Vitals shown include unvalidated device data.  Last Pain:  Vitals:   04/15/18 0937  TempSrc: Oral  PainSc: 0-No pain         Complications: No apparent anesthesia complications

## 2018-04-15 NOTE — Discharge Instructions (Signed)
YOU HAD AN ENDOSCOPIC PROCEDURE TODAY: Refer to the procedure report and other information in the discharge instructions given to you for any specific questions about what was found during the examination. If this information does not answer your questions, please call [facility] office at [phone] to clarify.   YOU SHOULD EXPECT: Some feelings of bloating in the abdomen. Passage of more gas than usual. Walking can help get rid of the air that was put into your GI tract during the procedure and reduce the bloating. If you had a lower endoscopy (such as a colonoscopy or flexible sigmoidoscopy) you may notice spotting of blood in your stool or on the toilet paper. Some abdominal soreness may be present for a day or two, also.  DIET: Your first meal following the procedure should be a light meal and then it is ok to progress to your normal diet. A half-sandwich or bowl of soup is an example of a good first meal. Heavy or fried foods are harder to digest and may make you feel nauseous or bloated. Drink plenty of fluids but you should avoid alcoholic beverages for 24 hours. If you had a esophageal dilation, please see attached instructions for diet.    ACTIVITY: Your care partner should take you home directly after the procedure. You should plan to take it easy, moving slowly for the rest of the day. You can resume normal activity the day after the procedure however YOU SHOULD NOT DRIVE, use power tools, machinery or perform tasks that involve climbing or major physical exertion for 24 hours (because of the sedation medicines used during the test).   SYMPTOMS TO REPORT IMMEDIATELY: A gastroenterologist can be reached at any hour. Please call [phone]  for any of the following symptoms:   Following upper endoscopy (EGD, EUS, ERCP, esophageal dilation) Vomiting of blood or coffee ground material  New, significant abdominal pain  New, significant chest pain or pain under the shoulder blades  Painful or  persistently difficult swallowing  New shortness of breath  Black, tarry-looking or red, bloody stools  FOLLOW UP:  If any biopsies were taken you will be contacted by phone or by letter within the next 1-3 weeks. Call [phone]  if you have not heard about the biopsies in 3 weeks.  Please also call with any specific questions about appointments or follow up tests.  

## 2018-04-15 NOTE — H&P (Signed)
History:  This patient presents for endoscopic testing for dysphagia - achalasia. See 03/20/18 office note for details  [name] Referring physician: [name], MD  Past Medical History: Past Medical History:  Diagnosis Date  . 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  Excellent symptomatic response to LES Botox injections.   . AVM (arteriovenous malformation) of colon 08/26/2013   Cecum X 3, without bleeding on colonoscopy February 2015   . B12 deficiency 10/01/2011   Discovered to 2 progressive neurologic pain and dysfunction.  Diagnosis established March 2013.  Requires the following treatment: B12 IM monthly until level normalizes. After that, oral supplementation.   . Cataract   . Chronic venous insufficiency 11/28/2013  . Constipation due to opioid therapy 03/11/2015  . Diverticulosis 08/26/2013  . Essential hypertension 06/15/2006  . Family history of malignant neoplasm of gastrointestinal tract 06/30/2013   Father with colon cancer age 75   . Gastroesophageal reflux disease 02/04/2007  . Internal hemorrhoids 08/26/2013  . Left posterior subcapsular cataract 10/03/2013   s/p yag laser capsulotomy 10/03/2013   . 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  Complicated by chronic low back pain and neuropathy.  Nerve conduction study (10/19/11): Diffusely low motor amplitudes, with primarily involvement of the peroneal and posterior tibial nerves. No evidence of generalized peripheral neuropathy. Findings could be c/w bil multilevel lumbosacral radiculopathies or a primary motor neuropathy.   . Mild aortic valve stenosis 10/09/2006   Echo (12/09/2009): Valve area: 2.03 cm2   . Neuropathy   . Obesity, Class I, BMI 30-34.9 04/05/2012  . Onychomycosis of  toenail 06/05/2014  . Osteoarthritis 11/25/2009   Right knee (medial compartment), right hip, left shoulder   . 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 Trigger little finger of right hand 03/11/2015     Past Surgical History: Past Surgical History:  Procedure Laterality Date  . BOTOX INJECTION N/A 09/24/2013   Procedure: BOTOX INJECTION;  Surgeon: Inda Castle, MD;  Location: [facility];  Service: Endoscopy;  Laterality: N/A;  . BOTOX INJECTION N/A 07/17/2017   Procedure: BOTOX INJECTION;  Surgeon: Doran Stabler, MD;  Location: [facility];  Service: Gastroenterology;  Laterality: N/A;  . CAPSULOTOMY Left 10/03/2013   Procedure: YAG CAPSULOTOMY OF LEFT EYE;  Surgeon: Myrtha Mantis., MD;  Location: [place];  Service: Ophthalmology;  Laterality: Left;  . CATARACT EXTRACTION  05/01/2012   left eye  . CATARACT EXTRACTION W/PHACO  11/08/2011   Procedure: CATARACT EXTRACTION PHACO AND INTRAOCULAR LENS PLACEMENT (IOC);  Surgeon: Adonis Brook, MD;  Location: [place];  Service: Ophthalmology;  Laterality: Right;  . CATARACT EXTRACTION W/PHACO  05/01/2012   Procedure: CATARACT EXTRACTION PHACO AND INTRAOCULAR LENS PLACEMENT (IOC);  Surgeon: Adonis Brook, MD;  Location: [place];  Service: Ophthalmology;  Laterality: Left;  . colonscopy    . ESOPHAGEAL MANOMETRY N/A 08/25/2013   Procedure: ESOPHAGEAL MANOMETRY (EM);  Surgeon: Inda Castle, MD;  [facility]: [facility];  Service: Endoscopy;  Laterality: N/A;  . ESOPHAGOGASTRODUODENOSCOPY N/A 09/24/2013   Procedure: ESOPHAGOGASTRODUODENOSCOPY (EGD);  Surgeon: Inda Castle, MD;  Location: [facility];  Service: Endoscopy;  Laterality: N/A;  . ESOPHAGOGASTRODUODENOSCOPY N/A 02/17/2014   Procedure: ESOPHAGOGASTRODUODENOSCOPY (EGD);  Surgeon: Inda Castle, MD;  [facility]: [facility];  Service: Endoscopy;  Laterality: N/A;  . ESOPHAGOGASTRODUODENOSCOPY (EGD) WITH PROPOFOL N/A 07/17/2017    Procedure: ESOPHAGOGASTRODUODENOSCOPY (EGD) WITH PROPOFOL;  Surgeon: Doran Stabler, MD;  Location: [facility];  Service: Gastroenterology;  Laterality: N/A;  with BOTOX  . EYE SURGERY     cat ext right  . FRACTURE SURGERY     left shoulder  . GAS/FLUID EXCHANGE Right 07/02/2013   Procedure: GAS/FLUID EXCHANGE;  Surgeon: Adonis Brook, MD;  Location: [place];  Service: Ophthalmology;  Laterality: Right;  . PARS PLANA VITRECTOMY Right 07/02/2013   Procedure: PARS PLANA VITRECTOMY WITH 23 GAUGE WITH MEMBRANE PEEL AND ENDOLASER AND SILICONE OIL;  Surgeon: Adonis Brook, MD;  Location: [place];  Service: Ophthalmology;  Laterality: Right;  . SCLERAL BUCKLE Right 03/15/2013   Procedure: SCLERAL BUCKLE with 23Ga Vit;  Surgeon: Adonis Brook, MD;  Location: [place];  Service: Ophthalmology;  Laterality: Right;    Allergies: Allergies  Allergen Reactions  . Amlodipine Swelling and Other (See Comments)    Bilateral lower extremity swelling    Outpatient Meds: Current Facility-Administered Medications  Medication Dose Route Frequency Provider Last Rate Last Dose  . lactated ringers infusion   Intravenous Continuous Danis, Estill Cotta III, MD      . lactated ringers infusion   Intravenous Continuous [name], MD 125 mL/hr at 04/15/18 1002        ___________________________________________________________________ Objective   Exam:  BP (!) 156/73   Pulse 72   Temp 98 F (36.7 C) (Oral)   Resp 20   Ht 6\' 2"  (1.88 m)   Wt 122.5 kg   SpO2 97%   BMI 34.67 kg/m    CV: RRR without murmur, S1/S2, no JVD, no peripheral edema  Resp: clear to auscultation bilaterally, normal RR and effort noted  GI: soft, no tenderness, with active bowel sounds. No guarding or palpable organomegaly noted.  Neuro: awake, alert and oriented x 3.   Assessment:  Dysphagia - achalasia  Plan:  EGD with Botox injection of LES   [name]

## 2018-04-15 NOTE — Op Note (Signed)
[facility] Patient Name: [name] Procedure Date: 04/15/2018 MRN: [id number] Attending MD: Estill Cotta. Loletha Carrow , MD Date of Birth: 1946/04/07 CSN: [id number] Age: 72 Admit Type: Outpatient Procedure:                Upper GI endoscopy Indications:              Dysphagia, For botulinum toxin injection of                            achalasia Providers:                Mallie Mussel L. Loletha Carrow, MD, Elmer Ramp. Tilden Dome, RN, Charolette Child, Technician, Stephanie [place] CRNA Referring MD:              Medicines:                Monitored Anesthesia Care Complications:            No immediate complications. Estimated Blood Loss:     Estimated blood loss: none. Procedure:                Pre-Anesthesia Assessment:                           - Prior to the procedure, a History and Physical                            was performed, and patient medications and                            allergies were reviewed. The patient's tolerance of                            previous anesthesia was also reviewed. The risks                            and benefits of the procedure and the sedation                            options and risks were discussed with the patient.                            All questions were answered, and informed consent                            was obtained. Anticoagulants: The patient has taken                            aspirin. It was decided not to withhold this                            medication prior to the procedure. ASA Grade  Assessment: III - A patient with severe systemic                            disease. After reviewing the risks and benefits,                            the patient was deemed in satisfactory condition to                            undergo the procedure.                           After obtaining informed consent, the endoscope was                            passed under direct vision.  Throughout the                            procedure, the patient's blood pressure, pulse, and                            oxygen saturations were monitored continuously. The                            [id number]-H190 ([id number] Olympus adult endoscope was                            introduced through the mouth, and advanced to the                            second part of duodenum. The upper GI endoscopy was                            accomplished without difficulty. The patient                            tolerated the procedure well. Scope In: Scope Out: Findings:      The larynx was normal.      The lumen of the middle third of the esophagus and lower third of the       esophagus was moderately dilated.      Abnormal motility was noted in the esophagus. The cricopharyngeus was       abnormal. There are extra peristaltic waves in the esophageal body. The       distal esophagus/lower esophageal sphincter is spastic, but gives up       passage to the endoscope. Area was successfully injected with 100 units       botulinum toxin.      The exam of the esophagus was otherwise normal.      The stomach was normal.      The cardia and gastric fundus were normal on retroflexion.      The examined duodenum was normal. Impression:               - Normal larynx.                           -  Dilation in the middle third of the esophagus and                            in the lower third of the esophagus.                           - Abnormal esophageal motility, established                            achalasia. Injected with botulinum toxin.                           - Normal stomach.                           - Normal examined duodenum.                           - No specimens collected. Moderate Sedation:      N/A- Per Anesthesia Care Recommendation:           - Patient has a contact number available for                            emergencies. The signs and symptoms of potential                             delayed complications were discussed with the                            patient. Return to normal activities tomorrow.                            Written discharge instructions were provided to the                            patient.                           - Resume previous diet.                           - Continue present medications.                           - Repeat upper endoscopy in 6 months to evaluate                            the response to therapy and repeat BoTox injection                            as needed. Procedure Code(s):        --- Professional ---                           [phone], Esophagogastroduodenoscopy, flexible,  transoral; with directed submucosal injection(s),                            any substance Diagnosis Code(s):        --- Professional ---                           K22.0, Achalasia of cardia                           R13.10, Dysphagia, unspecified CPT copyright 2018 American Medical Association. All rights reserved. The codes documented in this report are preliminary and upon coder review may  be revised to meet current compliance requirements. [name], MD 04/15/2018 10:59:22 AM This report has been signed electronically. Number of Addenda: 0

## 2018-04-17 ENCOUNTER — Encounter (HOSPITAL_COMMUNITY): Payer: Self-pay | Admitting: Gastroenterology

## 2018-04-19 ENCOUNTER — Encounter: Payer: Self-pay | Admitting: Internal Medicine

## 2018-04-19 ENCOUNTER — Ambulatory Visit (INDEPENDENT_AMBULATORY_CARE_PROVIDER_SITE_OTHER): Payer: Medicare Other | Admitting: Internal Medicine

## 2018-04-19 ENCOUNTER — Other Ambulatory Visit: Payer: Self-pay

## 2018-04-19 VITALS — BP 132/75 | HR 70 | Temp 99.0°F | Ht 74.0 in | Wt 270.1 lb

## 2018-04-19 DIAGNOSIS — R011 Cardiac murmur, unspecified: Secondary | ICD-10-CM

## 2018-04-19 DIAGNOSIS — E669 Obesity, unspecified: Secondary | ICD-10-CM

## 2018-04-19 DIAGNOSIS — Z0000 Encounter for general adult medical examination without abnormal findings: Secondary | ICD-10-CM

## 2018-04-19 DIAGNOSIS — K220 Achalasia of cardia: Secondary | ICD-10-CM

## 2018-04-19 DIAGNOSIS — Z79899 Other long term (current) drug therapy: Secondary | ICD-10-CM

## 2018-04-19 DIAGNOSIS — I10 Essential (primary) hypertension: Secondary | ICD-10-CM

## 2018-04-19 DIAGNOSIS — T402X5A Adverse effect of other opioids, initial encounter: Secondary | ICD-10-CM

## 2018-04-19 DIAGNOSIS — T402X5S Adverse effect of other opioids, sequela: Secondary | ICD-10-CM

## 2018-04-19 DIAGNOSIS — L308 Other specified dermatitis: Secondary | ICD-10-CM | POA: Diagnosis not present

## 2018-04-19 DIAGNOSIS — K5903 Drug induced constipation: Secondary | ICD-10-CM

## 2018-04-19 DIAGNOSIS — Z791 Long term (current) use of non-steroidal anti-inflammatories (NSAID): Secondary | ICD-10-CM

## 2018-04-19 DIAGNOSIS — M48062 Spinal stenosis, lumbar region with neurogenic claudication: Secondary | ICD-10-CM

## 2018-04-19 DIAGNOSIS — M1711 Unilateral primary osteoarthritis, right knee: Secondary | ICD-10-CM | POA: Diagnosis not present

## 2018-04-19 DIAGNOSIS — E66812 Obesity, class 2: Secondary | ICD-10-CM

## 2018-04-19 DIAGNOSIS — Z6834 Body mass index (BMI) 34.0-34.9, adult: Secondary | ICD-10-CM

## 2018-04-19 DIAGNOSIS — I350 Nonrheumatic aortic (valve) stenosis: Secondary | ICD-10-CM

## 2018-04-19 DIAGNOSIS — I872 Venous insufficiency (chronic) (peripheral): Secondary | ICD-10-CM

## 2018-04-19 DIAGNOSIS — Z79891 Long term (current) use of opiate analgesic: Secondary | ICD-10-CM

## 2018-04-19 MED ORDER — GABAPENTIN 300 MG PO CAPS: 600.0000 mg | ORAL_CAPSULE | Freq: Three times a day (TID) | ORAL | 3 refills | Status: DC

## 2018-04-19 NOTE — Assessment & Plan Note (Signed)
Assessment  He has apparently been out of his gabapentin for the last 5 days and noted a slight worsening in his neuropathic pain down the right leg.  It is unclear why he has not received his gabapentin as he should have appropriate refills to get him through this time..  Plan  I rewrote the prescription for gabapentin to make sure that his mail order pharmacy has an active prescription on file so that they send it immediately.  We will continue the gabapentin 600 mg by mouth every 8 hours as it is effective it managing his pain per the patient.  We will reassess the efficacy of this therapy at the follow-up visit.

## 2018-04-19 NOTE — Assessment & Plan Note (Signed)
Assessment  His constipation due to opioid therapy has been well controlled with as needed sorbitol.  He is quite pleased with this medication.  Plan  We will continue the as needed sorbitol to manage his constipation due to opioid therapy.  We will reassess the continuing efficacy of this therapy at the follow-up visit.

## 2018-04-19 NOTE — Assessment & Plan Note (Signed)
Assessment  His blood pressure today was at target at 132/75.  This is on verapamil 180 mg by mouth daily and hydrochlorothiazide 25 mg by mouth daily.  He is tolerating this regimen well.  Plan  We will continue the verapamil at 180 mg by mouth daily and hydrochlorothiazide 25 mg by mouth daily.  We will reassess the efficacy of this regimen at the follow-up visit.

## 2018-04-19 NOTE — Assessment & Plan Note (Signed)
He is currently up-to-date on his health care maintenance. 

## 2018-04-19 NOTE — Assessment & Plan Note (Signed)
Assessment  He has lost 6 pounds since the last clinic visit.  He was surprised to hear this but admits to eating mainly soups and soft foods just prior to his Botox treatment for his achalasia and this may have contributed to the weight loss.  Plan  Despite resolution of his symptoms of achalasia after the Botox injection he was encouraged to continue to watch his portion size in hopes of stimulating further weight loss.

## 2018-04-19 NOTE — Assessment & Plan Note (Signed)
Assessment  His right knee pain is reasonably well controlled on the ibuprofen and Percocet which he takes as needed, mainly at night.  Plan  We will continue the ibuprofen at 800 mg by mouth every 8 hours as needed for pain and Percocet 10-325 mg 1 tablet every 8 hours as needed for pain dispense number 75/month.  We will reassess the efficacy of this regimen at the follow-up visit.

## 2018-04-19 NOTE — Patient Instructions (Signed)
It was great to see you.  This is the best you have ever looked and I am glad you are feeling good as well.  1) Keep taking the medications as you are.  2) I sent a prescription into your mail pharmacy for the gabapentin.  If it is not in your mailbox today it should be coming.  I will see you back in 6 months, sooner if necessary.

## 2018-04-19 NOTE — Assessment & Plan Note (Signed)
Assessment  With regards to his moderate calcific aortic stenosis he denies any chest pain, shortness of breath, or syncope.  Plan  We will continue with cardiovascular risk factor modification.  He is due for an echocardiogram in early 2020 to assess the status of his aortic stenosis.

## 2018-04-19 NOTE — Progress Notes (Signed)
   Subjective:    Patient ID: [name], male    DOB: 1945-07-16, 72 y.o.   MRN: [id number]  HPI  [name] is here for follow-up of his essential hypertension, moderate calcific aortic stenosis, osteoarthritis of the right knee, lumbar stenosis with neurogenic claudication, asteatotic eczema, obesity, achalasia, chronic venous insufficiency, and constipation due to opioid therapy. Please see the A&P for the status of the pt's chronic medical problems.  Review of Systems  Constitutional: Negative for activity change, appetite change, fever and unexpected weight change.  Respiratory: Negative for cough, chest tightness, shortness of breath and wheezing.   Cardiovascular: Positive for leg swelling. Negative for chest pain and palpitations.  Gastrointestinal: Positive for constipation. Negative for abdominal distention, abdominal pain, diarrhea, nausea and vomiting.  Genitourinary: Negative for difficulty urinating.  Musculoskeletal: Positive for arthralgias, back pain and gait problem. Negative for joint swelling and myalgias.  Skin: Positive for rash.  Neurological: Negative for syncope.      Objective:   Physical Exam  Constitutional: He is oriented to person, place, and time. He appears well-developed and well-nourished. No distress.  HENT:  Head: Normocephalic and atraumatic.  Eyes: Conjunctivae are normal. Right eye exhibits no discharge. Left eye exhibits no discharge. No scleral icterus.  Cardiovascular: Normal rate and regular rhythm. Exam reveals no gallop and no friction rub.  Murmur heard. Pulmonary/Chest: Effort normal and breath sounds normal. No stridor. No respiratory distress. He has no wheezes. He has no rales.  Musculoskeletal: Normal range of motion. He exhibits edema. He exhibits no tenderness.  Neurological: He is alert and oriented to person, place, and time. He exhibits normal muscle tone.  Skin: Skin is warm and dry. No rash noted. He is not  diaphoretic. No erythema.  Psychiatric: He has a normal mood and affect. His behavior is normal. Judgment and thought content normal.  Nursing note and vitals reviewed.     Assessment & Plan:   Please see problem oriented charting.

## 2018-04-19 NOTE — Assessment & Plan Note (Signed)
Assessment  He has been using the Eucerin cream on a more regular basis and has noticed marked improvement in his left shin asteatotic eczema.  When it does itch on occasion the hydrocortisone 1% cream has been effective in abating the symptoms.  Plan  Now that we are nearing the winter season he was asked to be consistent in his use of the Eucerin cream to avoid an exacerbation of his asteototic eczema.  He has access to hydrocortisone 1% cream for itching as needed.

## 2018-04-19 NOTE — Assessment & Plan Note (Signed)
Assessment  His symptoms have abated after his recent Botox injection endoscopically by GI.  He is now swallowing without difficulty and has no pain associated with his achalasia at this time.  Plan  We will continue to monitor for signs and symptoms consistent with recurrence of his achalasia.  We appreciate GI's assistance in managing this disease for him.

## 2018-04-19 NOTE — Assessment & Plan Note (Signed)
Assessment  He continues to be unable to apply the compression stockings each morning without assistance.  He tries to keep his legs elevated and on exam today his chronic venous insufficiency appears to be reasonably well controlled compared to his baseline.  Plan  He was encouraged to continue to elevate his legs when sitting around and was asked to have someone help him with the compression stocking application if he had someone available to do so.  We will reassess the stage of his chronic venous insufficiency at the follow-up visit.  In addition, we will continue the hydrochlorothiazide in hopes of maintaining some diuresis to keep the chronic venous insufficiency under control.

## 2018-05-28 ENCOUNTER — Telehealth: Payer: Self-pay | Admitting: *Deleted

## 2018-05-28 NOTE — Telephone Encounter (Signed)
Lady from [facility], states that pt has R lower leg swelling for 1 week, states pain has not increased, states there is no temperature change, no color change "just looks tight and swollen". He would like to come in to clinic Friday as he does not have transportation until Friday. appt given w/ dr Eppie Gibson at [phone] 12/6. She is cautioned to have pt call 911 if he has chest pain, shortness of breath, increased pain in leg, redness and heat or sudden coldness in leg. She is agreeable. She is adamant that appt is with dr Eppie Gibson and is reassured that it is scheduled w/ dr Eppie Gibson.

## 2018-05-29 NOTE — Telephone Encounter (Signed)
Thank you.  I am looking forward to examining [name].

## 2018-05-31 ENCOUNTER — Ambulatory Visit (INDEPENDENT_AMBULATORY_CARE_PROVIDER_SITE_OTHER): Payer: Medicare Other | Admitting: Internal Medicine

## 2018-05-31 ENCOUNTER — Other Ambulatory Visit: Payer: Self-pay

## 2018-05-31 ENCOUNTER — Encounter: Payer: Self-pay | Admitting: Internal Medicine

## 2018-05-31 ENCOUNTER — Ambulatory Visit (HOSPITAL_COMMUNITY): Payer: Medicare Other

## 2018-05-31 VITALS — BP 138/75 | HR 73 | Temp 98.0°F | Wt 277.7 lb

## 2018-05-31 DIAGNOSIS — I82401 Acute embolism and thrombosis of unspecified deep veins of right lower extremity: Secondary | ICD-10-CM

## 2018-05-31 DIAGNOSIS — M79604 Pain in right leg: Secondary | ICD-10-CM

## 2018-05-31 DIAGNOSIS — Z7901 Long term (current) use of anticoagulants: Secondary | ICD-10-CM | POA: Diagnosis not present

## 2018-05-31 DIAGNOSIS — I824Z1 Acute embolism and thrombosis of unspecified deep veins of right distal lower extremity: Secondary | ICD-10-CM

## 2018-05-31 DIAGNOSIS — I872 Venous insufficiency (chronic) (peripheral): Secondary | ICD-10-CM | POA: Diagnosis not present

## 2018-05-31 DIAGNOSIS — M7989 Other specified soft tissue disorders: Secondary | ICD-10-CM

## 2018-05-31 DIAGNOSIS — I82409 Acute embolism and thrombosis of unspecified deep veins of unspecified lower extremity: Secondary | ICD-10-CM

## 2018-05-31 HISTORY — DX: Acute embolism and thrombosis of unspecified deep veins of unspecified lower extremity: I82.409

## 2018-05-31 MED ORDER — ENOXAPARIN SODIUM 150 MG/ML ~~LOC~~ SOLN
1.0000 mg/kg | Freq: Once | SUBCUTANEOUS | Status: AC
  Administered 2018-05-31: 125 mg via SUBCUTANEOUS
  Filled 2018-05-31: qty 0.84

## 2018-05-31 NOTE — Progress Notes (Signed)
   Subjective:    Patient ID: [name], male    DOB: 09/16/1945, 72 y.o.   MRN: [id number]  HPI  [name] is here for an acute visit to address increased swelling and pain of his right lower extremity.  He has a long standing chronic venous insufficiency and has difficulty placing the compression stockings himself.  Approximately 7 to 10 days ago he noted increased swelling of his right lower extremity associated with an aching pain.  He denied any trauma, fevers, shakes, chills, chest pain, shortness of breath, or hemoptysis.  He has no previous history of blood clots.  He states that he has been ambulating about his apartment and has not been sedentary per se.  He admits to being unable to place his compression stockings and therefore has not used them recently.  As the swelling and discomfort have worsened he scheduled this appointment to have his symptoms further assessed.  He is without other acute complaints.  Review of Systems  Constitutional: Negative for activity change and fever.  Respiratory: Negative for chest tightness, shortness of breath and wheezing.   Cardiovascular: Positive for leg swelling. Negative for chest pain and palpitations.       Right > Left  Musculoskeletal: Positive for arthralgias. Negative for joint swelling.  Skin: Negative for wound.      Objective:   Physical Exam  Constitutional: He is oriented to person, place, and time. He appears well-developed and well-nourished. No distress.  HENT:  Head: Normocephalic and atraumatic.  Eyes: Right eye exhibits no discharge. Left eye exhibits no discharge. No scleral icterus.  Musculoskeletal: Normal range of motion. He exhibits edema and tenderness. He exhibits no deformity.  Swelling of the right lower extremity below the knee.  Right calf circumference 44 cm and left calf circumference 40 cm.  There is bilateral pitting edema Right > Left.  There are skin changes consistent with chronic venous  insufficiency but there are no ulcers at this time.  He has circumferential tenderness to palpation on the right lower extremity.  There is no warmth or erythema bilaterally.  Neurological: He is alert and oriented to person, place, and time. He exhibits normal muscle tone.  Skin: Skin is warm and dry. No rash noted. He is not diaphoretic. No erythema. No pallor.  Psychiatric: He has a normal mood and affect. His behavior is normal. Judgment and thought content normal.  Nursing note and vitals reviewed.     Assessment & Plan:   Please see problem based charting.

## 2018-05-31 NOTE — Assessment & Plan Note (Signed)
Assessment  His right lower extremity is 4 cm larger at the mid calf than the left lower extremity.  There is bilateral pitting edema and circumferential tenderness to palpation of the right lower extremity.  Fortunately there are no ulcerations, erythema, or warmth.  He has a long history of bilateral venous insufficiency but the right and left lower extremities are usually similar in size.  The change over the last 2 weeks is concerning.  The most obvious etiology in the differential would be a right lower extremity deep venous thrombosis.  His Wells DVT score is 2 consistent with a high probability for DVT.  We attempted to get a stat venous Doppler today at [facility] but the first available appointment was 4:30 PM.  He is dependent upon special transportation and technically cannot stay past 4 PM if he is to get a ride back home.  We therefore tried to get an appointment for early next week.  Unfortunately, all of the appointments that were available were again after 4 PM and we still would have the same limitations with regards to transportation.  They did have a 9 AM appointment for next Thursday, December 12.  We therefore scheduled him for that appointment.  Plan  He was given Lovenox 120 mg subcu x1 this morning.  He was also given samples of Xarelto 15 mg by mouth twice daily.  He has enough for 1 week.  He was asked to begin the Xarelto this afternoon.  He will return on December 12 at 9 AM to assess for evidence of a right lower extremity deep venous thrombosis.  If positive, he will require longer term anticoagulate therapy.  If negative he states he is able to put the right lower extremity compression stocking on himself and will begin to do so once the DVT has been ruled out.  Thus, further therapy will be more definitively defined once we have the results of the right lower extremity venous Doppler scheduled for December 12.  He was asked to return immediately to the emergency department  should he develop increasing swelling and pain or any evidence of bleeding.  As we were evaluating the risk benefit ratio we felt the benefits of oral anticoagulation at this point outweighed the costs and risks associated with waiting for a venous Doppler today as he would likely require admission for observation status to get it done.  We believe that he will tolerate the Xarelto well and be protected in case he does have a deep venous thrombosis seen on Doppler next Thursday.

## 2018-05-31 NOTE — Patient Instructions (Addendum)
It was great to see you.  I am sorry you have had pain and swelling in your right leg.  1) We need to make sure you do not have a blood clot.  You have an appointment for a doppler ultrasound of your right leg on Thursday, December 12 at 9 AM.  2) Take the Xarelto 15 mg twice a day until the 14 tablets run out (Thursday morning).  Start tonight.  3) I will call you at [phone] on Thursday with the results.  If you have increasing pain or any bleeding come to the Emergency Room immediately.

## 2018-06-01 ENCOUNTER — Emergency Department (HOSPITAL_BASED_OUTPATIENT_CLINIC_OR_DEPARTMENT_OTHER): Payer: Medicare Other

## 2018-06-01 ENCOUNTER — Emergency Department (HOSPITAL_COMMUNITY): Payer: Medicare Other

## 2018-06-01 ENCOUNTER — Emergency Department (HOSPITAL_COMMUNITY)
Admission: EM | Admit: 2018-06-01 | Discharge: 2018-06-01 | Disposition: A | Discharge status: Home / Self Care | Payer: Medicare Other | Attending: Emergency Medicine | Admitting: Emergency Medicine

## 2018-06-01 DIAGNOSIS — W19XXXA Unspecified fall, initial encounter: Secondary | ICD-10-CM | POA: Diagnosis not present

## 2018-06-01 DIAGNOSIS — Z79899 Other long term (current) drug therapy: Secondary | ICD-10-CM | POA: Insufficient documentation

## 2018-06-01 DIAGNOSIS — R58 Hemorrhage, not elsewhere classified: Secondary | ICD-10-CM | POA: Diagnosis not present

## 2018-06-01 DIAGNOSIS — Z87891 Personal history of nicotine dependence: Secondary | ICD-10-CM | POA: Diagnosis not present

## 2018-06-01 DIAGNOSIS — S0990XA Unspecified injury of head, initial encounter: Secondary | ICD-10-CM | POA: Diagnosis not present

## 2018-06-01 DIAGNOSIS — Y999 Unspecified external cause status: Secondary | ICD-10-CM | POA: Diagnosis not present

## 2018-06-01 DIAGNOSIS — W010XXA Fall on same level from slipping, tripping and stumbling without subsequent striking against object, initial encounter: Secondary | ICD-10-CM | POA: Insufficient documentation

## 2018-06-01 DIAGNOSIS — Y929 Unspecified place or not applicable: Secondary | ICD-10-CM | POA: Insufficient documentation

## 2018-06-01 DIAGNOSIS — R609 Edema, unspecified: Secondary | ICD-10-CM

## 2018-06-01 DIAGNOSIS — Z7901 Long term (current) use of anticoagulants: Secondary | ICD-10-CM | POA: Insufficient documentation

## 2018-06-01 DIAGNOSIS — I82411 Acute embolism and thrombosis of right femoral vein: Secondary | ICD-10-CM | POA: Diagnosis not present

## 2018-06-01 DIAGNOSIS — Z743 Need for continuous supervision: Secondary | ICD-10-CM | POA: Diagnosis not present

## 2018-06-01 DIAGNOSIS — Y9301 Activity, walking, marching and hiking: Secondary | ICD-10-CM | POA: Diagnosis not present

## 2018-06-01 DIAGNOSIS — I10 Essential (primary) hypertension: Secondary | ICD-10-CM | POA: Insufficient documentation

## 2018-06-01 DIAGNOSIS — R531 Weakness: Secondary | ICD-10-CM | POA: Diagnosis not present

## 2018-06-01 DIAGNOSIS — S0101XA Laceration without foreign body of scalp, initial encounter: Secondary | ICD-10-CM | POA: Insufficient documentation

## 2018-06-01 DIAGNOSIS — R404 Transient alteration of awareness: Secondary | ICD-10-CM | POA: Diagnosis not present

## 2018-06-01 MED ORDER — RIVAROXABAN (XARELTO) VTE STARTER PACK (15 & 20 MG): ORAL_TABLET | ORAL | 0 refills | Status: DC

## 2018-06-01 NOTE — ED Notes (Signed)
Upon review of medications, pt is discovered not to be on blood thinner. Takes daily 81mg  asa.

## 2018-06-01 NOTE — Discharge Instructions (Addendum)
Follow-up with your doctor.  He was notified about the positive duplex.  Have the staples taken out in around 7 to 14 days

## 2018-06-01 NOTE — ED Triage Notes (Signed)
Pt arrives to ED from home with complaints of falling after his wheelchair slipped out from under him since this afternoon. EMS reports pt denies LOC, has lac to posterior head, bleeding controlled, on blood thinner but patient cannot recall which one. Pt disoriented to time and has some repeat questioning. Pt placed in position of comfort with bed locked and lowered, call bell in reach.

## 2018-06-01 NOTE — ED Notes (Signed)
Patient verbalizes understanding of discharge instructions. Opportunity for questioning and answers were provided. Armband removed by staff, pt discharged from ED.  

## 2018-06-01 NOTE — ED Provider Notes (Signed)
[place] EMERGENCY DEPARTMENT Provider Note   CSN: [id number] Arrival date & time: 06/01/18  1421     History   Chief Complaint Chief Complaint  Patient presents with  . Fall  . Head Laceration    HPI [name] is a 72 y.o. male.  HPI Patient presents after a fall.  Was walking with sounds like a walker and fell backwards hitting his head.  No loss conscious.  Small laceration.  Reviewing records he had Lovenox yesterday and got started on Xarelto for presumed clot in his right leg.  Unknown last tetanus.  No neck pain.  No other injury.  States he had trouble getting up due to his chronic right leg pain/difficulty using.  Patient states he has been drinking gin today. Past Medical History:  Diagnosis Date  . 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  Excellent symptomatic response to LES Botox injections.   . AVM (arteriovenous malformation) of colon 08/26/2013   Cecum X 3, without bleeding on colonoscopy February 2015   . B12 deficiency 10/01/2011   Discovered to 2 progressive neurologic pain and dysfunction.  Diagnosis established March 2013.  Requires the following treatment: B12 IM monthly until level normalizes. After that, oral supplementation.   . Cataract   . Chronic venous insufficiency 11/28/2013  . Constipation due to opioid therapy 03/11/2015  . Diverticulosis 08/26/2013  . Essential hypertension 06/15/2006  . Family history of malignant neoplasm of gastrointestinal tract 06/30/2013   Father with colon cancer age 6   . Gastroesophageal reflux disease 02/04/2007  . Internal hemorrhoids 08/26/2013  . Left posterior subcapsular cataract 10/03/2013   s/p yag laser capsulotomy 10/03/2013   . 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 Lumbar stenosis with neurogenic claudication 06/16/2006   Moderate:  L2 through L4.  Complicated by chronic low back pain and neuropathy.  Nerve conduction study (10/19/11): Diffusely low motor amplitudes, with primarily involvement of the peroneal and posterior tibial nerves. No evidence of generalized peripheral neuropathy. Findings could be c/w bil multilevel lumbosacral radiculopathies or a primary motor neuropathy.   . Mild aortic valve stenosis 10/09/2006   Echo (12/09/2009): Valve area: 2.03 cm2   . Neuropathy   . Obesity, Class I, BMI 30-34.9 04/05/2012  . Onychomycosis of toenail 06/05/2014  . Osteoarthritis 11/25/2009   Right knee (medial compartment), right hip, left shoulder   . 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 Trigger little finger of right hand 03/11/2015    Patient Active Problem List   Diagnosis Date Noted  . Pain and swelling of right lower extremity 05/31/2018  . Trigger little finger of right hand 03/11/2015  . Constipation due to opioid therapy 03/11/2015  . Onychomycosis of toenail 06/05/2014  . Chronic venous insufficiency 11/28/2013  . Diverticulosis 08/26/2013  . Internal hemorrhoids 08/26/2013  . AVM (arteriovenous malformation) of colon 08/26/2013  . Achalasia 05/05/2013  . Healthcare maintenance 04/27/2013  . Rhegmatogenous retinal detachment of right eye 03/18/2013  . Abnormality of gait 10/02/2012  . Obesity, Class II, BMI 35-39.9 04/05/2012  . 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 B12 deficiency 10/01/2011  . Asteatotic eczema 09/21/2011  . Illiteracy 09/14/2010  . Osteoarthritis of right knee 11/25/2009  . Gastroesophageal reflux disease without esophagitis 02/04/2007  . Moderate calcific aortic stenosis 10/09/2006  .  Lumbar stenosis with neurogenic claudication 06/16/2006  . Essential hypertension 06/15/2006    Past Surgical History:  Procedure Laterality Date  . BOTOX INJECTION N/A 09/24/2013   Procedure: BOTOX INJECTION;   Surgeon: Inda Castle, MD;  Location: [facility];  Service: Endoscopy;  Laterality: N/A;  . BOTOX INJECTION N/A 07/17/2017   Procedure: BOTOX INJECTION;  Surgeon: Doran Stabler, MD;  Location: [facility];  Service: Gastroenterology;  Laterality: N/A;  . BOTOX INJECTION N/A 04/15/2018   Procedure: BOTOX INJECTION;  Surgeon: Doran Stabler, MD;  Location: [facility];  Service: Gastroenterology;  Laterality: N/A;  . CAPSULOTOMY Left 10/03/2013   Procedure: YAG CAPSULOTOMY OF LEFT EYE;  Surgeon: Myrtha Mantis., MD;  Location: [place];  Service: Ophthalmology;  Laterality: Left;  . CATARACT EXTRACTION  05/01/2012   left eye  . CATARACT EXTRACTION W/PHACO  11/08/2011   Procedure: CATARACT EXTRACTION PHACO AND INTRAOCULAR LENS PLACEMENT (IOC);  Surgeon: Adonis Brook, MD;  Location: [place];  Service: Ophthalmology;  Laterality: Right;  . CATARACT EXTRACTION W/PHACO  05/01/2012   Procedure: CATARACT EXTRACTION PHACO AND INTRAOCULAR LENS PLACEMENT (IOC);  Surgeon: Adonis Brook, MD;  Location: [place];  Service: Ophthalmology;  Laterality: Left;  . colonscopy    . ESOPHAGEAL MANOMETRY N/A 08/25/2013   Procedure: ESOPHAGEAL MANOMETRY (EM);  Surgeon: Inda Castle, MD;  Location: [facility];  Service: Endoscopy;  Laterality: N/A;  . ESOPHAGOGASTRODUODENOSCOPY N/A 09/24/2013   Procedure: ESOPHAGOGASTRODUODENOSCOPY (EGD);  Surgeon: Inda Castle, MD;  Location: [facility];  Service: Endoscopy;  Laterality: N/A;  . ESOPHAGOGASTRODUODENOSCOPY N/A 02/17/2014   Procedure: ESOPHAGOGASTRODUODENOSCOPY (EGD);  Surgeon: Inda Castle, MD;  Location: [facility];  Service: Endoscopy;  Laterality: N/A;  . ESOPHAGOGASTRODUODENOSCOPY (EGD) WITH PROPOFOL N/A 07/17/2017   Procedure: ESOPHAGOGASTRODUODENOSCOPY (EGD) WITH PROPOFOL;  Surgeon: Doran Stabler, MD;  Location: [facility];  Service: Gastroenterology;  Laterality: N/A;  with BOTOX  . ESOPHAGOGASTRODUODENOSCOPY (EGD) WITH PROPOFOL N/A  04/15/2018   Procedure: ESOPHAGOGASTRODUODENOSCOPY (EGD) WITH PROPOFOL;  Surgeon: Doran Stabler, MD;  Location: [facility];  Service: Gastroenterology;  Laterality: N/A;  . EYE SURGERY     cat ext right  . FRACTURE SURGERY     left shoulder  . GAS/FLUID EXCHANGE Right 07/02/2013   Procedure: GAS/FLUID EXCHANGE;  Surgeon: Adonis Brook, MD;  Location: [place];  Service: Ophthalmology;  Laterality: Right;  . PARS PLANA VITRECTOMY Right 07/02/2013   Procedure: PARS PLANA VITRECTOMY WITH 23 GAUGE WITH MEMBRANE PEEL AND ENDOLASER AND SILICONE OIL;  Surgeon: Adonis Brook, MD;  Location: [place];  Service: Ophthalmology;  Laterality: Right;  . SCLERAL BUCKLE Right 03/15/2013   Procedure: SCLERAL BUCKLE with 23Ga Vit;  Surgeon: Adonis Brook, MD;  Location: [place];  Service: Ophthalmology;  Laterality: Right;        Home Medications    Prior to Admission medications   Medication Sig Start Date End Date Taking? Authorizing Provider  bimatoprost (LUMIGAN) 0.01 % SOLN Place 1 drop into both eyes at bedtime.   Yes [provider]  COMBIGAN 0.2-0.5 % ophthalmic solution Place 1 drop into both eyes 2 (two) times daily. 04/24/16  Yes [provider]  gabapentin (NEURONTIN) 300 MG capsule Take 2 capsules (600 mg total) by mouth 3 (three) times daily. 04/19/18  Yes [name], MD  hydrochlorothiazide (HYDRODIURIL) 25 MG tablet Take 1 tablet (25 mg total) by mouth daily. 12/06/17  Yes [name], MD  hydrocortisone cream 1 % Apply to  itchy area 2 times daily 12/17/17 12/17/18 Yes [name], MD  omeprazole (PRILOSEC) 20 MG capsule Take 20 mg by mouth daily.   Yes [provider]  oxyCODONE-acetaminophen (PERCOCET) 10-325 MG tablet Take 1 tablet by mouth every 8 (eight) hours as needed for pain. 05/11/18  Yes [name], MD  sorbitol 70 % solution Take 15 mLs by mouth daily as needed. Patient taking differently: Take 15 mLs by mouth daily as needed (for constipation).   03/23/17  Yes [name], MD  verapamil (CALAN-SR) 180 MG CR tablet Take 1 tablet (180 mg total) by mouth daily. 02/06/18  Yes [name], MD  vitamin B-12 (CYANOCOBALAMIN) 1000 MCG tablet Take 1 tablet (1,000 mcg total) by mouth daily. 03/06/18  Yes [name], MD  cyclopentolate (CYCLODRYL,CYCLOGYL) 1 % ophthalmic solution Place 1 drop into both eyes daily. 05/21/18   [provider]  Rivaroxaban 15 & 20 MG TBPK Take as directed on package: Start with one 15mg  tablet by mouth twice a day with food. On Day 22, switch to one 20mg  tablet once a day with food. 06/01/18   Davonna Belling, MD    Family History Family History  Problem Relation Age of Onset  . Heart disease Mother   . Colon cancer Father 41  . Kidney disease Brother        on dialysis  . Pancreatic cancer Brother   . Hypertension Sister   . Diabetes Sister   . Kidney disease Brother        on dialysis  . Diabetes Sister   . Colon cancer Brother        2 brothers with colon cancer  . Cancer Daughter 24       Died at age of 33, unknown type  . Anesthesia problems Neg Hx     Social History Social History   Tobacco Use  . Smoking status: Former Smoker    Packs/day: 0.50    Years: 30.00    Pack years: 15.00    Types: Cigarettes    Last attempt to quit: 11/05/2010    Years since quitting: 7.5  . Smokeless tobacco: Never Used  Substance Use Topics  . Alcohol use: Yes    Alcohol/week: 0.0 standard drinks    Comment: every now and then  . Drug use: No     Allergies   Amlodipine   Review of Systems Review of Systems  Constitutional: Negative for appetite change.  HENT: Negative for congestion.   Respiratory: Negative for shortness of breath.   Cardiovascular: Positive for leg swelling. Negative for chest pain.  Gastrointestinal: Negative for abdominal pain.  Genitourinary: Negative for flank pain.  Musculoskeletal: Negative for back pain.  Skin: Positive for wound.  Neurological:  Positive for weakness.  Psychiatric/Behavioral: Negative for confusion.     Physical Exam Updated Vital Signs BP (!) 143/79   Pulse 75   Temp 98.9 F (37.2 C) (Oral)   Resp 19   Ht 6\' 4"  (1.93 m)   Wt 125.9 kg   SpO2 99%   BMI 33.79 kg/m   Physical Exam  Constitutional: He is oriented to person, place, and time. He appears well-developed.  HENT:  1.5 cm laceration left parietal occipital area.  Bleeding controlled.  Eyes:  Chronically irregular right pupil.  Neck:  No midline cervical tenderness.  Cardiovascular: Normal rate.  Pulmonary/Chest: He has no wheezes.  Abdominal: There is no tenderness.  Musculoskeletal: He exhibits edema.  Edema bilateral lower extremity, but worse  on the right.  Neurological: He is alert and oriented to person, place, and time.  Skin: Skin is warm. Capillary refill takes less than 2 seconds.     ED Treatments / Results  Labs (all labs ordered are listed, but only abnormal results are displayed) Labs Reviewed - No data to display  EKG None  Radiology Ct Head Wo Contrast  Result Date: 06/01/2018 CLINICAL DATA:  [place].  Hit head. EXAM: CT HEAD WITHOUT CONTRAST TECHNIQUE: Contiguous axial images were obtained from the base of the skull through the vertex without intravenous contrast. COMPARISON:  09/30/2010 FINDINGS: Brain: Stable age related cerebral atrophy, ventriculomegaly and periventricular white matter disease. No extra-axial fluid collections are identified. No CT findings for acute hemispheric infarction or intracranial hemorrhage. No mass lesions. The brainstem and cerebellum are normal. Vascular: Vascular calcifications but no aneurysm or hyperdense vessels. Skull: No acute skull fracture.  No bone lesions. Sinuses/Orbits: Chronic bilateral maxillary sinus disease with polyps or mucous retention cysts. Is also a small amount of fluid in the right maxillary sinus. The mastoid air cells and middle ear cavities are clear. The globes are  intact. Evidence of right scleral banding. Other: No scalp lesion or hematoma. IMPRESSION: 1. Stable mild cerebral atrophy, ventriculomegaly and periventricular white matter disease. 2. No acute intracranial findings or skull fracture. Electronically Signed   By: Marijo Sanes M.D.   On: 06/01/2018 16:57   Vas [place] Lower Extremity Venous (dvt) (only [facility])  Result Date: 06/01/2018  Lower Venous Study Indications: Edema.  Limitations: Body habitus. Performing Technologist: Lorina Rabon  Examination Guidelines: A complete evaluation includes B-mode imaging, spectral Doppler, color Doppler, and power Doppler as needed of all accessible portions of each vessel. Bilateral testing is considered an integral part of a complete examination. Limited examinations for reoccurring indications may be performed as noted.  Right Venous Findings: +---------+---------------+---------+-----------+--------------+---------------+          CompressibilityPhasicitySpontaneityProperties    Summary         +---------+---------------+---------+-----------+--------------+---------------+ CFV      Full           Yes      Yes                                      +---------+---------------+---------+-----------+--------------+---------------+ SFJ      Full                                                             +---------+---------------+---------+-----------+--------------+---------------+ FV Prox  Full                                                             +---------+---------------+---------+-----------+--------------+---------------+ FV Mid   Partial                                          Chronic         +---------+---------------+---------+-----------+--------------+---------------+ FV DistalPartial  partially     Chronic                                                     re-cannalized                  +---------+---------------+---------+-----------+--------------+---------------+ PFV      None                    No                       Chronic         +---------+---------------+---------+-----------+--------------+---------------+ POP      None                    No                       Age                                                                       Indeterminate   +---------+---------------+---------+-----------+--------------+---------------+ PTV      None                                             Chronic         +---------+---------------+---------+-----------+--------------+---------------+ PERO     None                                             Chronic         +---------+---------------+---------+-----------+--------------+---------------+  Left Venous Findings: +---+---------------+---------+-----------+----------+-------+    CompressibilityPhasicitySpontaneityPropertiesSummary +---+---------------+---------+-----------+----------+-------+ CFVFull           Yes      Yes                          +---+---------------+---------+-----------+----------+-------+    Summary: Right: Findings consistent with age indeterminate deep vein thrombosis involving the right popliteal vein. 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 No cystic structure found in the popliteal fossa. Left: No evidence of common femoral vein obstruction.  *See table(s) above for measurements and observations.    Preliminary     Procedures .Marland KitchenLaceration Repair Date/Time: 06/01/2018 5:52 PM Performed by: Davonna Belling, MD Authorized by: Davonna Belling, MD   Consent:    Consent obtained:  Verbal   Consent given by:  Patient   Risks discussed:  Infection, pain, retained foreign body, need for additional repair, poor wound healing and  vascular damage   Alternatives discussed:  No treatment Anesthesia (see MAR for exact dosages):    Anesthesia method:  None Laceration details:    Location:  Scalp   Scalp location:  Occipital  Length (cm):  1.5 Repair type:    Repair type:  Simple Pre-procedure details:    Preparation:  Imaging obtained to evaluate for foreign bodies Exploration:    Wound exploration: entire depth of wound probed and visualized     Contaminated: no   Treatment:    Area cleansed with:  Hibiclens   Amount of cleaning:  Standard Skin repair:    Repair method:  Staples   Number of staples:  1 Approximation:    Approximation:  Close Post-procedure details:    Dressing:  Open (no dressing)   Patient tolerance of procedure:  Tolerated well, no immediate complications   (including critical care time)  Medications Ordered in ED Medications - No data to display   Initial Impression / Assessment and Plan / ED Course  I have reviewed the triage vital signs and the nursing notes.  Pertinent labs & imaging results that were available during my care of the patient were reviewed by me and considered in my medical decision making (see chart for details).     Patient with fall.  He is on anticoagulation due to suspected and a blood clot in his right leg although he cannot have a Doppler.  Doppler done here did show clot.  No chest pain or trouble breathing.  Already on Xarelto.  We will continue the Xarelto and notice was given to PCP.  Staple put in wound on head.  Discharge home.  Final Clinical Impressions(s) / ED Diagnoses   Final diagnoses:  Laceration of scalp, initial encounter  Fall, initial encounter  Deep vein thrombosis (DVT) of femoral vein of right lower extremity, unspecified chronicity [facility])    ED Discharge Orders         Ordered    Rivaroxaban 15 & 20 MG TBPK     06/01/18 1746           Davonna Belling, MD 06/01/18 1753

## 2018-06-01 NOTE — ED Notes (Signed)
Vascular at bedside

## 2018-06-01 NOTE — Progress Notes (Signed)
Right lower extremity venous duplex exam completed. Positive for age indeterminate deep vein thrombosis involving the right popliteal vein. 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 Please see preliminary notes on CV PROC under chart review. Vahe Pienta H Barbera Perritt(RDMS RVT) 06/01/18 4:08 PM

## 2018-06-03 ENCOUNTER — Encounter: Payer: Self-pay | Admitting: Internal Medicine

## 2018-06-03 MED ORDER — RIVAROXABAN 15 MG PO TABS: 15.0000 mg | ORAL_TABLET | Freq: Two times a day (BID) | ORAL | 0 refills | Status: DC

## 2018-06-03 MED ORDER — RIVAROXABAN 20 MG PO TABS: 20.0000 mg | ORAL_TABLET | Freq: Every day | ORAL | 1 refills | Status: DC

## 2018-06-03 NOTE — Addendum Note (Signed)
Addended by: [name] D on: 06/03/2018 10:38 AM   Modules accepted: Orders

## 2018-06-03 NOTE — Progress Notes (Signed)
Patient ID: [name], male   DOB: 26-Apr-1946, 72 y.o.   MRN: [id number]  Patient had a fall on Saturday (day after this visit) and present to the ED.  A staple was put into a posterior head lac.  At the time, the ED was kind enough to obtain the doppler of the RLE and it did demonstrate a DVT.  I called [name] and he is doing well.  I informed him of the blood clot and asked him to take the Xarelto 15 mg PO BID that we had given him (1 week supply).  I will prescribe 15 mg PO BID for an additional 2 weeks and then 20 mg PO QD thereafter.  He will call his pharmacy to have this delivered to him in time before he runs out of the supply we gave him.  We will schedule follow-up in January and cancel the doppler scheduled for Thursday at 9 AM.

## 2018-06-05 ENCOUNTER — Telehealth: Payer: Self-pay | Admitting: *Deleted

## 2018-06-05 NOTE — Telephone Encounter (Signed)
-----   Message -----  From: [name], MD  Sent: 06/03/2018 10:46 AM EST  To: Imp Front Desk Pool  Subject: Please schedule [facility] appointment before Xmas    Please schedule Mr. Arlyce Harman for the [facility] in 10-14 days as a follow-up from the ED. He will need a staple removed from his laceration.   In addition, please see my AVS from last Friday which has his new cell phone number in it. Please update his mobile cell number to his new phone number.   Thanks,    Fritz Pickerel

## 2018-06-05 NOTE — Telephone Encounter (Signed)
Call made to patient-no answer-appt time/date left on recorder 12/20 @10 :45 in [place].  Will also mail appt.Regenia Skeeter, Petro Talent Cassady12/11/201910:25 AM

## 2018-06-06 ENCOUNTER — Encounter (HOSPITAL_COMMUNITY): Payer: Medicare Other

## 2018-06-12 ENCOUNTER — Ambulatory Visit: Payer: Medicare Other | Admitting: Podiatry

## 2018-06-14 ENCOUNTER — Encounter: Payer: Self-pay | Admitting: Internal Medicine

## 2018-06-14 ENCOUNTER — Other Ambulatory Visit: Payer: Self-pay

## 2018-06-14 ENCOUNTER — Ambulatory Visit (INDEPENDENT_AMBULATORY_CARE_PROVIDER_SITE_OTHER): Payer: Medicare Other | Admitting: Internal Medicine

## 2018-06-14 VITALS — BP 112/61 | HR 65 | Temp 98.0°F | Ht 74.0 in | Wt 278.4 lb

## 2018-06-14 DIAGNOSIS — Z86718 Personal history of other venous thrombosis and embolism: Secondary | ICD-10-CM

## 2018-06-14 DIAGNOSIS — M1711 Unilateral primary osteoarthritis, right knee: Secondary | ICD-10-CM | POA: Diagnosis not present

## 2018-06-14 DIAGNOSIS — G8929 Other chronic pain: Secondary | ICD-10-CM | POA: Diagnosis not present

## 2018-06-14 DIAGNOSIS — S0101XD Laceration without foreign body of scalp, subsequent encounter: Secondary | ICD-10-CM | POA: Diagnosis not present

## 2018-06-14 DIAGNOSIS — I878 Other specified disorders of veins: Secondary | ICD-10-CM | POA: Diagnosis not present

## 2018-06-14 DIAGNOSIS — W19XXXD Unspecified fall, subsequent encounter: Secondary | ICD-10-CM | POA: Diagnosis not present

## 2018-06-14 DIAGNOSIS — I82511 Chronic embolism and thrombosis of right femoral vein: Secondary | ICD-10-CM

## 2018-06-14 DIAGNOSIS — Z79891 Long term (current) use of opiate analgesic: Secondary | ICD-10-CM

## 2018-06-14 DIAGNOSIS — Z7901 Long term (current) use of anticoagulants: Secondary | ICD-10-CM

## 2018-06-14 DIAGNOSIS — S0101XA Laceration without foreign body of scalp, initial encounter: Secondary | ICD-10-CM | POA: Insufficient documentation

## 2018-06-14 MED ORDER — OXYCODONE-ACETAMINOPHEN 10-325 MG PO TABS: 1.0000 | ORAL_TABLET | Freq: Three times a day (TID) | ORAL | 0 refills | Status: DC | PRN

## 2018-06-14 NOTE — Progress Notes (Signed)
CC: Scalp Laceration, Follow up  HPI:   [name] is a 72 y.o. M/ with PMHx listed below presenting for Scalp Laceration. Please see the A&P for the status of the patient's chronic medical problems.  Patient was seen on 06/01/18 in the ED for a small scalp laceration following a fall. A CT Head was performed which showed no acute intracranial abnormality nor fracture. He had no loss of consciousness, significant bleeding, nor other sequela from the fall. He had a skin staple place for wound closure.  He also had a RLE doppler performed for a presumed DVT (Dopplar had be scheduled for later). This doppler confirmed DVT and patient was continued on anticoagulation, which had already been started.  Patient follows up today for removal of skin staple. He also requests fitting for compression stockings and refill of pain medication as his prescription is about to run out and he does not have refills.  Past Medical History:  Diagnosis Date  . 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  Excellent symptomatic response to LES Botox injections.   . AVM (arteriovenous malformation) of colon 08/26/2013   Cecum X 3, without bleeding on colonoscopy February 2015   . B12 deficiency 10/01/2011   Discovered to 2 progressive neurologic pain and dysfunction.  Diagnosis established March 2013.  Requires the following treatment: B12 IM monthly until level normalizes. After that, oral supplementation.   . Cataract   . Chronic venous insufficiency 11/28/2013  . Constipation due to opioid therapy 03/11/2015  . Deep venous thrombosis ([place]) 05/31/2018   Right lower extremity, unprovoked  . Diverticulosis 08/26/2013  . Essential hypertension 06/15/2006  . Family history of malignant neoplasm of gastrointestinal tract 06/30/2013   Father with colon cancer age 48   . Gastroesophageal reflux disease 02/04/2007  . Internal  hemorrhoids 08/26/2013  . Left posterior subcapsular cataract 10/03/2013   s/p yag laser capsulotomy 10/03/2013   . 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  Complicated by chronic low back pain and neuropathy.  Nerve conduction study (10/19/11): Diffusely low motor amplitudes, with primarily involvement of the peroneal and posterior tibial nerves. No evidence of generalized peripheral neuropathy. Findings could be c/w bil multilevel lumbosacral radiculopathies or a primary motor neuropathy.   . Mild aortic valve stenosis 10/09/2006   Echo (12/09/2009): Valve area: 2.03 cm2   . Neuropathy   . Obesity, Class I, BMI 30-34.9 04/05/2012  . Onychomycosis of toenail 06/05/2014  . Osteoarthritis 11/25/2009   Right knee (medial compartment), right hip, left shoulder   . 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 Trigger little finger of right hand 03/11/2015   Review of Systems:  Performed and all others negative.  Physical Exam:  Vitals:   06/14/18 1109  BP: 112/61  Pulse: 65  Temp: 98 F (36.7 C)  TempSrc: Oral  SpO2: 100%  Weight: 278 lb 6.4 oz (126.3 kg)  Height: 6\' 2"  (1.88 m)   Physical Exam Vitals signs and nursing note reviewed.  Constitutional:      General: He is not in acute distress.    Appearance: He is well-developed. He is not diaphoretic.  HENT:     Head:     Comments: Small ~1.5cm healing laceration to posterior L parietal  scalp with single skin staple in place Eyes:     General: No scleral icterus.       Right eye: No discharge.        Left eye: No discharge.  Musculoskeletal: Normal range of motion.     Comments: Bilateral LE edema R>L below the knee. Venous Stasis skin changes  Skin:    General: Skin is warm and dry.  Neurological:     Mental Status: He is alert and  oriented to person, place, and time.     Motor: No abnormal muscle tone.  Psychiatric:        Behavior: Behavior normal.        Thought Content: Thought content normal.        Judgment: Judgment normal.    Assessment & Plan:   See Encounters Tab for problem based charting.  Patient discussed with Dr. Angelia Mould

## 2018-06-14 NOTE — Assessment & Plan Note (Signed)
Patient requesting refill of his chronic pain medication for his OA. He states his pain is still controlled on this medications with ibuprofen. His prescription is confirmed to be about to expire. Database reviewed and appropriate. Will provide 1 month refill. - Refill provided for Percocet 10-325 q8h PRN #75

## 2018-06-14 NOTE — Patient Instructions (Addendum)
Thank you for allowing [place] to care for you  For your scalp laceration - Staple removed from scalp today  For your blood clot - Continue Xarelto as prescribed  Chronic OA Pain - Refill provided today - Please follow up with your PCP for further refills  TED hose measurement performed today  Follow up with your PCP as usual

## 2018-06-14 NOTE — Assessment & Plan Note (Signed)
Patient had a RLE doppler performed while being seen in the ED for a fall and laceration on 12/7. This doppler confirmed RLE DVT and patient was continued on anticoagulation. He states the pain is starting to improve in his RLE, but swelling remains. On exam RLE remains more edematous than the left.  - Continue Xarelto

## 2018-06-14 NOTE — Assessment & Plan Note (Signed)
Patient was seen on 06/01/18 in the ED for a small scalp laceration following a fall. A CT Head was performed which showed no acute intracranial abnormality nor fracture. He had no loss of consciousness, significant bleeding, nor other sequela from the fall. He had a skin staple place for wound closure.  Laceration is well healing on exam with single skin staple in place. This was removed with stable remover with no complications. No further falls.

## 2018-06-14 NOTE — Progress Notes (Signed)
Internal Medicine Clinic Attending  Case discussed with Dr. Melvin  at the time of the visit.  We reviewed the resident's history and exam and pertinent patient test results.  I agree with the assessment, diagnosis, and plan of care documented in the resident's note.  

## 2018-06-20 ENCOUNTER — Ambulatory Visit (INDEPENDENT_AMBULATORY_CARE_PROVIDER_SITE_OTHER): Payer: Medicare Other | Admitting: Podiatry

## 2018-06-20 ENCOUNTER — Encounter: Payer: Self-pay | Admitting: Podiatry

## 2018-06-20 DIAGNOSIS — M79675 Pain in left toe(s): Secondary | ICD-10-CM

## 2018-06-20 DIAGNOSIS — M79674 Pain in right toe(s): Secondary | ICD-10-CM

## 2018-06-20 DIAGNOSIS — B351 Tinea unguium: Secondary | ICD-10-CM

## 2018-06-20 DIAGNOSIS — L84 Corns and callosities: Secondary | ICD-10-CM

## 2018-06-20 DIAGNOSIS — Q828 Other specified congenital malformations of skin: Secondary | ICD-10-CM

## 2018-06-20 DIAGNOSIS — D689 Coagulation defect, unspecified: Secondary | ICD-10-CM

## 2018-06-20 NOTE — Progress Notes (Signed)
This patient presents to the office with chief complaint of painful long thick nails.  He also says he has multiple painful calluses on the bottom of both feet.  He says his nails and his calluses are painful, causing him to be in pain when he walks.  He presents to the office today for preventative foot care services.   General Appearance  Alert, conversant and in no acute stress.  Vascular  Dorsalis pedis and posterior pulses are weakly  palpable  bilaterally.  Capillary return is within normal limits  bilaterally. Temperature is within normal limits  Bilaterally.  Neurologic  Senn-Weinstein monofilament wire test within normal limits  bilaterally. Muscle power within normal limits bilaterally.  Nails Thick disfigured discolored nails with subungual debris bilaterally from hallux to fifth toes bilaterally. No evidence of bacterial infection or drainage bilaterally.Pes planus foot type.  Orthopedic  No limitations of motion of motion feet bilaterally.  No crepitus or effusions noted.  No bony pathology or digital deformities noted.  Skin  normotropic skin.   Multiple porokeratosis lesions  B/L. Especially sub 5 left and sub 1 right.  No signs of infections or ulcers noted.     Onychomycosis  B/L.  Porokeratosis  Forefeet  B/L.     Debridement of nails  B/L.  Debridement of multiple porokeratosis  B/L   RTC 10 weeks.   Gardiner Barefoot DPM

## 2018-07-09 ENCOUNTER — Other Ambulatory Visit: Payer: Self-pay | Admitting: Internal Medicine

## 2018-07-09 DIAGNOSIS — M1711 Unilateral primary osteoarthritis, right knee: Secondary | ICD-10-CM

## 2018-07-09 NOTE — Telephone Encounter (Signed)
Last rx written 06/14/18. Last OV 12/20 with Dr Trilby Drummer; 12/6 with Dr Eppie Gibson. UDS  08/28/17.

## 2018-07-11 MED ORDER — OXYCODONE-ACETAMINOPHEN 10-325 MG PO TABS: 1.0000 | ORAL_TABLET | Freq: Three times a day (TID) | ORAL | 0 refills | Status: DC | PRN

## 2018-07-11 NOTE — Telephone Encounter (Signed)
[name] Controlled Substance Database reviewed and there are o red flags.  Three 72-month prescriptions were sent electronically to his pharmacy.

## 2018-08-19 ENCOUNTER — Encounter: Payer: Self-pay | Admitting: Internal Medicine

## 2018-08-19 ENCOUNTER — Ambulatory Visit (INDEPENDENT_AMBULATORY_CARE_PROVIDER_SITE_OTHER): Payer: Medicare Other | Admitting: Internal Medicine

## 2018-08-19 ENCOUNTER — Other Ambulatory Visit: Payer: Self-pay

## 2018-08-19 VITALS — BP 146/61 | HR 80 | Temp 98.2°F | Ht 73.0 in | Wt 273.9 lb

## 2018-08-19 DIAGNOSIS — H5500 Unspecified nystagmus: Secondary | ICD-10-CM

## 2018-08-19 DIAGNOSIS — E860 Dehydration: Secondary | ICD-10-CM

## 2018-08-19 DIAGNOSIS — I951 Orthostatic hypotension: Secondary | ICD-10-CM | POA: Diagnosis not present

## 2018-08-19 DIAGNOSIS — Z86718 Personal history of other venous thrombosis and embolism: Secondary | ICD-10-CM

## 2018-08-19 DIAGNOSIS — N179 Acute kidney failure, unspecified: Secondary | ICD-10-CM | POA: Diagnosis not present

## 2018-08-19 DIAGNOSIS — R42 Dizziness and giddiness: Secondary | ICD-10-CM

## 2018-08-19 DIAGNOSIS — Z7901 Long term (current) use of anticoagulants: Secondary | ICD-10-CM

## 2018-08-19 DIAGNOSIS — Z79899 Other long term (current) drug therapy: Secondary | ICD-10-CM

## 2018-08-19 NOTE — Patient Instructions (Signed)
Mr. Steele,   We think your dizziness may be caused by your blood pressure medications.  Because of this we will ask you to please stop taking hydrochlorothiazide (this is the medication that makes you pee).  We will also check your electrolytes and your blood counts today we will give you a call if the results are not normal.  Please make a follow-up appointment with your regular doctor, Dr. Eppie Gibson, in 4 weeks to recheck your blood pressure.  -Dr. Frederico Hamman

## 2018-08-20 ENCOUNTER — Telehealth: Payer: Self-pay

## 2018-08-20 ENCOUNTER — Encounter: Payer: Self-pay | Admitting: Internal Medicine

## 2018-08-20 DIAGNOSIS — R42 Dizziness and giddiness: Secondary | ICD-10-CM | POA: Insufficient documentation

## 2018-08-20 DIAGNOSIS — D649 Anemia, unspecified: Secondary | ICD-10-CM

## 2018-08-20 HISTORY — DX: Anemia, unspecified: D64.9

## 2018-08-20 LAB — CBC
HEMATOCRIT: 23.5 % — AB (ref 37.5–51.0)
Hemoglobin: 7.4 g/dL — ABNORMAL LOW (ref 13.0–17.7)
MCH: 28.6 pg (ref 26.6–33.0)
MCHC: 31.5 g/dL (ref 31.5–35.7)
MCV: 91 fL (ref 79–97)
PLATELETS: 318 10*3/uL (ref 150–450)
RBC: 2.59 x10E6/uL — CL (ref 4.14–5.80)
RDW: 15.6 % — AB (ref 11.6–15.4)
WBC: 8.4 10*3/uL (ref 3.4–10.8)

## 2018-08-20 LAB — BMP8+ANION GAP
ANION GAP: 13 mmol/L (ref 10.0–18.0)
BUN/Creatinine Ratio: 15 (ref 10–24)
BUN: 19 mg/dL (ref 8–27)
CHLORIDE: 102 mmol/L (ref 96–106)
CO2: 24 mmol/L (ref 20–29)
Calcium: 9.6 mg/dL (ref 8.6–10.2)
Creatinine, Ser: 1.3 mg/dL — ABNORMAL HIGH (ref 0.76–1.27)
GFR calc Af Amer: 62 mL/min/{1.73_m2} (ref >59)
GFR, EST NON AFRICAN AMERICAN: 53 mL/min/{1.73_m2} — AB (ref >59)
GLUCOSE: 88 mg/dL (ref 65–99)
POTASSIUM: 3.7 mmol/L (ref 3.5–5.2)
SODIUM: 139 mmol/L (ref 134–144)

## 2018-08-20 NOTE — Telephone Encounter (Signed)
Spoke with patient and his cna-informed both parties that pt needs to return to clinic to be evaluated for "abnormal labs". Pt agreed to be seen tomorrow am at 0915 .Despina Hidden Cassady2/25/20204:54 PM

## 2018-08-20 NOTE — Telephone Encounter (Signed)
TC to patient on home and mobile numbers, message states "VM box not set up yet" and "person not available". RN attempting to reach pt per Dr. Carlynn Purl instructions regarding H&H results.  Pt needs appt in [facility] for f/u today per MD.  Will continue to attempt to reach pt. SChaplin, RN,BSN

## 2018-08-20 NOTE — Progress Notes (Signed)
   CC: Dizziness   HPI:  [name] is a 73 y.o. year-old male with PMH listed below who presents to clinic for evaluation of dizziness. Please see problem based assessment and plan for further details.   Past Medical History:  Diagnosis Date  . 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  Excellent symptomatic response to LES Botox injections.   . AVM (arteriovenous malformation) of colon 08/26/2013   Cecum X 3, without bleeding on colonoscopy February 2015   . B12 deficiency 10/01/2011   Discovered to 2 progressive neurologic pain and dysfunction.  Diagnosis established March 2013.  Requires the following treatment: B12 IM monthly until level normalizes. After that, oral supplementation.   . Cataract   . Chronic venous insufficiency 11/28/2013  . Constipation due to opioid therapy 03/11/2015  . Deep venous thrombosis ([place]) 05/31/2018   Right lower extremity, unprovoked  . Diverticulosis 08/26/2013  . Essential hypertension 06/15/2006  . Family history of malignant neoplasm of gastrointestinal tract 06/30/2013   Father with colon cancer age 20   . Gastroesophageal reflux disease 02/04/2007  . Internal hemorrhoids 08/26/2013  . Left posterior subcapsular cataract 10/03/2013   s/p yag laser capsulotomy 10/03/2013   . 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  Complicated by chronic low back pain and neuropathy.  Nerve conduction study (10/19/11): Diffusely low motor amplitudes, with primarily involvement of the peroneal and posterior tibial nerves. No evidence of generalized peripheral neuropathy. Findings could be c/w bil multilevel lumbosacral radiculopathies or a primary motor neuropathy.   . Mild aortic valve stenosis 10/09/2006   Echo (12/09/2009): Valve  area: 2.03 cm2   . Neuropathy   . Obesity, Class I, BMI 30-34.9 04/05/2012  . Onychomycosis of toenail 06/05/2014  . Osteoarthritis 11/25/2009   Right knee (medial compartment), right hip, left shoulder   . 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 Trigger little finger of right hand 03/11/2015   Review of Systems:   Review of Systems  Constitutional: Positive for malaise/fatigue. Negative for chills and fever.  HENT: Negative for congestion, ear pain, hearing loss and tinnitus.   Respiratory: Positive for shortness of breath.   Cardiovascular: Negative for chest pain.  Gastrointestinal: Negative for abdominal pain, blood in stool and melena.  Musculoskeletal: Negative for falls.  Neurological: Positive for dizziness. Negative for loss of consciousness and weakness.    Physical Exam: Vitals:   08/19/18 1329  BP: (!) 146/61  Pulse: 80  Temp: 98.2 F (36.8 C)  TempSrc: Oral  SpO2: 100%  Weight: 273 lb 14.4 oz (124.2 kg)  Height: 6\' 1"  (1.854 m)   General: tired-appearing male in no acute distress  Cardiac: regular rate and rhythm, nl S1/S2, no murmurs, rubs or gallops  Pulm: CTAB, no wheezes or crackles, no increased work of breathing on room air  Neuro: A&Ox3, left-beating horizontal nystagmus otherwise CN II-XII intact, sensation intact in all four extremities, no motor deficits, no dysmetria Ext: warm and well perfused, trace - 1+ pitting edema bilaterally and symmetric    Assessment & Plan:   See Encounters Tab for problem based charting.  Patient discussed with Dr. Angelia Mould

## 2018-08-20 NOTE — Assessment & Plan Note (Addendum)
[name] presents with a 1-2 week history of dizziness upon standing from a sitting position that he describes as feeling drunk. He has also been experiencing shortness of breath during this time. He does not feel the room spinning and denies hearing loss, tinnitus, recent illness. focal weakness or changes in sensation, falls, and LOC. On exam, he is orthostatic from supine to sitting (sBP 140-> 126). His mucus membranes are dry and skin turgor is decreased. He does have L-beating nystagmus on neuro exam.   Orthostatic hypotension secondary to dehydration. Could also have a component of BPPV with nystagmus on exam. Unable to perform Dix-Hallpike in clinic. Low suspicion for central etiology such as CVA. Will stop HCTZ. Ordered CBC to check for anemia and BMP to assess renal function and check electrolytes.   ADDENDUM: Hgb 7.4 with MCV 91. Prior Hgb 13.6 five years ago. He also has a mild AKI (Cr 1.3). I suspect this is a chronic process given no hemodynamic instability, and will need further workup for normocytic anemia with blood smear, retic count, iron studies and B12. He is on Xarelto for an unprovoked RLE DVT found in 05/2018 but denies hematuria, hematochezia, and melena. I called him and discussed results. He will be seen in [facility] 2/26 for re-evaluation. Will repeat CBC during this visit as well to ensure Hgb is stable vs worsening. Will plan to give IVF during visit, but may need admission for symptomatic anemia.

## 2018-08-20 NOTE — Telephone Encounter (Signed)
Attempted to reach pt on all 3 contact numbers listed. "VM box not set up, person not available, no answer". SChaplin, RN,BSN

## 2018-08-20 NOTE — Telephone Encounter (Signed)
Attempted to call "Lauren @ [phone] who was the Aide who accompanied Mr. Schlitt at his visit on 08/19/18, VM box has not been set up yet, unable to leave message. RN still unable to reach pt at all contact numbers due to "VM box not set up yet and no answer". Dr. Frederico Hamman made aware. SChaplin, RN,BSN

## 2018-08-21 ENCOUNTER — Encounter: Payer: Self-pay | Admitting: Internal Medicine

## 2018-08-21 ENCOUNTER — Inpatient Hospital Stay (HOSPITAL_COMMUNITY)
Admission: AD | Admit: 2018-08-21 | Discharge: 2018-08-25 | DRG: 378 | Disposition: A | Discharge status: Home Health | Payer: Medicare Other | Source: Ambulatory Visit | Attending: Internal Medicine | Admitting: Internal Medicine

## 2018-08-21 ENCOUNTER — Encounter (HOSPITAL_COMMUNITY): Payer: Self-pay | Admitting: General Practice

## 2018-08-21 ENCOUNTER — Other Ambulatory Visit: Payer: Self-pay

## 2018-08-21 ENCOUNTER — Ambulatory Visit (INDEPENDENT_AMBULATORY_CARE_PROVIDER_SITE_OTHER): Payer: Medicare Other | Admitting: Internal Medicine

## 2018-08-21 VITALS — BP 119/59 | HR 67 | Temp 98.0°F | Ht 73.0 in | Wt 276.3 lb

## 2018-08-21 DIAGNOSIS — D649 Anemia, unspecified: Secondary | ICD-10-CM | POA: Diagnosis not present

## 2018-08-21 DIAGNOSIS — K31811 Angiodysplasia of stomach and duodenum with bleeding: Secondary | ICD-10-CM | POA: Diagnosis not present

## 2018-08-21 DIAGNOSIS — G8929 Other chronic pain: Secondary | ICD-10-CM | POA: Diagnosis not present

## 2018-08-21 DIAGNOSIS — Z7982 Long term (current) use of aspirin: Secondary | ICD-10-CM

## 2018-08-21 DIAGNOSIS — K5900 Constipation, unspecified: Secondary | ICD-10-CM | POA: Diagnosis not present

## 2018-08-21 DIAGNOSIS — R195 Other fecal abnormalities: Secondary | ICD-10-CM | POA: Diagnosis not present

## 2018-08-21 DIAGNOSIS — K922 Gastrointestinal hemorrhage, unspecified: Secondary | ICD-10-CM

## 2018-08-21 DIAGNOSIS — K228 Other specified diseases of esophagus: Secondary | ICD-10-CM | POA: Diagnosis not present

## 2018-08-21 DIAGNOSIS — Z7901 Long term (current) use of anticoagulants: Secondary | ICD-10-CM | POA: Diagnosis not present

## 2018-08-21 DIAGNOSIS — I350 Nonrheumatic aortic (valve) stenosis: Secondary | ICD-10-CM | POA: Diagnosis not present

## 2018-08-21 DIAGNOSIS — G629 Polyneuropathy, unspecified: Secondary | ICD-10-CM | POA: Diagnosis not present

## 2018-08-21 DIAGNOSIS — I119 Hypertensive heart disease without heart failure: Secondary | ICD-10-CM | POA: Diagnosis not present

## 2018-08-21 DIAGNOSIS — K648 Other hemorrhoids: Secondary | ICD-10-CM | POA: Diagnosis not present

## 2018-08-21 DIAGNOSIS — K641 Second degree hemorrhoids: Secondary | ICD-10-CM | POA: Diagnosis present

## 2018-08-21 DIAGNOSIS — I10 Essential (primary) hypertension: Secondary | ICD-10-CM | POA: Diagnosis not present

## 2018-08-21 DIAGNOSIS — I872 Venous insufficiency (chronic) (peripheral): Secondary | ICD-10-CM | POA: Diagnosis not present

## 2018-08-21 DIAGNOSIS — N179 Acute kidney failure, unspecified: Secondary | ICD-10-CM | POA: Diagnosis not present

## 2018-08-21 DIAGNOSIS — F17210 Nicotine dependence, cigarettes, uncomplicated: Secondary | ICD-10-CM | POA: Diagnosis present

## 2018-08-21 DIAGNOSIS — I998 Other disorder of circulatory system: Secondary | ICD-10-CM | POA: Diagnosis present

## 2018-08-21 DIAGNOSIS — M545 Low back pain: Secondary | ICD-10-CM | POA: Diagnosis not present

## 2018-08-21 DIAGNOSIS — Z86718 Personal history of other venous thrombosis and embolism: Secondary | ICD-10-CM | POA: Diagnosis not present

## 2018-08-21 DIAGNOSIS — L308 Other specified dermatitis: Secondary | ICD-10-CM | POA: Diagnosis not present

## 2018-08-21 DIAGNOSIS — E538 Deficiency of other specified B group vitamins: Secondary | ICD-10-CM | POA: Diagnosis not present

## 2018-08-21 DIAGNOSIS — Z800 Family history of malignant neoplasm of digestive organs: Secondary | ICD-10-CM

## 2018-08-21 DIAGNOSIS — E669 Obesity, unspecified: Secondary | ICD-10-CM | POA: Diagnosis present

## 2018-08-21 DIAGNOSIS — K5790 Diverticulosis of intestine, part unspecified, without perforation or abscess without bleeding: Secondary | ICD-10-CM

## 2018-08-21 DIAGNOSIS — D500 Iron deficiency anemia secondary to blood loss (chronic): Secondary | ICD-10-CM | POA: Diagnosis not present

## 2018-08-21 DIAGNOSIS — D125 Benign neoplasm of sigmoid colon: Secondary | ICD-10-CM | POA: Diagnosis not present

## 2018-08-21 DIAGNOSIS — K5521 Angiodysplasia of colon with hemorrhage: Secondary | ICD-10-CM | POA: Diagnosis not present

## 2018-08-21 DIAGNOSIS — D509 Iron deficiency anemia, unspecified: Secondary | ICD-10-CM | POA: Diagnosis not present

## 2018-08-21 DIAGNOSIS — K5520 Angiodysplasia of colon without hemorrhage: Secondary | ICD-10-CM | POA: Diagnosis not present

## 2018-08-21 DIAGNOSIS — Q438 Other specified congenital malformations of intestine: Secondary | ICD-10-CM | POA: Diagnosis not present

## 2018-08-21 DIAGNOSIS — K219 Gastro-esophageal reflux disease without esophagitis: Secondary | ICD-10-CM | POA: Diagnosis present

## 2018-08-21 DIAGNOSIS — K31819 Angiodysplasia of stomach and duodenum without bleeding: Secondary | ICD-10-CM | POA: Diagnosis not present

## 2018-08-21 DIAGNOSIS — Z79899 Other long term (current) drug therapy: Secondary | ICD-10-CM

## 2018-08-21 DIAGNOSIS — H409 Unspecified glaucoma: Secondary | ICD-10-CM | POA: Diagnosis present

## 2018-08-21 DIAGNOSIS — Z8249 Family history of ischemic heart disease and other diseases of the circulatory system: Secondary | ICD-10-CM

## 2018-08-21 DIAGNOSIS — Z6836 Body mass index (BMI) 36.0-36.9, adult: Secondary | ICD-10-CM

## 2018-08-21 DIAGNOSIS — K635 Polyp of colon: Secondary | ICD-10-CM | POA: Diagnosis not present

## 2018-08-21 DIAGNOSIS — L853 Xerosis cutis: Secondary | ICD-10-CM | POA: Diagnosis present

## 2018-08-21 DIAGNOSIS — K5730 Diverticulosis of large intestine without perforation or abscess without bleeding: Secondary | ICD-10-CM | POA: Diagnosis not present

## 2018-08-21 DIAGNOSIS — M1990 Unspecified osteoarthritis, unspecified site: Secondary | ICD-10-CM | POA: Diagnosis present

## 2018-08-21 DIAGNOSIS — R0602 Shortness of breath: Secondary | ICD-10-CM | POA: Diagnosis present

## 2018-08-21 DIAGNOSIS — M48062 Spinal stenosis, lumbar region with neurogenic claudication: Secondary | ICD-10-CM | POA: Diagnosis present

## 2018-08-21 DIAGNOSIS — K220 Achalasia of cardia: Secondary | ICD-10-CM | POA: Diagnosis not present

## 2018-08-21 DIAGNOSIS — I82401 Acute embolism and thrombosis of unspecified deep veins of right lower extremity: Secondary | ICD-10-CM

## 2018-08-21 DIAGNOSIS — Z888 Allergy status to other drugs, medicaments and biological substances status: Secondary | ICD-10-CM

## 2018-08-21 HISTORY — DX: Gastrointestinal hemorrhage, unspecified: K92.2

## 2018-08-21 HISTORY — DX: Anemia, unspecified: D64.9

## 2018-08-21 LAB — IRON AND TIBC
Iron: 15 ug/dL — ABNORMAL LOW (ref 45–182)
SATURATION RATIOS: 3 % — AB (ref 17.9–39.5)
TIBC: 536 ug/dL — ABNORMAL HIGH (ref 250–450)
UIBC: 521 ug/dL

## 2018-08-21 LAB — CBC WITH DIFFERENTIAL/PLATELET
ABS IMMATURE GRANULOCYTES: 0.02 10*3/uL (ref 0.00–0.07)
BASOS ABS: 0 10*3/uL (ref 0.0–0.1)
BASOS PCT: 0 %
EOS PCT: 0 %
Eosinophils Absolute: 0 10*3/uL (ref 0.0–0.5)
HEMATOCRIT: 23.2 % — AB (ref 39.0–52.0)
HEMOGLOBIN: 7.1 g/dL — AB (ref 13.0–17.0)
Immature Granulocytes: 0 %
Lymphocytes Relative: 17 %
Lymphs Abs: 1.1 10*3/uL (ref 0.7–4.0)
MCH: 27.5 pg (ref 26.0–34.0)
MCHC: 30.6 g/dL (ref 30.0–36.0)
MCV: 89.9 fL (ref 80.0–100.0)
MONO ABS: 0.7 10*3/uL (ref 0.1–1.0)
MONOS PCT: 10 %
Neutro Abs: 4.9 10*3/uL (ref 1.7–7.7)
Neutrophils Relative %: 73 %
Platelets: 307 10*3/uL (ref 150–400)
RBC: 2.58 MIL/uL — AB (ref 4.22–5.81)
RDW: 16.7 % — ABNORMAL HIGH (ref 11.5–15.5)
WBC: 6.7 10*3/uL (ref 4.0–10.5)
nRBC: 0.3 % — ABNORMAL HIGH (ref 0.0–0.2)

## 2018-08-21 LAB — SAMPLE TO BLOOD BANK

## 2018-08-21 LAB — RETICULOCYTES
Immature Retic Fract: 32.5 % — ABNORMAL HIGH (ref 2.3–15.9)
RBC.: 2.58 MIL/uL — ABNORMAL LOW (ref 4.22–5.81)
Retic Count, Absolute: 91.3 10*3/uL (ref 19.0–186.0)
Retic Ct Pct: 3.5 % — ABNORMAL HIGH (ref 0.4–3.1)

## 2018-08-21 LAB — ABO/RH: ABO/RH(D): B POS

## 2018-08-21 LAB — HEMOGLOBIN AND HEMATOCRIT, BLOOD
HCT: 22.2 % — ABNORMAL LOW (ref 39.0–52.0)
Hemoglobin: 7 g/dL — ABNORMAL LOW (ref 13.0–17.0)

## 2018-08-21 LAB — LACTATE DEHYDROGENASE: LDH: 155 U/L (ref 98–192)

## 2018-08-21 LAB — TECHNOLOGIST SMEAR REVIEW

## 2018-08-21 LAB — FERRITIN: Ferritin: 8 ng/mL — ABNORMAL LOW (ref 24–336)

## 2018-08-21 LAB — PREPARE RBC (CROSSMATCH)

## 2018-08-21 LAB — VITAMIN B12: Vitamin B-12: 414 pg/mL (ref 180–914)

## 2018-08-21 MED ORDER — ACETAMINOPHEN 650 MG RE SUPP: 650.0000 mg | Freq: Four times a day (QID) | RECTAL | Status: DC | PRN

## 2018-08-21 MED ORDER — OXYCODONE HCL 5 MG PO TABS: 5.0000 mg | ORAL_TABLET | Freq: Three times a day (TID) | ORAL | Status: DC | PRN

## 2018-08-21 MED ORDER — BRIMONIDINE TARTRATE-TIMOLOL 0.2-0.5 % OP SOLN: 1.0000 [drp] | Freq: Two times a day (BID) | OPHTHALMIC | Status: DC

## 2018-08-21 MED ORDER — TIMOLOL MALEATE 0.5 % OP SOLN
1.0000 [drp] | Freq: Two times a day (BID) | OPHTHALMIC | Status: DC
  Administered 2018-08-21 – 2018-08-25 (×8): 1 [drp] via OPHTHALMIC
  Filled 2018-08-21: qty 5

## 2018-08-21 MED ORDER — PANTOPRAZOLE SODIUM 40 MG PO TBEC
40.0000 mg | DELAYED_RELEASE_TABLET | Freq: Two times a day (BID) | ORAL | Status: DC
  Administered 2018-08-21 – 2018-08-25 (×8): 40 mg via ORAL
  Filled 2018-08-21 (×8): qty 1

## 2018-08-21 MED ORDER — GABAPENTIN 300 MG PO CAPS
600.0000 mg | ORAL_CAPSULE | Freq: Three times a day (TID) | ORAL | Status: DC
  Administered 2018-08-21 – 2018-08-25 (×12): 600 mg via ORAL
  Filled 2018-08-21 (×12): qty 2

## 2018-08-21 MED ORDER — METOCLOPRAMIDE HCL 5 MG/ML IJ SOLN
10.0000 mg | Freq: Once | INTRAMUSCULAR | Status: AC
  Administered 2018-08-22: 10 mg via INTRAVENOUS
  Filled 2018-08-21: qty 2

## 2018-08-21 MED ORDER — BRIMONIDINE TARTRATE 0.15 % OP SOLN
1.0000 [drp] | Freq: Two times a day (BID) | OPHTHALMIC | Status: DC
  Administered 2018-08-21 – 2018-08-25 (×8): 1 [drp] via OPHTHALMIC
  Filled 2018-08-21: qty 5

## 2018-08-21 MED ORDER — POLYETHYLENE GLYCOL 3350 17 G PO PACK
17.0000 g | PACK | Freq: Once | ORAL | Status: AC
  Administered 2018-08-21: 17 g via ORAL
  Filled 2018-08-21: qty 1

## 2018-08-21 MED ORDER — PEG-KCL-NACL-NASULF-NA ASC-C 100 G PO SOLR
0.5000 | Freq: Once | ORAL | Status: AC
  Administered 2018-08-22: 100 g via ORAL
  Filled 2018-08-21: qty 1

## 2018-08-21 MED ORDER — SODIUM CHLORIDE 0.9 % IV BOLUS
1000.0000 mL | Freq: Once | INTRAVENOUS | Status: AC
  Administered 2018-08-21: 1000 mL via INTRAVENOUS

## 2018-08-21 MED ORDER — OXYCODONE-ACETAMINOPHEN 10-325 MG PO TABS: 1.0000 | ORAL_TABLET | Freq: Three times a day (TID) | ORAL | Status: DC | PRN

## 2018-08-21 MED ORDER — BISACODYL 5 MG PO TBEC
5.0000 mg | DELAYED_RELEASE_TABLET | Freq: Once | ORAL | Status: AC
  Administered 2018-08-21: 5 mg via ORAL
  Filled 2018-08-21: qty 1

## 2018-08-21 MED ORDER — ACETAMINOPHEN 325 MG PO TABS: 650.0000 mg | ORAL_TABLET | Freq: Four times a day (QID) | ORAL | Status: DC | PRN

## 2018-08-21 MED ORDER — OXYCODONE-ACETAMINOPHEN 5-325 MG PO TABS
1.0000 | ORAL_TABLET | Freq: Three times a day (TID) | ORAL | Status: DC | PRN
  Administered 2018-08-24 – 2018-08-25 (×2): 1 via ORAL
  Filled 2018-08-21 (×2): qty 1

## 2018-08-21 MED ORDER — PEG-KCL-NACL-NASULF-NA ASC-C 100 G PO SOLR: 1.0000 | Freq: Once | ORAL | Status: DC

## 2018-08-21 MED ORDER — SODIUM CHLORIDE 0.9% IV SOLUTION
Freq: Once | INTRAVENOUS | Status: AC
  Administered 2018-08-21: 14:00:00 via INTRAVENOUS

## 2018-08-21 NOTE — Assessment & Plan Note (Addendum)
[name] was evaluated in clinic 2 days ago for dizziness secondary to orthostatic hypotension from dehydration.  HCTZ was stopped and CBC was ordered to check for anemia which showed a hemoglobin of 7.3 (normal baseline).  He presents today for follow-up and continues to feel dizziness on standing that he reports is worsening.  He is having difficulty ambulating due to ongoing dizziness and weakness.  Denies falls at home.  He is on Xarelto for unprovoked RLE DVT diagnosed 2 months ago but denies hematuria, hematochezia, and melena.  Though he admits he does not pay attention to his bowel movements.  No recent illness or changes in medications.  Most recent EGD from 03/2018 did not show any potential sources of bleeding.  He had a colonoscopy in 2015 that showed cecal AVMs, diverticulosis, and internal hemorrhoids.  Order 1 L NS bolus as well as normocytic anemia work-up including stat CBC, reticulocyte count, iron studies, B12, LDH, and blood smear.  Will need admission for symptomatic anemia and possible GI evaluation.

## 2018-08-21 NOTE — H&P (Signed)
Date: 08/21/2018               Patient Name:  [name] MRN: [id number]  DOB: 05/15/1946 Age / Sex: 73 y.o., male   PCP: [name], MD         Medical Service: Internal Medicine Teaching Service         Attending Physician: Dr. Aldine Contes, MD    First Contact: Dr. Sherry Ruffing Pager: [phone]  Second Contact: Dr. Trilby Drummer Pager: [phone]       After Hours (After 5p/  First Contact Pager: [phone]  weekends / holidays): Second Contact Pager: [phone]   Chief Complaint: dizziness and shortness of breath   History of Present Illness:  73 year old man with hypertension, aortic stenosis, osteoarthritis, lumbar stenosis with neurogenic claudication, asteatotic eczema, achalasia requiring botulinum therapy, chronic venous insuffiency, obesity and constipation who presents with shortness of breath and dizziness.  He reports that about two to three weeks ago he began noticing dizziness when he walked and it has progressively worsened up to this point. This week he noticed that is progressed so much so that he had to stop five times to sit and rest while walking to the restroom in his home so he presented to the internal medicine clinic for evaluation. He describes the dizziness being associated with exertional shortness of breath and generalized weakness, he feels "drunk" when walking but is at baseline when he is sitting and resting. He denies chest pain, nausea, palpitations. He denies abdominal pain or heartburn. He notes that he used to have heartburn but this resolved a long time ago when he began taking omeprazole. He notes that about one week ago his stool began to look black and that this was unusual. He also notes that there has been no change in his bowel habits, he has issues with constipation but has continued to have about once daily bowel movement which are difficult to evacuate as always. He denies skin or mucosal bleeding, hematochezia or pain with defecation, diarrhea,  hematemesis or hemoptysis.   He has been taking xarelto since 05/2018 when he presented with an acute unprovoked DVT. 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 He has lumbar stenosis which causes him significant discomfort, this is relatively well managed with tramadol but on bad days he takes one ibuprofen. He notes that the lumbar radicular pain has been causing him a deal of discomfort recently.   He presented to clinic and was found to have a hemoglobin of 7 so he was admit to the internal medicine teaching service. He will receive a 1 L fluid bolus prior to transfer to the floor.   Meds:  Current Meds  Medication Sig  . COMBIGAN 0.2-0.5 % ophthalmic solution Place 1 drop into both eyes 2 (two) times daily.  . cyclopentolate (CYCLODRYL,CYCLOGYL) 1 % ophthalmic solution Place 1 drop into both eyes daily.  [name] gabapentin (NEURONTIN) 300 MG capsule Take 2 capsules (600 mg total) by mouth 3 (three) times daily.  . hydrochlorothiazide (HYDRODIURIL) 25 MG tablet Take 1 tablet (25 mg total) by mouth daily.  . hydrocortisone cream 1 % Apply to itchy area 2 times daily (Patient taking differently: Apply 1 application topically 2 (two) times daily. )  . omeprazole (PRILOSEC) 20 MG capsule Take 20 mg by  mouth daily.  Derrill Memo ON 09/12/2018] oxyCODONE-acetaminophen (PERCOCET) 10-325 MG tablet Take 1 tablet by mouth every 8 (eight) hours as needed for pain.  . Rivaroxaban (XARELTO) 15 MG TABS tablet Take 1 tablet (15 mg total) by mouth 2 (two) times daily with a meal.  . sorbitol 70 % solution Take 15 mLs by mouth daily as needed. (Patient taking differently: Take 15 mLs by mouth daily as needed (for constipation). )  . verapamil (CALAN-SR) 180 MG CR tablet Take 1 tablet (180 mg total) by mouth daily.  . vitamin B-12  (CYANOCOBALAMIN) 1000 MCG tablet Take 1 tablet (1,000 mcg total) by mouth daily.    Allergies: Allergies as of 08/21/2018 - Review Complete 08/21/2018  Allergen Reaction Noted  . Amlodipine Swelling and Other (See Comments) 02/25/2016   Past Medical History:  Diagnosis Date  . 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  Excellent symptomatic response to LES Botox injections.   . Anemia 08/20/2018  . AVM (arteriovenous malformation) of colon 08/26/2013   Cecum X 3, without bleeding on colonoscopy February 2015   . B12 deficiency 10/01/2011   Discovered to 2 progressive neurologic pain and dysfunction.  Diagnosis established March 2013.  Requires the following treatment: B12 IM monthly until level normalizes. After that, oral supplementation.   . Cataract   . Chronic venous insufficiency 11/28/2013  . Constipation due to opioid therapy 03/11/2015  . Deep venous thrombosis ([place]) 05/31/2018   Right lower extremity, unprovoked  . Diverticulosis 08/26/2013  . Essential hypertension 06/15/2006  . Family history of malignant neoplasm of gastrointestinal tract 06/30/2013   Father with colon cancer age 72   . Gastroesophageal reflux disease 02/04/2007  . GI bleeding 08/21/2018  . Internal hemorrhoids 08/26/2013  . Left posterior subcapsular cataract 10/03/2013   s/p yag laser capsulotomy 10/03/2013   . 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  Complicated by chronic low back pain and neuropathy.  Nerve conduction study (10/19/11): Diffusely low motor amplitudes, with primarily involvement of the peroneal and posterior tibial nerves. No evidence of generalized peripheral neuropathy. Findings could be c/w bil multilevel lumbosacral radiculopathies or a primary motor neuropathy.    . Mild aortic valve stenosis 10/09/2006   Echo (12/09/2009): Valve area: 2.03 cm2   . Neuropathy   . Obesity, Class I, BMI 30-34.9 04/05/2012  . Onychomycosis of toenail 06/05/2014  . Osteoarthritis 11/25/2009   Right knee (medial compartment), right hip, left shoulder   . 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 Trigger little finger of right hand 03/11/2015    Family History:  Family History  Problem Relation Age of Onset  . Heart disease Mother   . Colon cancer Father 34  . Kidney disease Brother        on dialysis  . Pancreatic cancer Brother   . Hypertension Sister   . Diabetes Sister   . Kidney disease Brother        on dialysis  . Diabetes Sister   . Colon cancer Brother        2 brothers with colon cancer  . Cancer Daughter 46       Died at age of 61, unknown type  . Anesthesia problems Neg Hx    Social History:  Lives at home on his own, has a home health aid who visits multiple times per week and a brother who does not live in town. Denies alcohol use. Smokes about one pack of cigarettes per week. Denies illicit drug use.   Review of Systems: A complete ROS was negative except as per HPI.  Physical Exam: Blood pressure 131/76, pulse 69, temperature 97.9 F (36.6 C), temperature source Oral, resp. rate 18, SpO2 100 %. General: not in acute distress  HEENT: pale conjunctiva, non icteric sclera  Cardiac: regular rate and rhythm, grade 3/6 systolic murmur loudest over right upper sternal border, no rubs or gallops, no peripheral edema  Pulm: normal work of breathing, lungs clear to auscultation  GI: obese, the abdomen is soft, non tender  Extremities: warm, normal skin color, no peripheral edema  Neuro: alert, oriented, no focal neurologic deficit  Skin: no rashes evident on the exposed skin   Assessment & Plan by Problem: Active Problems:   Acute GI bleeding  Symptomatic anemia  73 year old man with recent  DVT treated with xarelto. He presented to the internal medicine clinic with exertional weakness and shortness of breath for three weeks associated with one week of melanotic stool. He  takes aspirin daily and notes intermittent use of ibuprofen, he smokes cigarettes but denies the use of other NSAIDs or alcohol. Last EGD 03/2018 with abnormal esophageal motility. Last colonoscopy 2015 with cecal AVMs, diverticulosis, and internal hemorrhoids. Ferritin is low at 8 and he has an 1900 mg iron deficit. Presentation is consistent with symptomatic anemia secondary to upper GI bleed.  - transfuse 1 unit PRBC, follow up post transfusion hemoglobin, transfusion threshold should be for any hemoglobin <7   - consider infusion of IV iron tomorrow  - start po protonix 40 mg BID  - Consult GI for ?EGD +/- colonoscopy, we appreciate their recommendations  - hold xarelto, aspirin, NSAIDs, SCDs for DVT prophylaxis   Hypertension  Blood pressure is well controlled today, he already took home meds verapamil and HCTZ this morning.  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  Signed: Ledell Noss, MD 08/21/2018, 5:03 PM  Pager: [phone]

## 2018-08-21 NOTE — Progress Notes (Signed)
   CC: Normocytic anemia   HPI:  [name] is a 73 y.o. year-old male with PMH listed below who presents to clinic for evaluation of normocytic anemia. Please see problem based assessment and plan for further details.   Past Medical History:  Diagnosis Date  . 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  Excellent symptomatic response to LES Botox injections.   . AVM (arteriovenous malformation) of colon 08/26/2013   Cecum X 3, without bleeding on colonoscopy February 2015   . B12 deficiency 10/01/2011   Discovered to 2 progressive neurologic pain and dysfunction.  Diagnosis established March 2013.  Requires the following treatment: B12 IM monthly until level normalizes. After that, oral supplementation.   . Cataract   . Chronic venous insufficiency 11/28/2013  . Constipation due to opioid therapy 03/11/2015  . Deep venous thrombosis ([place]) 05/31/2018   Right lower extremity, unprovoked  . Diverticulosis 08/26/2013  . Essential hypertension 06/15/2006  . Family history of malignant neoplasm of gastrointestinal tract 06/30/2013   Father with colon cancer age 53   . Gastroesophageal reflux disease 02/04/2007  . Internal hemorrhoids 08/26/2013  . Left posterior subcapsular cataract 10/03/2013   s/p yag laser capsulotomy 10/03/2013   . 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  Complicated by chronic low back pain and neuropathy.  Nerve conduction study (10/19/11): Diffusely low motor amplitudes, with primarily involvement of the peroneal and posterior tibial nerves. No evidence of generalized peripheral neuropathy. Findings could be c/w bil multilevel lumbosacral radiculopathies or a primary motor neuropathy.   . Mild aortic valve stenosis 10/09/2006   Echo  (12/09/2009): Valve area: 2.03 cm2   . Neuropathy   . Obesity, Class I, BMI 30-34.9 04/05/2012  . Onychomycosis of toenail 06/05/2014  . Osteoarthritis 11/25/2009   Right knee (medial compartment), right hip, left shoulder   . 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 Trigger little finger of right hand 03/11/2015   Review of Systems:   Review of Systems  Constitutional: Negative for chills, fever and malaise/fatigue.  Respiratory: Positive for shortness of breath.   Cardiovascular: Negative for chest pain.  Neurological: Positive for dizziness and weakness.    Physical Exam: Vitals:   08/21/18 0907  BP: (!) 124/46  Temp: 98 F (36.7 C)  TempSrc: Oral  SpO2: 100%  Weight: 276 lb 4.8 oz (125.3 kg)  Height: 6\' 1"  (1.854 m)   General: tired-appearing male in no acute distress  Eyes: conjunctival pallor  Cardiac: regular rate and rhythm, nl S1/S2, no murmurs, rubs or gallops  Pulm: CTAB, no wheezes or crackles, no increased work of breathing on room air Ext: warm and well perfused, no peripheral edem   Assessment & Plan:   See Encounters Tab for problem based charting.  Patient discussed with Dr. Angelia Mould

## 2018-08-21 NOTE — Consult Note (Signed)
Attending 104 Attestation   I have taken an interval history, reviewed the chart and examined the patient.   Please see separate note from [place] for full details of HPI.  In brief, we are asked to evaluate this patient for development of microcytic anemia of an unclear etiology over the course the last few months.  He describes having darker black stool over the course of the last few weeks with increased fecal urgency he describes no issues of significant dysphagia.  He has not had significant weight loss.  He uses infrequent nonsteroidals.  He has a colonoscopy from 2015 a family history of colon cancer such that he is due this year for a repeat colonoscopy.  At time of his last colonoscopy in 2015 he had cecal AVMs that were not treated.  His most recent upper endoscopy was performed with Botox injection.  I am concerned about his microcytic anemia and I think further diagnostic work-up with an upper and lower endoscopy are reasonable.  We will optimize his stores and he will receive a dose of IV iron in the next 1 to 2 days.  We will allow an Eliquis washout if possible if not IV heparin can be initiated by the primary service as a p.o. necessary.  We will plan an upper and lower endoscopy on Friday and he will begin preparation on Thursday for this.  The risks and benefits of endoscopic evaluation were discussed with the patient; these include but are not limited to the risk of perforation, infection, bleeding, missed lesions, lack of diagnosis, severe illness requiring hospitalization, as well as anesthesia and sedation related illnesses.  The patient is agreeable to proceed.    I agree with the Advanced Practitioner's note, impression, and recommendations with updates and my documentation above.   Justice Britain, MD [facility] Advanced Endoscopy Office # [phone]

## 2018-08-21 NOTE — Progress Notes (Addendum)
IV NS in left forearm reduced to 20cc/hr. per order of Dr. Frederico HammanLaurin Coder.  Patient remains alert and oriented.  Sander Nephew, RN 08/21/2018 12:24 PM. Report called to Donavan Foil, RN onn 5 M.  Patient continues to be alert and oriented.  Transported to 5 M Bed 3 via wheel chair.  IV continues to infuse at 20 cc/hr.  Sander Nephew, RN 08/21/2018 1:25 PM.

## 2018-08-21 NOTE — Consult Note (Cosign Needed Addendum)
[facility] Consult: 2:14 PM 08/21/2018  LOS: 0 days    Referring Provider: Dr Dareen Piano  Primary Care Physician:  [name], MD Primary Gastroenterologist:  Dr. Loletha Carrow     Reason for Consultation:  GIB, melena.     HPI: [name] is a 73 y.o. male.  Lives at an assisted living.  Hx achalasia.  Colonic AVMs. Diverticulosis. botox injection in 2015 and 06/2017, 03/2018.  Other medical problems listed below.   Taking Eliquis for unprovoked DVT 05/2018.     07/2013 colonoscopy.  At least 3 AVMs in the cecum.  These were not bleeding and not obliterated/treated.  Cecal and ascending diverticulosis.  Internal hemorrhoids.  Otherwise normal study.  No polyps.  Given family history repeat colonoscopy in 5 years recommended. 03/2018 EGD with botox injection.  Abnormal esophageal motility.  Dilated mid to lower esophagus.  Abnormal cricopharyngeus.  Spastic distal esophagus/lower esophageal sphincter, endoscope able to pass this area.  Area injected with botulinum toxin.  Stomach and duodenum normal.  Seen at office 2/24 for dizziness.  Hgb of 7.4 noted on note 2/25 (was 13.6 in 2015!).   Admitted this afternoon.  Hgb 7.1.   MCV 89.  Ferritin 8, iron 15.   BUN normal.  No CKD.   No up to date coags.   1 U PRBCs ordered.  Last dose eliquis was this morning.  Does not use PPI or H2 blocker etc.  2 to 3 weeks of dark but not bloody or melenic stool.  He has bowel movements about 2 or 3 times a week.  Sometimes he has the urge to defecate but when he gets to the commode, he is not able to produce any stool.  Appetite is great.  No nausea.  No heartburn.  No weight loss.  No abdominal pain.  Abdomen protuberant but he says that this is not a new issue, that it comes and goes.  Swallowing is good.  He does not feel  like he needs another injection of Botox. In addition to Eliquis he takes baby aspirin every day.  Uses ibuprofen infrequently.  None in the last 7 to 10 days. No other unusual bleeding or bruising.   Fm Hx of colon cancer in dad age 2.    Past Medical History:  Diagnosis Date  . 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  Excellent symptomatic response to LES Botox injections.   . AVM (arteriovenous malformation) of colon 08/26/2013   Cecum X 3, without bleeding on colonoscopy February 2015   . B12 deficiency 10/01/2011   Discovered to 2 progressive neurologic pain and dysfunction.  Diagnosis established March 2013.  Requires the following treatment: B12 IM monthly until level normalizes. After that, oral supplementation.   . Cataract   . Chronic venous insufficiency 11/28/2013  . Constipation due to opioid therapy 03/11/2015  . Deep venous thrombosis ([employer]) 05/31/2018   Right lower extremity, unprovoked  . Diverticulosis 08/26/2013  . Essential hypertension 06/15/2006  . Family history  of malignant neoplasm of gastrointestinal tract 06/30/2013   Father with colon cancer age 64   . Gastroesophageal reflux disease 02/04/2007  . Internal hemorrhoids 08/26/2013  . Left posterior subcapsular cataract 10/03/2013   s/p yag laser capsulotomy 10/03/2013   . 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  Complicated by chronic low back pain and neuropathy.  Nerve conduction study (10/19/11): Diffusely low motor amplitudes, with primarily involvement of the peroneal and posterior tibial nerves. No evidence of generalized peripheral neuropathy. Findings could be c/w bil multilevel lumbosacral radiculopathies or a primary motor neuropathy.   . Mild aortic valve stenosis 10/09/2006    Echo (12/09/2009): Valve area: 2.03 cm2   . Neuropathy   . Obesity, Class I, BMI 30-34.9 04/05/2012  . Onychomycosis of toenail 06/05/2014  . Osteoarthritis 11/25/2009   Right knee (medial compartment), right hip, left shoulder   . 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 Trigger little finger of right hand 03/11/2015    Past Surgical History:  Procedure Laterality Date  . BOTOX INJECTION N/A 09/24/2013   Procedure: BOTOX INJECTION;  Surgeon: Inda Castle, MD;  Location: [facility];  Service: Endoscopy;  Laterality: N/A;  . BOTOX INJECTION N/A 07/17/2017   Procedure: BOTOX INJECTION;  Surgeon: Doran Stabler, MD;  Location: [facility];  Service: Gastroenterology;  Laterality: N/A;  . BOTOX INJECTION N/A 04/15/2018   Procedure: BOTOX INJECTION;  Surgeon: Doran Stabler, MD;  Location: [facility];  Service: Gastroenterology;  Laterality: N/A;  . CAPSULOTOMY Left 10/03/2013   Procedure: YAG CAPSULOTOMY OF LEFT EYE;  Surgeon: Myrtha Mantis., MD;  Location: [place];  Service: Ophthalmology;  Laterality: Left;  . CATARACT EXTRACTION  05/01/2012   left eye  . CATARACT EXTRACTION W/PHACO  11/08/2011   Procedure: CATARACT EXTRACTION PHACO AND INTRAOCULAR LENS PLACEMENT (IOC);  Surgeon: Adonis Brook, MD;  Location: [place];  Service: Ophthalmology;  Laterality: Right;  . CATARACT EXTRACTION W/PHACO  05/01/2012   Procedure: CATARACT EXTRACTION PHACO AND INTRAOCULAR LENS PLACEMENT (IOC);  Surgeon: Adonis Brook, MD;  Location: [place];  Service: Ophthalmology;  Laterality: Left;  . colonscopy    . ESOPHAGEAL MANOMETRY N/A 08/25/2013   Procedure: ESOPHAGEAL MANOMETRY (EM);  Surgeon: Inda Castle, MD;  Location: [facility];  Service: Endoscopy;  Laterality: N/A;  . ESOPHAGOGASTRODUODENOSCOPY N/A 09/24/2013   Procedure: ESOPHAGOGASTRODUODENOSCOPY (EGD);  Surgeon: Inda Castle, MD;  Location: [facility];  Service: Endoscopy;   Laterality: N/A;  . ESOPHAGOGASTRODUODENOSCOPY N/A 02/17/2014   Procedure: ESOPHAGOGASTRODUODENOSCOPY (EGD);  Surgeon: Inda Castle, MD;  Location: [facility];  Service: Endoscopy;  Laterality: N/A;  . ESOPHAGOGASTRODUODENOSCOPY (EGD) WITH PROPOFOL N/A 07/17/2017   Procedure: ESOPHAGOGASTRODUODENOSCOPY (EGD) WITH PROPOFOL;  Surgeon: Doran Stabler, MD;  [facility]: [facility];  Service: Gastroenterology;  Laterality: N/A;  with BOTOX  . ESOPHAGOGASTRODUODENOSCOPY (EGD) WITH PROPOFOL N/A 04/15/2018   Procedure: ESOPHAGOGASTRODUODENOSCOPY (EGD) WITH PROPOFOL;  Surgeon: Doran Stabler, MD;  Location: [facility];  Service: Gastroenterology;  Laterality: N/A;  . EYE SURGERY     cat ext right  . FRACTURE SURGERY     left shoulder  . GAS/FLUID EXCHANGE Right 07/02/2013   Procedure: GAS/FLUID EXCHANGE;  Surgeon: Adonis Brook, MD;  Location: [place];  Service: Ophthalmology;  Laterality: Right;  . PARS PLANA  VITRECTOMY Right 07/02/2013   Procedure: PARS PLANA VITRECTOMY WITH 23 GAUGE WITH MEMBRANE PEEL AND ENDOLASER AND SILICONE OIL;  Surgeon: Adonis Brook, MD;  Location: [place];  Service: Ophthalmology;  Laterality: Right;  . SCLERAL BUCKLE Right 03/15/2013   Procedure: SCLERAL BUCKLE with 23Ga Vit;  Surgeon: Adonis Brook, MD;  Location: [place];  Service: Ophthalmology;  Laterality: Right;    Prior to Admission medications   Medication Sig Start Date End Date Taking? Authorizing Provider  bimatoprost (LUMIGAN) 0.01 % SOLN Place 1 drop into both eyes at bedtime.    [provider]  COMBIGAN 0.2-0.5 % ophthalmic solution Place 1 drop into both eyes 2 (two) times daily. 04/24/16   [provider]  cyclopentolate (CYCLODRYL,CYCLOGYL) 1 % ophthalmic solution Place 1 drop into both eyes daily. 05/21/18   [provider]  gabapentin (NEURONTIN) 300 MG capsule Take 2 capsules (600 mg total) by mouth 3 (three) times daily. 04/19/18   [name], MD  hydrochlorothiazide  (HYDRODIURIL) 25 MG tablet Take 1 tablet (25 mg total) by mouth daily. 12/06/17   [name], MD  hydrocortisone cream 1 % Apply to itchy area 2 times daily 12/17/17 12/17/18  [name], MD  omeprazole (PRILOSEC) 20 MG capsule Take 20 mg by mouth daily.    [provider]  oxyCODONE-acetaminophen (PERCOCET) 10-325 MG tablet Take 1 tablet by mouth every 8 (eight) hours as needed for pain. 09/12/18 11/26/18  [name], MD  Rivaroxaban (XARELTO) 15 MG TABS tablet Take 1 tablet (15 mg total) by mouth 2 (two) times daily with a meal. 06/03/18   [name], MD  rivaroxaban (XARELTO) 20 MG TABS tablet Take 1 tablet (20 mg total) by mouth daily with supper. 06/03/18   [name], MD  Rivaroxaban 15 & 20 MG TBPK Take as directed on package: Start with one 15mg  tablet by mouth twice a day with food. On Day 22, switch to one 20mg  tablet once a day with food. 06/01/18   Davonna Belling, MD  sorbitol 70 % solution Take 15 mLs by mouth daily as needed. Patient taking differently: Take 15 mLs by mouth daily as needed (for constipation).  03/23/17   [name], MD  verapamil (CALAN-SR) 180 MG CR tablet Take 1 tablet (180 mg total) by mouth daily. 02/06/18   [name], MD  vitamin B-12 (CYANOCOBALAMIN) 1000 MCG tablet Take 1 tablet (1,000 mcg total) by mouth daily. 03/06/18   [name], MD    Scheduled Meds: . sodium chloride   Intravenous Once   Infusions:  PRN Meds: acetaminophen **OR** acetaminophen, oxyCODONE-acetaminophen **AND** oxyCODONE   Allergies as of 08/21/2018 - Review Complete 08/21/2018  Allergen Reaction Noted  . Amlodipine Swelling and Other (See Comments) 02/25/2016    Family History  Problem Relation Age of Onset  . Heart disease Mother   . Colon cancer Father 84  . Kidney disease Brother        on dialysis  . Pancreatic cancer Brother   . Hypertension Sister   . Diabetes Sister   . Kidney disease Brother        on dialysis  .  Diabetes Sister   . Colon cancer Brother        2 brothers with colon cancer  . Cancer Daughter 48       Died at age of 95, unknown type  . Anesthesia problems Neg Hx     Social History   Socioeconomic History  . Marital status: Single  Spouse name: Not on file  . Number of children: Not on file  . Years of education: Not on file  . Highest education level: Not on file  Occupational History  . Not on file  Social Needs  . Financial resource strain: Not on file  . Food insecurity:    Worry: Not on file    Inability: Not on file  . Transportation needs:    Medical: Not on file    Non-medical: Not on file  Tobacco Use  . Smoking status: Former Smoker    Packs/day: 0.50    Years: 30.00    Pack years: 15.00    Types: Cigarettes    Last attempt to quit: 11/05/2010    Years since quitting: 7.7  . Smokeless tobacco: Never Used  Substance and Sexual Activity  . Alcohol use: Yes    Alcohol/week: 0.0 standard drinks    Comment: every now and then  . Drug use: No  . Sexual activity: Never    Comment: quit in 2014  Lifestyle  . Physical activity:    Days per week: Not on file    Minutes per session: Not on file  . Stress: Not on file  Relationships  . Social connections:    Talks on phone: Not on file    Gets together: Not on file    Attends religious service: Not on file    Active member of club or organization: Not on file    Attends meetings of clubs or organizations: Not on file    Relationship status: Not on file  . Intimate partner violence:    Fear of current or ex partner: Not on file    Emotionally abused: Not on file    Physically abused: Not on file    Forced sexual activity: Not on file  Other Topics Concern  . Not on file  Social History Narrative  . Not on file    REVIEW OF SYSTEMS: Constitutional: Weak and dizzy. ENT:  No nose bleeds Pulm: No dyspnea.  Cough. CV:  No palpitations, no pain.  Occasional lower extremity edema, currently not an  issue. GU:  No hematuria, no frequency GI: Per HPI. Heme: Per HPI. Transfusions: No recall of previous transfusions Neuro:  No headaches, no peripheral tingling or numbness Derm:  No itching, no rash or sores.  Endocrine:  No sweats or chills.  No polyuria or dysuria Immunization: Flu vaccination in August 2019.  Multiple other vaccinations are up-to-date. Travel:  None beyond local counties in last few months.    PHYSICAL EXAM: Vital signs in last 24 hours: Vitals:   08/21/18 1355  BP: 133/71  Pulse: 66  Resp: 18  Temp: 97.8 F (36.6 C)  SpO2: 100%   Wt Readings from Last 3 Encounters:  08/21/18 125.3 kg  08/19/18 124.2 kg  06/14/18 126.3 kg    General: Obese, pleasant, non-acutely ill-appearing.  His speech is very difficult to understand Head: No facial asymmetry or swelling.  No signs of head trauma. Eyes: No scleral icterus.  No conjunctival pallor. Ears: Slightly HOH. Nose: No congestion or discharge. Mouth: Moist, clear, pink oral mucosa.  Poor dentition.  Tongue midline. Neck: No JVD, no masses, no thyromegaly. Lungs: Diminished but clear.  No labored breathing or cough. Heart: RRR.  No MRG.  S1, S2 present Abdomen: Large, protuberant, slightly tense.  Not tender.  No HSM, masses, bruits, hernias appreciated..   Rectal: Soft, dark brown stool. 3+ FOBT +.  No palpable  masses. Musc/Skeltl: No joint redness, swelling or significant deformity. Extremities: 1+, slight pedal/ankle edema bilaterally. Neurologic: Alert.  Oriented x3.  Is all 4 limbs without tremor, strength not tested. Skin: No rash or sores.  Loss of pigmentation in a patchy pattern on right shin. Tattoos: None observed Nodes: No cervical adenopathy. Psych: Cooperative, pleasant, fluid speech.  Intake/Output from previous day: No intake/output data recorded. Intake/Output this shift: No intake/output data recorded.  LAB RESULTS: Recent Labs    08/19/18 1444 08/21/18 0910  WBC 8.4 6.7  HGB  7.4* 7.1*  HCT 23.5* 23.2*  PLT 318 307   BMET Lab Results  Component Value Date   NA 139 08/19/2018   NA 143 03/23/2017   NA 145 (H) 08/25/2016   K 3.7 08/19/2018   K 4.3 03/23/2017   K 4.2 08/25/2016   CL 102 08/19/2018   CL 103 03/23/2017   CL 101 08/25/2016   CO2 24 08/19/2018   CO2 26 03/23/2017   CO2 18 08/25/2016   GLUCOSE 88 08/19/2018   GLUCOSE 69 03/23/2017   GLUCOSE 86 08/25/2016   BUN 19 08/19/2018   BUN 15 03/23/2017   BUN 8 08/25/2016   CREATININE 1.30 (H) 08/19/2018   CREATININE 1.20 03/23/2017   CREATININE 1.03 08/25/2016   CALCIUM 9.6 08/19/2018   CALCIUM 9.8 03/23/2017   CALCIUM 9.8 08/25/2016   LFT No results for input(s): PROT, ALBUMIN, AST, ALT, ALKPHOS, BILITOT, BILIDIR, IBILI in the last 72 hours. PT/INR Lab Results  Component Value Date   INR 1.0 12/06/2007   Hepatitis Panel No results for input(s): HEPBSAG, HCVAB, HEPAIGM, HEPBIGM in the last 72 hours. C-Diff No components found for: CDIFF Lipase     Component Value Date/Time   LIPASE 44 11/13/2010 1803    Drugs of Abuse     Component Value Date/Time   LABOPIA NEG 01/19/2015 1015   LABOPIA POSITIVE (A) 11/13/2010 2038   COCAINSCRNUR NEG 01/19/2015 1015   LABBENZ NEG 01/19/2015 1015   LABBENZ NEG 04/17/2011 1500   AMPHETMU NEG 01/19/2015 1015   AMPHETMU NONE DETECTED 11/13/2010 2038   THCU NEG 01/19/2015 1015   THCU NONE DETECTED 11/13/2010 2038   LABBARB NEG 01/19/2015 1015   LABBARB  11/13/2010 2038    NONE DETECTED        DRUG SCREEN FOR MEDICAL PURPOSES ONLY.  IF CONFIRMATION IS NEEDED FOR ANY PURPOSE, NOTIFY LAB WITHIN 5 DAYS.        LOWEST DETECTABLE LIMITS FOR URINE DRUG SCREEN Drug Class       Cutoff (ng/mL) Amphetamine      1000 Barbiturate      200 Benzodiazepine   578 Tricyclics       469 Opiates          300 Cocaine          300 THC              50     RADIOLOGY STUDIES: No results found.    IMPRESSION:   *    GIB, suspect this is from colonic  AVMs that were noted but nonbleeding on colonoscopy in 2015.  Based on EGD from 03/2018, where no mucosal injury noted, low suspicion for ulcer disease, gastritis etc.  *   Anemia  *    Chronic Eliquis since DVT dx 05/2018.  Last dose of this med taken this morning 08/21/2018.    PLAN:     *    Colonoscopy and EGD on 12/28 ~  0730.  This will allow for 2 days of Eliquis washout.  And also provide a full 24 hours for clear diet and bowel prep. Because of his constipation I am giving him a dose of Dulcolax and MiraLAX now.  Begin clear liquids now.  *   Hold Eliquis as doing.  Complete ordered PRBC x1.   CBC in the morning.   Azucena Freed  08/21/2018, 2:14 PM Phone [phone]

## 2018-08-21 NOTE — Progress Notes (Signed)
Internal Medicine Clinic Attending  Case discussed with Dr. Santos-Sanchez at the time of the visit.  We reviewed the resident's history and exam and pertinent patient test results.  I agree with the assessment, diagnosis, and plan of care documented in the resident's note.    

## 2018-08-21 NOTE — Progress Notes (Signed)
New Admission Note:   Arrival Method: Wheelchair  Mental Orientation: Alert and Oriented  Telemetry: none  Assessment: Completed Skin: intact dry IV: Left forearm  Pain: 0/10  Tubes: Safety Measures: Safety Fall Prevention Plan has been given, discussed and signed Admission: Completed [facility] Orientation: Patient has been orientated to the room, unit and staff.  Family: none   Orders have been reviewed and implemented. Will continue to monitor the patient. Call light has been placed within reach and bed alarm has been activated.   Sanita Estrada RN [facility] Phone: [phone]

## 2018-08-21 NOTE — Progress Notes (Signed)
Internal Medicine Clinic Attending  Case discussed with Dr. Isac Sarna at the time of the visit.  We reviewed the resident's history and exam and pertinent patient test results.  I agree with the assessment, diagnosis, and plan of care documented in the resident's note.   Symptomatic anemia, most likely secondary to iron deficiency, will admit observation, plan transfusion, consider dose of IV iron.  Will need repeat GI eval, may be done as outpatient if no active bleeding noted.

## 2018-08-22 DIAGNOSIS — G8929 Other chronic pain: Secondary | ICD-10-CM

## 2018-08-22 DIAGNOSIS — E669 Obesity, unspecified: Secondary | ICD-10-CM

## 2018-08-22 DIAGNOSIS — I350 Nonrheumatic aortic (valve) stenosis: Secondary | ICD-10-CM

## 2018-08-22 DIAGNOSIS — Z86718 Personal history of other venous thrombosis and embolism: Secondary | ICD-10-CM

## 2018-08-22 DIAGNOSIS — Z7901 Long term (current) use of anticoagulants: Secondary | ICD-10-CM

## 2018-08-22 DIAGNOSIS — Z9889 Other specified postprocedural states: Secondary | ICD-10-CM

## 2018-08-22 DIAGNOSIS — K220 Achalasia of cardia: Secondary | ICD-10-CM

## 2018-08-22 DIAGNOSIS — K5900 Constipation, unspecified: Secondary | ICD-10-CM

## 2018-08-22 DIAGNOSIS — R011 Cardiac murmur, unspecified: Secondary | ICD-10-CM

## 2018-08-22 DIAGNOSIS — L308 Other specified dermatitis: Secondary | ICD-10-CM

## 2018-08-22 DIAGNOSIS — Z7982 Long term (current) use of aspirin: Secondary | ICD-10-CM

## 2018-08-22 DIAGNOSIS — I872 Venous insufficiency (chronic) (peripheral): Secondary | ICD-10-CM

## 2018-08-22 DIAGNOSIS — M48062 Spinal stenosis, lumbar region with neurogenic claudication: Secondary | ICD-10-CM

## 2018-08-22 DIAGNOSIS — I10 Essential (primary) hypertension: Secondary | ICD-10-CM

## 2018-08-22 DIAGNOSIS — M1990 Unspecified osteoarthritis, unspecified site: Secondary | ICD-10-CM

## 2018-08-22 DIAGNOSIS — D509 Iron deficiency anemia, unspecified: Secondary | ICD-10-CM

## 2018-08-22 DIAGNOSIS — Z79899 Other long term (current) drug therapy: Secondary | ICD-10-CM

## 2018-08-22 DIAGNOSIS — H409 Unspecified glaucoma: Secondary | ICD-10-CM

## 2018-08-22 DIAGNOSIS — Z79891 Long term (current) use of opiate analgesic: Secondary | ICD-10-CM

## 2018-08-22 DIAGNOSIS — Z791 Long term (current) use of non-steroidal anti-inflammatories (NSAID): Secondary | ICD-10-CM

## 2018-08-22 DIAGNOSIS — M545 Low back pain: Secondary | ICD-10-CM

## 2018-08-22 LAB — CBC
HCT: 23.5 % — ABNORMAL LOW (ref 39.0–52.0)
HEMOGLOBIN: 7.3 g/dL — AB (ref 13.0–17.0)
MCH: 27.4 pg (ref 26.0–34.0)
MCHC: 31.1 g/dL (ref 30.0–36.0)
MCV: 88.3 fL (ref 80.0–100.0)
Platelets: 289 10*3/uL (ref 150–400)
RBC: 2.66 MIL/uL — ABNORMAL LOW (ref 4.22–5.81)
RDW: 16.5 % — ABNORMAL HIGH (ref 11.5–15.5)
WBC: 6.2 10*3/uL (ref 4.0–10.5)
nRBC: 0.6 % — ABNORMAL HIGH (ref 0.0–0.2)

## 2018-08-22 LAB — BASIC METABOLIC PANEL
ANION GAP: 6 (ref 5–15)
BUN: 19 mg/dL (ref 8–23)
CO2: 27 mmol/L (ref 22–32)
Calcium: 9.1 mg/dL (ref 8.9–10.3)
Chloride: 105 mmol/L (ref 98–111)
Creatinine, Ser: 1.25 mg/dL — ABNORMAL HIGH (ref 0.61–1.24)
GFR calc non Af Amer: 56 mL/min — ABNORMAL LOW (ref >60)
Glucose, Bld: 85 mg/dL (ref 70–99)
Potassium: 3.8 mmol/L (ref 3.5–5.1)
Sodium: 138 mmol/L (ref 135–145)

## 2018-08-22 LAB — MRSA PCR SCREENING: MRSA by PCR: NEGATIVE

## 2018-08-22 LAB — PREPARE RBC (CROSSMATCH)

## 2018-08-22 LAB — PATHOLOGIST SMEAR REVIEW

## 2018-08-22 MED ORDER — SODIUM CHLORIDE 0.9 % IV SOLN
510.0000 mg | Freq: Once | INTRAVENOUS | Status: AC
  Administered 2018-08-22: 510 mg via INTRAVENOUS
  Filled 2018-08-22: qty 17

## 2018-08-22 MED ORDER — BISACODYL 5 MG PO TBEC
20.0000 mg | DELAYED_RELEASE_TABLET | Freq: Once | ORAL | Status: AC
  Administered 2018-08-22: 20 mg via ORAL
  Filled 2018-08-22: qty 4

## 2018-08-22 MED ORDER — POLYETHYLENE GLYCOL 3350 17 GM/SCOOP PO POWD
1.0000 | Freq: Once | ORAL | Status: AC
  Administered 2018-08-22: 255 g via ORAL
  Filled 2018-08-22: qty 255

## 2018-08-22 MED ORDER — SODIUM CHLORIDE 0.9% IV SOLUTION: Freq: Once | INTRAVENOUS | Status: DC

## 2018-08-22 NOTE — Progress Notes (Signed)
          Daily Rounding Note  08/22/2018, 11:03 AM  LOS: 1 day   SUBJECTIVE:   Chief complaint: Anemia.  Dark, FOBT positive stool.  History nonbleeding colonic AVMs  Feels fine because he has been sitting in bed since admission.  Not sure how he would feel if he got up and try to move around. Is been a few days since he had bowel movements.  This despite the fact that he received Dulcolax lax yesterday afternoon   OBJECTIVE:         Vital signs in last 24 hours:    Temp:  [97.7 F (36.5 C)-98.2 F (36.8 C)] 98.2 F (36.8 C) (02/27 0953) Pulse Rate:  [66-73] 69 (02/27 0953) Resp:  [18-20] 20 (02/27 0953) BP: (117-144)/(59-76) 117/76 (02/27 0953) SpO2:  [98 %-100 %] 100 % (02/27 0953) Weight:  [125.3 kg] 125.3 kg (02/26 1135) Last BM Date: 08/20/18 There were no vitals filed for this visit. General: Generally looks in poor health.  Comfortable, alert.  Very difficult to understand what he says. Heart: RRR. Chest: Diminished breath sounds but clear.  No wet vocal quality.  No dyspnea. Abdomen: Soft.  Obese.  Active bowel sounds.  Not tender. Extremities: No CCE. Neuro/Psych: Alert.  Appropriate.  Moves all 4 limbs.  Intake/Output from previous day: 02/26 0701 - 02/27 0700 In: 578.2 [P.O.:240; I.V.:32.2; Blood:306] Out: 650 [Urine:650]  Intake/Output this shift: Total I/O In: 240 [P.O.:240] Out: 375 [Urine:375]  Lab Results: Recent Labs    08/19/18 1444 08/21/18 0910 08/21/18 2030 08/22/18 0414  WBC 8.4 6.7  --  6.2  HGB 7.4* 7.1* 7.0* 7.3*  HCT 23.5* 23.2* 22.2* 23.5*  PLT 318 307  --  289   BMET Recent Labs    08/19/18 1444 08/22/18 0414  NA 139 138  K 3.7 3.8  CL 102 105  CO2 24 27  GLUCOSE 88 85  BUN 19 19  CREATININE 1.30* 1.25*  CALCIUM 9.6 9.1     ASSESMENT:   *    GIB,  dark, FOBT + stool.  suspect this is from colonic AVMs that were noted but nonbleeding on colonoscopy in 2015.  Based on  EGD from 03/2018, where no mucosal injury noted, low suspicion for ulcer disease, gastritis etc.  *   Anemia.  Hgb essentially stable within 0.5 gm.  Iron deficient with low ferritin and low iron.  1 U PRBCs yesterday.    *    Constipation.  Had not had any results from a dose of MiraLAX and Dulcolax yesterday.  Just he is going to need a more aggressive approach to bowel prep.  *    Chronic Eliquis since DVT dx 05/2018.  Last dose of this med taken this morning 08/21/2018.   *   Mild AKI, improved.     PLAN   *    EGD and colonoscopy tomorrow.    *   Additional 1 U RBC ordered this morning.  This is not been started yet.  *     CBC  *   Has orders for Dulcolax, movie prep starting later this afternoon/early evening but I am going to begin with MiraLAX/Gatorade now.       Azucena Freed  08/22/2018, 11:03 AM Phone [phone]

## 2018-08-22 NOTE — Discharge Summary (Signed)
Name: [name] MRN: [id number] DOB: April 08, 1946 73 y.o. PCP: [name], MD  Date of Admission: 08/21/2018  1:39 PM Date of Discharge: 08/25/2018 Attending Physician: [name]   Discharge Diagnosis: 1. Symptomatic anemia 2/2 angiodysplasia  2. Hypertension 3. Chronic lower back pain 4. Glaucoma  Discharge Medications: Allergies as of 08/25/2018      Reactions   Amlodipine Swelling, Other (See Comments)   Bilateral lower extremity swelling      Medication List    STOP taking these medications   omeprazole 20 MG capsule Commonly known as:  PRILOSEC     TAKE these medications   COMBIGAN 0.2-0.5 % ophthalmic solution Generic drug:  brimonidine-timolol Place 1 drop into both eyes 2 (two) times daily.   cyclopentolate 1 % ophthalmic solution Commonly known as:  CYCLODRYL,CYCLOGYL Place 1 drop into both eyes daily.   gabapentin 300 MG capsule Commonly known as:  NEURONTIN Take 2 capsules (600 mg total) by mouth 3 (three) times daily.   hydrochlorothiazide 25 MG tablet Commonly known as:  HYDRODIURIL Take 1 tablet (25 mg total) by mouth daily.   hydrocortisone cream 1 % Apply to itchy area 2 times daily What changed:    how much to take  how to take this  when to take this  additional instructions   Iron 325 (65 Fe) MG Tabs Take 1 tablet (325 mg total) by mouth daily.   oxyCODONE-acetaminophen 10-325 MG tablet Commonly known as:  PERCOCET Take 1 tablet by mouth every 8 (eight) hours as needed for pain. Start taking on:  September 12, 2018   pantoprazole 40 MG tablet Commonly known as:  PROTONIX Take 1 tablet (40 mg total) by mouth 2 (two) times daily.   Rivaroxaban 15 MG Tabs tablet Commonly known as:  XARELTO Take 1 tablet (15 mg total) by mouth 2 (two) times daily with a meal for 21 days. What changed:  Another medication with the same name was removed. Continue taking this medication, and follow the directions you see here.   sorbitol  70 % solution Take 15 mLs by mouth daily as needed. What changed:  reasons to take this   verapamil 180 MG CR tablet Commonly known as:  CALAN-SR Take 1 tablet (180 mg total) by mouth daily.   vitamin B-12 1000 MCG tablet Commonly known as:  CYANOCOBALAMIN Take 1 tablet (1,000 mcg total) by mouth daily.       Disposition and follow-up:   ShawnIna D [name] was discharged from [facility] in Stable condition.  At the hospital follow up visit please address:  1.  Symptomatic anemia: EGD and coloscopy showed multiple non-bleeding angiectasias s/p argon plasma coagulation. He should how Xaralto until 3/3, he is on it for DVT prophylaxis however given his risk of bleeding it was recommended to hold for 3 days before restarting it. Please assess for recurrent bleeding. He should have follow up with GI to repeat colonoscopy.   Recent DVT treated with xarelto- It seems that [name] did not complete his initial Xarelto therapy 2/2 DVT so he was restarted on xarelto 15 mg bid for 21 days. Please make sure he is compliant and is started on xarelto 20 mg daily once he completes 15 mg bid for 21 days.  2.  Labs / imaging needed at time of follow-up: CBC, BMP   3.  Pending labs/ test needing follow-up: None  Follow-up Appointments: Follow-up Information    [name], MD. Schedule an  appointment as soon as possible for a visit in 1 week(s).   Specialty:  Internal Medicine Contact information: [address] [place] [address] [phone]        [facility], [facility]-Home Follow up.   Specialty:  [place] Why:  for home health services Contact information: [place] [address] [phone]           Hospital Course by problem list:  1.  Symptomatic anemia 2/2 angiodysplasia: This is a 73 year old male with hypertension, aortic stenosis, OA, lumbar stenosis with neurogenic claudication, asteatotic eczema,  achalasia requiring botulinum therapy, chronic venous insufficiency, obesity and constipation who presented with shortness of breath and dizzinessfor 3 weeks.He reported black stools for about 1 week. 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 He is noted to be anemic down to 7 in the clinic and he received 1L fluid bolus and admitted to the floor.  He was transfused 1 unit and hemoglobin increased to 7.3 from 7.  He is still symptomatic and is transfused another unit the next day.  Iron studies showed findings consistent with iron deficiency anemia and he was given IV iron.  GI was consulted and schedule an EGD and colonoscopy.  EGD and colonoscopy showed multiple areas of nonbleeding angiectasia's, APC was used on these.  Colonoscopy showed a poor prep and we were unable to completely visualize the whole colon, he will need to do a repeat colonoscopy.  He was put on Protonix twice daily.  Hemoglobin remained stable at 7.7 on discharge.  GI recommended holding Xarelto for 3 more days on discharge.  We discussed with patient the risk and the benefits of holding Xarelto given his history of unprovoked DVT, we decided that risk of bleeding is more worrisome than the risk of a recurrent clot. He was discharged to restart the initial DVT treatment - 15 mg twice a day for 21 days. He will need to continue on 20 mg once daily after he completes the 15 mg bid for 21 days.   2. Hypertension: He is on verapamil and HCTZ at home, blood pressures had been on the lower side and these were held. They were consistently <130/70 so blood pressure medications were held during hospital stay and resumed at discharge.  3. Chronic lower back pain: Continued on home gabapentin 600 mg TID and oxycodone- acetaminophen 10-325 mg q8h PRN.  4. Glaucoma: Continued on home Combigan    Discharge Vitals:   BP (!) 125/59 (BP Location: Right Arm)   Pulse 69   Temp 98.1 F  (36.7 C) (Oral)   Resp 18   Ht 6\' 1"  (1.854 m)   Wt 126.7 kg   SpO2 99%   BMI 36.85 kg/m   Pertinent Labs, Studies, and Procedures:   CBC Latest Ref Rng & Units 08/25/2018 08/24/2018 08/23/2018  WBC 4.0 - 10.5 K/uL 9.7 11.8(H) 7.6  Hemoglobin 13.0 - 17.0 g/dL 7.7(L) 8.5(L) 7.7(L)  Hematocrit 39.0 - 52.0 % 24.9(L) 27.4(L) 24.8(L)  Platelets 150 - 400 K/uL 250 261 273   Iron/TIBC/Ferritin/ %Sat    Component Value Date/Time   IRON 15 (L) 08/21/2018 0910   TIBC 536 (H) 08/21/2018 0910   FERRITIN 8 (L) 08/21/2018 0910   IRONPCTSAT 3 (L) 08/21/2018 0910   08/23/18 Small bowel endoscopy: - Dilation in the distal esophagus. Hypertonic lower esophageal sphincter. No other gross lesions in esophagus. - Two non-bleeding  angioectasias in the stomach. Treated with argon plasma coagulation (APC). No other gross lesions in the stomach. - A single non-bleeding angioectasia in the duodenum. Treated with argon plasma coagulation (APC). No other gross lesions in the entire examined duodenum. - Two non-bleeding angioectasias in the jejunum. Treated with argon plasma coagulation (APC). Normal mucosa was found in the proximal jejunum with distal extent of SBE. - No specimens collected.  08/23/18 Colonoscopy: - Preparation of the colon was inadequate. - Hemorrhoids found on perianal exam. - Stool in the entire examined colon. - Five colonic angioectasias. Treated with argon plasma coagulation (APC). Clips (MR conditional) were placed. - One 4 mm polyp in the sigmoid colon, removed with a cold snare. Resected and retrieved. Clip (MR conditional) was placed. - Diverticulosis in the sigmoid colon and in the descending colon. - Non-bleeding non-thrombosed internal hemorrhoids.  Discharge Instructions: Discharge Instructions    Call MD for:  difficulty breathing, headache or visual disturbances   Complete by:  As directed    Call MD for:  extreme fatigue   Complete by:  As directed    Call MD for:   hives   Complete by:  As directed    Call MD for:  persistant dizziness or light-headedness   Complete by:  As directed    Call MD for:  persistant nausea and vomiting   Complete by:  As directed    Call MD for:  redness, tenderness, or signs of infection (pain, swelling, redness, odor or green/yellow discharge around incision site)   Complete by:  As directed    Call MD for:  severe uncontrolled pain   Complete by:  As directed    Call MD for:  temperature >100.4   Complete by:  As directed    Diet - low sodium heart healthy   Complete by:  As directed    Diet - low sodium heart healthy   Complete by:  As directed    Discharge instructions   Complete by:  As directed    Mr. Ishaan, [place] were hospitalized due to an upper GI bleed.  Please note the following changes to your medications: -Start taking Xarelto 15 mg twice a day for 21 days starting on Tuesday, 08/27/18 -Start taking Iron supplements daily. I want you to take 325 mg iron tablets once a day -Start taking Protonix 40 mg twice a day  I want you to follow up with the [facility] clinic within one week. I also want you to make sure to follow up with the GI doctors for an outpatient colonoscopy.   Thank you for allowing [place] to be a part of your care!   Increase activity slowly   Complete by:  As directed    Increase activity slowly   Complete by:  As directed       Signed: Auria Mckinlay N, DO 08/27/2018, 1:55 PM

## 2018-08-22 NOTE — H&P (View-Only) (Signed)
          Daily Rounding Note  08/22/2018, 11:03 AM  LOS: 1 day   SUBJECTIVE:   Chief complaint: Anemia.  Dark, FOBT positive stool.  History nonbleeding colonic AVMs  Feels fine because he has been sitting in bed since admission.  Not sure how he would feel if he got up and try to move around. Is been a few days since he had bowel movements.  This despite the fact that he received Dulcolax lax yesterday afternoon   OBJECTIVE:         Vital signs in last 24 hours:    Temp:  [97.7 F (36.5 C)-98.2 F (36.8 C)] 98.2 F (36.8 C) (02/27 0953) Pulse Rate:  [66-73] 69 (02/27 0953) Resp:  [18-20] 20 (02/27 0953) BP: (117-144)/(59-76) 117/76 (02/27 0953) SpO2:  [98 %-100 %] 100 % (02/27 0953) Weight:  [125.3 kg] 125.3 kg (02/26 1135) Last BM Date: 08/20/18 There were no vitals filed for this visit. General: Generally looks in poor health.  Comfortable, alert.  Very difficult to understand what he says. Heart: RRR. Chest: Diminished breath sounds but clear.  No wet vocal quality.  No dyspnea. Abdomen: Soft.  Obese.  Active bowel sounds.  Not tender. Extremities: No CCE. Neuro/Psych: Alert.  Appropriate.  Moves all 4 limbs.  Intake/Output from previous day: 02/26 0701 - 02/27 0700 In: 578.2 [P.O.:240; I.V.:32.2; Blood:306] Out: 650 [Urine:650]  Intake/Output this shift: Total I/O In: 240 [P.O.:240] Out: 375 [Urine:375]  Lab Results: Recent Labs    08/19/18 1444 08/21/18 0910 08/21/18 2030 08/22/18 0414  WBC 8.4 6.7  --  6.2  HGB 7.4* 7.1* 7.0* 7.3*  HCT 23.5* 23.2* 22.2* 23.5*  PLT 318 307  --  289   BMET Recent Labs    08/19/18 1444 08/22/18 0414  NA 139 138  K 3.7 3.8  CL 102 105  CO2 24 27  GLUCOSE 88 85  BUN 19 19  CREATININE 1.30* 1.25*  CALCIUM 9.6 9.1     ASSESMENT:   *    GIB,  dark, FOBT + stool.  suspect this is from colonic AVMs that were noted but nonbleeding on colonoscopy in 2015.  Based on  EGD from 03/2018, where no mucosal injury noted, low suspicion for ulcer disease, gastritis etc.  *   Anemia.  Hgb essentially stable within 0.5 gm.  Iron deficient with low ferritin and low iron.  1 U PRBCs yesterday.    *    Constipation.  Had not had any results from a dose of MiraLAX and Dulcolax yesterday.  Just he is going to need a more aggressive approach to bowel prep.  *    Chronic Eliquis since DVT dx 05/2018.  Last dose of this med taken this morning 08/21/2018.   *   Mild AKI, improved.     PLAN   *    EGD and colonoscopy tomorrow.    *   Additional 1 U RBC ordered this morning.  This is not been started yet.  *     CBC  *   Has orders for Dulcolax, movie prep starting later this afternoon/early evening but I am going to begin with MiraLAX/Gatorade now.       Azucena Freed  08/22/2018, 11:03 AM Phone [phone]

## 2018-08-22 NOTE — Progress Notes (Signed)
  Date: 08/22/2018  Patient name: [name]  Medical record number: [id number]  Date of birth: [birth year]   I have seen and evaluated [name] and discussed their care with the Residency Team.  In brief, patient is a 73 year old male with a past medical history of hypertension, aortic stenosis, osteoarthritis, lumbar stenosis with neurogenic claudication, achalasia requiring bloodletting therapy, chronic venous insufficiency, obesity and constipation who presented to the internal medicine clinic with worsening shortness of breath and lightheadedness over the last couple of weeks.  Patient states that approximately 2 to 3 weeks ago he noted lightheadedness and dyspnea on exertion especially on ambulation.  Patient states that he was walking as if he was "drunk".  His symptoms progressively worsened over the course of the last couple weeks and now he gets dyspneic and lightheaded with minimal exertion.  No chest pain, no palpitations, no syncope, no nausea or vomiting, no diarrhea, no abdominal pain, no headache, no blurry vision, no focal weakness, no tingling or numbness.  Patient does note that approximately 1 week ago his stools began to look black.  He usually has constipation but has been having at least one bowel movement daily over the last week.  Today patient states that he feels well lying in bed but has not gotten up and ambulated yet.  He did state that he felt lightheaded when he sat up.  PMHx, Fam Hx, and/or Soc Hx : As per resident admit note  Vitals:   08/22/18 1302 08/22/18 1325  BP: (!) 90/37 123/76  Pulse: 66 65  Resp: 14 12  Temp: 97.6 F (36.4 C) 97.8 F (36.6 C)  SpO2: 100% 100%   General: Awake, alert, oriented x3, NAD CVS: Regular rate and rhythm, systolic murmur grade 3 out of 6 noted Lungs: CTA bilaterally Abdomen: Soft, obese, nontender, normoactive bowel sounds Extremities: No edema noted  Assessment and Plan: I have seen and evaluated the patient  as outlined above. I agree with the formulated Assessment and Plan as detailed in the residents' note, with the following changes:   1.  Symptomatic iron deficiency anemia: -Patient presented to the internal medicine clinic with lightheadedness and shortness of breath on ambulation over the last couple of weeks which has progressively worsened and he is now dyspneic and lightheaded on minimal exertion with new iron deficiency anemia in the setting of being on anticoagulation for DVT.  His symptoms are likely secondary to his worsening anemia which is likely due to a GI bleed. -Patient's presentation is consistent with an upper GI bleed -Patient's hemoglobin only increased to 7.3 from 7 on admission after 1 unit PRBC.  Patient still with lightheadedness on sitting up. -We will transfuse another unit PRBC today. -Iron studies consistent with severe iron deficiency anemia.  Will give IV iron as well. -GI follow-up and recommendations appreciated.  Patient scheduled for an EGD and colonoscopy tomorrow -Continue with p.o. Protonix twice daily -We will hold Xarelto for now -Continue with SCDs for DVT prophylaxis. -No further work-up at this time  Aldine Contes, MD 2/27/20201:50 PM

## 2018-08-22 NOTE — Progress Notes (Signed)
   Subjective: [name] was doing okay today, no acute events overnight. He reports that he has not got up yet since he has been here and does not know if he is still feeling dizzy, when he sat up he did report that he was getting a little dizzy. He has not had a bowel movement this morning, denied any nausea, vomiting, or abdominal pain. He denied any new complaints today. We discussed the plan for today and he is in agreement.     Objective:  Vital signs in last 24 hours: Vitals:   08/21/18 1605 08/21/18 1817 08/21/18 2038 08/22/18 0440  BP: 131/76 (!) 143/73 (!) 144/68 129/76  Pulse: 69 73 70 66  Resp: 18 18 18 20   Temp: 97.9 F (36.6 C) 98.2 F (36.8 C) 97.7 F (36.5 C) 98 F (36.7 C)  TempSrc: Oral Oral Oral Oral  SpO2: 100% 100% 100% 100%    General: Tired appearing male, NAD Cardiac: Systolic murmur 3/6 heard best at right 2nd intercostal space, RRR Pulmonary: CTABL, no wheezing or rhonchi Abdomen: Obese, soft, non tender, no guarding Extremity: Trace BL LE edema  Assessment/Plan:  Active Problems:   Acute GI bleeding  This is a 73 year old male with hypertension, aortic stenosis, OA, lumbar stenosis with neurogenic claudication, asteatotic eczema, achalasia requiring botulinum therapy, chronic venous insufficiency, obesity and constipation who presented with shortness of breath and dizziness for 3 weeks. He reported black stools for about 1 week. 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   Symptomatic normocytic anemia -He has a history of AVMs that were untreated on colonoscopy in 2015. He was still symptomatic this morning with dizziness when he sat up, he was transfused 1 unit pRBC last night, Hgb went from 7.0 > 7.3. FOBT+.  Iron panel showed ferritin 8, iron 15, TIBC 536.  B12 was normal.  LDH is 155, normal.  Given his dark stools this is likely a upper GI bleed.  -Transfuse 1 more unit today -F/u  post transfusion H+H -GI following, plan for EGD and colonoscopy tomorrow -NPO at midnight -IV iron today  -Continue p.o. Protonix 40 mg twice daily -Holding Xarelto, aspirin and NSAIDs -SCDs for DVT prophylaxis  Hypertension: -Blood pressures have been stable, did have a mild hypertensive up to 144/68 but now is normal.  He is on verapamil and HCTZ at home. -Continue to hold home medications and resume as needed  Chronic lower back pain due to spinal stenosis: - continue home gabapentin 600 mg TID and oxycodone- acetaminophen 10-325 mg q8h  Glaucoma: -Continue home Combigan  FEN: No fluids, replete lytes prn, CLD diet, NPO at [facility] VTE ppx: SCDs Code Status: FULL    Dispo: Anticipated discharge is pending clinical improvement.   [name], MD 08/22/2018, 6:41 AM Pager: [phone]

## 2018-08-23 ENCOUNTER — Inpatient Hospital Stay (HOSPITAL_COMMUNITY): Payer: Medicare Other | Admitting: Certified Registered Nurse Anesthetist

## 2018-08-23 ENCOUNTER — Encounter (HOSPITAL_COMMUNITY): Payer: Self-pay | Admitting: *Deleted

## 2018-08-23 ENCOUNTER — Encounter (HOSPITAL_COMMUNITY)
Admission: AD | Disposition: A | Discharge status: Home Health | Payer: Self-pay | Source: Ambulatory Visit | Attending: Internal Medicine

## 2018-08-23 DIAGNOSIS — D125 Benign neoplasm of sigmoid colon: Secondary | ICD-10-CM

## 2018-08-23 DIAGNOSIS — K635 Polyp of colon: Secondary | ICD-10-CM

## 2018-08-23 DIAGNOSIS — K228 Other specified diseases of esophagus: Secondary | ICD-10-CM | POA: Diagnosis not present

## 2018-08-23 DIAGNOSIS — K641 Second degree hemorrhoids: Secondary | ICD-10-CM | POA: Diagnosis not present

## 2018-08-23 DIAGNOSIS — I998 Other disorder of circulatory system: Secondary | ICD-10-CM

## 2018-08-23 DIAGNOSIS — K5730 Diverticulosis of large intestine without perforation or abscess without bleeding: Secondary | ICD-10-CM

## 2018-08-23 DIAGNOSIS — D509 Iron deficiency anemia, unspecified: Secondary | ICD-10-CM | POA: Diagnosis not present

## 2018-08-23 DIAGNOSIS — K922 Gastrointestinal hemorrhage, unspecified: Secondary | ICD-10-CM

## 2018-08-23 DIAGNOSIS — K648 Other hemorrhoids: Secondary | ICD-10-CM

## 2018-08-23 DIAGNOSIS — K31819 Angiodysplasia of stomach and duodenum without bleeding: Secondary | ICD-10-CM | POA: Diagnosis not present

## 2018-08-23 DIAGNOSIS — D649 Anemia, unspecified: Secondary | ICD-10-CM

## 2018-08-23 HISTORY — PX: ENTEROSCOPY: SHX5533

## 2018-08-23 HISTORY — PX: POLYPECTOMY: SHX5525

## 2018-08-23 HISTORY — PX: COLONOSCOPY WITH PROPOFOL: SHX5780

## 2018-08-23 HISTORY — PX: HOT HEMOSTASIS: SHX5433

## 2018-08-23 LAB — CBC
HCT: 24.8 % — ABNORMAL LOW (ref 39.0–52.0)
Hemoglobin: 7.7 g/dL — ABNORMAL LOW (ref 13.0–17.0)
MCH: 27.3 pg (ref 26.0–34.0)
MCHC: 31 g/dL (ref 30.0–36.0)
MCV: 87.9 fL (ref 80.0–100.0)
Platelets: 273 10*3/uL (ref 150–400)
RBC: 2.82 MIL/uL — ABNORMAL LOW (ref 4.22–5.81)
RDW: 16.8 % — ABNORMAL HIGH (ref 11.5–15.5)
WBC: 7.6 10*3/uL (ref 4.0–10.5)
nRBC: 0.5 % — ABNORMAL HIGH (ref 0.0–0.2)

## 2018-08-23 LAB — TYPE AND SCREEN
ABO/RH(D): B POS
Antibody Screen: NEGATIVE
Unit division: 0
Unit division: 0

## 2018-08-23 LAB — BPAM RBC
BLOOD PRODUCT EXPIRATION DATE: 202003242359
Blood Product Expiration Date: 202003052359
ISSUE DATE / TIME: 202002261538
ISSUE DATE / TIME: 202002271306
Unit Type and Rh: 1700
Unit Type and Rh: 7300

## 2018-08-23 SURGERY — COLONOSCOPY WITH PROPOFOL
Anesthesia: Monitor Anesthesia Care

## 2018-08-23 MED ORDER — LIDOCAINE 2% (20 MG/ML) 5 ML SYRINGE
INTRAMUSCULAR | Status: DC | PRN
  Administered 2018-08-23: 100 mg via INTRAVENOUS

## 2018-08-23 MED ORDER — PROPOFOL 10 MG/ML IV BOLUS
INTRAVENOUS | Status: DC | PRN
  Administered 2018-08-23: 20 mg via INTRAVENOUS
  Administered 2018-08-23: 10 mg via INTRAVENOUS
  Administered 2018-08-23: 20 mg via INTRAVENOUS

## 2018-08-23 MED ORDER — SPOT INK MARKER SYRINGE KIT
PACK | SUBMUCOSAL | Status: DC | PRN
  Administered 2018-08-23: 1.5 mL via SUBMUCOSAL

## 2018-08-23 MED ORDER — LACTATED RINGERS IV SOLN
INTRAVENOUS | Status: AC | PRN
  Administered 2018-08-23: 1000 mL via INTRAVENOUS

## 2018-08-23 MED ORDER — PHENYLEPHRINE 40 MCG/ML (10ML) SYRINGE FOR IV PUSH (FOR BLOOD PRESSURE SUPPORT)
PREFILLED_SYRINGE | INTRAVENOUS | Status: DC | PRN
  Administered 2018-08-23 (×4): 80 ug via INTRAVENOUS
  Administered 2018-08-23 (×3): 40 ug via INTRAVENOUS
  Administered 2018-08-23 (×3): 80 ug via INTRAVENOUS
  Administered 2018-08-23: 40 ug via INTRAVENOUS
  Administered 2018-08-23 (×2): 80 ug via INTRAVENOUS

## 2018-08-23 MED ORDER — ONDANSETRON HCL 4 MG/2ML IJ SOLN
INTRAMUSCULAR | Status: DC | PRN
  Administered 2018-08-23: 4 mg via INTRAVENOUS

## 2018-08-23 MED ORDER — PROPOFOL 500 MG/50ML IV EMUL
INTRAVENOUS | Status: DC | PRN
  Administered 2018-08-23: 150 ug/kg/min via INTRAVENOUS

## 2018-08-23 MED ORDER — LACTATED RINGERS IV SOLN
INTRAVENOUS | Status: DC | PRN
  Administered 2018-08-23: 08:00:00 via INTRAVENOUS

## 2018-08-23 MED ORDER — SPOT INK MARKER SYRINGE KIT
PACK | SUBMUCOSAL | Status: AC
  Filled 2018-08-23: qty 5

## 2018-08-23 SURGICAL SUPPLY — 25 items

## 2018-08-23 NOTE — Progress Notes (Signed)
Internal Medicine Attending:   I saw and examined the patient. I reviewed the resident's note and I agree with the resident's findings and plan as documented in the resident's note.  Patient feels well today with no new complaints.  He denies any lightheadedness or dizziness today.  Patient was initially admitted to the hospital with symptomatic anemia in the setting of a history of dark stools over the last week and being on Xarelto for DVT.  Patient status post 2 units PRBC with only minimal improvement in his hemoglobin to 7.7 from 7 on admission.  Patient is now status post EGD/enteroscopy/colonoscopy today.  GI following multiple angiectasias in his stomach, duodenum, jejunum and colon which were treated with argon plasma coagulation.  We will continue to monitor CBC for now.  Patient's preparation for colonoscopy was inadequate and he may need a repeat colonoscopy while inpatient.  GI to discuss this with the patient.  We will continue PPI twice daily for now.  Will supplement iron.  Continue to hold Xarelto, aspirin and NSAIDs.  Continue with SCDs for DVT prophylaxis.  Patient likely will be stable for DC over the weekend if he continues to improve.

## 2018-08-23 NOTE — Progress Notes (Signed)
   Subjective: [name] reports that he is doing well today, no acute events overnight.  He had just gotten back from his procedures and reports that he is feeling fine.  He does state that he is hungry and is requesting a diet. He denies any dizziness or light headedness today however he has not been up and out of bed yet. He denies any new complaints today.   Objective:  Vital signs in last 24 hours: Vitals:   08/22/18 1325 08/22/18 1700 08/22/18 2148 08/23/18 0331  BP: 123/76 126/80 (!) 117/59 131/71  Pulse: 65 70 73 69  Resp: 12 14 20 19   Temp: 97.8 F (36.6 C) (!) 97.4 F (36.3 C) 98.2 F (36.8 C) 98.2 F (36.8 C)  TempSrc: Oral Oral Oral Oral  SpO2: 100% 100% 100% 100%  Weight:    123 kg    General: Elderly male, NAD, sitting comfortably in bed Cardiac: 3/6 systolic murmur heard greatest at right 2nd intercostal area, RRR Pulmonary: CTABL, no wheezing or rhonchi Abdomen: Soft, non-tender, non-distended    Assessment/Plan:  Active Problems:   Acute GI bleeding  This is a 73 year old male with hypertension, aortic stenosis, OA, lumbar stenosis with neurogenic claudication, asteatotic eczema, achalasia requiring botulinum therapy, chronic venous insufficiency, obesity and constipation who presented with shortness of breath and dizziness for 3 weeks. He reported black stools for about 1 week. 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   Symptomatic normocytic anemia -He has received 2 units pRBC and IV fereheme, hgb this morning was 7.7. He reports that he is feeling okay today, however he has not been up out of bed yet. EGD showed dilation in the distal esophagus, 2 nonbleeding angiectasia is in the stomach, 2 nonbleeding angiectasia is in the jejunum, and a single nonbleeding angiectasia in the duodenum treated with argon plasma coagulation.  Colonoscopy showed inadequate preparation of the colon, hemorrhoids  and perianal exam, 5 colonic angiectasia ectasias treated with argon plasma coagulation, clips were placed, 4 mm polyp in the sigmoid colon, removed with cold snare and clip placed, diverticulosis, nonbleeding nonthrombosed internal hemorrhoids. Will continue to trend Hgb today and monitor for recurrent bleeding.  -Gastroenterology will discuss with patient if he would like preparation to have a full colonoscopy tomorrow versus monitor hemoglobin and potentially do outpatient colonoscopy. ->Clear liquid diet ->Can start p.o. iron if colonoscopy is not done tomorrow -Wait 48 to 72 hours before initiation of NOAC, will need follow-up with cardiology to discuss his anticoagulation regimen -Continue Protonix 40 mgtwice daily x1 month, decrease to once daily after -Holding Xarelto, aspirin and NSAIDs -SCDs for DVT prophylaxis -CBC in AM  Hypertension: -He is on verapamil and HCTZ at home. Blood pressures have bene on the lower side -Continue to hold home medications and resume as needed  Chronic lower back pain due to spinal stenosis: - continue home gabapentin 600 mg TID and oxycodone- acetaminophen 10-325 mg q8h  Glaucoma: -Continue home Combigan  FEN: No fluids, replete lytes prn, CLD diet, NPO at [facility] VTE ppx: SCDs Code Status: FULL    Dispo: Anticipated discharge is pending clinical improvement.  Shawn Noble, MD 08/23/2018, 6:29 AM Pager: [phone]

## 2018-08-23 NOTE — Transfer of Care (Signed)
Immediate Anesthesia Transfer of Care Note  Patient: [name]  Procedure(s) Performed: ESOPHAGOGASTRODUODENOSCOPY (EGD) WITH PROPOFOL (N/A ) COLONOSCOPY WITH PROPOFOL (N/A ) HOT HEMOSTASIS (ARGON PLASMA COAGULATION/BICAP) (N/A ) SUBMUCOSAL TATTOO INJECTION HEMOSTASIS CLIP PLACEMENT POLYPECTOMY  Patient Location: Endoscopy Unit  Anesthesia Type:MAC  Level of Consciousness: drowsy  Airway & Oxygen Therapy: Patient Spontanous Breathing and Patient connected to nasal cannula oxygen  Post-op Assessment: Report given to RN and Post -op Vital signs reviewed and stable  Post vital signs: Reviewed and stable  Last Vitals:  Vitals Value Taken Time  BP 103/38 08/23/2018  8:58 AM  Temp    Pulse 65 08/23/2018  8:58 AM  Resp 26 08/23/2018  8:59 AM  SpO2 98 % 08/23/2018  8:58 AM  Vitals shown include unvalidated device data.  Last Pain:  Vitals:   08/23/18 0858  TempSrc:   PainSc: 0-No pain         Complications: No apparent anesthesia complications

## 2018-08-23 NOTE — Op Note (Addendum)
[facility] Patient Name: [name] Procedure Date : 08/23/2018 MRN: [id number] Attending MD: Justice Britain , MD Date of Birth: Jun 21, 1946 CSN: [id number] Age: 73 Admit Type: Inpatient Procedure:                Small bowel enteroscopy Indications:              Iron deficiency anemia, Hx of EGJ Botox injection                            (however no current dysphagia) Providers:                Justice Britain, MD, Cleda Daub, RN,                            William Dalton, Technician Referring MD:             Estill Cotta. Loletha Carrow, MD, Dr. Dareen Piano Medicines:                Monitored Anesthesia Care Complications:            No immediate complications. Estimated Blood Loss:     Estimated blood loss: none. Procedure:                Pre-Anesthesia Assessment:                           - Prior to the procedure, a History and Physical                            was performed, and patient medications and                            allergies were reviewed. The patient's tolerance of                            previous anesthesia was also reviewed. The risks                            and benefits of the procedure and the sedation                            options and risks were discussed with the patient.                            All questions were answered, and informed consent                            was obtained. Prior Anticoagulants: The patient has                            taken Xarelto (rivaroxaban), last dose was 2 days                            prior to procedure. ASA Grade Assessment: III - A  patient with severe systemic disease. After                            reviewing the risks and benefits, the patient was                            deemed in satisfactory condition to undergo the                            procedure.                           After obtaining informed consent, the endoscope was                             passed under direct vision. Throughout the                            procedure, the patient's blood pressure, pulse, and                            oxygen saturations were monitored continuously. The                            GIF-H190 ([id number]) Olympus gastroscope was                            introduced through the mouth, and advanced to the                            second part of duodenum. The upper GI endoscopy was                            accomplished without difficulty. The patient                            tolerated the procedure. After obtaining informed                            consent, the endoscope was passed under direct                            vision. Throughout the procedure, the patient's                            blood pressure, pulse, and oxygen saturations were                            monitored continuously. The PCF-H190DL ([id number])                            Olympus pediatric colonoscope was introduced                            through the mouth  and advanced to the proximal                            jejunum. Scope In: Scope Out: Findings:      The lumen of the distal esophagus was moderately dilated.      A hypertonic lower esophageal sphincter was found.      No other gross lesions were noted in the entire esophagus.      Two diminutive no bleeding angioectasias were found in the cardia and in       the gastric fundus. Fulguration to ablate the lesion to prevent bleeding       by argon plasma was successful using gastric settings.      No other gross lesions were noted in the entire examined stomach.      A single diminutive angioectasia without bleeding was found in the       duodenal bulb. Fulguration to ablate the lesion to prevent bleeding by       argon plasma was successful using right colon settings.      No other gross lesions were noted in the entire examined duodenum.      Two diminutive angioectasias without bleeding were found in the  jejunum.       Fulguration to ablate the lesion to prevent bleeding by argon plasma was       successful using right colon settings.      Normal mucosa was found in the proximal jejunum otherwise. Area was       tattooed with an injection of Spot (carbon black) to demarcate extent of       today's SBE. Impression:               - Dilation in the distal esophagus. Hypertonic                            lower esophageal sphincter. No other gross lesions                            in esophagus.                           - Two non-bleeding angioectasias in the stomach.                            Treated with argon plasma coagulation (APC). No                            other gross lesions in the stomach.                           - A single non-bleeding angioectasia in the                            duodenum. Treated with argon plasma coagulation                            (APC). No other gross lesions in the entire  examined duodenum.                           - Two non-bleeding angioectasias in the jejunum.                            Treated with argon plasma coagulation (APC). Normal                            mucosa was found in the proximal jejunum with                            distal extent of SBE.                           - No specimens collected. Recommendation:           - Proceed to scheduled colonoscopy.                           - Maintain PPI BID x 27-month then decrease to once                            daily thereafter.                           - Observe patient's clinical course.                           - Iron supplementation to be discussed in                            colonoscopy report.                           - The findings and recommendations were discussed                            with the patient.                           - The findings and recommendations were discussed                            with the referring  physician. Procedure Code(s):        --- Professional ---                           [phone], Esophagogastroduodenoscopy, flexible,                            transoral; with control of bleeding, any method Diagnosis Code(s):        --- Professional ---                           K22.8, Other specified diseases of esophagus  K31.819, Angiodysplasia of stomach and duodenum                            without bleeding                           K55.20, Angiodysplasia of colon without hemorrhage                           D50.9, Iron deficiency anemia, unspecified CPT copyright 2018 American [employer] Association. All rights reserved. The codes documented in this report are preliminary and upon coder review may  be revised to meet current compliance requirements. Justice Britain, MD 08/23/2018 9:04:40 AM Number of Addenda: 0

## 2018-08-23 NOTE — Anesthesia Preprocedure Evaluation (Signed)
Anesthesia Evaluation  Patient identified by MRN, date of birth, ID band Patient awake    Reviewed: Allergy & Precautions, H&P , NPO status , Patient's Chart, lab work & pertinent test results  Airway Mallampati: II   Neck ROM: full    Dental   Pulmonary former smoker,    breath sounds clear to auscultation       Cardiovascular hypertension,  Rhythm:regular Rate:Normal  Mild AS   Neuro/Psych    GI/Hepatic GERD  ,  Endo/Other  obese  Renal/GU      Musculoskeletal  (+) Arthritis ,   Abdominal   Peds  Hematology  (+) Blood dyscrasia, anemia ,   Anesthesia Other Findings   Reproductive/Obstetrics                             Anesthesia Physical Anesthesia Plan  ASA: II  Anesthesia Plan: MAC   Post-op Pain Management:    Induction: Intravenous  PONV Risk Score and Plan: 1 and Propofol infusion, Ondansetron and Treatment may vary due to age or medical condition  Airway Management Planned: Nasal Cannula  Additional Equipment:   Intra-op Plan:   Post-operative Plan:   Informed Consent: I have reviewed the patients History and Physical, chart, labs and discussed the procedure including the risks, benefits and alternatives for the proposed anesthesia with the patient or authorized representative who has indicated his/her understanding and acceptance.       Plan Discussed with: CRNA, Anesthesiologist and Surgeon  Anesthesia Plan Comments:         Anesthesia Quick Evaluation

## 2018-08-23 NOTE — Op Note (Addendum)
[facility] Patient Name: [name] Procedure Date : 08/23/2018 MRN: [id number] Attending MD: Justice Britain , MD Date of Birth: 09-08-1945 CSN: [id number] Age: 73 Admit Type: Inpatient Procedure:                Colonoscopy Indications:              Iron deficiency anemia Providers:                Justice Britain, MD, Cleda Daub, RN,                            William Dalton, Technician Referring MD:             Estill Cotta. Loletha Carrow, MD, Aldine Contes Medicines:                Monitored Anesthesia Care Complications:            No immediate complications. Estimated Blood Loss:     Estimated blood loss was minimal. Procedure:                Pre-Anesthesia Assessment:                           - Prior to the procedure, a History and Physical                            was performed, and patient medications and                            allergies were reviewed. The patient's tolerance of                            previous anesthesia was also reviewed. The risks                            and benefits of the procedure and the sedation                            options and risks were discussed with the patient.                            All questions were answered, and informed consent                            was obtained. Prior Anticoagulants: The patient has                            taken Xarelto (rivaroxaban), last dose was 2 days                            prior to procedure. ASA Grade Assessment: III - A                            patient with severe systemic disease. After  reviewing the risks and benefits, the patient was                            deemed in satisfactory condition to undergo the                            procedure.                           After obtaining informed consent, the colonoscope                            was passed under direct vision. Throughout the                            procedure, the  patient's blood pressure, pulse, and                            oxygen saturations were monitored continuously. The                            PCF-H190DL ([id number]) Olympus pediatric colonoscope                            was introduced through the anus and advanced to the                            the cecum, identified by appendiceal orifice and                            ileocecal valve. The colonoscopy was somewhat                            difficult due to a redundant colon, significant                            looping and a tortuous colon. Successful completion                            of the procedure was aided by changing the                            patient's position, using manual pressure,                            straightening and shortening the scope to obtain                            bowel loop reduction and using scope torsion. The                            patient tolerated the procedure. The quality of the  bowel preparation was inadequate. The ileocecal                            valve, appendiceal orifice, and rectum were                            photographed. Scope In: 8:12:03 AM Scope Out: 8:48:43 AM Scope Withdrawal Time: 0 hours 25 minutes 42 seconds  Total Procedure Duration: 0 hours 36 minutes 40 seconds  Findings:      Hemorrhoids were found on perianal exam.      A large amount of semi-liquid stool was found in the entire colon,       precluding visualization. Lavage of the area was performed using copious       amounts, resulting in incomplete clearance with continued poor       visualization.      Five small angioectasias with typical arborization were found in the       transverse colon (1), in the ascending colon (3) and in the cecum (1).       Fulguration to ablate the lesion to prevent bleeding by argon plasma was       successful. To prevent bleeding post-intervention of the larger lesion,       three hemostatic  clips were successfully placed (MR conditional) (2       separate angioectasias in the [facility]). There was no bleeding during, or at       the end, of the procedure.      A 4 mm polyp was found in the sigmoid colon. The polyp was sessile. The       polyp was removed with a cold snare. Resection and retrieval were       complete. Oozing occured but was self-limited. To prevent further       bleeding after the polypectomy, one hemostatic clip was successfully       placed (MR conditional). There was no bleeding at the end of the       procedure.      Multiple small-mouthed diverticula were found in the sigmoid colon and       descending colon.      Non-bleeding non-thrombosed internal hemorrhoids were found during       retroflexion, during perianal exam and during digital exam. The       hemorrhoids were Grade II (internal hemorrhoids that prolapse but reduce       spontaneously). Impression:               - Preparation of the colon was inadequate.                           - Hemorrhoids found on perianal exam.                           - Stool in the entire examined colon.                           - Five colonic angioectasias. Treated with argon                            plasma coagulation (APC). Clips (MR conditional)  were placed.                           - One 4 mm polyp in the sigmoid colon, removed with                            a cold snare. Resected and retrieved. Clip (MR                            conditional) was placed.                           - Diverticulosis in the sigmoid colon and in the                            descending colon.                           - Non-bleeding non-thrombosed internal hemorrhoids. Recommendation:           - The patient will be observed post-procedure,                            until all discharge criteria are met.                           - Return patient to hospital ward for ongoing care.                            - Will discuss with patient if he wants to do                            further preparation today to have repeat full                            colonoscopy tomorrow vs monitor hemogram and                            potentially do as an outpatient as this is not an                            adequate preparation for screening purposes and he                            is due as a result of FHx of Colon Cancer.                           - Clear liquid diet.                           - Await pathology results.                           - Trend Hgb/Hct.                           -  Patient should receive 1 dose of IV Iron while in                            house and then a follow up as an outpatient in                            2-weeks. PO Iron once daily to be initiated once                            decision is made if colonoscopy or not repeat.                           - Would ideally wait 48-72 hours before reinitation                            of NOAC. Consider the role of discussion with                            cardiology if he could tolerate another NOAC with                            decreased risk of bleeding complications such as                            Eliquis. If anticoagulation is necessary then would                            consider IV heparin without bolus.                           - The findings and recommendations were discussed                            with the patient.                           - The findings and recommendations were discussed                            with the patient's family. Procedure Code(s):        --- Professional ---                           [phone], 59, Colonoscopy, flexible; with control of                            bleeding, any method                           [id number], Colonoscopy, flexible; with removal of                            tumor(s), polyp(s), or other lesion(s) by snare  technique Diagnosis Code(s):        --- Professional ---                           K64.1, Second degree hemorrhoids                           K55.20, Angiodysplasia of colon without hemorrhage                           D12.5, Benign neoplasm of sigmoid colon                           D50.9, Iron deficiency anemia, unspecified                           K57.30, Diverticulosis of large intestine without                            perforation or abscess without bleeding CPT copyright 2018 American Medical Association. All rights reserved. The codes documented in this report are preliminary and upon coder review may  be revised to meet current compliance requirements. Gabriel Mansouraty, MD 08/23/2018 9:13:59 AM Number of Addenda: 0 

## 2018-08-23 NOTE — Progress Notes (Signed)
Physical Therapy Evaluation Patient Details Name: [name] MRN: [id number] DOB: July 01, 1945 Today's Date: 08/23/2018   History of Present Illness  Patient is 73 y/o male admitted to hospital with anemia secondary to acute GI bleed. Patient is s/p EGD showing dilation in the distal esophagus, 2 nonbleeding angiectasia is in the stomach, 2 nonbleeding angiectasia is in the jejunum, and a single nonbleeding angiectasia in the duodenum treated with argon plasma coagulation.  Colonoscopy also completed which showed multiple colonic angiectasia ectasias . PMH includes HTN, chronic low back pain, DVT, and glaucoma.   Clinical Impression  Patient admitted to hospital secondary to problems above and with deficits below. Patient required min guard to stand and ambulate with use of RW. Patient fatigued following functional mobility. Patient reports walking a little slower than his baseline. Given functional mobility deficits, recommending HHPT following d/c. Patient reports adequate assistance at home. Patient will benefit from acute physical therapy to maximize independence and safety with functional mobility.     Follow Up Recommendations Home health PT;Supervision for mobility/OOB    Equipment Recommendations  None recommended by PT    Recommendations for Other Services       Precautions / Restrictions Precautions Precautions: Fall Restrictions Weight Bearing Restrictions: No      Mobility  Bed Mobility Overal bed mobility: Needs Assistance Bed Mobility: Supine to Sit     Supine to sit: Supervision     General bed mobility comments: Patient required supervision for bed mobility for safety  Transfers Overall transfer level: Needs assistance Equipment used: Rolling walker (2 wheeled) Transfers: Sit to/from Stand Sit to Stand: Min guard         General transfer comment: Patient required min guard to stand with use of RW for safety. Verbal cues for hand placement prior to  standing with RW.   Ambulation/Gait Ambulation/Gait assistance: Min guard Gait Distance (Feet): 200 Feet Assistive device: Rolling walker (2 wheeled) Gait Pattern/deviations: Step-through pattern;Decreased stride length Gait velocity: decreased Gait velocity interpretation: <1.8 ft/sec, indicate of risk for recurrent falls General Gait Details: Patient ambulated with min guard and use of RW for safety. Verbal cues for proximity and sequencing with RW. Patient reports he is walking a little slower than his baseline. Patient with increased fatigue following mobility.   Stairs            Wheelchair Mobility    Modified Rankin (Stroke Patients Only)       Balance Overall balance assessment: Needs assistance Sitting-balance support: No upper extremity supported;Feet supported Sitting balance-Leahy Scale: Good     Standing balance support: Bilateral upper extremity supported Standing balance-Leahy Scale: Poor Standing balance comment: reliant on BUE support to maintain standing balance                             Pertinent Vitals/Pain Pain Assessment: No/denies pain    Home Living Family/patient expects to be discharged to:: Private residence Living Arrangements: Alone Available Help at Discharge: Family;Available 24 hours/day;Personal care attendant Type of Home: Independent living facility Home Access: Level entry     Home Layout: One level Home Equipment: Walker - 4 wheels;Other (comment)(walking stick) Additional Comments: Reports aide comes 5x a week M-F for 2.5 hours and nephew assists patient on weekends.     Prior Function Level of Independence: Needs assistance   Gait / Transfers Assistance Needed: Patient reports using a rollator for mobility   ADL's / Homemaking Assistance Needed: Reports aide  comes 5x a week M-F for 2.5 hours and assists with ADLs and IADLs. States nephew assists on the weeknd        Hand Dominance         Extremity/Trunk Assessment   Upper Extremity Assessment Upper Extremity Assessment: Overall WFL for tasks assessed    Lower Extremity Assessment Lower Extremity Assessment: Generalized weakness    Cervical / Trunk Assessment Cervical / Trunk Assessment: Normal  Communication   Communication: Expressive difficulties  Cognition Arousal/Alertness: Awake/alert Behavior During Therapy: WFL for tasks assessed/performed Overall Cognitive Status: Within Functional Limits for tasks assessed                                        General Comments General comments (skin integrity, edema, etc.): Patient reports he like to use a rollator for ambulation because it has a seat    Exercises     Assessment/Plan    PT Assessment Patient needs continued PT services  PT Problem List Decreased strength;Decreased range of motion;Decreased activity tolerance;Decreased balance;Decreased mobility;Decreased knowledge of use of DME       PT Treatment Interventions DME instruction;Gait training;Functional mobility training;Therapeutic activities;Therapeutic exercise;Balance training;Patient/family education    PT Goals (Current goals can be found in the Care Plan section)  Acute Rehab PT Goals Patient Stated Goal: go home PT Goal Formulation: With patient Time For Goal Achievement: 09/06/18 Potential to Achieve Goals: Good    Frequency Min 3X/week   Barriers to discharge        Co-evaluation               AM-PAC PT "6 Clicks" Mobility  Outcome Measure Help needed turning from your back to your side while in a flat bed without using bedrails?: None Help needed moving from lying on your back to sitting on the side of a flat bed without using bedrails?: A Little Help needed moving to and from a bed to a chair (including a wheelchair)?: A Little Help needed standing up from a chair using your arms (e.g., wheelchair or bedside chair)?: A Little Help needed to walk in  hospital room?: A Little Help needed climbing 3-5 steps with a railing? : A Lot 6 Click Score: 18    End of Session Equipment Utilized During Treatment: Gait belt Activity Tolerance: Patient tolerated treatment well Patient left: in chair;with call bell/phone within reach;with chair alarm set Nurse Communication: Mobility status PT Visit Diagnosis: Other abnormalities of gait and mobility (R26.89);Muscle weakness (generalized) (M62.81)    Time: 9449-6759 PT Time Calculation (min) (ACUTE ONLY): 24 min   Charges:   PT Evaluation $PT Eval Moderate Complexity: 1 Mod PT Treatments $Gait Training: 8-22 mins        Erick Blinks, SPT  Erick Blinks 08/23/2018, 5:18 PM

## 2018-08-23 NOTE — Interval H&P Note (Signed)
History and Physical Interval Note:  08/23/2018 7:33 AM  [name]  has presented today for surgery, with the diagnosis of Anemia.  Dark, FOBT positive stool.  The various methods of treatment have been discussed with the patient and family. After consideration of risks, benefits and other options for treatment, the patient has consented to  Procedure(s): ESOPHAGOGASTRODUODENOSCOPY (EGD) WITH PROPOFOL (N/A) COLONOSCOPY WITH PROPOFOL (N/A) as a surgical intervention .  The patient's history has been reviewed, patient examined, no change in status, stable for surgery.  I have reviewed the patient's chart and labs.  Questions were answered to the patient's satisfaction.     [employer]

## 2018-08-23 NOTE — Anesthesia Postprocedure Evaluation (Signed)
Anesthesia Post Note  Patient: MICAHEL OMLOR  Procedure(s) Performed: ESOPHAGOGASTRODUODENOSCOPY (EGD) WITH PROPOFOL (N/A ) COLONOSCOPY WITH PROPOFOL (N/A ) HOT HEMOSTASIS (ARGON PLASMA COAGULATION/BICAP) (N/A ) SUBMUCOSAL TATTOO INJECTION HEMOSTASIS CLIP PLACEMENT POLYPECTOMY     Patient location during evaluation: PACU Anesthesia Type: MAC Level of consciousness: awake and alert Pain management: pain level controlled Vital Signs Assessment: post-procedure vital signs reviewed and stable Respiratory status: spontaneous breathing, nonlabored ventilation, respiratory function stable and patient connected to nasal cannula oxygen Cardiovascular status: stable and blood pressure returned to baseline Postop Assessment: no apparent nausea or vomiting Anesthetic complications: no    Last Vitals:  Vitals:   08/23/18 0912 08/23/18 0953  BP: (!) 122/97 129/66  Pulse:  63  Resp: 20 (!) 22  Temp:  (!) 36.3 C  SpO2: 100% 100%    Last Pain:  Vitals:   08/23/18 0953  TempSrc: Oral  PainSc:                  [place]

## 2018-08-24 DIAGNOSIS — K5521 Angiodysplasia of colon with hemorrhage: Secondary | ICD-10-CM

## 2018-08-24 DIAGNOSIS — D500 Iron deficiency anemia secondary to blood loss (chronic): Secondary | ICD-10-CM

## 2018-08-24 LAB — CBC
HEMATOCRIT: 27.4 % — AB (ref 39.0–52.0)
Hemoglobin: 8.5 g/dL — ABNORMAL LOW (ref 13.0–17.0)
MCH: 27.8 pg (ref 26.0–34.0)
MCHC: 31 g/dL (ref 30.0–36.0)
MCV: 89.5 fL (ref 80.0–100.0)
Platelets: 261 10*3/uL (ref 150–400)
RBC: 3.06 MIL/uL — ABNORMAL LOW (ref 4.22–5.81)
RDW: 17.1 % — ABNORMAL HIGH (ref 11.5–15.5)
WBC: 11.8 10*3/uL — ABNORMAL HIGH (ref 4.0–10.5)
nRBC: 0.9 % — ABNORMAL HIGH (ref 0.0–0.2)

## 2018-08-24 NOTE — Progress Notes (Addendum)
     [facility] Progress Note   Chief Complaint:   Anemia and FOBT positive stool   SUBJECTIVE:   No complaints.  He is asking about going home   ASSESSMENT AND PLAN:   64. 73 year old male with dark FOBT positive stool and iron deficiency in setting of Eliquis. 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 He had nonbleeding angioectasias in the stomach, duodenum and jejunum treated with APC yesterday. Colonoscopy done yesterday but bowel prep was inadequate.  Several non-bleeding colonic angiectasias treated with APC and clips.  -Prefer Eliquis be held another couple of days if acceptable.  -He is status post 2 units of blood this admission, hemoglobin up overnight from 7.7 to 8.5 -he received  IV iron yesterday -recommend oral iron replacement at time of discharge -Our office will call patient with hospital follow-up.    2.  Colon polyps.  A 4 mm polyp was removed from the sigmoid colon.   -Polyp pathology pending -Patient will need repeat colonoscopy as bowel prep was inadequate on yesterday's exam.  This can be done as outpatient   OBJECTIVE:     Vital signs in last 24 hours: Temp:  [97.3 F (36.3 C)-98.9 F (37.2 C)] 98.1 F (36.7 C) (02/29 0503) Pulse Rate:  [63-75] 63 (02/29 0503) Resp:  [18-22] 21 (02/29 0503) BP: (107-129)/(60-66) 107/60 (02/29 0503) SpO2:  [98 %-100 %] 98 % (02/29 0503) Weight:  [126.5 kg] 126.5 kg (02/28 1950) Last BM Date: 08/23/18 General:   Alert, well-developed male in NAD Heart:  Regular rate and rhythm; + murmur.  Trace BLE edema   Pulm: Normal respiratory effort, lungs CTA bilaterally without wheezes or crackles. Abdomen:  Soft, nondistended, nontender.  Normal bowel sounds, no masses felt.       Neurologic:  Alert and  oriented x4;  grossly normal neurologically. Psych:  Pleasant, cooperative.  Normal mood and affect.   Intake/Output from previous day: 02/28 0701 - 02/29 0700 In: 1580 [P.O.:780; I.V.:800] Out: 300  [Urine:300] Intake/Output this shift: No intake/output data recorded.  Lab Results: Recent Labs    08/22/18 0414 08/23/18 0357 08/24/18 0419  WBC 6.2 7.6 11.8*  HGB 7.3* 7.7* 8.5*  HCT 23.5* 24.8* 27.4*  PLT 289 273 261   BMET Recent Labs    08/22/18 0414  NA 138  K 3.8  CL 105  CO2 27  GLUCOSE 85  BUN 19  CREATININE 1.25*  CALCIUM 9.1     Active Problems:   Acute GI bleeding     LOS: 3 days   Tye Savoy ,NP 08/24/2018, 9:22 AM    Attending physician's note   I have taken an interval history, reviewed the chart and examined the patient. I agree with the Advanced Practitioner's note, impression and recommendations.  Hgb remains stable s/p APC of AVM in stomach and proximal bowel Ok to restart Eliquis in 3 days Will need routine monitoring of CBC and Iron panel as outpatient and IV iron infusion Follow up in office We will sign off, available if have questions  K. Denzil Magnuson , MD [phone]

## 2018-08-24 NOTE — Discharge Instructions (Signed)
[name],   It has been a pleasure working with you and we are glad you're feeling better. You were hospitalized for anemia (low red blood cells) due to a stomach bleed. The stomach doctor did some procedures and lasered the vessels that may have been causing the bleeding, they were not able to see the entire intestines and you will need to repeat the colonoscopy at a later date. We think that it was exacerbated by you taking the Xeralto, aspirin, and other NSAIDs (such as ibuprofen). Please do not take your Xeralto until Tuesday, 3/3. Please avoid taking extra NSAIDs.  Start taking iron supplements, I have sent in a prescription to your pharmacy Start taking pantoprazole 40 mg twice a day for the next month, you can take it once a day after that Stop taking omeprazole daily  Please make an appointment with the clinic next week, I will send a message to the front desk to set up an appointment but if you don't hear from them on Monday please call to set up an appointment.  Please follow up with gastroenterology to set up an appointment.    If your symptoms worsen or you develop new symptoms, please seek medical help whether it is your primary care provider or emergency department.  If you have any questions about this hospitalization please call [phone].

## 2018-08-24 NOTE — Progress Notes (Signed)
   Subjective: [name] reports that he is doing okay today, no acute events overnight.  He states that he walked around yesterday and is still having some lightheadedness but that it had improved from when he first came in.  Solid bowel movement was after colonoscopy, has not had anything since then.  He denies any abdominal pain, nausea, vomiting.  We discussed the plan for today and he is in agreement  Objective:  Vital signs in last 24 hours: Vitals:   08/23/18 0953 08/23/18 1807 08/23/18 1950 08/24/18 0503  BP: 129/66 118/62 112/62 107/60  Pulse: 63 75 71 63  Resp: (!) 22 (!) 21 18 (!) 21  Temp: (!) 97.3 F (36.3 C) 98.9 F (37.2 C) 98.7 F (37.1 C) 98.1 F (36.7 C)  TempSrc: Oral Oral Oral Oral  SpO2: 100% 98% 100% 98%  Weight:   126.5 kg   Height:        General: Elderly male, NAD, sitting comfortably in bed Cardiac: 3/6 systolic murmur, RRR Pulmonary: CTA BL, no wheezing or rhonchi Abdomen: Soft, nontender, nondistended, normoactive bowel sounds     Assessment/Plan:  Active Problems:   Acute GI bleeding  This is a 73 year old male with hypertension, aortic stenosis, OA, lumbar stenosis with neurogenic claudication, asteatotic eczema, achalasia requiring botulinum therapy, chronic venous insufficiency, obesity and constipation who presented with shortness of breath and dizziness for 3 weeks. He reported black stools for about 1 week. 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   Symptomatic normocytic anemia -He is s/p 2 units pRBC and IV feraheme. Hgb this morning was 8.5. He reports that he still having a little bit of dizziness when he was working yesterday however states that it is better than when he came in.  He has not had a bowel movement he has been here, denies any abdominal pain, nausea, or vomiting.  -EGD showed dilation in the distal esophagus, 2 nonbleeding angiectasia is in the stomach, 2  nonbleeding angiectasia is in the jejunum, and a single nonbleeding angiectasia in the duodenum treated with argon plasma coagulation.  Colonoscopy showed inadequate preparation of the colon, hemorrhoids and perianal exam, 5 colonic angiectasia ectasias treated with argon plasma coagulation, clips were placed, 4 mm polyp in the sigmoid colon, removed with cold snare and clip placed, diverticulosis, nonbleeding nonthrombosed internal hemorrhoids.  -Gastroenterology will discuss with patient if he would like preparation to have a full colonoscopy while inpatient versus monitor hemoglobin and potentially do outpatient colonoscopy. -If outpatient colonoscopy will start iron PO today, and start Xeralto today to monitor for recurrent bleeding -Continue Protonix 40 mg twice daily x1 month, decrease to once daily after -Holding aspirin and NSAIDs, may restart Xeralto -SCDs for DVT prophylaxis -CBC in AM  Hypertension: -He is on verapamil and HCTZ at home. Blood pressures have been on the lower side -Continue to hold home medications and resume as needed, may need to hold on discharge  Chronic lower back pain due to spinal stenosis: -Continue home gabapentin 600 mg TID and oxycodone- acetaminophen 10-325 mg q8h PRN  Glaucoma: -Continue home Combigan  FEN: No fluids, replete lytes prn, CLD diet, NPO at [facility] VTE ppx: SCDs Code Status: FULL    Dispo: Anticipated discharge is pending clinical improvement.   [name], MD 08/24/2018, 6:29 AM Pager: [phone]

## 2018-08-24 NOTE — Progress Notes (Signed)
  Date: 08/24/2018  Patient name: [name]  Medical record number: [id number]  Date of birth: 04/13/1946   This patient's plan of care was discussed with the house staff. Please see Dr. Dorothyann Peng note for complete details. I concur with her findings and plan.   Sid Falcon, MD 08/24/2018, 8:35 PM

## 2018-08-25 ENCOUNTER — Encounter (HOSPITAL_COMMUNITY): Payer: Self-pay | Admitting: Gastroenterology

## 2018-08-25 DIAGNOSIS — Z6836 Body mass index (BMI) 36.0-36.9, adult: Secondary | ICD-10-CM

## 2018-08-25 DIAGNOSIS — Z888 Allergy status to other drugs, medicaments and biological substances status: Secondary | ICD-10-CM

## 2018-08-25 LAB — CBC
HCT: 24.9 % — ABNORMAL LOW (ref 39.0–52.0)
Hemoglobin: 7.7 g/dL — ABNORMAL LOW (ref 13.0–17.0)
MCH: 27.5 pg (ref 26.0–34.0)
MCHC: 30.9 g/dL (ref 30.0–36.0)
MCV: 88.9 fL (ref 80.0–100.0)
Platelets: 250 10*3/uL (ref 150–400)
RBC: 2.8 MIL/uL — ABNORMAL LOW (ref 4.22–5.81)
RDW: 17.1 % — AB (ref 11.5–15.5)
WBC: 9.7 10*3/uL (ref 4.0–10.5)
nRBC: 0.5 % — ABNORMAL HIGH (ref 0.0–0.2)

## 2018-08-25 MED ORDER — PANTOPRAZOLE SODIUM 40 MG PO TBEC: 40.0000 mg | DELAYED_RELEASE_TABLET | Freq: Two times a day (BID) | ORAL | 0 refills | Status: DC

## 2018-08-25 MED ORDER — IRON 325 (65 FE) MG PO TABS: 325.0000 mg | ORAL_TABLET | Freq: Every day | ORAL | 0 refills | Status: DC

## 2018-08-25 MED ORDER — RIVAROXABAN 15 MG PO TABS: 15.0000 mg | ORAL_TABLET | Freq: Two times a day (BID) | ORAL | 0 refills | Status: DC

## 2018-08-25 NOTE — Progress Notes (Signed)
   Subjective: [name] reported feeling well this morning. Denies any SOB, dyspnea or weakness. He states he lives at a senior resident apartment complex. He said he has a ride home today. All questions and concerns addressed.  Objective:  Vital signs in last 24 hours: Vitals:   08/24/18 0503 08/24/18 1004 08/24/18 2040 08/25/18 0506  BP: 107/60 124/77 110/64 115/80  Pulse: 63 68 77 75  Resp: (!) 21 18 16 17   Temp: 98.1 F (36.7 C) 98.7 F (37.1 C) 98.3 F (36.8 C) 99 F (37.2 C)  TempSrc: Oral Oral Oral Oral  SpO2: 98% 99% 99% 96%  Weight:   126.7 kg   Height:       Gen: seen comfortably sitting up in the chair, no distress CV: RRR, no murmurs Pulm: CTA bilaterally, no wheezing  Skin: warm and dry Psych: normal mood and affect   Assessment/Plan:  Active Problems:   Acute GI bleeding  This is a 73 year old male with hypertension, aortic stenosis, OA, lumbar stenosis with neurogenic claudication, asteatotic eczema, achalasia requiring botulinum therapy, chronic venous insufficiency, obesity and constipation who presented with shortness of breath and dizzinessfor 3 weeks.He reported black stools for about 1 week. On Xaralto since 12/19 due to unprovoked DVT, he also takes daily ASA, tramadol, and occasional ibuprofen.   Symptomatic normocyticanemia - Hgb 7.7 this morning, down from 8.5 - PT recommended home health PT  - GI is planning to do an outpatient colonoscopy  -If outpatient colonoscopy will start iron PO today -Continue Protonix 40 mg twice daily x1 month, decrease to once daily after -Restart Xarelto in 3 days -Start iron supplements at discharge  -Holding aspirin and NSAIDs -To [facility] up with the [facility] clinic within one week for repeat CBC   Hypertension: -He is on verapamil and HCTZ at home. Blood pressures have been on the lower side -Continue to hold home medications and resume as needed, may need to hold on discharge   Dispo: Patient is medically  stable for discharge today.    Shawn Craze, DO 08/25/2018, 6:40 AM Pager: [phone]

## 2018-08-25 NOTE — Care Management Note (Signed)
Case Management Note  Patient Details  Name: [name] MRN: [id number] Date of Birth: [birth year]  Subjective/Objective:                    Action/Plan:  Patient would like to use [facility] for [facility] services. Referral accepted by Elzie Rings [facility]. No DME needs, patient states he has ride home. He has coverage with [facility] Medicare and Medicaid stating that some meds are free and some he pays out of pocket $1.25 for. He denied difficulties affording meds. CM cannot provide covered patient with additional financial assistance.  Expected Discharge Date:  08/25/18               Expected Discharge Plan:  [name]  In-House Referral:     Discharge planning Services  CM Consult  Post Acute Care Choice:    Choice offered to:  Patient  DME Arranged:    DME Agency:     HH Arranged:  RN, PT, Nurse's Aide [place] Agency:  [place]  Status of Service:  Completed, signed off  If discussed at [place] of Stay Meetings, dates discussed:    Additional Comments:  Carles Collet, RN 08/25/2018, 12:58 PM

## 2018-08-25 NOTE — Progress Notes (Signed)
  Date: 08/25/2018  Patient name: [name]  Medical record number: [id number]  Date of birth: June 03, 1946   I have seen and evaluated this patient and I have discussed the plan of care with the house staff. Please see Dr. Olevia Perches note for complete details. I concur with her findings and plan.    Sid Falcon, MD 08/25/2018, 12:13 PM

## 2018-08-25 NOTE — Progress Notes (Signed)
CSW acknowledges consult. CSW met with the patient at the bedside. Patient reports that he lives in a senior apartment complex. He stated that he does not live in a SNF or ALF. He stated that he has a home health aide but wasn't sure of the facility at the moment. He reports that he lives independently in his own apartment.   He reported that he has a ride for when he is discharged from the hospital. Patient is requiring assistance with his medications. CSW referred case management to speak with the patients. He stated he feels safe returning to his residence and reported no other concerns.   Patient should be able to return to his residence once he is medically stable.   CSW signing off.   Domenic Schwab, MSW, [place]

## 2018-08-25 NOTE — Progress Notes (Signed)
Patient discharged to assisted living facility . Patient AVS reviewed and signed. Patient capable re-verbalizing medications and follow-up appointments. IV removed. Patient belongings sent with patient. Patient educated to return to the ED in the event of SOB, chest pain or dizziness.   Farley Ly RN

## 2018-08-26 ENCOUNTER — Telehealth: Payer: Self-pay

## 2018-08-26 DIAGNOSIS — K5900 Constipation, unspecified: Secondary | ICD-10-CM | POA: Diagnosis not present

## 2018-08-26 DIAGNOSIS — Z86718 Personal history of other venous thrombosis and embolism: Secondary | ICD-10-CM | POA: Diagnosis not present

## 2018-08-26 DIAGNOSIS — G629 Polyneuropathy, unspecified: Secondary | ICD-10-CM | POA: Diagnosis not present

## 2018-08-26 DIAGNOSIS — M150 Primary generalized (osteo)arthritis: Secondary | ICD-10-CM | POA: Diagnosis not present

## 2018-08-26 DIAGNOSIS — M48062 Spinal stenosis, lumbar region with neurogenic claudication: Secondary | ICD-10-CM | POA: Diagnosis not present

## 2018-08-26 DIAGNOSIS — K220 Achalasia of cardia: Secondary | ICD-10-CM | POA: Diagnosis not present

## 2018-08-26 DIAGNOSIS — E538 Deficiency of other specified B group vitamins: Secondary | ICD-10-CM | POA: Diagnosis not present

## 2018-08-26 DIAGNOSIS — Z87891 Personal history of nicotine dependence: Secondary | ICD-10-CM | POA: Diagnosis not present

## 2018-08-26 DIAGNOSIS — Z7901 Long term (current) use of anticoagulants: Secondary | ICD-10-CM | POA: Diagnosis not present

## 2018-08-26 DIAGNOSIS — K219 Gastro-esophageal reflux disease without esophagitis: Secondary | ICD-10-CM | POA: Diagnosis not present

## 2018-08-26 DIAGNOSIS — G8929 Other chronic pain: Secondary | ICD-10-CM | POA: Diagnosis not present

## 2018-08-26 DIAGNOSIS — I872 Venous insufficiency (chronic) (peripheral): Secondary | ICD-10-CM | POA: Diagnosis not present

## 2018-08-26 DIAGNOSIS — R269 Unspecified abnormalities of gait and mobility: Secondary | ICD-10-CM | POA: Diagnosis not present

## 2018-08-26 DIAGNOSIS — I350 Nonrheumatic aortic (valve) stenosis: Secondary | ICD-10-CM | POA: Diagnosis not present

## 2018-08-26 DIAGNOSIS — I119 Hypertensive heart disease without heart failure: Secondary | ICD-10-CM | POA: Diagnosis not present

## 2018-08-26 DIAGNOSIS — K5730 Diverticulosis of large intestine without perforation or abscess without bleeding: Secondary | ICD-10-CM | POA: Diagnosis not present

## 2018-08-26 DIAGNOSIS — K5521 Angiodysplasia of colon with hemorrhage: Secondary | ICD-10-CM | POA: Diagnosis not present

## 2018-08-26 DIAGNOSIS — Z550 Illiteracy and low-level literacy: Secondary | ICD-10-CM | POA: Diagnosis not present

## 2018-08-26 DIAGNOSIS — D509 Iron deficiency anemia, unspecified: Secondary | ICD-10-CM | POA: Diagnosis not present

## 2018-08-26 DIAGNOSIS — K641 Second degree hemorrhoids: Secondary | ICD-10-CM | POA: Diagnosis not present

## 2018-08-26 NOTE — Telephone Encounter (Signed)
-----   Message from Willia Craze, NP sent at 08/24/2018  9:38 AM EST ----- [name], please get this man an appointment to see me in the office in a couple of weeks.  Thanks . Reason is in is for hospital follow-up

## 2018-08-26 NOTE — Telephone Encounter (Signed)
Spoke with the patient. He asks me to call back after 10:30 am and speak with his wife.

## 2018-08-27 ENCOUNTER — Encounter: Payer: Self-pay | Admitting: Gastroenterology

## 2018-08-27 ENCOUNTER — Telehealth: Payer: Self-pay

## 2018-08-27 DIAGNOSIS — K219 Gastro-esophageal reflux disease without esophagitis: Secondary | ICD-10-CM | POA: Diagnosis not present

## 2018-08-27 DIAGNOSIS — I872 Venous insufficiency (chronic) (peripheral): Secondary | ICD-10-CM | POA: Diagnosis not present

## 2018-08-27 DIAGNOSIS — G8929 Other chronic pain: Secondary | ICD-10-CM | POA: Diagnosis not present

## 2018-08-27 DIAGNOSIS — I119 Hypertensive heart disease without heart failure: Secondary | ICD-10-CM | POA: Diagnosis not present

## 2018-08-27 DIAGNOSIS — M48062 Spinal stenosis, lumbar region with neurogenic claudication: Secondary | ICD-10-CM | POA: Diagnosis not present

## 2018-08-27 DIAGNOSIS — K5521 Angiodysplasia of colon with hemorrhage: Secondary | ICD-10-CM | POA: Diagnosis not present

## 2018-08-27 DIAGNOSIS — K5900 Constipation, unspecified: Secondary | ICD-10-CM | POA: Diagnosis not present

## 2018-08-27 DIAGNOSIS — K5730 Diverticulosis of large intestine without perforation or abscess without bleeding: Secondary | ICD-10-CM | POA: Diagnosis not present

## 2018-08-27 DIAGNOSIS — G629 Polyneuropathy, unspecified: Secondary | ICD-10-CM | POA: Diagnosis not present

## 2018-08-27 DIAGNOSIS — K220 Achalasia of cardia: Secondary | ICD-10-CM | POA: Diagnosis not present

## 2018-08-27 DIAGNOSIS — K641 Second degree hemorrhoids: Secondary | ICD-10-CM | POA: Diagnosis not present

## 2018-08-27 DIAGNOSIS — D509 Iron deficiency anemia, unspecified: Secondary | ICD-10-CM | POA: Diagnosis not present

## 2018-08-27 DIAGNOSIS — Z550 Illiteracy and low-level literacy: Secondary | ICD-10-CM | POA: Diagnosis not present

## 2018-08-27 DIAGNOSIS — R269 Unspecified abnormalities of gait and mobility: Secondary | ICD-10-CM | POA: Diagnosis not present

## 2018-08-27 DIAGNOSIS — E538 Deficiency of other specified B group vitamins: Secondary | ICD-10-CM | POA: Diagnosis not present

## 2018-08-27 DIAGNOSIS — Z86718 Personal history of other venous thrombosis and embolism: Secondary | ICD-10-CM | POA: Diagnosis not present

## 2018-08-27 DIAGNOSIS — I350 Nonrheumatic aortic (valve) stenosis: Secondary | ICD-10-CM | POA: Diagnosis not present

## 2018-08-27 DIAGNOSIS — Z87891 Personal history of nicotine dependence: Secondary | ICD-10-CM | POA: Diagnosis not present

## 2018-08-27 DIAGNOSIS — M150 Primary generalized (osteo)arthritis: Secondary | ICD-10-CM | POA: Diagnosis not present

## 2018-08-27 DIAGNOSIS — Z7901 Long term (current) use of anticoagulants: Secondary | ICD-10-CM | POA: Diagnosis not present

## 2018-08-27 NOTE — Telephone Encounter (Signed)
Yes, I agree with home health physical therapy 1x week for 4 weeks for endurance, balance, safety, and strengthening.  Thank You.

## 2018-08-27 NOTE — Telephone Encounter (Signed)
HH PT 1x week for 4 weeks for endurance, balance, safety, strengthening. Do you agree to give VO?

## 2018-08-27 NOTE — Telephone Encounter (Signed)
[name]  with [facility] requesting VO for PT.

## 2018-08-29 DIAGNOSIS — M48062 Spinal stenosis, lumbar region with neurogenic claudication: Secondary | ICD-10-CM | POA: Diagnosis not present

## 2018-08-29 DIAGNOSIS — Z550 Illiteracy and low-level literacy: Secondary | ICD-10-CM | POA: Diagnosis not present

## 2018-08-29 DIAGNOSIS — K641 Second degree hemorrhoids: Secondary | ICD-10-CM | POA: Diagnosis not present

## 2018-08-29 DIAGNOSIS — K5521 Angiodysplasia of colon with hemorrhage: Secondary | ICD-10-CM | POA: Diagnosis not present

## 2018-08-29 DIAGNOSIS — I872 Venous insufficiency (chronic) (peripheral): Secondary | ICD-10-CM | POA: Diagnosis not present

## 2018-08-29 DIAGNOSIS — D509 Iron deficiency anemia, unspecified: Secondary | ICD-10-CM | POA: Diagnosis not present

## 2018-08-29 DIAGNOSIS — G629 Polyneuropathy, unspecified: Secondary | ICD-10-CM | POA: Diagnosis not present

## 2018-08-29 DIAGNOSIS — K5900 Constipation, unspecified: Secondary | ICD-10-CM | POA: Diagnosis not present

## 2018-08-29 DIAGNOSIS — I119 Hypertensive heart disease without heart failure: Secondary | ICD-10-CM | POA: Diagnosis not present

## 2018-08-29 DIAGNOSIS — K219 Gastro-esophageal reflux disease without esophagitis: Secondary | ICD-10-CM | POA: Diagnosis not present

## 2018-08-29 DIAGNOSIS — Z87891 Personal history of nicotine dependence: Secondary | ICD-10-CM | POA: Diagnosis not present

## 2018-08-29 DIAGNOSIS — K220 Achalasia of cardia: Secondary | ICD-10-CM | POA: Diagnosis not present

## 2018-08-29 DIAGNOSIS — K5730 Diverticulosis of large intestine without perforation or abscess without bleeding: Secondary | ICD-10-CM | POA: Diagnosis not present

## 2018-08-29 DIAGNOSIS — Z86718 Personal history of other venous thrombosis and embolism: Secondary | ICD-10-CM | POA: Diagnosis not present

## 2018-08-29 DIAGNOSIS — I350 Nonrheumatic aortic (valve) stenosis: Secondary | ICD-10-CM | POA: Diagnosis not present

## 2018-08-29 DIAGNOSIS — Z7901 Long term (current) use of anticoagulants: Secondary | ICD-10-CM | POA: Diagnosis not present

## 2018-08-29 DIAGNOSIS — E538 Deficiency of other specified B group vitamins: Secondary | ICD-10-CM | POA: Diagnosis not present

## 2018-08-29 DIAGNOSIS — R269 Unspecified abnormalities of gait and mobility: Secondary | ICD-10-CM | POA: Diagnosis not present

## 2018-08-29 DIAGNOSIS — G8929 Other chronic pain: Secondary | ICD-10-CM | POA: Diagnosis not present

## 2018-08-29 DIAGNOSIS — M150 Primary generalized (osteo)arthritis: Secondary | ICD-10-CM | POA: Diagnosis not present

## 2018-09-02 ENCOUNTER — Encounter: Payer: Self-pay | Admitting: Internal Medicine

## 2018-09-02 ENCOUNTER — Ambulatory Visit (INDEPENDENT_AMBULATORY_CARE_PROVIDER_SITE_OTHER): Payer: Medicare Other | Admitting: Internal Medicine

## 2018-09-02 ENCOUNTER — Other Ambulatory Visit: Payer: Self-pay

## 2018-09-02 VITALS — BP 98/37 | HR 74 | Temp 98.1°F | Ht 73.0 in | Wt 297.0 lb

## 2018-09-02 DIAGNOSIS — I10 Essential (primary) hypertension: Secondary | ICD-10-CM | POA: Diagnosis not present

## 2018-09-02 DIAGNOSIS — Z86718 Personal history of other venous thrombosis and embolism: Secondary | ICD-10-CM

## 2018-09-02 DIAGNOSIS — Z720 Tobacco use: Secondary | ICD-10-CM | POA: Diagnosis not present

## 2018-09-02 DIAGNOSIS — Z79899 Other long term (current) drug therapy: Secondary | ICD-10-CM

## 2018-09-02 DIAGNOSIS — E8770 Fluid overload, unspecified: Secondary | ICD-10-CM | POA: Insufficient documentation

## 2018-09-02 DIAGNOSIS — Z7901 Long term (current) use of anticoagulants: Secondary | ICD-10-CM

## 2018-09-02 DIAGNOSIS — K5520 Angiodysplasia of colon without hemorrhage: Secondary | ICD-10-CM | POA: Diagnosis not present

## 2018-09-02 DIAGNOSIS — R42 Dizziness and giddiness: Secondary | ICD-10-CM

## 2018-09-02 DIAGNOSIS — R0602 Shortness of breath: Secondary | ICD-10-CM | POA: Diagnosis not present

## 2018-09-02 DIAGNOSIS — I82401 Acute embolism and thrombosis of unspecified deep veins of right lower extremity: Secondary | ICD-10-CM

## 2018-09-02 LAB — BASIC METABOLIC PANEL
Anion gap: 8 (ref 5–15)
BUN: 17 mg/dL (ref 8–23)
CO2: 28 mmol/L (ref 22–32)
Calcium: 9.4 mg/dL (ref 8.9–10.3)
Chloride: 106 mmol/L (ref 98–111)
Creatinine, Ser: 1.31 mg/dL — ABNORMAL HIGH (ref 0.61–1.24)
GFR calc Af Amer: >60 mL/min (ref >60)
GFR calc non Af Amer: 53 mL/min — ABNORMAL LOW (ref >60)
Glucose, Bld: 75 mg/dL (ref 70–99)
Potassium: 3.9 mmol/L (ref 3.5–5.1)
Sodium: 142 mmol/L (ref 135–145)

## 2018-09-02 LAB — CBC
HCT: 29 % — ABNORMAL LOW (ref 39.0–52.0)
Hemoglobin: 8.5 g/dL — ABNORMAL LOW (ref 13.0–17.0)
MCH: 27.5 pg (ref 26.0–34.0)
MCHC: 29.3 g/dL — ABNORMAL LOW (ref 30.0–36.0)
MCV: 93.9 fL (ref 80.0–100.0)
Platelets: 220 10*3/uL (ref 150–400)
RBC: 3.09 MIL/uL — ABNORMAL LOW (ref 4.22–5.81)
RDW: 18.9 % — ABNORMAL HIGH (ref 11.5–15.5)
WBC: 6.9 10*3/uL (ref 4.0–10.5)
nRBC: 0 % (ref 0.0–0.2)

## 2018-09-02 NOTE — Assessment & Plan Note (Signed)
Symptoms of SOB and dizziness and "feeling drunk" similar to when he was recently admitted. SOB occurs with standing and on walking short distances. No chest pain, numbness or tingling, cough, or sore throat. He does have edema in both LE, right more than left consistent with history of DVT. Elevating his legs does help but does not relieve swellling in the LE. Orthostatics wnl but initial O2 saturation on standing dropped to 88% and CBC shows improving anemia. Ambulation with normal oxygen saturation until sitting he dropped to 88%. His symptoms may be residual from his improving anemia and he has f/u with GI on 3/20. He has not had an echo in two years. He smokes several cigarettes per day and is requesting an albuterol inhaler which he states has helped him in the past. Previous chest xray did show interstitial markings consistent with smoking history.   - PFTs - echo ordered  - CBC and BMP - f/u one week

## 2018-09-02 NOTE — Assessment & Plan Note (Addendum)
BP Readings from Last 3 Encounters:  09/02/18 (!) 98/37  08/25/18 (!) 125/59  08/21/18 (!) 119/59   Blood pressure held during admission due to being on the lower side. Restarted at discharge with request for f/u bmp. Blood pressure continues to be on the lower side although repeat with orthostatics was consistently within normal limits, around 133/67 with sitting, standing, and 3 minutes of standing. He continues to have SOB and dizziness which he states is similar to hospitalization.   - cont. Blood pressure medications HCTZ and verapamil - BMP today significant for Cr 1.3 which is close to recent elevated baseline

## 2018-09-02 NOTE — Progress Notes (Signed)
   CC: hospital follow-up GI bleed  HPI:  [name] is a 73 y.o. with PMH as below.   Please see A&P for assessment of the patient's acute and chronic medical conditions.  Past Medical History:  Diagnosis Date  . 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  Excellent symptomatic response to LES Botox injections.   . Anemia 08/20/2018  . AVM (arteriovenous malformation) of colon 08/26/2013   Cecum X 3, without bleeding on colonoscopy February 2015   . B12 deficiency 10/01/2011   Discovered to 2 progressive neurologic pain and dysfunction.  Diagnosis established March 2013.  Requires the following treatment: B12 IM monthly until level normalizes. After that, oral supplementation.   . Cataract   . Chronic venous insufficiency 11/28/2013  . Constipation due to opioid therapy 03/11/2015  . Deep venous thrombosis ([place]) 05/31/2018   Right lower extremity, unprovoked  . Diverticulosis 08/26/2013  . Essential hypertension 06/15/2006  . Family history of malignant neoplasm of gastrointestinal tract 06/30/2013   Father with colon cancer age 50   . Gastroesophageal reflux disease 02/04/2007  . GI bleeding 08/21/2018  . Internal hemorrhoids 08/26/2013  . Left posterior subcapsular cataract 10/03/2013   s/p yag laser capsulotomy 10/03/2013   . 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  Complicated by chronic low back pain and neuropathy.  Nerve conduction study (10/19/11): Diffusely low motor amplitudes, with primarily involvement of the peroneal and posterior tibial nerves. No evidence of generalized peripheral neuropathy. Findings could be c/w bil multilevel lumbosacral radiculopathies or a primary motor neuropathy.   . Mild aortic valve stenosis 10/09/2006   Echo  (12/09/2009): Valve area: 2.03 cm2   . Neuropathy   . Obesity, Class I, BMI 30-34.9 04/05/2012  . Onychomycosis of toenail 06/05/2014  . Osteoarthritis 11/25/2009   Right knee (medial compartment), right hip, left shoulder   . 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 Trigger little finger of right hand 03/11/2015   Review of Systems:   Review of Systems  Constitutional: Positive for malaise/fatigue. Negative for chills and fever.  Respiratory: Positive for shortness of breath. Negative for cough.   Gastrointestinal: Negative for constipation, diarrhea, nausea and vomiting.  Neurological: Positive for dizziness. Negative for tingling and headaches.   Physical Exam:  Constitution: NAD, obese HENT: Eyes: Cardio: RRR, no m/r/g  Respiratory: CTA, no wheezing rales or rhonchi  MSK: bilateral +1 edema, right more than left, calves NTTP, sensation intact, no redness or erythema Neuro: a&o, normal affect Skin: c/d/i    Vitals:   09/02/18 0929  BP: (!) 98/37  Pulse: 74  Temp: 98.1 F (36.7 C)  TempSrc: Oral  SpO2: 100%  Weight: 297 lb (134.7 kg)  Height: 6\' 1"  (1.854 m)     Assessment & Plan:   See Encounters Tab for problem based charting.  Patient discussed with Dr. Rebeca Alert

## 2018-09-02 NOTE — Patient Instructions (Signed)
Thank you for allowing [place] to provide your care today. Today we discussed your shortness of breath.      Today we made the following changes to your medications:   Please START taking  Albuterol (Ventolin HFA) 1 puff two times per day as needed for shortness of breath.   Please have your pulmonary function test and ECHO done before follow-up if possible.   Please follow-up in one week.    Should you have any questions or concerns please call the internal medicine clinic at [phone].

## 2018-09-02 NOTE — Assessment & Plan Note (Signed)
Recently admitted for symptomatic anemia 2/2 xeralto use for DVT and colonic and EGD angiectasia seen on colonoscopy while admitted. Pt had poor prep and will require repeat colonoscopy, he has follow-up appointment with gastroenterology 3/20. With unprovoked DVT it was advised he restart his xarelto 15mg  bid for 21 days (complete on the 11th), then will switch to 20mg  once daily. He denies black stools although does continue to have SOB and dizziness and similar symptoms as to when he was hospitalized recently. Stat CBC today shows Hgb 8.5, improved from 7.7 at discharge 3/1.   - two more days 15mg  bid xarelto, then 20 mg once per day - f/u GI 09/12/2017  - cont. Iron

## 2018-09-03 ENCOUNTER — Ambulatory Visit (INDEPENDENT_AMBULATORY_CARE_PROVIDER_SITE_OTHER): Payer: Medicare Other | Admitting: Podiatry

## 2018-09-03 ENCOUNTER — Encounter: Payer: Self-pay | Admitting: Podiatry

## 2018-09-03 DIAGNOSIS — B351 Tinea unguium: Secondary | ICD-10-CM

## 2018-09-03 DIAGNOSIS — M79675 Pain in left toe(s): Secondary | ICD-10-CM

## 2018-09-03 DIAGNOSIS — M79674 Pain in right toe(s): Secondary | ICD-10-CM | POA: Diagnosis not present

## 2018-09-03 DIAGNOSIS — D689 Coagulation defect, unspecified: Secondary | ICD-10-CM

## 2018-09-03 DIAGNOSIS — Q828 Other specified congenital malformations of skin: Secondary | ICD-10-CM

## 2018-09-03 NOTE — Progress Notes (Signed)
Complaint:  Visit Type: Patient returns to my office for continued preventative foot care services. Complaint: Patient states" my nails have grown long and thick and become painful to walk and wear shoes".  Patient has multiple calluses both feet. Patient has been diagnosed with DM with no foot complications. The patient presents for preventative foot care services. No changes to ROS  Podiatric Exam: Vascular: dorsalis pedis and posterior tibial pulses are weakly palpable bilateral. Capillary return is immediate. Temperature gradient is WNL. Skin turgor WNL  Sensorium: Normal Semmes Weinstein monofilament test. Normal tactile sensation bilaterally. Nail Exam: Pt has thick disfigured discolored nails with subungual debris noted bilateral entire nail hallux through fifth toenails Ulcer Exam: There is no evidence of ulcer or pre-ulcerative changes or infection. Orthopedic Exam: Muscle tone and strength are WNL. No limitations in general ROM. No crepitus or effusions noted. Foot type and digits show no abnormalities. Bony prominences are unremarkable. Pes planus foot type. Skin: No Porokeratosis. No infection or ulcers.  Multiple porokeratosis  Greater than 5.  Diagnosis:  Onychomycosis, , Pain in right toe, pain in left toes  Porokeratosis   Treatment & Plan Procedures and Treatment: Consent by patient was obtained for treatment procedures.   Debridement of mycotic and hypertrophic toenails, 1 through 5 bilateral and clearing of subungual debris. No ulceration, no infection noted. Debride porokeratosis  Return Visit-Office Procedure: Patient instructed to return to the office for a follow up visit 10 weeks for continued evaluation and treatment.    Gardiner Barefoot DPM

## 2018-09-04 ENCOUNTER — Telehealth: Payer: Self-pay | Admitting: *Deleted

## 2018-09-04 NOTE — Telephone Encounter (Signed)
[place] calls for VO 2x week for 2 weeks 1x week for 2 weeks Disease management and medication management Do you agree?

## 2018-09-05 ENCOUNTER — Telehealth: Payer: Self-pay | Admitting: *Deleted

## 2018-09-05 DIAGNOSIS — Z7901 Long term (current) use of anticoagulants: Secondary | ICD-10-CM | POA: Diagnosis not present

## 2018-09-05 DIAGNOSIS — G8929 Other chronic pain: Secondary | ICD-10-CM | POA: Diagnosis not present

## 2018-09-05 DIAGNOSIS — M150 Primary generalized (osteo)arthritis: Secondary | ICD-10-CM | POA: Diagnosis not present

## 2018-09-05 DIAGNOSIS — K5900 Constipation, unspecified: Secondary | ICD-10-CM | POA: Diagnosis not present

## 2018-09-05 DIAGNOSIS — I119 Hypertensive heart disease without heart failure: Secondary | ICD-10-CM | POA: Diagnosis not present

## 2018-09-05 DIAGNOSIS — E538 Deficiency of other specified B group vitamins: Secondary | ICD-10-CM | POA: Diagnosis not present

## 2018-09-05 DIAGNOSIS — D509 Iron deficiency anemia, unspecified: Secondary | ICD-10-CM | POA: Diagnosis not present

## 2018-09-05 DIAGNOSIS — I350 Nonrheumatic aortic (valve) stenosis: Secondary | ICD-10-CM | POA: Diagnosis not present

## 2018-09-05 DIAGNOSIS — K5730 Diverticulosis of large intestine without perforation or abscess without bleeding: Secondary | ICD-10-CM | POA: Diagnosis not present

## 2018-09-05 DIAGNOSIS — Z86718 Personal history of other venous thrombosis and embolism: Secondary | ICD-10-CM | POA: Diagnosis not present

## 2018-09-05 DIAGNOSIS — K641 Second degree hemorrhoids: Secondary | ICD-10-CM | POA: Diagnosis not present

## 2018-09-05 DIAGNOSIS — M48062 Spinal stenosis, lumbar region with neurogenic claudication: Secondary | ICD-10-CM | POA: Diagnosis not present

## 2018-09-05 DIAGNOSIS — I872 Venous insufficiency (chronic) (peripheral): Secondary | ICD-10-CM | POA: Diagnosis not present

## 2018-09-05 DIAGNOSIS — Z87891 Personal history of nicotine dependence: Secondary | ICD-10-CM | POA: Diagnosis not present

## 2018-09-05 DIAGNOSIS — K219 Gastro-esophageal reflux disease without esophagitis: Secondary | ICD-10-CM | POA: Diagnosis not present

## 2018-09-05 DIAGNOSIS — G629 Polyneuropathy, unspecified: Secondary | ICD-10-CM | POA: Diagnosis not present

## 2018-09-05 DIAGNOSIS — R269 Unspecified abnormalities of gait and mobility: Secondary | ICD-10-CM | POA: Diagnosis not present

## 2018-09-05 DIAGNOSIS — Z550 Illiteracy and low-level literacy: Secondary | ICD-10-CM | POA: Diagnosis not present

## 2018-09-05 DIAGNOSIS — K220 Achalasia of cardia: Secondary | ICD-10-CM | POA: Diagnosis not present

## 2018-09-05 DIAGNOSIS — K5521 Angiodysplasia of colon with hemorrhage: Secondary | ICD-10-CM | POA: Diagnosis not present

## 2018-09-05 NOTE — Telephone Encounter (Signed)
[place] PT calls and states pt is "different" today from last week. She states he is having more labored resp effort, 02 sat 95%. He states he is tired and feels bad, color is dull. He states he feels like he did before "they found the bleed" He desires to be seen in clinic, appt given for 3/13 at 1015, [facility]. PT has instructed pt to call 911 if he feels worse or has short breath, chest pain, dizziness, noted active bleeding, changes in vision, gait.

## 2018-09-05 NOTE — Telephone Encounter (Signed)
Yes.  Exactly as outlined in your note.  Thank You.

## 2018-09-06 ENCOUNTER — Ambulatory Visit (HOSPITAL_COMMUNITY)
Admission: RE | Admit: 2018-09-06 | Discharge: 2018-09-06 | Disposition: A | Discharge status: Home / Self Care | Payer: Medicare Other | Source: Ambulatory Visit | Attending: Internal Medicine | Admitting: Internal Medicine

## 2018-09-06 ENCOUNTER — Encounter: Payer: Self-pay | Admitting: Internal Medicine

## 2018-09-06 ENCOUNTER — Other Ambulatory Visit: Payer: Self-pay

## 2018-09-06 ENCOUNTER — Ambulatory Visit (INDEPENDENT_AMBULATORY_CARE_PROVIDER_SITE_OTHER): Payer: Medicare Other | Admitting: Internal Medicine

## 2018-09-06 VITALS — BP 110/69 | HR 68 | Temp 98.1°F | Ht 73.0 in | Wt 301.1 lb

## 2018-09-06 DIAGNOSIS — I082 Rheumatic disorders of both aortic and tricuspid valves: Secondary | ICD-10-CM | POA: Diagnosis not present

## 2018-09-06 DIAGNOSIS — R0600 Dyspnea, unspecified: Secondary | ICD-10-CM | POA: Diagnosis not present

## 2018-09-06 DIAGNOSIS — M7989 Other specified soft tissue disorders: Secondary | ICD-10-CM

## 2018-09-06 DIAGNOSIS — Z87891 Personal history of nicotine dependence: Secondary | ICD-10-CM | POA: Diagnosis not present

## 2018-09-06 DIAGNOSIS — E8770 Fluid overload, unspecified: Secondary | ICD-10-CM

## 2018-09-06 DIAGNOSIS — I313 Pericardial effusion (noninflammatory): Secondary | ICD-10-CM | POA: Diagnosis not present

## 2018-09-06 DIAGNOSIS — I10 Essential (primary) hypertension: Secondary | ICD-10-CM | POA: Insufficient documentation

## 2018-09-06 DIAGNOSIS — R0602 Shortness of breath: Secondary | ICD-10-CM | POA: Insufficient documentation

## 2018-09-06 DIAGNOSIS — Z79899 Other long term (current) drug therapy: Secondary | ICD-10-CM | POA: Diagnosis not present

## 2018-09-06 MED ORDER — FUROSEMIDE 40 MG PO TABS: 40.0000 mg | ORAL_TABLET | Freq: Every day | ORAL | 2 refills | Status: DC

## 2018-09-06 NOTE — Patient Instructions (Addendum)
Thank you for allowing [place] to provide your care today. Today we discussed your shortness of breath and lower extremity swelling.     Today we made the following changes to your medications:   Please START taking  Lasix (FUROSEMIDE) 40 MG tablet - take 1 tablet two times per day for four days, THEN Take 1 tablet in the morning every day.   Please weigh yourself every morning. If you notice that your weight continues to increase more than 5lbs, please call the clinic for an appointment. If you become severely short of breath, please go to the emergency room.   Please STOP taking   Hydrochlorothiazide (HYDRODIURIL) 25 MG tablet   Please follow-up next Tuesday or Wednesday.     Should you have any questions or concerns please call the internal medicine clinic at [phone].

## 2018-09-06 NOTE — Assessment & Plan Note (Signed)
BP Readings from Last 3 Encounters:  09/06/18 110/69  09/02/18 (!) 98/37  08/25/18 (!) 125/59   [name] continues to have symptoms of shortness of breath and dyspnea on exertion as he did prior to last hospital admission. Since hospital discharge, his weight has increased from 279 to 301 and he has increased LE swelling from last week and feels bloated. He states he has been "eating a lot of potato chips." He is on verapamil and HCTZ 25 mg qd. ECHO was done this morning but has not yet been read.   - start lasix 40 mg bid for four days, then decrease to 40 mg qd  - educated on low salt diet and provided information handout  - stop HCTZ - f/u five days for BMP and CBC - instructed to weigh himself daily and bring information at f/u  - go to the ED for increasing SOB or >5lb weight gain from current weight

## 2018-09-06 NOTE — Progress Notes (Signed)
  Echocardiogram 2D Echocardiogram has been performed.  [name] 09/06/2018, 8:52 AM

## 2018-09-06 NOTE — Progress Notes (Signed)
   CC: shortness of breath  HPI:  [name] is a 73 y.o. with PMH as below.   Please see A&P for assessment of the patient's acute and chronic medical conditions.   Past Medical History:  Diagnosis Date  . 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  Excellent symptomatic response to LES Botox injections.   . Anemia 08/20/2018  . AVM (arteriovenous malformation) of colon 08/26/2013   Cecum X 3, without bleeding on colonoscopy February 2015   . B12 deficiency 10/01/2011   Discovered to 2 progressive neurologic pain and dysfunction.  Diagnosis established March 2013.  Requires the following treatment: B12 IM monthly until level normalizes. After that, oral supplementation.   . Cataract   . Chronic venous insufficiency 11/28/2013  . Constipation due to opioid therapy 03/11/2015  . Deep venous thrombosis ([place]) 05/31/2018   Right lower extremity, unprovoked  . Diverticulosis 08/26/2013  . Essential hypertension 06/15/2006  . Family history of malignant neoplasm of gastrointestinal tract 06/30/2013   Father with colon cancer age 49   . Gastroesophageal reflux disease 02/04/2007  . GI bleeding 08/21/2018  . Internal hemorrhoids 08/26/2013  . Left posterior subcapsular cataract 10/03/2013   s/p yag laser capsulotomy 10/03/2013   . 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  Complicated by chronic low back pain and neuropathy.  Nerve conduction study (10/19/11): Diffusely low motor amplitudes, with primarily involvement of the peroneal and posterior tibial nerves. No evidence of generalized peripheral neuropathy. Findings could be c/w bil multilevel lumbosacral radiculopathies or a primary motor neuropathy.   . Mild aortic valve stenosis 10/09/2006   Echo  (12/09/2009): Valve area: 2.03 cm2   . Neuropathy   . Obesity, Class I, BMI 30-34.9 04/05/2012  . Onychomycosis of toenail 06/05/2014  . Osteoarthritis 11/25/2009   Right knee (medial compartment), right hip, left shoulder   . 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 Trigger little finger of right hand 03/11/2015   Review of Systems:   Review of Systems  Constitutional: Negative for diaphoresis.  HENT: Negative for congestion and sore throat.   Respiratory: Positive for shortness of breath. Negative for cough, hemoptysis, sputum production and wheezing.   Cardiovascular: Positive for orthopnea and leg swelling. Negative for chest pain and palpitations.  Gastrointestinal: Negative for blood in stool, constipation, diarrhea, heartburn, melena, nausea and vomiting.  Genitourinary: Negative for dysuria, frequency and urgency.   Physical Exam:  Constitution: NAD, appears stated age HENT: [place]/AT Cardio: RRR, no m/r/g  Respiratory: decreased breath sounds lower bases, otherwise CTA  Abdominal: NTTP, mildly distended, soft, +BS MSK: +3 pitting edema bilaterally, moving all extremities Neuro: a&ox3, normal affect  Skin: c/d/i    There were no vitals filed for this visit.   Assessment & Plan:   See Encounters Tab for problem based charting.  Patient discussed with Dr. Angelia Mould

## 2018-09-06 NOTE — Progress Notes (Signed)
Internal Medicine Clinic Attending  Case discussed with Dr. Sharon Seller at the time of the visit.  We reviewed the resident's history and exam and pertinent patient test results.  I agree with the assessment, diagnosis, and plan of care documented in the resident's note.  Will follow up on results of TTE and PFTs. If still having dizziness, consider reducing BP meds at next visit.   Lenice Pressman, M.D., Ph.D.

## 2018-09-07 DIAGNOSIS — K5730 Diverticulosis of large intestine without perforation or abscess without bleeding: Secondary | ICD-10-CM | POA: Diagnosis not present

## 2018-09-07 DIAGNOSIS — K5900 Constipation, unspecified: Secondary | ICD-10-CM | POA: Diagnosis not present

## 2018-09-07 DIAGNOSIS — Z550 Illiteracy and low-level literacy: Secondary | ICD-10-CM | POA: Diagnosis not present

## 2018-09-07 DIAGNOSIS — Z7901 Long term (current) use of anticoagulants: Secondary | ICD-10-CM | POA: Diagnosis not present

## 2018-09-07 DIAGNOSIS — Z86718 Personal history of other venous thrombosis and embolism: Secondary | ICD-10-CM | POA: Diagnosis not present

## 2018-09-07 DIAGNOSIS — G8929 Other chronic pain: Secondary | ICD-10-CM | POA: Diagnosis not present

## 2018-09-07 DIAGNOSIS — E538 Deficiency of other specified B group vitamins: Secondary | ICD-10-CM | POA: Diagnosis not present

## 2018-09-07 DIAGNOSIS — D509 Iron deficiency anemia, unspecified: Secondary | ICD-10-CM | POA: Diagnosis not present

## 2018-09-07 DIAGNOSIS — M150 Primary generalized (osteo)arthritis: Secondary | ICD-10-CM | POA: Diagnosis not present

## 2018-09-07 DIAGNOSIS — I119 Hypertensive heart disease without heart failure: Secondary | ICD-10-CM | POA: Diagnosis not present

## 2018-09-07 DIAGNOSIS — M48062 Spinal stenosis, lumbar region with neurogenic claudication: Secondary | ICD-10-CM | POA: Diagnosis not present

## 2018-09-07 DIAGNOSIS — K219 Gastro-esophageal reflux disease without esophagitis: Secondary | ICD-10-CM | POA: Diagnosis not present

## 2018-09-07 DIAGNOSIS — Z87891 Personal history of nicotine dependence: Secondary | ICD-10-CM | POA: Diagnosis not present

## 2018-09-07 DIAGNOSIS — R269 Unspecified abnormalities of gait and mobility: Secondary | ICD-10-CM | POA: Diagnosis not present

## 2018-09-07 DIAGNOSIS — G629 Polyneuropathy, unspecified: Secondary | ICD-10-CM | POA: Diagnosis not present

## 2018-09-07 DIAGNOSIS — K5521 Angiodysplasia of colon with hemorrhage: Secondary | ICD-10-CM | POA: Diagnosis not present

## 2018-09-07 DIAGNOSIS — K220 Achalasia of cardia: Secondary | ICD-10-CM | POA: Diagnosis not present

## 2018-09-07 DIAGNOSIS — K641 Second degree hemorrhoids: Secondary | ICD-10-CM | POA: Diagnosis not present

## 2018-09-07 DIAGNOSIS — I350 Nonrheumatic aortic (valve) stenosis: Secondary | ICD-10-CM | POA: Diagnosis not present

## 2018-09-07 DIAGNOSIS — I872 Venous insufficiency (chronic) (peripheral): Secondary | ICD-10-CM | POA: Diagnosis not present

## 2018-09-09 NOTE — Progress Notes (Signed)
Internal Medicine Clinic Attending  Case discussed with Dr. Seawell at the time of the visit.  We reviewed the resident's history and exam and pertinent patient test results.  I agree with the assessment, diagnosis, and plan of care documented in the resident's note.    

## 2018-09-10 NOTE — Addendum Note (Signed)
Addended by: Molli Hazard A on: 09/10/2018 09:25 AM   Modules accepted: Level of Service

## 2018-09-11 ENCOUNTER — Ambulatory Visit: Payer: Medicare Other

## 2018-09-11 DIAGNOSIS — Z87891 Personal history of nicotine dependence: Secondary | ICD-10-CM | POA: Diagnosis not present

## 2018-09-11 DIAGNOSIS — Z7901 Long term (current) use of anticoagulants: Secondary | ICD-10-CM | POA: Diagnosis not present

## 2018-09-11 DIAGNOSIS — K5730 Diverticulosis of large intestine without perforation or abscess without bleeding: Secondary | ICD-10-CM | POA: Diagnosis not present

## 2018-09-11 DIAGNOSIS — Z550 Illiteracy and low-level literacy: Secondary | ICD-10-CM | POA: Diagnosis not present

## 2018-09-11 DIAGNOSIS — Z86718 Personal history of other venous thrombosis and embolism: Secondary | ICD-10-CM | POA: Diagnosis not present

## 2018-09-11 DIAGNOSIS — M150 Primary generalized (osteo)arthritis: Secondary | ICD-10-CM | POA: Diagnosis not present

## 2018-09-11 DIAGNOSIS — I350 Nonrheumatic aortic (valve) stenosis: Secondary | ICD-10-CM | POA: Diagnosis not present

## 2018-09-11 DIAGNOSIS — K220 Achalasia of cardia: Secondary | ICD-10-CM | POA: Diagnosis not present

## 2018-09-11 DIAGNOSIS — G8929 Other chronic pain: Secondary | ICD-10-CM | POA: Diagnosis not present

## 2018-09-11 DIAGNOSIS — G629 Polyneuropathy, unspecified: Secondary | ICD-10-CM | POA: Diagnosis not present

## 2018-09-11 DIAGNOSIS — D509 Iron deficiency anemia, unspecified: Secondary | ICD-10-CM | POA: Diagnosis not present

## 2018-09-11 DIAGNOSIS — E538 Deficiency of other specified B group vitamins: Secondary | ICD-10-CM | POA: Diagnosis not present

## 2018-09-11 DIAGNOSIS — K219 Gastro-esophageal reflux disease without esophagitis: Secondary | ICD-10-CM | POA: Diagnosis not present

## 2018-09-11 DIAGNOSIS — I119 Hypertensive heart disease without heart failure: Secondary | ICD-10-CM | POA: Diagnosis not present

## 2018-09-11 DIAGNOSIS — K641 Second degree hemorrhoids: Secondary | ICD-10-CM | POA: Diagnosis not present

## 2018-09-11 DIAGNOSIS — I872 Venous insufficiency (chronic) (peripheral): Secondary | ICD-10-CM | POA: Diagnosis not present

## 2018-09-11 DIAGNOSIS — R269 Unspecified abnormalities of gait and mobility: Secondary | ICD-10-CM | POA: Diagnosis not present

## 2018-09-11 DIAGNOSIS — K5521 Angiodysplasia of colon with hemorrhage: Secondary | ICD-10-CM | POA: Diagnosis not present

## 2018-09-11 DIAGNOSIS — M48062 Spinal stenosis, lumbar region with neurogenic claudication: Secondary | ICD-10-CM | POA: Diagnosis not present

## 2018-09-11 DIAGNOSIS — K5900 Constipation, unspecified: Secondary | ICD-10-CM | POA: Diagnosis not present

## 2018-09-12 ENCOUNTER — Telehealth: Payer: Self-pay

## 2018-09-12 NOTE — Telephone Encounter (Signed)
Covid-19 travel screening questions  Have you traveled in the last 14 days? If yes where?  Do you now or have you had a fever in the last 14 days?  Do you have any respiratory symptoms of shortness of breath or cough now or in the last 14 days?  Do you have a medical history of Congestive Heart Failure?  Do you have a medical history of lung disease?  Do you have any family members or close contacts with diagnosed or suspected Covid-19?   No answer at any of the listed phone numbers.

## 2018-09-13 ENCOUNTER — Ambulatory Visit (INDEPENDENT_AMBULATORY_CARE_PROVIDER_SITE_OTHER): Payer: Medicare Other | Admitting: Nurse Practitioner

## 2018-09-13 ENCOUNTER — Other Ambulatory Visit (INDEPENDENT_AMBULATORY_CARE_PROVIDER_SITE_OTHER): Payer: Medicare Other

## 2018-09-13 ENCOUNTER — Encounter: Payer: Self-pay | Admitting: Nurse Practitioner

## 2018-09-13 ENCOUNTER — Other Ambulatory Visit: Payer: Self-pay

## 2018-09-13 ENCOUNTER — Telehealth: Payer: Self-pay

## 2018-09-13 VITALS — BP 120/60 | HR 115 | Temp 98.1°F | Ht 71.75 in | Wt 283.1 lb

## 2018-09-13 DIAGNOSIS — G8929 Other chronic pain: Secondary | ICD-10-CM | POA: Diagnosis not present

## 2018-09-13 DIAGNOSIS — I350 Nonrheumatic aortic (valve) stenosis: Secondary | ICD-10-CM | POA: Diagnosis not present

## 2018-09-13 DIAGNOSIS — E538 Deficiency of other specified B group vitamins: Secondary | ICD-10-CM | POA: Diagnosis not present

## 2018-09-13 DIAGNOSIS — Z550 Illiteracy and low-level literacy: Secondary | ICD-10-CM | POA: Diagnosis not present

## 2018-09-13 DIAGNOSIS — D509 Iron deficiency anemia, unspecified: Secondary | ICD-10-CM

## 2018-09-13 DIAGNOSIS — M48062 Spinal stenosis, lumbar region with neurogenic claudication: Secondary | ICD-10-CM | POA: Diagnosis not present

## 2018-09-13 DIAGNOSIS — K5730 Diverticulosis of large intestine without perforation or abscess without bleeding: Secondary | ICD-10-CM | POA: Diagnosis not present

## 2018-09-13 DIAGNOSIS — K31819 Angiodysplasia of stomach and duodenum without bleeding: Secondary | ICD-10-CM

## 2018-09-13 DIAGNOSIS — K5900 Constipation, unspecified: Secondary | ICD-10-CM | POA: Diagnosis not present

## 2018-09-13 DIAGNOSIS — Z87891 Personal history of nicotine dependence: Secondary | ICD-10-CM | POA: Diagnosis not present

## 2018-09-13 DIAGNOSIS — I872 Venous insufficiency (chronic) (peripheral): Secondary | ICD-10-CM | POA: Diagnosis not present

## 2018-09-13 DIAGNOSIS — M150 Primary generalized (osteo)arthritis: Secondary | ICD-10-CM | POA: Diagnosis not present

## 2018-09-13 DIAGNOSIS — Z7901 Long term (current) use of anticoagulants: Secondary | ICD-10-CM | POA: Diagnosis not present

## 2018-09-13 DIAGNOSIS — I119 Hypertensive heart disease without heart failure: Secondary | ICD-10-CM | POA: Diagnosis not present

## 2018-09-13 DIAGNOSIS — R269 Unspecified abnormalities of gait and mobility: Secondary | ICD-10-CM | POA: Diagnosis not present

## 2018-09-13 DIAGNOSIS — Z86718 Personal history of other venous thrombosis and embolism: Secondary | ICD-10-CM | POA: Diagnosis not present

## 2018-09-13 DIAGNOSIS — G629 Polyneuropathy, unspecified: Secondary | ICD-10-CM | POA: Diagnosis not present

## 2018-09-13 DIAGNOSIS — K5521 Angiodysplasia of colon with hemorrhage: Secondary | ICD-10-CM | POA: Diagnosis not present

## 2018-09-13 DIAGNOSIS — K641 Second degree hemorrhoids: Secondary | ICD-10-CM | POA: Diagnosis not present

## 2018-09-13 DIAGNOSIS — K220 Achalasia of cardia: Secondary | ICD-10-CM | POA: Diagnosis not present

## 2018-09-13 DIAGNOSIS — K219 Gastro-esophageal reflux disease without esophagitis: Secondary | ICD-10-CM | POA: Diagnosis not present

## 2018-09-13 LAB — CBC
HCT: 26.9 % — ABNORMAL LOW (ref 39.0–52.0)
Hemoglobin: 8.7 g/dL — ABNORMAL LOW (ref 13.0–17.0)
MCHC: 32.3 g/dL (ref 30.0–36.0)
MCV: 86.7 fl (ref 78.0–100.0)
Platelets: 358 10*3/uL (ref 150.0–400.0)
RBC: 3.1 Mil/uL — ABNORMAL LOW (ref 4.22–5.81)
RDW: 19.5 % — ABNORMAL HIGH (ref 11.5–15.5)
WBC: 6.1 10*3/uL (ref 4.0–10.5)

## 2018-09-13 MED ORDER — NA SULFATE-K SULFATE-MG SULF 17.5-3.13-1.6 GM/177ML PO SOLN: ORAL | 0 refills | Status: DC

## 2018-09-13 NOTE — Telephone Encounter (Signed)
[facility] Pre-operative Risk Assessment     Request for surgical clearance:     Endoscopy Procedure  What type of surgery is being performed?     Colonoscopy  When is this surgery scheduled?     10/10/18  What type of clearance is required ?   Pharmacy  Are there any medications that need to be held prior to surgery and how long? [place] TWO DAYS PRIOR  Practice name and name of physician performing surgery?      [facility]/Dr. Loletha Carrow  What is your office phone and fax number?      Phone- [phone]  [phone]  Anesthesia type (None, local, MAC, general) ?       MAC  DR Virgina Jock PLEASE ADVISE IF OKAY TO HOLD XARELTO TWO DAYS PRIOR TO PROCEDURE. PLEASE FAX BACK TO :  [phone]  ATTN: Sruthi Maurer, RMA   THANK YOU.

## 2018-09-13 NOTE — Patient Instructions (Signed)
If you are age 75 or older, your body mass index should be between 23-30. Your Body mass index is 38.67 kg/m. If this is out of the aforementioned range listed, please consider follow up with your Primary Care Provider.  If you are age 32 or younger, your body mass index should be between 19-25. Your Body mass index is 38.67 kg/m. If this is out of the aformentioned range listed, please consider follow up with your Primary Care Provider.   You have been scheduled for a colonoscopy. Please follow written instructions given to you at your visit today.  Please pick up your prep supplies at the pharmacy within the next 1-3 days. If you use inhalers (even only as needed), please bring them with you on the day of your procedure. Your physician has requested that you go to www.startemmi.com and enter the access code given to you at your visit today. This web site gives a general overview about your procedure. However, you should still follow specific instructions given to you by our office regarding your preparation for the procedure.  We have sent the following medications to your pharmacy for you to pick up at your convenience: Jet provider has requested that you go to the basement level for lab work before leaving today. Press "B" on the elevator. The lab is located at the first door on the left as you exit the elevator. CBC  You will be contacted by our office prior to your procedure for directions on holding your Xarelto.  If you do not hear from our office 1 week prior to your scheduled procedure, please call [phone] to discuss.   HOLD IRON FOR FIVE DAYS PRIOR TO YOUR PROCEDURE.  Thank you for choosing me and [facility].   Tye Savoy, NP  To help prevent the possible spread of infection to our patients, communities, and staff; we will be implementing the following measures:  Please only allow one visitor/family member to accompany you to any upcoming  appointments with [facility]. If you have any concerns about this please contact our office to discuss prior to the appointment.

## 2018-09-15 ENCOUNTER — Encounter: Payer: Self-pay | Admitting: Nurse Practitioner

## 2018-09-15 NOTE — Progress Notes (Signed)
Chief Complaint:      IMPRESSION and PLAN:    63. 73 yo male recently hospitalized with severe IDA in setting of anticoagulation.[name] underwent small bowel enteroscopy with treatment of non-bleeding AVMS in stomach and small bowel. More non-bleeding AVMs found and treated in colon but semi-liquid stool throughout colon precluded adequate visualization. What appeared to be a small sigmoid polyp was removed but path c/w with food material.  -Patient has a [facility] of colon cancer, last surveillance colonoscopy was 5 years ago. Given this as well as the inadequate prep on recent colonoscopy will schedule him for repeat colonoscopy off Xarelto. The risks and benefits of colonoscopy with possible polypectomy / biopsies were discussed and the patient agrees to proceed.  -needs 2 day bowel prep.  -holding iron prior to procedure.   2. Hx of unprovoked DVT December 2019, on Xarelto   - Hold Xarelto for 2 days before procedure - will instruct when and how to resume after procedure. Patient understands that there is a low but real risk of cardiovascular event such as heart attack, stroke, or embolism /  thrombosis while off blood thinner. The patient consents to proceed. Will communicate by phone or EMR with patient's prescribing provider to confirm that holding Xarelto is reasonable in this case.  3. Mild exertional dyspnea.  -Will repeat hgb today. There could be a component of volume overload.  [name] has a history of grade I diastolic dysfunction. Lungs sound okay. Not short of breath now. [name] has bilateral lower extremity edema but also has history of venous insufficiency  4. Severe LVH. EF 60-65% on echo 09/06/18.     HPI:     Patient is a 73 yo male with a history of  DVT, chronic venous insufficiency, grade I diastolic heart failure. [name] is known to [name] for history of achalasia (botox injection Oct 2019) , intestinal AVMs, and [name] of colon cancer. Patient was admitted late February with dark  stools and symptomatic iron deficiency on anticoagulant. Presenting hgb was 7.4, ferritin 8. Hgb in 2015 was 13 ( no labs in between). [name] was transfused 2 uPRBC and underwent inpatient endoscopic workup. Colonoscopy and small bowel enteroscopy by ShawnMansouraty on 08/23/18 with findings as below.    08/23/18 EGD - nonbleeding AVMs of stomach, duodenum and jejunum which were treated with APC and clips. Distal esophagus noted to be dilated / LES hypertonic.   Colonoscopy-  Redundant tortuous colon. Exam was completed but the prep was inadequate as there was semi-liquid stool throughout the colon.. There were 5 small non-bleeding angioectasias found throughout the colon. Lesions treated with APC and a clip placed to larger lesion. A tiny sigmoid polyp was removed. There was self-limited bleeding at site, clip placed empirically. Plan for to repeat colonoscopy as outpatient  Patient was discharged home on iron. Hgb on 09/02/18 was 8.5. His stools since discharge have been normal color. [name] has no abdominal pain. His only complaint is that of feeling winded with even short distances. No cough. No chest pain.    Review of systems:     No chest pain, no SOB, no fevers, no urinary sx   Past Medical History:  Diagnosis Date  . 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  Excellent symptomatic response to LES Botox injections.   . Anemia 08/20/2018  . AVM (arteriovenous malformation) of colon 08/26/2013   Cecum X 3, without  bleeding on colonoscopy February 2015   . B12 deficiency 10/01/2011   Discovered to 2 progressive neurologic pain and dysfunction.  Diagnosis established March 2013.  Requires the following treatment: B12 IM monthly until level normalizes. After that, oral supplementation.   . Cataract   . Chronic venous insufficiency 11/28/2013  . Constipation due to opioid therapy 03/11/2015  . Deep venous thrombosis ([name]  05/31/2018   Right lower extremity, unprovoked  . Diverticulosis 08/26/2013  . Essential hypertension 06/15/2006  . Family history of malignant neoplasm of gastrointestinal tract 06/30/2013   Father with colon cancer age 8   . Gastroesophageal reflux disease 02/04/2007  . GI bleeding 08/21/2018  . Internal hemorrhoids 08/26/2013  . Left posterior subcapsular cataract 10/03/2013   s/p yag laser capsulotomy 10/03/2013   . 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  Complicated by chronic low back pain and neuropathy.  Nerve conduction study (10/19/11): Diffusely low motor amplitudes, with primarily involvement of the peroneal and posterior tibial nerves. No evidence of generalized peripheral neuropathy. Findings could be c/w bil multilevel lumbosacral radiculopathies or a primary motor neuropathy.   . Mild aortic valve stenosis 10/09/2006   Echo (12/09/2009): Valve area: 2.03 cm2   . Neuropathy   . Obesity, Class I, BMI 30-34.9 04/05/2012  . Onychomycosis of toenail 06/05/2014  . Osteoarthritis 11/25/2009   Right knee (medial compartment), right hip, left shoulder   . 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 Trigger little finger of right hand 03/11/2015    Patient's surgical history, family medical history, social history, medications and allergies were all reviewed in Epic   Serum creatinine: 1.31 mg/dL (H) 09/02/18 1027 Estimated creatinine clearance: 67.3 mL/min (A)  Current Outpatient Medications  Medication Sig Dispense Refill  . COMBIGAN 0.2-0.5 % ophthalmic solution Place 1 drop into both eyes 2 (two) times daily.    . cyclopentolate (CYCLODRYL,CYCLOGYL) 1 % ophthalmic solution Place 1 drop into both eyes daily.  0  . Ferrous Sulfate (IRON) 325 (65 Fe) MG TABS Take 1 tablet (325 mg total) by  mouth daily. 30 each 0  . furosemide (LASIX) 40 MG tablet Take 1 tablet (40 mg total) by mouth daily. 30 tablet 2  . gabapentin (NEURONTIN) 300 MG capsule Take 2 capsules (600 mg total) by mouth 3 (three) times daily. 540 capsule 3  . hydrocortisone cream 1 % Apply to itchy area 2 times daily (Patient taking differently: Apply 1 application topically 2 (two) times daily. ) 45 g 1  . LUMIGAN 0.01 % SOLN     . oxyCODONE-acetaminophen (PERCOCET) 10-325 MG tablet Take 1 tablet by mouth every 8 (eight) hours as needed for pain. 75 tablet 0  . pantoprazole (PROTONIX) 40 MG tablet Take 1 tablet (40 mg total) by mouth 2 (two) times daily. 60 tablet 0  . Rivaroxaban (XARELTO) 15 MG TABS tablet Take 1 tablet (15 mg total) by mouth 2 (two) times daily with a meal for 21 days. 42 tablet 0  . sorbitol 70 % solution Take 15 mLs by mouth daily as needed. (Patient taking differently: Take 15 mLs by mouth daily as needed (for constipation). ) 3840 mL 3  . verapamil (CALAN-SR) 180 MG CR tablet Take 1 tablet (180 mg total) by mouth daily. 90 tablet 3  . vitamin B-12 (CYANOCOBALAMIN) 1000 MCG  tablet Take 1 tablet (1,000 mcg total) by mouth daily. 90 tablet 3  . Na Sulfate-K Sulfate-Mg Sulf 17.5-3.13-1.6 GM/177ML SOLN Suprep-Use as directed 354 mL 0   No current facility-administered medications for this visit.     Physical Exam:     BP 120/60 (BP Location: Left Arm, Patient Position: Sitting, Cuff Size: Normal)   Pulse (!) 115   Temp 98.1 F (36.7 C)   Ht 5' 11.75" (1.822 m)   Wt 283 lb 2 oz (128.4 kg)   SpO2 95%   BMI 38.67 kg/m   GENERAL:  Pleasant male in NAD PSYCH: : Cooperative, normal affect EENT:  conjunctiva pink, mucous membranes moist, neck supple without masses CARDIAC:  RRR, +murmur heard, 2-3+ BLE edema.  PULM: Normal respiratory effort, lungs CTA bilaterally, no wheezing ABDOMEN:  Nondistended, soft, nontender. No obvious masses, no hepatomegaly,  normal bowel sounds SKIN:  turgor, no  lesions seen Musculoskeletal:  Normal muscle tone, normal strength NEURO: Alert and oriented x 3, no focal neurologic deficits   Tye Savoy , NP 09/15/2018, 8:18 PM

## 2018-09-16 ENCOUNTER — Other Ambulatory Visit: Payer: Self-pay | Admitting: Internal Medicine

## 2018-09-16 DIAGNOSIS — I82401 Acute embolism and thrombosis of unspecified deep veins of right lower extremity: Secondary | ICD-10-CM

## 2018-09-16 NOTE — Telephone Encounter (Signed)
Spoke with patient today regarding holding Xarelto two days prior to his procedure.  Okayed by Dr. Eppie Gibson.  Patient verbalized understanding to HOLD starting 10/08/18.

## 2018-09-16 NOTE — Progress Notes (Signed)
____________________________________________________________  Attending physician addendum:  Thank you for sending this case to me. I have reviewed the entire note, and records /reports related to his recent hospital stay  His colonoscopy must be moved to the hospital outpatient endoscopy department because it is likely to require endoscopic therapy to ablate any remaining AVMs that may be found. Although there are restrictions on outpatient procedures at the hospital endo lab at this time, this procedure is not routine, and efforts should be made to schedule it in 4-6 weeks.  Please coordinate with the endoscopy dept re: scheduling.  He also needs IV iron if Hgb is not improving at next check in 2-3 weeks on oral iron.  Wilfrid Lund, MD  ____________________________________________________________

## 2018-09-16 NOTE — Progress Notes (Signed)
[name] was started on anticoagulation 05/31/2018 for a newly diagnosed deep venous thrombosis likely proviked in the setting of stasis from chronic nevous insufficiency.  He did have a brief interruption in his anticoagulation.  As it has been > 3 months, it should be safe to briefly hold the anticoagulation when the elective procedure is scheduled.  Please restart the anticoagulation when safe to do so after the procedure.  Thank You.

## 2018-09-17 ENCOUNTER — Encounter: Payer: Medicare Other | Admitting: Gastroenterology

## 2018-09-17 ENCOUNTER — Encounter (HOSPITAL_COMMUNITY): Payer: Medicare Other

## 2018-09-17 MED ORDER — RIVAROXABAN 20 MG PO TABS: 20.0000 mg | ORAL_TABLET | Freq: Every day | ORAL | 3 refills | Status: DC

## 2018-09-17 NOTE — Addendum Note (Signed)
Addended by: [name] D on: 09/17/2018 09:05 AM   Modules accepted: Orders

## 2018-09-18 DIAGNOSIS — Z86718 Personal history of other venous thrombosis and embolism: Secondary | ICD-10-CM | POA: Diagnosis not present

## 2018-09-18 DIAGNOSIS — G8929 Other chronic pain: Secondary | ICD-10-CM | POA: Diagnosis not present

## 2018-09-18 DIAGNOSIS — I119 Hypertensive heart disease without heart failure: Secondary | ICD-10-CM | POA: Diagnosis not present

## 2018-09-18 DIAGNOSIS — K641 Second degree hemorrhoids: Secondary | ICD-10-CM | POA: Diagnosis not present

## 2018-09-18 DIAGNOSIS — M48062 Spinal stenosis, lumbar region with neurogenic claudication: Secondary | ICD-10-CM | POA: Diagnosis not present

## 2018-09-18 DIAGNOSIS — K5730 Diverticulosis of large intestine without perforation or abscess without bleeding: Secondary | ICD-10-CM | POA: Diagnosis not present

## 2018-09-18 DIAGNOSIS — Z550 Illiteracy and low-level literacy: Secondary | ICD-10-CM | POA: Diagnosis not present

## 2018-09-18 DIAGNOSIS — K219 Gastro-esophageal reflux disease without esophagitis: Secondary | ICD-10-CM | POA: Diagnosis not present

## 2018-09-18 DIAGNOSIS — M150 Primary generalized (osteo)arthritis: Secondary | ICD-10-CM | POA: Diagnosis not present

## 2018-09-18 DIAGNOSIS — D509 Iron deficiency anemia, unspecified: Secondary | ICD-10-CM | POA: Diagnosis not present

## 2018-09-18 DIAGNOSIS — K5521 Angiodysplasia of colon with hemorrhage: Secondary | ICD-10-CM | POA: Diagnosis not present

## 2018-09-18 DIAGNOSIS — I872 Venous insufficiency (chronic) (peripheral): Secondary | ICD-10-CM | POA: Diagnosis not present

## 2018-09-18 DIAGNOSIS — K5900 Constipation, unspecified: Secondary | ICD-10-CM | POA: Diagnosis not present

## 2018-09-18 DIAGNOSIS — Z7901 Long term (current) use of anticoagulants: Secondary | ICD-10-CM | POA: Diagnosis not present

## 2018-09-18 DIAGNOSIS — E538 Deficiency of other specified B group vitamins: Secondary | ICD-10-CM | POA: Diagnosis not present

## 2018-09-18 DIAGNOSIS — R269 Unspecified abnormalities of gait and mobility: Secondary | ICD-10-CM | POA: Diagnosis not present

## 2018-09-18 DIAGNOSIS — K220 Achalasia of cardia: Secondary | ICD-10-CM | POA: Diagnosis not present

## 2018-09-18 DIAGNOSIS — Z87891 Personal history of nicotine dependence: Secondary | ICD-10-CM | POA: Diagnosis not present

## 2018-09-18 DIAGNOSIS — G629 Polyneuropathy, unspecified: Secondary | ICD-10-CM | POA: Diagnosis not present

## 2018-09-18 DIAGNOSIS — I350 Nonrheumatic aortic (valve) stenosis: Secondary | ICD-10-CM | POA: Diagnosis not present

## 2018-09-22 ENCOUNTER — Encounter: Payer: Self-pay | Admitting: *Deleted

## 2018-09-24 ENCOUNTER — Other Ambulatory Visit: Payer: Self-pay | Admitting: Internal Medicine

## 2018-09-25 ENCOUNTER — Encounter: Payer: Self-pay | Admitting: Internal Medicine

## 2018-09-25 ENCOUNTER — Telehealth: Payer: Self-pay | Admitting: *Deleted

## 2018-09-25 ENCOUNTER — Ambulatory Visit (INDEPENDENT_AMBULATORY_CARE_PROVIDER_SITE_OTHER): Payer: Medicare Other | Admitting: Internal Medicine

## 2018-09-25 ENCOUNTER — Other Ambulatory Visit: Payer: Self-pay

## 2018-09-25 DIAGNOSIS — I5030 Unspecified diastolic (congestive) heart failure: Secondary | ICD-10-CM

## 2018-09-25 DIAGNOSIS — D500 Iron deficiency anemia secondary to blood loss (chronic): Secondary | ICD-10-CM

## 2018-09-25 NOTE — Assessment & Plan Note (Signed)
   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  AVM's found in SB and colon most were argon coagulated however bowel prep was inadequate and plans for a repeat colonoscopy this month.   Had appointment for 4/16 that was cancelled and he was told that they would call and reschedule.  He reports not feeling dizzy or lightheaded.  Has noted continued dark stools, is taking iron pills. Hasn't been called yet for colonoscopy. Still taking xarelto.  He reports his exercise capacity is at his baseline.  P:   We will recheck a CBC and iron studies, assess need for another infusion of IV iron  We are currently working on helping him get a follow-up appointment with GI we are told that they will call him when an appointment becomes available for repeated colonoscopy and repeat follow-up visit

## 2018-09-25 NOTE — Assessment & Plan Note (Addendum)
   A: HFpEF:  Aortic Valve stenosis likely underestimated on ECHO,  Can walk a good distance without getting winded, exercise capacity is at his baseline.  No longer dizzy, not feeling short of breath.  Has not been weighing himself.  Does have a scale weighed himself while I was on the phone.  His discharge weight from the hospital was around 279lbs, last office visit he was 301lbs.  December 2019 office visit weight 278lb.  Today his weight is 267.6lbs.  Swelling in legs has improved but still not completely resolved.  Laying flat when he is sleeping.  No PND.  He is not sure what his true dry weight is.  He says his urine output has picked up some lately.  BP today is 119/60 per his nurse who helped during the visit.  Still taking lasix 40mg  BID.  Does not see a cardiologist.  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  He has low voltage on his EKG.    P:   Instructed pt to take 40mg  daily instead of twice daily.  Slowdown his diuresis a bit.  We will check a BNP, CMP and CBC/iron studies per above.  Working with home health to obtain labs

## 2018-09-25 NOTE — Progress Notes (Signed)
.   [facility] Telephone Encounter  Reason for call:   This telephone encounter was created for [name] on 09/25/2018 for the following purpose/cc Iron Deficiency Anemia 2/2 GI blood loss,  HFpEF.   Pertinent Data:   ECHO 3/13: LV with normal systolic function, severe concentric left ventricular hypertrophy.  Restrictive filling noted diastolic dysfunction.  Heavily calcified aortic valve AI mild to moderate, AS mild to moderate.  Last hgb 8.7 on 09/13/18 stable was 8.5 on hospital d/c on 08/24/18   Assessment / Plan / Recommendations:   A: Iron Deficiency Anemia 2/2 GI blood loss: Pt had one dose of feraheme on 2/27, has been on oral iron since 08/25/18.  AVM's found in SB and colon most were argon coagulated however bowel prep was inadequate and plans for a repeat colonoscopy this month.   Had appointment for 4/16 that was cancelled and he was told that they would call and reschedule.  He reports not feeling dizzy or lightheaded.  Has noted continued dark stools, is taking iron pills. Hasn't been called yet for colonoscopy. Still taking xarelto.  He reports his exercise capacity is at his baseline.  P:   We will recheck a CBC and iron studies, assess need for another infusion of IV iron  We are currently working on helping him get a follow-up appointment with GI we are told that they will call him when an appointment becomes available for repeated colonoscopy and repeat follow-up visit  A: HFpEF:  Aortic Valve stenosis likely underestimated on ECHO,  Can walk a good distance without getting winded, exercise capacity is at his baseline.  No longer dizzy, not feeling short of breath.  Has not been weighing himself.  Does have a scale weighed himself while I was on the phone.  His discharge weight from the hospital was around 279lbs, last office visit he was 301lbs.  December 2019 office visit weight 278lb.  Today his weight is 267.6lbs.  Swelling in legs  has improved but still not completely resolved.  Laying flat when he is sleeping.  No PND.  He is not sure what his true dry weight is.  He says his urine output has picked up some lately.  BP today is 119/60 per his nurse who helped during the visit.  Still taking lasix 40mg  BID.  Does not see a cardiologist.  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  He has low voltage on his EKG.    P:   Instructed pt to take 40mg  daily instead of twice daily.  Slowdown his diuresis a bit.  We will check a BNP, CMP and CBC/iron studies per above.  Working with home health so they can perform blood draw.    Will need workup for amyloid and to be followed for valvular disease-recommend establishing with cardiology.    Consent and Medical Decision Making:   Patient discussed with Dr. Lynnae January  This is a telephone encounter between [name] and [name] on 09/25/2018 for HFpEF and Iron Deficiency Anemia. The visit was conducted with the patient located at home and [name] at [facility]. The patient's identity was confirmed using their DOB and current address. The patient has consented to being evaluated through a telephone encounter and understands the associated risks (an examination cannot be done and the patient may need to come in for an appointment) / benefits (allows the patient to remain at home, decreasing exposure to coronavirus). I  personally spent 28 minutes on medical discussion.

## 2018-09-25 NOTE — Telephone Encounter (Addendum)
Spoke with patient and Caregiver Mickel Baas from [facility]. Asked if patient has Home Health visiting.  Mickel Baas said [facility] does come out from time to time.  Call to [facility] now [facility] to see if they provide services.  Spoke to representative who said that they do .  When told that Dr. Shan Levans would like to order a lab draw on the patient.  Representative said that she would have someone contact us for an order when a Nurse is available .  Sander Nephew, RN 09/25/2018 11:21 AM  Call to  [facility] to give  orders for labwork.  Was asked to fax to [phone] as Nurses were unavailable to take order.  Information was faxed.  Sander Nephew, RN 09/25/2018 3:30 PM   Call to [facility] to see if fax was received.  Fax not found was asked to fax to [phone]. Fax was done.   Was able to speak with Glenard Haring who will be going out to see patient.  Glenard Haring  was given the order for the labs orally per order of Dr. Shan Levans.  Glenard Haring also is going to need  a signed order to continue visiting patient until 10/24/2018.  Message to be sent to Dr. Heber [place].  Angel plans to have labs drawn within the next couple days.  Sander Nephew, RN 09/26/2018 11:06 AM

## 2018-09-25 NOTE — Telephone Encounter (Signed)
Caregiver is calling back

## 2018-09-26 DIAGNOSIS — I350 Nonrheumatic aortic (valve) stenosis: Secondary | ICD-10-CM | POA: Diagnosis not present

## 2018-09-26 DIAGNOSIS — M48062 Spinal stenosis, lumbar region with neurogenic claudication: Secondary | ICD-10-CM | POA: Diagnosis not present

## 2018-09-26 DIAGNOSIS — K641 Second degree hemorrhoids: Secondary | ICD-10-CM | POA: Diagnosis not present

## 2018-09-26 DIAGNOSIS — R269 Unspecified abnormalities of gait and mobility: Secondary | ICD-10-CM | POA: Diagnosis not present

## 2018-09-26 DIAGNOSIS — I872 Venous insufficiency (chronic) (peripheral): Secondary | ICD-10-CM | POA: Diagnosis not present

## 2018-09-26 DIAGNOSIS — Z550 Illiteracy and low-level literacy: Secondary | ICD-10-CM | POA: Diagnosis not present

## 2018-09-26 DIAGNOSIS — E538 Deficiency of other specified B group vitamins: Secondary | ICD-10-CM | POA: Diagnosis not present

## 2018-09-26 DIAGNOSIS — D509 Iron deficiency anemia, unspecified: Secondary | ICD-10-CM | POA: Diagnosis not present

## 2018-09-26 DIAGNOSIS — K5900 Constipation, unspecified: Secondary | ICD-10-CM | POA: Diagnosis not present

## 2018-09-26 DIAGNOSIS — G8929 Other chronic pain: Secondary | ICD-10-CM | POA: Diagnosis not present

## 2018-09-26 DIAGNOSIS — K220 Achalasia of cardia: Secondary | ICD-10-CM | POA: Diagnosis not present

## 2018-09-26 DIAGNOSIS — I119 Hypertensive heart disease without heart failure: Secondary | ICD-10-CM | POA: Diagnosis not present

## 2018-09-26 DIAGNOSIS — M150 Primary generalized (osteo)arthritis: Secondary | ICD-10-CM | POA: Diagnosis not present

## 2018-09-26 DIAGNOSIS — K5730 Diverticulosis of large intestine without perforation or abscess without bleeding: Secondary | ICD-10-CM | POA: Diagnosis not present

## 2018-09-26 DIAGNOSIS — Z7901 Long term (current) use of anticoagulants: Secondary | ICD-10-CM | POA: Diagnosis not present

## 2018-09-26 DIAGNOSIS — G629 Polyneuropathy, unspecified: Secondary | ICD-10-CM | POA: Diagnosis not present

## 2018-09-26 DIAGNOSIS — Z87891 Personal history of nicotine dependence: Secondary | ICD-10-CM | POA: Diagnosis not present

## 2018-09-26 DIAGNOSIS — K219 Gastro-esophageal reflux disease without esophagitis: Secondary | ICD-10-CM | POA: Diagnosis not present

## 2018-09-26 DIAGNOSIS — K5521 Angiodysplasia of colon with hemorrhage: Secondary | ICD-10-CM | POA: Diagnosis not present

## 2018-09-26 DIAGNOSIS — Z86718 Personal history of other venous thrombosis and embolism: Secondary | ICD-10-CM | POA: Diagnosis not present

## 2018-09-27 ENCOUNTER — Encounter: Payer: Medicare Other | Admitting: Internal Medicine

## 2018-09-27 NOTE — Progress Notes (Signed)
Internal Medicine Clinic Attending  Case discussed with Dr. Winfrey  at the time of the visit.  We reviewed the resident's history and exam and pertinent patient test results.  I agree with the assessment, diagnosis, and plan of care documented in the resident's note.  

## 2018-09-29 DIAGNOSIS — R269 Unspecified abnormalities of gait and mobility: Secondary | ICD-10-CM | POA: Diagnosis not present

## 2018-09-29 DIAGNOSIS — K641 Second degree hemorrhoids: Secondary | ICD-10-CM | POA: Diagnosis not present

## 2018-09-29 DIAGNOSIS — Z87891 Personal history of nicotine dependence: Secondary | ICD-10-CM | POA: Diagnosis not present

## 2018-09-29 DIAGNOSIS — G629 Polyneuropathy, unspecified: Secondary | ICD-10-CM | POA: Diagnosis not present

## 2018-09-29 DIAGNOSIS — I119 Hypertensive heart disease without heart failure: Secondary | ICD-10-CM | POA: Diagnosis not present

## 2018-09-29 DIAGNOSIS — G8929 Other chronic pain: Secondary | ICD-10-CM | POA: Diagnosis not present

## 2018-09-29 DIAGNOSIS — I350 Nonrheumatic aortic (valve) stenosis: Secondary | ICD-10-CM | POA: Diagnosis not present

## 2018-09-29 DIAGNOSIS — D509 Iron deficiency anemia, unspecified: Secondary | ICD-10-CM | POA: Diagnosis not present

## 2018-09-29 DIAGNOSIS — M48062 Spinal stenosis, lumbar region with neurogenic claudication: Secondary | ICD-10-CM | POA: Diagnosis not present

## 2018-09-29 DIAGNOSIS — K5900 Constipation, unspecified: Secondary | ICD-10-CM | POA: Diagnosis not present

## 2018-09-29 DIAGNOSIS — Z86718 Personal history of other venous thrombosis and embolism: Secondary | ICD-10-CM | POA: Diagnosis not present

## 2018-09-29 DIAGNOSIS — Z7901 Long term (current) use of anticoagulants: Secondary | ICD-10-CM | POA: Diagnosis not present

## 2018-09-29 DIAGNOSIS — Z550 Illiteracy and low-level literacy: Secondary | ICD-10-CM | POA: Diagnosis not present

## 2018-09-29 DIAGNOSIS — K220 Achalasia of cardia: Secondary | ICD-10-CM | POA: Diagnosis not present

## 2018-09-29 DIAGNOSIS — K5730 Diverticulosis of large intestine without perforation or abscess without bleeding: Secondary | ICD-10-CM | POA: Diagnosis not present

## 2018-09-29 DIAGNOSIS — K219 Gastro-esophageal reflux disease without esophagitis: Secondary | ICD-10-CM | POA: Diagnosis not present

## 2018-09-29 DIAGNOSIS — E538 Deficiency of other specified B group vitamins: Secondary | ICD-10-CM | POA: Diagnosis not present

## 2018-09-29 DIAGNOSIS — M150 Primary generalized (osteo)arthritis: Secondary | ICD-10-CM | POA: Diagnosis not present

## 2018-09-29 DIAGNOSIS — K5521 Angiodysplasia of colon with hemorrhage: Secondary | ICD-10-CM | POA: Diagnosis not present

## 2018-09-29 DIAGNOSIS — I872 Venous insufficiency (chronic) (peripheral): Secondary | ICD-10-CM | POA: Diagnosis not present

## 2018-09-30 ENCOUNTER — Telehealth: Payer: Self-pay

## 2018-09-30 NOTE — Telephone Encounter (Signed)
Morayati, Lonell Face, RMA  Morayati, Lonell Face, RMA  Cc: Elias Else, [place]        Per Nevin Bloodgood, per Dr. Loletha Carrow patients procedure has been canceled at [facility] and needs to be done at [facility] in 3-4 weeks. Please let me know when he has an opening at [facility]. thanks    Dr. Loletha Carrow, per Barbera Setters, the hospital isn't scheduling any cases until June 2020. Is this an urgent case vs routine. Please advise.

## 2018-10-01 DIAGNOSIS — R269 Unspecified abnormalities of gait and mobility: Secondary | ICD-10-CM | POA: Diagnosis not present

## 2018-10-01 DIAGNOSIS — D509 Iron deficiency anemia, unspecified: Secondary | ICD-10-CM | POA: Diagnosis not present

## 2018-10-01 DIAGNOSIS — M150 Primary generalized (osteo)arthritis: Secondary | ICD-10-CM | POA: Diagnosis not present

## 2018-10-01 DIAGNOSIS — G629 Polyneuropathy, unspecified: Secondary | ICD-10-CM | POA: Diagnosis not present

## 2018-10-01 DIAGNOSIS — E538 Deficiency of other specified B group vitamins: Secondary | ICD-10-CM | POA: Diagnosis not present

## 2018-10-01 DIAGNOSIS — K220 Achalasia of cardia: Secondary | ICD-10-CM | POA: Diagnosis not present

## 2018-10-01 DIAGNOSIS — G8929 Other chronic pain: Secondary | ICD-10-CM | POA: Diagnosis not present

## 2018-10-01 DIAGNOSIS — Z550 Illiteracy and low-level literacy: Secondary | ICD-10-CM | POA: Diagnosis not present

## 2018-10-01 DIAGNOSIS — I872 Venous insufficiency (chronic) (peripheral): Secondary | ICD-10-CM | POA: Diagnosis not present

## 2018-10-01 DIAGNOSIS — K219 Gastro-esophageal reflux disease without esophagitis: Secondary | ICD-10-CM | POA: Diagnosis not present

## 2018-10-01 DIAGNOSIS — K5730 Diverticulosis of large intestine without perforation or abscess without bleeding: Secondary | ICD-10-CM | POA: Diagnosis not present

## 2018-10-01 DIAGNOSIS — Z87891 Personal history of nicotine dependence: Secondary | ICD-10-CM | POA: Diagnosis not present

## 2018-10-01 DIAGNOSIS — I350 Nonrheumatic aortic (valve) stenosis: Secondary | ICD-10-CM | POA: Diagnosis not present

## 2018-10-01 DIAGNOSIS — K641 Second degree hemorrhoids: Secondary | ICD-10-CM | POA: Diagnosis not present

## 2018-10-01 DIAGNOSIS — M48062 Spinal stenosis, lumbar region with neurogenic claudication: Secondary | ICD-10-CM | POA: Diagnosis not present

## 2018-10-01 DIAGNOSIS — I119 Hypertensive heart disease without heart failure: Secondary | ICD-10-CM | POA: Diagnosis not present

## 2018-10-01 DIAGNOSIS — Z7901 Long term (current) use of anticoagulants: Secondary | ICD-10-CM | POA: Diagnosis not present

## 2018-10-01 DIAGNOSIS — K5521 Angiodysplasia of colon with hemorrhage: Secondary | ICD-10-CM | POA: Diagnosis not present

## 2018-10-01 DIAGNOSIS — Z86718 Personal history of other venous thrombosis and embolism: Secondary | ICD-10-CM | POA: Diagnosis not present

## 2018-10-01 DIAGNOSIS — K5900 Constipation, unspecified: Secondary | ICD-10-CM | POA: Diagnosis not present

## 2018-10-02 ENCOUNTER — Other Ambulatory Visit: Payer: Self-pay

## 2018-10-02 ENCOUNTER — Telehealth: Payer: Self-pay | Admitting: *Deleted

## 2018-10-02 ENCOUNTER — Ambulatory Visit: Payer: Medicare Other

## 2018-10-02 NOTE — Telephone Encounter (Signed)
[name],  Please see my addendum to [name] clinic note of 09/13/18  Severe IDA, recent inpatient colonoscopy inadequate prep.  Needs repeat colonoscopy with 2 day prep, and 2 days off Xarelto before.  Must be done in hospital endo lab.  I do not feel it can wait until June.  [name] should have had CBC and ferritin by now.  Please see that he gets it done next week.  [name],   Please review and give me your opinion on trying to get this case done at [facility] in late April/early May. I will be back in the office 4/13 and we can discuss further.  - HD

## 2018-10-02 NOTE — Telephone Encounter (Signed)
Call from pt's home health aid-she wanted to be present when MD makes telehealth call.  MD not available to make call at this time.  Home health aid also states pt wanted lab results from labs obtained 3-4 days ago.  Unable to locate results or speak with home health nurse.  Will have [facility] lab assist in obtaining results.Shawn Hidden Cassady4/8/20203:55 PM    1345:call to [place] hold 55min -call was ended by CMA 1400: 2nd call made to [facility]-transferred to home health dept and given nurse cell # [phone]: Call to [place] ([facility]) [phone] "mailbox full" unable to leave message. 1550: Call to [place] ([facility]) [phone] "mailbox full" unable to leave message.

## 2018-10-03 NOTE — Telephone Encounter (Addendum)
10/03/18 1019: call made to [facility] (formerly [facility]). [facility] did not process order for cbc and now reneed "permission" from [facility] to release the order and process specimen.  CMA contact info was taken-someone will call me back. Regenia Skeeter, Darlene Cassady4/9/202011:59 AM   10/03/18 1200: Call made to Olga,RN-"unable to speak-in the middle of lecture", Asked that I call back in 30 minutes.Despina Hidden Cassady4/9/202012:01 PM    10/03/18 1341: spoke with Olga,RN-she will assist with getting CBC request to [facility].[place], Darlene Cassady4/9/20201:42 PM

## 2018-10-08 ENCOUNTER — Ambulatory Visit (INDEPENDENT_AMBULATORY_CARE_PROVIDER_SITE_OTHER): Payer: Medicare Other | Admitting: Internal Medicine

## 2018-10-08 ENCOUNTER — Other Ambulatory Visit: Payer: Self-pay

## 2018-10-08 ENCOUNTER — Telehealth: Payer: Self-pay | Admitting: *Deleted

## 2018-10-08 DIAGNOSIS — R0902 Hypoxemia: Secondary | ICD-10-CM | POA: Diagnosis not present

## 2018-10-08 DIAGNOSIS — I119 Hypertensive heart disease without heart failure: Secondary | ICD-10-CM | POA: Diagnosis not present

## 2018-10-08 DIAGNOSIS — Z86718 Personal history of other venous thrombosis and embolism: Secondary | ICD-10-CM | POA: Diagnosis not present

## 2018-10-08 DIAGNOSIS — E538 Deficiency of other specified B group vitamins: Secondary | ICD-10-CM | POA: Diagnosis not present

## 2018-10-08 DIAGNOSIS — Z7901 Long term (current) use of anticoagulants: Secondary | ICD-10-CM | POA: Diagnosis not present

## 2018-10-08 DIAGNOSIS — G629 Polyneuropathy, unspecified: Secondary | ICD-10-CM | POA: Diagnosis not present

## 2018-10-08 DIAGNOSIS — G8929 Other chronic pain: Secondary | ICD-10-CM | POA: Diagnosis not present

## 2018-10-08 DIAGNOSIS — Z87891 Personal history of nicotine dependence: Secondary | ICD-10-CM | POA: Diagnosis not present

## 2018-10-08 DIAGNOSIS — K5730 Diverticulosis of large intestine without perforation or abscess without bleeding: Secondary | ICD-10-CM | POA: Diagnosis not present

## 2018-10-08 DIAGNOSIS — I350 Nonrheumatic aortic (valve) stenosis: Secondary | ICD-10-CM | POA: Diagnosis not present

## 2018-10-08 DIAGNOSIS — K5900 Constipation, unspecified: Secondary | ICD-10-CM | POA: Diagnosis not present

## 2018-10-08 DIAGNOSIS — R269 Unspecified abnormalities of gait and mobility: Secondary | ICD-10-CM | POA: Diagnosis not present

## 2018-10-08 DIAGNOSIS — K5521 Angiodysplasia of colon with hemorrhage: Secondary | ICD-10-CM | POA: Diagnosis not present

## 2018-10-08 DIAGNOSIS — K219 Gastro-esophageal reflux disease without esophagitis: Secondary | ICD-10-CM | POA: Diagnosis not present

## 2018-10-08 DIAGNOSIS — D509 Iron deficiency anemia, unspecified: Secondary | ICD-10-CM | POA: Diagnosis not present

## 2018-10-08 DIAGNOSIS — I872 Venous insufficiency (chronic) (peripheral): Secondary | ICD-10-CM | POA: Diagnosis not present

## 2018-10-08 DIAGNOSIS — K220 Achalasia of cardia: Secondary | ICD-10-CM | POA: Diagnosis not present

## 2018-10-08 DIAGNOSIS — M48062 Spinal stenosis, lumbar region with neurogenic claudication: Secondary | ICD-10-CM | POA: Diagnosis not present

## 2018-10-08 DIAGNOSIS — Z550 Illiteracy and low-level literacy: Secondary | ICD-10-CM | POA: Diagnosis not present

## 2018-10-08 DIAGNOSIS — K641 Second degree hemorrhoids: Secondary | ICD-10-CM | POA: Diagnosis not present

## 2018-10-08 DIAGNOSIS — M150 Primary generalized (osteo)arthritis: Secondary | ICD-10-CM | POA: Diagnosis not present

## 2018-10-08 NOTE — Telephone Encounter (Signed)
[name] NIECE PLS CALL REGARDING THE PT, PT IS NERVOUS AND NOT UNDERSTANDING PLS CALL HER AT [phone]

## 2018-10-08 NOTE — Assessment & Plan Note (Signed)
HPI: Home RN called to report low pulse ox of 86% on room air. Other vital signs; BP 140/70, HR 76. The patient's weight is down to 266.4 pounds. He does have significant lower extremity edema but his extremities are warm to the touch. On the nurses physical exam his lungs are clear and the patient is meditating appropriately. The patient denies fevers, chills, orthopnea, exertional shortness of breath, changes in urine output. The patient's only complaint is abdominal pain secondary to constipation because it is not had a bowel movement approximately one week.  A/P: Overall the patient appears stable without any symptoms. His hands are cold to the touch which may be why he is having a low pulse ox. Given that he is asymptomatic we will hold off on bringing the patient in for evaluation. The home health RN will continue regular checkups.

## 2018-10-08 NOTE — Progress Notes (Signed)
   CC: Low pulse ox  This is a telephone encounter between [name] and Ina Homes on 10/08/2018 for low pulse ox. The visit was conducted with the patient located at home and [facility] at [facility]. The patient's identity was confirmed using their DOB and current address. The home health RN, Kennieth Rad has consented to being evaluated through a telephone encounter and understands the associated risks (an examination cannot be done and the patient may need to come in for an appointment) / benefits (allows the patient to remain at home, decreasing exposure to coronavirus). I personally spent 10 minutes on medical discussion.   HPI:  [name] is a 73 y.o. with PMH as below.   Please see A&P for assessment of the patient's acute and chronic medical conditions.   Past Medical History:  Diagnosis Date  . 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  Excellent symptomatic response to LES Botox injections.   . Anemia 08/20/2018  . AVM (arteriovenous malformation) of colon 08/26/2013   Cecum X 3, without bleeding on colonoscopy February 2015   . B12 deficiency 10/01/2011   Discovered to 2 progressive neurologic pain and dysfunction.  Diagnosis established March 2013.  Requires the following treatment: B12 IM monthly until level normalizes. After that, oral supplementation.   . Cataract   . Chronic venous insufficiency 11/28/2013  . Constipation due to opioid therapy 03/11/2015  . Deep venous thrombosis ([facility]) 05/31/2018   Right lower extremity, unprovoked  . Diverticulosis 08/26/2013  . Essential hypertension 06/15/2006  . Family history of malignant neoplasm of gastrointestinal tract 06/30/2013   Father with colon cancer age 20   . Gastroesophageal reflux disease 02/04/2007  . GI bleeding 08/21/2018  . Internal hemorrhoids 08/26/2013  . Left posterior subcapsular cataract 10/03/2013   s/p yag laser  capsulotomy 10/03/2013   . 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  Complicated by chronic low back pain and neuropathy.  Nerve conduction study (10/19/11): Diffusely low motor amplitudes, with primarily involvement of the peroneal and posterior tibial nerves. No evidence of generalized peripheral neuropathy. Findings could be c/w bil multilevel lumbosacral radiculopathies or a primary motor neuropathy.   . Mild aortic valve stenosis 10/09/2006   Echo (12/09/2009): Valve area: 2.03 cm2   . Neuropathy   . Obesity, Class I, BMI 30-34.9 04/05/2012  . Onychomycosis of toenail 06/05/2014  . Osteoarthritis 11/25/2009   Right knee (medial compartment), right hip, left shoulder   . 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 Trigger little finger of right hand 03/11/2015   Review of Systems:  Performed and all others negative.  Assessment & Plan:   See Encounters Tab for problem based charting.  Patient discussed with Dr. Beryle Beams

## 2018-10-08 NOTE — Telephone Encounter (Signed)
Received call from [facility] with [facility]-states pt O2 sats 85-87% BP 140/70 and does not appear to be in any type of distress. States pt's only complaint is being constipated.  Hgb 7.9 (labs were collected on 09/26/18, but not received by [facility] until 10/07/18).  Pt scheduled for telehealth visit tomorrow,  But CMA will have [place] MD contact pt today to access if needs a physical visit or not.  Pt contacted by Dr Daryel November telephone encounter.Regenia Skeeter, Darlene Cassady4/14/202011:28 AM

## 2018-10-08 NOTE — Telephone Encounter (Signed)
Dr. Carlean Purl feels that, based on current restrictions/policies, this patient's procedure must wait until June.  Please make sure labs get done as recently requested (earlier in this message stream).

## 2018-10-08 NOTE — Progress Notes (Signed)
Medicine attending: Medical history, presenting problems,  and medications, reviewed with resident physician Dr Ina Homes  on the day of the patient telephone consultation and I concur with  evaluation and management plan. Dr Lemmie Evens spoke w pt visiting RN. Severe LVH w diastolic dysfunction, mild-mod aortic stenosis/regurg, LVEF 60-65% on 09/06/18 echo. Chronic anemia & CKD 3 but lab all stable on 09/13/18. Pt asymptomatic at rest. Lungs clear per RN. No edema. O2 sat 85%. Given age & stable clinical status, I do not feel that he needs a contact visit unless clinical situation changes. Home RN will continue to monitor. Weight on 3/20 283 down from 301 on 3/13.

## 2018-10-08 NOTE — Telephone Encounter (Signed)
[name],  As discussed due to Covid-19 restrictions on endoscopy procedures I believe this man should wait to do his colonoscopy unless we know he is having significant bleeding ongoing - and would then reconsider.  Gatha Mayer, MD, [place] Director Endoscopy at [facility]

## 2018-10-09 ENCOUNTER — Ambulatory Visit: Payer: Medicare Other

## 2018-10-09 NOTE — Telephone Encounter (Signed)
Left a message to return call.  

## 2018-10-10 ENCOUNTER — Encounter: Payer: Medicare Other | Admitting: Gastroenterology

## 2018-10-10 ENCOUNTER — Telehealth: Payer: Self-pay | Admitting: *Deleted

## 2018-10-10 DIAGNOSIS — Z7901 Long term (current) use of anticoagulants: Secondary | ICD-10-CM | POA: Diagnosis not present

## 2018-10-10 DIAGNOSIS — M48062 Spinal stenosis, lumbar region with neurogenic claudication: Secondary | ICD-10-CM | POA: Diagnosis not present

## 2018-10-10 DIAGNOSIS — K5521 Angiodysplasia of colon with hemorrhage: Secondary | ICD-10-CM | POA: Diagnosis not present

## 2018-10-10 DIAGNOSIS — E538 Deficiency of other specified B group vitamins: Secondary | ICD-10-CM | POA: Diagnosis not present

## 2018-10-10 DIAGNOSIS — G8929 Other chronic pain: Secondary | ICD-10-CM | POA: Diagnosis not present

## 2018-10-10 DIAGNOSIS — K219 Gastro-esophageal reflux disease without esophagitis: Secondary | ICD-10-CM | POA: Diagnosis not present

## 2018-10-10 DIAGNOSIS — Z86718 Personal history of other venous thrombosis and embolism: Secondary | ICD-10-CM | POA: Diagnosis not present

## 2018-10-10 DIAGNOSIS — Z87891 Personal history of nicotine dependence: Secondary | ICD-10-CM | POA: Diagnosis not present

## 2018-10-10 DIAGNOSIS — K641 Second degree hemorrhoids: Secondary | ICD-10-CM | POA: Diagnosis not present

## 2018-10-10 DIAGNOSIS — M150 Primary generalized (osteo)arthritis: Secondary | ICD-10-CM | POA: Diagnosis not present

## 2018-10-10 DIAGNOSIS — I119 Hypertensive heart disease without heart failure: Secondary | ICD-10-CM | POA: Diagnosis not present

## 2018-10-10 DIAGNOSIS — K5900 Constipation, unspecified: Secondary | ICD-10-CM | POA: Diagnosis not present

## 2018-10-10 DIAGNOSIS — K5730 Diverticulosis of large intestine without perforation or abscess without bleeding: Secondary | ICD-10-CM | POA: Diagnosis not present

## 2018-10-10 DIAGNOSIS — G629 Polyneuropathy, unspecified: Secondary | ICD-10-CM | POA: Diagnosis not present

## 2018-10-10 DIAGNOSIS — D509 Iron deficiency anemia, unspecified: Secondary | ICD-10-CM | POA: Diagnosis not present

## 2018-10-10 DIAGNOSIS — I872 Venous insufficiency (chronic) (peripheral): Secondary | ICD-10-CM | POA: Diagnosis not present

## 2018-10-10 DIAGNOSIS — Z550 Illiteracy and low-level literacy: Secondary | ICD-10-CM | POA: Diagnosis not present

## 2018-10-10 DIAGNOSIS — R269 Unspecified abnormalities of gait and mobility: Secondary | ICD-10-CM | POA: Diagnosis not present

## 2018-10-10 DIAGNOSIS — I350 Nonrheumatic aortic (valve) stenosis: Secondary | ICD-10-CM | POA: Diagnosis not present

## 2018-10-10 DIAGNOSIS — K220 Achalasia of cardia: Secondary | ICD-10-CM | POA: Diagnosis not present

## 2018-10-10 NOTE — Telephone Encounter (Signed)
HHN calls and states pt is doing well today, walking, talking, drinking juice, had a BM. But 02 sats remain at only 90 and just around 90, she states she checked them appr 10 times. Pt denies and does not appear to be having symptoms- she thinks he is doing better than recently in fact at todays visit.   [place]

## 2018-10-14 ENCOUNTER — Telehealth: Payer: Self-pay | Admitting: *Deleted

## 2018-10-14 DIAGNOSIS — G629 Polyneuropathy, unspecified: Secondary | ICD-10-CM | POA: Diagnosis not present

## 2018-10-14 DIAGNOSIS — Z87891 Personal history of nicotine dependence: Secondary | ICD-10-CM | POA: Diagnosis not present

## 2018-10-14 DIAGNOSIS — I872 Venous insufficiency (chronic) (peripheral): Secondary | ICD-10-CM | POA: Diagnosis not present

## 2018-10-14 DIAGNOSIS — M150 Primary generalized (osteo)arthritis: Secondary | ICD-10-CM | POA: Diagnosis not present

## 2018-10-14 DIAGNOSIS — K5521 Angiodysplasia of colon with hemorrhage: Secondary | ICD-10-CM | POA: Diagnosis not present

## 2018-10-14 DIAGNOSIS — K5900 Constipation, unspecified: Secondary | ICD-10-CM | POA: Diagnosis not present

## 2018-10-14 DIAGNOSIS — K5730 Diverticulosis of large intestine without perforation or abscess without bleeding: Secondary | ICD-10-CM | POA: Diagnosis not present

## 2018-10-14 DIAGNOSIS — K219 Gastro-esophageal reflux disease without esophagitis: Secondary | ICD-10-CM | POA: Diagnosis not present

## 2018-10-14 DIAGNOSIS — Z86718 Personal history of other venous thrombosis and embolism: Secondary | ICD-10-CM | POA: Diagnosis not present

## 2018-10-14 DIAGNOSIS — M48062 Spinal stenosis, lumbar region with neurogenic claudication: Secondary | ICD-10-CM | POA: Diagnosis not present

## 2018-10-14 DIAGNOSIS — K641 Second degree hemorrhoids: Secondary | ICD-10-CM | POA: Diagnosis not present

## 2018-10-14 DIAGNOSIS — Z550 Illiteracy and low-level literacy: Secondary | ICD-10-CM | POA: Diagnosis not present

## 2018-10-14 DIAGNOSIS — R269 Unspecified abnormalities of gait and mobility: Secondary | ICD-10-CM | POA: Diagnosis not present

## 2018-10-14 DIAGNOSIS — G8929 Other chronic pain: Secondary | ICD-10-CM | POA: Diagnosis not present

## 2018-10-14 DIAGNOSIS — I350 Nonrheumatic aortic (valve) stenosis: Secondary | ICD-10-CM | POA: Diagnosis not present

## 2018-10-14 DIAGNOSIS — E538 Deficiency of other specified B group vitamins: Secondary | ICD-10-CM | POA: Diagnosis not present

## 2018-10-14 DIAGNOSIS — Z7901 Long term (current) use of anticoagulants: Secondary | ICD-10-CM | POA: Diagnosis not present

## 2018-10-14 DIAGNOSIS — K220 Achalasia of cardia: Secondary | ICD-10-CM | POA: Diagnosis not present

## 2018-10-14 DIAGNOSIS — D509 Iron deficiency anemia, unspecified: Secondary | ICD-10-CM | POA: Diagnosis not present

## 2018-10-14 DIAGNOSIS — I119 Hypertensive heart disease without heart failure: Secondary | ICD-10-CM | POA: Diagnosis not present

## 2018-10-14 NOTE — Telephone Encounter (Signed)
Received call from patient's [facility] nurse, Kennieth Rad, RN. States she was notified by patient's Ferguson today that patient's BP was 100/50 and he felt dizzy, "drunk" and had increased SHOB with walking from elevator to his room. When Kiouna arrived, patient's BP 130/80 SpO2 95% 4+ pitting edema lower extremities bilat. Also notes slight wheeze in LLL with inspiration and diminished breath sounds in both upper lobes. Patient has difficulty taking deep breaths so this may add to diminished sounds. Patient states he feels better. No longer dizzy or "drunk" feeling. States he's eating but has no real appetite. Kiouna doesn't think he is acting as well as he did at last visit; not laughing or talking much. Patient is adamant he does not want to be seen at clinic or hospital but she needed to report her findings. Patient had telehealth visit on 10/08/2018. Will route to that provider as well as PCP. [name], RN, BSN

## 2018-10-14 NOTE — Telephone Encounter (Signed)
Thank you lauren, if he changes his mind I would welcome a same day [facility] visit.

## 2018-10-14 NOTE — Telephone Encounter (Signed)
Noted thx drG

## 2018-10-17 NOTE — Telephone Encounter (Signed)
Left message to return call 

## 2018-10-22 ENCOUNTER — Other Ambulatory Visit: Payer: Self-pay | Admitting: *Deleted

## 2018-10-22 DIAGNOSIS — M1711 Unilateral primary osteoarthritis, right knee: Secondary | ICD-10-CM

## 2018-10-22 MED ORDER — OXYCODONE-ACETAMINOPHEN 10-325 MG PO TABS: 1.0000 | ORAL_TABLET | Freq: Three times a day (TID) | ORAL | 0 refills | Status: DC | PRN

## 2018-10-22 NOTE — Telephone Encounter (Signed)
Last office visit: TeleHealth visit on 10/08/18 Last UDS: 08/28/2017 Last Refilled:  09/17/18 #75 Next appt: none scheduled at this time

## 2018-10-22 NOTE — Telephone Encounter (Signed)
Refilled 1 month supply, reviewed database appropriate.

## 2018-10-23 ENCOUNTER — Other Ambulatory Visit: Payer: Self-pay | Admitting: *Deleted

## 2018-10-23 DIAGNOSIS — I872 Venous insufficiency (chronic) (peripheral): Secondary | ICD-10-CM | POA: Diagnosis not present

## 2018-10-23 DIAGNOSIS — K5521 Angiodysplasia of colon with hemorrhage: Secondary | ICD-10-CM | POA: Diagnosis not present

## 2018-10-23 DIAGNOSIS — Z7901 Long term (current) use of anticoagulants: Secondary | ICD-10-CM | POA: Diagnosis not present

## 2018-10-23 DIAGNOSIS — G8929 Other chronic pain: Secondary | ICD-10-CM | POA: Diagnosis not present

## 2018-10-23 DIAGNOSIS — E538 Deficiency of other specified B group vitamins: Secondary | ICD-10-CM | POA: Diagnosis not present

## 2018-10-23 DIAGNOSIS — K219 Gastro-esophageal reflux disease without esophagitis: Secondary | ICD-10-CM | POA: Diagnosis not present

## 2018-10-23 DIAGNOSIS — Z550 Illiteracy and low-level literacy: Secondary | ICD-10-CM | POA: Diagnosis not present

## 2018-10-23 DIAGNOSIS — K5730 Diverticulosis of large intestine without perforation or abscess without bleeding: Secondary | ICD-10-CM | POA: Diagnosis not present

## 2018-10-23 DIAGNOSIS — Z86718 Personal history of other venous thrombosis and embolism: Secondary | ICD-10-CM | POA: Diagnosis not present

## 2018-10-23 DIAGNOSIS — M48062 Spinal stenosis, lumbar region with neurogenic claudication: Secondary | ICD-10-CM | POA: Diagnosis not present

## 2018-10-23 DIAGNOSIS — K641 Second degree hemorrhoids: Secondary | ICD-10-CM | POA: Diagnosis not present

## 2018-10-23 DIAGNOSIS — R269 Unspecified abnormalities of gait and mobility: Secondary | ICD-10-CM | POA: Diagnosis not present

## 2018-10-23 DIAGNOSIS — K5900 Constipation, unspecified: Secondary | ICD-10-CM | POA: Diagnosis not present

## 2018-10-23 DIAGNOSIS — G629 Polyneuropathy, unspecified: Secondary | ICD-10-CM | POA: Diagnosis not present

## 2018-10-23 DIAGNOSIS — M150 Primary generalized (osteo)arthritis: Secondary | ICD-10-CM | POA: Diagnosis not present

## 2018-10-23 DIAGNOSIS — K220 Achalasia of cardia: Secondary | ICD-10-CM | POA: Diagnosis not present

## 2018-10-23 DIAGNOSIS — I119 Hypertensive heart disease without heart failure: Secondary | ICD-10-CM | POA: Diagnosis not present

## 2018-10-23 DIAGNOSIS — Z87891 Personal history of nicotine dependence: Secondary | ICD-10-CM | POA: Diagnosis not present

## 2018-10-23 DIAGNOSIS — I350 Nonrheumatic aortic (valve) stenosis: Secondary | ICD-10-CM | POA: Diagnosis not present

## 2018-10-23 DIAGNOSIS — D509 Iron deficiency anemia, unspecified: Secondary | ICD-10-CM | POA: Diagnosis not present

## 2018-10-25 MED ORDER — PANTOPRAZOLE SODIUM 40 MG PO TBEC: 40.0000 mg | DELAYED_RELEASE_TABLET | Freq: Two times a day (BID) | ORAL | 0 refills | Status: DC

## 2018-10-28 NOTE — Progress Notes (Unsigned)
Left a message on [phone]. Care coordinator.

## 2018-10-29 NOTE — Telephone Encounter (Addendum)
Spoke to Mrs Mirna Mires the care coordinator. She had Mr [name] with her. He will have his labs drawn this week.

## 2018-10-31 ENCOUNTER — Other Ambulatory Visit: Payer: Self-pay

## 2018-10-31 DIAGNOSIS — K31819 Angiodysplasia of stomach and duodenum without bleeding: Secondary | ICD-10-CM

## 2018-10-31 DIAGNOSIS — D509 Iron deficiency anemia, unspecified: Secondary | ICD-10-CM

## 2018-10-31 NOTE — Progress Notes (Signed)
Pt aware to have labs completed

## 2018-11-01 ENCOUNTER — Other Ambulatory Visit: Payer: Self-pay

## 2018-11-01 ENCOUNTER — Encounter (HOSPITAL_COMMUNITY): Payer: Self-pay | Admitting: Student

## 2018-11-01 ENCOUNTER — Other Ambulatory Visit (INDEPENDENT_AMBULATORY_CARE_PROVIDER_SITE_OTHER): Payer: Medicare Other

## 2018-11-01 ENCOUNTER — Inpatient Hospital Stay (HOSPITAL_COMMUNITY)
Admission: EM | Admit: 2018-11-01 | Discharge: 2018-11-07 | DRG: 378 | Disposition: A | Discharge status: Home Health | Payer: Medicare Other | Attending: Internal Medicine | Admitting: Internal Medicine

## 2018-11-01 DIAGNOSIS — Z888 Allergy status to other drugs, medicaments and biological substances status: Secondary | ICD-10-CM | POA: Diagnosis not present

## 2018-11-01 DIAGNOSIS — Z1159 Encounter for screening for other viral diseases: Secondary | ICD-10-CM

## 2018-11-01 DIAGNOSIS — Z6833 Body mass index (BMI) 33.0-33.9, adult: Secondary | ICD-10-CM | POA: Diagnosis not present

## 2018-11-01 DIAGNOSIS — I110 Hypertensive heart disease with heart failure: Secondary | ICD-10-CM

## 2018-11-01 DIAGNOSIS — Z79899 Other long term (current) drug therapy: Secondary | ICD-10-CM | POA: Diagnosis not present

## 2018-11-01 DIAGNOSIS — N183 Chronic kidney disease, stage 3 (moderate): Secondary | ICD-10-CM | POA: Diagnosis present

## 2018-11-01 DIAGNOSIS — T454X5A Adverse effect of iron and its compounds, initial encounter: Secondary | ICD-10-CM | POA: Diagnosis not present

## 2018-11-01 DIAGNOSIS — I5032 Chronic diastolic (congestive) heart failure: Secondary | ICD-10-CM | POA: Diagnosis present

## 2018-11-01 DIAGNOSIS — K5730 Diverticulosis of large intestine without perforation or abscess without bleeding: Secondary | ICD-10-CM | POA: Diagnosis not present

## 2018-11-01 DIAGNOSIS — Y92230 Patient room in hospital as the place of occurrence of the external cause: Secondary | ICD-10-CM | POA: Diagnosis not present

## 2018-11-01 DIAGNOSIS — Z833 Family history of diabetes mellitus: Secondary | ICD-10-CM

## 2018-11-01 DIAGNOSIS — Z841 Family history of disorders of kidney and ureter: Secondary | ICD-10-CM

## 2018-11-01 DIAGNOSIS — M1990 Unspecified osteoarthritis, unspecified site: Secondary | ICD-10-CM | POA: Diagnosis present

## 2018-11-01 DIAGNOSIS — D649 Anemia, unspecified: Secondary | ICD-10-CM

## 2018-11-01 DIAGNOSIS — I959 Hypotension, unspecified: Secondary | ICD-10-CM | POA: Diagnosis not present

## 2018-11-01 DIAGNOSIS — K220 Achalasia of cardia: Secondary | ICD-10-CM | POA: Diagnosis not present

## 2018-11-01 DIAGNOSIS — E669 Obesity, unspecified: Secondary | ICD-10-CM | POA: Diagnosis present

## 2018-11-01 DIAGNOSIS — E538 Deficiency of other specified B group vitamins: Secondary | ICD-10-CM | POA: Diagnosis not present

## 2018-11-01 DIAGNOSIS — Z86718 Personal history of other venous thrombosis and embolism: Secondary | ICD-10-CM

## 2018-11-01 DIAGNOSIS — I352 Nonrheumatic aortic (valve) stenosis with insufficiency: Secondary | ICD-10-CM | POA: Diagnosis not present

## 2018-11-01 DIAGNOSIS — T8089XA Other complications following infusion, transfusion and therapeutic injection, initial encounter: Secondary | ICD-10-CM | POA: Diagnosis not present

## 2018-11-01 DIAGNOSIS — E876 Hypokalemia: Secondary | ICD-10-CM | POA: Diagnosis not present

## 2018-11-01 DIAGNOSIS — I351 Nonrheumatic aortic (valve) insufficiency: Secondary | ICD-10-CM | POA: Diagnosis not present

## 2018-11-01 DIAGNOSIS — R011 Cardiac murmur, unspecified: Secondary | ICD-10-CM | POA: Diagnosis not present

## 2018-11-01 DIAGNOSIS — I130 Hypertensive heart and chronic kidney disease with heart failure and stage 1 through stage 4 chronic kidney disease, or unspecified chronic kidney disease: Secondary | ICD-10-CM | POA: Diagnosis present

## 2018-11-01 DIAGNOSIS — K5520 Angiodysplasia of colon without hemorrhage: Secondary | ICD-10-CM | POA: Diagnosis not present

## 2018-11-01 DIAGNOSIS — Z862 Personal history of diseases of the blood and blood-forming organs and certain disorders involving the immune mechanism: Secondary | ICD-10-CM | POA: Diagnosis present

## 2018-11-01 DIAGNOSIS — D500 Iron deficiency anemia secondary to blood loss (chronic): Secondary | ICD-10-CM | POA: Diagnosis present

## 2018-11-01 DIAGNOSIS — R4020 Unspecified coma: Secondary | ICD-10-CM | POA: Diagnosis not present

## 2018-11-01 DIAGNOSIS — Z800 Family history of malignant neoplasm of digestive organs: Secondary | ICD-10-CM | POA: Diagnosis not present

## 2018-11-01 DIAGNOSIS — I248 Other forms of acute ischemic heart disease: Secondary | ICD-10-CM | POA: Diagnosis not present

## 2018-11-01 DIAGNOSIS — Z7982 Long term (current) use of aspirin: Secondary | ICD-10-CM | POA: Diagnosis not present

## 2018-11-01 DIAGNOSIS — Y848 Other medical procedures as the cause of abnormal reaction of the patient, or of later complication, without mention of misadventure at the time of the procedure: Secondary | ICD-10-CM | POA: Diagnosis not present

## 2018-11-01 DIAGNOSIS — Z9889 Other specified postprocedural states: Secondary | ICD-10-CM | POA: Diagnosis not present

## 2018-11-01 DIAGNOSIS — N179 Acute kidney failure, unspecified: Secondary | ICD-10-CM | POA: Diagnosis present

## 2018-11-01 DIAGNOSIS — Z8249 Family history of ischemic heart disease and other diseases of the circulatory system: Secondary | ICD-10-CM

## 2018-11-01 DIAGNOSIS — K5790 Diverticulosis of intestine, part unspecified, without perforation or abscess without bleeding: Secondary | ICD-10-CM | POA: Diagnosis not present

## 2018-11-01 DIAGNOSIS — Z79891 Long term (current) use of opiate analgesic: Secondary | ICD-10-CM | POA: Diagnosis not present

## 2018-11-01 DIAGNOSIS — K31819 Angiodysplasia of stomach and duodenum without bleeding: Secondary | ICD-10-CM

## 2018-11-01 DIAGNOSIS — K644 Residual hemorrhoidal skin tags: Secondary | ICD-10-CM | POA: Diagnosis not present

## 2018-11-01 DIAGNOSIS — R0609 Other forms of dyspnea: Secondary | ICD-10-CM | POA: Diagnosis not present

## 2018-11-01 DIAGNOSIS — H268 Other specified cataract: Secondary | ICD-10-CM | POA: Diagnosis present

## 2018-11-01 DIAGNOSIS — K449 Diaphragmatic hernia without obstruction or gangrene: Secondary | ICD-10-CM | POA: Diagnosis present

## 2018-11-01 DIAGNOSIS — Z01810 Encounter for preprocedural cardiovascular examination: Secondary | ICD-10-CM | POA: Diagnosis not present

## 2018-11-01 DIAGNOSIS — F17210 Nicotine dependence, cigarettes, uncomplicated: Secondary | ICD-10-CM | POA: Diagnosis present

## 2018-11-01 DIAGNOSIS — K5521 Angiodysplasia of colon with hemorrhage: Secondary | ICD-10-CM | POA: Diagnosis not present

## 2018-11-01 DIAGNOSIS — Z03818 Encounter for observation for suspected exposure to other biological agents ruled out: Secondary | ICD-10-CM | POA: Diagnosis not present

## 2018-11-01 DIAGNOSIS — Q2733 Arteriovenous malformation of digestive system vessel: Secondary | ICD-10-CM | POA: Diagnosis not present

## 2018-11-01 DIAGNOSIS — K219 Gastro-esophageal reflux disease without esophagitis: Secondary | ICD-10-CM | POA: Diagnosis not present

## 2018-11-01 DIAGNOSIS — K2951 Unspecified chronic gastritis with bleeding: Secondary | ICD-10-CM | POA: Diagnosis not present

## 2018-11-01 DIAGNOSIS — K3189 Other diseases of stomach and duodenum: Secondary | ICD-10-CM | POA: Diagnosis not present

## 2018-11-01 DIAGNOSIS — D509 Iron deficiency anemia, unspecified: Secondary | ICD-10-CM | POA: Diagnosis not present

## 2018-11-01 DIAGNOSIS — K319 Disease of stomach and duodenum, unspecified: Secondary | ICD-10-CM | POA: Diagnosis not present

## 2018-11-01 DIAGNOSIS — R7989 Other specified abnormal findings of blood chemistry: Secondary | ICD-10-CM | POA: Diagnosis not present

## 2018-11-01 DIAGNOSIS — K648 Other hemorrhoids: Secondary | ICD-10-CM | POA: Diagnosis not present

## 2018-11-01 DIAGNOSIS — T501X5A Adverse effect of loop [high-ceiling] diuretics, initial encounter: Secondary | ICD-10-CM | POA: Diagnosis present

## 2018-11-01 DIAGNOSIS — K641 Second degree hemorrhoids: Secondary | ICD-10-CM | POA: Diagnosis present

## 2018-11-01 DIAGNOSIS — Z8719 Personal history of other diseases of the digestive system: Secondary | ICD-10-CM | POA: Diagnosis not present

## 2018-11-01 DIAGNOSIS — D72829 Elevated white blood cell count, unspecified: Secondary | ICD-10-CM | POA: Diagnosis not present

## 2018-11-01 DIAGNOSIS — K922 Gastrointestinal hemorrhage, unspecified: Secondary | ICD-10-CM | POA: Diagnosis not present

## 2018-11-01 DIAGNOSIS — I350 Nonrheumatic aortic (valve) stenosis: Secondary | ICD-10-CM | POA: Diagnosis not present

## 2018-11-01 LAB — COMPREHENSIVE METABOLIC PANEL
ALT: 14 U/L (ref 0–44)
AST: 17 U/L (ref 15–41)
Albumin: 3.4 g/dL — ABNORMAL LOW (ref 3.5–5.0)
Alkaline Phosphatase: 40 U/L (ref 38–126)
Anion gap: 12 (ref 5–15)
BUN: 19 mg/dL (ref 8–23)
CO2: 26 mmol/L (ref 22–32)
Calcium: 9.7 mg/dL (ref 8.9–10.3)
Chloride: 102 mmol/L (ref 98–111)
Creatinine, Ser: 1.35 mg/dL — ABNORMAL HIGH (ref 0.61–1.24)
GFR calc Af Amer: 59 mL/min — ABNORMAL LOW (ref >60)
GFR calc non Af Amer: 51 mL/min — ABNORMAL LOW (ref >60)
Glucose, Bld: 80 mg/dL (ref 70–99)
Potassium: 3.1 mmol/L — ABNORMAL LOW (ref 3.5–5.1)
Sodium: 140 mmol/L (ref 135–145)
Total Bilirubin: 0.9 mg/dL (ref 0.3–1.2)
Total Protein: 6.6 g/dL (ref 6.5–8.1)

## 2018-11-01 LAB — CBC
HCT: 22.1 % — ABNORMAL LOW (ref 39.0–52.0)
HCT: 24.7 % — ABNORMAL LOW (ref 39.0–52.0)
Hemoglobin: 6.2 g/dL — CL (ref 13.0–17.0)
Hemoglobin: 7.2 g/dL — ABNORMAL LOW (ref 13.0–17.0)
MCH: 20.3 pg — ABNORMAL LOW (ref 26.0–34.0)
MCH: 21.1 pg — ABNORMAL LOW (ref 26.0–34.0)
MCHC: 28.1 g/dL — ABNORMAL LOW (ref 30.0–36.0)
MCHC: 29.1 g/dL — ABNORMAL LOW (ref 30.0–36.0)
MCV: 72.5 fL — ABNORMAL LOW (ref 80.0–100.0)
MCV: 72.2 fL — ABNORMAL LOW (ref 80.0–100.0)
Platelets: 402 10*3/uL — ABNORMAL HIGH (ref 150–400)
Platelets: 420 10*3/uL — ABNORMAL HIGH (ref 150–400)
RBC: 3.05 MIL/uL — ABNORMAL LOW (ref 4.22–5.81)
RBC: 3.42 MIL/uL — ABNORMAL LOW (ref 4.22–5.81)
RDW: 22.2 % — ABNORMAL HIGH (ref 11.5–15.5)
RDW: 23.4 % — ABNORMAL HIGH (ref 11.5–15.5)
WBC: 5.9 10*3/uL (ref 4.0–10.5)
WBC: 7.3 10*3/uL (ref 4.0–10.5)
nRBC: 0 % (ref 0.0–0.2)
nRBC: 0.3 % — ABNORMAL HIGH (ref 0.0–0.2)

## 2018-11-01 LAB — FERRITIN: Ferritin: 7.3 ng/mL — ABNORMAL LOW (ref 22.0–322.0)

## 2018-11-01 LAB — CBC WITH DIFFERENTIAL/PLATELET
Basophils Relative: 0 % (ref 0.0–3.0)
Eosinophils Relative: 2 % (ref 0.0–5.0)
HCT: 22.7 % — CL (ref 39.0–52.0)
Hemoglobin: 7 g/dL — CL (ref 13.0–17.0)
Lymphocytes Relative: 28 % (ref 12.0–46.0)
MCHC: 30.9 g/dL (ref 30.0–36.0)
MCV: 69 fl — ABNORMAL LOW (ref 78.0–100.0)
Monocytes Relative: 12 % (ref 3.0–12.0)
Neutrophils Relative %: 58 % (ref 43.0–77.0)
Platelets: 415 10*3/uL — ABNORMAL HIGH (ref 150.0–400.0)
RBC: 3.3 Mil/uL — ABNORMAL LOW (ref 4.22–5.81)
RDW: 22.9 % — ABNORMAL HIGH (ref 11.5–15.5)
WBC: 6.4 10*3/uL (ref 4.0–10.5)

## 2018-11-01 LAB — SARS CORONAVIRUS 2 BY RT PCR (HOSPITAL ORDER, PERFORMED IN ~~LOC~~ HOSPITAL LAB): SARS Coronavirus 2: NEGATIVE

## 2018-11-01 LAB — MAGNESIUM: Magnesium: 2 mg/dL (ref 1.7–2.4)

## 2018-11-01 LAB — PHOSPHORUS: Phosphorus: 3.2 mg/dL (ref 2.5–4.6)

## 2018-11-01 LAB — PREPARE RBC (CROSSMATCH)

## 2018-11-01 LAB — POC OCCULT BLOOD, ED: Fecal Occult Bld: POSITIVE — AB

## 2018-11-01 MED ORDER — PEG-KCL-NACL-NASULF-NA ASC-C 100 G PO SOLR
1.0000 | Freq: Once | ORAL | Status: DC
  Filled 2018-11-01: qty 1

## 2018-11-01 MED ORDER — BRIMONIDINE TARTRATE-TIMOLOL 0.2-0.5 % OP SOLN: 1.0000 [drp] | Freq: Two times a day (BID) | OPHTHALMIC | Status: DC

## 2018-11-01 MED ORDER — SORBITOL 70 % PO SOLN: 15.0000 mL | Freq: Every day | ORAL | Status: DC | PRN

## 2018-11-01 MED ORDER — ACETAMINOPHEN 325 MG PO TABS: 650.0000 mg | ORAL_TABLET | Freq: Four times a day (QID) | ORAL | Status: DC | PRN

## 2018-11-01 MED ORDER — PANTOPRAZOLE SODIUM 40 MG IV SOLR
40.0000 mg | Freq: Once | INTRAVENOUS | Status: AC
  Administered 2018-11-01: 40 mg via INTRAVENOUS
  Filled 2018-11-01: qty 40

## 2018-11-01 MED ORDER — OXYCODONE-ACETAMINOPHEN 5-325 MG PO TABS: 1.0000 | ORAL_TABLET | Freq: Three times a day (TID) | ORAL | Status: DC | PRN

## 2018-11-01 MED ORDER — POTASSIUM CHLORIDE CRYS ER 20 MEQ PO TBCR
40.0000 meq | EXTENDED_RELEASE_TABLET | Freq: Once | ORAL | Status: AC
  Administered 2018-11-01: 40 meq via ORAL
  Filled 2018-11-01: qty 2

## 2018-11-01 MED ORDER — GABAPENTIN 300 MG PO CAPS
600.0000 mg | ORAL_CAPSULE | Freq: Two times a day (BID) | ORAL | Status: DC
  Administered 2018-11-01 – 2018-11-03 (×4): 600 mg via ORAL
  Administered 2018-11-03: 300 mg via ORAL
  Administered 2018-11-04 – 2018-11-07 (×7): 600 mg via ORAL
  Filled 2018-11-01 (×12): qty 2

## 2018-11-01 MED ORDER — ACETAMINOPHEN 650 MG RE SUPP: 650.0000 mg | Freq: Four times a day (QID) | RECTAL | Status: DC | PRN

## 2018-11-01 MED ORDER — BRIMONIDINE TARTRATE 0.2 % OP SOLN
1.0000 [drp] | Freq: Two times a day (BID) | OPHTHALMIC | Status: DC
  Administered 2018-11-01 – 2018-11-07 (×12): 1 [drp] via OPHTHALMIC
  Filled 2018-11-01: qty 5

## 2018-11-01 MED ORDER — TIMOLOL MALEATE 0.5 % OP SOLN
1.0000 [drp] | Freq: Two times a day (BID) | OPHTHALMIC | Status: DC
  Administered 2018-11-01 – 2018-11-07 (×12): 1 [drp] via OPHTHALMIC
  Filled 2018-11-01: qty 5

## 2018-11-01 MED ORDER — POTASSIUM CHLORIDE 10 MEQ/100ML IV SOLN: 10.0000 meq | INTRAVENOUS | Status: DC

## 2018-11-01 MED ORDER — OXYCODONE-ACETAMINOPHEN 10-325 MG PO TABS: 1.0000 | ORAL_TABLET | Freq: Three times a day (TID) | ORAL | Status: DC | PRN

## 2018-11-01 MED ORDER — VITAMIN B-12 1000 MCG PO TABS
1000.0000 ug | ORAL_TABLET | Freq: Every day | ORAL | Status: DC
  Administered 2018-11-02 – 2018-11-07 (×6): 1000 ug via ORAL
  Filled 2018-11-01 (×6): qty 1

## 2018-11-01 MED ORDER — PANTOPRAZOLE SODIUM 40 MG PO TBEC
40.0000 mg | DELAYED_RELEASE_TABLET | Freq: Two times a day (BID) | ORAL | Status: DC
  Administered 2018-11-01 – 2018-11-07 (×12): 40 mg via ORAL
  Filled 2018-11-01 (×12): qty 1

## 2018-11-01 MED ORDER — SODIUM CHLORIDE 0.9 % IV SOLN
10.0000 mL/h | Freq: Once | INTRAVENOUS | Status: AC
  Administered 2018-11-01: 10 mL/h via INTRAVENOUS

## 2018-11-01 MED ORDER — BISACODYL 5 MG PO TBEC
5.0000 mg | DELAYED_RELEASE_TABLET | Freq: Two times a day (BID) | ORAL | Status: AC
  Administered 2018-11-01 – 2018-11-03 (×5): 5 mg via ORAL
  Filled 2018-11-01 (×5): qty 1

## 2018-11-01 MED ORDER — OXYCODONE HCL 5 MG PO TABS: 5.0000 mg | ORAL_TABLET | Freq: Three times a day (TID) | ORAL | Status: DC | PRN

## 2018-11-01 NOTE — Telephone Encounter (Signed)
I sent a result note to clinic nursing with instructions for contact this patient's care coordinator and have him go to the [facility] emergency department for admission.

## 2018-11-01 NOTE — H&P (Addendum)
Date: 11/01/2018               Patient Name:  [name] MRN: [id number]  DOB: 1946-03-23 Age / Sex: 73 y.o., male   PCP: Lucious Groves, DO         Medical Service: Internal Medicine Teaching Service         Attending Physician: Dr. Lucious Groves, DO    First Contact: Dr. Sharon Seller Pager: [phone]  Second Contact: Dr. Frederico Hamman Pager: [phone]       After Hours (After 5p/  First Contact Pager: [phone]  weekends / holidays): Second Contact Pager: [phone]   Chief Complaint: Acute anemia  History of Present Illness:  [name] is a 73 yo male with PMH achalasia, diverticulosis, HTN, HFpEF, B12 deficiency, prior DVT (05/2018) on xeralto, mild-moderative aortic stenosis, gastric, small bowel, and colonic AVMs previously admitted for GI bleed in February presenting today from [facility] clinic with acute anemia. Patient was found to have hemoglobin of 7 on CBC and was advised to go to the ED.  He states he has not noticed any blood in his stool. He endorses intermittent dark stools. He does endorse dizziness when he starts to walk but has no dizziness sitting or on standing from sitting. He states this is somewhat chronic. He denies falls. He denies diarrhea, nausea, vomiting, abdominal pain. He denies changes in urination, cough, or recent sick contacts. He denies recent weight loss, fever, or chills.    In the ED this afternoon repeat hemoglobin was 6.2. He was hemodynamically stable with blood pressure 138/71, pulse 82, no fever, sats 100% on room air. FOBT was positive. DRE showed no gross blood. 1U blood transfusion was started and he was given protonix 40 mg IV.   Social:  Smokes 1 pack of cigarettes per week Denies alcohol use   Family History:  Family History  Problem Relation Age of Onset  . Heart disease Mother   . Colon cancer Father 59  . Kidney disease Brother        on dialysis  . Pancreatic cancer Brother   . Hypertension Sister   . Diabetes Sister    . Kidney disease Brother        on dialysis  . Diabetes Sister   . Colon cancer Brother        2 brothers with colon cancer  . Cancer Daughter 37       Died at age of 52, unknown type  . Anesthesia problems Neg Hx      Meds:  Current Meds  Medication Sig  . COMBIGAN 0.2-0.5 % ophthalmic solution Place 1 drop into both eyes 2 (two) times daily.  . Ferrous Sulfate (IRON) 325 (65 Fe) MG TABS Take 1 tablet (325 mg total) by mouth daily.  . furosemide (LASIX) 40 MG tablet Take 1 tablet (40 mg total) by mouth daily.  [name] gabapentin (NEURONTIN) 300 MG capsule Take 2 capsules (600 mg total) by mouth 3 (three) times daily. (Patient taking differently: Take 600 mg by mouth 2 (two) times daily. )  . hydrocortisone cream 1 % Apply to itchy area 2 times daily (Patient taking differently: Apply 1 application topically 2 (two) times daily. )  . oxyCODONE-acetaminophen (PERCOCET) 10-325 MG tablet Take 1 tablet by mouth every 8 (eight) hours as needed for pain.  . pantoprazole (PROTONIX) 40 MG tablet Take 1 tablet (40 mg total) by mouth 2 (two) times daily.  . rivaroxaban (  XARELTO) 20 MG TABS tablet Take 1 tablet (20 mg total) by mouth daily with supper.  . sorbitol 70 % solution Take 15 mLs by mouth daily as needed. (Patient taking differently: Take 15 mLs by mouth daily as needed (for constipation). )  . vitamin B-12 (CYANOCOBALAMIN) 1000 MCG tablet Take 1 tablet (1,000 mcg total) by mouth daily.     Allergies: Allergies as of 11/01/2018 - Review Complete 11/01/2018  Allergen Reaction Noted  . Amlodipine Swelling and Other (See Comments) 02/25/2016   Past Medical History:  Diagnosis Date  . 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  Excellent symptomatic response to LES Botox injections.   . Anemia 08/20/2018  . AVM (arteriovenous malformation) of colon 08/26/2013   Cecum X 3, without bleeding on colonoscopy  February 2015   . B12 deficiency 10/01/2011   Discovered to 2 progressive neurologic pain and dysfunction.  Diagnosis established March 2013.  Requires the following treatment: B12 IM monthly until level normalizes. After that, oral supplementation.   . Cataract   . Chronic venous insufficiency 11/28/2013  . Constipation due to opioid therapy 03/11/2015  . Deep venous thrombosis ([place]) 05/31/2018   Right lower extremity, unprovoked  . Diverticulosis 08/26/2013  . Essential hypertension 06/15/2006  . Family history of malignant neoplasm of gastrointestinal tract 06/30/2013   Father with colon cancer age 14   . Gastroesophageal reflux disease 02/04/2007  . GI bleeding 08/21/2018  . Internal hemorrhoids 08/26/2013  . Left posterior subcapsular cataract 10/03/2013   s/p yag laser capsulotomy 10/03/2013   . 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  Complicated by chronic low back pain and neuropathy.  Nerve conduction study (10/19/11): Diffusely low motor amplitudes, with primarily involvement of the peroneal and posterior tibial nerves. No evidence of generalized peripheral neuropathy. Findings could be c/w bil multilevel lumbosacral radiculopathies or a primary motor neuropathy.   . Mild aortic valve stenosis 10/09/2006   Echo (12/09/2009): Valve area: 2.03 cm2   . Neuropathy   . Obesity, Class I, BMI 30-34.9 04/05/2012  . Onychomycosis of toenail 06/05/2014  . Osteoarthritis 11/25/2009   Right knee (medial compartment), right hip, left shoulder   . 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 Trigger little finger of right hand 03/11/2015     Review of Systems: A complete ROS was negative except as per HPI.   Physical Exam: Blood pressure 133/69, pulse 79, temperature 98.5 F (36.9 C), temperature source  Oral, resp. rate (!) 22, height 6\' 2"  (1.88 m), weight 117.9 kg, SpO2 100 %.   Constitution: NAD, supine in bed, obese HENT: [place]/AT Eyes: no scleral icterus  Cardio: RRR, III/VI RUSB systolic murmur, no rubs or gallops Respiratory: CTA, no w/r/r  Abdominal: non-distended, soft, +central obesity, NTTP, +BS MSK: +1 RLE edema, LLE no edema, moving all extremities  Neuro: a&o, normal affect  Skin: c/d/i    Assessment & Plan by Problem: Active Problems:   Lower GI bleed  [name] is a 73 yo male with PMH achalasia, colonic AVMs with recurrent lower GI bleeds, diverticulosis HTN, HTN, HFpEF, aortic stenosis,  History of GI bleed most recently admitted in February of this year. Colonoscopy showed 5 AVMs tx with APC, planned repeat 2/2 poor prep but this  has not yet been done. . Prior colonoscopy 2015 showed 3 AVMS without bleeding. Last EGD during this admission 07/2018 showed non-bleeding angiotectasias in the stomach, duodenum, and jejunum that were treated with argon plasma coagulation as well.   Acute GI Bleed Acute on Chronic Iron Deficiency and B12 Anemia  This am hemoglobin 7, repeat in the ED was 6.2. Currently receiving 1U RBC transfusion. He last took xarelto around 10pm last night. States he takes asa but this is not on his med list.   - protonix 40 mg bid - GI consulted, will f/u recs - likely plan for colonoscopy - NPO - post transfusion CBC - am CBC, BMP  - strict ins/outs - previously on asa for intermittent claudication symptoms and likely aortic stenosis - states he is taking this but do not see on med rec.  - cont. B12 supplement  - physical therapy ordered  History of Unprovoked DVTs RLE unprovoked DVT diagnosed this past December. He has been taking his xarelto daily.   - hold xarelto - SCDs  Hypokalemia Likely secondary to his lasix  - replete K as needed - add on Mg, Phos - am BMP   Chronic Diastolic Heart Failure Hypertension On lasix 40 mg qd  and verapamil 180 mg qd  - hold HF and blood pressure medication overnight, reassess in the am  Diet: BMP  VTE: SCDs IVF: none Code: full   Dispo: Admit patient to Inpatient with expected length of stay greater than 2 midnights.  SignedMarty Heck, DO 11/01/2018, 3:44 PM  Pager: [phone]

## 2018-11-01 NOTE — ED Provider Notes (Addendum)
[place] EMERGENCY DEPARTMENT Provider Note   CSN: [id number] Arrival date & time: 11/01/18  1240   History   Chief Complaint Chief Complaint  Patient presents with  . Abnormal Lab    HPI ION [name] is a 73 y.o. male with a hx of anemia, B12 deficiency, prior GI bleed, colon AVMs, mild aortic stenosis, HTN, obesity, & prior DVT on xarelto who presents to the ED due to abnormal outpatient blood work. He states he was told to come to the ER because his blood was low and that he needed to be admitted. In terms of sxs states he has continued dark stools as well as fatigue & lightheadedness/dizziness with sitting to standing transitions & ambulation. No other alleviating/aggravating factor. States this may be worse lightheadedness/dizziness over past 1 week, but this has been occurring for a couple months. Denies syncope, falls, nausea, vomiting, hematemesis, chest pain, abdominal pain, or bright red blood per rectum.   Chart review: Recent admission 08/21/18-08/25/18- GI bleed w/ symptomatic anemia, required transfusion, underwent enteroscopy & colonoscopy by [facility]-  multiple areas of nonbleeding angiectasias noted w/ APC tx, colonoscopy showed a poor prep and they were unable to completely visualize the whole colon- plan for repeat. Per outpatient notes plan for repeat within next 4-6 weeks as of mid April. Had outpatient labs today notable for anemia- sent to ER.     HPI  Past Medical History:  Diagnosis Date  . 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  Excellent symptomatic response to LES Botox injections.   . Anemia 08/20/2018  . AVM (arteriovenous malformation) of colon 08/26/2013   Cecum X 3, without bleeding on colonoscopy February 2015   . B12 deficiency 10/01/2011   Discovered to 2 progressive neurologic pain and dysfunction.  Diagnosis established March 2013.   Requires the following treatment: B12 IM monthly until level normalizes. After that, oral supplementation.   . Cataract   . Chronic venous insufficiency 11/28/2013  . Constipation due to opioid therapy 03/11/2015  . Deep venous thrombosis ([place]) 05/31/2018   Right lower extremity, unprovoked  . Diverticulosis 08/26/2013  . Essential hypertension 06/15/2006  . Family history of malignant neoplasm of gastrointestinal tract 06/30/2013   Father with colon cancer age 54   . Gastroesophageal reflux disease 02/04/2007  . GI bleeding 08/21/2018  . Internal hemorrhoids 08/26/2013  . Left posterior subcapsular cataract 10/03/2013   s/p yag laser capsulotomy 10/03/2013   . 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  Complicated by chronic low back pain and neuropathy.  Nerve conduction study (10/19/11): Diffusely low motor amplitudes, with primarily involvement of the peroneal and posterior tibial nerves. No evidence of generalized peripheral neuropathy. Findings could be c/w bil multilevel lumbosacral radiculopathies or a primary motor neuropathy.   . Mild aortic valve stenosis 10/09/2006   Echo (12/09/2009): Valve area: 2.03 cm2   . Neuropathy   . Obesity, Class I, BMI 30-34.9 04/05/2012  . Onychomycosis of toenail 06/05/2014  . Osteoarthritis 11/25/2009   Right knee (medial compartment), right hip, left shoulder   . 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 Trigger little finger of right hand 03/11/2015    Patient Active Problem List   Diagnosis Date Noted  . Hypoxia 10/08/2018  .  Hypervolemia 09/02/2018  . Symptomatic normocytic anemia 08/21/2018  . Acute GI bleeding 08/21/2018  . Dizziness 08/20/2018  . Scalp laceration 06/14/2018  . Deep venous thrombosis ([place]) 05/31/2018  . Trigger little finger of right  hand 03/11/2015  . Constipation due to opioid therapy 03/11/2015  . Onychomycosis of toenail 06/05/2014  . Chronic venous insufficiency 11/28/2013  . Diverticulosis 08/26/2013  . Internal hemorrhoids 08/26/2013  . AVM (arteriovenous malformation) of colon 08/26/2013  . Achalasia 05/05/2013  . Healthcare maintenance 04/27/2013  . Rhegmatogenous retinal detachment of right eye 03/18/2013  . Abnormality of gait 10/02/2012  . Obesity, Class II, BMI 35-39.9 04/05/2012  . 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 B12 deficiency 10/01/2011  . Asteatotic eczema 09/21/2011  . Illiteracy 09/14/2010  . Osteoarthritis of right knee 11/25/2009  . Gastroesophageal reflux disease without esophagitis 02/04/2007  . Moderate calcific aortic stenosis 10/09/2006  . Lumbar stenosis with neurogenic claudication 06/16/2006  . Essential hypertension 06/15/2006    Past Surgical History:  Procedure Laterality Date  . BOTOX INJECTION N/A 09/24/2013   Procedure: BOTOX INJECTION;  Surgeon: Inda Castle, MD;  Location: [facility];  Service: Endoscopy;  Laterality: N/A;  . BOTOX INJECTION N/A 07/17/2017   Procedure: BOTOX INJECTION;  Surgeon: Doran Stabler, MD;  Location: [facility];  Service: Gastroenterology;  Laterality: N/A;  . BOTOX INJECTION N/A 04/15/2018   Procedure: BOTOX INJECTION;  Surgeon: Doran Stabler, MD;  Location: [facility];  Service: Gastroenterology;  Laterality: N/A;  . CAPSULOTOMY Left 10/03/2013   Procedure: YAG CAPSULOTOMY OF LEFT EYE;  Surgeon: Myrtha Mantis., MD;  Location: [place];  Service: Ophthalmology;  Laterality: Left;  . CATARACT EXTRACTION  05/01/2012   left eye  . CATARACT EXTRACTION W/PHACO  11/08/2011   Procedure: CATARACT EXTRACTION PHACO AND INTRAOCULAR LENS PLACEMENT (IOC);  Surgeon: Adonis Brook, MD;  Location: [place];  Service: Ophthalmology;  Laterality: Right;  . CATARACT EXTRACTION W/PHACO  05/01/2012   Procedure:  CATARACT EXTRACTION PHACO AND INTRAOCULAR LENS PLACEMENT (IOC);  Surgeon: Adonis Brook, MD;  Location: [place];  Service: Ophthalmology;  Laterality: Left;  . COLONOSCOPY WITH PROPOFOL N/A 08/23/2018   Procedure: COLONOSCOPY WITH PROPOFOL;  Surgeon: Rush Landmark Telford Nab., MD;  Location: [place];  Service: Gastroenterology;  Laterality: N/A;  . colonscopy    . ENTEROSCOPY N/A 08/23/2018   Procedure: ENTEROSCOPY;  Surgeon: Mansouraty, Telford Nab., MD;  Location: [place];  Service: Gastroenterology;  Laterality: N/A;  . ESOPHAGEAL MANOMETRY N/A 08/25/2013   Procedure: ESOPHAGEAL MANOMETRY (EM);  Surgeon: Inda Castle, MD;  Location: [facility];  Service: Endoscopy;  Laterality: N/A;  . ESOPHAGOGASTRODUODENOSCOPY N/A 09/24/2013   Procedure: ESOPHAGOGASTRODUODENOSCOPY (EGD);  Surgeon: Inda Castle, MD;  Location: [facility];  Service: Endoscopy;  Laterality: N/A;  . ESOPHAGOGASTRODUODENOSCOPY N/A 02/17/2014   Procedure: ESOPHAGOGASTRODUODENOSCOPY (EGD);  Surgeon: Inda Castle, MD;  Location: [facility];  Service: Endoscopy;  Laterality: N/A;  . ESOPHAGOGASTRODUODENOSCOPY (EGD) WITH PROPOFOL N/A 07/17/2017   Procedure: ESOPHAGOGASTRODUODENOSCOPY (EGD) WITH PROPOFOL;  Surgeon: Doran Stabler, MD;  Location: [facility];  Service: Gastroenterology;  Laterality: N/A;  with BOTOX  . ESOPHAGOGASTRODUODENOSCOPY (EGD) WITH PROPOFOL N/A 04/15/2018   Procedure: ESOPHAGOGASTRODUODENOSCOPY (EGD) WITH PROPOFOL;  Surgeon: Doran Stabler, MD;  Location: [facility];  Service: Gastroenterology;  Laterality: N/A;  . EYE SURGERY     cat ext right  . FRACTURE SURGERY     left shoulder  . GAS/FLUID EXCHANGE Right  07/02/2013   Procedure: GAS/FLUID EXCHANGE;  Surgeon: Adonis Brook, MD;  Location: [place];  Service: Ophthalmology;  Laterality: Right;  . HOT HEMOSTASIS N/A 08/23/2018   Procedure: HOT HEMOSTASIS (ARGON PLASMA COAGULATION/BICAP);  Surgeon: Irving Copas., MD;  Location: [facility];  Service: Gastroenterology;  Laterality: N/A;  . PARS PLANA VITRECTOMY Right 07/02/2013   Procedure: PARS PLANA VITRECTOMY WITH 23 GAUGE WITH MEMBRANE PEEL AND ENDOLASER AND SILICONE OIL;  Surgeon: Adonis Brook, MD;  Location: [facility];  Service: Ophthalmology;  Laterality: Right;  . POLYPECTOMY  08/23/2018   Procedure: POLYPECTOMY;  Surgeon: Mansouraty, Telford Nab., MD;  Location: [place];  Service: Gastroenterology;;  . SCLERAL BUCKLE Right 03/15/2013   Procedure: SCLERAL BUCKLE with 23Ga Vit;  Surgeon: Adonis Brook, MD;  Location: [place];  Service: Ophthalmology;  Laterality: Right;        Home Medications    Prior to Admission medications   Medication Sig Start Date End Date Taking? Authorizing Provider  COMBIGAN 0.2-0.5 % ophthalmic solution Place 1 drop into both eyes 2 (two) times daily. 04/24/16   [provider]  cyclopentolate (CYCLODRYL,CYCLOGYL) 1 % ophthalmic solution Place 1 drop into both eyes daily. 05/21/18   [provider]  Ferrous Sulfate (IRON) 325 (65 Fe) MG TABS Take 1 tablet (325 mg total) by mouth daily. 08/25/18   Rehman, Areeg N, DO  furosemide (LASIX) 40 MG tablet Take 1 tablet (40 mg total) by mouth daily. 09/06/18   Seawell, Jaimie A, DO  gabapentin (NEURONTIN) 300 MG capsule Take 2 capsules (600 mg total) by mouth 3 (three) times daily. 04/19/18   [name], MD  hydrocortisone cream 1 % Apply to itchy area 2 times daily Patient taking differently: Apply 1 application topically 2 (two) times daily.  12/17/17 12/17/18  [name], MD  LUMIGAN 0.01 % SOLN  08/27/18   [provider]  Na Sulfate-K Sulfate-Mg Sulf 17.5-3.13-1.6 GM/177ML SOLN Suprep-Use as directed 09/13/18   Willia Craze, NP  oxyCODONE-acetaminophen (PERCOCET) 10-325 MG tablet Take 1 tablet by mouth every 8 (eight) hours as needed for pain. 10/22/18 01/05/19  Lucious Groves, DO  pantoprazole (PROTONIX) 40 MG tablet Take 1 tablet (40 mg total) by mouth 2  (two) times daily. 10/25/18   Lucious Groves, DO  rivaroxaban (XARELTO) 20 MG TABS tablet Take 1 tablet (20 mg total) by mouth daily with supper. 09/17/18   [name], MD  sorbitol 70 % solution Take 15 mLs by mouth daily as needed. Patient taking differently: Take 15 mLs by mouth daily as needed (for constipation).  03/23/17   [name], MD  verapamil (CALAN-SR) 180 MG CR tablet Take 1 tablet (180 mg total) by mouth daily. 02/06/18   [name], MD  vitamin B-12 (CYANOCOBALAMIN) 1000 MCG tablet Take 1 tablet (1,000 mcg total) by mouth daily. 03/06/18   [name], MD    Family History Family History  Problem Relation Age of Onset  . Heart disease Mother   . Colon cancer Father 40  . Kidney disease Brother        on dialysis  . Pancreatic cancer Brother   . Hypertension Sister   . Diabetes Sister   . Kidney disease Brother        on dialysis  . Diabetes Sister   . Colon cancer Brother        2 brothers with colon cancer  . Cancer Daughter 61       Died at age  of 50, unknown type  . Anesthesia problems Neg Hx     Social History Social History   Tobacco Use  . Smoking status: Former Smoker    Packs/day: 0.50    Years: 30.00    Pack years: 15.00    Types: Cigarettes    Last attempt to quit: 11/05/2010    Years since quitting: 7.9  . Smokeless tobacco: Never Used  Substance Use Topics  . Alcohol use: Yes    Alcohol/week: 0.0 standard drinks    Comment: every now and then  . Drug use: No     Allergies   Amlodipine   Review of Systems Review of Systems  Constitutional: Positive for fatigue. Negative for chills and fever.  Cardiovascular: Negative for chest pain.  Gastrointestinal: Negative for abdominal pain, nausea and vomiting.       + for dark stool.   Neurological: Positive for dizziness and light-headedness. Negative for syncope.  All other systems reviewed and are negative.  Physical Exam Updated Vital Signs BP 138/71 (BP Location: Right  Arm)   Pulse 82   Temp 98.5 F (36.9 C) (Oral)   Resp (!) 22   Ht 6\' 2"  (1.88 m)   Wt 117.9 kg   SpO2 100%   BMI 33.38 kg/m   Physical Exam Vitals signs and nursing note reviewed. Exam conducted with a chaperone present.  Constitutional:      General: He is not in acute distress.    Appearance: He is well-developed. He is not toxic-appearing.  HENT:     Head: Normocephalic and atraumatic.  Eyes:     General:        Right eye: No discharge.        Left eye: No discharge.     Conjunctiva/sclera: Conjunctivae normal.  Neck:     Musculoskeletal: Neck supple.  Cardiovascular:     Rate and Rhythm: Normal rate and regular rhythm.  Pulmonary:     Effort: Pulmonary effort is normal. No respiratory distress.     Breath sounds: Normal breath sounds. No wheezing, rhonchi or rales.  Abdominal:     General: There is no distension.     Palpations: Abdomen is soft.     Tenderness: There is no abdominal tenderness. There is no guarding or rebound.  Genitourinary:    Comments: EDT present as chaperone.  DRE w/ brown stool, no melena or gross blood.  Skin:    General: Skin is warm and dry.     Findings: No rash.  Neurological:     General: No focal deficit present.     Mental Status: He is alert.  Psychiatric:        Behavior: Behavior normal.    ED Treatments / Results  Labs (all labs ordered are listed, but only abnormal results are displayed) Labs Reviewed  CBC - Abnormal; Notable for the following components:      Result Value   RBC 3.05 (*)    Hemoglobin 6.2 (*)    HCT 22.1 (*)    MCV 72.5 (*)    MCH 20.3 (*)    MCHC 28.1 (*)    RDW 22.2 (*)    Platelets 402 (*)    All other components within normal limits  COMPREHENSIVE METABOLIC PANEL - Abnormal; Notable for the following components:   Potassium 3.1 (*)    Creatinine, Ser 1.35 (*)    Albumin 3.4 (*)    GFR calc non Af Amer 51 (*)    GFR  calc Af Amer 59 (*)    All other components within normal limits  POC  OCCULT BLOOD, ED - Abnormal; Notable for the following components:   Fecal Occult Bld POSITIVE (*)    All other components within normal limits  SARS CORONAVIRUS 2 (HOSPITAL ORDER, [facility])  TYPE AND SCREEN  PREPARE RBC (CROSSMATCH)    EKG None  Radiology No results found.  Procedures Procedures (including critical care time) CRITICAL CARE Performed by: Kennith Maes   Total critical care time: 35 minutes- Anemia requiring transfusion.   Critical care time was exclusive of separately billable procedures and treating other patients.  Critical care was necessary to treat or prevent imminent or life-threatening deterioration.  Critical care was time spent personally by me on the following activities: development of treatment plan with patient and/or surrogate as well as nursing, discussions with consultants, evaluation of patient's response to treatment, examination of patient, obtaining history from patient or surrogate, ordering and performing treatments and interventions, ordering and review of laboratory studies, ordering and review of radiographic studies, pulse oximetry and re-evaluation of patient's condition.   Medications Ordered in ED Medications  pantoprazole (PROTONIX) injection 40 mg (has no administration in time range)  0.9 %  sodium chloride infusion (has no administration in time range)     Initial Impression / Assessment and Plan / ED Course  I have reviewed the triage vital signs and the nursing notes.  Pertinent labs & imaging results that were available during my care of the patient were reviewed by me and considered in my medical decision making (see chart for details).  Patient presents to the ED for anemia detected on outpatient labs this AM. Recent admission end of February 2020 for GI bleed, enteroscopy & colonoscopy by [facility]- angiectasias s/p APC, incomplete colonoscopy secondary to poor prep. Appears hgb in the  8s and hct in the mid to upper 20s over the past 2 months since discharge. Hgb 7.0 hct 22.7 prompting ER visit. Regarding sxs- continues to have dark stools, some fatigue as well as lightheadedness/dizziness w/ activity- +/- somewhat worse over the past 1 week. Initial vitals WNL. Abdomen nontender. DRE w/ brown stool, no bright red blood or melena. Plan for labs to include type & screen. Anticipate admission.   Hgb 6.2 hct 22.1- decreased from this AM.  Fecal occult positive.  Renal function @ baseline.  Mild hypokalemia @ 3.1  Plan for transfusion & admission w/ GI consultation.   14:29: CONSULT: Discussed w/ Cottie Banda NP w/ [facility]- will consult.   14:30: CONSULT: Discussed w/ Internal medicine residency service- accepts admission.    This is a shared visit with supervising physician Dr. Sedonia Small who has independently evaluated patient & provided guidance in evaluation/management/disposition, in agreement with care   Final Clinical Impressions(s) / ED Diagnoses   Final diagnoses:  Symptomatic anemia  Gastrointestinal hemorrhage, unspecified gastrointestinal hemorrhage type    ED Discharge Orders    None       Amaryllis Dyke, PA-C 11/01/18 1449    Amaryllis Dyke, PA-C 11/01/18 1453    Maudie Flakes, MD 11/02/18 1504

## 2018-11-01 NOTE — Progress Notes (Signed)
NEW ADMISSION NOTE New Admission Note:   Arrival Method: Stretcher Mental Orientation: A&O X4 Telemetry: Initiated Assessment: Completed Skin: WDL IV: WDL Pain: Denies Safety Measures: Safety Fall Prevention Plan has been given, discussed and signed Admission: Completed [facility] Orientation: Patient has been orientated to the room, unit and staff.   Orders have been reviewed and implemented. Will continue to monitor the patient. Call light has been placed within reach and bed alarm has been activated.   Aneta Mins BSN, RN3

## 2018-11-01 NOTE — Consult Note (Addendum)
Referring Provider: Dr. Earna Coder  Primary Care Physician:  Lucious Groves, DO Primary Gastroenterologist:  Dr. Wilfrid Lund   Reason for Consultation:  Anemia  HPI: [name] is a 73 y.o. male with a past history of HTN, left ventricular hypertensive disease, diastolic dysfunction, aortic stenosis, GERD, achalasia previously treated with Botox, stomach, small bowel and colon AVMs, IDA, vitamin B12 deficiency, RLE DVT on Xarelto, previously on Eliquis. He was admitted to the hospital 08/20/2018 with dizziness and shortness of breath. He was found to have significant anemia. Hg 7.4. with baseline Hg 13.6. He reported having darker stool but no frank red blood or melena. He was on Eliquis and ASA due to history of a DVT. An  EGD/small bowel endoscopy showed 2 non bleeding angioectasias to the stomach, duodenum and jejunum. A colonoscopy showed 5 AVMs in the transverse, ascending colon and cecum. All AVMs were treated with APC. One polyp was removed from the sigmoid colon. The colonoscopy resulted in a poor prep. A repeat colonoscopy was recommended as an outpatient but was deferred due to the [place] pandemic. Father with history of colon cancer. His shortness of breath has progressively worsened over the past 4 weeks with associated fatigue. He had outpatient labs done today which showed his Hg dropped to 7.0. He was advised by Dr. Loletha Carrow to present to the ED. He denies having any dysphagia, heartburn or stomach pain. No lower abdominal pain. He has constipation, passes a hard brown stool every 2 to 3 days. No obvious rectal bleeding or melena. He is taking Xarelto at bed time, last dose was 5/7 around 10pm. No NSAIDS. He is taking Ferrous Sulfate 370m QD. No EtoH. Increasing SOB without hemoptysis. No cough. No fever, sweats or chills. He resides in assisted living. He is a poor historian.   ED Course:  WBC 5.9. Hg 6.2. HCT 22.1. MCV 72.5. 1 unit of PRBCs ordered. Vital signs stable.  Na 140.  K 3.1. BUN 19. Cr. 1.35. Alk phos 40. AST 17. ALT 14. T. Bili 0.9.  GI History: 08/23/2018 EGD/small bowel endoscopy: Dilation in the distal esophagus. Hypertonic lower esophageal sphincter. No other gross lesions in esophagus. Two non-bleeding angioectasias in the stomach. Treated with APC. A single non-bleeding angioectasia in the duodenum.Treated with APC. Two non-bleeding angioectasias in the jejunum. Treated with APC. Normal mucosa was found in the proximal jejunum with distal extent of SBE  08/23/2018 Colonoscopy: Hemorrhoids, large amount of semi liquid stool in the entire colon precluding visualization, 5 small angioectasias with typical arborization were found in the transverse, ascending colon and cecum treated with APC. A 4 mm polyp found in the sigmoid colon, brief oozing and 1 hemostatic clip was placed. Biopsies identified scant vegetable material and not a true polyp. Small diverticulosis to the sigmoid and descending colon.  07/17/2017 EGD: Dilation in the entire esophagus. Abnormal esophageal motility, established achalasia. Injected with botulinum toxin. Normal stomach and duodenum.  Echo: 09/06/2018: The left ventricle has normal systolic function with an ejection fraction of 60-65%. The cavity size was normal. There is severe concentric left ventricular hypertrophy. The aortic valve has an indeterminate number of cusps Moderate thickening of the aortic valve Moderate calcification of the aortic valve. Aortic valve regurgitation is mild to moderate by color flow Doppler. mild-moderate stenosis of the aortic  valve.  Past Medical History:  Diagnosis Date  . 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  Excellent  symptomatic response to LES Botox injections.   . Anemia 08/20/2018  . AVM (arteriovenous malformation) of colon 08/26/2013   Cecum X 3, without bleeding on colonoscopy February 2015   . B12  deficiency 10/01/2011   Discovered to 2 progressive neurologic pain and dysfunction.  Diagnosis established March 2013.  Requires the following treatment: B12 IM monthly until level normalizes. After that, oral supplementation.   . Cataract   . Chronic venous insufficiency 11/28/2013  . Constipation due to opioid therapy 03/11/2015  . Deep venous thrombosis ([place]) 05/31/2018   Right lower extremity, unprovoked  . Diverticulosis 08/26/2013  . Essential hypertension 06/15/2006  . Family history of malignant neoplasm of gastrointestinal tract 06/30/2013   Father with colon cancer age 73   . Gastroesophageal reflux disease 02/04/2007  . GI bleeding 08/21/2018  . Internal hemorrhoids 08/26/2013  . Left posterior subcapsular cataract 10/03/2013   s/p yag laser capsulotomy 10/03/2013   . 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  Complicated by chronic low back pain and neuropathy.  Nerve conduction study (10/19/11): Diffusely low motor amplitudes, with primarily involvement of the peroneal and posterior tibial nerves. No evidence of generalized peripheral neuropathy. Findings could be c/w bil multilevel lumbosacral radiculopathies or a primary motor neuropathy.   . Mild aortic valve stenosis 10/09/2006   Echo (12/09/2009): Valve area: 2.03 cm2   . Neuropathy   . Obesity, Class I, BMI 30-34.9 04/05/2012  . Onychomycosis of toenail 06/05/2014  . Osteoarthritis 11/25/2009   Right knee (medial compartment), right hip, left shoulder   . 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 Trigger little finger of right hand 03/11/2015    Past Surgical History:  Procedure Laterality Date  . BOTOX INJECTION N/A 09/24/2013   Procedure: BOTOX INJECTION;  Surgeon: Inda Castle, MD;  Location: [facility];  Service: Endoscopy;   Laterality: N/A;  . BOTOX INJECTION N/A 07/17/2017   Procedure: BOTOX INJECTION;  Surgeon: Doran Stabler, MD;  Location: [facility];  Service: Gastroenterology;  Laterality: N/A;  . BOTOX INJECTION N/A 04/15/2018   Procedure: BOTOX INJECTION;  Surgeon: Doran Stabler, MD;  Location: [facility];  Service: Gastroenterology;  Laterality: N/A;  . CAPSULOTOMY Left 10/03/2013   Procedure: YAG CAPSULOTOMY OF LEFT EYE;  Surgeon: Myrtha Mantis., MD;  Location: [place];  Service: Ophthalmology;  Laterality: Left;  . CATARACT EXTRACTION  05/01/2012   left eye  . CATARACT EXTRACTION W/PHACO  11/08/2011   Procedure: CATARACT EXTRACTION PHACO AND INTRAOCULAR LENS PLACEMENT (IOC);  Surgeon: Adonis Brook, MD;  Location: [place];  Service: Ophthalmology;  Laterality: Right;  . CATARACT EXTRACTION W/PHACO  05/01/2012   Procedure: CATARACT EXTRACTION PHACO AND INTRAOCULAR LENS PLACEMENT (IOC);  Surgeon: Adonis Brook, MD;  Location: [place];  Service: Ophthalmology;  Laterality: Left;  . COLONOSCOPY WITH PROPOFOL N/A 08/23/2018   Procedure: COLONOSCOPY WITH PROPOFOL;  Surgeon: Rush Landmark Telford Nab., MD;  Location: [place];  Service: Gastroenterology;  Laterality: N/A;  . colonscopy    . ENTEROSCOPY N/A 08/23/2018   Procedure: ENTEROSCOPY;  Surgeon: Mansouraty, Telford Nab., MD;  Location: [place];  Service: Gastroenterology;  Laterality: N/A;  . ESOPHAGEAL MANOMETRY N/A 08/25/2013   Procedure: ESOPHAGEAL MANOMETRY (EM);  Surgeon: Inda Castle, MD;  Location: [facility];  Service: Endoscopy;  Laterality: N/A;  .  ESOPHAGOGASTRODUODENOSCOPY N/A 09/24/2013   Procedure: ESOPHAGOGASTRODUODENOSCOPY (EGD);  Surgeon: Inda Castle, MD;  [facility];  Service: Endoscopy;  Laterality: N/A;  . ESOPHAGOGASTRODUODENOSCOPY N/A 02/17/2014   Procedure: ESOPHAGOGASTRODUODENOSCOPY (EGD);  Surgeon: Inda Castle, MD;  Location: [facility];  Service: Endoscopy;  Laterality: N/A;  .  ESOPHAGOGASTRODUODENOSCOPY (EGD) WITH PROPOFOL N/A 07/17/2017   Procedure: ESOPHAGOGASTRODUODENOSCOPY (EGD) WITH PROPOFOL;  Surgeon: Doran Stabler, MD;  Location: [facility];  Service: Gastroenterology;  Laterality: N/A;  with BOTOX  . ESOPHAGOGASTRODUODENOSCOPY (EGD) WITH PROPOFOL N/A 04/15/2018   Procedure: ESOPHAGOGASTRODUODENOSCOPY (EGD) WITH PROPOFOL;  Surgeon: Doran Stabler, MD;  Location: [facility];  Service: Gastroenterology;  Laterality: N/A;  . EYE SURGERY     cat ext right  . FRACTURE SURGERY     left shoulder  . GAS/FLUID EXCHANGE Right 07/02/2013   Procedure: GAS/FLUID EXCHANGE;  Surgeon: Adonis Brook, MD;  Location: [place];  Service: Ophthalmology;  Laterality: Right;  . HOT HEMOSTASIS N/A 08/23/2018   Procedure: HOT HEMOSTASIS (ARGON PLASMA COAGULATION/BICAP);  Surgeon: Irving Copas., MD;  Location: [place];  Service: Gastroenterology;  Laterality: N/A;  . PARS PLANA VITRECTOMY Right 07/02/2013   Procedure: PARS PLANA VITRECTOMY WITH 23 GAUGE WITH MEMBRANE PEEL AND ENDOLASER AND SILICONE OIL;  Surgeon: Adonis Brook, MD;  Location: [place];  Service: Ophthalmology;  Laterality: Right;  . POLYPECTOMY  08/23/2018   Procedure: POLYPECTOMY;  Surgeon: Mansouraty, Telford Nab., MD;  Location: [place];  Service: Gastroenterology;;  . SCLERAL BUCKLE Right 03/15/2013   Procedure: SCLERAL BUCKLE with 23Ga Vit;  Surgeon: Adonis Brook, MD;  Location: [place];  Service: Ophthalmology;  Laterality: Right;    Prior to Admission medications   Medication Sig Start Date End Date Taking? Authorizing Provider  COMBIGAN 0.2-0.5 % ophthalmic solution Place 1 drop into both eyes 2 (two) times daily. 04/24/16  Yes [provider]  Ferrous Sulfate (IRON) 325 (65 Fe) MG TABS Take 1 tablet (325 mg total) by mouth daily. 08/25/18  Yes Rehman, Areeg N, DO  furosemide (LASIX) 40 MG tablet Take 1 tablet (40 mg total) by mouth daily. 09/06/18  Yes Seawell, Jaimie A, DO  gabapentin  (NEURONTIN) 300 MG capsule Take 2 capsules (600 mg total) by mouth 3 (three) times daily. Patient taking differently: Take 600 mg by mouth 2 (two) times daily.  04/19/18  Yes [name], MD  oxyCODONE-acetaminophen (PERCOCET) 10-325 MG tablet Take 1 tablet by mouth every 8 (eight) hours as needed for pain. 10/22/18 01/05/19 Yes Lucious Groves, DO  pantoprazole (PROTONIX) 40 MG tablet Take 1 tablet (40 mg total) by mouth 2 (two) times daily. 10/25/18  Yes Lucious Groves, DO  rivaroxaban (XARELTO) 20 MG TABS tablet Take 1 tablet (20 mg total) by mouth daily with supper. 09/17/18  Yes [name], MD  sorbitol 70 % solution Take 15 mLs by mouth daily as needed. Patient taking differently: Take 15 mLs by mouth daily as needed (for constipation).  03/23/17  Yes [name], MD  verapamil (CALAN-SR) 180 MG CR tablet Take 1 tablet (180 mg total) by mouth daily. 02/06/18  Yes [name], MD  vitamin B-12 (CYANOCOBALAMIN) 1000 MCG tablet Take 1 tablet (1,000 mcg total) by mouth daily. 03/06/18  Yes [name], MD  hydrocortisone cream 1 % Apply to itchy area 2 times daily Patient taking differently: Apply 1 application topically 2 (two) times daily.  12/17/17 12/17/18  [name], MD  LUMIGAN 0.01 % SOLN  08/27/18  [provider]  Na Sulfate-K Sulfate-Mg Sulf 17.5-3.13-1.6 GM/177ML SOLN Suprep-Use as directed 09/13/18   Willia Craze, NP    Current Facility-Administered Medications  Medication Dose Route Frequency Provider Last Rate Last Dose  . 0.9 %  sodium chloride infusion  10 mL/hr Intravenous Once Petrucelli, Samantha R, PA-C      . pantoprazole (PROTONIX) injection 40 mg  40 mg Intravenous Once Petrucelli, Samantha R, PA-C       Current Outpatient Medications  Medication Sig Dispense Refill  . COMBIGAN 0.2-0.5 % ophthalmic solution Place 1 drop into both eyes 2 (two) times daily.    . Ferrous Sulfate (IRON) 325 (65 Fe) MG TABS Take 1 tablet (325 mg total) by mouth  daily. 30 each 0  . furosemide (LASIX) 40 MG tablet Take 1 tablet (40 mg total) by mouth daily. 30 tablet 2  . gabapentin (NEURONTIN) 300 MG capsule Take 2 capsules (600 mg total) by mouth 3 (three) times daily. (Patient taking differently: Take 600 mg by mouth 2 (two) times daily. ) 540 capsule 3  . oxyCODONE-acetaminophen (PERCOCET) 10-325 MG tablet Take 1 tablet by mouth every 8 (eight) hours as needed for pain. 75 tablet 0  . pantoprazole (PROTONIX) 40 MG tablet Take 1 tablet (40 mg total) by mouth 2 (two) times daily. 180 tablet 0  . rivaroxaban (XARELTO) 20 MG TABS tablet Take 1 tablet (20 mg total) by mouth daily with supper. 90 tablet 3  . sorbitol 70 % solution Take 15 mLs by mouth daily as needed. (Patient taking differently: Take 15 mLs by mouth daily as needed (for constipation). ) 3840 mL 3  . verapamil (CALAN-SR) 180 MG CR tablet Take 1 tablet (180 mg total) by mouth daily. 90 tablet 3  . vitamin B-12 (CYANOCOBALAMIN) 1000 MCG tablet Take 1 tablet (1,000 mcg total) by mouth daily. 90 tablet 3  . hydrocortisone cream 1 % Apply to itchy area 2 times daily (Patient taking differently: Apply 1 application topically 2 (two) times daily. ) 45 g 1  . LUMIGAN 0.01 % SOLN     . Na Sulfate-K Sulfate-Mg Sulf 17.5-3.13-1.6 GM/177ML SOLN Suprep-Use as directed 354 mL 0    Allergies as of 11/01/2018 - Review Complete 11/01/2018  Allergen Reaction Noted  . Amlodipine Swelling and Other (See Comments) 02/25/2016    Family History  Problem Relation Age of Onset  . Heart disease Mother   . Colon cancer Father 57  . Kidney disease Brother        on dialysis  . Pancreatic cancer Brother   . Hypertension Sister   . Diabetes Sister   . Kidney disease Brother        on dialysis  . Diabetes Sister   . Colon cancer Brother        2 brothers with colon cancer  . Cancer Daughter 53       Died at age of 73, unknown type  . Anesthesia problems Neg Hx     Social History   Socioeconomic  History  . Marital status: Single    Spouse name: Not on file  . Number of children: Not on file  . Years of education: Not on file  . Highest education level: Not on file  Occupational History  . Not on file  Social Needs  . Financial resource strain: Not on file  . Food insecurity:    Worry: Not on file    Inability: Not on file  . Transportation needs:  Medical: Not on file    Non-medical: Not on file  Tobacco Use  . Smoking status: Former Smoker    Packs/day: 0.50    Years: 30.00    Pack years: 15.00    Types: Cigarettes    Last attempt to quit: 11/05/2010    Years since quitting: 7.9  . Smokeless tobacco: Never Used  Substance and Sexual Activity  . Alcohol use: Yes    Alcohol/week: 0.0 standard drinks    Comment: every now and then  . Drug use: No  . Sexual activity: Never    Comment: quit in 2014  Lifestyle  . Physical activity:    Days per week: Not on file    Minutes per session: Not on file  . Stress: Not on file  Relationships  . Social connections:    Talks on phone: Not on file    Gets together: Not on file    Attends religious service: Not on file    Active member of club or organization: Not on file    Attends meetings of clubs or organizations: Not on file    Relationship status: Not on file  . Intimate partner violence:    Fear of current or ex partner: Not on file    Emotionally abused: Not on file    Physically abused: Not on file    Forced sexual activity: Not on file  Other Topics Concern  . Not on file  Social History Narrative  . Not on file    Review of Systems: Gen: Denies fever, sweat, chills. No weight loss. CV: Denies chest pain or palpitations. Resp: + SOB. See HPI. GI: Denies vomiting blood, jaundice, and fecal incontinence.   Denies dysphagia or odynophagia. GU : No dysuria or hematuria. MS: No new joint pain of muscle pain.  Derm: Denies rash. Psych: Denies depression or anxiety.  Heme: Denies bruising, bleeding.  Neuro:  + dizziness, denies visual changes.  Physical Exam: Vital signs in last 24 hours: Temp:  [98.5 F (36.9 C)] 98.5 F (36.9 C) (05/08 1249) Pulse Rate:  [82-87] 82 (05/08 1253) Resp:  [20-22] 22 (05/08 1253) BP: (138)/(71) 138/71 (05/08 1249) SpO2:  [100 %] 100 % (05/08 1253) Weight:  [117.9 kg] 117.9 kg (05/08 1247)   General:   Alert,  Well-developed, well-nourished, pleasant and cooperative in NAD Head:  Normocephalic and atraumatic. Eyes:  Sclera clear, no icterus. Pupils irregular with cataracts.  Ears:  Normal auditory acuity. Nose:  No deformity, discharge,  or lesions. Mouth:  No deformity or lesions.   Neck:  Supple; no masses or thyromegaly. Lungs:  Clear throughout. Heart:  S5,K5, 3/6 systolic murmur. Abdomen: protuberant Rectal:  Deferred, rectal exam completed by ED physician, described as brown guaiac +.  Pulses:  Normal pulses noted. Extremities: Bilateral lower extremities 1+ edema  Neurologic:  Alert and  oriented x4;  grossly normal neurologically. Skin:  Intact without significant lesions or rashes.. Psych:  Alert and cooperative. Normal mood and affect.  Intake/Output from previous day: No intake/output data recorded. Intake/Output this shift: No intake/output data recorded.  Lab Results: Recent Labs    11/01/18 0858 11/01/18 1324  WBC 6.4 5.9  HGB 7.0 cL* 6.2*  HCT 22.7 cL* 22.1*  PLT 415.0* 402*   BMET Recent Labs    11/01/18 1324  NA 140  K 3.1*  CL 102  CO2 26  GLUCOSE 80  BUN 19  CREATININE 1.35*  CALCIUM 9.7   LFT Recent Labs  11/01/18 1324  PROT 6.6  ALBUMIN 3.4*  AST 17  ALT 14  ALKPHOS 40  BILITOT 0.9    IMPRESSION/PlAN:   1. 73 yr old male with hx of UGI, small bowel and colon AVMs presents with anemia, dizziness and SOB. Hx of DVT on Xarelto. Last dose of Xarelto was  5/7 at 10pm.  Hg 7.0 done in outpatient clinic today. Hg dropped to 6.2 in ED. Baseline Hg 3/9 was 8.5. -agree with transfusion 1 unit  PRBCs -repeat H/H post transfusion and in am -will need repeat EGD/push enteroscopy and colonoscopy on Monday 11/04/2018, 2 day prep. -Clear liquid diet -PPI 75m IV QD -monitor for GI bleeding  2. Achalasia, stable  3. DVT on Xarelto -hold Xarelto for now  4. LVH, diastolic dysfunction and aortic stenosis  -Lasix on hold   5. Hypokalemia -KCL replacement per hospitalist       CNoralyn Pick 11/01/2018, 2:15 PM

## 2018-11-01 NOTE — ED Triage Notes (Signed)
Pt was seen at [facility] long clinic and got his blood drawn. Pt received a phone call today stating his blood count was low and told to come here.

## 2018-11-01 NOTE — ED Notes (Signed)
ED TO INPATIENT HANDOFF REPORT  ED Nurse Name and Phone #: Casee Knepp [phone]  S Name/Age/Gender [name] 73 y.o. male Room/Bed: 039C/039C  Code Status   Code Status: Prior  Home/SNF/Other Home Patient oriented to: self, place, time and situation Is this baseline? Yes   Triage Complete: Triage complete  Chief Complaint Anemia  Triage Note Pt was seen at [facility] long clinic and got his blood drawn. Pt received a phone call today stating his blood count was low and told to come here.   Allergies Allergies  Allergen Reactions  . Amlodipine Swelling and Other (See Comments)    Bilateral lower extremity swelling    Level of Care/Admitting Diagnosis ED Disposition    ED Disposition Condition [facility] Area: [place] [100100]  Level of Care: Telemetry Medical [104]  Covid Evaluation: N/A  Diagnosis: Lower GI bleed [454098]  Admitting Physician: Bosie Helper  Attending Physician: Lucious Groves [[id number]]  Estimated length of stay: past midnight tomorrow  Certification:: I certify this patient will need inpatient services for at least 2 midnights  PT Class (Do Not Modify): Inpatient [101]  PT Acc Code (Do Not Modify): Private [1]       B Medical/Surgery History Past Medical History:  Diagnosis Date  . 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  Excellent symptomatic response to LES Botox injections.   . Anemia 08/20/2018  . AVM (arteriovenous malformation) of colon 08/26/2013   Cecum X 3, without bleeding on colonoscopy February 2015   . B12 deficiency 10/01/2011   Discovered to 2 progressive neurologic pain and dysfunction.  Diagnosis established March 2013.  Requires the following treatment: B12 IM monthly until level normalizes. After that, oral supplementation.   . Cataract   . Chronic venous insufficiency 11/28/2013  .  Constipation due to opioid therapy 03/11/2015  . Deep venous thrombosis ([place]) 05/31/2018   Right lower extremity, unprovoked  . Diverticulosis 08/26/2013  . Essential hypertension 06/15/2006  . Family history of malignant neoplasm of gastrointestinal tract 06/30/2013   Father with colon cancer age 60   . Gastroesophageal reflux disease 02/04/2007  . GI bleeding 08/21/2018  . Internal hemorrhoids 08/26/2013  . Left posterior subcapsular cataract 10/03/2013   s/p yag laser capsulotomy 10/03/2013   . 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  Complicated by chronic low back pain and neuropathy.  Nerve conduction study (10/19/11): Diffusely low motor amplitudes, with primarily involvement of the peroneal and posterior tibial nerves. No evidence of generalized peripheral neuropathy. Findings could be c/w bil multilevel lumbosacral radiculopathies or a primary motor neuropathy.   . Mild aortic valve stenosis 10/09/2006   Echo (12/09/2009): Valve area: 2.03 cm2   . Neuropathy   . Obesity, Class I, BMI 30-34.9 04/05/2012  . Onychomycosis of toenail 06/05/2014  . Osteoarthritis 11/25/2009   Right knee (medial compartment), right hip, left shoulder   . 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 Trigger little finger of right hand 03/11/2015   Past Surgical History:  Procedure Laterality Date  . BOTOX INJECTION N/A 09/24/2013   Procedure: BOTOX INJECTION;  Surgeon: Inda Castle, MD;  Location: [facility];  Service: Endoscopy;  Laterality: N/A;  . BOTOX INJECTION N/A  07/17/2017   Procedure: BOTOX INJECTION;  Surgeon: Doran Stabler, MD;  Location[facility];  Service: Gastroenterology;  Laterality: N/A;  . BOTOX INJECTION N/A 04/15/2018   Procedure: BOTOX INJECTION;  Surgeon: Doran Stabler, MD;   [facility]: [facility];  Service: Gastroenterology;  Laterality: N/A;  . CAPSULOTOMY Left 10/03/2013   Procedure: YAG CAPSULOTOMY OF LEFT EYE;  Surgeon: Myrtha Mantis., MD;  Location: [place];  Service: Ophthalmology;  Laterality: Left;  . CATARACT EXTRACTION  05/01/2012   left eye  . CATARACT EXTRACTION W/PHACO  11/08/2011   Procedure: CATARACT EXTRACTION PHACO AND INTRAOCULAR LENS PLACEMENT (IOC);  Surgeon: Adonis Brook, MD;  Location: [place];  Service: Ophthalmology;  Laterality: Right;  . CATARACT EXTRACTION W/PHACO  05/01/2012   Procedure: CATARACT EXTRACTION PHACO AND INTRAOCULAR LENS PLACEMENT (IOC);  Surgeon: Adonis Brook, MD;  Location: [place];  Service: Ophthalmology;  Laterality: Left;  . COLONOSCOPY WITH PROPOFOL N/A 08/23/2018   Procedure: COLONOSCOPY WITH PROPOFOL;  Surgeon: Rush Landmark Telford Nab., MD;  Location: [place];  Service: Gastroenterology;  Laterality: N/A;  . colonscopy    . ENTEROSCOPY N/A 08/23/2018   Procedure: ENTEROSCOPY;  Surgeon: Mansouraty, Telford Nab., MD;  Location: [place];  Service: Gastroenterology;  Laterality: N/A;  . ESOPHAGEAL MANOMETRY N/A 08/25/2013   Procedure: ESOPHAGEAL MANOMETRY (EM);  Surgeon: Inda Castle, MD;  [facility];  Service: Endoscopy;  Laterality: N/A;  . ESOPHAGOGASTRODUODENOSCOPY N/A 09/24/2013   Procedure: ESOPHAGOGASTRODUODENOSCOPY (EGD);  Surgeon: Inda Castle, MD;  [facility];  Service: Endoscopy;  Laterality: N/A;  . ESOPHAGOGASTRODUODENOSCOPY N/A 02/17/2014   Procedure: ESOPHAGOGASTRODUODENOSCOPY (EGD);  Surgeon: Inda Castle, MD;  [facility];  Service: Endoscopy;  Laterality: N/A;  . ESOPHAGOGASTRODUODENOSCOPY (EGD) WITH PROPOFOL N/A 07/17/2017   Procedure: ESOPHAGOGASTRODUODENOSCOPY (EGD) WITH PROPOFOL;  Surgeon: Doran Stabler, MD;  [facility];  Service: Gastroenterology;  Laterality: N/A;  with BOTOX  . ESOPHAGOGASTRODUODENOSCOPY (EGD) WITH PROPOFOL N/A  04/15/2018   Procedure: ESOPHAGOGASTRODUODENOSCOPY (EGD) WITH PROPOFOL;  Surgeon: Doran Stabler, MD;  [facility];  Service: Gastroenterology;  Laterality: N/A;  . EYE SURGERY     cat ext right  . FRACTURE SURGERY     left shoulder  . GAS/FLUID EXCHANGE Right 07/02/2013   Procedure: GAS/FLUID EXCHANGE;  Surgeon: Adonis Brook, MD;  Location: [place];  Service: Ophthalmology;  Laterality: Right;  . HOT HEMOSTASIS N/A 08/23/2018   Procedure: HOT HEMOSTASIS (ARGON PLASMA COAGULATION/BICAP);  Surgeon: Irving Copas., MD;  Location: [place];  Service: Gastroenterology;  Laterality: N/A;  . PARS PLANA VITRECTOMY Right 07/02/2013   Procedure: PARS PLANA VITRECTOMY WITH 23 GAUGE WITH MEMBRANE PEEL AND ENDOLASER AND SILICONE OIL;  Surgeon: Adonis Brook, MD;  Location: [place];  Service: Ophthalmology;  Laterality: Right;  . POLYPECTOMY  08/23/2018   Procedure: POLYPECTOMY;  Surgeon: Mansouraty, Telford Nab., MD;  Location: [place];  Service: Gastroenterology;;  . SCLERAL BUCKLE Right 03/15/2013   Procedure: SCLERAL BUCKLE with 23Ga Vit;  Surgeon: Adonis Brook, MD;  Location: [place];  Service: Ophthalmology;  Laterality: Right;     A IV Location/Drains/Wounds Patient Lines/Drains/Airways Status   Active Line/Drains/Airways    Name:   Placement date:   Placement time:   Site:   Days:   Peripheral IV 08/21/18 Left;Posterior Forearm   08/21/18    1116    Forearm   72   Peripheral IV 11/01/18 Right Forearm   11/01/18    1415  Forearm   less than 1          Intake/Output Last 24 hours  Intake/Output Summary (Last 24 hours) at 11/01/2018 1458 Last data filed at 11/01/2018 1455 Gross per 24 hour  Intake 315 ml  Output -  Net 315 ml    Labs/Imaging Results for orders placed or performed during the hospital encounter of 11/01/18 (from the past 48 hour(s))  SARS Coronavirus 2 (CEPHEID - Performed in [place] hospital lab), Hosp Order     Status: None   Collection Time:  11/01/18  1:12 PM  Result Value Ref Range   SARS Coronavirus 2 NEGATIVE NEGATIVE    Comment: (NOTE) If result is NEGATIVE SARS-CoV-2 target nucleic acids are NOT DETECTED. The SARS-CoV-2 RNA is generally detectable in upper and lower  respiratory specimens during the acute phase of infection. The lowest  concentration of SARS-CoV-2 viral copies this assay can detect is 250  copies / mL. A negative result does not preclude SARS-CoV-2 infection  and should not be used as the sole basis for treatment or other  patient management decisions.  A negative result may occur with  improper specimen collection / handling, submission of specimen other  than nasopharyngeal swab, presence of viral mutation(s) within the  areas targeted by this assay, and inadequate number of viral copies  (<250 copies / mL). A negative result must be combined with clinical  observations, patient history, and epidemiological information. If result is POSITIVE SARS-CoV-2 target nucleic acids are DETECTED. The SARS-CoV-2 RNA is generally detectable in upper and lower  respiratory specimens dur ing the acute phase of infection.  Positive  results are indicative of active infection with SARS-CoV-2.  Clinical  correlation with patient history and other diagnostic information is  necessary to determine patient infection status.  Positive results do  not rule out bacterial infection or co-infection with other viruses. If result is PRESUMPTIVE POSTIVE SARS-CoV-2 nucleic acids MAY BE PRESENT.   A presumptive positive result was obtained on the submitted specimen  and confirmed on repeat testing.  While 2019 novel coronavirus  (SARS-CoV-2) nucleic acids may be present in the submitted sample  additional confirmatory testing may be necessary for epidemiological  and / or clinical management purposes  to differentiate between  SARS-CoV-2 and other Sarbecovirus currently known to infect humans.  If clinically indicated  additional testing with an alternate test  methodology [phone]) is advised. The SARS-CoV-2 RNA is generally  detectable in upper and lower respiratory sp ecimens during the acute  phase of infection. The expected result is Negative. Fact Sheet for Patients:  StrictlyIdeas.no Fact Sheet for Healthcare Providers: BankingDealers.co.za This test is not yet approved or cleared by the [place] FDA and has been authorized for detection and/or diagnosis of SARS-CoV-2 by FDA under an Emergency Use Authorization (EUA).  This EUA will remain in effect (meaning this test can be used) for the duration of the COVID-19 declaration under Section 564(b)(1) of the Act, 21 U.S.C. section 360bbb-3(b)(1), unless the authorization is terminated or revoked sooner. Performed at [facility], [place] [address], [place], [place] [address]   Type and screen [place]     Status: None (Preliminary result)   Collection Time: 11/01/18  1:21 PM  Result Value Ref Range   ABO/RH(D) B POS    Antibody Screen NEG    Sample Expiration 11/04/2018,2359    Unit Number [id number]    Blood Component Type RED CELLS,LR    Unit  division 00    Status of Unit ISSUED    Transfusion Status OK TO TRANSFUSE    Crossmatch Result      Compatible Performed at [facility], [place] [address], [place], [place] [address]   Prepare RBC     Status: None   Collection Time: 11/01/18  1:21 PM  Result Value Ref Range   Order Confirmation      ORDER PROCESSED BY BLOOD BANK Performed at [facility], [place] [address], [place], [place] [address]   CBC     Status: Abnormal   Collection Time: 11/01/18  1:24 PM  Result Value Ref Range   WBC 5.9 4.0 - 10.5 K/uL   RBC 3.05 (L) 4.22 - 5.81 MIL/uL   Hemoglobin 6.2 (LL) 13.0 - 17.0 g/dL    Comment: REPEATED TO VERIFY Reticulocyte Hemoglobin testing may be clinically indicated, consider ordering this  additional test [id number] THIS CRITICAL RESULT HAS VERIFIED AND BEEN CALLED TO Marne Meline,RN BY ZELDA BEECH ON 05 08 2020 AT 1358, AND HAS BEEN READ BACK.     HCT 22.1 (L) 39.0 - 52.0 %   MCV 72.5 (L) 80.0 - 100.0 fL   MCH 20.3 (L) 26.0 - 34.0 pg   MCHC 28.1 (L) 30.0 - 36.0 g/dL   RDW 22.2 (H) 11.5 - 15.5 %   Platelets 402 (H) 150 - 400 K/uL   nRBC 0.0 0.0 - 0.2 %    Comment: Performed at [facility] [address], [place], [place] [address]  Comprehensive metabolic panel     Status: Abnormal   Collection Time: 11/01/18  1:24 PM  Result Value Ref Range   Sodium 140 135 - 145 mmol/L   Potassium 3.1 (L) 3.5 - 5.1 mmol/L   Chloride 102 98 - 111 mmol/L   CO2 26 22 - 32 mmol/L   Glucose, Bld 80 70 - 99 mg/dL   BUN 19 8 - 23 mg/dL   Creatinine, Ser 1.35 (H) 0.61 - 1.24 mg/dL   Calcium 9.7 8.9 - 10.3 mg/dL   Total Protein 6.6 6.5 - 8.1 g/dL   Albumin 3.4 (L) 3.5 - 5.0 g/dL   AST 17 15 - 41 U/L   ALT 14 0 - 44 U/L   Alkaline Phosphatase 40 38 - 126 U/L   Total Bilirubin 0.9 0.3 - 1.2 mg/dL   GFR calc non Af Amer 51 (L) >60 mL/min   GFR calc Af Amer 59 (L) >60 mL/min   Anion gap 12 5 - 15    Comment: Performed at [facility] [address], [place],  [address]  POC occult blood, ED     Status: Abnormal   Collection Time: 11/01/18  1:24 PM  Result Value Ref Range   Fecal Occult Bld POSITIVE (A) NEGATIVE   No results found.  Pending Labs [employer] (From admission, onward)    Start     Ordered   Signed and Occupational hygienist morning,   R     Signed and Held   Signed and Held  CBC  Tomorrow morning,   R     Signed and Held          Vitals/Pain Today's Vitals   11/01/18 1249 11/01/18 1252 11/01/18 1253 11/01/18 1455  BP: 138/71   126/78  Pulse: 87  82 81  Resp: 20  (!) 22 18  Temp: 98.5 F (36.9 C)  98.2 F (36.8 C)  TempSrc: Oral   Oral  SpO2: 100%  100%   Weight:      Height:      PainSc:  3       Isolation  Precautions No active isolations  Medications Medications  pantoprazole (PROTONIX) injection 40 mg (has no administration in time range)  0.9 %  sodium chloride infusion (has no administration in time range)    Mobility walks Low fall risk   Focused Assessments    R Recommendations: See Admitting Provider Note  Report given to:   Additional Notes:

## 2018-11-01 NOTE — Telephone Encounter (Signed)
Critical labs call.  Hgb- 7.0

## 2018-11-02 DIAGNOSIS — K5790 Diverticulosis of intestine, part unspecified, without perforation or abscess without bleeding: Secondary | ICD-10-CM

## 2018-11-02 DIAGNOSIS — K922 Gastrointestinal hemorrhage, unspecified: Secondary | ICD-10-CM

## 2018-11-02 DIAGNOSIS — I110 Hypertensive heart disease with heart failure: Secondary | ICD-10-CM

## 2018-11-02 DIAGNOSIS — Z9889 Other specified postprocedural states: Secondary | ICD-10-CM

## 2018-11-02 DIAGNOSIS — Z79899 Other long term (current) drug therapy: Secondary | ICD-10-CM

## 2018-11-02 DIAGNOSIS — Q2733 Arteriovenous malformation of digestive system vessel: Secondary | ICD-10-CM

## 2018-11-02 DIAGNOSIS — Z862 Personal history of diseases of the blood and blood-forming organs and certain disorders involving the immune mechanism: Secondary | ICD-10-CM | POA: Diagnosis present

## 2018-11-02 DIAGNOSIS — D500 Iron deficiency anemia secondary to blood loss (chronic): Secondary | ICD-10-CM

## 2018-11-02 DIAGNOSIS — I5032 Chronic diastolic (congestive) heart failure: Secondary | ICD-10-CM

## 2018-11-02 DIAGNOSIS — I350 Nonrheumatic aortic (valve) stenosis: Secondary | ICD-10-CM

## 2018-11-02 DIAGNOSIS — K220 Achalasia of cardia: Secondary | ICD-10-CM

## 2018-11-02 DIAGNOSIS — R011 Cardiac murmur, unspecified: Secondary | ICD-10-CM

## 2018-11-02 DIAGNOSIS — Z8719 Personal history of other diseases of the digestive system: Secondary | ICD-10-CM

## 2018-11-02 LAB — BASIC METABOLIC PANEL
Anion gap: 10 (ref 5–15)
BUN: 14 mg/dL (ref 8–23)
CO2: 26 mmol/L (ref 22–32)
Calcium: 9.4 mg/dL (ref 8.9–10.3)
Chloride: 103 mmol/L (ref 98–111)
Creatinine, Ser: 1.26 mg/dL — ABNORMAL HIGH (ref 0.61–1.24)
GFR calc Af Amer: >60 mL/min (ref >60)
GFR calc non Af Amer: 55 mL/min — ABNORMAL LOW (ref >60)
Glucose, Bld: 84 mg/dL (ref 70–99)
Potassium: 3.9 mmol/L (ref 3.5–5.1)
Sodium: 139 mmol/L (ref 135–145)

## 2018-11-02 LAB — CBC
HCT: 22.9 % — ABNORMAL LOW (ref 39.0–52.0)
HCT: 25.6 % — ABNORMAL LOW (ref 39.0–52.0)
Hemoglobin: 6.9 g/dL — CL (ref 13.0–17.0)
Hemoglobin: 7.7 g/dL — ABNORMAL LOW (ref 13.0–17.0)
MCH: 21.7 pg — ABNORMAL LOW (ref 26.0–34.0)
MCH: 21.8 pg — ABNORMAL LOW (ref 26.0–34.0)
MCHC: 30.1 g/dL (ref 30.0–36.0)
MCHC: 30.1 g/dL (ref 30.0–36.0)
MCV: 72 fL — ABNORMAL LOW (ref 80.0–100.0)
MCV: 72.3 fL — ABNORMAL LOW (ref 80.0–100.0)
Platelets: 367 10*3/uL (ref 150–400)
Platelets: 312 10*3/uL (ref 150–400)
RBC: 3.18 MIL/uL — ABNORMAL LOW (ref 4.22–5.81)
RBC: 3.54 MIL/uL — ABNORMAL LOW (ref 4.22–5.81)
RDW: 22.5 % — ABNORMAL HIGH (ref 11.5–15.5)
RDW: 21.8 % — ABNORMAL HIGH (ref 11.5–15.5)
WBC: 7.2 10*3/uL (ref 4.0–10.5)
WBC: 14.7 10*3/uL — ABNORMAL HIGH (ref 4.0–10.5)
nRBC: 0.3 % — ABNORMAL HIGH (ref 0.0–0.2)
nRBC: 0.8 % — ABNORMAL HIGH (ref 0.0–0.2)

## 2018-11-02 LAB — PREPARE RBC (CROSSMATCH)

## 2018-11-02 MED ORDER — LORAZEPAM 2 MG/ML IJ SOLN
INTRAMUSCULAR | Status: AC
  Filled 2018-11-02: qty 1

## 2018-11-02 MED ORDER — SODIUM CHLORIDE 0.9% IV SOLUTION: Freq: Once | INTRAVENOUS | Status: DC

## 2018-11-02 MED ORDER — LORAZEPAM BOLUS VIA INFUSION
0.5000 mg | Freq: Once | INTRAVENOUS | Status: DC
  Filled 2018-11-02: qty 1

## 2018-11-02 MED ORDER — SODIUM CHLORIDE 0.9 % IV BOLUS
500.0000 mL | Freq: Once | INTRAVENOUS | Status: AC
  Administered 2018-11-02: 500 mL via INTRAVENOUS

## 2018-11-02 MED ORDER — SODIUM CHLORIDE 0.9 % IV SOLN
510.0000 mg | Freq: Once | INTRAVENOUS | Status: AC
  Administered 2018-11-02: 510 mg via INTRAVENOUS
  Filled 2018-11-02: qty 17

## 2018-11-02 NOTE — Progress Notes (Signed)
Chaplain responded to Code Blue. Chaplain provided ministry of presence. No family was present.  Chaplain will be available if needed.  Rev. Tamsen Snider [phone]

## 2018-11-02 NOTE — Progress Notes (Signed)
Patient finished his blood at 1341, started ferraheme, finished taking his post transfusion VS, asked if they were ok, replied they were ok and then he just dazed off, body rigid, not responding, couldn't find pulse, code initiated.  Primary MD at bedside, stopped Iron infusion, pt got pulse, alert, but sluggish and agitated, gave 500 ml bolus.  Notified family [name] Kashuba and explained the situation and answered all questions.  Will continue to monitor.

## 2018-11-02 NOTE — Progress Notes (Signed)
PT Cancellation Note  Patient Details Name: [name] MRN: [id number] DOB: [birth year]   Cancelled Treatment:    Reason Eval/Treat Not Completed: Active bedrest order. Pt currently with strict bed rest orders in place. PT will continue to follow acutely and await updated activity orders prior to initiating evaluation.   [place] 11/02/2018, 7:42 AM

## 2018-11-02 NOTE — Code Documentation (Signed)
  Patient Name: [name]   MRN: [id number]   Date of Birth/ Sex: 05-16-1946 , male      Admission Date: 11/01/2018  Attending Provider: Lucious Groves, DO  Primary Diagnosis: GI bleed   Indication: Pt was in his usual state of health until this PM, when he was noted to be unresponsive. Code blue was subsequently called. At the time of arrival on scene, patient was alert and resposive.   Technical Description:  - CPR performance duration:  2 minutes  - Was defibrillation or cardioversion used? No   - Was external pacer placed? No  - Was patient intubated pre/post CPR? No   Medications Administered: Y = Yes; Blank = No Amiodarone    Atropine    Calcium    Epinephrine    Lidocaine    Magnesium    Norepinephrine    Phenylephrine    Sodium bicarbonate    Vasopressin     Post CPR evaluation:  - Final Status - Was patient successfully resuscitated ? Yes - What is current rhythm? Sinuse - What is current hemodynamic status? Stable  Miscellaneous Information:  - Labs sent, including:   - Primary team notified?  Yes  - Family Notified? Yes  - Additional notes/ transfer status:  Patient had just finished blood transfusion and was receiving Ferriheme when this event happened. Reported to be unresponsive, pulseless with his eye gazed. Received 2 minutes of chest compression by nurse. He was alert on our arrival. Seems to be reaction to Ferriheme. Ferriheme stopped and he received 500 ml Bolus of NS.      Dewayne Hatch, MD  11/02/2018, 6:57 PM

## 2018-11-02 NOTE — Progress Notes (Signed)
   Subjective:  [name] was doing well this morning when seen on rounds. Denies dizziness except when he got up to go to the restroom this morning. He has not had a bowel movement. He denies nausea, abdominal pain or shorntess of breath.   Objective:  Vital signs in last 24 hours: Vitals:   11/01/18 1510 11/01/18 1730 11/01/18 2116 11/02/18 0436  BP: 133/69 126/68 116/75 111/60  Pulse: 79 73 76 67  Resp: (!) 22 20 17 17   Temp: 98.5 F (36.9 C) 97.6 F (36.4 C) 98.1 F (36.7 C) 98 F (36.7 C)  TempSrc: Oral Oral Oral Oral  SpO2: 100% 100% 100% 100%  Weight:   113.9 kg   Height:       Constitution: NAD, supine in bed Cardio: RRR, III/VI systolic murmur RUSB, no  Respiratory: CTA, no wheezing rales or rhonchi Abdominal: MSK: GU: Neuro: Skin:  Assessment/Plan:  Active Problems:   Symptomatic anemia   GI bleed   Arteriovenous malformation of gastrointestinal tract  [name] is a 73 yo male with PMH achalasia, colonic AVMs with recurrent lower GI bleeds, diverticulosis HTN, HTN, HFpEF, aortic stenosis,  History of GI bleed most recently admitted in February of this year. Colonoscopy showed 5 AVMs tx with APC, planned repeat 2/2 poor prep but this has not yet been done. . Prior colonoscopy 2015 showed 3 AVMS without bleeding. Last EGD during this admission 07/2018 showed non-bleeding angiotectasias in the stomach, duodenum, and jejunum that were treated with argon plasma coagulation as well.   GI Bleed  Transfused 1U pRBCs last night. Repeat hemoglobin 7.2 afterward but 6.9 this am. Symptomatic with dizziness on walking which has been ongoing since admission in February for previous GI bleed. GI is on board and plan for EGD/colonoscopy on 5/11 with 2 day prep as colonoscopy during last admission had poor prep and to allow washout of Xarelto.   - GI consulted, appreciate recommendations  - transfuse 1U pRBCs - post transfusion CBC - bid CBCs - IV iron infusion,  feraheme 500mg  - discussed patient with Cardiologist on call. ECHO and EKG reviewed. As he is not in active heart failure and has only mild aortic stenosis as seen on most recent Echo in March, he is medically stable for EGD and colonoscopy procedure.  - cont. PPI, B12 - PT ordered  - CLD - SCDs   Chronic Diastolic Heart Failure Hypertension Holding lasix 40 mg qd and verapamil. He is normotensive and does not appear volume up.   - continue holding lasix and verapamil  - strict ins/outs - continue to monitor volume status, reassess in the am - am BMP   VTE: SCDs IVF: none Diet: CLD Code: full   Dispo: Anticipated discharge pending colonoscopy and EGD findings.   Shawn Heck, DO 11/02/2018, 6:08 AM Pager: [phone]

## 2018-11-02 NOTE — Plan of Care (Signed)
  Problem: Clinical Measurements: Goal: Cardiovascular complication will be avoided Outcome: Progressing   Problem: Pain Managment: Goal: General experience of comfort will improve Outcome: Progressing   

## 2018-11-02 NOTE — Progress Notes (Signed)
      Gastroenterology Progress Note  CC:  Anemia   Subjective: No abdominal pain. No BM today. Tolerating clear liquids.   Objective:  Vital signs in last 24 hours: Temp:  [97.6 F (36.4 C)-98.5 F (36.9 C)] 98.2 F (36.8 C) (05/09 1100) Pulse Rate:  [67-81] 69 (05/09 1100) Resp:  [16-22] 18 (05/09 1100) BP: (111-133)/(60-78) 114/65 (05/09 1100) SpO2:  [94 %-100 %] 100 % (05/09 1100) Weight:  [113.9 kg] 113.9 kg (05/08 2116) Last BM Date: 10/31/18 General:   Alert,  Well-developed,    in NAD Heart:  Systolic murmur. Pulm: Lungs clear, diminished bases. Abdomen:  Protuberant. Soft, nontender and nondistended. Normal bowel sounds, without guarding, and without rebound.   Extremities:  Trace edema bilateral LEs. Neurologic:  Alert and  oriented x4;  grossly normal neurologically. Psych:  Alert and cooperative. Normal mood and affect.  Intake/Output from previous day: 05/08 0701 - 05/09 0700 In: 795 [P.O.:480; Blood:315] Out: 0  Intake/Output this shift: Total I/O In: -  Out: 550 [Urine:550]  Lab Results: Recent Labs    11/01/18 1324 11/01/18 2030 11/02/18 0416  WBC 5.9 7.3 7.2  HGB 6.2* 7.2* 6.9*  HCT 22.1* 24.7* 22.9*  PLT 402* 420* 367   BMET Recent Labs    11/01/18 1324 11/02/18 0416  NA 140 139  K 3.1* 3.9  CL 102 103  CO2 26 26  GLUCOSE 80 84  BUN 19 14  CREATININE 1.35* 1.26*  CALCIUM 9.7 9.4   LFT Recent Labs    11/01/18 1324  PROT 6.6  ALBUMIN 3.4*  AST 17  ALT 14  ALKPHOS 40  BILITOT 0.9   PT/INR No results for input(s): LABPROT, INR in the last 72 hours. Hepatitis Panel No results for input(s): HEPBSAG, HCVAB, HEPAIGM, HEPBIGM in the last 72 hours.  No results found.  33. 73 yr old male with hx of UGI, small bowel and colon AVMs presents with anemia, dizziness and SOB. Hx of DVT on Xarelto. Last dose of Xarelto was  5/7 at 10pm.  Hg 7.0 done in outpatient clinic today. Hg dropped to 6.2 in ED. He received 1 unit PRBCs. Hg  today 6.9. Receiving 2nd unit of PRBCs now. -repeat EGD/push enteroscopy and colonoscopy on Monday 11/04/2018, 2 day prep. -Clear liquid diet -PPI 40mg  IV QD -monitor for GI bleeding  2. Achalasia, stable  3. DVT on Xarelto -hold Xarelto for now  4. LVH, diastolic dysfunction and aortic stenosis  -Lasix on hold   5. Hypokalemia -KCL replacement per hospitalist     LOS: 1 day   Noralyn Pick  11/02/2018, 1:11 PM

## 2018-11-02 NOTE — Progress Notes (Signed)
CRITICAL VALUE ALERT  Critical Value:  Hemoglobin 6.9  Date & Time Notied:  11/02/2018 @ 04:56  Provider Notified: 11/02/2018 @ 05:13  Orders Received/Actions taken: Order received in epic

## 2018-11-03 DIAGNOSIS — R4020 Unspecified coma: Secondary | ICD-10-CM

## 2018-11-03 DIAGNOSIS — T454X5A Adverse effect of iron and its compounds, initial encounter: Secondary | ICD-10-CM

## 2018-11-03 LAB — CBC
HCT: 24.3 % — ABNORMAL LOW (ref 39.0–52.0)
Hemoglobin: 7.2 g/dL — ABNORMAL LOW (ref 13.0–17.0)
MCH: 21.6 pg — ABNORMAL LOW (ref 26.0–34.0)
MCHC: 29.6 g/dL — ABNORMAL LOW (ref 30.0–36.0)
MCV: 72.8 fL — ABNORMAL LOW (ref 80.0–100.0)
Platelets: 301 10*3/uL (ref 150–400)
RBC: 3.34 MIL/uL — ABNORMAL LOW (ref 4.22–5.81)
RDW: 22.3 % — ABNORMAL HIGH (ref 11.5–15.5)
WBC: 17.4 10*3/uL — ABNORMAL HIGH (ref 4.0–10.5)
nRBC: 0.2 % (ref 0.0–0.2)

## 2018-11-03 LAB — BPAM RBC
Blood Product Expiration Date: 202005102359
Blood Product Expiration Date: 202005192359
ISSUE DATE / TIME: 202005081440
ISSUE DATE / TIME: 202005091034
Unit Type and Rh: 1700
Unit Type and Rh: 7300

## 2018-11-03 LAB — CBC WITH DIFFERENTIAL/PLATELET
Abs Immature Granulocytes: 0.07 10*3/uL (ref 0.00–0.07)
Basophils Absolute: 0 10*3/uL (ref 0.0–0.1)
Basophils Relative: 0 %
Eosinophils Absolute: 0.1 10*3/uL (ref 0.0–0.5)
Eosinophils Relative: 1 %
HCT: 23.9 % — ABNORMAL LOW (ref 39.0–52.0)
Hemoglobin: 7.2 g/dL — ABNORMAL LOW (ref 13.0–17.0)
Immature Granulocytes: 1 %
Lymphocytes Relative: 11 %
Lymphs Abs: 1.7 10*3/uL (ref 0.7–4.0)
MCH: 22 pg — ABNORMAL LOW (ref 26.0–34.0)
MCHC: 30.1 g/dL (ref 30.0–36.0)
MCV: 72.9 fL — ABNORMAL LOW (ref 80.0–100.0)
Monocytes Absolute: 1.1 10*3/uL — ABNORMAL HIGH (ref 0.1–1.0)
Monocytes Relative: 7 %
Neutro Abs: 12.2 10*3/uL — ABNORMAL HIGH (ref 1.7–7.7)
Neutrophils Relative %: 80 %
Platelets: 303 10*3/uL (ref 150–400)
RBC: 3.28 MIL/uL — ABNORMAL LOW (ref 4.22–5.81)
RDW: 22.6 % — ABNORMAL HIGH (ref 11.5–15.5)
WBC: 15.2 10*3/uL — ABNORMAL HIGH (ref 4.0–10.5)
nRBC: 0.3 % — ABNORMAL HIGH (ref 0.0–0.2)

## 2018-11-03 LAB — TYPE AND SCREEN
ABO/RH(D): B POS
Antibody Screen: NEGATIVE
Unit division: 0
Unit division: 0

## 2018-11-03 LAB — BASIC METABOLIC PANEL
Anion gap: 12 (ref 5–15)
BUN: 16 mg/dL (ref 8–23)
CO2: 25 mmol/L (ref 22–32)
Calcium: 9.1 mg/dL (ref 8.9–10.3)
Chloride: 100 mmol/L (ref 98–111)
Creatinine, Ser: 1.36 mg/dL — ABNORMAL HIGH (ref 0.61–1.24)
GFR calc Af Amer: 59 mL/min — ABNORMAL LOW (ref >60)
GFR calc non Af Amer: 51 mL/min — ABNORMAL LOW (ref >60)
Glucose, Bld: 100 mg/dL — ABNORMAL HIGH (ref 70–99)
Potassium: 4.3 mmol/L (ref 3.5–5.1)
Sodium: 137 mmol/L (ref 135–145)

## 2018-11-03 LAB — PHOSPHORUS: Phosphorus: 3.5 mg/dL (ref 2.5–4.6)

## 2018-11-03 LAB — MAGNESIUM: Magnesium: 1.8 mg/dL (ref 1.7–2.4)

## 2018-11-03 MED FILL — Medication: Qty: 1 | Status: AC

## 2018-11-03 NOTE — Progress Notes (Addendum)
     [facility] Progress Note  CC:  Anemia, cardiac arrested 11/02/2018 during Feraheme infusion  Subjective: He complains of mild chest wall tenderness after brief CPR yesterday. No abdominal pain. He passed a large soft brown BM this morning. He is tolerating a clear liquid diet. He is confused, does not clearly understand that he cardiac arrested yesterday. No family at bedside.   Objective:  Vital signs in last 24 hours: Temp:  [98.5 F (36.9 C)-99.4 F (37.4 C)] 98.6 F (37 C) (05/10 0920) Pulse Rate:  [68-120] 68 (05/10 0920) Resp:  [16-26] 20 (05/10 0920) BP: (93-174)/(51-136) 93/51 (05/10 0920) SpO2:  [99 %-100 %] 100 % (05/10 0920) Weight:  [115.2 kg] 115.2 kg (05/10 0343) Last BM Date: 10/31/18 General:   Alert, sitting up in chair in NAD. Speech is soft spoken, words somewhat difficult to understand at times. Heart: Systolic murmur. Pulm: Lungs clear throughout. Abdomen:  Soft, protuberant, mildly distended. Normal bowel sounds, without guarding, and without rebound.   Extremities:  Without edema. Neurologic:  Alert and  oriented x 3;  grossly normal neurologically. Psych:  Alert and cooperative. Normal mood and affect.  Intake/Output from previous day: 05/09 0701 - 05/10 0700 In: 405 [P.O.:90; Blood:315] Out: 900 [Urine:900] Intake/Output this shift: Total I/O In: 360 [P.O.:360] Out: -   Lab Results: Recent Labs    11/02/18 0416 11/02/18 1821 11/03/18 0706  WBC 7.2 14.7* 17.4*  HGB 6.9* 7.7* 7.2*  HCT 22.9* 25.6* 24.3*  PLT 367 312 301   BMET Recent Labs    11/01/18 1324 11/02/18 0416 11/03/18 0706  NA 140 139 137  K 3.1* 3.9 4.3  CL 102 103 100  CO2 26 26 25   GLUCOSE 80 84 100*  BUN 19 14 16   CREATININE 1.35* 1.26* 1.36*  CALCIUM 9.7 9.4 9.1   LFT Recent Labs    11/01/18 1324  PROT 6.6  ALBUMIN 3.4*  AST 17  ALT 14  ALKPHOS 40  BILITOT 0.9   Assessment / Plan:  1. 73 yr old male with hx of UGI, small bowel and  colon AVMs presents with anemia, dizziness and SOB. Hx of DVT on Xarelto. Last dose of Xarelto was 5/7 at 10pm. Hg 7.0 done in outpatient clinic today. Hg dropped to 6.2 in ED. He received 1 unit PRBCs.  5/09 Hg dropped 6.9 He received 2 units of PRBCs. Hg 7.2 today. He cardiac arrested during Feraheme infususion 5/9, he was revived with CPR, no drugs or intubation required. He remains hemodynamically stable.  -2 day bowel prep for EGD/push enteroscopy and colonoscopy dc'd due to cardiac arrest, Dr. Bryan Lemma to verify when prep/procedures to be rescheduled -continue clear liquid diet for now -follow H/H daily -transfuse for Hg < 7 -monitor for active GI bleeding  2. Achalasia, stable  3. DVT on Xarelto -hold Xarelto for now  4. LVH, diastolic dysfunction and aortic stenosis  -Lasix on hold   5. Acute on chronic kidney disease. Cr. 1.36 >> 1/26  6. Leukocytosis. WBC 17.4 >> 14.7 >> 7.2. He is afebrile.  -defer further evaluation to hospitalist, consider Chest XRAY, rule out aspiration secondary to cardiac/respiratory arrest yesterday.  -CBC with diff in am -monitor temperature    LOS: 2 days   Noralyn Pick  11/03/2018, 11:01 AM

## 2018-11-03 NOTE — Evaluation (Signed)
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  He was sent to the emergency department for evaluation of progressive anemia. Coded Nov 17, 2022, CPR for 2 minutes, recovered; considering colonoscopy for 5/11  Clinical Impression   Pt admitted with above diagnosis. Pt currently with functional limitations due to the deficits listed below (see PT Problem List). Walks with a RW typically; Presents with decr functional mobility, decr activity tolerance; But overall seemed happy to be able to walk;  Pt will benefit from skilled PT to increase their independence and safety with mobility to allow discharge to the venue listed below.       Follow Up Recommendations Home health PT;Supervision/Assistance - 24 hour    Equipment Recommendations  3in1 (PT)(may already have)    Recommendations for Other Services OT consult(ordered per protocol)     Precautions / Restrictions Precautions Precautions: Fall Restrictions Weight Bearing Restrictions: No      Mobility  Bed Mobility Overal bed mobility: Needs Assistance Bed Mobility: Rolling;Sidelying to Sit Rolling: Mod assist Sidelying to sit: Mod assist       General bed mobility comments: Light mod assist to pull to sit  Transfers Overall transfer level: Needs assistance Equipment used: Rolling walker (2 wheeled) Transfers: Sit to/from Stand Sit to Stand: Min assist         General transfer comment: Min assist to steady RW; slow moving, and dependent on UEs ppushing on RW,  but not needing phsyical assist to rise  Ambulation/Gait Ambulation/Gait assistance: Min guard;+2  safety/equipment(chair push) Gait Distance (Feet): 70 Feet Assistive device: Rolling walker (2 wheeled) Gait Pattern/deviations: Step-through pattern;Decreased step length - right;Decreased step length - left;Decreased stride length;Trunk flexed     General Gait Details: Cues to self-monitor for activity tolerance  Stairs            Wheelchair Mobility    Modified Rankin (Stroke Patients Only)       Balance                                             Pertinent Vitals/Pain Pain Assessment: No/denies pain    Home Living Family/patient expects to be discharged to:: Assisted living               Home Equipment: Walker - 4 wheels;Other (comment)(walking stick) Additional Comments: Reports aide comes 5x a week M-F for 2.5 hours and nephew assists patient on weekends.     Prior Function Level of Independence: Needs assistance   Gait / Transfers Assistance Needed: Patient reports using a rollator for mobility   ADL's / Homemaking Assistance Needed: Reports aide comes 5x a week M-F for 2.5 hours and assists with ADLs and IADLs. States nephew assists on the weeknd  Comments: Sister and neice may be staying with him -- this needs to be confirmed     Hand Dominance        Extremity/Trunk Assessment   Upper Extremity Assessment Upper Extremity Assessment: Defer to OT evaluation;Generalized weakness    Lower Extremity Assessment Lower Extremity Assessment: Generalized weakness    Cervical / Trunk Assessment Cervical /  Trunk Assessment: Other exceptions Cervical / Trunk Exceptions: He did not mention in PT session, but noted chest wall soreness post CPR in other notes  Communication   Communication: Expressive difficulties(difficult to understand at times)  Cognition Arousal/Alertness: Awake/alert Behavior During Therapy: WFL for tasks assessed/performed Overall Cognitive Status: Within Functional Limits for tasks assessed                                  General Comments: WFL for simple mobility tasks; Did not remenber/understand what happened with code yesterday      General Comments General comments (skin integrity, edema, etc.): Orthostatics taken; BPs on the low side, but no significant changes with position changes; See vitals flow sheet.     Exercises     Assessment/Plan    PT Assessment Patient needs continued PT services  PT Problem List Decreased strength;Decreased activity tolerance;Decreased balance;Decreased mobility;Decreased coordination;Decreased cognition;Decreased knowledge of use of DME;Decreased safety awareness;Decreased knowledge of precautions;Obesity       PT Treatment Interventions DME instruction;Gait training;Stair training;Functional mobility training;Therapeutic activities;Therapeutic exercise;Balance training;Neuromuscular re-education;Cognitive remediation;Patient/family education    PT Goals (Current goals can be found in the Care Plan section)  Acute Rehab PT Goals Patient Stated Goal: Agreeable to walk PT Goal Formulation: With patient Time For Goal Achievement: 11/17/18 Potential to Achieve Goals: Good    Frequency Min 3X/week   Barriers to discharge        Co-evaluation               AM-PAC PT "6 Clicks" Mobility  Outcome Measure Help needed turning from your back to your side while in a flat bed without using bedrails?: A Little Help needed moving from lying on your back to sitting on the side of a flat bed without using bedrails?: A Little Help needed moving to and from a bed to a chair (including a wheelchair)?: A Little Help needed standing up from a chair using your arms (e.g., wheelchair or bedside chair)?: A Little Help needed to walk in hospital room?: A Little Help needed climbing 3-5 steps with a railing? : A Lot 6 Click Score: 17    End of Session Equipment Utilized During Treatment: Gait belt Activity Tolerance: Patient tolerated treatment  well Patient left: in chair;with call bell/phone within reach;with chair alarm set Nurse Communication: Mobility status PT Visit Diagnosis: Unsteadiness on feet (R26.81);Other abnormalities of gait and mobility (R26.89)    Time: 2703-5009 PT Time Calculation (min) (ACUTE ONLY): 26 min   Charges:   PT Evaluation $PT Eval Moderate Complexity: 1 Mod PT Treatments $Gait Training: 8-22 mins        Roney Marion, PT  Acute Rehabilitation Services Pager [phone] Office [place] 11/03/2018, 11:56 AM

## 2018-11-03 NOTE — Progress Notes (Signed)
Internal Medicine Attending:   I saw and examined the patient. I reviewed the resident's note and I agree with the resident's findings and plan as documented in the resident's note.  Patient complaining of some chest soreness after chest compressions yesterday.  Otherwise feels well. Notable events overnight were that after receiving his unit of packed red blood cells he was started on a Feraheme transfusion.  He subsequently became unresponsive and CODE BLUE was called he received maybe 2 minutes of chest compressions.  And first pulse check his pulse was present and he was responsive by the time the [facility] team arrived.  Overall I suspect he did have a severe reaction to Feraheme transfusion.  We will need to use extreme caution if a iron transfusion is needed in the future (would need different product and pretreatment).  I am not completely sure that he truly was pulseless and actually suffered a cardiac arrest from my discussion with the code team.  I did review his last hospitalization it was reported that he had received an IV iron transfusion without incident however in my review of the [facility] from that March hospitalization I cannot actually see that any IV iron was given.  Overall he appears stable at this time.  I discussed with him that his chest tenderness should improve with time.  He does have an elevated white blood cell count his lungs are completely clear to auscultation he has no cough and I do not suspect an aspiration event with such a short unresponsive time.  However we will monitor closely.  Overall we still need to find the source of his ongoing occult blood loss.  Trend CBC daily, transfuse pRBC if Hgb <7.

## 2018-11-03 NOTE — Progress Notes (Addendum)
   Subjective: Yesterday afternoon, IMTS was called to a code blue in Mr. Saiki room. Per RN, he became unresponsive and lost his pulse per RN. He received CPR for 2 minutes, no medications were given. When we arrived to his room he was alert, agitated, non-verbal, attempting to get out of bed. He was receiving Marisue [place], which was stopped. His symptoms resolved almost immediately after stopping ferraheme and he returned back to his baseline in 1-2 minutes. VSS.   This morning, Mr. Ihnen was feeling well and had no acute complaints. He could not recall what happened yesterday. I explained. Also discussed plan for colonoscopy tomorrow.   Objective:  Vital signs in last 24 hours: Vitals:   11/02/18 1356 11/02/18 1646 11/02/18 2346 11/03/18 0343  BP: (!) 174/136 120/69 102/61 114/65  Pulse: (!) 120 92 70 73  Resp: (!) 26 (!) 24 18 18   Temp:  99.2 F (37.3 C) 99.1 F (37.3 C) 98.5 F (36.9 C)  TempSrc:  Oral Oral Oral  SpO2: 99% 100% 100% 100%  Weight:    115.2 kg  Height:       General: elderly male, obese, well-developed, in no acute distress CV: RRR, nl S1/S2, no mrg  Pulm: CTAB, no wheezes or crackles Abd: abdomen is obese, soft, and non-tender, normoactive bowel sounds  Ext:  Warm and well perfused without edema    Assessment/Plan:  Principal Problem:   GI bleed Active Problems:   Symptomatic anemia   Iron deficiency anemia due to chronic blood loss   Chronic heart failure with preserved ejection fraction (HCC)  # GI bleed Remains hemodynamically stable. S/p 2 units of pRBCS during this admission. Hgb this AM 7.2 from 7.7 yesterday after blood transfusion. Will repeat CBC in PM and plan to transfuse if Hgb < 7. Plan for colonoscopy tomorrow.  - GI following, appreciate recommendations  - Continue PPI  - CBC BID - CL diet  - Holding Xarelto, SCDs in place   # Chronic HFpEF # HTN Holding HF and HTN home medications in the setting of GI bleed. Appears euvolemic on  exam, but renal function slightly worsened and 3 lbs up.  - Holding lasix and verapamil, will consider diuresis tomorrow after colonoscopy  - BMP in AM  - Strict I/Os + daily weights   OF NOTE: Patient had a reaction to Feraheme infusion, he did not have a cardiac arrest.   Dispo: Pending colonoscopy tomorrow.   Welford Roche, MD 11/03/2018, 7:10 AM Pager: [phone]

## 2018-11-04 DIAGNOSIS — K31819 Angiodysplasia of stomach and duodenum without bleeding: Secondary | ICD-10-CM

## 2018-11-04 DIAGNOSIS — N183 Chronic kidney disease, stage 3 (moderate): Secondary | ICD-10-CM

## 2018-11-04 DIAGNOSIS — Z86718 Personal history of other venous thrombosis and embolism: Secondary | ICD-10-CM

## 2018-11-04 DIAGNOSIS — I130 Hypertensive heart and chronic kidney disease with heart failure and stage 1 through stage 4 chronic kidney disease, or unspecified chronic kidney disease: Secondary | ICD-10-CM

## 2018-11-04 DIAGNOSIS — R0609 Other forms of dyspnea: Secondary | ICD-10-CM

## 2018-11-04 DIAGNOSIS — Z01810 Encounter for preprocedural cardiovascular examination: Secondary | ICD-10-CM

## 2018-11-04 DIAGNOSIS — D72829 Elevated white blood cell count, unspecified: Secondary | ICD-10-CM

## 2018-11-04 DIAGNOSIS — I5032 Chronic diastolic (congestive) heart failure: Secondary | ICD-10-CM

## 2018-11-04 DIAGNOSIS — Z7901 Long term (current) use of anticoagulants: Secondary | ICD-10-CM

## 2018-11-04 LAB — BASIC METABOLIC PANEL
Anion gap: 7 (ref 5–15)
BUN: 13 mg/dL (ref 8–23)
CO2: 26 mmol/L (ref 22–32)
Calcium: 9.3 mg/dL (ref 8.9–10.3)
Chloride: 104 mmol/L (ref 98–111)
Creatinine, Ser: 1.27 mg/dL — ABNORMAL HIGH (ref 0.61–1.24)
GFR calc Af Amer: >60 mL/min (ref >60)
GFR calc non Af Amer: 55 mL/min — ABNORMAL LOW (ref >60)
Glucose, Bld: 84 mg/dL (ref 70–99)
Potassium: 3.6 mmol/L (ref 3.5–5.1)
Sodium: 137 mmol/L (ref 135–145)

## 2018-11-04 LAB — TROPONIN I
Troponin I: 0.06 ng/mL (ref <0.03)
Troponin I: 0.06 ng/mL (ref <0.03)

## 2018-11-04 LAB — CBC WITH DIFFERENTIAL/PLATELET
Abs Immature Granulocytes: 0 10*3/uL (ref 0.00–0.07)
Basophils Absolute: 0.1 10*3/uL (ref 0.0–0.1)
Basophils Relative: 1 %
Eosinophils Absolute: 0.4 10*3/uL (ref 0.0–0.5)
Eosinophils Relative: 3 %
HCT: 24.8 % — ABNORMAL LOW (ref 39.0–52.0)
Hemoglobin: 7.3 g/dL — ABNORMAL LOW (ref 13.0–17.0)
Lymphocytes Relative: 11 %
Lymphs Abs: 1.3 10*3/uL (ref 0.7–4.0)
MCH: 21.8 pg — ABNORMAL LOW (ref 26.0–34.0)
MCHC: 29.4 g/dL — ABNORMAL LOW (ref 30.0–36.0)
MCV: 74 fL — ABNORMAL LOW (ref 80.0–100.0)
Monocytes Absolute: 0.5 10*3/uL (ref 0.1–1.0)
Monocytes Relative: 4 %
Neutro Abs: 9.6 10*3/uL — ABNORMAL HIGH (ref 1.7–7.7)
Neutrophils Relative %: 81 %
Platelets: 297 10*3/uL (ref 150–400)
RBC: 3.35 MIL/uL — ABNORMAL LOW (ref 4.22–5.81)
RDW: 23.7 % — ABNORMAL HIGH (ref 11.5–15.5)
WBC: 11.9 10*3/uL — ABNORMAL HIGH (ref 4.0–10.5)
nRBC: 0.3 % — ABNORMAL HIGH (ref 0.0–0.2)
nRBC: 1 /100 WBC — ABNORMAL HIGH

## 2018-11-04 MED ORDER — PEG-KCL-NACL-NASULF-NA ASC-C 100 G PO SOLR
0.5000 | Freq: Once | ORAL | Status: AC
  Administered 2018-11-04: 100 g via ORAL
  Filled 2018-11-04: qty 1

## 2018-11-04 MED ORDER — PEG-KCL-NACL-NASULF-NA ASC-C 100 G PO SOLR
0.5000 | Freq: Once | ORAL | Status: AC
  Administered 2018-11-04: 100 g via ORAL

## 2018-11-04 MED ORDER — POLYETHYLENE GLYCOL 3350 17 G PO PACK
17.0000 g | PACK | Freq: Once | ORAL | Status: AC
  Administered 2018-11-04: 17 g via ORAL
  Filled 2018-11-04: qty 1

## 2018-11-04 MED ORDER — PEG-KCL-NACL-NASULF-NA ASC-C 100 G PO SOLR: 1.0000 | Freq: Once | ORAL | Status: DC

## 2018-11-04 NOTE — Progress Notes (Signed)
  Date: 11/04/2018  Patient name: LAIRD RUNNION  Medical record number: [id number]  Date of birth: 28-Jan-1946   I have seen and evaluated this patient and I have discussed the plan of care with the house staff. Please see their note for complete details. I concur with their findings with the following additions/corrections: Mr. Platten was seen this morning on team rounds.  He denies any specific complaints but did ask about when he could eat and how long the process would take..GI may be completing his colonoscopy as early as tomorrow.  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  He also had an indeterminate DVT in the right popliteal vein.  This was diagnosed December 8.  He has been on anticoagulation since then, except when held for his GI bleeding.  Ideally, due to the extent of his DVT and the fact that it was unprovoked, he would be on indefinite anticoagulation.  However, he has already demonstrated he has recurrent GI bleeding and is at high risk.  Therefore, I would recommend we not continue his anticoagulation at discharge.  Ideally, we will get a d-dimer about 4 weeks after discharge and any transfusions.  If it was elevated, it would indicate a higher risk of another VTE and might pressure [place] to resume anticoagulation.  This will need to be an ongoing conversation with his PCP.  Bartholomew Crews, MD 11/04/2018, 11:36 AM

## 2018-11-04 NOTE — Progress Notes (Signed)
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  He was sent to the emergency department for evaluation of progressive anemia. Coded 2022-11-21, CPR for 2 minutes, recovered; considering colonoscopy for 5/11   Clinical Impression   PT admitted with see above. Pt currently with functional limitiations due to the deficits listed below (see OT problem list). Pt requires min (A) for transfers due to poor safety awareness with RW. Pt has caregiver 5 hours per day but otherwise alone. Pt with baseline balance changes per patient. Question if patient is near baseline. Pt reports mild discomfort at chest from rapid response call. Pt states "I dont know much other than a lot of nurses came in here." Pt will benefit from skilled OT to increase their independence and safety with adls and balance to allow discharge hhot.     Follow Up Recommendations  Home health OT    Equipment Recommendations  None recommended by OT    Recommendations for Other Services       Precautions / Restrictions Precautions Precautions: Fall Restrictions Weight Bearing Restrictions: No      Mobility Bed Mobility               General bed mobility comments: up in chair on arrival  Transfers Overall transfer level: Needs assistance Equipment used: Rolling walker (2 wheeled) Transfers: Sit to/from Stand Sit to Stand: Min assist         General transfer comment: Min assist to steady RW; slow moving, and dependent on UEs ppushing on RW,  but not needing phsyical assist to rise    Balance                                           ADL either performed  or assessed with clinical judgement   ADL Overall ADL's : Needs assistance/impaired Eating/Feeding: Modified independent   Grooming: Wash/dry face   Upper Body Bathing: Set up   Lower Body Bathing: Minimal assistance   Upper Body Dressing : Set up   Lower Body Dressing: Minimal assistance   Toilet Transfer: RW;BSC;Minimal assistance Toilet Transfer Details (indicate cue type and reason): due to walking outside RW and abandoned for transfer         Functional mobility during ADLs: Minimal assistance;Rolling walker General ADL Comments: pt demonstrates poor safety wtih RW and abandons RW quickly. pt also with decr control on descend to chair surface     Vision         Perception     Praxis      Pertinent Vitals/Pain Pain Assessment: No/denies pain     Hand Dominance Right   Extremity/Trunk Assessment Upper Extremity Assessment Upper Extremity Assessment: LUE deficits/detail LUE Deficits / Details: AROM less than 70 degrees and reports previosu injury to arm   Lower Extremity Assessment Lower Extremity Assessment: RLE deficits/detail RLE Deficits / Details: reports numbness outside of the leg hip to knee   Cervical / Trunk Assessment Cervical / Trunk Assessment: Kyphotic Cervical / Trunk Exceptions: reports pain at chest   Communication Communication Communication: Expressive difficulties;HOH(difficult to understand at times)   Cognition Arousal/Alertness: Awake/alert  Behavior During Therapy: WFL for tasks assessed/performed Overall Cognitive Status: Within Functional Limits for tasks assessed                                     General Comments       Exercises     Shoulder Instructions      Home Living Family/patient expects to be discharged to:: Assisted living                             Home Equipment: Walker - 4 wheels;Other (comment)(walking stick)   Additional Comments: Reports aide comes 5x a week M-F for 2.5  hours and nephew assists patient on weekends.       Prior Functioning/Environment Level of Independence: Needs assistance  Gait / Transfers Assistance Needed: Patient reports using a rollator for mobility  ADL's / Homemaking Assistance Needed: Reports aide comes 5x a week M-F for 2.5 hours and assists with ADLs and IADLs. States nephew assists on the weeknd   Comments: Sister and neice may be staying with him -- this needs to be confirmed        OT Problem List: Decreased strength;Decreased activity tolerance;Impaired balance (sitting and/or standing);Decreased cognition;Decreased safety awareness;Decreased knowledge of use of DME or AE;Decreased knowledge of precautions;Pain;Impaired sensation      OT Treatment/Interventions: Self-care/ADL training;Therapeutic exercise;Neuromuscular education;Energy conservation;DME and/or AE instruction;Manual therapy;Therapeutic activities;Patient/family education;Balance training    OT Goals(Current goals can be found in the care plan section) Acute Rehab OT Goals Patient Stated Goal: agreeable to OT session   OT Frequency: Min 2X/week   Barriers to D/C:            Co-evaluation              AM-PAC OT "6 Clicks" Daily Activity     Outcome Measure Help from another person eating meals?: None Help from another person taking care of personal grooming?: A Little Help from another person toileting, which includes using toliet, bedpan, or urinal?: A Little Help from another person bathing (including washing, rinsing, drying)?: A Lot Help from another person to put on and taking off regular upper body clothing?: A Little Help from another person to put on and taking off regular lower body clothing?: A Lot 6 Click Score: 17   End of Session Equipment Utilized During Treatment: Gait belt;Rolling walker Nurse Communication: Mobility status;Precautions  Activity Tolerance: Patient tolerated treatment well Patient left: in chair;with call  bell/phone within reach;with chair alarm set  OT Visit Diagnosis: Unsteadiness on feet (R26.81);Muscle weakness (generalized) (M62.81)                Time: 0102-7253 OT Time Calculation (min): 20 min Charges:  OT General Charges $OT Visit: 1 Visit OT Evaluation $OT Eval Moderate Complexity: 1 Mod   Jeri Modena, OTR/L  Acute Rehabilitation Services Pager: [phone] Office: [phone] .   Jeri Modena 11/04/2018, 3:10 PM

## 2018-11-04 NOTE — Progress Notes (Addendum)
Patient ID: [name], male   DOB: 10/25/1945, 73 y.o.   MRN: [id number]    Progress Note   Subjective  Pt says he feels fine , no c/o - says had small brown BM  Hgb 7.3 after 3 units total  Bowel prep held yesterday after pt had what appears to be an adverse rxn to fereheme- unresponsive and Code called- bagged and 2 minutes of chest compressions , no shock required- no cardiac workup   Objective   Vital signs in last 24 hours: Temp:  [97.9 F (36.6 C)-98 F (36.7 C)] 97.9 F (36.6 C) (05/11 0611) Pulse Rate:  [66-86] 66 (05/11 0611) Resp:  [16-18] 16 (05/11 0611) BP: (95-108)/(55-69) 95/55 (05/11 0611) SpO2:  [95 %-100 %] 95 % (05/11 0611) Last BM Date: 11/03/18 General:    AA male ,elderly in NAD Heart:  Regular rate and rhythm; no murmurs Lungs: Respirations even and unlabored, lungs CTA bilaterally Abdomen:  Soft, obese,nontender and nondistended. Normal bowel sounds. Extremities:  Without edema. Neurologic:  Alert and oriented,  grossly normal neurologically. Psych:  Cooperative. Normal mood and affect.  Intake/Output from previous day: 05/10 0701 - 05/11 0700 In: 1560 [P.O.:1560] Out: 800 [Urine:800] Intake/Output this shift: No intake/output data recorded.  Lab Results: Recent Labs    11/03/18 0706 11/03/18 1948 11/04/18 0646  WBC 17.4* 15.2* 11.9*  HGB 7.2* 7.2* 7.3*  HCT 24.3* 23.9* 24.8*  PLT 301 303 297   BMET Recent Labs    11/02/18 0416 11/03/18 0706 11/04/18 0646  NA 139 137 137  K 3.9 4.3 3.6  CL 103 100 104  CO2 26 25 26   GLUCOSE 84 100* 84  BUN 14 16 13   CREATININE 1.26* 1.36* 1.27*  CALCIUM 9.4 9.1 9.3   LFT Recent Labs    11/01/18 1324  PROT 6.6  ALBUMIN 3.4*  AST 17  ALT 14  ALKPHOS 40  BILITOT 0.9   PT/INR No results for input(s): LABPROT, INR in the last 72 hours.     Assessment / Plan:    #1 73 yo male with hx of AVM's of stomach, small bowel and Colon admitted 5/8 with progressive anemia , fe deficiency,and  weakness , hgb 7.0- on Xarelto. Pt had extensive work up 2/20 with APC of gastric ,duodenal and jejunal AVM's , and 5 AVM's transverse colon APC'd, one polyp removed.  Plan was for extended bowel prep then procedures today - however he had an adverse rxn to Fereheme/anaphylactoid- unresponsive and required bagging, chest compression on 5/9 .  He has been hemodynamically stable since , no troponins/cardiac workup  Transfused 3 units since admit with hgb 7.3 today   #2 hx DVT - Xarelto on hold - last dose 5/7- not a good candidate to remain on anticoagulation - hopefully can come off at 3 month mark ..? Repeat doppler  this admit #3 LVH #4 hx achalasia- stable  Plan; Full liquid lunch , then clears Moviprep later today , and will re-prep again tomorrow Miralax  X one now Has been on Dulcolax all weekend Have tentatively scheduled for Colon /Egd /enteroscopy  Wed with Dr Rush Landmark  Will defer to medicine to review  timing of DVT, ? Repeat Doppler , and decide when can stop Xarelto as he will continue to ooze with hx of diffuse bowel AVM's  Pt needs to be cleared from cardiac standpoint prior  to sedation for procedures  Principal Problem:   GI bleed Active Problems:   Symptomatic anemia   Iron deficiency anemia due to chronic blood loss   Chronic heart failure with preserved ejection fraction (HCC)     LOS: 3 days   Manly Nestle  11/04/2018, 9:25 AM

## 2018-11-04 NOTE — Consult Note (Signed)
Cardiology Consultation:   Patient ID: [name]; [id number]; 06-26-1946   Admit date: 11/01/2018 Date of Consult: 11/04/2018  Primary Care Provider: Lucious Groves, DO Primary Cardiologist: New to [place]; Dr. Harrell Gave Primary Electrophysiologist:  None    Patient Profile:   [name] is a 73 y.o. male with a PMH of HTN, chronic diastolic CHF, mild-moderate AS, prior DVT 05/2018 on xarelto, achalasia, and small bowel/ colonic AVMs with previous GI bleeds, who is being seen today for the evaluation of preoperative assessment at the request of Dr. Lynnae January.  History of Present Illness:   Mr. Paci presented after an outpatient lab test revealed a hemoglobin of 7 and he was advised to present to the ED for further evaluation. He was recently admitted to the hospital 2/20202 with a GI bleed. He had not noticed any blood in his stools since discharge but reports some intermittent dark stools. FOTB was positive and Hgb on admission was 6.2. He has been transfused 3 uPRBC this admission and started on an IV PPI. GI consulted and recommended an EGD/C-scope to evaluate for AVMs or additional pathology. Home xarelto was held. On 11/02/2018, patient had an episode of unresponsiveness and was pulseless per RN. He received CPR x2 minutes. It was felt to be 2/2 Ferriheme transfusion reaction which he was receiving at that time as he returned to baseline within 1-2 minutes of stopping ferriheme - not a true cardiac arrest. Cardiology asked to evaluate for preoperative assessment.   Patient has a history of HTN and chronic diastolic CHF. Home verapamil and lasix held as BP has been intermittently soft this admission. He does not follow with a cardiologist. Last ischemic evaluation was a cardiac catheterization in 2003 which showed normal coronary arteries. Last echo in 08/2018 showed EF 60-65%, G3DD, and mild-moderate AS.  He reports that prior to admission he had no complaints of chest  pain. He notes some chest discomfort now which started after he received 2 minutes of CPR 11/02/2018 after an episode of unresponsiveness. This is tenderness on movement and not exertional.  He does note increasing shortness of breath over the last few days. He lives on the second floor of a home and can climb steps if needed, but he is very winded. Denies syncope (with the LOC during the above noted episode the only time he can think of). No PND or orthopnea.  Past Medical History:  Diagnosis Date  . 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  Excellent symptomatic response to LES Botox injections.   . Anemia 08/20/2018  . AVM (arteriovenous malformation) of colon 08/26/2013   Cecum X 3, without bleeding on colonoscopy February 2015   . B12 deficiency 10/01/2011   Discovered to 2 progressive neurologic pain and dysfunction.  Diagnosis established March 2013.  Requires the following treatment: B12 IM monthly until level normalizes. After that, oral supplementation.   . Cataract   . Chronic venous insufficiency 11/28/2013  . Constipation due to opioid therapy 03/11/2015  . Deep venous thrombosis ([place]) 05/31/2018   Right lower extremity, unprovoked  . Diverticulosis 08/26/2013  . Essential hypertension 06/15/2006  . Family history of malignant neoplasm of gastrointestinal tract 06/30/2013   Father with colon cancer age 72   . Gastroesophageal reflux disease 02/04/2007  . GI bleeding 08/21/2018  . Internal hemorrhoids 08/26/2013  . Left posterior subcapsular cataract 10/03/2013   s/p yag laser capsulotomy 10/03/2013   .  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  Complicated by chronic low back pain and neuropathy.  Nerve conduction study (10/19/11): Diffusely low motor  amplitudes, with primarily involvement of the peroneal and posterior tibial nerves. No evidence of generalized peripheral neuropathy. Findings could be c/w bil multilevel lumbosacral radiculopathies or a primary motor neuropathy.   . Mild aortic valve stenosis 10/09/2006   Echo (12/09/2009): Valve area: 2.03 cm2   . Neuropathy   . Obesity, Class I, BMI 30-34.9 04/05/2012  . Onychomycosis of toenail 06/05/2014  . Osteoarthritis 11/25/2009   Right knee (medial compartment), right hip, left shoulder   . 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 Trigger little finger of right hand 03/11/2015    Past Surgical History:  Procedure Laterality Date  . BOTOX INJECTION N/A 09/24/2013   Procedure: BOTOX INJECTION;  Surgeon: Inda Castle, MD;  Location: [facility];  Service: Endoscopy;  Laterality: N/A;  . BOTOX INJECTION N/A 07/17/2017   Procedure: BOTOX INJECTION;  Surgeon: Doran Stabler, MD;  Location: [facility];  Service: Gastroenterology;  Laterality: N/A;  . BOTOX INJECTION N/A 04/15/2018   Procedure: BOTOX INJECTION;  Surgeon: Doran Stabler, MD;  Location: [facility];  Service: Gastroenterology;  Laterality: N/A;  . CAPSULOTOMY Left 10/03/2013   Procedure: YAG CAPSULOTOMY OF LEFT EYE;  Surgeon: Myrtha Mantis., MD;  Location: [place];  Service: Ophthalmology;  Laterality: Left;  . CATARACT EXTRACTION  05/01/2012   left eye  . CATARACT EXTRACTION W/PHACO  11/08/2011   Procedure: CATARACT EXTRACTION PHACO AND INTRAOCULAR LENS PLACEMENT (IOC);  Surgeon: Adonis Brook, MD;  Location: [place];  Service: Ophthalmology;  Laterality: Right;  . CATARACT EXTRACTION W/PHACO  05/01/2012   Procedure: CATARACT EXTRACTION PHACO AND INTRAOCULAR LENS PLACEMENT (IOC);  Surgeon: Adonis Brook, MD;  Location: [place];  Service: Ophthalmology;  Laterality: Left;  . COLONOSCOPY WITH PROPOFOL N/A 08/23/2018   Procedure: COLONOSCOPY WITH PROPOFOL;  Surgeon:  Rush Landmark Telford Nab., MD;  Location: [place];  Service: Gastroenterology;  Laterality: N/A;  . colonscopy    . ENTEROSCOPY N/A 08/23/2018   Procedure: ENTEROSCOPY;  Surgeon: Mansouraty, Telford Nab., MD;  Location: [place];  Service: Gastroenterology;  Laterality: N/A;  . ESOPHAGEAL MANOMETRY N/A 08/25/2013   Procedure: ESOPHAGEAL MANOMETRY (EM);  Surgeon: Inda Castle, MD;  [facility];  Service: Endoscopy;  Laterality: N/A;  . ESOPHAGOGASTRODUODENOSCOPY N/A 09/24/2013   Procedure: ESOPHAGOGASTRODUODENOSCOPY (EGD);  Surgeon: Inda Castle, MD;  [facility];  Service: Endoscopy;  Laterality: N/A;  . ESOPHAGOGASTRODUODENOSCOPY N/A 02/17/2014   Procedure: ESOPHAGOGASTRODUODENOSCOPY (EGD);  Surgeon: Inda Castle, MD;  Location: [facility];  Service: Endoscopy;  Laterality: N/A;  . ESOPHAGOGASTRODUODENOSCOPY (EGD) WITH PROPOFOL N/A 07/17/2017   Procedure: ESOPHAGOGASTRODUODENOSCOPY (EGD) WITH PROPOFOL;  Surgeon: Doran Stabler, MD;  [facility];  Service: Gastroenterology;  Laterality: N/A;  with BOTOX  . ESOPHAGOGASTRODUODENOSCOPY (EGD) WITH PROPOFOL N/A 04/15/2018   Procedure: ESOPHAGOGASTRODUODENOSCOPY (EGD) WITH PROPOFOL;  Surgeon: Doran Stabler, MD;  [facility];  Service: Gastroenterology;  Laterality: N/A;  . EYE SURGERY     cat ext right  . FRACTURE SURGERY     left shoulder  . GAS/FLUID EXCHANGE Right 07/02/2013   Procedure: GAS/FLUID EXCHANGE;  Surgeon: Adonis Brook, MD;  Location: [place];  Service: Ophthalmology;  Laterality: Right;  . HOT HEMOSTASIS N/A 08/23/2018   Procedure: HOT HEMOSTASIS (ARGON PLASMA COAGULATION/BICAP);  Surgeon: Irving Copas., MD;  Location: [place];  Service: Gastroenterology;  Laterality: N/A;  . PARS PLANA VITRECTOMY Right 07/02/2013   Procedure: PARS PLANA VITRECTOMY WITH 23 GAUGE WITH MEMBRANE PEEL AND ENDOLASER AND SILICONE OIL;  Surgeon: Adonis Brook, MD;  Location: [place];   Service: Ophthalmology;  Laterality: Right;  . POLYPECTOMY  08/23/2018   Procedure: POLYPECTOMY;  Surgeon: Mansouraty, Telford Nab., MD;  Location: [place];  Service: Gastroenterology;;  . SCLERAL BUCKLE Right 03/15/2013   Procedure: SCLERAL BUCKLE with 23Ga Vit;  Surgeon: Adonis Brook, MD;  Location: [place];  Service: Ophthalmology;  Laterality: Right;     Home Medications:  Prior to Admission medications   Medication Sig Start Date End Date Taking? Authorizing Provider  COMBIGAN 0.2-0.5 % ophthalmic solution Place 1 drop into both eyes 2 (two) times daily. 04/24/16  Yes [provider]  Ferrous Sulfate (IRON) 325 (65 Fe) MG TABS Take 1 tablet (325 mg total) by mouth daily. 08/25/18  Yes Rehman, Areeg N, DO  furosemide (LASIX) 40 MG tablet Take 1 tablet (40 mg total) by mouth daily. 09/06/18  Yes Seawell, Jaimie A, DO  gabapentin (NEURONTIN) 300 MG capsule Take 2 capsules (600 mg total) by mouth 3 (three) times daily. Patient taking differently: Take 600 mg by mouth 2 (two) times daily.  04/19/18  Yes [name], MD  hydrocortisone cream 1 % Apply to itchy area 2 times daily Patient taking differently: Apply 1 application topically 2 (two) times daily.  12/17/17 12/17/18 Yes [name], MD  oxyCODONE-acetaminophen (PERCOCET) 10-325 MG tablet Take 1 tablet by mouth every 8 (eight) hours as needed for pain. 10/22/18 01/05/19 Yes Lucious Groves, DO  pantoprazole (PROTONIX) 40 MG tablet Take 1 tablet (40 mg total) by mouth 2 (two) times daily. 10/25/18  Yes Lucious Groves, DO  rivaroxaban (XARELTO) 20 MG TABS tablet Take 1 tablet (20 mg total) by mouth daily with supper. 09/17/18  Yes [name], MD  sorbitol 70 % solution Take 15 mLs by mouth daily as needed. Patient taking differently: Take 15 mLs by mouth daily as needed (for constipation).  03/23/17  Yes [name], MD  verapamil (CALAN-SR) 180 MG CR tablet Take 1 tablet (180 mg total) by mouth daily. 02/06/18  Yes [name], MD  vitamin B-12 (CYANOCOBALAMIN) 1000 MCG tablet Take 1 tablet (1,000 mcg total) by mouth daily. 03/06/18  Yes [name], MD    Inpatient Medications: Scheduled Meds: . sodium chloride   Intravenous Once  . brimonidine  1 drop Both Eyes BID   And  . timolol  1 drop Both Eyes BID  . gabapentin  600 mg Oral BID  . pantoprazole  40 mg Oral BID  . peg 3350 powder  0.5 kit Oral Once  . vitamin B-12  1,000 mcg Oral Daily   Continuous Infusions:  PRN Meds: acetaminophen **OR** acetaminophen, oxyCODONE-acetaminophen **AND** oxyCODONE  Allergies:    Allergies  Allergen Reactions  . Feraheme [Ferumoxytol]     Found unresponsive after starting infusion  . Amlodipine Swelling and Other (See Comments)    Bilateral lower extremity swelling    Social History:   Social History   Socioeconomic History  . Marital status: Single    Spouse name: Not on file  . Number of children: Not on file  . Years of education: Not on file  .  Highest education level: Not on file  Occupational History  . Not on file  Social Needs  . Financial resource strain: Not on file  . Food insecurity:    Worry: Not on file    Inability: Not on file  . Transportation needs:    Medical: Not on file    Non-medical: Not on file  Tobacco Use  . Smoking status: Former Smoker    Packs/day: 0.50    Years: 30.00    Pack years: 15.00    Types: Cigarettes    Last attempt to quit: 11/05/2010    Years since quitting: 8.0  . Smokeless tobacco: Never Used  Substance and Sexual Activity  . Alcohol use: Yes    Alcohol/week: 0.0 standard drinks    Comment: every now and then  . Drug use: No  . Sexual activity: Never    Comment: quit in 2014  Lifestyle  . Physical activity:    Days per week: Not on file    Minutes per session: Not on file  . Stress: Not on file  Relationships  . Social connections:    Talks on phone: Not on file    Gets together: Not on file    Attends religious service: Not  on file    Active member of club or organization: Not on file    Attends meetings of clubs or organizations: Not on file    Relationship status: Not on file  . Intimate partner violence:    Fear of current or ex partner: Not on file    Emotionally abused: Not on file    Physically abused: Not on file    Forced sexual activity: Not on file  Other Topics Concern  . Not on file  Social History Narrative  . Not on file    Family History:    Family History  Problem Relation Age of Onset  . Heart disease Mother   . Colon cancer Father 49  . Kidney disease Brother        on dialysis  . Pancreatic cancer Brother   . Hypertension Sister   . Diabetes Sister   . Kidney disease Brother        on dialysis  . Diabetes Sister   . Colon cancer Brother        2 brothers with colon cancer  . Cancer Daughter 27       Died at age of 37, unknown type  . Anesthesia problems Neg Hx      ROS:  Please see the history of present illness. He is a difficult historian, so ROS somewhat limited. Constitutional: Negative for chills, fever, night sweats HENT: Negative for ear pain Eyes: Negative for loss of vision and eye pain.  Respiratory: Negative for cough, sputum   Cardiovascular: See HPI. Gastrointestinal: See HPI Genitourinary: Negative for dysuria and hematuria.  Musculoskeletal: Negative for falls Skin: Negative for itching and rash.  Neurological: Negative for focal weakness, focal sensory changes.  Endo/Heme/Allergies: No allergic reactions All other ROS reviewed and negative.     Physical Exam/Data:   Vitals:   11/03/18 1709 11/03/18 2135 11/04/18 0611 11/04/18 1005  BP: 108/69 (!) 105/57 (!) 95/55 111/71  Pulse: 73 86 66 66  Resp: '18 18 16 18  ' Temp: 98 F (36.7 C) 98 F (36.7 C) 97.9 F (36.6 C) 98.4 F (36.9 C)  TempSrc: Oral Oral Oral Oral  SpO2: 98% 100% 95% 100%  Weight:      Height:  Intake/Output Summary (Last 24 hours) at 11/04/2018 1731 Last data filed at  11/04/2018 1200 Gross per 24 hour  Intake 1320 ml  Output 900 ml  Net 420 ml   Filed Weights   11/01/18 1247 11/01/18 2116 11/03/18 0343  Weight: 117.9 kg 113.9 kg 115.2 kg   Body mass index is 32.61 kg/m.   Physical Exam per MD: General:  Well nourished, well developed, in no acute distress HEENT: sclera anicteric  Lymph: no adenopathy Neck: no JVD Endocrine:  No thryomegaly Vascular: No carotid bruits; distal pulses 2+ bilaterally Cardiac:  normal S1, S2; RRR; 3/6 mid peaking SEM radiating to carotids Lungs:  clear to auscultation bilaterally, no wheezing, rhonchi or rales  Abd: NABS, soft, nontender, no hepatomegaly Ext: no edema Musculoskeletal:  No deformities, moves all 4 limbs independently. Skin: warm and dry  Neuro:  CNs 2-12 intact, no focal abnormalities noted Psych:  Normal affect   EKG:  None this admission Telemetry:  Telemetry was personally reviewed and demonstrates:  NSR with rare PVCs  Relevant CV Studies: Echocardiogram 08/2018: IMPRESSIONS  1. The left ventricle has normal systolic function with an ejection fraction of 60-65%. The cavity size was normal. There is severe concentric left ventricular hypertrophy. Left ventricular diastolic Doppler parameters are consistent with restrictive  filling. No evidence of left ventricular regional wall motion abnormalities.  2. The right ventricle has normal systolic function. The cavity was normal. There is no increase in right ventricular wall thickness. Right ventricular systolic pressure is moderately elevated with an estimated pressure of 47.7 mmHg.  3. Right atrial size was mildly dilated.  4. The pericardial effusion is posterior to the left ventricle.  5. Trivial pericardial effusion is present.  6. Mild thickening of the mitral valve leaflet.  7. The aortic valve has an indeterminate number of cusps Moderate thickening of the aortic valve Moderate calcification of the aortic valve. Aortic valve regurgitation  is mild to moderate by color flow Doppler. mild-moderate stenosis of the aortic  valve.  8. The inferior vena cava was dilated in size with <50% respiratory variability.  Left heart catheterization 2003: CORONARIES: 1. The left main was normal.  2. The LAD was normal.  3. The circumflex was normal.  4. The right coronary artery was normal.  LEFT VENTRICULOGRAM:  A left ventriculogram was obtained in the RAO projection.  The EF was 65% with normal wall motion.  CONCLUSIONS:  Normal coronary arteries.  Normal left ventricular function.   Laboratory Data:  Chemistry Recent Labs  Lab 11/02/18 0416 11/03/18 0706 11/04/18 0646  NA 139 137 137  K 3.9 4.3 3.6  CL 103 100 104  CO2 '26 25 26  ' GLUCOSE 84 100* 84  BUN '14 16 13  ' CREATININE 1.26* 1.36* 1.27*  CALCIUM 9.4 9.1 9.3  GFRNONAA 55* 51* 55*  GFRAA >60 59* >60  ANIONGAP '10 12 7    ' Recent Labs  Lab 11/01/18 1324  PROT 6.6  ALBUMIN 3.4*  AST 17  ALT 14  ALKPHOS 40  BILITOT 0.9   Hematology Recent Labs  Lab 11/03/18 0706 11/03/18 1948 11/04/18 0646  WBC 17.4* 15.2* 11.9*  RBC 3.34* 3.28* 3.35*  HGB 7.2* 7.2* 7.3*  HCT 24.3* 23.9* 24.8*  MCV 72.8* 72.9* 74.0*  MCH 21.6* 22.0* 21.8*  MCHC 29.6* 30.1 29.4*  RDW 22.3* 22.6* 23.7*  PLT 301 303 297   Cardiac Enzymes Recent Labs  Lab 11/04/18 1223  TROPONINI 0.06*   No results for input(s): TROPIPOC  in the last 168 hours.  BNPNo results for input(s): BNP, PROBNP in the last 168 hours.  DDimer No results for input(s): DDIMER in the last 168 hours.  Radiology/Studies:  No results found.  Assessment and Plan:   1. Preoperative assessment: patient presented with acute on chronic anemia, felt to be 2/2 GI bleed given +FOBT and history of small bowel/colonic AVMs and recent admission for GI bleed 07/2018. He is planned for EGD/CScope this admission and cardiology consult was requested for preop assessment.   No complaints of chest pain prior to  admission though notes some soreness after episode of unresponsiveness 11/02/2018 where he received 2 minutes of CPR. Unresponsiveness felt to be a reaction to ferriheme infusion.   He does not have angina with doing 4 METs of exertion, but he does have progressive shortness of breath. Last ischemic evaluation was a cardiac cath in 2003 which revealed normal coronaries. Last echo 08/2018 showed EF 95-62%, LV diastolic dysfunction, and mild-moderate AS.    - Based on the revised cardiac risk index, patient has a score of 1 with a 6% risk of adverse cardiac events in the perioperative setting.  - Patient can proceed with procedures without stress testing. - I suspect that his dyspnea on exertion is more related to his anemia, but with his recent event it is reasonable to repeat an echo. AS murmur does not sound severe, but will confirm with echo. -reviewed telemetry at the time of questionable events. Was normal sinus rhythm, then appeared to have PEA prior to CPR. Lots of artifact during the event, then appears to be in sinus rhythm/sinus tach after. Does not appea  2. Elevated troponin: Trop 0.06 today. Likely demand ischemia in the setting of acute on chronic anemia vs stress response to 2 minutes of CPR 11/02/2018.  - No further cardiac testing at this time.   3. Chronic diastolic CHF: Home lasix on hold given hypotension. Euvolemic on exam. I/Os reveal net +1L this admission. - Continue to monitor volume status closely while intermittently receiving transfusions. Aim to maintain net even volume status - Resume home lasix as needed   4. HTN: BP on the soft side. Home verapamil and lasix on hold. - Resume home medications when BP will allow.   5. Acute on chronic anemia in the setting of acute GI bleed: Hgb 6.2 on admission. FOBT+. S/p 3 uPRBC this admission. Hgb 7.6 today. GI following and planning for EGD/Cscope in the next 1-2 days.  - Continue management per GI and primary team  6. Aortic  Stenosis: mild-moderate on echo 08/2018.  - Continue routine outpatient monitoring.   For questions or updates, please contact [place] Please consult [email or web address] for contact info under Cardiology/STEMI.   Signed, Buford Dresser, MD  11/04/2018 5:31 PM

## 2018-11-04 NOTE — Progress Notes (Signed)
   Subjective:  [name] is doing well this morning. He denies shortness of breath, chest pain or cough. He does have dizziness when walking to the bathroom still. He denies nausea or abdominal pain. He denies fever or chills. He is hungry and asking when he can eat again. Discussed his procedure will likely have to be pushed back due to his reaction to ferraheme over the weekend.   Objective:  Vital signs in last 24 hours: Vitals:   11/03/18 0920 11/03/18 1709 11/03/18 2135 11/04/18 0611  BP: (!) 93/51 108/69 (!) 105/57 (!) 95/55  Pulse: 68 73 86 66  Resp: 20 18 18 16   Temp: 98.6 F (37 C) 98 F (36.7 C) 98 F (36.7 C) 97.9 F (36.6 C)  TempSrc: Oral Oral Oral Oral  SpO2: 100% 98% 100% 95%  Weight:      Height:       Constitution: NAD, supine in bed Cardio: RRR, III/IV RUSB murmur, no JVD Respiratory: CTA, no m/r/g  Abdominal: +BS, soft, non-distended, NTTP  MSK: no LE edema, moving all extremities Skin: no LE open wounds, c/d/i    Assessment/Plan:  Principal Problem:   GI bleed Active Problems:   Symptomatic anemia   Iron deficiency anemia due to chronic blood loss   Chronic heart failure with preserved ejection fraction (HCC)  [name]. 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 Colonoscopyshowed 5 AVMs tx with APC, planned repeat 2/2 poor prep but this has not yet been done. .Prior colonoscopy 2015 showed 3 AVMS without bleeding.Last EGDduring this admission2/2020 showed non-bleeding angiotectasias in the stomach, duodenum, and jejunum that were treated with argon plasma coagulation as well.  GI Bleed Possibly restarting two day bowel prep as code blue was called two days ago after reaction to [facility] where he became unresponsive. He has been stable since then, blood pressures soft this am. He has  received 3U RBCs total, last transfusion two days ago.   - continue CLD - GI consulted, will follow-up recommendations  - am CBC  - transfuse for Hgb <7 - continue PPI  - SCDs  Leukocytosis No clear infectious etiology. No fever or symptoms. Physical exam benign. Possibly secondary to RBC transfusions and Ferraheme reaction. Trending down and almost normalized. Will continue to monitor.   Chronic HFpEF Hypertension Stage III CKD He has been normotensive without his home blood pressure medications and lasix, blood pressures soft today. On chart review after last admission his home meds were held for softer blood pressures. He has no signs of volume overload on exam or symptomatically. Creatinine has been elevated but per chart review this appears likely progression to CKD stage III.   - continue holding lasix, monitor  - continue to hold verapamil   Hx of DVT Continue to hold xarelto. Cont SCDs.    VTE: SCDs IVF: none Diet: CLD Code: full   Dispo: Anticipated discharge pending colonoscoyp, EGD.   Marty Heck, DO 11/04/2018, 6:26 AM Pager: [phone]

## 2018-11-05 ENCOUNTER — Inpatient Hospital Stay (HOSPITAL_COMMUNITY): Payer: Medicare Other

## 2018-11-05 DIAGNOSIS — R7989 Other specified abnormal findings of blood chemistry: Secondary | ICD-10-CM

## 2018-11-05 DIAGNOSIS — I351 Nonrheumatic aortic (valve) insufficiency: Secondary | ICD-10-CM

## 2018-11-05 LAB — CBC
HCT: 27.8 % — ABNORMAL LOW (ref 39.0–52.0)
Hemoglobin: 8.2 g/dL — ABNORMAL LOW (ref 13.0–17.0)
MCH: 22 pg — ABNORMAL LOW (ref 26.0–34.0)
MCHC: 29.5 g/dL — ABNORMAL LOW (ref 30.0–36.0)
MCV: 74.7 fL — ABNORMAL LOW (ref 80.0–100.0)
Platelets: 339 10*3/uL (ref 150–400)
RBC: 3.72 MIL/uL — ABNORMAL LOW (ref 4.22–5.81)
RDW: 24.1 % — ABNORMAL HIGH (ref 11.5–15.5)
WBC: 9.8 10*3/uL (ref 4.0–10.5)
nRBC: 0.4 % — ABNORMAL HIGH (ref 0.0–0.2)

## 2018-11-05 LAB — COMPREHENSIVE METABOLIC PANEL
ALT: 18 U/L (ref 0–44)
AST: 21 U/L (ref 15–41)
Albumin: 3 g/dL — ABNORMAL LOW (ref 3.5–5.0)
Alkaline Phosphatase: 41 U/L (ref 38–126)
Anion gap: 13 (ref 5–15)
BUN: 9 mg/dL (ref 8–23)
CO2: 25 mmol/L (ref 22–32)
Calcium: 9.5 mg/dL (ref 8.9–10.3)
Chloride: 103 mmol/L (ref 98–111)
Creatinine, Ser: 1.23 mg/dL (ref 0.61–1.24)
GFR calc Af Amer: >60 mL/min (ref >60)
GFR calc non Af Amer: 57 mL/min — ABNORMAL LOW (ref >60)
Glucose, Bld: 90 mg/dL (ref 70–99)
Potassium: 3.9 mmol/L (ref 3.5–5.1)
Sodium: 141 mmol/L (ref 135–145)
Total Bilirubin: 1 mg/dL (ref 0.3–1.2)
Total Protein: 6.1 g/dL — ABNORMAL LOW (ref 6.5–8.1)

## 2018-11-05 LAB — ECHOCARDIOGRAM LIMITED
Height: 74 in
Weight: 4091.74 oz

## 2018-11-05 MED ORDER — FUROSEMIDE 10 MG/ML IJ SOLN
20.0000 mg | Freq: Once | INTRAMUSCULAR | Status: AC
  Administered 2018-11-05: 20 mg via INTRAVENOUS
  Filled 2018-11-05: qty 2

## 2018-11-05 MED ORDER — PEG-KCL-NACL-NASULF-NA ASC-C 100 G PO SOLR
1.0000 | Freq: Once | ORAL | Status: AC
  Administered 2018-11-05: 200 g via ORAL
  Filled 2018-11-05 (×2): qty 1

## 2018-11-05 NOTE — Progress Notes (Signed)
Progress Note  Patient Name: [name] Date of Encounter: 11/05/2018  Primary Cardiologist: Buford Dresser, MD new  Subjective   No acute events overnight. Sleeping comfortably in chair today. Reviewed echo, no questions.  Inpatient Medications    Scheduled Meds: . sodium chloride   Intravenous Once  . brimonidine  1 drop Both Eyes BID   And  . timolol  1 drop Both Eyes BID  . gabapentin  600 mg Oral BID  . pantoprazole  40 mg Oral BID  . peg 3350 powder  1 kit Oral Once  . vitamin B-12  1,000 mcg Oral Daily   Continuous Infusions:  PRN Meds: acetaminophen **OR** acetaminophen, oxyCODONE-acetaminophen **AND** oxyCODONE   Vital Signs    Vitals:   11/04/18 1005 11/04/18 1805 11/05/18 0405 11/05/18 0933  BP: 111/71 138/79 136/77 139/90  Pulse: 66 70 75 75  Resp: '18 18 20 18  ' Temp: 98.4 F (36.9 C) 98.1 F (36.7 C) 98.8 F (37.1 C) 98.4 F (36.9 C)  TempSrc: Oral Oral Oral Oral  SpO2: 100%  97% 99%  Weight:  116 kg    Height:        Intake/Output Summary (Last 24 hours) at 11/05/2018 1301 Last data filed at 11/05/2018 0930 Gross per 24 hour  Intake 1800 ml  Output 651 ml  Net 1149 ml   Last 3 Weights 11/04/2018 11/03/2018 11/01/2018  Weight (lbs) 255 lb 11.7 oz 254 lb 251 lb 1.7 oz  Weight (kg) 116 kg 115.214 kg 113.9 kg      Telemetry    9 beats of irregular WCT overnight, otherwise sinus rhythm - Personally Reviewed  ECG    None this admission - Personally Reviewed  Physical Exam   GEN: No acute distress.   Neck: No JVD Cardiac: RRR, no murmurs, rubs, or gallops.  Respiratory: Clear to auscultation bilaterally. GI: Soft, nontender, non-distended  MS: No edema; No deformity. Neuro:  Nonfocal  Psych: Normal affect   Labs    Chemistry Recent Labs  Lab 11/01/18 1324  11/03/18 0706 11/04/18 0646 11/05/18 0331  NA 140   < > 137 137 141  K 3.1*   < > 4.3 3.6 3.9  CL 102   < > 100 104 103  CO2 26   < > '25 26 25  ' GLUCOSE 80   <  > 100* 84 90  BUN 19   < > '16 13 9  ' CREATININE 1.35*   < > 1.36* 1.27* 1.23  CALCIUM 9.7   < > 9.1 9.3 9.5  PROT 6.6  --   --   --  6.1*  ALBUMIN 3.4*  --   --   --  3.0*  AST 17  --   --   --  21  ALT 14  --   --   --  18  ALKPHOS 40  --   --   --  41  BILITOT 0.9  --   --   --  1.0  GFRNONAA 51*   < > 51* 55* 57*  GFRAA 59*   < > 59* >60 >60  ANIONGAP 12   < > '12 7 13   ' < > = values in this interval not displayed.     Hematology Recent Labs  Lab 11/03/18 1948 11/04/18 0646 11/05/18 0331  WBC 15.2* 11.9* 9.8  RBC 3.28* 3.35* 3.72*  HGB 7.2* 7.3* 8.2*  HCT 23.9* 24.8* 27.8*  MCV 72.9* 74.0* 74.7*  MCH 22.0* 21.8* 22.0*  MCHC 30.1 29.4* 29.5*  RDW 22.6* 23.7* 24.1*  PLT 303 297 339    Cardiac Enzymes Recent Labs  Lab 11/04/18 1223 11/04/18 1826  TROPONINI 0.06* 0.06*   No results for input(s): TROPIPOC in the last 168 hours.   BNPNo results for input(s): BNP, PROBNP in the last 168 hours.   DDimer No results for input(s): DDIMER in the last 168 hours.   Radiology    No results found.  Cardiac Studies   Echo today:  1. The left ventricle has normal systolic function, with an ejection fraction of 60-65%. The cavity size was normal. There is severely increased left ventricular wall thickness.  2. The right ventricle has normal systolc function. The cavity was normal.  3. Left atrial size was mildly dilated.  4. The tricuspid valve was grossly normal.  5. The aortic valve has an indeterminate number of cusps Severely thickening of the aortic valve Moderate calcification of the aortic valve. Aortic valve regurgitation is mild by color flow Doppler. Moderate stenosis of the aortic valve.  6. There is mild dilatation of the aortic root measuring 39 mm.  7. Normal LV systolic function; severe LVH; calcified aortic valve with moderate AS by doppler (mean gradeint 21 mmHg) and mild AI; mildly dilated aortic root.  Patient Profile     73 y.o. male PMH of HTN, chronic  diastolic CHF, mild-moderate AS, prior DVT 05/2018 on xarelto, achalasia, and small bowel/ colonic AVMs with previous GI bleeds, who is being seen for the evaluation of preoperative assessment at the request of Dr. Lynnae January.  Assessment & Plan    Preop assessment: Echo largely unchanged from prior. EF preserved, severe LVH (would avoid dehydration, but also predisposed to diastolic HF, so euvolemia is goal), AS moderate.  No further testing recommended. RCRI=1, with 6% risk of adverse events.  [facility] will sign off.   Medication Recommendations:  Anticoagulation not planned to be restarted per notes. Would change furosemide dosing to PRN for weight gain. Has not required verapamil for BP control while inpatient, resume if BP rises. Other recommendations (labs, testing, etc):  none Follow up as an outpatient:  We would be happy to see him as needed in cardiology. My office is at [facility] ([place]) if he needs to be seen in the future.   For questions or updates, please contact [place] Please consult [email or web address] for contact info under     Signed, Buford Dresser, MD  11/05/2018, 1:01 PM

## 2018-11-05 NOTE — Progress Notes (Signed)
   Subjective:  Still has some mild chest pain from chest compressions the other day. He enjoyed his full liquid diet yesterday but still feels hungry. He denies shortness of breath increased from normal, which occurs sometimes when he ambulates. He denies abdominal pain, or nausea. He does not feel that he has increased swelling in his legs.  Objective:  Vital signs in last 24 hours: Vitals:   11/04/18 0611 11/04/18 1005 11/04/18 1805 11/05/18 0405  BP: (!) 95/55 111/71 138/79 136/77  Pulse: 66 66 70 75  Resp: 16 18 18 20   Temp: 97.9 F (36.6 C) 98.4 F (36.9 C) 98.1 F (36.7 C) 98.8 F (37.1 C)  TempSrc: Oral Oral Oral Oral  SpO2: 95% 100%  97%  Weight:   116 kg   Height:       Constitution: NAD, supine in bed Cardio: RRR, III/IV RUSB murmur, +JVD Respiratory: +faint crackles bases b/l Abdominal: soft, non-distended, +BS, NTTP MSK: RLE with trace edema, otherwise no edema Skin: c/d/i   Assessment/Plan:  Principal Problem:   GI bleed Active Problems:   Symptomatic anemia   Iron deficiency anemia due to chronic blood loss   Chronic heart failure with preserved ejection fraction (HCC)  [name] is a 73 yo male with PMH achalasia, 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 Colonoscopy at this timeshowed 5 AVMs tx with APC, planned repeat 2/2 poor prep.Last EGDduring this admission2/2020 showed non-bleeding angiotectasias in the stomach, duodenum, and jejunum that were treated with argon plasma coagulation as well.  GI Bleed Hemoglobin currently stable. Has received 3U RBCs total. Cardiology consulted for procedural clearance and feel he is stable for procedure with cardiac risk index score of 1 and 6% risk of adverse cardiac event. Recommend repeat ECHO.   - GI consulted - scheduled for EGD/colonoscopy at 9am tomorrow, will f/u findings and  recommendations  - CLD, NPO at midnight  - start Fe supplementation po after colonoscopy  - repeat ECHO - am CBC  - SCDs  Elevated Troponin Likely secondary to stress response from CPR. Remained stable at .06. Discussed feraheme reaction with him, and that he will still be able to take PO iron in the future but that this particular infusion will have to be avoided in the future.   Leukocytosis - resolved  Chronic HFpEF Hypertension AKI - resolved Has faint crackles bilaterally today and some JVD. He is +2.7L up total, likely secondary to blood transfusion as he is urinating. Will give home lasix dose. AKI has resolved.   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 With this being unprovoked he should stay on anticoagulation indefinitely, but due to the increased risk of bleeding with his AVMs and recurrence of bleeding, we will plan to recommend stopping his anticoagulation. Will discuss risks vs. Benefits wit him prior to discharge. He will need to f/u in clinic in four weeks to recheck D-Dimer and will be ongoing evaluation.   VTE: SCDs IVF: none Diet: CLD Code: full   Dispo: Anticipated discharge pending EGD and colonoscopy.   Molli Hazard A, DO 11/05/2018, 6:18 AM Pager: [phone]

## 2018-11-05 NOTE — Anesthesia Preprocedure Evaluation (Addendum)
Anesthesia Evaluation  Patient identified by MRN, date of birth, ID band Patient awake    Reviewed: Allergy & Precautions, H&P , NPO status , Patient's Chart, lab work & pertinent test results  History of Anesthesia Complications Negative for: history of anesthetic complications  Airway Mallampati: II  TM Distance: >3 FB Neck ROM: Full    Dental  (+) Dental Advisory Given, Edentulous Upper   Pulmonary former smoker,    breath sounds clear to auscultation       Cardiovascular hypertension, Pt. on medications + DVT  + Valvular Problems/Murmurs AS  Rhythm:Regular Rate:Normal + Systolic murmurs  '20 TTE - EF 60-65%. There is severely increased left ventricular wall thickness. LA was mildly dilated. Mild AI, moderate AS.     Neuro/Psych negative neurological ROS  negative psych ROS   GI/Hepatic Neg liver ROS, GERD  Medicated and Controlled, achalasia   Endo/Other   Obesity  Renal/GU negative Renal ROS  negative genitourinary   Musculoskeletal  (+) Arthritis ,   Abdominal   Peds negative pediatric ROS (+)  Hematology  (+) anemia ,   Anesthesia Other Findings   Reproductive/Obstetrics negative OB ROS                            Anesthesia Physical  Anesthesia Plan  ASA: III  Anesthesia Plan: MAC   Post-op Pain Management:    Induction: Intravenous  PONV Risk Score and Plan: 1 and Propofol infusion and Treatment may vary due to age or medical condition  Airway Management Planned: Nasal Cannula and Natural Airway  Additional Equipment: None  Intra-op Plan:   Post-operative Plan:   Informed Consent: I have reviewed the patients History and Physical, chart, labs and discussed the procedure including the risks, benefits and alternatives for the proposed anesthesia with the patient or authorized representative who has indicated his/her understanding and acceptance.       Plan  Discussed with: CRNA and Anesthesiologist  Anesthesia Plan Comments:        Anesthesia Quick Evaluation

## 2018-11-05 NOTE — H&P (View-Only) (Signed)
Patient ID: [name], male   DOB: [birth year], 73 y.o.   MRN: [id number]    Progress Note   Subjective  Feels fine, taking clear liquids - says stool is pure water today  2decho - done -pending hgb 8.2 stable   Objective   Vital signs in last 24 hours: Temp:  [98.1 F (36.7 C)-98.8 F (37.1 C)] 98.8 F (37.1 C) (05/12 0405) Pulse Rate:  [66-75] 75 (05/12 0405) Resp:  [18-20] 20 (05/12 0405) BP: (111-138)/(71-79) 136/77 (05/12 0405) SpO2:  [97 %-100 %] 97 % (05/12 0405) Weight:  [382 kg] 116 kg (05/11 1805) Last BM Date: 11/04/18 General:  elderly  AA male in NAD Heart:  Regular rate and rhythm; no murmurs Lungs: Respirations even and unlabored, lungs CTA bilaterally Abdomen:  Soft, obese,nontender and nondistended. Normal bowel sounds. Extremities:  Without edema. Neurologic:  Alert and oriented,  grossly normal neurologically. Psych:  Cooperative. Normal mood and affect.  Intake/Output from previous day: 05/11 0701 - 05/12 0700 In: 2160 [P.O.:2160] Out: 651 [Urine:650; Stool:1] Intake/Output this shift: No intake/output data recorded.  Lab Results: Recent Labs    11/03/18 1948 11/04/18 0646 11/05/18 0331  WBC 15.2* 11.9* 9.8  HGB 7.2* 7.3* 8.2*  HCT 23.9* 24.8* 27.8*  PLT 303 297 339   BMET Recent Labs    11/03/18 0706 11/04/18 0646 11/05/18 0331  NA 137 137 141  K 4.3 3.6 3.9  CL 100 104 103  CO2 25 26 25   GLUCOSE 100* 84 90  BUN 16 13 9   CREATININE 1.36* 1.27* 1.23  CALCIUM 9.1 9.3 9.5   LFT Recent Labs    11/05/18 0331  PROT 6.1*  ALBUMIN 3.0*  AST 21  ALT 18  ALKPHOS 41  BILITOT 1.0   PT/INR No results for input(s): LABPROT, INR in the last 72 hours.    Assessment / Plan:    #1 73 yo AA male admitted with progressive anemia,  Iron deficiency, weakness in setting of known gastric , small bowel and Colonic AVM's  In setting of recent Xarelto  For DVT  Pt had adverse rxn to [facility] with episode of unresponsiveness, felt  pulseless on 5/9 and required brief Code with bagging /chest compressions Prep delayed  Due to above   Pt has been evaluated by Cardiology 5/11 to clear prior  to sedation for procedures. Per cardiology pt has a cardiac risk index  Score of 1 with a 6% risk of adverse cardiac event in perioperative setting Elevated troponin felt secondary to demand ischemia  #2 anemia - acute on chronic - stable post transfusions, and fereheme #3 mild-mod- AS  #4 Acute DVT dec 2019 - per medicine service - they do not plan on resuming Xarelto on d/c- plan is for d-dimer in 4 weeks   Plan; Scheduled for EGD/enteroscopy and Colon  tomorrow with Dr. Rush Landmark  Clears today  Transfuse for hgb 7 or less Second Moviprep this evening - pt reluctant but will try .         Principal Problem:   GI bleed Active Problems:   Symptomatic anemia   Iron deficiency anemia due to chronic blood loss   Chronic heart failure with preserved ejection fraction (HCC)     LOS: 4 days   [name]  11/05/2018, 9:28 AM

## 2018-11-05 NOTE — Progress Notes (Signed)
  Echocardiogram 2D Echocardiogram has been performed.  [name] 11/05/2018, 9:14 AM

## 2018-11-05 NOTE — Progress Notes (Signed)
Physical Therapy Treatment Patient Details Name: JADA KUHNERT MRN: [id number] DOB: February 09, 1946 Today's Date: 11/05/2018    History of Present Illness 73 year old male with an extensive past medical history including achalasia diverticulosis, multiple gastric small bowel and colonic AVMs, unprovoked DVT on Xarelto, history of B12 deficiency and heart failure preserved ejection fraction.  He was sent to the emergency department for evaluation of progressive anemia. Coded 11-11-2022, CPR for 2 minutes, recovered; considering colonoscopy for 5/11    PT Comments    Continuing work on functional mobility and activity tolerance;  Noting modest increases in gait distance and activity tolerance; Pt aware plan is for colonoscopy tomorrow  Follow Up Recommendations  Home health PT;Supervision/Assistance - 24 hour     Equipment Recommendations  3in1 (PT)(per OTno need for 3in1 -- will defer to them)    Recommendations for Other Services       Precautions / Restrictions Precautions Precautions: Fall    Mobility  Bed Mobility Overal bed mobility: Needs Assistance Bed Mobility: Supine to Sit     Supine to sit: Mod assist     General bed mobility comments: Light mod handheld assist to pull to sit; cues to fully clear feet from EOB  Transfers Overall transfer level: Needs assistance Equipment used: Rolling walker (2 wheeled) Transfers: Sit to/from Stand Sit to Stand: Min guard         General transfer comment: Minguard assist for safety; slow moving, and dependent on UEs ppushing on RW,  but not needing phsyical assist to rise  Ambulation/Gait Ambulation/Gait assistance: Min guard Gait Distance (Feet): 80 Feet Assistive device: Rolling walker (2 wheeled) Gait Pattern/deviations: Step-through pattern;Decreased step length - right;Decreased step length - left;Decreased stride length;Trunk flexed Gait velocity: slow   This therapist pulled chair behind just in case due to low Hgb    General Gait Details: Cues to self-monitor for activity tolerance; noted tired at end of walk with incr trunk flexion   Stairs             Wheelchair Mobility    Modified Rankin (Stroke Patients Only)       Balance                                            Cognition Arousal/Alertness: Awake/alert Behavior During Therapy: WFL for tasks assessed/performed Overall Cognitive Status: Within Functional Limits for tasks assessed                                 General Comments: WFL for simple mobility tasks; Did not remenber/understand what happened with code a few days ago      Exercises      General Comments        Pertinent Vitals/Pain Pain Assessment: No/denies pain    Home Living                      Prior Function            PT Goals (current goals can now be found in the care plan section) Acute Rehab PT Goals Patient Stated Goal: Did not state, but agreeable to amb PT Goal Formulation: With patient Time For Goal Achievement: 11/17/18 Potential to Achieve Goals: Good Progress towards PT goals: Progressing toward goals    Frequency    Min  3X/week      PT Plan Current plan remains appropriate    Co-evaluation              AM-PAC PT "6 Clicks" Mobility   Outcome Measure  Help needed turning from your back to your side while in a flat bed without using bedrails?: A Little Help needed moving from lying on your back to sitting on the side of a flat bed without using bedrails?: A Little Help needed moving to and from a bed to a chair (including a wheelchair)?: A Little Help needed standing up from a chair using your arms (e.g., wheelchair or bedside chair)?: A Little Help needed to walk in hospital room?: A Little Help needed climbing 3-5 steps with a railing? : A Lot 6 Click Score: 17    End of Session Equipment Utilized During Treatment: Gait belt Activity Tolerance: Patient tolerated  treatment well Patient left: in chair;with call bell/phone within reach;with chair alarm set Nurse Communication: Mobility status PT Visit Diagnosis: Unsteadiness on feet (R26.81);Other abnormalities of gait and mobility (R26.89)     Time: 4944-9675 PT Time Calculation (min) (ACUTE ONLY): 18 min  Charges:  $Gait Training: 8-22 mins                     Roney Marion, [place]  Acute Rehabilitation Services Pager [phone] Office [place] 11/05/2018, 10:59 AM

## 2018-11-05 NOTE — Progress Notes (Signed)
  Date: 11/05/2018  Patient name: [name]  Medical record number: [id number]  Date of birth: [birth year]   I have seen and evaluated this patient and I have discussed the plan of care with the house staff. Please see their note for complete details. I concur with their findings with the following additions/corrections: Follow in-depth chart review, a discharge summary at the end of February indicated that "It seems that Mr. Baby did not complete his initial Xarelto therapy 2/2 DVT so he was restarted on xarelto 15 mg bid for 21 days."  So essentially the clock was restarted on DVT treatment at the end of February.  However, this does not change the risk-benefit ratio leaning towards the risk being greater than the benefit of continuing anticoagulation.  We will recommend that he not resume his anticoagulation at discharge.  Bartholomew Crews, MD 11/05/2018, 12:26 PM

## 2018-11-05 NOTE — Progress Notes (Signed)
Patient ID: [name], male   DOB: 13-Jan-1946, 73 y.o.   MRN: [id number]    Progress Note   Subjective  Feels fine, taking clear liquids - says stool is pure water today  2decho - done -pending hgb 8.2 stable   Objective   Vital signs in last 24 hours: Temp:  [98.1 F (36.7 C)-98.8 F (37.1 C)] 98.8 F (37.1 C) (05/12 0405) Pulse Rate:  [66-75] 75 (05/12 0405) Resp:  [18-20] 20 (05/12 0405) BP: (111-138)/(71-79) 136/77 (05/12 0405) SpO2:  [97 %-100 %] 97 % (05/12 0405) Weight:  [662 kg] 116 kg (05/11 1805) Last BM Date: 11/04/18 General:  elderly  AA male in NAD Heart:  Regular rate and rhythm; no murmurs Lungs: Respirations even and unlabored, lungs CTA bilaterally Abdomen:  Soft, obese,nontender and nondistended. Normal bowel sounds. Extremities:  Without edema. Neurologic:  Alert and oriented,  grossly normal neurologically. Psych:  Cooperative. Normal mood and affect.  Intake/Output from previous day: 05/11 0701 - 05/12 0700 In: 2160 [P.O.:2160] Out: 651 [Urine:650; Stool:1] Intake/Output this shift: No intake/output data recorded.  Lab Results: Recent Labs    11/03/18 1948 11/04/18 0646 11/05/18 0331  WBC 15.2* 11.9* 9.8  HGB 7.2* 7.3* 8.2*  HCT 23.9* 24.8* 27.8*  PLT 303 297 339   BMET Recent Labs    11/03/18 0706 11/04/18 0646 11/05/18 0331  NA 137 137 141  K 4.3 3.6 3.9  CL 100 104 103  CO2 25 26 25   GLUCOSE 100* 84 90  BUN 16 13 9   CREATININE 1.36* 1.27* 1.23  CALCIUM 9.1 9.3 9.5   LFT Recent Labs    11/05/18 0331  PROT 6.1*  ALBUMIN 3.0*  AST 21  ALT 18  ALKPHOS 41  BILITOT 1.0   PT/INR No results for input(s): LABPROT, INR in the last 72 hours.    Assessment / Plan:    #1 73 yo AA male admitted with progressive anemia,  Iron deficiency, weakness in setting of known gastric , small bowel and Colonic AVM's  In setting of recent Xarelto  For DVT  Pt had adverse rxn to [facility] with episode of unresponsiveness, felt  pulseless on 5/9 and required brief Code with bagging /chest compressions Prep delayed  Due to above   Pt has been evaluated by Cardiology 5/11 to clear prior  to sedation for procedures. Per cardiology pt has a cardiac risk index  Score of 1 with a 6% risk of adverse cardiac event in perioperative setting Elevated troponin felt secondary to demand ischemia  #2 anemia - acute on chronic - stable post transfusions, and fereheme #3 mild-mod- AS  #4 Acute DVT dec 2019 - per medicine service - they do not plan on resuming Xarelto on d/c- plan is for d-dimer in 4 weeks   Plan; Scheduled for EGD/enteroscopy and Colon  tomorrow with Dr. Rush Landmark  Clears today  Transfuse for hgb 7 or less Second Moviprep this evening - pt reluctant but will try .         Principal Problem:   GI bleed Active Problems:   Symptomatic anemia   Iron deficiency anemia due to chronic blood loss   Chronic heart failure with preserved ejection fraction (HCC)     LOS: 4 days   Ina Scrivens  11/05/2018, 9:28 AM

## 2018-11-06 ENCOUNTER — Encounter (HOSPITAL_COMMUNITY)
Admission: EM | Disposition: A | Discharge status: Home Health | Payer: Self-pay | Source: Home / Self Care | Attending: Internal Medicine

## 2018-11-06 ENCOUNTER — Inpatient Hospital Stay (HOSPITAL_COMMUNITY): Payer: Medicare Other | Admitting: Anesthesiology

## 2018-11-06 ENCOUNTER — Encounter (HOSPITAL_COMMUNITY): Payer: Self-pay | Admitting: *Deleted

## 2018-11-06 DIAGNOSIS — K644 Residual hemorrhoidal skin tags: Secondary | ICD-10-CM

## 2018-11-06 DIAGNOSIS — K648 Other hemorrhoids: Secondary | ICD-10-CM

## 2018-11-06 DIAGNOSIS — K319 Disease of stomach and duodenum, unspecified: Secondary | ICD-10-CM

## 2018-11-06 DIAGNOSIS — K5730 Diverticulosis of large intestine without perforation or abscess without bleeding: Secondary | ICD-10-CM

## 2018-11-06 HISTORY — PX: HOT HEMOSTASIS: SHX5433

## 2018-11-06 HISTORY — PX: HEMOSTASIS CLIP PLACEMENT: SHX6857

## 2018-11-06 HISTORY — PX: COLONOSCOPY WITH PROPOFOL: SHX5780

## 2018-11-06 HISTORY — PX: BIOPSY: SHX5522

## 2018-11-06 HISTORY — PX: ENTEROSCOPY: SHX5533

## 2018-11-06 LAB — BASIC METABOLIC PANEL
Anion gap: 14 (ref 5–15)
BUN: 5 mg/dL — ABNORMAL LOW (ref 8–23)
CO2: 27 mmol/L (ref 22–32)
Calcium: 9.5 mg/dL (ref 8.9–10.3)
Chloride: 102 mmol/L (ref 98–111)
Creatinine, Ser: 1.18 mg/dL (ref 0.61–1.24)
GFR calc Af Amer: >60 mL/min (ref >60)
GFR calc non Af Amer: >60 mL/min (ref >60)
Glucose, Bld: 87 mg/dL (ref 70–99)
Potassium: 3.7 mmol/L (ref 3.5–5.1)
Sodium: 143 mmol/L (ref 135–145)

## 2018-11-06 LAB — CBC
HCT: 29.6 % — ABNORMAL LOW (ref 39.0–52.0)
Hemoglobin: 8.7 g/dL — ABNORMAL LOW (ref 13.0–17.0)
MCH: 22.1 pg — ABNORMAL LOW (ref 26.0–34.0)
MCHC: 29.4 g/dL — ABNORMAL LOW (ref 30.0–36.0)
MCV: 75.3 fL — ABNORMAL LOW (ref 80.0–100.0)
Platelets: 332 10*3/uL (ref 150–400)
RBC: 3.93 MIL/uL — ABNORMAL LOW (ref 4.22–5.81)
RDW: 25.5 % — ABNORMAL HIGH (ref 11.5–15.5)
WBC: 8.7 10*3/uL (ref 4.0–10.5)
nRBC: 0 % (ref 0.0–0.2)

## 2018-11-06 LAB — MAGNESIUM: Magnesium: 1.8 mg/dL (ref 1.7–2.4)

## 2018-11-06 SURGERY — ESOPHAGOGASTRODUODENOSCOPY (EGD) WITH PROPOFOL
Anesthesia: Monitor Anesthesia Care

## 2018-11-06 SURGERY — COLONOSCOPY WITH PROPOFOL
Anesthesia: Monitor Anesthesia Care

## 2018-11-06 MED ORDER — PROPOFOL 500 MG/50ML IV EMUL
INTRAVENOUS | Status: DC | PRN
  Administered 2018-11-06 (×2): 75 ug/kg/min via INTRAVENOUS

## 2018-11-06 MED ORDER — SPOT INK MARKER SYRINGE KIT
PACK | SUBMUCOSAL | Status: DC | PRN
  Administered 2018-11-06: 1 mL via SUBMUCOSAL

## 2018-11-06 MED ORDER — LIDOCAINE HCL (CARDIAC) PF 100 MG/5ML IV SOSY
PREFILLED_SYRINGE | INTRAVENOUS | Status: DC | PRN
  Administered 2018-11-06: 100 mg via INTRAVENOUS

## 2018-11-06 MED ORDER — PROPOFOL 10 MG/ML IV BOLUS
INTRAVENOUS | Status: DC | PRN
  Administered 2018-11-06: 20 mg via INTRAVENOUS
  Administered 2018-11-06: 30 mg via INTRAVENOUS
  Administered 2018-11-06 (×2): 20 mg via INTRAVENOUS

## 2018-11-06 MED ORDER — PHENYLEPHRINE HCL (PRESSORS) 10 MG/ML IV SOLN
INTRAVENOUS | Status: DC | PRN
  Administered 2018-11-06: 80 ug via INTRAVENOUS
  Administered 2018-11-06: 120 ug via INTRAVENOUS
  Administered 2018-11-06: 80 ug via INTRAVENOUS
  Administered 2018-11-06: 120 ug via INTRAVENOUS
  Administered 2018-11-06: 80 ug via INTRAVENOUS

## 2018-11-06 MED ORDER — FUROSEMIDE 10 MG/ML IJ SOLN
20.0000 mg | Freq: Once | INTRAMUSCULAR | Status: AC
  Administered 2018-11-06: 20 mg via INTRAVENOUS
  Filled 2018-11-06: qty 2

## 2018-11-06 MED ORDER — SPOT INK MARKER SYRINGE KIT
PACK | SUBMUCOSAL | Status: AC
  Filled 2018-11-06: qty 5

## 2018-11-06 MED ORDER — LACTATED RINGERS IV SOLN
INTRAVENOUS | Status: DC
  Administered 2018-11-06: 08:00:00 via INTRAVENOUS

## 2018-11-06 SURGICAL SUPPLY — 22 items

## 2018-11-06 NOTE — Progress Notes (Signed)
  Date: 11/06/2018  Patient name: [name]  Medical record number: [id number]  Date of birth: [birth year]   I have seen and evaluated this patient and I have discussed the plan of care with the house staff. Please see their note for complete details. I concur with their findings with the following additions/corrections: Mr. Hubbert was seen this morning on team rounds.  He was just waking up and subsequent to our visit, he was taken to the endoscopy suite.  His EGD showed nonbleeding erosive gastropathy, for nonbleeding AVMs in the jejunum which were treated with APC.  His colonoscopy showed 5 colonic AVMs, diverticulosis, and nonbleeding nonthrombosed external and internal hemorrhoids.  Hemoglobin has been stable.  He will need to start iron supplementation orally after discharge.  We do not intend to discharge him on anticoagulation after consideration of the risk-benefit ratio.  Bartholomew Crews, MD 11/06/2018, 3:14 PM

## 2018-11-06 NOTE — Transfer of Care (Signed)
Immediate Anesthesia Transfer of Care Note  Patient: FLETCHER OSTERMILLER  Procedure(s) Performed: COLONOSCOPY WITH PROPOFOL (N/A ) ENTEROSCOPY (N/A ) HOT HEMOSTASIS (ARGON PLASMA COAGULATION/BICAP) (N/A ) BIOPSY  Patient Location: Endoscopy Unit  Anesthesia Type:MAC  Level of Consciousness: drowsy  Airway & Oxygen Therapy: Patient Spontanous Breathing and Patient connected to nasal cannula oxygen  Post-op Assessment: Report given to RN, Post -op Vital signs reviewed and stable and Patient moving all extremities  Post vital signs: Reviewed and stable  Last Vitals:  Vitals Value Taken Time  BP 117/64 11/06/2018  9:54 AM  Temp 36.4 C 11/06/2018  9:54 AM  Pulse 67 11/06/2018  9:55 AM  Resp 23 11/06/2018  9:55 AM  SpO2 99 % 11/06/2018  9:55 AM  Vitals shown include unvalidated device data.  Last Pain:  Vitals:   11/06/18 0954  TempSrc: Oral  PainSc: 0-No pain         Complications: No apparent anesthesia complications

## 2018-11-06 NOTE — Interval H&P Note (Signed)
History and Physical Interval Note:  11/06/2018 7:54 AM  [name]  has presented today for surgery, with the diagnosis of GI bleeding, anemia.  The various methods of treatment have been discussed with the patient and family. After consideration of risks, benefits and other options for treatment, the patient has consented to  Procedure(s): COLONOSCOPY WITH PROPOFOL (N/A) ENTEROSCOPY (N/A) as a surgical intervention.  The patient's history has been reviewed, patient examined, no change in status, stable for surgery.  I have reviewed the patient's chart and labs.  Questions were answered to the patient's satisfaction.     [employer]

## 2018-11-06 NOTE — Progress Notes (Signed)
   Subjective: His breathing feels the same as yesterday. Denies changes in urination. He has a nonproductive cough. Asking about breakfast but informed him that he has a procedure this morning and can't eat anything until afterwards.   Objective:  Vital signs in last 24 hours: Vitals:   11/05/18 0933 11/05/18 1816 11/05/18 2026 11/06/18 0546  BP: 139/90 (!) 142/66 (!) 120/58 132/69  Pulse: 75 67 69 71  Resp: 18 18 (!) 21 18  Temp: 98.4 F (36.9 C) 98.6 F (37 C) 98.8 F (37.1 C) 98.9 F (37.2 C)  TempSrc: Oral Oral Oral Oral  SpO2: 99% 93% 99% 98%  Weight:   116.2 kg   Height:       Constitution: NAD, supine in bed Cardio: RRR, III/IV RUSB murmur, no JVD Respiratory: +crackles bases b/l Abdominal: +BS, NTTP, non-distended, soft MSK: no LE edema, moving all extremities Skin: c/d/i    Assessment/Plan:  Principal Problem:   GI bleed Active Problems:   Symptomatic anemia   Iron deficiency anemia due to chronic blood loss   Chronic heart failure with preserved ejection fraction (HCC)  [name] is a 73 yo male with PMH achalasia, 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 Colonoscopy at this timeshowed 5 AVMs tx with APC, planned repeat 2/2 poor prep.Last EGDduring this admission2/2020 showed non-bleeding angiotectasias in the stomach, duodenum, and jejunum that were treated with APC as well.  GI Bleed Colonoscopy and EGD this am. Will follow-up recommendations. Hemoglobin has been stable.   - f/u colonoscopy and EGD  - will f/u GI recommendations  - advance diet after EGD  - start Fe supplementation after colonoscopy/EGD - continue SCDs - am CBC   Chronic HFpEF Hypertension Continues to have some crackles on exam.   - cont. 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 With this being unprovoked he should stay on anticoagulation indefinitely, but due to the increased risk of bleeding with his AVMs and recurrence of bleeding, we will plan to recommend stopping his anticoagulation. Will discuss risks vs. Benefits wit him prior to discharge. He will need to f/u in clinic in four weeks to recheck D-Dimer and will be ongoing evaluation.   VTE: SCDs IVF: none Diet: NPO --> will advance diet after procedure Code: full   Dispo: Anticipated discharge pending colonoscopy and EGD findings.   Marty Heck, DO 11/06/2018, 6:32 AM Pager: [phone]

## 2018-11-06 NOTE — Op Note (Signed)
[facility] Patient Name: [name] Procedure Date : 11/06/2018 MRN: [id number] Attending MD: Justice Britain , MD Date of Birth: 1946/01/31 CSN: [id number] Age: 73 Admit Type: Inpatient Procedure:                Small bowel enteroscopy Indications:              Iron deficiency anemia, Suspected angioectasia,                            Angioectasia Providers:                Justice Britain, MD, Glori Bickers, RN, Marguerita Merles, Technician, Charolette Child, Technician,                            Raphael Gibney, CRNA Referring MD:              Medicines:                Monitored Anesthesia Care Complications:            No immediate complications. Estimated Blood Loss:     Estimated blood loss was minimal. Procedure:                Pre-Anesthesia Assessment:                           - Prior to the procedure, a History and Physical                            was performed, and patient medications and                            allergies were reviewed. The patient's tolerance of                            previous anesthesia was also reviewed. The risks                            and benefits of the procedure and the sedation                            options and risks were discussed with the patient.                            All questions were answered, and informed consent                            was obtained. Prior Anticoagulants: The patient has                            taken Xarelto (rivaroxaban), last dose was 4 days                            prior to procedure.  ASA Grade Assessment: III - A                            patient with severe systemic disease. After                            reviewing the risks and benefits, the patient was                            deemed in satisfactory condition to undergo the                            procedure.                           After obtaining informed consent, the endoscope  was                            passed under direct vision. Throughout the                            procedure, the patient's blood pressure, pulse, and                            oxygen saturations were monitored continuously. The                            [id number]-H190DL ([id number]) Olympus pediatric colonoscope                            was introduced through the mouth and advanced to                            the proximal jejunum. The small bowel enteroscopy                            was accomplished without difficulty. The patient                            tolerated the procedure. Scope In: Scope Out: Findings:      No gross lesions were noted in the entire esophagus.      A small hiatal hernia was present.      Multiple dispersed, small non-bleeding erosions were found in the       gastric body and in the gastric antrum.      No other gross mucosal lesions were noted in the entire examined       stomach. This was biopsied with a cold forceps for histology and       Helicobacter pylori testing.      Normal mucosa was found in the entire duodenum.      Four angioectasias with no bleeding were found in the proximal jejunum.       Fulguration to ablate the lesion to prevent bleeding by argon plasma was       successful using small bowel settings.      A previously placed tattoo  was seen in the proximal jejunum.      Normal mucosa was found in the proximal jejunum otherwise. To demarcate       a further extent than last SBE, an area was tattooed with an injection       of Spot (carbon black). Impression:               - No gross lesions in esophagus. Small hiatal                            hernia.                           - Non-bleeding erosive gastropathy. Biopsied for HP.                           - Normal mucosa was found in the entire examined                            duodenum.                           - Four non-bleeding angioectasias in the jejunum.                             Treated with argon plasma coagulation (APC).                           - A previous tattoo was seen in the proximal                            jejunum.                           - Normal mucosa was found in the jejunum otherwise                            that was visualized. Tattooed distal extent                            (further from last SBE). Recommendation:           - Proceed to scheduled colonoscopy.                           - Await pathology results.                           - Continue PPI 40 mg BID                           - Further discussion about anticoagulation on                            colonoscopy report.                           - The findings and recommendations were discussed  with the patient.                           - The findings and recommendations were discussed                            with the referring physician. Procedure Code(s):        --- Professional ---                           [phone], Small intestinal endoscopy, enteroscopy                            beyond second portion of duodenum, not including                            ileum; with biopsy, single or multiple                           44799, Unlisted procedure, small intestine Diagnosis Code(s):        --- Professional ---                           K44.9, Diaphragmatic hernia without obstruction or                            gangrene                           K31.89, Other diseases of stomach and duodenum                           K55.20, Angiodysplasia of colon without hemorrhage                           D50.9, Iron deficiency anemia, unspecified CPT copyright 2019 [employer]. All rights reserved. The codes documented in this report are preliminary and upon coder review may  be revised to meet current compliance requirements. Justice Britain, MD 11/06/2018 10:10:15 AM Number of Addenda: 0

## 2018-11-06 NOTE — Progress Notes (Signed)
Pt took second dose of movi prep last night with good results but still brown liquid not clear, last liquid stool at 0530 this AM will continue to monitor

## 2018-11-06 NOTE — Care Management Important Message (Signed)
Important Message  Patient Details  Name: [name] MRN: [id number] Date of Birth: [birth year]   Medicare Important Message Given:  Yes    Orbie Pyo 11/06/2018, 2:07 PM

## 2018-11-06 NOTE — Discharge Summary (Signed)
Name: [name] MRN: [id number] DOB: July 23, 1945 73 y.o. PCP: Shawn Groves, DO  Date of Admission: 11/01/2018 12:41 PM Date of Discharge: 11/07/2018 Attending Physician: Shawn Dresser, MD  Discharge Diagnosis: 1. Gastrointestinal Bleed 2. Acute on Chronic Iron Deficiency Anemia  3. Unprovoked RLE DVT  4. Chronic Diastolic Heart Failure  Discharge Medications: Allergies as of 11/07/2018      Reactions   Feraheme [ferumoxytol]    Found unresponsive after starting infusion   Amlodipine Swelling, Other (See Comments)   Bilateral lower extremity swelling      Medication List    STOP taking these medications   rivaroxaban 20 MG Tabs tablet Commonly known as:  XARELTO     TAKE these medications   Combigan 0.2-0.5 % ophthalmic solution Generic drug:  brimonidine-timolol Place 1 drop into both eyes 2 (two) times daily.   furosemide 40 MG tablet Commonly known as:  LASIX Take 1 tablet (40 mg total) by mouth daily.   gabapentin 300 MG capsule Commonly known as:  NEURONTIN Take 2 capsules (600 mg total) by mouth 3 (three) times daily. What changed:  when to take this   hydrocortisone cream 1 % Apply to itchy area 2 times daily What changed:    how much to take  how to take this  when to take this  additional instructions   Iron 325 (65 Fe) MG Tabs Take 1 tablet (325 mg total) by mouth daily.   oxyCODONE-acetaminophen 10-325 MG tablet Commonly known as:  PERCOCET Take 1 tablet by mouth every 8 (eight) hours as needed for pain.   pantoprazole 40 MG tablet Commonly known as:  PROTONIX Take 1 tablet (40 mg total) by mouth 2 (two) times daily.   sorbitol 70 % solution Take 15 mLs by mouth daily as needed. What changed:  reasons to take this   verapamil 180 MG CR tablet Commonly known as:  CALAN-SR Take 1 tablet (180 mg total) by mouth daily.   vitamin B-12 1000 MCG tablet Commonly known as:  CYANOCOBALAMIN Take 1 tablet (1,000 mcg total) by  mouth daily.       Disposition and follow-up:   [name] was discharged from [facility] in Stable condition.  At the hospital follow up visit please address:  1. Gastrointestinal Bleed: Hx of AVMs, admitted for recurrence of GI bleed with Hgb 6.2, Xarelto held and transfused 3U total over several days with stabilization of hemoglobin prior to EGD and colonoscopy. Poor prep even over 4 days CLD. Multiple AVMs on EGD and Colonoscopy requiring APC, which he has a history of on prior admission in 07/2018 as well. Discharged with discontinuation of xarelto. Protonix 40 mg bid continued at discharge.   2. Acute on Chronic Iron Deficiency Anemia: Loss of Consciousness attributed to IV iron infusion reaction, code called w/PEA arrest, although unclear if actual code. Do not give IV iron. Discharged with continuation of Iron supplement po.  3. Unprovoked RLE DVT: Unprovoked RLE DVT dx 05/2018. Xarelto held at discharge due to high risk for recurrence of bleed. Will need D-dimer four weeks after discharge. Educated on signs of recurrent DVT.  4. Chronic Diastolic Heart Failure: Lasix held at admission 2/2 gi bleed and softer blood pressures. Required some extra diuresis prior to d/c. Euvolemic at d/c w/continuation of home lasix.   2.  Labs / imaging needed at time of follow-up: CBC, d-dimer 4 weeks, BMP   3.  Pending labs/ test needing follow-up: none  Follow-up Appointments: Follow-up Information    [facility] Follow up.   Why:  You have an in-person hospital follow up appointment on May 21st at [place] information: [address] [place] Follow up.   Why:  skilled nursing, physical therapy, occupational therapy          Hospital Course by problem list: 1. Gastrointestinal Bleed 2. Acute on Chronic Iron Deficiency Anemia   [name]. [name] is a 73 yo male  with PMH achalasia, colonic, gastric, small intestine AVMs with GI bleed 07/2018, diverticulosis, RLE unprovoked DVT on Xarelto, HTN, HFpEF, and mild aortic stenosis who presented to [facility] from [facility] for Hgb of 7, found to be 6.2 in [facility] ED. No frank blood and patient was asymptomatic except for chronic dizziness and dyspnea on exertion since previous GI bleed in February. FOBT positive. He was admitted to the IMTS service. GI was consulted and he was placed on a CLD with plan for prolonged 2 days prep due to previous prep in February being poor with plan for EGD as well.  He has a history of unprovoked RLE DVT diagnosed last December 2019 and has been on Xarelto since this. This was held at admission and SCDs were placed. Over several days he trended slowly down and required 3U RBC transfusion.  Patient also had iron deficiency anemia and was taking supplemental iron at home. IV iron transfusion with feraheme was started. Shortly after starting patient had episode of LOC and Code Blue was called for PEA arrest. Two minutes of CPR were performed and symptoms resolved completely after IV iron infusion was stopped. Unclear if he actually coded but symptoms were attributed to infusion, and he should not have an iron infusion, especially feraheme, in the future. Patient had normal mentation after code but some chest tenderness from CPR.  Prep was continued for two more days after code was called to assure patient stability. Prior to procedure cardiology was consulted for procedural clearance. Echo was done which showed normal EF, severe LVH, mild aortic regurge and moderate aortic stenosis with clearance given for procedure.  On 5/14 EGD and colonoscopy were done, results are noted below. He had no bleeding seen. Of note, colonoscopy had poor prep despite four days of CLD with one FLD meal. He was discharged with 40 mg protonix bid, iron supplementation and will f/u with the IMTS clinic and with GI in the  outpatient setting. Home health PT and skilled nursing ordered to help patient at home and for medication management.   3. Unprovoked RLE DVT  With his multiple AVMs and recurrent GI bleeds, risks vs. Benefit were discussed with patient and it was felt best to stopped Xarelto, which he ideally would have been on for life 2/2 unprovoked DVT. Mr. Teaster understood and agreed with plan and was educated on symptoms of DVT and PE. He will need D-Dimer four weeks after discharge.   4. Chronic Diastolic Heart Failure Lasix were held at admission for soft blood pressure and in the setting of GI bleed. He did develop some mild LE edema and crackles. He was diuresed with double his home dose and was euvolemic at discharge. He was discharged with continuation of his lasix 20 mg qd.   Discharge Vitals:   BP (!) 118/57 (BP Location: Left Arm)    Pulse 73    Temp 98.2 F (36.8 C) (  Oral)    Resp 18    Ht 6\' 2"  (1.88 m)    Wt 116 kg    SpO2 99%    BMI 32.83 kg/m   Pertinent Labs, Studies, and Procedures:   Stomach Biopsy 11/07/18 Negative H. Pylori Chronic mild Gastritis Intestinal antral mucosal Metaplasia without dysplasia  Colonoscopy 11/07/18  Two nonbleeding cecal AVM's: APC and clipped Three nonbleeding ascending colon AVMs with APC and clipped multiple diverticuli  EGD 11/07/18 Non-bleeding gastric erosions  Four proximal jejunal nonbleeding AVMs with APC  ECHO 5/12 1. The left ventricle has normal systolic function, with an ejection fraction of 60-65%. The cavity size was normal. There is severely increased left ventricular wall thickness.  2. The right ventricle has normal systolc function. The cavity was normal.  3. Left atrial size was mildly dilated.  4. The tricuspid valve was grossly normal.  5. The aortic valve has an indeterminate number of cusps Severely thickening of the aortic valve Moderate calcification of the aortic valve. Aortic valve regurgitation is mild by color flow  Doppler. Moderate stenosis of the aortic valve.  6. There is mild dilatation of the aortic root measuring 39 mm.  7. Normal LV systolic function; severe LVH; calcified aortic valve with moderate AS by doppler (mean gradeint 21 mmHg) and mild AI; mildly dilated aortic root.  FOBT 5/08: positive   Discharge Instructions: Discharge Instructions    Diet - low sodium heart healthy   Complete by:  As directed    Discharge instructions   Complete by:  As directed    You were hospitalized for Gastrointestinal Bleed. Thank you for allowing [place] to be part of your care.   We arranged for you to follow up at: [place] May 21st at 10 am Please don't hesitate to call if this doesn't work for your schedule.   Please note these changes made to your medications:   Please STOP taking rivaroxaban (Xarelto) 20 MG tablets  Please also avoid aspirin and NSAIDs such as Ibuprofen, naproxen, advil, and others.   Please continue pantoprazole (protonix) 40 MG tablet two times per day, once in the morning and once in the evening   Please continue taking Ferrous Sulfate (IRON) 325 MG tablet once per day.    If you note sudden onset of pain or swelling in your lower extremities please call the office or go to the emergency room. If you have sudden onset shortness of breath please do the same. We are stopping your Xarelto as this increases your risk for bleeding and thins your blood. This medication was used to prevent future blood clots, like the one you had in your right leg. Because we are stopping this medication to prevent bleeding, we will continue close follow-up to make sure you do not have a recurrence of blood clots.   Please call our clinic if you have any questions or concerns, we may be able to help and keep you from a long and expensive emergency room wait. Our clinic and after hours phone number is [phone], the best time to call is Monday through Friday 9 am to 4 pm but there is  always someone available 24/7 if you have an emergency. If you need medication refills please notify your pharmacy one week in advance and they will send [place] a request.   Face-to-face encounter (required for Medicare/Medicaid patients)   Complete by:  As directed    I Laurell Roof Oni Dietzman certify that this patient is under my  care and that I, or a nurse practitioner or physician's assistant working with me, had a face-to-face encounter that meets the physician face-to-face encounter requirements with this patient on 11/07/2018. The encounter with the patient was in whole, or in part for the following medical condition(s) which is the primary reason for home health care (List medical condition): Anemia and chronic heart failure causing dyspnea   The encounter with the patient was in whole, or in part, for the following medical condition, which is the primary reason for home health care:  chronic heart failure and anemia   I certify that, based on my findings, the following services are medically necessary home health services:   Nursing Physical therapy     Reason for Medically Necessary Home Health Services:   Skilled Nursing- Changes in Medication/Medication Management Therapy- Therapeutic Exercises to Increase Strength and Endurance     My clinical findings support the need for the above services:  Shortness of breath with activity   Further, I certify that my clinical findings support that this patient is homebound due to:  Ambulates short distances less than 300 feet   Home Health   Complete by:  As directed    To provide the following care/treatments:  RN   Increase activity slowly   Complete by:  As directed       Signed: Molli Hazard A, DO 11/10/2018, 3:30 PM   Pager: [phone]

## 2018-11-06 NOTE — Progress Notes (Signed)
Occupational Therapy Treatment Patient Details Name: [name] MRN: [id number] DOB: 05/19/1946 Today's Date: 11/06/2018    History of present illness 73 year old male with an extensive past medical history including achalasia diverticulosis, multiple gastric small bowel and colonic AVMs, unprovoked DVT on Xarelto, history of B12 deficiency and heart failure preserved ejection fraction.  He was sent to the emergency department for evaluation of progressive anemia. Coded December 02, 2022, CPR for 2 minutes, recovered; considering colonoscopy for 5/11   OT comments  Pt with improved bed mobility, but needing min assist for sit to stand and ambulation with RW. Attempts to sit before aligned with sitting surface. Performed grooming in standing with min guard assist. Difficulty reaching feet to apply lotion to skin. Pt remained up in chair at end of session for lunch.  Follow Up Recommendations  Home health OT    Equipment Recommendations  None recommended by OT    Recommendations for Other Services      Precautions / Restrictions Precautions Precautions: Fall       Mobility Bed Mobility Overal bed mobility: Modified Independent             General bed mobility comments: HOB up  Transfers Overall transfer level: Needs assistance Equipment used: Rolling walker (2 wheeled) Transfers: Sit to/from Stand Sit to Stand: Min assist         General transfer comment: min assist to rise and weight shift forward    Balance                                           ADL either performed or assessed with clinical judgement   ADL Overall ADL's : Needs assistance/impaired     Grooming: Wash/dry hands;Standing;Min guard           Upper Body Dressing : Set up;Sitting Upper Body Dressing Details (indicate cue type and reason): front opening gown     Toilet Transfer: Minimal assistance;Ambulation;BSC;RW   Toileting- Clothing Manipulation and Hygiene: Minimal  assistance;Sit to/from stand       Functional mobility during ADLs: Minimal assistance;Rolling walker       Vision       Perception     Praxis      Cognition Arousal/Alertness: Awake/alert Behavior During Therapy: WFL for tasks assessed/performed Overall Cognitive Status: Within Functional Limits for tasks assessed                                 General Comments: for tasks assessed        Exercises     Shoulder Instructions       General Comments      Pertinent Vitals/ Pain       Pain Assessment: No/denies pain  Home Living                                          Prior Functioning/Environment              Frequency  Min 2X/week        Progress Toward Goals  OT Goals(current goals can now be found in the care plan section)  Progress towards OT goals: Progressing toward goals  Acute Rehab OT Goals Patient Stated Goal: return home  Plan Discharge plan remains appropriate    Co-evaluation                 AM-PAC OT "6 Clicks" Daily Activity     Outcome Measure   Help from another person eating meals?: None Help from another person taking care of personal grooming?: A Little Help from another person toileting, which includes using toliet, bedpan, or urinal?: A Little Help from another person bathing (including washing, rinsing, drying)?: A Lot Help from another person to put on and taking off regular upper body clothing?: None Help from another person to put on and taking off regular lower body clothing?: A Lot 6 Click Score: 18    End of Session Equipment Utilized During Treatment: Gait belt;Rolling walker  OT Visit Diagnosis: Unsteadiness on feet (R26.81);Muscle weakness (generalized) (M62.81)   Activity Tolerance Patient tolerated treatment well   Patient Left in chair;with call bell/phone within reach   Nurse Communication          Time: 0355-9741 OT Time Calculation (min): 22  min  Charges: OT General Charges $OT Visit: 1 Visit OT Treatments $Self Care/Home Management : 8-22 mins   Malka So 11/06/2018, 12:09 PM Nestor Lewandowsky, OTR/L Acute Rehabilitation Services Pager: [phone] Office: [phone]

## 2018-11-06 NOTE — Op Note (Signed)
[facility] Patient Name: [name] Procedure Date : 11/06/2018 MRN: [id number] Attending MD: Justice Britain , MD Date of Birth: 05/28/1946 CSN: [id number] Age: 73 Admit Type: Inpatient Procedure:                Colonoscopy Indications:              Gastrointestinal occult blood loss, Iron deficiency                            anemia secondary to chronic blood loss, Iron                            deficiency anemia, Angioectasia, Follow-up of                            angioectasia, For therapy of angioectasia Providers:                Justice Britain, MD, Glori Bickers, RN, Marguerita Merles, Technician, Charolette Child, Technician Referring MD:             Bartholomew Crews MD, MD, Estill Cotta. Loletha Carrow, MD Medicines:                Monitored Anesthesia Care Complications:            No immediate complications. Estimated Blood Loss:     Estimated blood loss was minimal. Procedure:                Pre-Anesthesia Assessment:                           - Prior to the procedure, a History and Physical                            was performed, and patient medications and                            allergies were reviewed. The patient's tolerance of                            previous anesthesia was also reviewed. The risks                            and benefits of the procedure and the sedation                            options and risks were discussed with the patient.                            All questions were answered, and informed consent                            was obtained. Prior Anticoagulants: The patient has  taken Xarelto (rivaroxaban), last dose was 4 days                            prior to procedure. ASA Grade Assessment: III - A                            patient with severe systemic disease. After                            reviewing the risks and benefits, the patient was    deemed in satisfactory condition to undergo the                            procedure.                           After obtaining informed consent, the colonoscope                            was passed under direct vision. Throughout the                            procedure, the patient's blood pressure, pulse, and                            oxygen saturations were monitored continuously. The                            CF-HQ190L ([id number]) Olympus colonoscope was                            introduced through the anus and advanced to the the                            cecum, identified by appendiceal orifice and                            ileocecal valve. The colonoscopy was performed                            without difficulty. The patient tolerated the                            procedure. The quality of the bowel preparation was                            adequate. The ileocecal valve, appendiceal orifice,                            and rectum were photographed. Scope In: 9:11:08 AM Scope Out: 9:46:12 AM Scope Withdrawal Time: 0 hours 23 minutes 31 seconds  Total Procedure Duration: 0 hours 35 minutes 4 seconds  Findings:      Hemorrhoids were found on perianal exam.      A small amount of semi-liquid stool  was found in the entire colon,       interfering with visualization. Lavage of the area was performed using       copious amounts, resulting in clearance with adequate visualization.      A previously placed hemoclip was found in the ascending colon.      Two medium-sized angioectasias with typical arborization were found in       the cecum. Fulguration to ablate the lesion to prevent bleeding by argon       plasma was successful. To prevent bleeding post-intervention, two       hemostatic clips were successfully placed (MR conditional) on the larger       angioectasia. There was no bleeding during, or at the end, of the       procedure.      Three small angioectasias with typical  arborization were found in the       ascending colon. Fulguration to ablate the lesion to prevent bleeding by       argon plasma was successful. To prevent bleeding post-intervention,       three hemostatic clips were successfully placed (MR conditional). There       was no bleeding during, or at the end, of the procedure.      Multiple small-mouthed diverticula were found in the hepatic flexure,       ascending colon and cecum.      Otherwise, normal mucosa was found in the entire colon.      Non-bleeding non-thrombosed external and internal hemorrhoids were found       during retroflexion, during perianal exam and during digital exam. The       hemorrhoids were Grade II (internal hemorrhoids that prolapse but reduce       spontaneously). Impression:               - Hemorrhoids found on perianal exam.                           - Stool in the entire examined colon.                           - Previous hemoclip present in the ascending colon.                           - Two colonic angioectasias. Treated with argon                            plasma coagulation (APC). Clips (MR conditional)                            were placed.                           - Three colonic angioectasias. Treated with argon                            plasma coagulation (APC). Clips (MR conditional)                            were placed.                           -  Diverticulosis at the hepatic flexure, in the                            ascending colon and in the cecum.                           - Normal mucosa in the entire examined colon                            otherwise.                           - Non-bleeding non-thrombosed external and internal                            hemorrhoids. Recommendation:           - The patient will be observed post-procedure,                            until all discharge criteria are met.                           - Return patient to hospital ward for ongoing care.                            - Advance diet as tolerated.                           - It is hard to know what is the best time to                            reinitiate anticoagulation. I think consideration                            of 72 hours and heparin gtt would be reasonable if                            absolutely necessary. Discussions with Cardiology                            by primary service as to consideration of another                            NOAC other than Xarelto may be had.                           - Now that patient is unable to have IV Iron due to                            concern for Transfusion reaction, his PO Iron will                            have to be optimized (obviously making darker  stools more present).                           - Held off on VCE today because of the patient's                            yellow stool currently and becaue of the multiple                            findings from the upper and lower endoscopies. IF                            he has recurrent evidence of IDA then will have to                            consider a VCE after a colonoscopy preparation as                            he likely has more AVMs in the small bowel.                           - Hold on starting PO Iron for next 24 hours.                           - The findings and recommendations were discussed                            with the patient.                           - The findings and recommendations were discussed                            with the referring physician. Procedure Code(s):        --- Professional ---                           [phone], Colonoscopy, flexible; with control of                            bleeding, any method Diagnosis Code(s):        --- Professional ---                           K64.1, Second degree hemorrhoids                           K55.20, Angiodysplasia of colon without hemorrhage                            R19.5, Other fecal abnormalities                           D50.0, Iron deficiency anemia secondary to blood  loss (chronic)                           D50.9, Iron deficiency anemia, unspecified                           K57.30, Diverticulosis of large intestine without                            perforation or abscess without bleeding CPT copyright 2019 [employer]. All rights reserved. The codes documented in this report are preliminary and upon coder review may  be revised to meet current compliance requirements. Justice Britain, MD 11/06/2018 10:20:19 AM Number of Addenda: 0

## 2018-11-06 NOTE — Anesthesia Postprocedure Evaluation (Signed)
Anesthesia Post Note  Patient: NICKOLAI RINKS  Procedure(s) Performed: COLONOSCOPY WITH PROPOFOL (N/A ) ENTEROSCOPY (N/A ) HOT HEMOSTASIS (ARGON PLASMA COAGULATION/BICAP) (N/A ) BIOPSY     Patient location during evaluation: PACU Anesthesia Type: MAC Level of consciousness: awake and alert Pain management: pain level controlled Vital Signs Assessment: post-procedure vital signs reviewed and stable Respiratory status: spontaneous breathing, nonlabored ventilation and respiratory function stable Cardiovascular status: stable and blood pressure returned to baseline Anesthetic complications: no    Last Vitals:  Vitals:   11/06/18 1010 11/06/18 1020  BP: 140/75 (!) 149/85  Pulse: 66 65  Resp: 13 14  Temp:    SpO2: 100% 100%    Last Pain:  Vitals:   11/06/18 0954  TempSrc: Oral  PainSc: 0-No pain                 Audry Pili

## 2018-11-06 NOTE — Progress Notes (Signed)
OT Cancellation Note  Patient Details Name: [name] MRN: [id number] DOB: 23-May-1946   Cancelled Treatment:    Reason Eval/Treat Not Completed: Patient at procedure or test/ unavailable  Malka So 11/06/2018, 8:41 AM  Nestor Lewandowsky, OTR/L Acute Rehabilitation Services Pager: [phone] Office: [phone]

## 2018-11-07 ENCOUNTER — Telehealth: Payer: Self-pay

## 2018-11-07 DIAGNOSIS — Z91048 Other nonmedicinal substance allergy status: Secondary | ICD-10-CM

## 2018-11-07 DIAGNOSIS — Z888 Allergy status to other drugs, medicaments and biological substances status: Secondary | ICD-10-CM

## 2018-11-07 DIAGNOSIS — D509 Iron deficiency anemia, unspecified: Secondary | ICD-10-CM

## 2018-11-07 LAB — CBC
HCT: 27.5 % — ABNORMAL LOW (ref 39.0–52.0)
Hemoglobin: 8.2 g/dL — ABNORMAL LOW (ref 13.0–17.0)
MCH: 22.3 pg — ABNORMAL LOW (ref 26.0–34.0)
MCHC: 29.8 g/dL — ABNORMAL LOW (ref 30.0–36.0)
MCV: 74.9 fL — ABNORMAL LOW (ref 80.0–100.0)
Platelets: 300 10*3/uL (ref 150–400)
RBC: 3.67 MIL/uL — ABNORMAL LOW (ref 4.22–5.81)
RDW: 25.8 % — ABNORMAL HIGH (ref 11.5–15.5)
WBC: 8.1 10*3/uL (ref 4.0–10.5)
nRBC: 0 % (ref 0.0–0.2)

## 2018-11-07 LAB — BASIC METABOLIC PANEL
Anion gap: 12 (ref 5–15)
BUN: 7 mg/dL — ABNORMAL LOW (ref 8–23)
CO2: 27 mmol/L (ref 22–32)
Calcium: 9 mg/dL (ref 8.9–10.3)
Chloride: 103 mmol/L (ref 98–111)
Creatinine, Ser: 1.23 mg/dL (ref 0.61–1.24)
GFR calc Af Amer: >60 mL/min (ref >60)
GFR calc non Af Amer: 57 mL/min — ABNORMAL LOW (ref >60)
Glucose, Bld: 82 mg/dL (ref 70–99)
Potassium: 3.4 mmol/L — ABNORMAL LOW (ref 3.5–5.1)
Sodium: 142 mmol/L (ref 135–145)

## 2018-11-07 MED ORDER — POTASSIUM CHLORIDE CRYS ER 20 MEQ PO TBCR
40.0000 meq | EXTENDED_RELEASE_TABLET | Freq: Once | ORAL | Status: AC
  Administered 2018-11-07: 40 meq via ORAL
  Filled 2018-11-07: qty 2

## 2018-11-07 NOTE — TOC Initial Note (Signed)
Transition of Care [facility]) - Initial/Assessment Note    Patient Details  Name: [name] MRN: [id number] Date of Birth: 1946-03-31  Transition of Care [facility]) CM/SW Contact:    Bartholomew Crews, RN Phone Number: [phone] 11/07/2018, 2:18 PM  Clinical Narrative:                 Spoke with patient at the bedside. He was sitting up in chair. Discussed plans to transition home today. Patient has used [facility] in recent past - referral accepted for RN, PT, OT, but advised that they may not start until Saturday. Patient declines 3N1 at this time stating he doesn't need it at his apartment. Patient states that he will call someone to pick him up and asking when. Advised that his bedside nurse will bring him his discharge instructions and will let him know when to call. Patient states that he uses [facility] stating that they deliver his medications. Reviewed list of medications - discussed no changes except to stop taking his xarelto - patient repeated this back verifying understanding. No other transition of care needs identified.   Expected Discharge Plan: [place] Barriers to Discharge: No Barriers Identified   Patient Goals and CMS Choice   CMS Medicare.gov Compare Post Acute Care list provided to:: Patient Choice offered to / list presented to : Patient  Expected Discharge Plan and Services Expected Discharge Plan: [place] In-house Referral: NA Discharge Planning Services: CM Consult Post Acute Care Choice: Home Health, Durable Medical Equipment Living arrangements for the past 2 months: Independent Living Facility([facility]) Expected Discharge Date: 11/07/18               DME Arranged: Patient refused services, 3-N-1(patient states he doesn't need 3N1) DME Agency: NA       HH Arranged: RN, OT, PT [place] Agency: [place] ([place]) Date [place]: 11/07/18 Time [place]: [place] Representative spoke with  at [place]: Butch Penny  Prior Living Arrangements/Services Living arrangements for the past 2 months: Independent Living Facility([facility]) Lives with:: Self Patient language and need for interpreter reviewed:: Yes              Criminal Activity/Legal Involvement Pertinent to Current Situation/Hospitalization: No - Comment as needed  Activities of Daily Living Home Assistive Devices/Equipment: Cane (specify quad or straight), Walker (specify type) ADL Screening (condition at time of admission) Patient's cognitive ability adequate to safely complete daily activities?: Yes Is the patient deaf or have difficulty hearing?: Yes Does the patient have difficulty seeing, even when wearing glasses/contacts?: No Does the patient have difficulty concentrating, remembering, or making decisions?: Yes Patient able to express need for assistance with ADLs?: Yes Does the patient have difficulty dressing or bathing?: No Independently performs ADLs?: Yes (appropriate for developmental age) Does the patient have difficulty walking or climbing stairs?: Yes Weakness of Legs: Both Weakness of Arms/Hands: None  Permission Sought/Granted                  Emotional Assessment Appearance:: Appears stated age Attitude/Demeanor/Rapport: Engaged Affect (typically observed): Accepting Orientation: : Oriented to Self, Oriented to Place, Oriented to Situation, Oriented to  Time   Psych Involvement: No (comment)  Admission diagnosis:  Symptomatic anemia [D64.9] Gastrointestinal hemorrhage, unspecified gastrointestinal hemorrhage type [K92.2] Patient Active Problem List   Diagnosis Date Noted  . Iron deficiency anemia due to chronic blood loss 11/02/2018  . Chronic heart failure with preserved ejection fraction (  [place])   . GI bleed 11/01/2018  . Arteriovenous malformation of gastrointestinal tract   . Hypoxia 10/08/2018  . Hypervolemia 09/02/2018  . Symptomatic anemia 08/21/2018  . Acute GI  bleeding 08/21/2018  . Dizziness 08/20/2018  . Scalp laceration 06/14/2018  . Deep venous thrombosis ([place]) 05/31/2018  . Trigger little finger of right hand 03/11/2015  . Constipation due to opioid therapy 03/11/2015  . Onychomycosis of toenail 06/05/2014  . Chronic venous insufficiency 11/28/2013  . Diverticulosis 08/26/2013  . Internal hemorrhoids 08/26/2013  . AVM (arteriovenous malformation) of colon 08/26/2013  . Achalasia 05/05/2013  . Healthcare maintenance 04/27/2013  . Rhegmatogenous retinal detachment of right eye 03/18/2013  . Abnormality of gait 10/02/2012  . Obesity, Class II, BMI 35-39.9 04/05/2012  . 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 B12 deficiency 10/01/2011  . Asteatotic eczema 09/21/2011  . Illiteracy 09/14/2010  . Osteoarthritis of right knee 11/25/2009  . Gastroesophageal reflux disease without esophagitis 02/04/2007  . Moderate calcific aortic stenosis 10/09/2006  . Lumbar stenosis with neurogenic claudication 06/16/2006  . Essential hypertension 06/15/2006   PCP:  Lucious Groves, DO Pharmacy:   [facility] [place] [address] Phone: [phone] Fax: [phone]     Social Determinants of Health (SDOH) Interventions    Readmission Risk Interventions No flowsheet data found.

## 2018-11-07 NOTE — Progress Notes (Signed)
DISCHARGE NOTE  [name] to be discharged Home per MD order. Patient verbalized understanding.  Skin clean, dry and intact without evidence of skin break down, no evidence of skin tears noted. IV catheter discontinued intact. Site without signs and symptoms of complications. Dressing and pressure applied. Pt denies pain at the site currently. No complaints noted.  Patient free of lines, drains, and wounds.   Discharge packet assembled. An After Visit Summary (AVS) was printed and given to patient. Patient escorted via wheelchair and discharged to home via private auto.    Babs Sciara, RN

## 2018-11-07 NOTE — Progress Notes (Signed)
Patient ID: [name], male   DOB: [birth year], 73 y.o.   MRN: [id number]    Progress Note   Subjective  Pt feels fine, happy to be eating solid food, no complaints-hoping to go home Hgb 8. 2 stable  Colonoscopy-2 cecal AVM's -ablated, then clipped,3 ascending colon AVM's -ablated and clipped, multiple diverticuli  Enteroscopy-multiple gastric erosions , nonbleeding, 4 proximal jejunal AVM's ablated    Objective   Vital signs in last 24 hours: Temp:  [98 F (36.7 C)-99.1 F (37.3 C)] 98.2 F (36.8 C) (05/14 0927) Pulse Rate:  [70-86] 73 (05/14 0927) Resp:  [16-18] 18 (05/14 0927) BP: (100-142)/(53-82) 118/57 (05/14 0927) SpO2:  [98 %-100 %] 99 % (05/14 0927) Weight:  [829 kg] 116 kg (05/13 2108) Last BM Date: 11/06/18 General:    Elderly AA male  in NAD Heart:  Regular rate and rhythm; no murmurs Lungs: Respirations even and unlabored, lungs CTA bilaterally Abdomen:  Soft, large,nontender and nondistended. Normal bowel sounds. Extremities:  Without edema. Neurologic:  Alert and oriented,  grossly normal neurologically. Psych:  Cooperative. Normal mood and affect.  Intake/Output from previous day: 05/13 0701 - 05/14 0700 In: 1120 [P.O.:720; I.V.:400] Out: 1750 [Urine:1750] Intake/Output this shift: No intake/output data recorded.  Lab Results: Recent Labs    11/05/18 0331 11/06/18 0625 11/07/18 0449  WBC 9.8 8.7 8.1  HGB 8.2* 8.7* 8.2*  HCT 27.8* 29.6* 27.5*  PLT 339 332 300   BMET Recent Labs    11/05/18 0331 11/06/18 0625 11/07/18 0449  NA 141 143 142  K 3.9 3.7 3.4*  CL 103 102 103  CO2 25 27 27   GLUCOSE 90 87 82  BUN 9 5* 7*  CREATININE 1.23 1.18 1.23  CALCIUM 9.5 9.5 9.0   LFT Recent Labs    11/05/18 0331  PROT 6.1*  ALBUMIN 3.0*  AST 21  ALT 18  ALKPHOS 41  BILITOT 1.0   PT/INR No results for input(s): LABPROT, INR in the last 72 hours.     Assessment / Plan:    #61  73 year old African-American male admitted with recurrent  symptomatic anemia, iron deficiency, in setting of anticoagulation with Xarelto for DVT. With history of previous gastric small bowel and colonic AVMs.  Endoscopic evaluation yesterday with colonoscopy and enteroscopy revealed multiple colonic and jejunal AVMs all of which were rated with APC some endoclipped.  Also had fairly diffuse gastric erosions, and diverticulosis.  Patient has been hemodynamically stable and has not had any evidence of active bleeding over the past few days.  #2 chronic iron deficiency-patient will need to continue oral iron at home.  He had adverse reaction to [facility] and should not ever get IV iron.  #3.  DVT diagnosed December 2019-Xarelto has been discontinued [place] will follow-up as an outpatient.  Plan; patient is stable for discharge today from GI perspective. Will need follow-up hemoglobin next week and serial hemoglobins least every 2 weeks thereafter plans to periodically transfuse as needed.  Hopefully will not need to resume anticoagulation in the future. Avoid aspirin and NSAIDs.  We will follow-up gastric biopsies  Rule out H. Pylori   discharge on once daily PPI 40 mg  GI will plan to see as needed outpatient, and will defer to the Family practice service to follow hemoglobin as an outpatient.         Principal Problem:   GI bleed Active Problems:   Symptomatic anemia   Iron deficiency anemia due to chronic blood loss  Chronic heart failure with preserved ejection fraction (HCC)     LOS: 6 days      11/07/2018, 10:27 AM

## 2018-11-07 NOTE — Progress Notes (Signed)
   Subjective: He is hoping to go home today. Denies abdominal pain, n/v. Had a BM but didn't look at it. Instructed him to stop taking his blood thinner b/c risk outweighs benefits now that he's had two GI bleeds since being on the xarelto. No shortness of breath.   Objective:  Vital signs in last 24 hours: Vitals:   11/06/18 1130 11/06/18 1700 11/06/18 2108 11/07/18 0534  BP: (!) 142/78 138/82 100/65 (!) 105/53  Pulse: 77 78 86 70  Resp: 16 18 16 16   Temp: 98 F (36.7 C) 98.4 F (36.9 C) 99.1 F (37.3 C) 98.1 F (36.7 C)  TempSrc: Oral Oral Oral Oral  SpO2: 98% 98% 98% 100%  Weight:   116 kg   Height:       Constitution: NAD, supine in bed Cardio: RUSB murmur III/VI, RRR Respiratory: CTA, no w/r/r  Abdominal: NTTP, soft, non-distended, normal bs MSK: no LE edema, moving all extremities    Assessment/Plan:  Principal Problem:   GI bleed Active Problems:   Symptomatic anemia   Iron deficiency anemia due to chronic blood loss   Chronic heart failure with preserved ejection fraction (HCC)  [name] is a 73 yo male with PMH achalasia, colonic AVMs with recurrent lower GI bleeds, diverticulosis HTN, HTN, HFpEF, aortic stenosiswith hxof GI bleed most recently admitted February of this year. Colonoscopy at this timeshowed 5 AVMs tx with APC, planned repeat 2/2 poor prep.Last EGDduring this admission2/2020 showed non-bleeding angiotectasias in the stomach, duodenum, and jejunum that were treated with APC as well.  GI Bleed  No acute events overnight and hemoglobin stable, tolerating a normal diet well. Colonoscopy and EGD yesterday with multiple non-bleeding AVMs treated with APC. EGD additionally showed gastropathy and colonoscopy internal and external non-bleeding hemorrhoids. He is currently stable for discharge per IM and will f/u GI recs prior to discharge  - stable for discharge, will f/u with GI today prior to discharge - continue protonix 40 mg bid - SCDs  Chronic HFpEF Hypertension  Crackles resolved, no edema. Blood pressure controlled.   - continue home lasix at discharge - continue home bp medications at discharge   Hx of RLE DVT  Discussed holding his xarelto at discharge and risks vs. benefit. He will follow-up in clinic for d-dimer lab. Advised on paying close attention to symptoms of DVT in the future.   Iron Deficiency Anemia  - Iron supplement at discharge   VTE: SCDs IVF: none  Diet: HH  Code: full   Dispo: Anticipated discharge in today.   Marty Heck, DO 11/07/2018, 6:23 AM Pager: [phone]

## 2018-11-07 NOTE — Telephone Encounter (Signed)
HOSPITAL TOC PER DR SANTOS, DISCHARGE 11/07/2018, APPT 11/14/2018.

## 2018-11-07 NOTE — Progress Notes (Signed)
  Date: 11/07/2018  Patient name: [name]  Medical record number: [id number]  Date of birth: 04-Apr-1946   I have seen and evaluated this patient and I have discussed the plan of care with the house staff. Please see their note for complete details. I concur with their findings with the following additions/corrections: [name] was seen this morning on team rounds.  His main concern was when he was going to go home.  Dr. Sharon Seller is arranging discharge today.  He will need frequent CBCs in the outpatient setting and adherence to his oral iron.  No anticoagulation and hopefully try to get a d-dimer in about 4 weeks.  Shawn Crews, MD 11/07/2018, 1:54 PM

## 2018-11-07 NOTE — Progress Notes (Signed)
Physical Therapy Treatment Patient Details Name: [name] MRN: [id number] DOB: 1946/01/05 Today's Date: 11/07/2018    History of Present Illness 73 year old male with an extensive past medical history including achalasia diverticulosis, multiple gastric small bowel and colonic AVMs, unprovoked DVT on Xarelto, history of B12 deficiency and heart failure preserved ejection fraction.  He was sent to the emergency department for evaluation of progressive anemia. Coded 2022-11-13, CPR for 2 minutes, recovered; considering colonoscopy for 5/11    PT Comments    Patient ambulating further distance today, contact guard for safety. Pt lives at assistance living, rec HHPT continue to work with him at next venue.   Follow Up Recommendations  Home health PT;Supervision/Assistance - 24 hour     Equipment Recommendations  3in1 (PT)(per OT)    Recommendations for Other Services       Precautions / Restrictions Precautions Precautions: Fall Restrictions Weight Bearing Restrictions: No    Mobility  Bed Mobility Overal bed mobility: Needs Assistance Bed Mobility: Supine to Sit     Supine to sit: Mod assist     General bed mobility comments: Light mod handheld assist to pull to sit; cues to fully clear feet from EOB  Transfers Overall transfer level: Needs assistance Equipment used: Rolling walker (2 wheeled) Transfers: Sit to/from Stand Sit to Stand: Min guard         General transfer comment: Minguard assist for safety; slow moving, and dependent on UEs ppushing on RW,  but not needing phsyical assist to rise  Ambulation/Gait Ambulation/Gait assistance: Min guard Gait Distance (Feet): 85 Feet Assistive device: Rolling walker (2 wheeled) Gait Pattern/deviations: Step-through pattern;Decreased step length - right;Decreased step length - left;Decreased stride length;Trunk flexed Gait velocity: slow   General Gait Details: increased tolerance today, VSS, no overt LOB with  RW   Stairs             Wheelchair Mobility    Modified Rankin (Stroke Patients Only)       Balance Overall balance assessment: Needs assistance   Sitting balance-Leahy Scale: Good       Standing balance-Leahy Scale: Poor                              Cognition Arousal/Alertness: Awake/alert Behavior During Therapy: WFL for tasks assessed/performed Overall Cognitive Status: Within Functional Limits for tasks assessed                                 General Comments: WFL for simple mobility tasks; Did not remenber/understand what happened with code a few days ago      Exercises      General Comments        Pertinent Vitals/Pain      Home Living                      Prior Function            PT Goals (current goals can now be found in the care plan section) Acute Rehab PT Goals Patient Stated Goal: Did not state, but agreeable to amb PT Goal Formulation: With patient Time For Goal Achievement: 11/17/18 Potential to Achieve Goals: Good    Frequency    Min 3X/week      PT Plan Current plan remains appropriate    Co-evaluation  AM-PAC PT "6 Clicks" Mobility   Outcome Measure  Help needed turning from your back to your side while in a flat bed without using bedrails?: A Little Help needed moving from lying on your back to sitting on the side of a flat bed without using bedrails?: A Little Help needed moving to and from a bed to a chair (including a wheelchair)?: A Little Help needed standing up from a chair using your arms (e.g., wheelchair or bedside chair)?: A Little Help needed to walk in hospital room?: A Little Help needed climbing 3-5 steps with a railing? : A Lot 6 Click Score: 17    End of Session Equipment Utilized During Treatment: Gait belt Activity Tolerance: Patient tolerated treatment well Patient left: in chair;with call bell/phone within reach;with chair alarm set Nurse  Communication: Mobility status PT Visit Diagnosis: Unsteadiness on feet (R26.81);Other abnormalities of gait and mobility (R26.89)     Time: 8638-1771 PT Time Calculation (min) (ACUTE ONLY): 15 min  Charges:  $Gait Training: 8-22 mins                     Reinaldo Berber, PT, DPT Acute Rehabilitation Services Pager: [phone] Office: [phone]     Reinaldo Berber 11/07/2018, 1:02 PM

## 2018-11-08 ENCOUNTER — Encounter (HOSPITAL_COMMUNITY): Payer: Self-pay | Admitting: Gastroenterology

## 2018-11-08 ENCOUNTER — Encounter: Payer: Self-pay | Admitting: Gastroenterology

## 2018-11-08 ENCOUNTER — Telehealth: Payer: Self-pay

## 2018-11-08 NOTE — Telephone Encounter (Signed)
-----   Message from Irving Copas., MD sent at 11/07/2018  5:28 PM EDT ----- Regarding: Update Dear [name],I repeated his upper enteroscopy as well as colonoscopy and did some more work on some angiectasia's.I held off on capsule since we found so many things that needed to be ablated.I think it may be reasonable to have him follow-up in a few weeks time (3 to 4 weeks).He is going to get set up with his primary and with renal for close hemoglobin check in the next couple of weeks.Patty can you pass this to Dr. Loletha Carrow' nurse for scheduling purposes.Thank you.GM

## 2018-11-08 NOTE — Progress Notes (Signed)
Letter sent, left message for patient to call back reference Clinic visit

## 2018-11-08 NOTE — Telephone Encounter (Signed)
Scheduled not out that far, reminder sent in epic to schedule OV for 4 weeks with Dr. Loletha Carrow.

## 2018-11-08 NOTE — Telephone Encounter (Signed)
Transition Care Management Follow-up Telephone Call  Date of discharge and from where: 11/07/18  How have you been since you were released from the hospital? Good   Any questions or concerns? No   Items Reviewed:  Did the pt receive and understand the discharge instructions provided? Pt does can not read-but his home health aide helping pt with instructions and medication  Medications obtained and verified? Yes   Any new allergies since your discharge? No  Dietary orders reviewed? No  Do you have support at home? No -pt lives alone, but has cna that helps him out  Functional Questionnaire: (I = Independent and D = Dependent) ADLs: needs assistance  Bathing/Dressing-needs assistance  Meal Prep- needs assistance  Eating- needs assistance  Maintaining continence- needs assistance  Transferring/Ambulation- needs assistance  Managing Meds-needs assistance  Follow up appointments reviewed:   PCP Hospital f/u appt confirmed? Yes  Scheduled to see on 5/21 @ [facility] f/u appt confirmed? N/A   Are transportation arrangements needed? Yes  (uses [facility])  If their condition worsens, is the pt aware to call PCP or go to the Emergency Dept.? Yes  Was the patient provided with contact information for the PCP's office or ED? Yes  Was to pt encouraged to call back with questions or concerns? Yes

## 2018-11-09 DIAGNOSIS — Z86718 Personal history of other venous thrombosis and embolism: Secondary | ICD-10-CM | POA: Diagnosis not present

## 2018-11-09 DIAGNOSIS — I872 Venous insufficiency (chronic) (peripheral): Secondary | ICD-10-CM | POA: Diagnosis not present

## 2018-11-09 DIAGNOSIS — I700 Atherosclerosis of aorta: Secondary | ICD-10-CM | POA: Diagnosis not present

## 2018-11-09 DIAGNOSIS — D500 Iron deficiency anemia secondary to blood loss (chronic): Secondary | ICD-10-CM | POA: Diagnosis not present

## 2018-11-09 DIAGNOSIS — M48062 Spinal stenosis, lumbar region with neurogenic claudication: Secondary | ICD-10-CM | POA: Diagnosis not present

## 2018-11-09 DIAGNOSIS — K3189 Other diseases of stomach and duodenum: Secondary | ICD-10-CM | POA: Diagnosis not present

## 2018-11-09 DIAGNOSIS — K219 Gastro-esophageal reflux disease without esophagitis: Secondary | ICD-10-CM | POA: Diagnosis not present

## 2018-11-09 DIAGNOSIS — I352 Nonrheumatic aortic (valve) stenosis with insufficiency: Secondary | ICD-10-CM | POA: Diagnosis not present

## 2018-11-09 DIAGNOSIS — M150 Primary generalized (osteo)arthritis: Secondary | ICD-10-CM | POA: Diagnosis not present

## 2018-11-09 DIAGNOSIS — K220 Achalasia of cardia: Secondary | ICD-10-CM | POA: Diagnosis not present

## 2018-11-09 DIAGNOSIS — Z8719 Personal history of other diseases of the digestive system: Secondary | ICD-10-CM | POA: Diagnosis not present

## 2018-11-09 DIAGNOSIS — G629 Polyneuropathy, unspecified: Secondary | ICD-10-CM | POA: Diagnosis not present

## 2018-11-09 DIAGNOSIS — Z87891 Personal history of nicotine dependence: Secondary | ICD-10-CM | POA: Diagnosis not present

## 2018-11-09 DIAGNOSIS — I5032 Chronic diastolic (congestive) heart failure: Secondary | ICD-10-CM | POA: Diagnosis not present

## 2018-11-09 DIAGNOSIS — E538 Deficiency of other specified B group vitamins: Secondary | ICD-10-CM | POA: Diagnosis not present

## 2018-11-09 DIAGNOSIS — I110 Hypertensive heart disease with heart failure: Secondary | ICD-10-CM | POA: Diagnosis not present

## 2018-11-09 DIAGNOSIS — D62 Acute posthemorrhagic anemia: Secondary | ICD-10-CM | POA: Diagnosis not present

## 2018-11-12 ENCOUNTER — Other Ambulatory Visit: Payer: Self-pay | Admitting: *Deleted

## 2018-11-12 ENCOUNTER — Ambulatory Visit: Payer: Medicare Other | Admitting: Podiatry

## 2018-11-12 DIAGNOSIS — M1711 Unilateral primary osteoarthritis, right knee: Secondary | ICD-10-CM

## 2018-11-12 NOTE — Telephone Encounter (Signed)
Last refill was 4/28, do we know why he is requesting an early refill?

## 2018-11-12 NOTE — Telephone Encounter (Signed)
Pt has an [facility] appt on 5/21.

## 2018-11-12 NOTE — Telephone Encounter (Signed)
Called pt - stated he's not out of pain medication yet.

## 2018-11-13 ENCOUNTER — Telehealth: Payer: Self-pay | Admitting: Internal Medicine

## 2018-11-13 NOTE — Telephone Encounter (Signed)
Patient Care manager calling from [place] for Nursing.

## 2018-11-14 ENCOUNTER — Ambulatory Visit (INDEPENDENT_AMBULATORY_CARE_PROVIDER_SITE_OTHER): Payer: Medicare Other | Admitting: Internal Medicine

## 2018-11-14 ENCOUNTER — Encounter: Payer: Self-pay | Admitting: Internal Medicine

## 2018-11-14 ENCOUNTER — Other Ambulatory Visit: Payer: Self-pay

## 2018-11-14 VITALS — BP 112/55 | HR 106 | Temp 98.2°F | Ht 73.0 in | Wt 257.3 lb

## 2018-11-14 DIAGNOSIS — D509 Iron deficiency anemia, unspecified: Secondary | ICD-10-CM

## 2018-11-14 DIAGNOSIS — I10 Essential (primary) hypertension: Secondary | ICD-10-CM

## 2018-11-14 DIAGNOSIS — I5030 Unspecified diastolic (congestive) heart failure: Secondary | ICD-10-CM

## 2018-11-14 DIAGNOSIS — I5032 Chronic diastolic (congestive) heart failure: Secondary | ICD-10-CM

## 2018-11-14 DIAGNOSIS — I82511 Chronic embolism and thrombosis of right femoral vein: Secondary | ICD-10-CM

## 2018-11-14 DIAGNOSIS — Z8719 Personal history of other diseases of the digestive system: Secondary | ICD-10-CM

## 2018-11-14 DIAGNOSIS — I110 Hypertensive heart disease with heart failure: Secondary | ICD-10-CM

## 2018-11-14 DIAGNOSIS — D500 Iron deficiency anemia secondary to blood loss (chronic): Secondary | ICD-10-CM | POA: Diagnosis not present

## 2018-11-14 DIAGNOSIS — K5520 Angiodysplasia of colon without hemorrhage: Secondary | ICD-10-CM

## 2018-11-14 DIAGNOSIS — Z86718 Personal history of other venous thrombosis and embolism: Secondary | ICD-10-CM

## 2018-11-14 DIAGNOSIS — Z79899 Other long term (current) drug therapy: Secondary | ICD-10-CM

## 2018-11-14 DIAGNOSIS — Z9889 Other specified postprocedural states: Secondary | ICD-10-CM

## 2018-11-14 MED ORDER — IRON 325 (65 FE) MG PO TABS: 325.0000 mg | ORAL_TABLET | Freq: Every day | ORAL | 0 refills | Status: DC

## 2018-11-14 NOTE — Assessment & Plan Note (Addendum)
Patient has a history of right lower extremity DVT and had been started on Xarelto in December 2019.  He was recently hospitalized for an acute GI bleed and found to have AVMs.  His Xarelto was held during admission and held on discharge.  His previous DVT was unprovoked and the risk and benefits for stopping anticoagulation had been discussed with him, and they agreed it was best to hold the Xarelto.  On exam patient does have some right lower extremity pain, and bilateral lower extremity edema, no unilateral swelling, skin changes, or other symptoms noted.  Does not appear to have any acute findings.  Plan: -Continue to hold Xarelto -Check a d-dimer in 3 weeks

## 2018-11-14 NOTE — Assessment & Plan Note (Addendum)
Patient was recently hospitalized for an acute GI bleed secondary to AVMs, iron deficiency anemia, right lower extremity DVT, and diastolic heart failure.  Found to have a hemoglobin down to 6.2.  He has a history of AVMs and he received 3 units of packed red blood cells over his hospitalization, he had a colonoscopy that showed a poor prep.  Patient had a repeat colonoscopy done on 5/418, that also showed poor prep.  He was started on Protonix 40 mg twice daily and his Xarelto was held on discharge.  While he was admitted he was given a dose of IV iron however developed an episode of loss of consciousness and a code was called for PEA arrest however there was unclear if this was an actual code He was supposed to be started on iron supplements however this appears to be an over-the-counter medication so his pharmacy did not deliver this.  They patient reports that he is doing well, he denies any recurrence of dark stools or hematochezia.  He denies any dizziness, lightheadedness, fatigue, chest pain, fevers chills, nausea, vomiting, or other symptoms.  He reports that he never got the iron supplements and was requesting to be started on this.  He also reported some right lower extremity pain, he states that it is chronic and there is no significant changes than his typical pain.  On exam his cardiac and pulmonary exam are normal, he did have bilateral lower extremity edema, up to the shins, no tenderness to palpation or skin changes.  Overall it appears that he is tolerating his current regimen well.  We will repeat labs and have him follow-up in 3 weeks to obtain the d-dimer.  Plan: -Continue iron supplementation, sent in prescription to pharmacy -Continue pantoprazole 40 mg twice daily -Check CBC and BMP -Continue to hold Xarelto -Return to clinic in 3 weeks to check d-dimer

## 2018-11-14 NOTE — Assessment & Plan Note (Signed)
Patient is currently on verapamil 180mg  daily, he denies any issues taking his medications.  Blood pressure today is well controlled.    BP Readings from Last 3 Encounters:  11/14/18 (!) 112/55  11/07/18 (!) 118/57  09/13/18 120/60   Plan: -Continue verapamil 180 mg daily

## 2018-11-14 NOTE — Telephone Encounter (Signed)
Return call to Shaunda PT/PCM with [facility] - requesting verbal orders for "Nursing to see pt once a week x 2 weeks; twice a week x 1 week then once a week x 1 week" for assessment,vs, f/u gi bleed,anemia,chf. VO given - if not appropriate let me know. Thanks

## 2018-11-14 NOTE — Assessment & Plan Note (Signed)
Denies history of diastolic heart failure and had been on Lasix 40 mg daily prior to his recent hospitalization.  During that time he was found to have a GI bleed and had soft blood pressures so this was held.  Patient was euvolemic on date of discharge so Lasix was restarted.  Patient denies any issues at this time, denies any shortness of breath chest pain, headaches, nausea, vomiting, or other symptoms.  On exam his lungs are clear to auscultation, he does have bilateral lower extremity edema to his shin that he states is chronic.  Plan: -Continue Lasix 40 mg daily

## 2018-11-14 NOTE — Patient Instructions (Addendum)
[name],  It was a pleasure to see you today. Thank you for coming in.   Today we discussed your recent hospitalization. I am glad that you are feeling well, and that you have not been having any dark stools.  We are checking some labs and I will send in a prescription for iron pills.  Please continue taking your current medications.  I will call you with the results of your labs are normal.  Please return to clinic in 3 weeks or sooner if needed.   Thank you again for coming in.   [name].D.

## 2018-11-14 NOTE — Progress Notes (Signed)
   CC: Recent hospitalization for iron deficiency anemia and GI bleed, history of right lower extremity DVT and diastolic heart failure  HPI:  [name] is a 73 y.o.   Past Medical History:  Diagnosis Date  . 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  Excellent symptomatic response to LES Botox injections.   . Anemia 08/20/2018  . AVM (arteriovenous malformation) of colon 08/26/2013   Cecum X 3, without bleeding on colonoscopy February 2015   . B12 deficiency 10/01/2011   Discovered to 2 progressive neurologic pain and dysfunction.  Diagnosis established March 2013.  Requires the following treatment: B12 IM monthly until level normalizes. After that, oral supplementation.   . Cataract   . Chronic venous insufficiency 11/28/2013  . Constipation due to opioid therapy 03/11/2015  . Deep venous thrombosis ([place]) 05/31/2018   Right lower extremity, unprovoked  . Diverticulosis 08/26/2013  . Essential hypertension 06/15/2006  . Family history of malignant neoplasm of gastrointestinal tract 06/30/2013   Father with colon cancer age 76   . Gastroesophageal reflux disease 02/04/2007  . GI bleeding 08/21/2018  . Internal hemorrhoids 08/26/2013  . Left posterior subcapsular cataract 10/03/2013   s/p yag laser capsulotomy 10/03/2013   . 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  Complicated by chronic low back pain and neuropathy.  Nerve conduction study (10/19/11): Diffusely low motor amplitudes, with primarily involvement of the peroneal and posterior tibial nerves. No evidence of generalized peripheral neuropathy. Findings could be c/w bil multilevel lumbosacral radiculopathies or a primary motor neuropathy.   . Mild aortic valve stenosis 10/09/2006   Echo  (12/09/2009): Valve area: 2.03 cm2   . Neuropathy   . Obesity, Class I, BMI 30-34.9 04/05/2012  . Onychomycosis of toenail 06/05/2014  . Osteoarthritis 11/25/2009   Right knee (medial compartment), right hip, left shoulder   . 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 Trigger little finger of right hand 03/11/2015   Review of Systems: Denies any lightheadedness, melena, hematochezia, dizziness, chest pain, shortness of breath, fevers, chills, headaches, abdominal pain, or other symptoms.  Endorses some chronic right lower extremity pain.  Physical Exam:  Vitals:   11/14/18 1011  BP: (!) 112/55  Pulse: (!) 106  Temp: 98.2 F (36.8 C)  TempSrc: Oral  SpO2: 98%  Weight: 257 lb 4.8 oz (116.7 kg)  Height: 6\' 1"  (1.854 m)   Physical Exam  Constitutional: He is oriented to person, place, and time and well-developed, well-nourished, and in no distress.  HENT:  Head: Normocephalic and atraumatic.  Cardiovascular: Normal rate, regular rhythm and normal heart sounds.  Pulmonary/Chest: Effort normal and breath sounds normal. No respiratory distress.  Abdominal: Soft. Bowel sounds are normal. He exhibits no distension.  Musculoskeletal: Normal range of motion.        General: Edema (BL LE edema 1-2+ up to shins, non-tender, no erythema) present.  Neurological: He is oriented to person, place, and time.  Skin: Skin is warm and dry.  Psychiatric: Mood and affect normal.      Assessment & Plan:   See Encounters Tab for problem based charting.  Patient discussed with Dr. Dareen Piano

## 2018-11-15 LAB — CBC
Hematocrit: 32.2 % — ABNORMAL LOW (ref 37.5–51.0)
Hemoglobin: 9.5 g/dL — ABNORMAL LOW (ref 13.0–17.7)
MCH: 22.8 pg — ABNORMAL LOW (ref 26.6–33.0)
MCHC: 29.5 g/dL — ABNORMAL LOW (ref 31.5–35.7)
MCV: 77 fL — ABNORMAL LOW (ref 79–97)
Platelets: 223 10*3/uL (ref 150–450)
RBC: 4.17 x10E6/uL (ref 4.14–5.80)
RDW: 23.6 % — ABNORMAL HIGH (ref 11.6–15.4)
WBC: 6.6 10*3/uL (ref 3.4–10.8)

## 2018-11-15 LAB — BMP8+ANION GAP
Anion Gap: 15 mmol/L (ref 10.0–18.0)
BUN/Creatinine Ratio: 11 (ref 10–24)
BUN: 14 mg/dL (ref 8–27)
CO2: 25 mmol/L (ref 20–29)
Calcium: 9.9 mg/dL (ref 8.6–10.2)
Chloride: 101 mmol/L (ref 96–106)
Creatinine, Ser: 1.3 mg/dL — ABNORMAL HIGH (ref 0.76–1.27)
GFR calc Af Amer: 62 mL/min/{1.73_m2} (ref >59)
GFR calc non Af Amer: 53 mL/min/{1.73_m2} — ABNORMAL LOW (ref >59)
Glucose: 77 mg/dL (ref 65–99)
Potassium: 4.1 mmol/L (ref 3.5–5.2)
Sodium: 141 mmol/L (ref 134–144)

## 2018-11-15 NOTE — Progress Notes (Signed)
Internal Medicine Clinic Attending  Case discussed with Dr. Krienke at the time of the visit.  We reviewed the resident's history and exam and pertinent patient test results.  I agree with the assessment, diagnosis, and plan of care documented in the resident's note.    

## 2018-11-15 NOTE — Addendum Note (Signed)
Addended by: Aldine Contes on: 11/15/2018 12:12 PM   Modules accepted: Level of Service

## 2018-11-16 DIAGNOSIS — Z8719 Personal history of other diseases of the digestive system: Secondary | ICD-10-CM | POA: Diagnosis not present

## 2018-11-16 DIAGNOSIS — G629 Polyneuropathy, unspecified: Secondary | ICD-10-CM | POA: Diagnosis not present

## 2018-11-16 DIAGNOSIS — D62 Acute posthemorrhagic anemia: Secondary | ICD-10-CM | POA: Diagnosis not present

## 2018-11-16 DIAGNOSIS — K219 Gastro-esophageal reflux disease without esophagitis: Secondary | ICD-10-CM | POA: Diagnosis not present

## 2018-11-16 DIAGNOSIS — M48062 Spinal stenosis, lumbar region with neurogenic claudication: Secondary | ICD-10-CM | POA: Diagnosis not present

## 2018-11-16 DIAGNOSIS — Z86718 Personal history of other venous thrombosis and embolism: Secondary | ICD-10-CM | POA: Diagnosis not present

## 2018-11-16 DIAGNOSIS — K220 Achalasia of cardia: Secondary | ICD-10-CM | POA: Diagnosis not present

## 2018-11-16 DIAGNOSIS — I872 Venous insufficiency (chronic) (peripheral): Secondary | ICD-10-CM | POA: Diagnosis not present

## 2018-11-16 DIAGNOSIS — K3189 Other diseases of stomach and duodenum: Secondary | ICD-10-CM | POA: Diagnosis not present

## 2018-11-16 DIAGNOSIS — Z87891 Personal history of nicotine dependence: Secondary | ICD-10-CM | POA: Diagnosis not present

## 2018-11-16 DIAGNOSIS — I5032 Chronic diastolic (congestive) heart failure: Secondary | ICD-10-CM | POA: Diagnosis not present

## 2018-11-16 DIAGNOSIS — I110 Hypertensive heart disease with heart failure: Secondary | ICD-10-CM | POA: Diagnosis not present

## 2018-11-16 DIAGNOSIS — M150 Primary generalized (osteo)arthritis: Secondary | ICD-10-CM | POA: Diagnosis not present

## 2018-11-16 DIAGNOSIS — E538 Deficiency of other specified B group vitamins: Secondary | ICD-10-CM | POA: Diagnosis not present

## 2018-11-16 DIAGNOSIS — I700 Atherosclerosis of aorta: Secondary | ICD-10-CM | POA: Diagnosis not present

## 2018-11-16 DIAGNOSIS — I352 Nonrheumatic aortic (valve) stenosis with insufficiency: Secondary | ICD-10-CM | POA: Diagnosis not present

## 2018-11-16 DIAGNOSIS — D500 Iron deficiency anemia secondary to blood loss (chronic): Secondary | ICD-10-CM | POA: Diagnosis not present

## 2018-11-20 ENCOUNTER — Other Ambulatory Visit: Payer: Self-pay | Admitting: *Deleted

## 2018-11-20 DIAGNOSIS — M1711 Unilateral primary osteoarthritis, right knee: Secondary | ICD-10-CM

## 2018-11-20 NOTE — Telephone Encounter (Signed)
Last office visit: 11/14/18 Last UDS: 08/28/17 Last Refill: 10/22/18 Next appt: 12/05/18 w/pcp

## 2018-11-21 DIAGNOSIS — D62 Acute posthemorrhagic anemia: Secondary | ICD-10-CM | POA: Diagnosis not present

## 2018-11-21 DIAGNOSIS — M150 Primary generalized (osteo)arthritis: Secondary | ICD-10-CM | POA: Diagnosis not present

## 2018-11-21 DIAGNOSIS — Z86718 Personal history of other venous thrombosis and embolism: Secondary | ICD-10-CM | POA: Diagnosis not present

## 2018-11-21 DIAGNOSIS — D500 Iron deficiency anemia secondary to blood loss (chronic): Secondary | ICD-10-CM | POA: Diagnosis not present

## 2018-11-21 DIAGNOSIS — K3189 Other diseases of stomach and duodenum: Secondary | ICD-10-CM | POA: Diagnosis not present

## 2018-11-21 DIAGNOSIS — G629 Polyneuropathy, unspecified: Secondary | ICD-10-CM | POA: Diagnosis not present

## 2018-11-21 DIAGNOSIS — I5032 Chronic diastolic (congestive) heart failure: Secondary | ICD-10-CM | POA: Diagnosis not present

## 2018-11-21 DIAGNOSIS — K219 Gastro-esophageal reflux disease without esophagitis: Secondary | ICD-10-CM | POA: Diagnosis not present

## 2018-11-21 DIAGNOSIS — I872 Venous insufficiency (chronic) (peripheral): Secondary | ICD-10-CM | POA: Diagnosis not present

## 2018-11-21 DIAGNOSIS — M48062 Spinal stenosis, lumbar region with neurogenic claudication: Secondary | ICD-10-CM | POA: Diagnosis not present

## 2018-11-21 DIAGNOSIS — I110 Hypertensive heart disease with heart failure: Secondary | ICD-10-CM | POA: Diagnosis not present

## 2018-11-21 DIAGNOSIS — K220 Achalasia of cardia: Secondary | ICD-10-CM | POA: Diagnosis not present

## 2018-11-21 DIAGNOSIS — Z87891 Personal history of nicotine dependence: Secondary | ICD-10-CM | POA: Diagnosis not present

## 2018-11-21 DIAGNOSIS — I700 Atherosclerosis of aorta: Secondary | ICD-10-CM | POA: Diagnosis not present

## 2018-11-21 DIAGNOSIS — I352 Nonrheumatic aortic (valve) stenosis with insufficiency: Secondary | ICD-10-CM | POA: Diagnosis not present

## 2018-11-21 DIAGNOSIS — E538 Deficiency of other specified B group vitamins: Secondary | ICD-10-CM | POA: Diagnosis not present

## 2018-11-21 DIAGNOSIS — Z8719 Personal history of other diseases of the digestive system: Secondary | ICD-10-CM | POA: Diagnosis not present

## 2018-11-25 ENCOUNTER — Telehealth: Payer: Self-pay | Admitting: Internal Medicine

## 2018-11-25 NOTE — Telephone Encounter (Signed)
Pt cancel appt with Saturday with [place] per  [place] [phone]

## 2018-11-26 ENCOUNTER — Telehealth: Payer: Self-pay

## 2018-11-26 DIAGNOSIS — E538 Deficiency of other specified B group vitamins: Secondary | ICD-10-CM | POA: Diagnosis not present

## 2018-11-26 DIAGNOSIS — D62 Acute posthemorrhagic anemia: Secondary | ICD-10-CM | POA: Diagnosis not present

## 2018-11-26 DIAGNOSIS — D500 Iron deficiency anemia secondary to blood loss (chronic): Secondary | ICD-10-CM | POA: Diagnosis not present

## 2018-11-26 DIAGNOSIS — Z8719 Personal history of other diseases of the digestive system: Secondary | ICD-10-CM | POA: Diagnosis not present

## 2018-11-26 DIAGNOSIS — Z86718 Personal history of other venous thrombosis and embolism: Secondary | ICD-10-CM | POA: Diagnosis not present

## 2018-11-26 DIAGNOSIS — Z87891 Personal history of nicotine dependence: Secondary | ICD-10-CM | POA: Diagnosis not present

## 2018-11-26 DIAGNOSIS — I872 Venous insufficiency (chronic) (peripheral): Secondary | ICD-10-CM | POA: Diagnosis not present

## 2018-11-26 DIAGNOSIS — M48062 Spinal stenosis, lumbar region with neurogenic claudication: Secondary | ICD-10-CM | POA: Diagnosis not present

## 2018-11-26 DIAGNOSIS — I700 Atherosclerosis of aorta: Secondary | ICD-10-CM | POA: Diagnosis not present

## 2018-11-26 DIAGNOSIS — M150 Primary generalized (osteo)arthritis: Secondary | ICD-10-CM | POA: Diagnosis not present

## 2018-11-26 DIAGNOSIS — K219 Gastro-esophageal reflux disease without esophagitis: Secondary | ICD-10-CM | POA: Diagnosis not present

## 2018-11-26 DIAGNOSIS — I5032 Chronic diastolic (congestive) heart failure: Secondary | ICD-10-CM | POA: Diagnosis not present

## 2018-11-26 DIAGNOSIS — G629 Polyneuropathy, unspecified: Secondary | ICD-10-CM | POA: Diagnosis not present

## 2018-11-26 DIAGNOSIS — I352 Nonrheumatic aortic (valve) stenosis with insufficiency: Secondary | ICD-10-CM | POA: Diagnosis not present

## 2018-11-26 DIAGNOSIS — I110 Hypertensive heart disease with heart failure: Secondary | ICD-10-CM | POA: Diagnosis not present

## 2018-11-26 DIAGNOSIS — K220 Achalasia of cardia: Secondary | ICD-10-CM | POA: Diagnosis not present

## 2018-11-26 DIAGNOSIS — K3189 Other diseases of stomach and duodenum: Secondary | ICD-10-CM | POA: Diagnosis not present

## 2018-11-26 NOTE — Telephone Encounter (Signed)
Pt scheduled to see Dr. Berenice Bouton 12/04/18@10 :30am. Pt aware of appt.

## 2018-11-26 NOTE — Telephone Encounter (Signed)
-----   Message from [place], MD sent at 11/11/2018  7:27 AM EDT ----- Regarding: FW: Update It will need to be an in-person visit.  Phone or video conference would be a significant challenge with this patient.  - HD ----- Message ----- From: Irving Copas., MD Sent: 11/07/2018   5:28 PM EDT To: Timothy Lasso, RN, Doran Stabler, MD Subject: Update                                         Dear Henry,I repeated his upper enteroscopy as well as colonoscopy and did some more work on some angiectasia's.I held off on capsule since we found so many things that needed to be ablated.I think it may be reasonable to have him follow-up in a few weeks time (3 to 4 weeks).He is going to get set up with his primary and with renal for close hemoglobin check in the next couple of weeks.Patty can you pass this to Dr. Loletha Carrow' nurse for scheduling purposes.Thank you.GM

## 2018-11-27 ENCOUNTER — Encounter: Payer: Self-pay | Admitting: *Deleted

## 2018-11-29 MED ORDER — OXYCODONE-ACETAMINOPHEN 10-325 MG PO TABS: 1.0000 | ORAL_TABLET | Freq: Three times a day (TID) | ORAL | 0 refills | Status: DC | PRN

## 2018-12-02 ENCOUNTER — Other Ambulatory Visit: Payer: Self-pay | Admitting: Internal Medicine

## 2018-12-02 DIAGNOSIS — E8770 Fluid overload, unspecified: Secondary | ICD-10-CM

## 2018-12-03 ENCOUNTER — Telehealth: Payer: Self-pay | Admitting: General Surgery

## 2018-12-03 NOTE — Telephone Encounter (Signed)
Covid-19 screening questions  Have you traveled in the last 14 days? NO If yes where?  Do you now or have you had a fever in the last 14 days? NO  Do you have any respiratory symptoms of shortness of breath or cough now or in the last 14 days? NO  Do you have any family members or close contacts with diagnosed or suspected Covid-19 in the past 14 days? NO  Have you been tested for Covid-19 and found to be positive? NO   The patient was instructed to wear a mask to his visit and to come to his appointment alone. The patient verbalized understanding

## 2018-12-04 ENCOUNTER — Other Ambulatory Visit (INDEPENDENT_AMBULATORY_CARE_PROVIDER_SITE_OTHER): Payer: Medicare Other

## 2018-12-04 ENCOUNTER — Encounter: Payer: Self-pay | Admitting: Gastroenterology

## 2018-12-04 ENCOUNTER — Ambulatory Visit (INDEPENDENT_AMBULATORY_CARE_PROVIDER_SITE_OTHER): Payer: Medicare Other | Admitting: Gastroenterology

## 2018-12-04 VITALS — BP 128/62 | HR 76 | Temp 98.1°F | Ht 74.0 in | Wt 246.0 lb

## 2018-12-04 DIAGNOSIS — R1310 Dysphagia, unspecified: Secondary | ICD-10-CM

## 2018-12-04 DIAGNOSIS — K5520 Angiodysplasia of colon without hemorrhage: Secondary | ICD-10-CM

## 2018-12-04 DIAGNOSIS — R1319 Other dysphagia: Secondary | ICD-10-CM

## 2018-12-04 DIAGNOSIS — D500 Iron deficiency anemia secondary to blood loss (chronic): Secondary | ICD-10-CM

## 2018-12-04 LAB — CBC WITH DIFFERENTIAL/PLATELET
Absolute Monocytes: 852 cells/uL (ref 200–950)
Basophils Absolute: 43 cells/uL (ref 0–200)
Basophils Relative: 0.6 %
Eosinophils Absolute: 78 cells/uL (ref 15–500)
Eosinophils Relative: 1.1 %
HCT: 38.6 % (ref 38.5–50.0)
Hemoglobin: 11.6 g/dL — ABNORMAL LOW (ref 13.2–17.1)
Lymphs Abs: 1775 cells/uL (ref 850–3900)
MCH: 23.7 pg — ABNORMAL LOW (ref 27.0–33.0)
MCHC: 30.1 g/dL — ABNORMAL LOW (ref 32.0–36.0)
MCV: 78.9 fL — ABNORMAL LOW (ref 80.0–100.0)
MPV: 10.1 fL (ref 7.5–12.5)
Monocytes Relative: 12 %
Neutro Abs: 4352 cells/uL (ref 1500–7800)
Neutrophils Relative %: 61.3 %
Platelets: 245 10*3/uL (ref 140–400)
RBC: 4.89 10*6/uL (ref 4.20–5.80)
RDW: 24.9 % — ABNORMAL HIGH (ref 11.0–15.0)
Total Lymphocyte: 25 %
WBC: 7.1 10*3/uL (ref 3.8–10.8)

## 2018-12-04 LAB — IBC + FERRITIN
Ferritin: 21.4 ng/mL — ABNORMAL LOW (ref 22.0–322.0)
Iron: 198 ug/dL — ABNORMAL HIGH (ref 42–165)
Saturation Ratios: 54.2 % — ABNORMAL HIGH (ref 20.0–50.0)
Transferrin: 261 mg/dL (ref 212.0–360.0)

## 2018-12-04 LAB — CBC MORPHOLOGY

## 2018-12-04 NOTE — Patient Instructions (Signed)
If you are age 75 or older, your body mass index should be between 23-30. Your Body mass index is 31.58 kg/m. If this is out of the aforementioned range listed, please consider follow up with your Primary Care Provider.  If you are age 34 or younger, your body mass index should be between 19-25. Your Body mass index is 31.58 kg/m. If this is out of the aformentioned range listed, please consider follow up with your Primary Care Provider.   Your provider has requested that you go to the basement level for lab work before leaving today. Press "B" on the elevator. The lab is located at the first door on the left as you exit the elevator.

## 2018-12-04 NOTE — Progress Notes (Signed)
[facility] Progress Note  Chief Complaint: Anemia of chronic GI blood loss  Subjective  History: History of achalasia requiring Botox of the LES.  Most notably, has developed persistent iron deficiency anemia from chronic small bowel AVM blood loss.  In February of this year, patient colonoscopy by Dr. Rush Landmark revealed AVMs that were fulgurated, poor prep.  Small bowel enteroscopy also revealed AVMs.  Patient readmitted several weeks ago with same scenario.  Again colonoscopy AVMs fulgurated, prep better than before.  Enteroscopy to jejunum found several diminutive nonbleeding AVMs also fulgurated with APC.  Patient reportedly had a reaction to IV iron. (The patient's case was discussed with Dr. Rush Landmark during that hospital stay) The patient was on Xarelto for previous DVT, and this medicine was discontinued at recent discharge.  He has since followed up with internal medicine clinic, they have decided to the patient off oral anticoagulation, and also determined that he had not received prescription for oral iron at the time of discharge.  This was rectified, and the patient reports he has been taking it daily.  As before, Aryaan is a limited historian.  His stool has turned dark on once daily iron, he denies abdominal pain, dysphagia or vomiting.  ROS: Cardiovascular:  no chest pain Respiratory: no dyspnea Generalized fatigue. Remainder systems are negative as near as can be determined-poor historian  The patient's Past Medical, Family and Social History were reviewed and are on file in the EMR.  Objective:  Med list reviewed  Current Outpatient Medications:  .  COMBIGAN 0.2-0.5 % ophthalmic solution, Place 1 drop into both eyes 2 (two) times daily., Disp: , Rfl:  .  Ferrous Sulfate (IRON) 325 (65 Fe) MG TABS, Take 1 tablet (325 mg total) by mouth daily., Disp: 30 each, Rfl: 0 .  furosemide (LASIX) 40 MG tablet, TAKE 1 TABLET BY MOUTH DAILY., Disp: 30 tablet, Rfl: 11 .   gabapentin (NEURONTIN) 300 MG capsule, Take 2 capsules (600 mg total) by mouth 3 (three) times daily. (Patient taking differently: Take 600 mg by mouth 2 (two) times daily. ), Disp: 540 capsule, Rfl: 3 .  hydrocortisone cream 1 %, Apply to itchy area 2 times daily (Patient taking differently: Apply 1 application topically 2 (two) times daily as needed. ), Disp: 45 g, Rfl: 1 .  oxyCODONE-acetaminophen (PERCOCET) 10-325 MG tablet, Take 1 tablet by mouth every 8 (eight) hours as needed for pain., Disp: 75 tablet, Rfl: 0 .  pantoprazole (PROTONIX) 40 MG tablet, Take 1 tablet (40 mg total) by mouth 2 (two) times daily., Disp: 180 tablet, Rfl: 0 .  sorbitol 70 % solution, Take 15 mLs by mouth daily as needed. (Patient taking differently: Take 15 mLs by mouth daily as needed (for constipation). ), Disp: 3840 mL, Rfl: 3 .  verapamil (CALAN-SR) 180 MG CR tablet, Take 1 tablet (180 mg total) by mouth daily., Disp: 90 tablet, Rfl: 3 .  vitamin B-12 (CYANOCOBALAMIN) 1000 MCG tablet, Take 1 tablet (1,000 mcg total) by mouth daily., Disp: 90 tablet, Rfl: 3   Vital signs in last 24 hrs: Vitals:   12/04/18 1005  BP: 128/62  Pulse: 76  Temp: 98.1 F (36.7 C)    Physical Exam  Chronically ill-appearing man, sitting in a rolling walker  HEENT: sclera anicteric, oral mucosa moist without lesions  Neck: supple, no thyromegaly, JVD or lymphadenopathy  Cardiac: RRR without murmurs, S1S2 heard, peripheral edema bilaterally  Pulm: clear to auscultation bilaterally, normal RR and effort noted  Abdomen: soft, obese, no tenderness, with active bowel sounds. No guarding or palpable hepatosplenomegaly, omitted by body habitus  Skin; warm and dry, no jaundice or rash  Recent Labs:  CBC Latest Ref Rng & Units 11/14/2018 11/07/2018 11/06/2018  WBC 3.4 - 10.8 x10E3/uL 6.6 8.1 8.7  Hemoglobin 13.0 - 17.7 g/dL 9.5(L) 8.2(L) 8.7(L)  Hematocrit 37.5 - 51.0 % 32.2(L) 27.5(L) 29.6(L)  Platelets 150 - 450 x10E3/uL 223  300 332   Recent endoscopic reports were reviewed    @ASSESSMENTPLANBEGIN @ Assessment: Encounter Diagnoses  Name Primary?  . Iron deficiency anemia due to chronic blood loss Yes  . AVM (arteriovenous malformation) of colon   . AVM (arteriovenous malformation) of small bowel, acquired   . Esophageal dysphagia    Medically complex patient with chronic occult GI blood loss due to both small bowel and colonic AVMs.  None were actively bleeding at the time of procedures, multiple lesions were ablated.  It seems he most likely has more distal small bowel AVMs as well.  I am glad he is off oral anticoagulation, because there did not appear to be any durable endoscopic solution to this which would allow complete cessation of blood loss, stabilization of anemia and therefore safe long-term use of oral anticoagulation.  I am hopeful that with sufficient iron supplementation we can maintain a reasonably good hemoglobin and iron level for him and try to keep him out of the hospital.  It is unfortunate that he appeared to have a severe allergic reaction to IV iron.   Plan:  CBC and iron studies today.  With that, I can decide the dosage which to make a new iron prescription. If he is not coming up as expected, referral to hematology for assistance in consideration of any other IV iron therapies that might be tried, as well as potential need for periodic transfusion if hemoglobin gets low enough. Very challenging patient to manage with his multiple complex medical issues and poor health literacy.  Total time 28 minutes, over half spent face-to-face with patient in counseling and coordination of care. (Extensive chart review and discussion with patient required)  [name]

## 2018-12-05 ENCOUNTER — Ambulatory Visit: Payer: Medicare Other | Admitting: Internal Medicine

## 2018-12-05 ENCOUNTER — Other Ambulatory Visit: Payer: Self-pay | Admitting: Gastroenterology

## 2018-12-05 DIAGNOSIS — D500 Iron deficiency anemia secondary to blood loss (chronic): Secondary | ICD-10-CM

## 2018-12-05 MED ORDER — IRON 325 (65 FE) MG PO TABS: 325.0000 mg | ORAL_TABLET | Freq: Every day | ORAL | 2 refills | Status: DC

## 2018-12-06 ENCOUNTER — Other Ambulatory Visit: Payer: Self-pay

## 2018-12-06 DIAGNOSIS — D500 Iron deficiency anemia secondary to blood loss (chronic): Secondary | ICD-10-CM

## 2018-12-12 ENCOUNTER — Encounter: Payer: Self-pay | Admitting: Internal Medicine

## 2018-12-12 ENCOUNTER — Ambulatory Visit (INDEPENDENT_AMBULATORY_CARE_PROVIDER_SITE_OTHER): Payer: Medicare Other | Admitting: Internal Medicine

## 2018-12-12 ENCOUNTER — Other Ambulatory Visit: Payer: Self-pay

## 2018-12-12 VITALS — BP 110/67 | HR 74 | Temp 98.5°F | Ht 74.0 in | Wt 253.8 lb

## 2018-12-12 DIAGNOSIS — D500 Iron deficiency anemia secondary to blood loss (chronic): Secondary | ICD-10-CM | POA: Diagnosis not present

## 2018-12-12 DIAGNOSIS — I5032 Chronic diastolic (congestive) heart failure: Secondary | ICD-10-CM

## 2018-12-12 DIAGNOSIS — I110 Hypertensive heart disease with heart failure: Secondary | ICD-10-CM | POA: Diagnosis not present

## 2018-12-12 DIAGNOSIS — Z79891 Long term (current) use of opiate analgesic: Secondary | ICD-10-CM | POA: Diagnosis not present

## 2018-12-12 DIAGNOSIS — I10 Essential (primary) hypertension: Secondary | ICD-10-CM

## 2018-12-12 DIAGNOSIS — I872 Venous insufficiency (chronic) (peripheral): Secondary | ICD-10-CM

## 2018-12-12 DIAGNOSIS — Z79899 Other long term (current) drug therapy: Secondary | ICD-10-CM

## 2018-12-12 NOTE — Patient Instructions (Signed)
I want you to start putting on your compression stockings first thing in the morning before the swelling starts in your legs.  Otherwise I am glad you are feeling better!

## 2018-12-12 NOTE — Progress Notes (Signed)
Subjective:  HPI: [name] is a 73 y.o. male who presents for f/u hospitalization for GI bleeding/anemia  Please see Assessment and Plan below for the status of his chronic medical problems.  Review of Systems: Review of Systems  Constitutional: Negative for malaise/fatigue and weight loss.  Respiratory: Negative for cough and shortness of breath.   Cardiovascular: Positive for leg swelling. Negative for chest pain, orthopnea and claudication.  Genitourinary: Negative for dysuria.  Musculoskeletal: Negative for myalgias.  Neurological: Negative for dizziness.    Objective:  Physical Exam: Vitals:   12/12/18 0852  BP: 110/67  Pulse: 74  Temp: 98.5 F (36.9 C)  TempSrc: Oral  SpO2: 100%  Weight: 253 lb 12.8 oz (115.1 kg)  Height: 6\' 2"  (1.88 m)   Body mass index is 32.59 kg/m. Physical Exam Constitutional:      Appearance: Normal appearance.  Cardiovascular:     Rate and Rhythm: Normal rate and regular rhythm.  Pulmonary:     Effort: Pulmonary effort is normal.     Breath sounds: Normal breath sounds.  Abdominal:     Palpations: Abdomen is soft.  Musculoskeletal:     Right lower leg: Edema (2+ to mid leg) present.     Left lower leg: Edema (2+ to mid leg) present.  Neurological:     Mental Status: He is alert.    Assessment & Plan:  Essential hypertension HPI: No issues, taking medications as prescribed  A: Essential HTN well controlled  P: Continue Verapamil 180mg  daily  Chronic venous insufficiency HPI: Chief complaint this morning is of lower extremity swelling.  He reports that his swelling is worse after he is on his feet for a few hours.  Typically is better in the morning when he first gets out of bed.  He notes that he takes a fluid pill (Lasix) and he feels that he has been urinating fine.  He reports to me that he has tried some compression stockings in the past he still has them at home however he has not used them in the past few years.   Assessment venous insufficiency  Plan Had extensive discussion with him about the nature of lower extremity edema I will have him continue his Lasix however I think that much of this is chronic venous insufficiency and really needs to be treated with compression therapy.  I discussed with him using medical grade compression stockings he reports he already has these at home I discussed with him appropriate skin care while using this.  He does have a history of DVT he is now off anticoagulation I do not suspect DVT as a cause of his bilateral lower extremity edema that is variable throughout the day.  Chronic heart failure with preserved ejection fraction (HCC) HPI: He reports to me that he is doing well he denies any orthopnea he does get a little short of breath with prolonged walking.  He has been taking his Lasix as directed.  Assessment chronic heart failure with preserved ejection fraction  Plan continue Lasix 40 mg daily   Iron deficiency anemia due to chronic blood loss HPI: He recently had a prolonged hospitalization due to GI bleeding.  He underwent a thorough work-up for this with multiple AVMs being ablated.  GI still suspects he may have additional foci of AVMs that are accessible from endoscopy and that he may continue to have some slight bleeding.  In his hospitalization he was given a dose of IV Feraheme and a CODE BLUE  was called due to a loss of consciousness.  He underwent brief CPR.  He has recovered from this well.    A: IDA  P: He is currently on oral iron supplementation.  GI has been following with him since the hospitalization.  Plan will be to continue oral iron supplementation if his iron stores are not improving we will seek consultation from hematology about other potential parenternal iron supplementation.  Long term prescription opiate use HPI: Patient receives Percocet 10-3 25 1  tablet every 8 hours as needed for pain and has been provided 75 tablets/month.  His  chronic pain generator is his bilateral osteoarthritis of his knees as well as his back.  He reports this medication helps him to tolerate bad days he denies any illicit substance use.  Assessment long-term use of opioid analgesic  Plan This is my first visit with [name] my review of his chart seems that he has been on opioid pain medications dating back to at least 2012 (maybe longer).  I only see a note of a inappropriate UDS from 2012 that was a red flag.  In reviewing his most recent urine tox it does not appear oxycodone was present however [name] also reports that he does not take the medication necessarily every day.  Overall my plan is to continue this medication for now with close monitoring at the next visit I plan to obtain a urine tox screen.  I have reviewed the prescribing database and it is appropriate.   Medications Ordered No orders of the defined types were placed in this encounter.  Other Orders No orders of the defined types were placed in this encounter.  Follow Up: Return in about 3 months (around 03/14/2019).

## 2018-12-18 DIAGNOSIS — Z79891 Long term (current) use of opiate analgesic: Secondary | ICD-10-CM | POA: Insufficient documentation

## 2018-12-18 NOTE — Assessment & Plan Note (Signed)
HPI: He recently had a prolonged hospitalization due to GI bleeding.  He underwent a thorough work-up for this with multiple AVMs being ablated.  GI still suspects he may have additional foci of AVMs that are accessible from endoscopy and that he may continue to have some slight bleeding.  In his hospitalization he was given a dose of IV Feraheme and a CODE BLUE was called due to a loss of consciousness.  He underwent brief CPR.  He has recovered from this well.    A: IDA  P: He is currently on oral iron supplementation.  GI has been following with him since the hospitalization.  Plan will be to continue oral iron supplementation if his iron stores are not improving we will seek consultation from hematology about other potential parenternal iron supplementation.

## 2018-12-18 NOTE — Assessment & Plan Note (Signed)
HPI: Chief complaint this morning is of lower extremity swelling.  He reports that his swelling is worse after he is on his feet for a few hours.  Typically is better in the morning when he first gets out of bed.  He notes that he takes a fluid pill (Lasix) and he feels that he has been urinating fine.  He reports to me that he has tried some compression stockings in the past he still has them at home however he has not used them in the past few years.  Assessment venous insufficiency  Plan Had extensive discussion with him about the nature of lower extremity edema I will have him continue his Lasix however I think that much of this is chronic venous insufficiency and really needs to be treated with compression therapy.  I discussed with him using medical grade compression stockings he reports he already has these at home I discussed with him appropriate skin care while using this.  He does have a history of DVT he is now off anticoagulation I do not suspect DVT as a cause of his bilateral lower extremity edema that is variable throughout the day.

## 2018-12-18 NOTE — Assessment & Plan Note (Addendum)
HPI: Patient receives Percocet 10-3 25 1  tablet every 8 hours as needed for pain and has been provided 75 tablets/month.  His chronic pain generator is his bilateral osteoarthritis of his knees as well as his back.  He reports this medication helps him to tolerate bad days he denies any illicit substance use.  Assessment long-term use of opioid analgesic  Plan This is my first visit with [name] my review of his chart seems that he has been on opioid pain medications dating back to at least 2012 (maybe longer).  I only see a note of a inappropriate UDS from 2012 that was a red flag.  In reviewing his most recent urine tox it does not appear oxycodone was present however [name] also reports that he does not take the medication necessarily every day.  Overall my plan is to continue this medication for now with close monitoring at the next visit I plan to obtain a urine tox screen.  I have reviewed the prescribing database and it is appropriate.

## 2018-12-18 NOTE — Assessment & Plan Note (Signed)
HPI: He reports to me that he is doing well he denies any orthopnea he does get a little short of breath with prolonged walking.  He has been taking his Lasix as directed.  Assessment chronic heart failure with preserved ejection fraction  Plan continue Lasix 40 mg daily

## 2018-12-18 NOTE — Assessment & Plan Note (Signed)
HPI: No issues, taking medications as prescribed  A: Essential HTN well controlled  P: Continue Verapamil 180mg  daily

## 2019-01-03 ENCOUNTER — Other Ambulatory Visit: Payer: Self-pay

## 2019-01-07 ENCOUNTER — Encounter: Payer: Self-pay | Admitting: Podiatry

## 2019-01-07 ENCOUNTER — Ambulatory Visit (INDEPENDENT_AMBULATORY_CARE_PROVIDER_SITE_OTHER): Payer: Medicare Other | Admitting: Podiatry

## 2019-01-07 ENCOUNTER — Other Ambulatory Visit: Payer: Self-pay

## 2019-01-07 DIAGNOSIS — Q828 Other specified congenital malformations of skin: Secondary | ICD-10-CM | POA: Diagnosis not present

## 2019-01-07 DIAGNOSIS — B351 Tinea unguium: Secondary | ICD-10-CM | POA: Diagnosis not present

## 2019-01-07 DIAGNOSIS — M79675 Pain in left toe(s): Secondary | ICD-10-CM | POA: Diagnosis not present

## 2019-01-07 DIAGNOSIS — M79674 Pain in right toe(s): Secondary | ICD-10-CM

## 2019-01-07 DIAGNOSIS — D689 Coagulation defect, unspecified: Secondary | ICD-10-CM

## 2019-01-07 NOTE — Progress Notes (Signed)
Complaint:  Visit Type: Patient returns to my office for continued preventative foot care services. Complaint: Patient states" my nails have grown long and thick and become painful to walk and wear shoes".  Patient has multiple calluses both feet. Patient has been diagnosed with DM with no foot complications. The patient presents for preventative foot care services. No changes to ROS  Podiatric Exam: Vascular: dorsalis pedis and posterior tibial pulses are weakly palpable bilateral. Capillary return is immediate. Temperature gradient is WNL. Skin turgor WNL  Sensorium: Normal Semmes Weinstein monofilament test. Normal tactile sensation bilaterally. Nail Exam: Pt has thick disfigured discolored nails with subungual debris noted bilateral entire nail hallux through fifth toenails Ulcer Exam: There is no evidence of ulcer or pre-ulcerative changes or infection. Orthopedic Exam: Muscle tone and strength are WNL. No limitations in general ROM. No crepitus or effusions noted. Foot type and digits show no abnormalities. Bony prominences are unremarkable. Pes planus foot type. Skin: . No infection or ulcers.  Multiple porokeratosis  Greater than 5.  Diagnosis:  Onychomycosis, , Pain in right toe, pain in left toes  Porokeratosis   Treatment & Plan Procedures and Treatment: Consent by patient was obtained for treatment procedures.   Debridement of mycotic and hypertrophic toenails, 1 through 5 bilateral and clearing of subungual debris. No ulceration, no infection noted. Debride porokeratosis  Return Visit-Office Procedure: Patient instructed to return to the office for a follow up visit 12 weeks for continued evaluation and treatment.    Gardiner Barefoot DPM

## 2019-01-10 ENCOUNTER — Telehealth: Payer: Self-pay | Admitting: Gastroenterology

## 2019-01-10 NOTE — Telephone Encounter (Signed)
Patient's caregiver is advised that the lab is a follow up from June and to come at their convenience to the lab

## 2019-01-10 NOTE — Telephone Encounter (Signed)
Ms.Davis was calling in on behalf of patient he was present to ask the nurse if the lab work that the doctor wanted him to have is urgent. Please call Ms.Davis back [phone] and advise thanks.

## 2019-01-14 ENCOUNTER — Telehealth: Payer: Self-pay | Admitting: *Deleted

## 2019-01-14 NOTE — Telephone Encounter (Signed)
Received fax from [facility] requesting refill on ibuprofen 800 mg. This is no longer on current med list. [name], RN, BSN

## 2019-01-15 NOTE — Telephone Encounter (Signed)
His Ibuprofen was stopped during his hospital stay for GI bleeding, I would like him to hold off this medication for now

## 2019-01-15 NOTE — Telephone Encounter (Signed)
Called pharm, informed the pharmacist of reason, she made note of it

## 2019-01-16 ENCOUNTER — Other Ambulatory Visit (INDEPENDENT_AMBULATORY_CARE_PROVIDER_SITE_OTHER): Payer: Medicare Other

## 2019-01-16 DIAGNOSIS — D500 Iron deficiency anemia secondary to blood loss (chronic): Secondary | ICD-10-CM

## 2019-01-16 LAB — CBC
HCT: 43.2 % (ref 39.0–52.0)
Hemoglobin: 13.3 g/dL (ref 13.0–17.0)
MCH: 27 pg (ref 26.0–34.0)
MCHC: 30.8 g/dL (ref 30.0–36.0)
MCV: 87.6 fL (ref 80.0–100.0)
Platelets: 218 10*3/uL (ref 150–400)
RBC: 4.93 MIL/uL (ref 4.22–5.81)
RDW: 26.7 % — ABNORMAL HIGH (ref 11.5–15.5)
WBC: 4.6 10*3/uL (ref 4.0–10.5)
nRBC: 0 % (ref 0.0–0.2)

## 2019-01-17 ENCOUNTER — Other Ambulatory Visit: Payer: Self-pay | Admitting: Internal Medicine

## 2019-01-17 DIAGNOSIS — M1711 Unilateral primary osteoarthritis, right knee: Secondary | ICD-10-CM

## 2019-01-17 MED ORDER — OXYCODONE-ACETAMINOPHEN 10-325 MG PO TABS: 1.0000 | ORAL_TABLET | Freq: Three times a day (TID) | ORAL | 0 refills | Status: DC | PRN

## 2019-01-17 NOTE — Telephone Encounter (Signed)
Attempted call to [facility] no answer. If he calls back let him know his blood counts (anemia) has improved, he should continue taking the iron pill (no changes to medications)

## 2019-01-17 NOTE — Telephone Encounter (Signed)
Pt informed of lab results and requests refill on "pain meds".  Will sent to pcp for consideration.Despina Hidden Cassady7/24/202010:37 AM

## 2019-01-20 NOTE — Addendum Note (Signed)
Addended by: Truddie Crumble on: 01/20/2019 03:44 PM   Modules accepted: Orders

## 2019-01-22 ENCOUNTER — Other Ambulatory Visit: Payer: Self-pay | Admitting: *Deleted

## 2019-01-22 MED ORDER — PANTOPRAZOLE SODIUM 40 MG PO TBEC: 40.0000 mg | DELAYED_RELEASE_TABLET | Freq: Two times a day (BID) | ORAL | 1 refills | Status: DC

## 2019-01-28 ENCOUNTER — Other Ambulatory Visit: Payer: Self-pay | Admitting: *Deleted

## 2019-01-28 DIAGNOSIS — I10 Essential (primary) hypertension: Secondary | ICD-10-CM

## 2019-01-29 MED ORDER — VERAPAMIL HCL ER 180 MG PO TBCR: 180.0000 mg | EXTENDED_RELEASE_TABLET | Freq: Every day | ORAL | 3 refills | Status: DC

## 2019-02-13 ENCOUNTER — Ambulatory Visit (INDEPENDENT_AMBULATORY_CARE_PROVIDER_SITE_OTHER): Payer: Medicare Other | Admitting: Internal Medicine

## 2019-02-13 ENCOUNTER — Encounter: Payer: Self-pay | Admitting: Internal Medicine

## 2019-02-13 ENCOUNTER — Other Ambulatory Visit: Payer: Self-pay

## 2019-02-13 VITALS — BP 124/62 | HR 75 | Temp 98.6°F | Wt 254.1 lb

## 2019-02-13 DIAGNOSIS — M1711 Unilateral primary osteoarthritis, right knee: Secondary | ICD-10-CM

## 2019-02-13 DIAGNOSIS — Z9181 History of falling: Secondary | ICD-10-CM | POA: Diagnosis not present

## 2019-02-13 LAB — SYNOVIAL CELL COUNT + DIFF, W/ CRYSTALS
Crystals, Fluid: NONE SEEN
Eosinophils-Synovial: 0 % (ref 0–1)
Lymphocytes-Synovial Fld: 12 % (ref 0–20)
Monocyte-Macrophage-Synovial Fluid: 79 % (ref 50–90)
Neutrophil, Synovial: 9 % (ref 0–25)
WBC, Synovial: 120 /mm3 (ref 0–200)

## 2019-02-13 NOTE — Assessment & Plan Note (Addendum)
Presents for 1 month history of worsening pain in his right knee.  He said that the pain has progressed over the last month.  The pain is worse first thing in the morning and makes it difficult to get out of bed.  He states that once he gets up and walking, the pain slowly subsides but never goes away.  Currently using Percocet 10 mg as follows - 2 tablets in the morning, 2 tablets at midday, and 2 tablets at night for pain from osteoarthritis of the back and knee.  He states that the pain medication does help relieve his right knee pain but it still interferes with his daily activities.  He admits to at least one fall last week that was due to inability to bear weight on his right knee because of the pain. He denies direct trauma to the knee.   He has a significant history of osteoarthritis in his right knee.  X-rays from 2015 shows degenerative changes with chondrocalcinosis.  During today's visit, the patient admits to point tenderness over the medial aspect of the joint space.  It is difficult to discern whether or not his knee is swollen or if his leg is swollen from lower extremity venous insufficiency.  The pain does not limit his range of motion.  There is no joint instability or ligament laxity.  Patient denies recent fever or chills.  The skin surrounding the joint is warm and dry without evidence of lesions today. An ultrasound was performed today, which showed a moderate joint effusion.  Arthrocentesis was performed and 30 cc of joint fluid was aspirated.  A corticosteroid injection was administered successfully.    Plan: -Arthrocentesis with synovial fluid cell count with crystals. - Administered corticosteroid injection to the right knee joint space. - Informed the patient to follow-up with our clinic if he continues to have pain in his right knee.

## 2019-02-13 NOTE — Patient Instructions (Signed)
Thank you, ShawnShawn [name] for allowing [place] to provide your care today. Today we discussed osteoarthritis of right knee.  Today we remove fluid from your right knee and injected a steroid to reduce your pain.  I have ordered analysis of your joint fluid labs for you. I will call if any are abnormal.    I have place a referrals to None   I have ordered the following tests: We did an ultrasound of your right knee today and found a moderate effusion (fluid)  We changes the following medications: No changes today  Please follow-up: as needed for pain in your knee..    Should you have any questions or concerns please call the internal medicine clinic at [phone].    [name], D.O. [facility]

## 2019-02-13 NOTE — Progress Notes (Signed)
CC: Right knee pain  HPI:  [name] is a 73 y.o. male with a past medical history stated bellow and presents today with a one month history of progressive right knee pain. Please see problem based assessment and plan for additional details.   Past Medical History:  Diagnosis Date   Achalasia 05/05/2013   Esophageal manometry demonstrated an elevated resting LES an incomplete relaxation but normal peristalsis.  Excellent symptomatic response to LES Botox injections.    Anemia 08/20/2018   AVM (arteriovenous malformation) of colon 08/26/2013   Cecum X 3, without bleeding on colonoscopy February 2015    B12 deficiency 10/01/2011   Discovered to 2 progressive neurologic pain and dysfunction.  Diagnosis established March 2013.  Requires the following treatment: B12 IM monthly until level normalizes. After that, oral supplementation.    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  Complicated by chronic low back pain and neuropathy.  Nerve conduction study (10/19/11): Diffusely low motor amplitudes, with primarily involvement of the peroneal and posterior tibial nerves. No evidence of generalized peripheral neuropathy. Findings could be c/w bil multilevel lumbosacral radiculopathies or a  primary motor neuropathy.    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  Respiratory: Negative for shortness of breath.   Cardiovascular: Positive for leg swelling. Negative for chest pain.  Musculoskeletal: Positive for falls (1 fall last week due to knee pain) and joint pain (right knee).       Joint swelling  Skin: Negative for rash.  Neurological: Negative for tingling, sensory change, focal weakness and weakness.     Vitals:   02/13/19 0955  BP: 124/62  Pulse: 75  Temp: 98.6 F (37 C)  TempSrc: Oral  SpO2: 100%  Weight: 254 lb 1.6 oz (115.3 kg)     Physical Exam: Physical Exam  Constitutional: No distress.  Cardiovascular: Normal rate, regular rhythm and intact distal pulses.  Musculoskeletal:     Right knee: He exhibits swelling, effusion and deformity. He exhibits normal range of motion, no ecchymosis, normal alignment, no LCL laxity, normal patellar mobility, normal meniscus and no MCL laxity. Tenderness found. Medial joint line tenderness noted. No lateral joint line tenderness noted.  Skin: He is not diaphoretic.     Assessment & Plan:   See Encounters Tab for problem based charting.  Patient seen with Dr. Evette Doffing

## 2019-02-13 NOTE — Progress Notes (Signed)
Internal Medicine Clinic Attending  I saw and evaluated the patient.  I personally confirmed the key portions of the history and exam documented by Dr. Marianna Payment and I reviewed pertinent patient test results.  The assessment, diagnosis, and plan were formulated together and I agree with the documentation in the resident's note.  Knee Arthrocentesis with Injection Procedure Note  Diagnosis: right knee osteoarthritis  Indications: Symptomatic relief of large effusion and Symptom relief from osteoarthritis  Anesthesia: Lidocaine 1% without epinephrine  Procedure Details   Point of care ultrasound was used to identify the joint effusion and plan needle trajectory and depth. Consent was obtained for the procedure. The joint was prepped with Betadine. A 18 gauge needle was inserted into the superior aspect of the joint from a medial approach to access the suprapatellar pouch. 30 ml of clear yellow fluid was removed from the joint and sent to the lab for analysis. 2 ml 1% lidocaine and 40 mg of Triamcinolone was then injected into the joint through the same needle. The needle was removed and the area cleansed and dressed.  Complications:  None; patient tolerated the procedure well.

## 2019-02-17 ENCOUNTER — Other Ambulatory Visit: Payer: Self-pay | Admitting: *Deleted

## 2019-02-17 DIAGNOSIS — M1711 Unilateral primary osteoarthritis, right knee: Secondary | ICD-10-CM

## 2019-02-17 MED ORDER — OXYCODONE-ACETAMINOPHEN 10-325 MG PO TABS: 1.0000 | ORAL_TABLET | Freq: Three times a day (TID) | ORAL | 0 refills | Status: DC | PRN

## 2019-02-17 NOTE — Telephone Encounter (Signed)
Last rx written  01/17/19. Last OV 02/13/19. Next OV  04/03/19. UDS  08/28/17.

## 2019-02-27 ENCOUNTER — Other Ambulatory Visit: Payer: Self-pay | Admitting: Gastroenterology

## 2019-02-27 DIAGNOSIS — D500 Iron deficiency anemia secondary to blood loss (chronic): Secondary | ICD-10-CM

## 2019-02-27 NOTE — Telephone Encounter (Signed)
Incoming request for iron 325mg  1 a day. Should he continue the iron? Yes, per Danis. Refills have been given

## 2019-03-10 ENCOUNTER — Telehealth: Payer: Self-pay | Admitting: Internal Medicine

## 2019-03-10 NOTE — Telephone Encounter (Signed)
Pt requesting a new Rollator Walker be sent to [facility] for a [employer].

## 2019-03-11 ENCOUNTER — Ambulatory Visit (INDEPENDENT_AMBULATORY_CARE_PROVIDER_SITE_OTHER): Payer: Medicare Other | Admitting: Internal Medicine

## 2019-03-11 ENCOUNTER — Other Ambulatory Visit: Payer: Self-pay

## 2019-03-11 DIAGNOSIS — R011 Cardiac murmur, unspecified: Secondary | ICD-10-CM | POA: Diagnosis not present

## 2019-03-11 DIAGNOSIS — L03116 Cellulitis of left lower limb: Secondary | ICD-10-CM

## 2019-03-11 DIAGNOSIS — S91302A Unspecified open wound, left foot, initial encounter: Secondary | ICD-10-CM | POA: Diagnosis not present

## 2019-03-11 DIAGNOSIS — Z87891 Personal history of nicotine dependence: Secondary | ICD-10-CM

## 2019-03-11 DIAGNOSIS — L089 Local infection of the skin and subcutaneous tissue, unspecified: Secondary | ICD-10-CM

## 2019-03-11 MED ORDER — AMOXICILLIN 875 MG PO TABS: 875.0000 mg | ORAL_TABLET | Freq: Two times a day (BID) | ORAL | 0 refills | Status: DC

## 2019-03-11 MED ORDER — DOXYCYCLINE HYCLATE 100 MG PO TABS: 100.0000 mg | ORAL_TABLET | Freq: Two times a day (BID) | ORAL | 0 refills | Status: DC

## 2019-03-11 NOTE — Patient Instructions (Addendum)
Mr. Boros I have sent some antibiotics to your pharmacy that you should begin taking today.  Please take until course is complete.  I have ordered an MRI to evaluate your foot further.  Also, we will arrange for you to see your podiatrist sooner.  Please return in one week to check on your wound.  Please use the dressings we have provided to care for your wound.

## 2019-03-11 NOTE — Progress Notes (Signed)
CC: left foot infection  HPI:  [name] is a 73 y.o. male with PMH below.  Today we will address  left foot infection  Please see A&P for status of the patient's chronic medical conditions  Past Medical History:  Diagnosis Date  . 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  Excellent symptomatic response to LES Botox injections.   . Anemia 08/20/2018  . AVM (arteriovenous malformation) of colon 08/26/2013   Cecum X 3, without bleeding on colonoscopy February 2015   . B12 deficiency 10/01/2011   Discovered to 2 progressive neurologic pain and dysfunction.  Diagnosis established March 2013.  Requires the following treatment: B12 IM monthly until level normalizes. After that, oral supplementation.   . Cataract   . Chronic venous insufficiency 11/28/2013  . Constipation due to opioid therapy 03/11/2015  . Deep venous thrombosis ([place]) 05/31/2018   Right lower extremity, unprovoked  . Diverticulosis 08/26/2013  . Essential hypertension 06/15/2006  . Family history of malignant neoplasm of gastrointestinal tract 06/30/2013   Father with colon cancer age 47   . Gastroesophageal reflux disease 02/04/2007  . GI bleeding 08/21/2018  . Internal hemorrhoids 08/26/2013  . Left posterior subcapsular cataract 10/03/2013   s/p yag laser capsulotomy 10/03/2013   . 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  Complicated by chronic low back pain and neuropathy.  Nerve conduction study (10/19/11): Diffusely low motor amplitudes, with primarily involvement of the peroneal and posterior tibial nerves. No evidence of generalized peripheral neuropathy. Findings could be c/w bil multilevel lumbosacral radiculopathies or a primary motor neuropathy.   . Mild aortic valve stenosis  10/09/2006   Echo (12/09/2009): Valve area: 2.03 cm2   . Neuropathy   . Obesity, Class I, BMI 30-34.9 04/05/2012  . Onychomycosis of toenail 06/05/2014  . Osteoarthritis 11/25/2009   Right knee (medial compartment), right hip, left shoulder   . 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 Trigger little finger of right hand 03/11/2015   Review of Systems:  ROS: Pulmonary: pt denies increased work of breathing, shortness of breath,  Cardiac: pt denies palpitations, chest pain,  Abdominal: pt denies abdominal pain, nausea, vomiting, or diarrhea  Physical Exam:  Vitals:   03/11/19 1345  BP: 127/67  Pulse: 73  Temp: 98.5 F (36.9 C)  TempSrc: Oral  SpO2: 100%  Weight: 254 lb 12.8 oz (115.6 kg)  Height: 6\' 2"  (1.88 m)   Cardiovascular: JVD flat, normal rate and rhythm, clear s1 and s2, 3/6 systolic murmur, no rubs or gallops, trace LE edema bilaterally, bounding DP/PT pulses bilaterally Pulmonary: CTAB, not in distress Abdominal: non distended abdomen, soft and nontender Psych: Alert, conversant, in good spirits Left foot wound: small circumferential wound draining pus on mid lateral foot Media Information    Document Information  Photos    03/11/2019 14:11  Attached To:  Office Visit on 03/11/19 with Shawn Roan, MD  Source Information  [name], Jenne Pane, MD  [facility]      Social History   Socioeconomic History  . Marital status: Single    Spouse name: Not on file  . Number of children: Not on file  . Years of education: Not on file  . Highest education level: Not  on file  Occupational History  . Not on file  Social Needs  . Financial resource strain: Not on file  . Food insecurity    Worry: Not on file    Inability: Not on file  . Transportation needs    Medical: Not on file    Non-medical: Not on file  Tobacco Use  . Smoking status: Former Smoker    Packs/day: 0.50    Years: 30.00     Pack years: 15.00    Types: Cigarettes    Quit date: 11/05/2010    Years since quitting: 8.3  . Smokeless tobacco: Never Used  Substance and Sexual Activity  . Alcohol use: Yes    Alcohol/week: 0.0 standard drinks    Comment: every now and then  . Drug use: No  . Sexual activity: Never    Comment: quit in 2014  Lifestyle  . Physical activity    Days per week: Not on file    Minutes per session: Not on file  . Stress: Not on file  Relationships  . Social Herbalist on phone: Not on file    Gets together: Not on file    Attends religious service: Not on file    Active member of club or organization: Not on file    Attends meetings of clubs or organizations: Not on file    Relationship status: Not on file  . Intimate partner violence    Fear of current or ex partner: Not on file    Emotionally abused: Not on file    Physically abused: Not on file    Forced sexual activity: Not on file  Other Topics Concern  . Not on file  Social History Narrative  . Not on file   Family History  Problem Relation Age of Onset  . Heart disease Mother   . Colon cancer Father 85  . Kidney disease Brother        on dialysis  . Pancreatic cancer Brother   . Hypertension Sister   . Diabetes Sister   . Kidney disease Brother        on dialysis  . Diabetes Sister   . Colon cancer Brother        2 brothers with colon cancer  . Cancer Daughter 33       Died at age of 4, unknown type  . Anesthesia problems Neg Hx     Assessment & Plan:   See Encounters Tab for problem based charting.  Patient discussed with Dr. Lynnae January

## 2019-03-12 LAB — CBC WITH DIFFERENTIAL/PLATELET
Basophils Absolute: 0 10*3/uL (ref 0.0–0.2)
Basos: 0 %
EOS (ABSOLUTE): 0.1 10*3/uL (ref 0.0–0.4)
Eos: 1 %
Hematocrit: 46.4 % (ref 37.5–51.0)
Hemoglobin: 15.6 g/dL (ref 13.0–17.7)
Immature Grans (Abs): 0 10*3/uL (ref 0.0–0.1)
Immature Granulocytes: 0 %
Lymphocytes Absolute: 1.3 10*3/uL (ref 0.7–3.1)
Lymphs: 22 %
MCH: 29.8 pg (ref 26.6–33.0)
MCHC: 33.6 g/dL (ref 31.5–35.7)
MCV: 89 fL (ref 79–97)
Monocytes Absolute: 0.7 10*3/uL (ref 0.1–0.9)
Monocytes: 12 %
Neutrophils Absolute: 3.7 10*3/uL (ref 1.4–7.0)
Neutrophils: 65 %
Platelets: 126 10*3/uL — ABNORMAL LOW (ref 150–450)
RBC: 5.23 x10E6/uL (ref 4.14–5.80)
RDW: 16.5 % — ABNORMAL HIGH (ref 11.6–15.4)
WBC: 5.7 10*3/uL (ref 3.4–10.8)

## 2019-03-12 LAB — C-REACTIVE PROTEIN: CRP: 59 mg/L — ABNORMAL HIGH (ref 0–10)

## 2019-03-12 NOTE — Telephone Encounter (Signed)
Attempted to reach patient to inform him of below. No answer and no VM box set up. Will make second attempt later. [name], RN, BSN

## 2019-03-12 NOTE — Telephone Encounter (Signed)
Community message sent to [name] at [facility] asking if they have this particular brand of rolator, and if insurance will cover it. Patient was seen in [facility] yesterday but need for new rolator was not addressed. He has f/u [facility] appt on 03/18/2019 and need for rolator can be addressed at that time. [name], RN, BSN

## 2019-03-12 NOTE — Telephone Encounter (Signed)
[place], Germain Osgood, Orvis Brill, RN; [place], Stanford Breed, [place]; [place], [place] C, [place]          We do carry the Nitro's, but insurance will not cover them. They are private pay only. $299.99. The patient can go into either the [facility] location or the [facility]  in [place].

## 2019-03-12 NOTE — Assessment & Plan Note (Addendum)
Started as a scab which he scratched off last week.  Now foot is painful and edematous with surrounding cellulitis.  No systemic symptoms.  Almost resembles a puncture wound but pt denies this, shoes seem appropriate no rough spot or wear in that area.  He has good blood flow on exam and normal ABI's in 2013. There is a red line above and below the wound that is concerning for tracking of infection.  Was not able to probe to bone.  LE edema has improved overall compared to his last visit here based on chart review.  It will be important to keep fluid off to help infection heal.    -cbc, crp -MRI to further evaluate extent of infection  -irrigated area with saline and collected wound culture -start on doxy and amoxicillin x 1 week -Ivin Booty was able to give pt dressings and wound care instructions -moved podiatry appointment up to Friday as he could benefit from debridement and their opinion

## 2019-03-14 ENCOUNTER — Encounter: Payer: Self-pay | Admitting: *Deleted

## 2019-03-14 ENCOUNTER — Encounter: Payer: Self-pay | Admitting: Podiatry

## 2019-03-14 ENCOUNTER — Telehealth: Payer: Self-pay | Admitting: *Deleted

## 2019-03-14 ENCOUNTER — Other Ambulatory Visit: Payer: Self-pay

## 2019-03-14 ENCOUNTER — Ambulatory Visit (INDEPENDENT_AMBULATORY_CARE_PROVIDER_SITE_OTHER): Payer: Medicare Other | Admitting: Podiatry

## 2019-03-14 ENCOUNTER — Ambulatory Visit (INDEPENDENT_AMBULATORY_CARE_PROVIDER_SITE_OTHER): Payer: Medicare Other

## 2019-03-14 ENCOUNTER — Other Ambulatory Visit: Payer: Self-pay | Admitting: Podiatry

## 2019-03-14 VITALS — BP 144/81 | HR 74 | Temp 96.3°F | Resp 16

## 2019-03-14 DIAGNOSIS — Q828 Other specified congenital malformations of skin: Secondary | ICD-10-CM

## 2019-03-14 DIAGNOSIS — M779 Enthesopathy, unspecified: Secondary | ICD-10-CM

## 2019-03-14 DIAGNOSIS — L97322 Non-pressure chronic ulcer of left ankle with fat layer exposed: Secondary | ICD-10-CM

## 2019-03-14 DIAGNOSIS — B351 Tinea unguium: Secondary | ICD-10-CM | POA: Diagnosis not present

## 2019-03-14 DIAGNOSIS — E1169 Type 2 diabetes mellitus with other specified complication: Secondary | ICD-10-CM | POA: Diagnosis not present

## 2019-03-14 DIAGNOSIS — M778 Other enthesopathies, not elsewhere classified: Secondary | ICD-10-CM

## 2019-03-14 DIAGNOSIS — M79672 Pain in left foot: Secondary | ICD-10-CM

## 2019-03-14 DIAGNOSIS — E08622 Diabetes mellitus due to underlying condition with other skin ulcer: Secondary | ICD-10-CM | POA: Diagnosis not present

## 2019-03-14 DIAGNOSIS — E1142 Type 2 diabetes mellitus with diabetic polyneuropathy: Secondary | ICD-10-CM

## 2019-03-14 LAB — WOUND CULTURE

## 2019-03-14 NOTE — Telephone Encounter (Addendum)
Required form, demographics and orders faxed to [facility], and will fax 03/14/2019 when available, to [facility].

## 2019-03-14 NOTE — Telephone Encounter (Signed)
-----   Message from Evelina Bucy, DPM sent at 03/14/2019  9:39 AM EDT ----- [facility] - left foot wound - Cleanse weekly Prisma and DSD twice weekly

## 2019-03-14 NOTE — Telephone Encounter (Signed)
No answer at patient's number and no VM set up. Reached caregiver at [phone]. Gave info below. She will relay to patient. [name], RN, BSN

## 2019-03-17 ENCOUNTER — Other Ambulatory Visit: Payer: Self-pay

## 2019-03-17 ENCOUNTER — Ambulatory Visit (HOSPITAL_COMMUNITY): Admission: RE | Admit: 2019-03-17 | Payer: Medicare Other | Source: Ambulatory Visit

## 2019-03-17 NOTE — Progress Notes (Signed)
Internal Medicine Clinic Attending  Case discussed with Dr. Winfrey  at the time of the visit.  We reviewed the resident's history and exam and pertinent patient test results.  I agree with the assessment, diagnosis, and plan of care documented in the resident's note.  

## 2019-03-17 NOTE — Telephone Encounter (Signed)
rtc to pt, a lady spoke and was not pleasant, informed her and pt when the med was stopped and that it had been discussed with pt.

## 2019-03-17 NOTE — Telephone Encounter (Signed)
Pt would like to know if he supposed to take the Xarelto, please call pt back.

## 2019-03-17 NOTE — Telephone Encounter (Signed)
Correct. Xarelto was stopped after his last hospitalization for GI bleed. He has follow up scheduled 9/23, we can readdress ongoing use then. Thanks.

## 2019-03-17 NOTE — Telephone Encounter (Signed)
[place] form, clinical 01/05/2019 and demographics faxed.

## 2019-03-17 NOTE — Telephone Encounter (Signed)
Please confirm that this med has been discontinued for pt, march at disch?

## 2019-03-17 NOTE — Telephone Encounter (Signed)
[facility] states pt is out of network.

## 2019-03-18 ENCOUNTER — Ambulatory Visit: Payer: Medicare Other

## 2019-03-18 ENCOUNTER — Telehealth: Payer: Self-pay | Admitting: *Deleted

## 2019-03-18 ENCOUNTER — Other Ambulatory Visit: Payer: Self-pay | Admitting: Podiatry

## 2019-03-18 DIAGNOSIS — M778 Other enthesopathies, not elsewhere classified: Secondary | ICD-10-CM

## 2019-03-18 NOTE — Telephone Encounter (Signed)
Pt's representative calls and ask for a rollator walker, he had one but it broke about 2 months, Nitro rollator would work the best. [facility] does not take [employer] so can we see if we can help

## 2019-03-19 ENCOUNTER — Ambulatory Visit (INDEPENDENT_AMBULATORY_CARE_PROVIDER_SITE_OTHER): Payer: Medicare Other | Admitting: Internal Medicine

## 2019-03-19 ENCOUNTER — Ambulatory Visit (HOSPITAL_COMMUNITY)
Admission: RE | Admit: 2019-03-19 | Discharge: 2019-03-19 | Disposition: A | Discharge status: Home / Self Care | Payer: Medicare Other | Source: Ambulatory Visit | Attending: Internal Medicine | Admitting: Internal Medicine

## 2019-03-19 ENCOUNTER — Other Ambulatory Visit: Payer: Self-pay

## 2019-03-19 DIAGNOSIS — Z87891 Personal history of nicotine dependence: Secondary | ICD-10-CM

## 2019-03-19 DIAGNOSIS — L089 Local infection of the skin and subcutaneous tissue, unspecified: Secondary | ICD-10-CM | POA: Insufficient documentation

## 2019-03-19 DIAGNOSIS — Z792 Long term (current) use of antibiotics: Secondary | ICD-10-CM | POA: Diagnosis not present

## 2019-03-19 DIAGNOSIS — L97529 Non-pressure chronic ulcer of other part of left foot with unspecified severity: Secondary | ICD-10-CM | POA: Diagnosis not present

## 2019-03-19 LAB — CREATININE, SERUM
Creatinine, Ser: 1.16 mg/dL (ref 0.61–1.24)
GFR calc Af Amer: >60 mL/min (ref >60)
GFR calc non Af Amer: >60 mL/min (ref >60)

## 2019-03-19 MED ORDER — GADOBUTROL 1 MMOL/ML IV SOLN
9.0000 mL | Freq: Once | INTRAVENOUS | Status: AC | PRN
  Administered 2019-03-19: 12:00:00 9 mL via INTRAVENOUS

## 2019-03-19 MED ORDER — AMOXICILLIN 875 MG PO TABS: 875.0000 mg | ORAL_TABLET | Freq: Two times a day (BID) | ORAL | 0 refills | Status: DC

## 2019-03-19 MED ORDER — DOXYCYCLINE HYCLATE 100 MG PO TABS: 100.0000 mg | ORAL_TABLET | Freq: Two times a day (BID) | ORAL | 0 refills | Status: DC

## 2019-03-19 NOTE — Assessment & Plan Note (Signed)
Appears to have worsened somewhat, still infected.  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    -I will await official read.  For now will prescribe another week of abx therapy -continue home health wound care

## 2019-03-19 NOTE — Progress Notes (Signed)
CC: left foot infection   HPI:  [name] is a 73 y.o. male with PMH below.  Today we will address left foot infection   Please see A&P for status of the patient's chronic medical conditions  Past Medical History:  Diagnosis Date  . 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  Excellent symptomatic response to LES Botox injections.   . Anemia 08/20/2018  . AVM (arteriovenous malformation) of colon 08/26/2013   Cecum X 3, without bleeding on colonoscopy February 2015   . B12 deficiency 10/01/2011   Discovered to 2 progressive neurologic pain and dysfunction.  Diagnosis established March 2013.  Requires the following treatment: B12 IM monthly until level normalizes. After that, oral supplementation.   . Cataract   . Chronic venous insufficiency 11/28/2013  . Constipation due to opioid therapy 03/11/2015  . Deep venous thrombosis ([place]) 05/31/2018   Right lower extremity, unprovoked  . Diverticulosis 08/26/2013  . Essential hypertension 06/15/2006  . Family history of malignant neoplasm of gastrointestinal tract 06/30/2013   Father with colon cancer age 10   . Gastroesophageal reflux disease 02/04/2007  . GI bleeding 08/21/2018  . Internal hemorrhoids 08/26/2013  . Left posterior subcapsular cataract 10/03/2013   s/p yag laser capsulotomy 10/03/2013   . 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  Complicated by chronic low back pain and neuropathy.  Nerve conduction study (10/19/11): Diffusely low motor amplitudes, with primarily involvement of the peroneal and posterior tibial nerves. No evidence of generalized peripheral neuropathy. Findings could be c/w bil multilevel lumbosacral radiculopathies or a primary motor neuropathy.   . Mild aortic valve stenosis  10/09/2006   Echo (12/09/2009): Valve area: 2.03 cm2   . Neuropathy   . Obesity, Class I, BMI 30-34.9 04/05/2012  . Onychomycosis of toenail 06/05/2014  . Osteoarthritis 11/25/2009   Right knee (medial compartment), right hip, left shoulder   . 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 Trigger little finger of right hand 03/11/2015   Review of Systems:  ROS: Pulmonary: pt denies increased work of breathing, shortness of breath,  Cardiac: pt denies palpitations, chest pain,   Physical Exam:  Vitals:   03/19/19 1148  BP: 135/61  Pulse: 66  Temp: 98 F (36.7 C)  TempSrc: Oral  SpO2: 100%  Weight: 254 lb (115.2 kg)  Height: 6\' 2"  (1.88 m)   Cardiac: normal rate and rhythm  Pulmonary: CTAB, not in distress Psych: Alert, conversant, in good spirits Left foot: continued pustular drainage, hole seems larger now maybe 2cm Media Information    Document Information  Photos    03/19/2019 11:54  Attached To:  Office Visit on 03/19/19 with Katherine Roan, MD  Source Information  Jashay Roddy, Jenne Pane, MD  [facility]     Social History   Socioeconomic History  . Marital status: Single    Spouse name: Not on file  . Number of children: Not on file  . Years of education: Not on file  . Highest education level: Not on file  Occupational History  . Not on file  Social Needs  . Financial resource strain: Not on file  . Food insecurity    Worry: Not on file    Inability: Not on file  .  Transportation needs    Medical: Not on file    Non-medical: Not on file  Tobacco Use  . Smoking status: Former Smoker    Packs/day: 0.50    Years: 30.00    Pack years: 15.00    Types: Cigarettes    Quit date: 11/05/2010    Years since quitting: 8.3  . Smokeless tobacco: Never Used  Substance and Sexual Activity  . Alcohol use: Yes    Alcohol/week: 0.0 standard drinks    Comment: every now and then  . Drug use: No  .  Sexual activity: Never    Comment: quit in 2014  Lifestyle  . Physical activity    Days per week: Not on file    Minutes per session: Not on file  . Stress: Not on file  Relationships  . Social Herbalist on phone: Not on file    Gets together: Not on file    Attends religious service: Not on file    Active member of club or organization: Not on file    Attends meetings of clubs or organizations: Not on file    Relationship status: Not on file  . Intimate partner violence    Fear of current or ex partner: Not on file    Emotionally abused: Not on file    Physically abused: Not on file    Forced sexual activity: Not on file  Other Topics Concern  . Not on file  Social History Narrative  . Not on file   Family History  Problem Relation Age of Onset  . Heart disease Mother   . Colon cancer Father 30  . Kidney disease Brother        on dialysis  . Pancreatic cancer Brother   . Hypertension Sister   . Diabetes Sister   . Kidney disease Brother        on dialysis  . Diabetes Sister   . Colon cancer Brother        2 brothers with colon cancer  . Cancer Daughter 62       Died at age of 88, unknown type  . Anesthesia problems Neg Hx     Assessment & Plan:   See Encounters Tab for problem based charting.  Patient discussed with Dr. Lynnae January

## 2019-03-19 NOTE — Patient Instructions (Addendum)
[name] I will refill your antibiotics today.  Please work with wound care to keep changing your dressings.  We will see you again in around one week.

## 2019-03-20 ENCOUNTER — Telehealth: Payer: Self-pay | Admitting: *Deleted

## 2019-03-20 NOTE — Telephone Encounter (Signed)
[facility] - Mamie states Dr. Guadlupe Spanish ordered MRI of the left foot and wanted to make sure Dr. March Rummage had received the results and would like Dr. March Rummage to page him when convenient - Pager:  [phone]. I printed the MRI results and gave to Dr. March Rummage with Dr. Maudie Flakes pager number.

## 2019-03-20 NOTE — Telephone Encounter (Signed)
[facility] - Mamie states Dr. Shan Levans would like to be contacted on his cell [phone]. I changed contact information for Dr. Shan Levans on Dr. Eleanora Neighbor message page.

## 2019-03-21 ENCOUNTER — Ambulatory Visit (INDEPENDENT_AMBULATORY_CARE_PROVIDER_SITE_OTHER): Payer: Medicare Other | Admitting: Podiatry

## 2019-03-21 ENCOUNTER — Encounter: Payer: Self-pay | Admitting: Internal Medicine

## 2019-03-21 ENCOUNTER — Other Ambulatory Visit: Payer: Self-pay

## 2019-03-21 ENCOUNTER — Telehealth: Payer: Self-pay | Admitting: *Deleted

## 2019-03-21 ENCOUNTER — Encounter: Payer: Self-pay | Admitting: Podiatry

## 2019-03-21 DIAGNOSIS — Z01818 Encounter for other preprocedural examination: Secondary | ICD-10-CM | POA: Diagnosis not present

## 2019-03-21 DIAGNOSIS — M86672 Other chronic osteomyelitis, left ankle and foot: Secondary | ICD-10-CM

## 2019-03-21 DIAGNOSIS — L97524 Non-pressure chronic ulcer of other part of left foot with necrosis of bone: Secondary | ICD-10-CM | POA: Diagnosis not present

## 2019-03-21 DIAGNOSIS — E669 Obesity, unspecified: Secondary | ICD-10-CM | POA: Diagnosis not present

## 2019-03-21 NOTE — Telephone Encounter (Signed)
I am calling to inform you of your surgery date.  "Let me put you on speaker so my neighbor Gentry Fitz) can help me understand what's going on."  I have you scheduled for 03/26/2019 at 7:15 am at [place] will probably need to be there two hours prior to your surgery time.  You will receive a call from a pre-op nurse, she (he) will inform you of your arrival time. You will need that history and physical form that we gave you completed by your primary care doctor.  You must have seen your doctor within a 30 day window of your surgery date.  "I saw him last week."  You also need to have a Covid test done three to four days prior to your surgery date.  Someone from pre-testing will give you a call to schedule that appointment.  The test is done over at the old [facility].  It is done through a drive thru method.  (Cherie asked that the pre-op nurse call her about Mr. Dorgan arrival time as well as the pre-testing scheduler.  I informed her that she is not on Mr. Tinkle HIPAA forms.)

## 2019-03-21 NOTE — Telephone Encounter (Signed)
"  This is [name].  [name]'s niece.  Is it possible for you to put me down as his contact person.  I know how he is about answering his phone.  I think I'm on his list."  Yes, I will let the surgery schedulers know that you are the contact person.  I called and asked Maudie Mercury to put down [name] as the contact person for [name].

## 2019-03-21 NOTE — Patient Instructions (Signed)
Pre-Operative Instructions  Congratulations, you have decided to take an important step towards improving your quality of life.  You can be assured that the doctors and staff at [facility] will be with you every step of the way.  Here are some important things you should know:  1. Plan to be at the surgery center/hospital at least 1 (one) hour prior to your scheduled time, unless otherwise directed by the surgical center/hospital staff.  You must have a responsible adult accompany you, remain during the surgery and drive you home.  Make sure you have directions to the surgical center/hospital to ensure you arrive on time. 2. If you are having surgery at [facility] or [facility], you will need a copy of your medical history and physical form from your family physician within one month prior to the date of surgery. We will give you a form for your primary physician to complete.  3. We make every effort to accommodate the date you request for surgery.  However, there are times where surgery dates or times have to be moved.  We will contact you as soon as possible if a change in schedule is required.   4. No aspirin/ibuprofen for one week before surgery.  If you are on aspirin, any non-steroidal anti-inflammatory medications (Mobic, Aleve, Ibuprofen) should not be taken seven (7) days prior to your surgery.  You make take Tylenol for pain prior to surgery.  5. Medications - If you are taking daily heart and blood pressure medications, seizure, reflux, allergy, asthma, anxiety, pain or diabetes medications, make sure you notify the surgery center/hospital before the day of surgery so they can tell you which medications you should take or avoid the day of surgery. 6. No food or drink after midnight the night before surgery unless directed otherwise by surgical center/hospital staff. 7. No alcoholic beverages 24-hours prior to surgery.  No smoking 24-hours prior or 24-hours after  surgery. 8. Wear loose pants or shorts. They should be loose enough to fit over bandages, boots, and casts. 9. Don't wear slip-on shoes. Sneakers are preferred. 10. Bring your boot with you to the surgery center/hospital.  Also bring crutches or a walker if your physician has prescribed it for you.  If you do not have this equipment, it will be provided for you after surgery. 11. If you have not been contacted by the surgery center/hospital by the day before your surgery, call to confirm the date and time of your surgery. 12. Leave-time from work may vary depending on the type of surgery you have.  Appropriate arrangements should be made prior to surgery with your employer. 13. Prescriptions will be provided immediately following surgery by your doctor.  Fill these as soon as possible after surgery and take the medication as directed. Pain medications will not be refilled on weekends and must be approved by the doctor. 14. Remove nail polish on the operative foot and avoid getting pedicures prior to surgery. 15. Wash the night before surgery.  The night before surgery wash the foot and leg well with water and the antibacterial soap provided. Be sure to pay special attention to beneath the toenails and in between the toes.  Wash for at least three (3) minutes. Rinse thoroughly with water and dry well with a towel.  Perform this wash unless told not to do so by your physician.  Enclosed: 1 Ice pack (please put in freezer the night before surgery)   1 Hibiclens skin cleaner     Pre-op instructions  If you have any questions regarding the instructions, please do not hesitate to call our office.  [place]: [address], [place], [place] [address] -- [phone]  [place]: [address] [place], [place] [address] -- [phone]  [place]: [address]  [place], [place] [address] -- [phone]   Website: [email or web address] 

## 2019-03-21 NOTE — Telephone Encounter (Signed)
Dr. 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 March Rummage, please call [phone] at convenience.

## 2019-03-21 NOTE — Telephone Encounter (Signed)
Dr. March Rummage request pt to come in today for discussion of MRI results and treatment options. Routed message to schedulers.

## 2019-03-21 NOTE — Telephone Encounter (Signed)
DOS 03/26/2019 BONE BIOPSY - 20240, DEBRIDEMENT - 11043, AND SKIN GRAFT SUBSTITUTE - 54008 LEFT FOOT  [employer]HC MEDICARE: Eligibility Date - 06/26/2018 - 06/26/2019  Individual, In-Network  Plan Deductible Member's plan does not have a deductible.  Out-of-Pocket Maximum Per Service Year $0.00 of $6,700.00 Met  Remaining: $6,700.00   [facility] You pay $0.00 if you are enrolled in Medicaid as a Qualified Medicare Beneficiary (QMB).   Notification or Prior Authorization is not required for the requested services  This [employer] Medicare Advantage members plan does not currently require a prior authorization for these services. If you have general questions about the prior authorization requirements, please call us at [phone] or visit [email or web address] > Clinician Resources > Advance and Admission Notification Requirements. The number above acknowledges your notification. Please write this number down for future reference. Notification is not a guarantee of coverage or payment.  Decision ID #:[id number]

## 2019-03-24 ENCOUNTER — Telehealth: Payer: Self-pay | Admitting: *Deleted

## 2019-03-24 ENCOUNTER — Other Ambulatory Visit (HOSPITAL_COMMUNITY)
Admission: RE | Admit: 2019-03-24 | Discharge: 2019-03-24 | Disposition: A | Discharge status: Home / Self Care | Payer: Medicare Other | Source: Ambulatory Visit | Attending: Podiatry | Admitting: Podiatry

## 2019-03-24 ENCOUNTER — Other Ambulatory Visit: Payer: Self-pay

## 2019-03-24 ENCOUNTER — Encounter (HOSPITAL_COMMUNITY): Payer: Self-pay

## 2019-03-24 ENCOUNTER — Encounter (HOSPITAL_COMMUNITY)
Admission: RE | Admit: 2019-03-24 | Discharge: 2019-03-24 | Disposition: A | Discharge status: Home / Self Care | Payer: Medicare Other | Source: Ambulatory Visit | Attending: Podiatry | Admitting: Podiatry

## 2019-03-24 DIAGNOSIS — Z01812 Encounter for preprocedural laboratory examination: Secondary | ICD-10-CM | POA: Insufficient documentation

## 2019-03-24 DIAGNOSIS — Z20828 Contact with and (suspected) exposure to other viral communicable diseases: Secondary | ICD-10-CM | POA: Insufficient documentation

## 2019-03-24 HISTORY — DX: Polyneuropathy, unspecified: G62.9

## 2019-03-24 HISTORY — DX: Myoneural disorder, unspecified: G70.9

## 2019-03-24 LAB — BASIC METABOLIC PANEL
Anion gap: 11 (ref 5–15)
BUN: 19 mg/dL (ref 8–23)
CO2: 24 mmol/L (ref 22–32)
Calcium: 9.7 mg/dL (ref 8.9–10.3)
Chloride: 105 mmol/L (ref 98–111)
Creatinine, Ser: 1.18 mg/dL (ref 0.61–1.24)
GFR calc Af Amer: >60 mL/min (ref >60)
GFR calc non Af Amer: >60 mL/min (ref >60)
Glucose, Bld: 86 mg/dL (ref 70–99)
Potassium: 4.1 mmol/L (ref 3.5–5.1)
Sodium: 140 mmol/L (ref 135–145)

## 2019-03-24 LAB — CBC
HCT: 42.5 % (ref 39.0–52.0)
Hemoglobin: 13.9 g/dL (ref 13.0–17.0)
MCH: 30 pg (ref 26.0–34.0)
MCHC: 32.7 g/dL (ref 30.0–36.0)
MCV: 91.8 fL (ref 80.0–100.0)
Platelets: 221 10*3/uL (ref 150–400)
RBC: 4.63 MIL/uL (ref 4.22–5.81)
RDW: 16.8 % — ABNORMAL HIGH (ref 11.5–15.5)
WBC: 5.1 10*3/uL (ref 4.0–10.5)
nRBC: 0 % (ref 0.0–0.2)

## 2019-03-24 LAB — SARS CORONAVIRUS 2 (TAT 6-24 HRS): SARS Coronavirus 2: NEGATIVE

## 2019-03-24 NOTE — Patient Instructions (Addendum)
DUE TO COVID-19 ONLY ONE VISITOR IS ALLOWED TO COME WITH YOU AND STAY IN THE WAITING ROOM ONLY DURING PRE OP AND PROCEDURE DAY OF SURGERY. THE 1 VISITOR MAY VISIT WITH YOU AFTER SURGERY IN YOUR PRIVATE ROOM DURING VISITING HOURS ONLY!  Y) ONCE YOUR COVID TEST IS COMPLETED, PLEASE BEGIN THE QUARANTINE INSTRUCTIONS AS OUTLINED IN YOUR HANDOUT.                [name]    Your procedure is scheduled on: 03/26/19   Report to [facility] Main  Entrance   Report to admitting at    5:30 AM     Call this number if you have problems the morning of surgery [place], NO CHEWING GUM [place].     Take these medicines the morning of surgery with A SIP OF WATER: Gabapentin, Verapamil, Protonix                                 You may not have any metal on your body including piercings   Do not wear jewelry, make-up, lotions, powders or perfumes, deodorant                    Men may shave face and neck.   Do not bring valuables to the hospital. [place].  Contacts, dentures or bridgework may not be worn into surgery.     Patients discharged the day of surgery will not be allowed to drive home.   IF YOU ARE HAVING SURGERY AND GOING HOME THE SAME DAY, YOU MUST HAVE AN ADULT TO DRIVE YOU HOME AND BE WITH YOU FOR 24 HOURS.  YOU MAY GO HOME BY TAXI OR UBER OR ORTHERWISE, BUT AN ADULT MUST ACCOMPANY YOU HOME AND STAY WITH YOU FOR 24 HOURS.  Name and phone number of your driver:  Special Instructions: N/A              Please read over the following fact sheets you were given: _____________________________________________________________________             [facility] - Preparing for Surgery Before surgery, you can play an important role.   Because skin is not sterile, your skin needs to be as free of germs as possible.   You can reduce the number of  germs on your skin by washing with CHG (chlorahexidine gluconate) soap before surgery.   CHG is an antiseptic cleaner which kills germs and bonds with the skin to continue killing germs even after washing. Please DO NOT use if you have an allergy to CHG or antibacterial soaps.   If your skin becomes reddened/irritated stop using the CHG and inform your nurse when you arrive at Short Stay.   You may shave your face/neck. Please follow these instructions carefully:  1.  Shower with CHG Soap the night before surgery and the  morning of Surgery.  2.  If you choose to wash your hair, wash your hair first as usual with your  normal  shampoo.  3.  After you shampoo, rinse your hair and body thoroughly to remove the  shampoo.  4.  Use CHG as you would any other liquid soap.  You can apply chg directly  to the skin and wash                       Gently with a scrungie or clean washcloth.  5.  Apply the CHG Soap to your body ONLY FROM THE NECK DOWN.   Do not use on face/ open                           Wound or open sores. Avoid contact with eyes, ears mouth and genitals (private parts).                       Wash face,  Genitals (private parts) with your normal soap.             6.  Wash thoroughly, paying special attention to the area where your surgery  will be performed.  7.  Thoroughly rinse your body with warm water from the neck down.  8.  DO NOT shower/wash with your normal soap after using and rinsing off  the CHG Soap.                9.  Pat yourself dry with a clean towel.            10.  Wear clean pajamas.            11.  Place clean sheets on your bed the night of your first shower and do not  sleep with pets. Day of Surgery : Do not apply any lotions/deodorants the morning of surgery.  Please wear clean clothes to the hospital/surgery center.  FAILURE TO FOLLOW THESE INSTRUCTIONS MAY RESULT IN THE CANCELLATION OF YOUR SURGERY PATIENT  SIGNATURE_________________________________  NURSE SIGNATURE__________________________________  ________________________________________________________________________

## 2019-03-24 NOTE — Progress Notes (Addendum)
PCP - [name] Cardiologist - Dr. Buford Dresser  Chest x-ray - no EKG - 08/21/18 Stress Test - no ECHO - 06/20/16 stable aortic stenosis Cardiac Cath - no  Sleep Study - no CPAP -   Fasting Blood Sugar - NA Checks Blood Sugar _____ times a day  Blood Thinner Instructions:NAAspirin Instructions: Last Dose:  Anesthesia review:   Patient denies shortness of breath, fever, cough and chest pain at PAT appointment yes Patient verbalized understanding of instructions that were given to them at the PAT appointment. Patient was also instructed that they will need to review over the PAT instructions again at home before surgery. yes

## 2019-03-24 NOTE — Progress Notes (Signed)
Internal Medicine Clinic Attending  Case discussed with Dr. Winfrey  at the time of the visit.  We reviewed the resident's history and exam and pertinent patient test results.  I agree with the assessment, diagnosis, and plan of care documented in the resident's note.  

## 2019-03-24 NOTE — Telephone Encounter (Signed)
Done

## 2019-03-24 NOTE — Telephone Encounter (Signed)
"  Mr. Bazurto was here this morning.  We do not have any orders in the system.  Can you ask Dr. March Rummage to put those in?"  I will let him know.

## 2019-03-25 NOTE — Anesthesia Preprocedure Evaluation (Addendum)
Anesthesia Evaluation  Patient identified by MRN, date of birth, ID band Patient awake    Reviewed: Allergy & Precautions, NPO status , Patient's Chart, lab work & pertinent test results  History of Anesthesia Complications Negative for: history of anesthetic complications  Airway Mallampati: II  TM Distance: >3 FB Neck ROM: Full    Dental  (+) Upper Dentures, Missing,    Pulmonary former smoker,    Pulmonary exam normal        Cardiovascular hypertension, Pt. on medications + DVT (2019)  Normal cardiovascular exam  TTE 10/2018: EF 60-65%, mild LAE, mild AR, moderate AS (mean gradient 33mmHg), severe LVH     Neuro/Psych Chronic low back pain on opioids negative psych ROS   GI/Hepatic Neg liver ROS, GERD  Medicated and Controlled,Achalasia   Endo/Other  Obese, BMI 32  Renal/GU negative Renal ROS  negative genitourinary   Musculoskeletal  (+) Arthritis ,   Abdominal   Peds  Hematology negative hematology ROS (+)   Anesthesia Other Findings Day of surgery medications reviewed with patient.  Reproductive/Obstetrics negative OB ROS                            Anesthesia Physical Anesthesia Plan  ASA: III  Anesthesia Plan: Regional   Post-op Pain Management:    Induction:   PONV Risk Score and Plan: 1 and Treatment may vary due to age or medical condition, Ondansetron and Propofol infusion  Airway Management Planned: Natural Airway and Simple Face Mask  Additional Equipment:   Intra-op Plan:   Post-operative Plan:   Informed Consent: I have reviewed the patients History and Physical, chart, labs and discussed the procedure including the risks, benefits and alternatives for the proposed anesthesia with the patient or authorized representative who has indicated his/her understanding and acceptance.     Dental advisory given  Plan Discussed with: CRNA  Anesthesia Plan  Comments:       Anesthesia Quick Evaluation

## 2019-03-26 ENCOUNTER — Encounter (HOSPITAL_COMMUNITY)
Admission: RE | Disposition: A | Discharge status: Home / Self Care | Payer: Self-pay | Source: Home / Self Care | Attending: Podiatry

## 2019-03-26 ENCOUNTER — Encounter (HOSPITAL_COMMUNITY): Payer: Self-pay

## 2019-03-26 ENCOUNTER — Ambulatory Visit (HOSPITAL_COMMUNITY)
Admission: RE | Admit: 2019-03-26 | Discharge: 2019-03-26 | Disposition: A | Discharge status: Home / Self Care | Payer: Medicare Other | Attending: Podiatry | Admitting: Podiatry

## 2019-03-26 ENCOUNTER — Ambulatory Visit (HOSPITAL_COMMUNITY): Payer: Medicare Other | Admitting: Anesthesiology

## 2019-03-26 DIAGNOSIS — L97529 Non-pressure chronic ulcer of other part of left foot with unspecified severity: Secondary | ICD-10-CM | POA: Insufficient documentation

## 2019-03-26 DIAGNOSIS — M869 Osteomyelitis, unspecified: Secondary | ICD-10-CM | POA: Diagnosis not present

## 2019-03-26 DIAGNOSIS — L97523 Non-pressure chronic ulcer of other part of left foot with necrosis of muscle: Secondary | ICD-10-CM

## 2019-03-26 DIAGNOSIS — I82409 Acute embolism and thrombosis of unspecified deep veins of unspecified lower extremity: Secondary | ICD-10-CM | POA: Diagnosis not present

## 2019-03-26 DIAGNOSIS — I10 Essential (primary) hypertension: Secondary | ICD-10-CM | POA: Diagnosis not present

## 2019-03-26 DIAGNOSIS — M86672 Other chronic osteomyelitis, left ankle and foot: Secondary | ICD-10-CM

## 2019-03-26 DIAGNOSIS — M868X7 Other osteomyelitis, ankle and foot: Secondary | ICD-10-CM | POA: Diagnosis not present

## 2019-03-26 HISTORY — PX: GRAFT APPLICATION: SHX6696

## 2019-03-26 HISTORY — PX: WOUND DEBRIDEMENT: SHX247

## 2019-03-26 HISTORY — PX: BONE BIOPSY: SHX375

## 2019-03-26 SURGERY — DEBRIDEMENT, WOUND
Anesthesia: General | Laterality: Left

## 2019-03-26 MED ORDER — CEFAZOLIN SODIUM-DEXTROSE 2-4 GM/100ML-% IV SOLN
2.0000 g | INTRAVENOUS | Status: AC
  Administered 2019-03-26: 2 g via INTRAVENOUS
  Filled 2019-03-26: qty 100

## 2019-03-26 MED ORDER — FENTANYL CITRATE (PF) 100 MCG/2ML IJ SOLN
INTRAMUSCULAR | Status: AC
  Filled 2019-03-26: qty 2

## 2019-03-26 MED ORDER — PROPOFOL 10 MG/ML IV BOLUS
INTRAVENOUS | Status: AC
  Filled 2019-03-26: qty 40

## 2019-03-26 MED ORDER — BUPIVACAINE HCL (PF) 0.5 % IJ SOLN
INTRAMUSCULAR | Status: AC
  Filled 2019-03-26: qty 30

## 2019-03-26 MED ORDER — PROPOFOL 500 MG/50ML IV EMUL
INTRAVENOUS | Status: DC | PRN
  Administered 2019-03-26: 75 ug/kg/min via INTRAVENOUS

## 2019-03-26 MED ORDER — MIDAZOLAM HCL 2 MG/2ML IJ SOLN
INTRAMUSCULAR | Status: AC
  Filled 2019-03-26: qty 2

## 2019-03-26 MED ORDER — ROCURONIUM BROMIDE 10 MG/ML (PF) SYRINGE
PREFILLED_SYRINGE | INTRAVENOUS | Status: AC
  Filled 2019-03-26: qty 10

## 2019-03-26 MED ORDER — OXYCODONE HCL 5 MG PO TABS: 5.0000 mg | ORAL_TABLET | Freq: Three times a day (TID) | ORAL | 0 refills | Status: DC | PRN

## 2019-03-26 MED ORDER — PROPOFOL 10 MG/ML IV BOLUS
INTRAVENOUS | Status: DC | PRN
  Administered 2019-03-26: 20 mg via INTRAVENOUS

## 2019-03-26 MED ORDER — ONDANSETRON HCL 4 MG/2ML IJ SOLN
INTRAMUSCULAR | Status: AC
  Filled 2019-03-26: qty 2

## 2019-03-26 MED ORDER — ONDANSETRON HCL 4 MG/2ML IJ SOLN
INTRAMUSCULAR | Status: DC | PRN
  Administered 2019-03-26: 4 mg via INTRAVENOUS

## 2019-03-26 MED ORDER — FENTANYL CITRATE (PF) 100 MCG/2ML IJ SOLN: 25.0000 ug | INTRAMUSCULAR | Status: DC | PRN

## 2019-03-26 MED ORDER — BUPIVACAINE-EPINEPHRINE (PF) 0.5% -1:200000 IJ SOLN
INTRAMUSCULAR | Status: DC | PRN
  Administered 2019-03-26: 30 mL via PERINEURAL

## 2019-03-26 MED ORDER — OXYCODONE HCL 5 MG/5ML PO SOLN: 5.0000 mg | Freq: Once | ORAL | Status: DC | PRN

## 2019-03-26 MED ORDER — LIDOCAINE 2% (20 MG/ML) 5 ML SYRINGE
INTRAMUSCULAR | Status: DC | PRN
  Administered 2019-03-26 (×2): 40 mg via INTRAVENOUS

## 2019-03-26 MED ORDER — SODIUM CHLORIDE 0.9 % IR SOLN
Status: DC | PRN
  Administered 2019-03-26 (×2): 1000 mL

## 2019-03-26 MED ORDER — ACETAMINOPHEN 500 MG PO TABS
1000.0000 mg | ORAL_TABLET | Freq: Once | ORAL | Status: AC
  Administered 2019-03-26: 06:00:00 1000 mg via ORAL
  Filled 2019-03-26: qty 2

## 2019-03-26 MED ORDER — DEXAMETHASONE SODIUM PHOSPHATE 10 MG/ML IJ SOLN
INTRAMUSCULAR | Status: AC
  Filled 2019-03-26: qty 1

## 2019-03-26 MED ORDER — LIDOCAINE 2% (20 MG/ML) 5 ML SYRINGE
INTRAMUSCULAR | Status: AC
  Filled 2019-03-26: qty 5

## 2019-03-26 MED ORDER — MAGNESIUM SULFATE 2 GM/50ML IV SOLN
2.0000 g | INTRAVENOUS | Status: DC
  Filled 2019-03-26: qty 50

## 2019-03-26 MED ORDER — LACTATED RINGERS IV SOLN
INTRAVENOUS | Status: DC
  Administered 2019-03-26: 06:00:00 via INTRAVENOUS

## 2019-03-26 MED ORDER — PROMETHAZINE HCL 25 MG/ML IJ SOLN: 6.2500 mg | INTRAMUSCULAR | Status: DC | PRN

## 2019-03-26 MED ORDER — FENTANYL CITRATE (PF) 250 MCG/5ML IJ SOLN
INTRAMUSCULAR | Status: DC | PRN
  Administered 2019-03-26: 25 ug via INTRAVENOUS
  Administered 2019-03-26: 50 ug via INTRAVENOUS

## 2019-03-26 MED ORDER — OXYCODONE HCL 5 MG PO TABS: 5.0000 mg | ORAL_TABLET | Freq: Once | ORAL | Status: DC | PRN

## 2019-03-26 MED ORDER — PROPOFOL 10 MG/ML IV BOLUS
INTRAVENOUS | Status: AC
  Filled 2019-03-26: qty 20

## 2019-03-26 SURGICAL SUPPLY — 67 items
APL PRP STRL LF DISP 70% ISPRP (MISCELLANEOUS) ×1
BLADE HEX COATED 2.75 (ELECTRODE) ×3 IMPLANT
BLADE SURG 15 STRL LF DISP TIS (BLADE) ×1 IMPLANT
BLADE SURG 15 STRL SS (BLADE) ×3
BNDG CMPR 9X4 STRL LF SNTH (GAUZE/BANDAGES/DRESSINGS) ×1
BNDG ELASTIC 3X5.8 VLCR STR LF (GAUZE/BANDAGES/DRESSINGS) ×3 IMPLANT
BNDG ELASTIC 4X5.8 VLCR STR LF (GAUZE/BANDAGES/DRESSINGS) ×2 IMPLANT
BNDG ESMARK 4X9 LF (GAUZE/BANDAGES/DRESSINGS) ×3 IMPLANT
BNDG GAUZE ELAST 4 BULKY (GAUZE/BANDAGES/DRESSINGS) ×3 IMPLANT
CHLORAPREP W/TINT 26 (MISCELLANEOUS) ×3 IMPLANT
CONT SPEC 4OZ CLIKSEAL STRL BL (MISCELLANEOUS) ×6 IMPLANT
COVER BACK TABLE 60X90IN (DRAPES) ×3 IMPLANT
COVER MAYO STAND STRL (DRAPES) ×2 IMPLANT
COVER WAND RF STERILE (DRAPES) IMPLANT
CUFF TOURN SGL QUICK 18X4 (TOURNIQUET CUFF) IMPLANT
DRAPE EXTREMITY T 121X128X90 (DISPOSABLE) ×3 IMPLANT
DRAPE IMP U-DRAPE 54X76 (DRAPES) ×3 IMPLANT
DRAPE OEC MINIVIEW 54X84 (DRAPES) ×2 IMPLANT
DRAPE U-SHAPE 47X51 STRL (DRAPES) ×3 IMPLANT
DRESSING MATRIX 2X2 BILAYER (Tissue) IMPLANT
DRSG EMULSION OIL 3X3 NADH (GAUZE/BANDAGES/DRESSINGS) ×3 IMPLANT
DRSG MATRIX 2X2 BILAYER (Tissue) ×3 IMPLANT
DRSG MEPITEL 8X12 (GAUZE/BANDAGES/DRESSINGS) ×1 IMPLANT
DRSG PAD ABDOMINAL 8X10 ST (GAUZE/BANDAGES/DRESSINGS) IMPLANT
ELECT REM PT RETURN 15FT ADLT (MISCELLANEOUS) ×3 IMPLANT
GAUZE 4X4 16PLY RFD (DISPOSABLE) IMPLANT
GAUZE SPONGE 4X4 12PLY STRL (GAUZE/BANDAGES/DRESSINGS) ×3 IMPLANT
GLOVE BIO SURGEON STRL SZ7.5 (GLOVE) ×3 IMPLANT
GLOVE BIOGEL PI IND STRL 8 (GLOVE) ×1 IMPLANT
GLOVE BIOGEL PI INDICATOR 8 (GLOVE) ×2
GOWN STRL REUS W/ TWL LRG LVL3 (GOWN DISPOSABLE) ×1 IMPLANT
GOWN STRL REUS W/ TWL XL LVL3 (GOWN DISPOSABLE) ×1 IMPLANT
GOWN STRL REUS W/TWL LRG LVL3 (GOWN DISPOSABLE) ×3
GOWN STRL REUS W/TWL XL LVL3 (GOWN DISPOSABLE) ×3
JAMSHIDI TM ×2 IMPLANT
KIT BASIN OR (CUSTOM PROCEDURE TRAY) ×3 IMPLANT
MANIFOLD NEPTUNE II (INSTRUMENTS) ×3 IMPLANT
MATRIX WOUND FLOWABLE 3CC (Tissue) ×2 IMPLANT
MEPITEL 8X12 (GAUZE/BANDAGES/DRESSINGS) ×1
NDL HYPO 25X1 1.5 SAFETY (NEEDLE) ×1 IMPLANT
NEEDLE HYPO 25X1 1.5 SAFETY (NEEDLE) ×3 IMPLANT
NS IRRIG 1000ML POUR BTL (IV SOLUTION) IMPLANT
PACK ORTHO EXTREMITY (CUSTOM PROCEDURE TRAY) ×3 IMPLANT
PADDING CAST ABS 4INX4YD NS (CAST SUPPLIES) ×2
PADDING CAST ABS COTTON 4X4 ST (CAST SUPPLIES) ×1 IMPLANT
PENCIL SMOKE EVACUATOR (MISCELLANEOUS) ×3 IMPLANT
SET IRRIG Y TYPE TUR BLADDER L (SET/KITS/TRAYS/PACK) IMPLANT
SLEEVE SCD COMPRESS KNEE MED (MISCELLANEOUS) ×3 IMPLANT
STAPLER VISISTAT (STAPLE) ×2 IMPLANT
STOCKINETTE 4X48 STRL (DRAPES) ×3 IMPLANT
STOCKINETTE 6  STRL (DRAPES) ×2
STOCKINETTE 6 STRL (DRAPES) ×1 IMPLANT
SUT CHROMIC 4 0 P 3 18 (SUTURE) ×2 IMPLANT
SUT ETHILON 4 0 PS 2 18 (SUTURE) IMPLANT
SUT MNCRL AB 3-0 PS2 18 (SUTURE) IMPLANT
SUT MNCRL AB 4-0 PS2 18 (SUTURE) IMPLANT
SUT MON AB 5-0 PS2 18 (SUTURE) IMPLANT
SUT VIC AB 3-0 FS2 27 (SUTURE) ×3 IMPLANT
SUT VICRYL 4-0 PS2 18IN ABS (SUTURE) ×3 IMPLANT
SWAB COLLECTION DEVICE MRSA (MISCELLANEOUS) ×2 IMPLANT
SWAB CULTURE ESWAB REG 1ML (MISCELLANEOUS) ×2 IMPLANT
SYR BULB 3OZ (MISCELLANEOUS) ×3 IMPLANT
SYR CONTROL 10ML LL (SYRINGE) ×3 IMPLANT
TOWEL OR 17X26 10 PK STRL BLUE (TOWEL DISPOSABLE) ×3 IMPLANT
TRAY PREP A LATEX SAFE STRL (SET/KITS/TRAYS/PACK) ×2 IMPLANT
UNDERPAD 30X36 HEAVY ABSORB (UNDERPADS AND DIAPERS) ×3 IMPLANT
YANKAUER SUCT BULB TIP NO VENT (SUCTIONS) ×3 IMPLANT

## 2019-03-26 NOTE — H&P (Signed)
  Subjective:  Patient ID: ZERIK HARBAUGH, male    DOB: [birth year],  MRN: [id number]  Seen in pre-op. Consent reviewed, patient has signed. Objective:   Vitals:   03/26/19 0535  BP: 136/66  Pulse: 69  Resp: 16  Temp: 98.9 F (37.2 C)  SpO2: 100%   General AA&O x3. Normal mood and affect.  Vascular Dorsalis pedis and posterior tibial pulses 2/4 bilat. Brisk capillary refill to all digits. Pedal hair present.  Neurologic Epicritic sensation grossly intact.  Dermatologic Left foot wound dressed. No warmth erythema ascending cellulitis left foot  Orthopedic: MMT 5/5 in dorsiflexion, plantarflexion, inversion, and eversion. Normal joint ROM without pain or crepitus.    Assessment & Plan:  Patient was evaluated and treated and all questions answered.  Left Foot Wound, Concern for osteo -To OR today for debridement, skin graft sub, bone biopsy -Consent reviewed and signed -Hold abx until after culture.  Evelina Bucy, DPM  Accessible via secure chat for questions or concerns.

## 2019-03-26 NOTE — Anesthesia Postprocedure Evaluation (Signed)
Anesthesia Post Note  Patient: JAKEL CROUCH  Procedure(s) Performed: DEBRIDEMENT WOUND (Left ) BONE BIOPSY (Left ) SKIN GRAFT SUBSTITUTE (Left )     Patient location during evaluation: PACU Anesthesia Type: Regional Level of consciousness: awake and alert and oriented Pain management: pain level controlled Vital Signs Assessment: post-procedure vital signs reviewed and stable Respiratory status: spontaneous breathing, nonlabored ventilation and respiratory function stable Cardiovascular status: blood pressure returned to baseline Postop Assessment: no apparent nausea or vomiting Anesthetic complications: no    Last Vitals:  Vitals:   03/26/19 0535 03/26/19 0807  BP: 136/66 118/67  Pulse: 69 64  Resp: 16 12  Temp: 37.2 C 36.6 C  SpO2: 100% 100%    Last Pain:  Vitals:   03/26/19 0830  TempSrc:   PainSc: 0-No pain                 Brennan Bailey

## 2019-03-26 NOTE — Transfer of Care (Signed)
Immediate Anesthesia Transfer of Care Note  Patient: [name]  Procedure(s) Performed: DEBRIDEMENT WOUND (Left ) BONE BIOPSY (Left ) SKIN GRAFT SUBSTITUTE (Left )  Patient Location: PACU  Anesthesia Type:MAC  Level of Consciousness: awake, alert  and oriented  Airway & Oxygen Therapy: Patient Spontanous Breathing and Patient connected to face mask oxygen  Post-op Assessment: Report given to RN and Post -op Vital signs reviewed and stable  Post vital signs: Reviewed and stable  Last Vitals:  Vitals Value Taken Time  BP 118/67 03/26/19 0807  Temp    Pulse 62 03/26/19 0808  Resp 12 03/26/19 0808  SpO2 100 % 03/26/19 0808  Vitals shown include unvalidated device data.  Last Pain:  Vitals:   03/26/19 0535  TempSrc: Oral         Complications: No apparent anesthesia complications

## 2019-03-26 NOTE — Progress Notes (Signed)
Subjective:  Patient ID: [name], male    DOB: [birth year],  MRN: [id number]  Chief Complaint  Patient presents with  . Foot Ulcer    right foot is doing ok, some drainage, no bleeding noticed    73 y.o. male presents for wound care. Hx as above. Had MRI performed here for review.   Review of Systems: Negative except as noted in the HPI. Denies N/V/F/Ch.  Past Medical History:  Diagnosis Date  . 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  Excellent symptomatic response to LES Botox injections.   . Anemia 08/20/2018  . AVM (arteriovenous malformation) of colon 08/26/2013   Cecum X 3, without bleeding on colonoscopy February 2015   . B12 deficiency 10/01/2011   Discovered to 2 progressive neurologic pain and dysfunction.  Diagnosis established March 2013.  Requires the following treatment: B12 IM monthly until level normalizes. After that, oral supplementation.   . Cataract   . Chronic venous insufficiency 11/28/2013  . Constipation due to opioid therapy 03/11/2015  . Deep venous thrombosis ([place]) 05/31/2018   Right lower extremity, unprovoked  . Diverticulosis 08/26/2013  . Essential hypertension 06/15/2006  . Family history of malignant neoplasm of gastrointestinal tract 06/30/2013   Father with colon cancer age 23   . Gastroesophageal reflux disease 02/04/2007  . GI bleeding 08/21/2018  . Internal hemorrhoids 08/26/2013  . Left posterior subcapsular cataract 10/03/2013   s/p yag laser capsulotomy 10/03/2013   . 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  Complicated by chronic low back pain and neuropathy.  Nerve conduction study (10/19/11): Diffusely low motor amplitudes, with primarily involvement of the peroneal and posterior tibial nerves. No  evidence of generalized peripheral neuropathy. Findings could be c/w bil multilevel lumbosacral radiculopathies or a primary motor neuropathy.   . Mild aortic valve stenosis 10/09/2006   Echo (12/09/2009): Valve area: 2.03 cm2   . Neuromuscular disorder ([place])   . Neuropathy   . Obesity, Class I, BMI 30-34.9 04/05/2012  . Onychomycosis of toenail 06/05/2014  . Osteoarthritis 11/25/2009   Right knee (medial compartment), right hip, left shoulder   . Peripheral neuropathy   . 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 Trigger little finger of right hand 03/11/2015   No current facility-administered medications for this visit.  No current outpatient medications on file.  Facility-Administered Medications Ordered in Other Visits:  .  ceFAZolin (ANCEF) IVPB 2g/100 mL premix, 2 g, Intravenous, On Call to OR, Evelina Bucy, DPM .  fentaNYL (SUBLIMAZE) injection, , Intravenous, Anesthesia Intra-op, Good, Melanie B, CRNA, 50 mcg at 03/26/19 0656 .  lactated ringers infusion, , Intravenous, Continuous, Ellender, Karyl Kinnier, MD, Last Rate: 50 mL/hr at 03/26/19 0554  Social History   Tobacco Use  Smoking Status Former Smoker  . Packs/day: 0.50  . Years: 30.00  . Pack years: 15.00  . Types: Cigarettes  . Quit date: 11/05/2010  . Years since quitting: 8.3  Smokeless Tobacco Never Used    Allergies  Allergen Reactions  . Feraheme [Ferumoxytol]     Found unresponsive after starting infusion  . Amlodipine Swelling and Other (See Comments)    Bilateral lower extremity swelling   Objective:  There were no vitals filed for this visit. There is no  height or weight on file to calculate BMI. Constitutional Well developed. Well nourished.  Vascular Dorsalis pedis pulses palpable bilaterally. Posterior tibial pulses palpable bilaterally. Capillary refill normal to all digits.  No cyanosis or clubbing noted. Pedal hair growth normal.  Neurologic  Normal speech. Oriented to person, place, and time. Protective sensation absent  Dermatologic Wound Location: left lateral foot Wound Base: Mixed Granular/Fibrotic Peri-wound: Macerated, Calloused Exudate: Scant/small amount Serosanguinous exudate Wound Measurements: -3x1  Orthopedic: No pain to palpation either foot.   Radiographs: MRI reviewed 9/23  1. Area of ulceration seen over the dorsal lateral aspect of the midfoot with findings suggestive of early osteomyelitis versus reactive marrow the dorsal lateral aspect of the cuboid. No abscess or sinus tract 2. Reactive marrow within the anterior process of the cuboid and the base of the fifth metatarsal.  Assessment:   1. Preprocedural examination   2. Ulcer of left foot, with necrosis of bone ([place])   3. Osteomyelitis, chronic, ankle or foot, left ([facility])   4. Obesity, Class II, BMI 35-39.9    Plan:  Patient was evaluated and treated and all questions answered.  Ulcer left foot, concern for OM -MRI reviewed with patient. Though the MRI may suggest early OM clinically the wound does not appear infected; likely chronic OM. -Discussed benefit of bone biopsy to evaluate for OM. Will need culture prior to Abx.  -Discussed surgical plan. All questions answered -Patient has failed all conservative therapy and wishes to proceed with surgical intervention. All risks, benefits, and alternatives discussed with patient. No guarantees given. Consent reviewed and signed by patient. -Planned procedures: left foot wound debridement, application of skin graft substitute,    No follow-ups on file.

## 2019-03-26 NOTE — Op Note (Signed)
Patient Name: [name] DOB: 28-May-1946  MRN: [id number]   Date of Service: 03/26/2019  Surgeon: Dr. Hardie Pulley, DPM Assistants: None Pre-operative Diagnosis: Left foot ulcer, osteomyelitis Post-operative Diagnosis: same Procedures:             1) Debridement of Left foot Ulcer  2) Application of skin graft substitute  3) Bone biopsy left 5th metatarsal  4) Bone biopsy left cuboid Pathology/Specimens: L 5th metatarsal bone for path and micro L cuboid bone for path and micro Anesthesia: MAC/local Hemostasis: Anatomic Estimated Blood Loss: 5 ml Materials: None Medications: None Complications: None  Indications for Procedure:  This is a 73 y.o. male with a chronic left foot wound. MRI suggestive of OM, presents for debridement and skin graft application to promote healing and biopsy of the bones to eval for OM.   Procedure in Detail: Patient was identified in pre-operative holding area. Formal consent was signed and the left lower extremity was marked. Patient was brought back to the operating room and placed on the operating room table in the supine position. Anesthesia was induced.   The extremity was prepped and draped in the usual sterile fashion. Timeout was taken to confirm patient name, laterality, and procedure prior to incision. Attention was then directed to the left foot where a wound measuring 2.5x1 was encountered.  The wound was sharply excisionally debrided with a 15 blade, followed by a misonix ultrasonic debrider. Debridement was performed to bleeding, viable wound base. The wound was debrided to the level of the fascial tissue. Following debridement the wound measured 3x1.5.  Bone Biopsy 5th Metatarsal Attention was then directed to the 5th metatarsal. An incision was made over the 5th metatarsal adjacent to the wound. Blunt dissection was performed down to the bone. A Jamshidi needle was used to biopsy the bone. Fluoroscopy was used to confirm the correct  bone for biopsy. The bone plug was collected and split for path and micro. The bone at this area was rather soft.  Bone Biopsy Cuboid Attention was then directed to the cuboid. An incision was made over the cuboid just proximal to the wound. Blunt dissection was performed down to the bone. A Jamshidi needle was used to biopsy the bone. Fluoroscopy was used to confirm the correct bone for biopsy. The bone plug was collected and split for path and micro. The bone at this area was firm and hard.  Antibiotics were then administered IV; they were held until after culture of each bone.  Application of skin graft substitute Integra flowable matrix was then applied to the wound bed, followed by integra Bilayer graft. This was adhered with skin staples.  The incisions for the biopsies were closed with 4-0 chromic. Mepilex was applied over the wound. The foot was then dressed with 4x4, kerlix, and ACE bandage. Patient tolerated the procedure well.   Disposition: Following a period of post-operative monitoring, patient will be transferred back home.

## 2019-03-26 NOTE — Anesthesia Procedure Notes (Signed)
Procedure Name: MAC Date/Time: 03/26/2019 7:20 AM Performed by: Eben Burow, CRNA Pre-anesthesia Checklist: Patient identified, Emergency Drugs available, Suction available, Patient being monitored and Timeout performed Oxygen Delivery Method: Simple face mask Dental Injury: Teeth and Oropharynx as per pre-operative assessment

## 2019-03-26 NOTE — Anesthesia Procedure Notes (Addendum)
Anesthesia Regional Block: Popliteal block   Pre-Anesthetic Checklist: ,, timeout performed, Correct Patient, Correct Site, Correct Laterality, Correct Procedure, Correct Position, site marked, Risks and benefits discussed, pre-op evaluation,  At surgeon's request and post-op pain management  Laterality: Left  Prep: Maximum Sterile Barrier Precautions used, chloraprep       Needles:  Injection technique: Single-shot  Needle Type: Echogenic Stimulator Needle     Needle Length: 9cm  Needle Gauge: 22     Additional Needles:   Procedures:,,,, ultrasound used (permanent image in chart),,,,  Narrative:  Start time: 03/26/2019 6:58 AM End time: 03/26/2019 7:00 AM Injection made incrementally with aspirations every 5 mL.  Performed by: Personally  Anesthesiologist: Brennan Bailey, MD  Additional Notes: Risks, benefits, and alternative discussed. Patient gave consent for procedure. Patient prepped and draped in sterile fashion. Sedation administered, patient remains easily responsive to voice. Relevant anatomy identified with ultrasound guidance. Local anesthetic given in 5cc increments with no signs or symptoms of intravascular injection. No pain or paraesthesias with injection. Patient monitored throughout procedure with signs of LAST or immediate complications. Tolerated well. Ultrasound image placed in chart.  Tawny Asal, MD

## 2019-03-27 ENCOUNTER — Telehealth: Payer: Self-pay

## 2019-03-27 LAB — SURGICAL PATHOLOGY

## 2019-03-27 LAB — ACID FAST SMEAR (AFB, MYCOBACTERIA)
Acid Fast Smear: NEGATIVE
Acid Fast Smear: NEGATIVE

## 2019-03-27 NOTE — Telephone Encounter (Signed)
POST OP CALL-    1) General condition stated by the patient:   2) Is the pt having pain?   3) Pain score:   4) Has the pt taken Rx'd pain medication, regularly or PRN?   5) Is the pain medication giving relief?  6) Any fever, chills, nausea, or vomiting, shortness of breath or tightness in calf?  7) Is the bandage clean, dry and intact?  8) Is there excessive tightness, bleeding or drainage coming through the bandage?  9) Did you understand all of the post op instruction sheet given?  10) Any questions or concerns regarding post op care/recovery?    Confirmed POV appointment with patient   Pt did not answer phone call and did not have a voicemail that was set up.

## 2019-03-28 ENCOUNTER — Other Ambulatory Visit: Payer: Self-pay | Admitting: Podiatry

## 2019-03-31 LAB — AEROBIC/ANAEROBIC CULTURE W GRAM STAIN (SURGICAL/DEEP WOUND)
Culture: NO GROWTH
Gram Stain: NONE SEEN

## 2019-03-31 NOTE — Telephone Encounter (Signed)
Dr. Price please advice 

## 2019-04-01 ENCOUNTER — Encounter (HOSPITAL_COMMUNITY): Payer: Self-pay | Admitting: Podiatry

## 2019-04-01 ENCOUNTER — Telehealth: Payer: Self-pay | Admitting: *Deleted

## 2019-04-01 ENCOUNTER — Telehealth: Payer: Self-pay | Admitting: Internal Medicine

## 2019-04-01 LAB — AEROBIC/ANAEROBIC CULTURE W GRAM STAIN (SURGICAL/DEEP WOUND)
Culture: NO GROWTH
Gram Stain: NONE SEEN

## 2019-04-01 NOTE — Telephone Encounter (Signed)
Pt's Apartment Coordinator Mickel Baas @ "[facility]" calling to report patient had Foot Surgery last 03/26/2019 and no one has been out to check his Foot Dressing and she is concerned.  She is also not sure which Agency the patient uses and would like a call back.

## 2019-04-01 NOTE — Telephone Encounter (Signed)
Attempted to rtc, no vmail, rang many times and then said to try later. Will try again. Pt or rep needs to call surgeon for advice

## 2019-04-01 NOTE — Telephone Encounter (Signed)
Anointed [place] states pt wanted to know when someone was coming out to change the dressing. I told Ms Mirna Mires that pt's dressing was to remain intact until seen in office 04/03/2019 at 8:30am. Ms Mirna Mires stated, Well it is all messed up he is suppose to see Dr. Heber Bradley Thursday the 8th at 10:15am." I asked that Ms Mirna Mires set up the appt for pt to see Dr. March Rummage in our office 04/03/2019 at 8:30am and she stated she could do that. I told her pt would still be able to make the appt with Dr. Heber .

## 2019-04-03 ENCOUNTER — Ambulatory Visit: Payer: Medicare Other | Admitting: Internal Medicine

## 2019-04-03 ENCOUNTER — Other Ambulatory Visit: Payer: Self-pay

## 2019-04-03 ENCOUNTER — Encounter: Payer: Self-pay | Admitting: Internal Medicine

## 2019-04-03 ENCOUNTER — Ambulatory Visit (INDEPENDENT_AMBULATORY_CARE_PROVIDER_SITE_OTHER): Payer: Medicare Other | Admitting: Internal Medicine

## 2019-04-03 ENCOUNTER — Ambulatory Visit (INDEPENDENT_AMBULATORY_CARE_PROVIDER_SITE_OTHER): Payer: Medicare Other | Admitting: Podiatry

## 2019-04-03 ENCOUNTER — Encounter: Payer: Self-pay | Admitting: Podiatry

## 2019-04-03 VITALS — Temp 97.3°F

## 2019-04-03 DIAGNOSIS — Z23 Encounter for immunization: Secondary | ICD-10-CM | POA: Diagnosis not present

## 2019-04-03 DIAGNOSIS — B9689 Other specified bacterial agents as the cause of diseases classified elsewhere: Secondary | ICD-10-CM

## 2019-04-03 DIAGNOSIS — Z792 Long term (current) use of antibiotics: Secondary | ICD-10-CM | POA: Diagnosis not present

## 2019-04-03 DIAGNOSIS — Z9889 Other specified postprocedural states: Secondary | ICD-10-CM

## 2019-04-03 DIAGNOSIS — R011 Cardiac murmur, unspecified: Secondary | ICD-10-CM

## 2019-04-03 DIAGNOSIS — Z1611 Resistance to penicillins: Secondary | ICD-10-CM

## 2019-04-03 DIAGNOSIS — L089 Local infection of the skin and subcutaneous tissue, unspecified: Secondary | ICD-10-CM

## 2019-04-03 NOTE — Progress Notes (Signed)
   CC: left foot infection s/p debridement   HPI:  [name] is a 73 y.o. with PMHx below. Please see problem based charting for details of presentation , assessment, and plan.   Past Medical History:  Diagnosis Date  . 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  Excellent symptomatic response to LES Botox injections.   . Anemia 08/20/2018  . AVM (arteriovenous malformation) of colon 08/26/2013   Cecum X 3, without bleeding on colonoscopy February 2015   . B12 deficiency 10/01/2011   Discovered to 2 progressive neurologic pain and dysfunction.  Diagnosis established March 2013.  Requires the following treatment: B12 IM monthly until level normalizes. After that, oral supplementation.   . Cataract   . Chronic venous insufficiency 11/28/2013  . Constipation due to opioid therapy 03/11/2015  . Deep venous thrombosis ([place]) 05/31/2018   Right lower extremity, unprovoked  . Diverticulosis 08/26/2013  . Essential hypertension 06/15/2006  . Family history of malignant neoplasm of gastrointestinal tract 06/30/2013   Father with colon cancer age 63   . Gastroesophageal reflux disease 02/04/2007  . GI bleeding 08/21/2018  . Internal hemorrhoids 08/26/2013  . Left posterior subcapsular cataract 10/03/2013   s/p yag laser capsulotomy 10/03/2013   . 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  Complicated by chronic low back pain and neuropathy.  Nerve conduction study (10/19/11): Diffusely low motor amplitudes, with primarily involvement of the peroneal and posterior tibial nerves. No evidence of generalized peripheral neuropathy. Findings could be c/w bil multilevel lumbosacral radiculopathies or a primary motor neuropathy.   . Mild aortic valve stenosis 10/09/2006   Echo  (12/09/2009): Valve area: 2.03 cm2   . Neuromuscular disorder ([place])   . Neuropathy   . Obesity, Class I, BMI 30-34.9 04/05/2012  . Onychomycosis of toenail 06/05/2014  . Osteoarthritis 11/25/2009   Right knee (medial compartment), right hip, left shoulder   . Peripheral neuropathy   . 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 Trigger little finger of right hand 03/11/2015   Review of Systems:  Review of Systems  Constitutional: Negative for chills and fever.  Respiratory: Negative for cough and shortness of breath.   Gastrointestinal: Negative for blood in stool and melena.    Vitals:   04/03/19 1007  BP: 108/60  Pulse: 67  Temp: 98.7 F (37.1 C)  TempSrc: Oral  SpO2: 100%  Weight: 255 lb 1.6 oz (115.7 kg)   Physical Exam: Physical Exam Constitutional:      General: He is not in acute distress. Cardiovascular:     Rate and Rhythm: Normal rate and regular rhythm.     Heart sounds: Murmur (RUSB 3/6 systolic murmur) present.  Musculoskeletal:        General: Signs of injury (left foot is wrapped. Seen today by podiatry) present.     Right lower leg: Edema (trace ) present.     Left lower leg: Edema (trace) present.  Neurological:     Mental Status: He is alert.      Assessment & Plan:   See Encounters Tab for problem based charting.  Patient seen with Dr. Daryll Drown

## 2019-04-03 NOTE — Assessment & Plan Note (Addendum)
Patient seen in clinic on the 9/16 and started on doxycycline and amoxicillin.  Appears these antibiotics were continued until his debridement.  Patient reports restarting and finishing his antibiotics on Monday 10/5.  He was taken for debridement on 9/30.Cultures show: few Citrobacter species and Providencia.  See sensitivities, not sure if sensitive to doxycycline.  Providencia resistant to amoxicillin. patient is being followed by wound care and podiatry.  Patient seen this morning by podiatry and patient says his foot is healing. No signs of tracking infection. We will not remove wrap at this time.  Patient is to be seen by podiatry next week on Thursday.  He denies any fevers chills.  Will have close follow-up.

## 2019-04-03 NOTE — Patient Instructions (Signed)
Thank you for trusting me with your care. To recap, today we discussed the following:   Left foot surgery - Your other doctors have prescribed antibiotic and pain medication. - Continue to follow up for bandage changes  You received flu vaccine today.  My best,   Tamsen Snider, MD

## 2019-04-04 NOTE — Telephone Encounter (Signed)
Patient seen in [facility] 04/03/2019. [name], BSN, RN-BC
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
## 2019-04-08 NOTE — Progress Notes (Signed)
Internal Medicine Clinic Attending  I saw and evaluated the patient.  I personally confirmed the key portions of the history and exam documented by Dr. Steen and I reviewed pertinent patient test results.  The assessment, diagnosis, and plan were formulated together and I agree with the documentation in the resident's note.     

## 2019-04-10 ENCOUNTER — Other Ambulatory Visit: Payer: Self-pay

## 2019-04-10 ENCOUNTER — Ambulatory Visit (INDEPENDENT_AMBULATORY_CARE_PROVIDER_SITE_OTHER): Payer: Medicare Other | Admitting: Podiatry

## 2019-04-10 DIAGNOSIS — L97524 Non-pressure chronic ulcer of other part of left foot with necrosis of bone: Secondary | ICD-10-CM

## 2019-04-10 DIAGNOSIS — Z09 Encounter for follow-up examination after completed treatment for conditions other than malignant neoplasm: Secondary | ICD-10-CM

## 2019-04-10 NOTE — Progress Notes (Signed)
Subjective:  Patient ID: [name], male    DOB: March 22, 1946,  MRN: [id number]  Chief Complaint  Patient presents with  . Routine Post Op     DOS 03/26/2019 BONE BIOPSY, WOUND DEBRIDEMENT, AND SKIN GRAFT APPLICATION LT. " my foot is hurting"     DOS: 03/26/2019 Procedure: Bone biopsies x2, wound debridement, application of skin graft substitute  73 y.o. male returns for post-op check. Doing well, having some pain but denies issues.  Review of Systems: Negative except as noted in the HPI. Denies N/V/F/Ch.  Past Medical History:  Diagnosis Date  . 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  Excellent symptomatic response to LES Botox injections.   . Anemia 08/20/2018  . AVM (arteriovenous malformation) of colon 08/26/2013   Cecum X 3, without bleeding on colonoscopy February 2015   . B12 deficiency 10/01/2011   Discovered to 2 progressive neurologic pain and dysfunction.  Diagnosis established March 2013.  Requires the following treatment: B12 IM monthly until level normalizes. After that, oral supplementation.   . Cataract   . Chronic venous insufficiency 11/28/2013  . Constipation due to opioid therapy 03/11/2015  . Deep venous thrombosis ([place]) 05/31/2018   Right lower extremity, unprovoked  . Diverticulosis 08/26/2013  . Essential hypertension 06/15/2006  . Family history of malignant neoplasm of gastrointestinal tract 06/30/2013   Father with colon cancer age 60   . Gastroesophageal reflux disease 02/04/2007  . GI bleeding 08/21/2018  . Internal hemorrhoids 08/26/2013  . Left posterior subcapsular cataract 10/03/2013   s/p yag laser capsulotomy 10/03/2013   . 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  Complicated by chronic low back pain and  neuropathy.  Nerve conduction study (10/19/11): Diffusely low motor amplitudes, with primarily involvement of the peroneal and posterior tibial nerves. No evidence of generalized peripheral neuropathy. Findings could be c/w bil multilevel lumbosacral radiculopathies or a primary motor neuropathy.   . Mild aortic valve stenosis 10/09/2006   Echo (12/09/2009): Valve area: 2.03 cm2   . Neuromuscular disorder ([place])   . Neuropathy   . Obesity, Class I, BMI 30-34.9 04/05/2012  . Onychomycosis of toenail 06/05/2014  . Osteoarthritis 11/25/2009   Right knee (medial compartment), right hip, left shoulder   . Peripheral neuropathy   . 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 Trigger little finger of right hand 03/11/2015    Current Outpatient Medications:  .  amoxicillin (AMOXIL) 875 MG tablet, Take 1 tablet (875 mg total) by mouth 2 (two) times daily., Disp: 14 tablet, Rfl: 0 .  COMBIGAN 0.2-0.5 % ophthalmic solution, Place 1 drop into both eyes 2 (two) times daily., Disp: , Rfl:  .  doxycycline (VIBRA-TABS) 100 MG tablet, Take 1 tablet (100 mg total) by mouth 2 (two) times daily., Disp: 14 tablet, Rfl: 0 .  FEROSUL 325 (65 Fe) MG tablet, TAKE 1 TABLET BY MOUTH DAILY., Disp: 30 tablet, Rfl: 2 .  furosemide (LASIX) 40 MG tablet, TAKE 1 TABLET BY MOUTH DAILY., Disp: 30 tablet, Rfl: 11 .  gabapentin (NEURONTIN) 300 MG capsule, Take 2 capsules (600 mg total) by mouth 3 (three) times daily. (Patient taking differently: Take 600 mg by mouth 2 (two) times daily. ), Disp: 540 capsule, Rfl: 3 .  oxyCODONE (OXY  IR/ROXICODONE) 5 MG immediate release tablet, TAKE 1 TABLET BY MOUTH EVERY 8 HOURS AS NEEDED FOR SEVERE PAIN ., Disp: 12 tablet, Rfl: 0 .  oxyCODONE-acetaminophen (PERCOCET) 10-325 MG tablet, Take 1 tablet by mouth every 8 (eight) hours as needed for pain., Disp: 75 tablet, Rfl: 0 .  pantoprazole (PROTONIX) 40 MG tablet, Take 1 tablet (40 mg total) by mouth 2  (two) times daily., Disp: 180 tablet, Rfl: 1 .  sorbitol 70 % solution, Take 15 mLs by mouth daily as needed. (Patient taking differently: Take 15 mLs by mouth daily as needed (for constipation). ), Disp: 3840 mL, Rfl: 3 .  verapamil (CALAN-SR) 180 MG CR tablet, Take 1 tablet (180 mg total) by mouth daily., Disp: 90 tablet, Rfl: 3 .  vitamin B-12 (CYANOCOBALAMIN) 1000 MCG tablet, Take 1 tablet (1,000 mcg total) by mouth daily., Disp: 90 tablet, Rfl: 3  Social History   Tobacco Use  Smoking Status Former Smoker  . Packs/day: 0.50  . Years: 30.00  . Pack years: 15.00  . Types: Cigarettes  . Quit date: 11/05/2010  . Years since quitting: 8.4  Smokeless Tobacco Never Used    Allergies  Allergen Reactions  . Feraheme [Ferumoxytol]     Found unresponsive after starting infusion  . Amlodipine Swelling and Other (See Comments)    Bilateral lower extremity swelling   Objective:   Vitals:   There is no height or weight on file to calculate BMI. Constitutional Well developed. Well nourished.  Vascular Foot warm and well perfused. Capillary refill normal to all digits.   Neurologic Normal speech. Oriented to person, place, and time. Epicritic sensation to light touch grossly present bilaterally.  Dermatologic   Orthopedic: Tenderness to palpation noted about the surgical site.   Radiographs: None Assessment:   1. Ulcer of left foot, with necrosis of bone ([place])   2. Surgery follow-up    Plan:  Patient was evaluated and treated and all questions answered.  S/p foot surgery left -Progressing as expected post-operatively. -XR: None -Bone cultures negative. Swab with growth. Suspected superficial growth only.  -WB Status: WBAT in surgical shoe -Sutures: removed . -Medications: None -Foot redressed.  Return in about 1 week (around 04/17/2019).

## 2019-04-15 ENCOUNTER — Ambulatory Visit: Payer: Medicare Other | Admitting: Podiatry

## 2019-04-18 ENCOUNTER — Ambulatory Visit (INDEPENDENT_AMBULATORY_CARE_PROVIDER_SITE_OTHER): Payer: Self-pay | Admitting: Podiatry

## 2019-04-18 ENCOUNTER — Encounter: Payer: Self-pay | Admitting: Podiatry

## 2019-04-18 ENCOUNTER — Other Ambulatory Visit: Payer: Self-pay

## 2019-04-18 DIAGNOSIS — Z09 Encounter for follow-up examination after completed treatment for conditions other than malignant neoplasm: Secondary | ICD-10-CM

## 2019-04-18 DIAGNOSIS — L97322 Non-pressure chronic ulcer of left ankle with fat layer exposed: Secondary | ICD-10-CM

## 2019-04-18 DIAGNOSIS — E08622 Diabetes mellitus due to underlying condition with other skin ulcer: Secondary | ICD-10-CM

## 2019-04-18 DIAGNOSIS — L97524 Non-pressure chronic ulcer of other part of left foot with necrosis of bone: Secondary | ICD-10-CM

## 2019-04-21 ENCOUNTER — Encounter: Payer: Self-pay | Admitting: Podiatry

## 2019-04-21 NOTE — Progress Notes (Signed)
Subjective:  Patient ID: [name], male    DOB: [birth year],  MRN: [id number]  Chief Complaint  Patient presents with  . Routine Post Op    DOS 03/26/2019 BONE BIOPSY, WOUND DEBRIDEMENT, AND SKIN GRAFT APPLICATION LT   "I guess its doing"    73 y.o. male returns for post-op check.  He states that he is doing well.  His wound site is healing slowly but surely.  He denies any other complaints.  Review of Systems: Negative except as noted in the HPI. Denies N/V/F/Ch.  Past Medical History:  Diagnosis Date  . 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  Excellent symptomatic response to LES Botox injections.   . Anemia 08/20/2018  . AVM (arteriovenous malformation) of colon 08/26/2013   Cecum X 3, without bleeding on colonoscopy February 2015   . B12 deficiency 10/01/2011   Discovered to 2 progressive neurologic pain and dysfunction.  Diagnosis established March 2013.  Requires the following treatment: B12 IM monthly until level normalizes. After that, oral supplementation.   . Cataract   . Chronic venous insufficiency 11/28/2013  . Constipation due to opioid therapy 03/11/2015  . Deep venous thrombosis ([place]) 05/31/2018   Right lower extremity, unprovoked  . Diverticulosis 08/26/2013  . Essential hypertension 06/15/2006  . Family history of malignant neoplasm of gastrointestinal tract 06/30/2013   Father with colon cancer age 13   . Gastroesophageal reflux disease 02/04/2007  . GI bleeding 08/21/2018  . Internal hemorrhoids 08/26/2013  . Left posterior subcapsular cataract 10/03/2013   s/p yag laser capsulotomy 10/03/2013   . 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  Complicated by chronic low back pain and neuropathy.  Nerve conduction study (10/19/11):  Diffusely low motor amplitudes, with primarily involvement of the peroneal and posterior tibial nerves. No evidence of generalized peripheral neuropathy. Findings could be c/w bil multilevel lumbosacral radiculopathies or a primary motor neuropathy.   . Mild aortic valve stenosis 10/09/2006   Echo (12/09/2009): Valve area: 2.03 cm2   . Neuromuscular disorder ([place])   . Neuropathy   . Obesity, Class I, BMI 30-34.9 04/05/2012  . Onychomycosis of toenail 06/05/2014  . Osteoarthritis 11/25/2009   Right knee (medial compartment), right hip, left shoulder   . Peripheral neuropathy   . 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 Trigger little finger of right hand 03/11/2015    Current Outpatient Medications:  .  amoxicillin (AMOXIL) 875 MG tablet, Take 1 tablet (875 mg total) by mouth 2 (two) times daily., Disp: 14 tablet, Rfl: 0 .  COMBIGAN 0.2-0.5 % ophthalmic solution, Place 1 drop into both eyes 2 (two) times daily., Disp: , Rfl:  .  doxycycline (VIBRA-TABS) 100 MG tablet, Take 1 tablet (100 mg total) by mouth 2 (two) times daily., Disp: 14 tablet, Rfl: 0 .  FEROSUL 325 (65 Fe) MG tablet, TAKE 1 TABLET BY MOUTH DAILY., Disp: 30 tablet, Rfl: 2 .  furosemide (LASIX) 40 MG tablet, TAKE 1 TABLET BY MOUTH DAILY., Disp: 30 tablet, Rfl: 11 .  gabapentin (NEURONTIN) 300 MG capsule, Take 2 capsules (600 mg total) by mouth 3 (three) times daily. (Patient taking differently: Take 600 mg by mouth 2 (two) times daily. ), Disp: 540 capsule, Rfl: 3 .  oxyCODONE (OXY  IR/ROXICODONE) 5 MG immediate release tablet, TAKE 1 TABLET BY MOUTH EVERY 8 HOURS AS NEEDED FOR SEVERE PAIN ., Disp: 12 tablet, Rfl: 0 .  oxyCODONE-acetaminophen (PERCOCET) 10-325 MG tablet, Take 1 tablet by mouth every 8 (eight) hours as needed for pain., Disp: 75 tablet, Rfl: 0 .  pantoprazole (PROTONIX) 40 MG tablet, Take 1 tablet (40 mg total) by mouth 2 (two) times daily., Disp: 180 tablet, Rfl: 1  .  sorbitol 70 % solution, Take 15 mLs by mouth daily as needed. (Patient taking differently: Take 15 mLs by mouth daily as needed (for constipation). ), Disp: 3840 mL, Rfl: 3 .  verapamil (CALAN-SR) 180 MG CR tablet, Take 1 tablet (180 mg total) by mouth daily., Disp: 90 tablet, Rfl: 3 .  vitamin B-12 (CYANOCOBALAMIN) 1000 MCG tablet, Take 1 tablet (1,000 mcg total) by mouth daily., Disp: 90 tablet, Rfl: 3  Social History   Tobacco Use  Smoking Status Former Smoker  . Packs/day: 0.50  . Years: 30.00  . Pack years: 15.00  . Types: Cigarettes  . Quit date: 11/05/2010  . Years since quitting: 8.4  Smokeless Tobacco Never Used    Allergies  Allergen Reactions  . Feraheme [Ferumoxytol]     Found unresponsive after starting infusion  . Amlodipine Swelling and Other (See Comments)    Bilateral lower extremity swelling   Objective:  There were no vitals filed for this visit. There is no height or weight on file to calculate BMI. Constitutional Well developed. Well nourished.  Vascular Foot warm and well perfused. Capillary refill normal to all digits.   Neurologic Normal speech. Oriented to person, place, and time. Epicritic sensation to light touch grossly present bilaterally.  Dermatologic Skin healing well without signs of infection. Skin edges well coapted without signs of infection.  Orthopedic: Tenderness to palpation noted about the surgical site.   Radiographs: None Assessment:   1. Ulcer of left foot, with necrosis of bone ([place])   2. Surgery follow-up   3. 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  S/p foot surgery left -The wound is is regressing at this time. -Progressing as expected post-operatively. -XR: None -WB Status: Weightbearing as tolerated in cam boot -Sutures: None. -Medications: None -Foot  redressed.  Return in about 1 week (around 04/25/2019) for POV - no xray-Dr. March Rummage.

## 2019-04-22 ENCOUNTER — Telehealth: Payer: Self-pay | Admitting: *Deleted

## 2019-04-22 ENCOUNTER — Other Ambulatory Visit: Payer: Self-pay | Admitting: Podiatry

## 2019-04-22 NOTE — Telephone Encounter (Signed)
Left message on pt's mobile phone to call with more information concerning his pain. Unable to leave a message on home phone, voicemail box has not been set up.

## 2019-04-22 NOTE — Telephone Encounter (Signed)
Pt's care cor. Calls and states that pt wants the bariatric rollator walker from [employer] it is item # 10215. She states the order must have bariatric rollator and the item # 10215 when it is sent in

## 2019-04-23 LAB — FUNGUS CULTURE WITH STAIN

## 2019-04-23 LAB — FUNGAL ORGANISM REFLEX

## 2019-04-23 LAB — FUNGUS CULTURE RESULT

## 2019-04-23 NOTE — Telephone Encounter (Signed)
Bariatric rolator starts at 300 lbs. Patient's last weight 255 lbs. He will not qualify for a bariatric rolator. [name], BSN, RN-BC

## 2019-04-23 NOTE — Telephone Encounter (Signed)
I am returning a phone cal to the facility where patient resides,they are requesting a Public librarian with items number. My contact number for the patient is [phone], I have left a message for them to call us back so we can see what the patient wants,we do not use item numbers [place], Koby Hartfield C10/28/202010:37 AM

## 2019-04-24 NOTE — Telephone Encounter (Signed)
Community message sent to Jolee Ewing at [facility] explaining patient's care coordinator requesting bariatric rolator, item # [phone] as this fit him best while seated. Awaiting reply. [name], BSN, RN-BC

## 2019-04-25 ENCOUNTER — Other Ambulatory Visit: Payer: Self-pay

## 2019-04-25 ENCOUNTER — Ambulatory Visit (INDEPENDENT_AMBULATORY_CARE_PROVIDER_SITE_OTHER): Payer: Medicare Other | Admitting: Podiatry

## 2019-04-25 DIAGNOSIS — L97524 Non-pressure chronic ulcer of other part of left foot with necrosis of bone: Secondary | ICD-10-CM | POA: Diagnosis not present

## 2019-04-26 NOTE — Progress Notes (Signed)
Subjective:  Patient ID: [name], male    DOB: June 21, 1946,  MRN: [id number]  Chief Complaint  Patient presents with  . Wound Check    Pt states he feels the same. Pt denies fever/nausea/vomiting/chills. Denies drainage. Pt states still having some swelling and pain.    DOS: 03/26/2019 Procedure: Bone biopsies x2, wound debridement, application of skin graft substitute  73 y.o. male returns for post-op check. Doing well, having some pain but denies issues.  Review of Systems: Negative except as noted in the HPI. Denies N/V/F/Ch.  Past Medical History:  Diagnosis Date  . 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  Excellent symptomatic response to LES Botox injections.   . Anemia 08/20/2018  . AVM (arteriovenous malformation) of colon 08/26/2013   Cecum X 3, without bleeding on colonoscopy February 2015   . B12 deficiency 10/01/2011   Discovered to 2 progressive neurologic pain and dysfunction.  Diagnosis established March 2013.  Requires the following treatment: B12 IM monthly until level normalizes. After that, oral supplementation.   . Cataract   . Chronic venous insufficiency 11/28/2013  . Constipation due to opioid therapy 03/11/2015  . Deep venous thrombosis ([place]) 05/31/2018   Right lower extremity, unprovoked  . Diverticulosis 08/26/2013  . Essential hypertension 06/15/2006  . Family history of malignant neoplasm of gastrointestinal tract 06/30/2013   Father with colon cancer age 26   . Gastroesophageal reflux disease 02/04/2007  . GI bleeding 08/21/2018  . Internal hemorrhoids 08/26/2013  . Left posterior subcapsular cataract 10/03/2013   s/p yag laser capsulotomy 10/03/2013   . 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  Complicated by chronic  low back pain and neuropathy.  Nerve conduction study (10/19/11): Diffusely low motor amplitudes, with primarily involvement of the peroneal and posterior tibial nerves. No evidence of generalized peripheral neuropathy. Findings could be c/w bil multilevel lumbosacral radiculopathies or a primary motor neuropathy.   . Mild aortic valve stenosis 10/09/2006   Echo (12/09/2009): Valve area: 2.03 cm2   . Neuromuscular disorder ([place])   . Neuropathy   . Obesity, Class I, BMI 30-34.9 04/05/2012  . Onychomycosis of toenail 06/05/2014  . Osteoarthritis 11/25/2009   Right knee (medial compartment), right hip, left shoulder   . Peripheral neuropathy   . 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 Trigger little finger of right hand 03/11/2015    Current Outpatient Medications:  .  amoxicillin (AMOXIL) 875 MG tablet, Take 1 tablet (875 mg total) by mouth 2 (two) times daily., Disp: 14 tablet, Rfl: 0 .  COMBIGAN 0.2-0.5 % ophthalmic solution, Place 1 drop into both eyes 2 (two) times daily., Disp: , Rfl:  .  doxycycline (VIBRA-TABS) 100 MG tablet, Take 1 tablet (100 mg total) by mouth 2 (two) times daily., Disp: 14 tablet, Rfl: 0 .  FEROSUL 325 (65 Fe) MG tablet, TAKE 1 TABLET BY MOUTH DAILY., Disp: 30 tablet, Rfl: 2 .  furosemide (LASIX) 40 MG tablet, TAKE 1 TABLET BY MOUTH DAILY., Disp: 30 tablet, Rfl: 11 .  gabapentin (NEURONTIN) 300 MG capsule, Take 2 capsules (600 mg total) by mouth 3 (three) times daily. (Patient taking differently: Take 600 mg by mouth 2 (two) times daily. ), Disp: 540 capsule, Rfl: 3 .  oxyCODONE (OXY  IR/ROXICODONE) 5 MG immediate release tablet, TAKE 1 TABLET BY MOUTH EVERY 8 HOURS AS NEEDED FOR SEVERE PAIN, Disp: 12 tablet, Rfl: 0 .  oxyCODONE-acetaminophen (PERCOCET) 10-325 MG tablet, Take 1 tablet by mouth every 8 (eight) hours as needed for pain., Disp: 75 tablet, Rfl: 0 .  pantoprazole (PROTONIX) 40 MG tablet, Take 1 tablet (40 mg  total) by mouth 2 (two) times daily., Disp: 180 tablet, Rfl: 1 .  sorbitol 70 % solution, Take 15 mLs by mouth daily as needed. (Patient taking differently: Take 15 mLs by mouth daily as needed (for constipation). ), Disp: 3840 mL, Rfl: 3 .  verapamil (CALAN-SR) 180 MG CR tablet, Take 1 tablet (180 mg total) by mouth daily., Disp: 90 tablet, Rfl: 3 .  vitamin B-12 (CYANOCOBALAMIN) 1000 MCG tablet, Take 1 tablet (1,000 mcg total) by mouth daily., Disp: 90 tablet, Rfl: 3  Social History   Tobacco Use  Smoking Status Former Smoker  . Packs/day: 0.50  . Years: 30.00  . Pack years: 15.00  . Types: Cigarettes  . Quit date: 11/05/2010  . Years since quitting: 8.4  Smokeless Tobacco Never Used    Allergies  Allergen Reactions  . Feraheme [Ferumoxytol]     Found unresponsive after starting infusion  . Amlodipine Swelling and Other (See Comments)    Bilateral lower extremity swelling   Objective:   There were no vitals filed for this visit. There is no height or weight on file to calculate BMI. Constitutional Well developed. Well nourished.  Vascular Foot warm and well perfused. Capillary refill normal to all digits.   Neurologic Normal speech. Oriented to person, place, and time. Epicritic sensation to light touch grossly present bilaterally.  Dermatologic  wound measuring 2 x 3 at the lateral aspect of the foot with granular base no warmth no erythema no signs of acute infection  Orthopedic: Tenderness to palpation noted about the surgical site.   Radiographs: None Assessment:   1. Ulcer of left foot, with necrosis of bone ([place])    Plan:  Patient was evaluated and treated and all questions answered.  S/p foot surgery left -Wound remains without signs of infection -Dressed with Iodosorb and dry sterile dressing -Iodosorb dispensed to patient for application -We will consider application of wound VAC should the wound fail to continue to heal -Selective debridement as below   Procedure: Selective Debridement of Wound Rationale: Removal of devitalized tissue from the wound to promote healing.  Pre-Debridement Wound Measurements: 2 cm x 3 cm x 0.3 cm  Post-Debridement Wound Measurements: same as pre-debridement. Type of Debridement: sharp selective Tissue Removed: Devitalized soft-tissue Dressing: Dry, sterile, compression dressing. Disposition: Patient tolerated procedure well. Patient to return in 1 week for follow-up.    Return in about 1 week (around 05/02/2019).

## 2019-04-29 ENCOUNTER — Telehealth: Payer: Self-pay | Admitting: Internal Medicine

## 2019-04-29 NOTE — Telephone Encounter (Signed)
[name] is calling checking on the status on the rollator walker [phone]

## 2019-05-01 ENCOUNTER — Ambulatory Visit (INDEPENDENT_AMBULATORY_CARE_PROVIDER_SITE_OTHER): Payer: Medicare Other | Admitting: Podiatry

## 2019-05-01 ENCOUNTER — Other Ambulatory Visit: Payer: Self-pay

## 2019-05-01 DIAGNOSIS — L97524 Non-pressure chronic ulcer of other part of left foot with necrosis of bone: Secondary | ICD-10-CM | POA: Diagnosis not present

## 2019-05-01 NOTE — Telephone Encounter (Signed)
Left a voice mail for patient care taker [name] to call me back [place], [place] C11/5/20202:10 PM

## 2019-05-01 NOTE — Telephone Encounter (Signed)
I left a voice mail message for [name] care person for [name], [place] C11/5/20202:16 PM

## 2019-05-01 NOTE — Progress Notes (Signed)
Subjective:  Patient ID: [name], male    DOB: Aug 22, 1945,  MRN: [id number]  No chief complaint on file.   DOS: 03/26/2019 Procedure: Bone biopsies x2, wound debridement, application of skin graft substitute  73 y.o. male returns for post-op check.  Pain controlled denies new issues with the wound  Review of Systems: Negative except as noted in the HPI. Denies N/V/F/Ch.  Past Medical History:  Diagnosis Date  . 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  Excellent symptomatic response to LES Botox injections.   . Anemia 08/20/2018  . AVM (arteriovenous malformation) of colon 08/26/2013   Cecum X 3, without bleeding on colonoscopy February 2015   . B12 deficiency 10/01/2011   Discovered to 2 progressive neurologic pain and dysfunction.  Diagnosis established March 2013.  Requires the following treatment: B12 IM monthly until level normalizes. After that, oral supplementation.   . Cataract   . Chronic venous insufficiency 11/28/2013  . Constipation due to opioid therapy 03/11/2015  . Deep venous thrombosis ([place]) 05/31/2018   Right lower extremity, unprovoked  . Diverticulosis 08/26/2013  . Essential hypertension 06/15/2006  . Family history of malignant neoplasm of gastrointestinal tract 06/30/2013   Father with colon cancer age 41   . Gastroesophageal reflux disease 02/04/2007  . GI bleeding 08/21/2018  . Internal hemorrhoids 08/26/2013  . Left posterior subcapsular cataract 10/03/2013   s/p yag laser capsulotomy 10/03/2013   . 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  Complicated by chronic low back pain and neuropathy.  Nerve conduction study (10/19/11): Diffusely low motor amplitudes, with primarily involvement of the peroneal and posterior tibial  nerves. No evidence of generalized peripheral neuropathy. Findings could be c/w bil multilevel lumbosacral radiculopathies or a primary motor neuropathy.   . Mild aortic valve stenosis 10/09/2006   Echo (12/09/2009): Valve area: 2.03 cm2   . Neuromuscular disorder ([place])   . Neuropathy   . Obesity, Class I, BMI 30-34.9 04/05/2012  . Onychomycosis of toenail 06/05/2014  . Osteoarthritis 11/25/2009   Right knee (medial compartment), right hip, left shoulder   . Peripheral neuropathy   . 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 Trigger little finger of right hand 03/11/2015    Current Outpatient Medications:  .  amoxicillin (AMOXIL) 875 MG tablet, Take 1 tablet (875 mg total) by mouth 2 (two) times daily., Disp: 14 tablet, Rfl: 0 .  COMBIGAN 0.2-0.5 % ophthalmic solution, Place 1 drop into both eyes 2 (two) times daily., Disp: , Rfl:  .  doxycycline (VIBRA-TABS) 100 MG tablet, Take 1 tablet (100 mg total) by mouth 2 (two) times daily., Disp: 14 tablet, Rfl: 0 .  FEROSUL 325 (65 Fe) MG tablet, TAKE 1 TABLET BY MOUTH DAILY., Disp: 30 tablet, Rfl: 2 .  furosemide (LASIX) 40 MG tablet, TAKE 1 TABLET BY MOUTH DAILY., Disp: 30 tablet, Rfl: 11 .  gabapentin (NEURONTIN) 300 MG capsule, Take 2 capsules (600 mg total) by mouth 3 (three) times daily. (Patient taking differently: Take 600 mg by mouth 2 (two) times daily. ), Disp: 540 capsule, Rfl: 3 .  oxyCODONE (OXY IR/ROXICODONE) 5 MG immediate release tablet, TAKE 1 TABLET BY MOUTH EVERY 8 HOURS AS NEEDED FOR SEVERE PAIN, Disp: 12 tablet, Rfl: 0 .  oxyCODONE-acetaminophen (  PERCOCET) 10-325 MG tablet, Take 1 tablet by mouth every 8 (eight) hours as needed for pain., Disp: 75 tablet, Rfl: 0 .  pantoprazole (PROTONIX) 40 MG tablet, Take 1 tablet (40 mg total) by mouth 2 (two) times daily., Disp: 180 tablet, Rfl: 1 .  sorbitol 70 % solution, Take 15 mLs by mouth daily as needed. (Patient taking differently: Take  15 mLs by mouth daily as needed (for constipation). ), Disp: 3840 mL, Rfl: 3 .  verapamil (CALAN-SR) 180 MG CR tablet, Take 1 tablet (180 mg total) by mouth daily., Disp: 90 tablet, Rfl: 3 .  vitamin B-12 (CYANOCOBALAMIN) 1000 MCG tablet, Take 1 tablet (1,000 mcg total) by mouth daily., Disp: 90 tablet, Rfl: 3  Social History   Tobacco Use  Smoking Status Former Smoker  . Packs/day: 0.50  . Years: 30.00  . Pack years: 15.00  . Types: Cigarettes  . Quit date: 11/05/2010  . Years since quitting: 8.4  Smokeless Tobacco Never Used    Allergies  Allergen Reactions  . Feraheme [Ferumoxytol]     Found unresponsive after starting infusion  . Amlodipine Swelling and Other (See Comments)    Bilateral lower extremity swelling   Objective:   There were no vitals filed for this visit. There is no height or weight on file to calculate BMI. Constitutional Well developed. Well nourished.  Vascular Foot warm and well perfused. Capillary refill normal to all digits.   Neurologic Normal speech. Oriented to person, place, and time. Epicritic sensation to light touch grossly present bilaterally.  Dermatologic  wound measuring 3 x 3 x 0.3 to the left foot with 80% granular 20% fibrous base without warmth erythema signs of acute infection  Orthopedic: Tenderness to palpation noted about the surgical site.   Radiographs: None Assessment:   1. Ulcer of left foot, with necrosis of bone ([place])    Plan:  Patient was evaluated and treated and all questions answered.  S/p foot surgery left -Wound remains without signs of infection -Dressed with Prisma and DSD. -Will trial wound VAC if wound remains stalled. -Selective debridement as below  Procedure: Selective Debridement of Wound Rationale: Removal of devitalized tissue from the wound to promote healing.  Pre-Debridement Wound Measurements: 3 cm x 2 cm x 0.3 cm  Post-Debridement Wound Measurements: same as pre-debridement. Type of  Debridement: sharp selective Tissue Removed: Devitalized soft-tissue Dressing: Dry, sterile, compression dressing. Disposition: Patient tolerated procedure well. Patient to return in 1 week for follow-up.    Return in about 1 week (around 05/08/2019).

## 2019-05-01 NOTE — Telephone Encounter (Signed)
Catron, Germain Osgood, Orvis Brill, RN; [place], Stanford Breed, [place]; [place], [place] C, [place]        The Dr can write in the notes explaining due to his height and body structure he needs a bariatric rollator. But I think he will have to private pay for it and then he can file it with his insurance himself.

## 2019-05-06 NOTE — Telephone Encounter (Signed)
I returned phone call to [name] about Rolator walker that [name] has an appointmentt and they would be able to discuss the walker with the doctor face to face,she understood.

## 2019-05-08 LAB — ACID FAST CULTURE WITH REFLEXED SENSITIVITIES (MYCOBACTERIA)
Acid Fast Culture: NEGATIVE
Acid Fast Culture: NEGATIVE

## 2019-05-09 ENCOUNTER — Other Ambulatory Visit: Payer: Self-pay

## 2019-05-09 ENCOUNTER — Ambulatory Visit (INDEPENDENT_AMBULATORY_CARE_PROVIDER_SITE_OTHER): Payer: Medicare Other | Admitting: Podiatry

## 2019-05-09 DIAGNOSIS — L97524 Non-pressure chronic ulcer of other part of left foot with necrosis of bone: Secondary | ICD-10-CM | POA: Diagnosis not present

## 2019-05-16 ENCOUNTER — Ambulatory Visit (INDEPENDENT_AMBULATORY_CARE_PROVIDER_SITE_OTHER): Payer: Medicare Other | Admitting: Podiatry

## 2019-05-16 ENCOUNTER — Other Ambulatory Visit: Payer: Self-pay

## 2019-05-16 DIAGNOSIS — L97524 Non-pressure chronic ulcer of other part of left foot with necrosis of bone: Secondary | ICD-10-CM | POA: Diagnosis not present

## 2019-05-23 ENCOUNTER — Other Ambulatory Visit: Payer: Self-pay | Admitting: Gastroenterology

## 2019-05-23 DIAGNOSIS — D500 Iron deficiency anemia secondary to blood loss (chronic): Secondary | ICD-10-CM

## 2019-05-26 NOTE — Telephone Encounter (Signed)
Does he need to have repeat labs, before refills?

## 2019-05-27 NOTE — Telephone Encounter (Signed)
Vivien Rota,    I am not sure he needs the iron any longer. Last Hgb on file in Sept was normal.  I'll copy this to his PCP and ask them to check CBC and iron studies and they can make a decision on the iron.  He has a visit with them on 12/3   __________________________________   Dr. Heber [place],   Please see above.   Wilfrid Lund, MD    [facility]

## 2019-05-27 NOTE — Telephone Encounter (Signed)
Thanks, will do!

## 2019-05-29 ENCOUNTER — Other Ambulatory Visit: Payer: Self-pay

## 2019-05-29 ENCOUNTER — Encounter: Payer: Self-pay | Admitting: Internal Medicine

## 2019-05-29 ENCOUNTER — Ambulatory Visit (INDEPENDENT_AMBULATORY_CARE_PROVIDER_SITE_OTHER): Payer: Medicare Other | Admitting: Internal Medicine

## 2019-05-29 VITALS — BP 122/61 | HR 68 | Temp 98.9°F | Ht 74.0 in | Wt 254.0 lb

## 2019-05-29 DIAGNOSIS — Z862 Personal history of diseases of the blood and blood-forming organs and certain disorders involving the immune mechanism: Secondary | ICD-10-CM | POA: Diagnosis not present

## 2019-05-29 DIAGNOSIS — Z79899 Other long term (current) drug therapy: Secondary | ICD-10-CM

## 2019-05-29 DIAGNOSIS — G8929 Other chronic pain: Secondary | ICD-10-CM | POA: Diagnosis not present

## 2019-05-29 DIAGNOSIS — I10 Essential (primary) hypertension: Secondary | ICD-10-CM

## 2019-05-29 DIAGNOSIS — M1711 Unilateral primary osteoarthritis, right knee: Secondary | ICD-10-CM | POA: Diagnosis not present

## 2019-05-29 DIAGNOSIS — D500 Iron deficiency anemia secondary to blood loss (chronic): Secondary | ICD-10-CM | POA: Diagnosis not present

## 2019-05-29 DIAGNOSIS — Z8719 Personal history of other diseases of the digestive system: Secondary | ICD-10-CM

## 2019-05-29 DIAGNOSIS — Z79891 Long term (current) use of opiate analgesic: Secondary | ICD-10-CM

## 2019-05-29 MED ORDER — OXYCODONE-ACETAMINOPHEN 10-325 MG PO TABS: 1.0000 | ORAL_TABLET | Freq: Three times a day (TID) | ORAL | 0 refills | Status: DC | PRN

## 2019-05-30 ENCOUNTER — Ambulatory Visit (INDEPENDENT_AMBULATORY_CARE_PROVIDER_SITE_OTHER): Payer: Medicare Other | Admitting: Podiatry

## 2019-05-30 DIAGNOSIS — L97524 Non-pressure chronic ulcer of other part of left foot with necrosis of bone: Secondary | ICD-10-CM

## 2019-05-30 LAB — CBC WITH DIFFERENTIAL/PLATELET
Basophils Absolute: 0 10*3/uL (ref 0.0–0.2)
Basos: 1 %
EOS (ABSOLUTE): 0.1 10*3/uL (ref 0.0–0.4)
Eos: 1 %
Hematocrit: 41 % (ref 37.5–51.0)
Hemoglobin: 14 g/dL (ref 13.0–17.7)
Immature Grans (Abs): 0 10*3/uL (ref 0.0–0.1)
Immature Granulocytes: 0 %
Lymphocytes Absolute: 1.4 10*3/uL (ref 0.7–3.1)
Lymphs: 27 %
MCH: 30.8 pg (ref 26.6–33.0)
MCHC: 34.1 g/dL (ref 31.5–35.7)
MCV: 90 fL (ref 79–97)
Monocytes Absolute: 0.5 10*3/uL (ref 0.1–0.9)
Monocytes: 10 %
Neutrophils Absolute: 3.1 10*3/uL (ref 1.4–7.0)
Neutrophils: 61 %
Platelets: 194 10*3/uL (ref 150–450)
RBC: 4.54 x10E6/uL (ref 4.14–5.80)
RDW: 15.9 % — ABNORMAL HIGH (ref 11.6–15.4)
WBC: 5.1 10*3/uL (ref 3.4–10.8)

## 2019-05-30 LAB — IRON AND TIBC
Iron Saturation: 39 % (ref 15–55)
Iron: 110 ug/dL (ref 38–169)
Total Iron Binding Capacity: 284 ug/dL (ref 250–450)
UIBC: 174 ug/dL (ref 111–343)

## 2019-05-30 LAB — FERRITIN: Ferritin: 134 ng/mL (ref 30–400)

## 2019-05-30 NOTE — Progress Notes (Signed)
Subjective:  Patient ID: [name], male    DOB: 1946/02/18,  MRN: [id number]  Chief Complaint  Patient presents with  . Follow-up    ulcer of the left foot     DOS: 03/26/2019 Procedure: Bone biopsies x2, wound debridement, application of skin graft substitute  73 y.o. male returns for post-op check.  Doing well thinks the pain is improving.  Review of Systems: Negative except as noted in the HPI. Denies N/V/F/Ch.  Past Medical History:  Diagnosis Date  . 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  Excellent symptomatic response to LES Botox injections.   . Anemia 08/20/2018  . AVM (arteriovenous malformation) of colon 08/26/2013   Cecum X 3, without bleeding on colonoscopy February 2015   . B12 deficiency 10/01/2011   Discovered to 2 progressive neurologic pain and dysfunction.  Diagnosis established March 2013.  Requires the following treatment: B12 IM monthly until level normalizes. After that, oral supplementation.   . Cataract   . Chronic venous insufficiency 11/28/2013  . Constipation due to opioid therapy 03/11/2015  . Deep venous thrombosis ([place]) 05/31/2018   Right lower extremity, unprovoked  . Diverticulosis 08/26/2013  . Essential hypertension 06/15/2006  . Family history of malignant neoplasm of gastrointestinal tract 06/30/2013   Father with colon cancer age 12   . Gastroesophageal reflux disease 02/04/2007  . GI bleeding 08/21/2018  . Internal hemorrhoids 08/26/2013  . Left posterior subcapsular cataract 10/03/2013   s/p yag laser capsulotomy 10/03/2013   . 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  Complicated by chronic low back pain and neuropathy.  Nerve conduction study (10/19/11): Diffusely low motor amplitudes, with primarily  involvement of the peroneal and posterior tibial nerves. No evidence of generalized peripheral neuropathy. Findings could be c/w bil multilevel lumbosacral radiculopathies or a primary motor neuropathy.   . Mild aortic valve stenosis 10/09/2006   Echo (12/09/2009): Valve area: 2.03 cm2   . Neuromuscular disorder ([place])   . Neuropathy   . Obesity, Class I, BMI 30-34.9 04/05/2012  . Onychomycosis of toenail 06/05/2014  . Osteoarthritis 11/25/2009   Right knee (medial compartment), right hip, left shoulder   . Peripheral neuropathy   . 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 Trigger little finger of right hand 03/11/2015    Current Outpatient Medications:  .  amoxicillin (AMOXIL) 875 MG tablet, Take 1 tablet (875 mg total) by mouth 2 (two) times daily., Disp: 14 tablet, Rfl: 0 .  COMBIGAN 0.2-0.5 % ophthalmic solution, Place 1 drop into both eyes 2 (two) times daily., Disp: , Rfl:  .  doxycycline (VIBRA-TABS) 100 MG tablet, Take 1 tablet (100 mg total) by mouth 2 (two) times daily., Disp: 14 tablet, Rfl: 0 .  FEROSUL 325 (65 Fe) MG tablet, TAKE 1 TABLET BY MOUTH DAILY., Disp: 30 tablet, Rfl: 2 .  furosemide (LASIX) 40 MG tablet, TAKE 1 TABLET BY MOUTH DAILY., Disp: 30 tablet, Rfl: 11 .  gabapentin (NEURONTIN) 300 MG capsule, Take 2 capsules (600 mg total) by mouth 3 (three) times daily. (Patient taking differently: Take 600 mg by mouth 2 (two) times daily. ), Disp: 540 capsule, Rfl: 3 .  oxyCODONE (OXY IR/ROXICODONE) 5 MG immediate release tablet, TAKE 1 TABLET BY MOUTH EVERY 8 HOURS  AS NEEDED FOR SEVERE PAIN, Disp: 12 tablet, Rfl: 0 .  pantoprazole (PROTONIX) 40 MG tablet, Take 1 tablet (40 mg total) by mouth 2 (two) times daily., Disp: 180 tablet, Rfl: 1 .  sorbitol 70 % solution, Take 15 mLs by mouth daily as needed. (Patient taking differently: Take 15 mLs by mouth daily as needed (for constipation). ), Disp: 3840 mL, Rfl: 3 .  verapamil  (CALAN-SR) 180 MG CR tablet, Take 1 tablet (180 mg total) by mouth daily., Disp: 90 tablet, Rfl: 3 .  vitamin B-12 (CYANOCOBALAMIN) 1000 MCG tablet, Take 1 tablet (1,000 mcg total) by mouth daily., Disp: 90 tablet, Rfl: 3 .  oxyCODONE-acetaminophen (PERCOCET) 10-325 MG tablet, Take 1 tablet by mouth every 8 (eight) hours as needed for pain., Disp: 75 tablet, Rfl: 0 .  [START ON 06/28/2019] oxyCODONE-acetaminophen (PERCOCET) 10-325 MG tablet, Take 1 tablet by mouth every 8 (eight) hours as needed for pain., Disp: 75 tablet, Rfl: 0  Social History   Tobacco Use  Smoking Status Current Some Day Smoker  . Packs/day: 0.20  . Years: 30.00  . Pack years: 6.00  . Types: Cigarettes  . Last attempt to quit: 11/05/2010  . Years since quitting: 8.5  Smokeless Tobacco Never Used    Allergies  Allergen Reactions  . Feraheme [Ferumoxytol]     Found unresponsive after starting infusion  . Amlodipine Swelling and Other (See Comments)    Bilateral lower extremity swelling   Objective:   There were no vitals filed for this visit. There is no height or weight on file to calculate BMI. Constitutional Well developed. Well nourished.  Vascular Foot warm and well perfused. Capillary refill normal to all digits.   Neurologic Normal speech. Oriented to person, place, and time. Epicritic sensation to light touch grossly present bilaterally.  Dermatologic Wound left lateral foot measuring 2x2 fibroganular base without signs of infection.  Orthopedic: Tenderness to palpation noted about the surgical site.   Radiographs: None Assessment:   1. Ulcer of left foot, with necrosis of bone ([place])    Plan:  Patient was evaluated and treated and all questions answered.  S/p foot surgery left -Wound remains without signs of infection -Dressed with Prisma and DSD. -Will trial wound VAC if wound remains stalled. -Selective debridement as below  Procedure: Selective Debridement of Wound Rationale: Removal of  devitalized tissue from the wound to promote healing.  Pre-Debridement Wound Measurements: 2 cm x 2 cm x 0.2 cm  Post-Debridement Wound Measurements: same as pre-debridement. Type of Debridement: sharp selective Tissue Removed: Devitalized soft-tissue Dressing: Dry, sterile, compression dressing. Disposition: Patient tolerated procedure well. Patient to return in 1 week for follow-up.      Return in about 2 weeks (around 05/30/2019) for Wound Care.

## 2019-06-02 ENCOUNTER — Other Ambulatory Visit: Payer: Self-pay | Admitting: Gastroenterology

## 2019-06-02 DIAGNOSIS — D500 Iron deficiency anemia secondary to blood loss (chronic): Secondary | ICD-10-CM

## 2019-06-03 ENCOUNTER — Telehealth: Payer: Self-pay | Admitting: Internal Medicine

## 2019-06-03 NOTE — Assessment & Plan Note (Addendum)
HPI: continues to be bothered by chronic right knee pain 2/2 osteoarthritis.  He has been on chronic opioid therapy with his last pcp for this without recent red flags, his therapy was interrupted somewhat due to multiple hospitalizations.  However he was traditionally given 75 tablets of percocet per month, he request return to 3 tablets a day which best controlls his pain.  A: Chronic pain 2/2 right knee OA  P: Continue percocet 10-325 TIDPRN, #75 per month No check of U Tox today given he has not taken medication in last few days, has intermittently had negative UTox screen, want to make sure this is obtained and corroborated with history, will expect to obtain Utox on next visit.  Additionally I have ordered him DME bariatric rollator walker.  Due to his OA of the right knee as well as current ulcer of the left foot he is at hight risk of falls during ADLs like bathing, tolieting, dressing, and eating. Additionally while his weight is 254 lbs, his height is 6\' 2" , I believe he would be best suited with a bariatric rollator.

## 2019-06-03 NOTE — Telephone Encounter (Signed)
Pt's caregiver calling to f/u with the patient's DME request for a Bariatric Rollator Walker with a Seat.

## 2019-06-03 NOTE — Progress Notes (Signed)
  Subjective:  HPI: [name] is a 73 y.o. male who presents for f/u, IDA, evaluation for rollator, chronic pain right knee  Please see Assessment and Plan below for the status of his chronic medical problems.  Review of Systems: Review of Systems  Constitutional: Negative for chills and fever.  Respiratory: Negative for cough.   Cardiovascular: Negative for chest pain.  Gastrointestinal: Negative for abdominal pain, blood in stool and melena.  Genitourinary: Negative for dysuria.  Musculoskeletal: Positive for joint pain. Negative for myalgias.    Objective:  Physical Exam: Vitals:   05/29/19 1034  BP: 122/61  Pulse: 68  Temp: 98.9 F (37.2 C)  TempSrc: Oral  SpO2: 99%  Weight: 254 lb (115.2 kg)  Height: 6\' 2"  (1.88 m)   Body mass index is 32.61 kg/m. Physical Exam Vitals signs and nursing note reviewed.  Constitutional:      Appearance: Normal appearance. He is obese.  Cardiovascular:     Rate and Rhythm: Normal rate and regular rhythm.  Pulmonary:     Effort: Pulmonary effort is normal.     Breath sounds: Normal breath sounds.  Abdominal:     General: Abdomen is flat.     Palpations: Abdomen is soft.  Musculoskeletal:     Right knee: He exhibits no effusion, no LCL laxity and no MCL laxity. Tenderness found. Medial joint line and lateral joint line tenderness noted.     Left knee: He exhibits no effusion. Tenderness found. Medial joint line tenderness noted. No lateral joint line tenderness noted.     Comments: Left foot in medical boot    Assessment & Plan:  See Encounters Tab for problem based charting.  Medications Ordered Meds ordered this encounter  Medications  . oxyCODONE-acetaminophen (PERCOCET) 10-325 MG tablet    Sig: Take 1 tablet by mouth every 8 (eight) hours as needed for pain.    Dispense:  75 tablet    Refill:  0    Rx 1/2  . oxyCODONE-acetaminophen (PERCOCET) 10-325 MG tablet    Sig: Take 1 tablet by mouth every 8 (eight) hours  as needed for pain.    Dispense:  75 tablet    Refill:  0    Rx 2/2   Other Orders Orders Placed This Encounter  Procedures  . CBC with Diff  . Iron and IBC [id number])  . Ferritin   Follow Up: Return in about 2 months (around 07/30/2019).

## 2019-06-03 NOTE — Assessment & Plan Note (Signed)
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  A: History of IDA  P: Repeat CBC and iron studies today,  Have returned normal, I have discussed he may discontinue iron supplementation.

## 2019-06-03 NOTE — Assessment & Plan Note (Signed)
HPI: No issues, taking medications as prescribed  A: Essential HTN well controlled  P: Continue Verapamil 180mg  daily

## 2019-06-04 NOTE — Telephone Encounter (Signed)
Done

## 2019-06-04 NOTE — Telephone Encounter (Signed)
Catron, Germain Osgood, Orvis Brill, RN; [place], Stanford Breed, [place]; [place], [place] C, [place]        got it

## 2019-06-04 NOTE — Telephone Encounter (Signed)
Attempted to relay info regarding rolator to [employer]. No answer and no VM set up. Order has been pulled by [employer]. Unfortunately, there is no guarantee that insurance will cover Bariatric Rolator as patient is < 300 lbs. [name], BSN, RN-BC

## 2019-06-04 NOTE — Addendum Note (Signed)
Addended by: [name] C on: 06/04/2019 10:50 AM   Modules accepted: Orders

## 2019-06-05 ENCOUNTER — Telehealth: Payer: Self-pay | Admitting: *Deleted

## 2019-06-05 DIAGNOSIS — M1711 Unilateral primary osteoarthritis, right knee: Secondary | ICD-10-CM | POA: Diagnosis not present

## 2019-06-05 DIAGNOSIS — R1013 Epigastric pain: Secondary | ICD-10-CM | POA: Diagnosis not present

## 2019-06-05 DIAGNOSIS — M4647 Discitis, unspecified, lumbosacral region: Secondary | ICD-10-CM | POA: Diagnosis not present

## 2019-06-05 NOTE — Telephone Encounter (Signed)
Faxed 05/30/2019 clinicals, required registration form and demographics to [employer] - [name].

## 2019-06-05 NOTE — Telephone Encounter (Signed)
Dr. March Rummage requested paperwork for [facility] wound vac to D. Ezequiel Ganser for pt appt tomorrow 3:00pm.

## 2019-06-05 NOTE — Progress Notes (Signed)
Subjective:  Patient ID: [name], male    DOB: [birth year],  MRN: [id number]  Chief Complaint  Patient presents with  . Routine Post Op    DOS 9.30.2020 BONE BIOPSY, WOUND DEBRIDEMENT AND SKIN GRAFT APPLICATION LT. Pt states no changes and no concerns. Denies fever/nausea/vomiting/chills.    DOS: 03/26/2019 Procedure: Bone biopsies x2, wound debridement, application of skin graft substitute  73 y.o. male returns for post-op check. Hx confirmed with patient.  Review of Systems: Negative except as noted in the HPI. Denies N/V/F/Ch.  Past Medical History:  Diagnosis Date  . 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  Excellent symptomatic response to LES Botox injections.   . Anemia 08/20/2018  . AVM (arteriovenous malformation) of colon 08/26/2013   Cecum X 3, without bleeding on colonoscopy February 2015   . B12 deficiency 10/01/2011   Discovered to 2 progressive neurologic pain and dysfunction.  Diagnosis established March 2013.  Requires the following treatment: B12 IM monthly until level normalizes. After that, oral supplementation.   . Cataract   . Chronic venous insufficiency 11/28/2013  . Constipation due to opioid therapy 03/11/2015  . Deep venous thrombosis ([place]) 05/31/2018   Right lower extremity, unprovoked  . Diverticulosis 08/26/2013  . Essential hypertension 06/15/2006  . Family history of malignant neoplasm of gastrointestinal tract 06/30/2013   Father with colon cancer age 14   . Gastroesophageal reflux disease 02/04/2007  . GI bleeding 08/21/2018  . Internal hemorrhoids 08/26/2013  . Left posterior subcapsular cataract 10/03/2013   s/p yag laser capsulotomy 10/03/2013   . 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  Complicated by chronic  low back pain and neuropathy.  Nerve conduction study (10/19/11): Diffusely low motor amplitudes, with primarily involvement of the peroneal and posterior tibial nerves. No evidence of generalized peripheral neuropathy. Findings could be c/w bil multilevel lumbosacral radiculopathies or a primary motor neuropathy.   . Mild aortic valve stenosis 10/09/2006   Echo (12/09/2009): Valve area: 2.03 cm2   . Neuromuscular disorder ([place])   . Neuropathy   . Obesity, Class I, BMI 30-34.9 04/05/2012  . Onychomycosis of toenail 06/05/2014  . Osteoarthritis 11/25/2009   Right knee (medial compartment), right hip, left shoulder   . Peripheral neuropathy   . 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 Trigger little finger of right hand 03/11/2015    Current Outpatient Medications:  .  amoxicillin (AMOXIL) 875 MG tablet, Take 1 tablet (875 mg total) by mouth 2 (two) times daily., Disp: 14 tablet, Rfl: 0 .  COMBIGAN 0.2-0.5 % ophthalmic solution, Place 1 drop into both eyes 2 (two) times daily., Disp: , Rfl:  .  doxycycline (VIBRA-TABS) 100 MG tablet, Take 1 tablet (100 mg total) by mouth 2 (two) times daily., Disp: 14 tablet, Rfl: 0 .  furosemide (LASIX) 40 MG tablet, TAKE 1 TABLET BY MOUTH DAILY., Disp: 30 tablet, Rfl: 11 .  gabapentin (NEURONTIN) 300 MG capsule, Take 2 capsules (600 mg total) by mouth 3 (three) times daily. (Patient taking differently: Take 600 mg by mouth 2 (two) times daily. ), Disp: 540 capsule, Rfl: 3 .  LUMIGAN 0.01 % SOLN, , Disp: , Rfl:  .  oxyCODONE (OXY IR/ROXICODONE) 5 MG immediate release tablet, TAKE 1 TABLET BY  MOUTH EVERY 8 HOURS AS NEEDED FOR SEVERE PAIN, Disp: 12 tablet, Rfl: 0 .  oxyCODONE-acetaminophen (PERCOCET) 10-325 MG tablet, Take 1 tablet by mouth every 8 (eight) hours as needed for pain., Disp: 75 tablet, Rfl: 0 .  [START ON 06/28/2019] oxyCODONE-acetaminophen (PERCOCET) 10-325 MG tablet, Take 1 tablet by mouth every 8  (eight) hours as needed for pain., Disp: 75 tablet, Rfl: 0 .  pantoprazole (PROTONIX) 40 MG tablet, Take 1 tablet (40 mg total) by mouth 2 (two) times daily., Disp: 180 tablet, Rfl: 1 .  sorbitol 70 % solution, Take 15 mLs by mouth daily as needed. (Patient taking differently: Take 15 mLs by mouth daily as needed (for constipation). ), Disp: 3840 mL, Rfl: 3 .  verapamil (CALAN-SR) 180 MG CR tablet, Take 1 tablet (180 mg total) by mouth daily., Disp: 90 tablet, Rfl: 3 .  vitamin B-12 (CYANOCOBALAMIN) 1000 MCG tablet, Take 1 tablet (1,000 mcg total) by mouth daily., Disp: 90 tablet, Rfl: 3  Social History   Tobacco Use  Smoking Status Current Some Day Smoker  . Packs/day: 0.20  . Years: 30.00  . Pack years: 6.00  . Types: Cigarettes  . Last attempt to quit: 11/05/2010  . Years since quitting: 8.5  Smokeless Tobacco Never Used    Allergies  Allergen Reactions  . Feraheme [Ferumoxytol]     Found unresponsive after starting infusion  . Amlodipine Swelling and Other (See Comments)    Bilateral lower extremity swelling   Objective:   There were no vitals filed for this visit. There is no height or weight on file to calculate BMI. Constitutional Well developed. Well nourished.  Vascular Foot warm and well perfused. Capillary refill normal to all digits.   Neurologic Normal speech. Oriented to person, place, and time. Epicritic sensation to light touch grossly present bilaterally.  Dermatologic Wound left lateral foot measuring 2x2 fibroganular base without signs of infection.  Orthopedic: Tenderness to palpation noted about the surgical site.   Radiographs: None Assessment:   No diagnosis found. Plan:  Patient was evaluated and treated and all questions answered.  S/p foot surgery left -Wound remains without signs of infection -Dressed with Prisma and DSD. -Would still slow to heel with depth. Would benefit from [facility] application. -Selective debridement as below   Procedure: Excisional Debridement of Wound Rationale: Removal of non-viable soft tissue from the wound to promote healing.  Anesthesia: none Pre-Debridement Wound Measurements: 2 cm x 2.5 cm x 0.4 cm  Post-Debridement Wound Measurements: 2 cm x 3 cm x 0.4 cm  Type of Debridement: Sharp Excisional Tissue Removed: Non-viable soft tissue Depth of Debridement: subcutaneous tissue. Technique: Sharp excisional debridement to bleeding, viable wound base.  Dressing: Dry, sterile, compression dressing. Disposition: Patient tolerated procedure well. Patient to return in 1 week for follow-up.       No follow-ups on file.

## 2019-06-06 ENCOUNTER — Other Ambulatory Visit: Payer: Self-pay

## 2019-06-06 ENCOUNTER — Ambulatory Visit (INDEPENDENT_AMBULATORY_CARE_PROVIDER_SITE_OTHER): Payer: Medicare Other | Admitting: Podiatry

## 2019-06-06 VITALS — Temp 97.2°F

## 2019-06-06 DIAGNOSIS — L97524 Non-pressure chronic ulcer of other part of left foot with necrosis of bone: Secondary | ICD-10-CM

## 2019-06-08 NOTE — Progress Notes (Signed)
Subjective:  Patient ID: [name], male    DOB: 1946-03-29,  MRN: [id number]  Chief Complaint  Patient presents with  . Routine Post Op    DOS 03/26/2019 BONE BIOPSY, WOUND DEBRIDEMENT AND SKIN GRAFT APPLICATION LT. Pt states no concerns. Denies fever/chills/nausea/vomiting.    DOS: 03/26/2019 Procedure: Bone biopsies x2, wound debridement, application of skin graft substitute  73 y.o. male returns for post-op check. Hx confirmed with patient. Thinks the wound is doing better.  Has not changed the dressing since last visit.  Denies interval changes  Review of Systems: Negative except as noted in the HPI. Denies N/V/F/Ch.  Past Medical History:  Diagnosis Date  . 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  Excellent symptomatic response to LES Botox injections.   . Anemia 08/20/2018  . AVM (arteriovenous malformation) of colon 08/26/2013   Cecum X 3, without bleeding on colonoscopy February 2015   . B12 deficiency 10/01/2011   Discovered to 2 progressive neurologic pain and dysfunction.  Diagnosis established March 2013.  Requires the following treatment: B12 IM monthly until level normalizes. After that, oral supplementation.   . Cataract   . Chronic venous insufficiency 11/28/2013  . Constipation due to opioid therapy 03/11/2015  . Deep venous thrombosis ([place]) 05/31/2018   Right lower extremity, unprovoked  . Diverticulosis 08/26/2013  . Essential hypertension 06/15/2006  . Family history of malignant neoplasm of gastrointestinal tract 06/30/2013   Father with colon cancer age 81   . Gastroesophageal reflux disease 02/04/2007  . GI bleeding 08/21/2018  . Internal hemorrhoids 08/26/2013  . Left posterior subcapsular cataract 10/03/2013   s/p yag laser capsulotomy 10/03/2013   . 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 Lumbar  stenosis with neurogenic claudication 06/16/2006   Moderate: L2 through L4.  Complicated by chronic low back pain and neuropathy.  Nerve conduction study (10/19/11): Diffusely low motor amplitudes, with primarily involvement of the peroneal and posterior tibial nerves. No evidence of generalized peripheral neuropathy. Findings could be c/w bil multilevel lumbosacral radiculopathies or a primary motor neuropathy.   . Mild aortic valve stenosis 10/09/2006   Echo (12/09/2009): Valve area: 2.03 cm2   . Neuromuscular disorder ([place])   . Neuropathy   . Obesity, Class I, BMI 30-34.9 04/05/2012  . Onychomycosis of toenail 06/05/2014  . Osteoarthritis 11/25/2009   Right knee (medial compartment), right hip, left shoulder   . Peripheral neuropathy   . 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 Trigger little finger of right hand 03/11/2015    Current Outpatient Medications:  .  amoxicillin (AMOXIL) 875 MG tablet, Take 1 tablet (875 mg total) by mouth 2 (two) times daily., Disp: 14 tablet, Rfl: 0 .  COMBIGAN 0.2-0.5 % ophthalmic solution, Place 1 drop into both eyes 2 (two) times daily., Disp: , Rfl:  .  doxycycline (VIBRA-TABS) 100 MG tablet, Take 1 tablet (100 mg total) by mouth 2 (two) times daily., Disp: 14 tablet, Rfl: 0 .  furosemide (LASIX) 40 MG tablet, TAKE 1 TABLET BY MOUTH DAILY., Disp: 30 tablet, Rfl: 11 .  gabapentin (NEURONTIN) 300 MG capsule, Take 2 capsules (600 mg total) by mouth 3 (three) times daily. (Patient taking differently: Take 600 mg by mouth 2 (two) times daily. ), Disp: 540 capsule, Rfl: 3 .  LUMIGAN 0.01 % SOLN, , Disp: ,  Rfl:  .  oxyCODONE (OXY IR/ROXICODONE) 5 MG immediate release tablet, TAKE 1 TABLET BY MOUTH EVERY 8 HOURS AS NEEDED FOR SEVERE PAIN, Disp: 12 tablet, Rfl: 0 .  oxyCODONE-acetaminophen (PERCOCET) 10-325 MG tablet, Take 1 tablet by mouth every 8 (eight) hours as needed for pain., Disp: 75 tablet, Rfl: 0 .  [START  ON 06/28/2019] oxyCODONE-acetaminophen (PERCOCET) 10-325 MG tablet, Take 1 tablet by mouth every 8 (eight) hours as needed for pain., Disp: 75 tablet, Rfl: 0 .  pantoprazole (PROTONIX) 40 MG tablet, Take 1 tablet (40 mg total) by mouth 2 (two) times daily., Disp: 180 tablet, Rfl: 1 .  sorbitol 70 % solution, Take 15 mLs by mouth daily as needed. (Patient taking differently: Take 15 mLs by mouth daily as needed (for constipation). ), Disp: 3840 mL, Rfl: 3 .  verapamil (CALAN-SR) 180 MG CR tablet, Take 1 tablet (180 mg total) by mouth daily., Disp: 90 tablet, Rfl: 3 .  vitamin B-12 (CYANOCOBALAMIN) 1000 MCG tablet, Take 1 tablet (1,000 mcg total) by mouth daily., Disp: 90 tablet, Rfl: 3  Social History   Tobacco Use  Smoking Status Current Some Day Smoker  . Packs/day: 0.20  . Years: 30.00  . Pack years: 6.00  . Types: Cigarettes  . Last attempt to quit: 11/05/2010  . Years since quitting: 8.5  Smokeless Tobacco Never Used    Allergies  Allergen Reactions  . Feraheme [Ferumoxytol]     Found unresponsive after starting infusion  . Amlodipine Swelling and Other (See Comments)    Bilateral lower extremity swelling   Objective:   Vitals:   06/06/19 1422  Temp: (!) 97.2 F (36.2 C)   There is no height or weight on file to calculate BMI. Constitutional Well developed. Well nourished.  Vascular Foot warm and well perfused. Capillary refill normal to all digits.   Neurologic Normal speech. Oriented to person, place, and time. Epicritic sensation to light touch grossly present bilaterally.  Dermatologic  wound measuring 3 x 1 x 0.5  with fully granular base post debridement of fibrous tissue about the wound.  No surrounding warmth, erythema signs of acute infection.  No ascending cellulitis.  Orthopedic:  Mild pain to palpation noted about the wound   Radiographs: None Assessment:   1. Ulcer of left foot, with necrosis of bone ([place])    Plan:  Patient was evaluated and treated and  all questions answered.  S/p foot surgery left -Wound has not meaningfully improved for several weeks.  Well trial more advanced modality with mechanical wound VAC application.  The mechanical wound VAC system was applied in the office per standard technique.  See details below.  Leg ulcer fitted and applied.  Patient educated on use. -Excisional debridement as below -Follow-up next Monday for wound VAC change.  We will replace the entire disposable component  Procedure: Excisional Debridement of Wound Rationale: Removal of non-viable soft tissue from the wound to promote healing.  Anesthesia: none Pre-Debridement Wound Measurements: 3 cm x 1 cm x 0.3 cm  Post-Debridement Wound Measurements: 3 cm x 1 cm x 0.5 cm  Type of Debridement: Sharp Excisional Tissue Removed: Non-viable soft tissue Depth of Debridement: subcutaneous tissue. Technique: Sharp excisional debridement to bleeding, viable wound base.  Dressing: Dry, sterile, compression dressing. Disposition: Patient tolerated procedure well. Patient to return in 1 week for follow-up.  Procedure: Mechanical Wound VAC Application Location: left lateral foot Wound Measurement: 3 cm x 1 cm x 0.5 cm  Technique: Blue foam to  wound base, followed by hydrocolloid 0-Ring, followed by hydrocolloid dressing. Plunger maximally depressed with with good seal noted. Disposition: Patient tolerated procedure well.  No follow-ups on file.

## 2019-06-09 ENCOUNTER — Other Ambulatory Visit: Payer: Self-pay

## 2019-06-09 ENCOUNTER — Ambulatory Visit (INDEPENDENT_AMBULATORY_CARE_PROVIDER_SITE_OTHER): Payer: Medicare Other | Admitting: Podiatry

## 2019-06-09 DIAGNOSIS — L97524 Non-pressure chronic ulcer of other part of left foot with necrosis of bone: Secondary | ICD-10-CM

## 2019-06-13 ENCOUNTER — Other Ambulatory Visit: Payer: Self-pay

## 2019-06-13 ENCOUNTER — Ambulatory Visit (INDEPENDENT_AMBULATORY_CARE_PROVIDER_SITE_OTHER): Payer: Medicare Other | Admitting: Podiatry

## 2019-06-13 ENCOUNTER — Ambulatory Visit: Payer: Medicare Other | Admitting: Podiatry

## 2019-06-13 DIAGNOSIS — L97524 Non-pressure chronic ulcer of other part of left foot with necrosis of bone: Secondary | ICD-10-CM | POA: Diagnosis not present

## 2019-06-17 NOTE — Progress Notes (Signed)
Subjective: 73 year old male presents the office today for wound VAC change.  He states he has been doing well he has no new concerns.  He has been under the close follow-up with Dr. March Rummage. Denies any systemic complaints such as fevers, chills, nausea, vomiting. No acute changes since last appointment, and no other complaints at this time.   Objective: NAD Today the wound measures 3 x 1 cm.  Has a depth approximate 0.4 cm with 100% granular base.  There is no surrounding erythema, ascending cellulitis.  No fluctuation potation.  No malodor. No pain with calf compression, swelling, warmth, erythema  Assessment: Chronic ulceration left foot  Plan: -All treatment options discussed with the patient including all alternatives, risks, complications.  -Clean the wound however the wound was not debrided as it was 100% granular and appeared healthy.  The wound was cleaned with wound cleanser.  Snap VAC was then reapplied. -Follow-up with Dr. March Rummage later this week for wound VAC change or sooner if any issues are to arise.  He had no further questions today. -Patient encouraged to call the office with any questions, concerns, change in symptoms.

## 2019-06-18 ENCOUNTER — Ambulatory Visit: Payer: Medicare Other | Admitting: Podiatry

## 2019-06-18 NOTE — Progress Notes (Signed)
Subjective:  Patient ID: [name], male    DOB: [birth year],  MRN: [id number]  Chief Complaint  Patient presents with  . Wound Check    Wound vac in place. L foot, dorsal, lateral side. Pt stated, "It feels okay. Hurts sometimes. I forget to pump it sometimes".    DOS: 03/26/2019 Procedure: Bone biopsies x2, wound debridement, application of skin graft substitute  73 y.o. male returns for post-op check. Hx confirmed with patient. No issues with the VAC.  States he has forgotten to push the plunger down on the [facility] quite often.  Review of Systems: Negative except as noted in the HPI. Denies N/V/F/Ch.  Past Medical History:  Diagnosis Date  . 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  Excellent symptomatic response to LES Botox injections.   . Anemia 08/20/2018  . AVM (arteriovenous malformation) of colon 08/26/2013   Cecum X 3, without bleeding on colonoscopy February 2015   . B12 deficiency 10/01/2011   Discovered to 2 progressive neurologic pain and dysfunction.  Diagnosis established March 2013.  Requires the following treatment: B12 IM monthly until level normalizes. After that, oral supplementation.   . Cataract   . Chronic venous insufficiency 11/28/2013  . Constipation due to opioid therapy 03/11/2015  . Deep venous thrombosis ([place]) 05/31/2018   Right lower extremity, unprovoked  . Diverticulosis 08/26/2013  . Essential hypertension 06/15/2006  . Family history of malignant neoplasm of gastrointestinal tract 06/30/2013   Father with colon cancer age 39   . Gastroesophageal reflux disease 02/04/2007  . GI bleeding 08/21/2018  . Internal hemorrhoids 08/26/2013  . Left posterior subcapsular cataract 10/03/2013   s/p yag laser capsulotomy 10/03/2013   . 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 Lumbar stenosis with neurogenic  claudication 06/16/2006   Moderate: L2 through L4.  Complicated by chronic low back pain and neuropathy.  Nerve conduction study (10/19/11): Diffusely low motor amplitudes, with primarily involvement of the peroneal and posterior tibial nerves. No evidence of generalized peripheral neuropathy. Findings could be c/w bil multilevel lumbosacral radiculopathies or a primary motor neuropathy.   . Mild aortic valve stenosis 10/09/2006   Echo (12/09/2009): Valve area: 2.03 cm2   . Neuromuscular disorder ([place])   . Neuropathy   . Obesity, Class I, BMI 30-34.9 04/05/2012  . Onychomycosis of toenail 06/05/2014  . Osteoarthritis 11/25/2009   Right knee (medial compartment), right hip, left shoulder   . Peripheral neuropathy   . 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 Trigger little finger of right hand 03/11/2015    Current Outpatient Medications:  .  COMBIGAN 0.2-0.5 % ophthalmic solution, Place 1 drop into both eyes 2 (two) times daily., Disp: , Rfl:  .  doxycycline (VIBRA-TABS) 100 MG tablet, Take 1 tablet (100 mg total) by mouth 2 (two) times daily., Disp: 14 tablet, Rfl: 0 .  furosemide (LASIX) 40 MG tablet, TAKE 1 TABLET BY MOUTH DAILY., Disp: 30 tablet, Rfl: 11 .  gabapentin (NEURONTIN) 300 MG capsule, Take 2 capsules (600 mg total) by mouth 3 (three) times daily. (Patient taking differently: Take 600 mg by mouth 2 (two) times daily. ), Disp: 540 capsule, Rfl: 3 .  LUMIGAN 0.01 % SOLN, , Disp: , Rfl:  .  oxyCODONE (OXY IR/ROXICODONE) 5 MG immediate release tablet, TAKE 1 TABLET BY MOUTH EVERY 8  HOURS AS NEEDED FOR SEVERE PAIN, Disp: 12 tablet, Rfl: 0 .  oxyCODONE-acetaminophen (PERCOCET) 10-325 MG tablet, Take 1 tablet by mouth every 8 (eight) hours as needed for pain., Disp: 75 tablet, Rfl: 0 .  [START ON 06/28/2019] oxyCODONE-acetaminophen (PERCOCET) 10-325 MG tablet, Take 1 tablet by mouth every 8 (eight) hours as needed for pain., Disp: 75 tablet,  Rfl: 0 .  pantoprazole (PROTONIX) 40 MG tablet, Take 1 tablet (40 mg total) by mouth 2 (two) times daily., Disp: 180 tablet, Rfl: 1 .  sorbitol 70 % solution, Take 15 mLs by mouth daily as needed. (Patient taking differently: Take 15 mLs by mouth daily as needed (for constipation). ), Disp: 3840 mL, Rfl: 3 .  verapamil (CALAN-SR) 180 MG CR tablet, Take 1 tablet (180 mg total) by mouth daily., Disp: 90 tablet, Rfl: 3 .  vitamin B-12 (CYANOCOBALAMIN) 1000 MCG tablet, Take 1 tablet (1,000 mcg total) by mouth daily., Disp: 90 tablet, Rfl: 3  Social History   Tobacco Use  Smoking Status Current Some Day Smoker  . Packs/day: 0.20  . Years: 30.00  . Pack years: 6.00  . Types: Cigarettes  . Last attempt to quit: 11/05/2010  . Years since quitting: 8.6  Smokeless Tobacco Never Used    Allergies  Allergen Reactions  . Feraheme [Ferumoxytol]     Found unresponsive after starting infusion  . Amlodipine Swelling and Other (See Comments)    Bilateral lower extremity swelling   Objective:   There were no vitals filed for this visit. There is no height or weight on file to calculate BMI. Constitutional Well developed. Well nourished.  Vascular Foot warm and well perfused. Capillary refill normal to all digits.   Neurologic Normal speech. Oriented to person, place, and time. Epicritic sensation to light touch grossly present bilaterally.  Dermatologic Wound measuring 2.8x0.7x0.3 mostly granular wound left dorsal lateral foot. No surrounding warmth, erythema signs of acute infection.  No ascending cellulitis.  Orthopedic:  Mild pain to palpation noted about the wound   Radiographs: None Assessment:   1. Ulcer of left foot, with necrosis of bone ([place])    Plan:  Patient was evaluated and treated and all questions answered.  S/p foot surgery left -Wound improving on mWVAC therapy -Wound debrided, VAC reapplied  Procedure: Selective Debridement of Wound Rationale: Removal of devitalized  tissue from the wound to promote healing.  Pre-Debridement Wound Measurements: 2.8 cm x 0.7 cm x 0.3 cm  Post-Debridement Wound Measurements: same as pre-debridement. Type of Debridement: sharp selective Tissue Removed: Devitalized soft-tissue Dressing: Dry, sterile, compression dressing. Disposition: Patient tolerated procedure well. Patient to return in 1 week for follow-up.  Procedure: Mechanical Wound VAC Application Location: left dorsal foot Wound Measurement: 2.8 cm x 0.7 cm x 0.3 cm  Technique: Blue foam to wound base, followed by hydrocolloid 0-Ring, followed by hydrocolloid dressing. Plunger maximally depressed with with good seal noted. Disposition: Patient tolerated procedure well.  No follow-ups on file.

## 2019-07-07 NOTE — Progress Notes (Signed)
Subjective:  Patient ID: [name], male    DOB: August 30, 1945,  MRN: [id number]  No chief complaint on file.   DOS: 03/26/2019 Procedure: Bone biopsies x2, wound debridement, application of skin graft substitute  74 y.o. male returns for post-op check.  Pain controlled denies new issues with the wound  Review of Systems: Negative except as noted in the HPI. Denies N/V/F/Ch.  Past Medical History:  Diagnosis Date  . 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  Excellent symptomatic response to LES Botox injections.   . Anemia 08/20/2018  . AVM (arteriovenous malformation) of colon 08/26/2013   Cecum X 3, without bleeding on colonoscopy February 2015   . B12 deficiency 10/01/2011   Discovered to 2 progressive neurologic pain and dysfunction.  Diagnosis established March 2013.  Requires the following treatment: B12 IM monthly until level normalizes. After that, oral supplementation.   . Cataract   . Chronic venous insufficiency 11/28/2013  . Constipation due to opioid therapy 03/11/2015  . Deep venous thrombosis ([place]) 05/31/2018   Right lower extremity, unprovoked  . Diverticulosis 08/26/2013  . Essential hypertension 06/15/2006  . Family history of malignant neoplasm of gastrointestinal tract 06/30/2013   Father with colon cancer age 79   . Gastroesophageal reflux disease 02/04/2007  . GI bleeding 08/21/2018  . Internal hemorrhoids 08/26/2013  . Left posterior subcapsular cataract 10/03/2013   s/p yag laser capsulotomy 10/03/2013   . 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  Complicated by chronic low back pain and neuropathy.  Nerve conduction study (10/19/11): Diffusely low motor amplitudes, with primarily involvement of the peroneal and posterior tibial  nerves. No evidence of generalized peripheral neuropathy. Findings could be c/w bil multilevel lumbosacral radiculopathies or a primary motor neuropathy.   . Mild aortic valve stenosis 10/09/2006   Echo (12/09/2009): Valve area: 2.03 cm2   . Neuromuscular disorder ([name])   . Neuropathy   . Obesity, Class I, BMI 30-34.9 04/05/2012  . Onychomycosis of toenail 06/05/2014  . Osteoarthritis 11/25/2009   Right knee (medial compartment), right hip, left shoulder   . Peripheral neuropathy   . 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 Trigger little finger of right hand 03/11/2015    Current Outpatient Medications:  .  COMBIGAN 0.2-0.5 % ophthalmic solution, Place 1 drop into both eyes 2 (two) times daily., Disp: , Rfl:  .  doxycycline (VIBRA-TABS) 100 MG tablet, Take 1 tablet (100 mg total) by mouth 2 (two) times daily., Disp: 14 tablet, Rfl: 0 .  furosemide (LASIX) 40 MG tablet, TAKE 1 TABLET BY MOUTH DAILY., Disp: 30 tablet, Rfl: 11 .  gabapentin (NEURONTIN) 300 MG capsule, Take 2 capsules (600 mg total) by mouth 3 (three) times daily. (Patient taking differently: Take 600 mg by mouth 2 (two) times daily. ), Disp: 540 capsule, Rfl: 3 .  LUMIGAN 0.01 % SOLN, , Disp: , Rfl:  .  oxyCODONE (OXY IR/ROXICODONE) 5 MG immediate release tablet, TAKE 1 TABLET BY MOUTH EVERY 8 HOURS AS NEEDED FOR SEVERE PAIN, Disp: 12 tablet, Rfl: 0 .  oxyCODONE-acetaminophen (PERCOCET) 10-325 MG tablet, Take 1 tablet by mouth every 8 (eight) hours as needed for pain., Disp: 75 tablet, Rfl: 0 .  oxyCODONE-acetaminophen (PERCOCET) 10-325 MG tablet, Take 1 tablet  by mouth every 8 (eight) hours as needed for pain., Disp: 75 tablet, Rfl: 0 .  pantoprazole (PROTONIX) 40 MG tablet, Take 1 tablet (40 mg total) by mouth 2 (two) times daily., Disp: 180 tablet, Rfl: 1 .  sorbitol 70 % solution, Take 15 mLs by mouth daily as needed. (Patient taking differently: Take 15 mLs by mouth daily as  needed (for constipation). ), Disp: 3840 mL, Rfl: 3 .  verapamil (CALAN-SR) 180 MG CR tablet, Take 1 tablet (180 mg total) by mouth daily., Disp: 90 tablet, Rfl: 3 .  vitamin B-12 (CYANOCOBALAMIN) 1000 MCG tablet, Take 1 tablet (1,000 mcg total) by mouth daily., Disp: 90 tablet, Rfl: 3  Social History   Tobacco Use  Smoking Status Current Some Day Smoker  . Packs/day: 0.20  . Years: 30.00  . Pack years: 6.00  . Types: Cigarettes  . Last attempt to quit: 11/05/2010  . Years since quitting: 8.6  Smokeless Tobacco Never Used    Allergies  Allergen Reactions  . Feraheme [Ferumoxytol]     Found unresponsive after starting infusion  . Amlodipine Swelling and Other (See Comments)    Bilateral lower extremity swelling   Objective:   There were no vitals filed for this visit. There is no height or weight on file to calculate BMI. Constitutional Well developed. Well nourished.  Vascular Foot warm and well perfused. Capillary refill normal to all digits.   Neurologic Normal speech. Oriented to person, place, and time. Epicritic sensation to light touch grossly present bilaterally.  Dermatologic  wound measuring 2.5x1.5x0.5 to the left foot with 80% granular 20% fibrous base without warmth erythema signs of acute infection  Orthopedic: Tenderness to palpation noted about the surgical site.   Radiographs: None Assessment:   1. Ulcer of left foot, with necrosis of bone ([place])    Plan:  Patient was evaluated and treated and all questions answered.  S/p foot surgery left -Debrided as below, dressed with Prisma and DSD.  Procedure: Selective Debridement of Wound Rationale: Removal of devitalized tissue from the wound to promote healing.  Pre-Debridement Wound Measurements: 2.5 cm x 1.5 cm x 0.5 cm  Post-Debridement Wound Measurements: same as pre-debridement. Type of Debridement: sharp selective Tissue Removed: Devitalized soft-tissue Dressing: Dry, sterile, compression  dressing. Disposition: Patient tolerated procedure well. Patient to return in 1 week for follow-up.      No follow-ups on file.

## 2019-07-08 DIAGNOSIS — M1711 Unilateral primary osteoarthritis, right knee: Secondary | ICD-10-CM | POA: Diagnosis not present

## 2019-07-09 ENCOUNTER — Encounter: Payer: Self-pay | Admitting: Podiatry

## 2019-07-09 ENCOUNTER — Other Ambulatory Visit: Payer: Self-pay

## 2019-07-09 ENCOUNTER — Ambulatory Visit (INDEPENDENT_AMBULATORY_CARE_PROVIDER_SITE_OTHER): Payer: Medicare Other | Admitting: Podiatry

## 2019-07-09 DIAGNOSIS — L97524 Non-pressure chronic ulcer of other part of left foot with necrosis of bone: Secondary | ICD-10-CM | POA: Diagnosis not present

## 2019-07-09 DIAGNOSIS — D689 Coagulation defect, unspecified: Secondary | ICD-10-CM

## 2019-07-09 DIAGNOSIS — E08622 Diabetes mellitus due to underlying condition with other skin ulcer: Secondary | ICD-10-CM

## 2019-07-09 DIAGNOSIS — L97322 Non-pressure chronic ulcer of left ankle with fat layer exposed: Secondary | ICD-10-CM

## 2019-07-09 NOTE — Progress Notes (Signed)
Subjective:  Patient ID: [name], male    DOB: 04/18/1946,  MRN: [id number]  Chief Complaint  Patient presents with  . Wound Check    L foot. Plunger was missing from his wound vac. Pt stated, "I lost it". He has been unable to pump the wound vac. There is a foul odor and drainage. The area is not hot to the touch. Pt stated, "Was I supposed to pump it every hour? I was told not to wash it, so it needs to be washed".     DOS: 03/26/2019 Procedure: Bone biopsies x2, wound debridement, application of skin graft substitute  74 y.o. male returns for post-op check. Hx confirmed with patient. No issues with the VAC.  States that the plunger fell out.  He states that there is a malodor present from the wound.  Review of Systems: Negative except as noted in the HPI. Denies N/V/F/Ch.  Past Medical History:  Diagnosis Date  . 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  Excellent symptomatic response to LES Botox injections.   . Anemia 08/20/2018  . AVM (arteriovenous malformation) of colon 08/26/2013   Cecum X 3, without bleeding on colonoscopy February 2015   . B12 deficiency 10/01/2011   Discovered to 2 progressive neurologic pain and dysfunction.  Diagnosis established March 2013.  Requires the following treatment: B12 IM monthly until level normalizes. After that, oral supplementation.   . Cataract   . Chronic venous insufficiency 11/28/2013  . Constipation due to opioid therapy 03/11/2015  . Deep venous thrombosis ([place]) 05/31/2018   Right lower extremity, unprovoked  . Diverticulosis 08/26/2013  . Essential hypertension 06/15/2006  . Family history of malignant neoplasm of gastrointestinal tract 06/30/2013   Father with colon cancer age 37   . Gastroesophageal reflux disease 02/04/2007  . GI bleeding 08/21/2018  . Internal hemorrhoids 08/26/2013  . Left posterior subcapsular cataract 10/03/2013   s/p yag  laser capsulotomy 10/03/2013   . 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  Complicated by chronic low back pain and neuropathy.  Nerve conduction study (10/19/11): Diffusely low motor amplitudes, with primarily involvement of the peroneal and posterior tibial nerves. No evidence of generalized peripheral neuropathy. Findings could be c/w bil multilevel lumbosacral radiculopathies or a primary motor neuropathy.   . Mild aortic valve stenosis 10/09/2006   Echo (12/09/2009): Valve area: 2.03 cm2   . Neuromuscular disorder ([place])   . Neuropathy   . Obesity, Class I, BMI 30-34.9 04/05/2012  . Onychomycosis of toenail 06/05/2014  . Osteoarthritis 11/25/2009   Right knee (medial compartment), right hip, left shoulder   . Peripheral neuropathy   . 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 Trigger little finger of right hand 03/11/2015    Current Outpatient Medications:  .  COMBIGAN 0.2-0.5 % ophthalmic solution, Place 1 drop into both eyes 2 (two) times daily., Disp: , Rfl:  .  furosemide (LASIX) 40 MG tablet, TAKE 1 TABLET BY MOUTH DAILY., Disp: 30 tablet, Rfl: 11 .  gabapentin (NEURONTIN) 300 MG capsule, Take 2 capsules (600 mg total) by mouth 3 (three) times daily. (Patient taking differently: Take 600 mg by mouth 2 (two) times daily. ), Disp: 540 capsule, Rfl: 3 .  LUMIGAN 0.01 % SOLN, , Disp: ,  Rfl:  .  oxyCODONE (OXY IR/ROXICODONE) 5 MG immediate release tablet, TAKE 1 TABLET BY MOUTH EVERY 8 HOURS AS NEEDED FOR SEVERE PAIN, Disp: 12 tablet, Rfl: 0 .  oxyCODONE-acetaminophen (PERCOCET) 10-325 MG tablet, Take 1 tablet by mouth every 8 (eight) hours as needed for pain., Disp: 75 tablet, Rfl: 0 .  oxyCODONE-acetaminophen (PERCOCET) 10-325 MG tablet, Take 1 tablet by mouth every 8 (eight) hours as needed  for pain., Disp: 75 tablet, Rfl: 0 .  pantoprazole (PROTONIX) 40 MG tablet, Take 1 tablet (40 mg total) by mouth 2 (two) times daily., Disp: 180 tablet, Rfl: 1 .  sorbitol 70 % solution, Take 15 mLs by mouth daily as needed. (Patient taking differently: Take 15 mLs by mouth daily as needed (for constipation). ), Disp: 3840 mL, Rfl: 3 .  verapamil (CALAN-SR) 180 MG CR tablet, Take 1 tablet (180 mg total) by mouth daily., Disp: 90 tablet, Rfl: 3 .  vitamin B-12 (CYANOCOBALAMIN) 1000 MCG tablet, Take 1 tablet (1,000 mcg total) by mouth daily., Disp: 90 tablet, Rfl: 3  Social History   Tobacco Use  Smoking Status Current Some Day Smoker  . Packs/day: 0.20  . Years: 30.00  . Pack years: 6.00  . Types: Cigarettes  . Last attempt to quit: 11/05/2010  . Years since quitting: 8.6  Smokeless Tobacco Never Used    Allergies  Allergen Reactions  . Feraheme [Ferumoxytol]     Found unresponsive after starting infusion  . Amlodipine Swelling and Other (See Comments)    Bilateral lower extremity swelling   Objective:   There were no vitals filed for this visit. There is no height or weight on file to calculate BMI. Constitutional Well developed. Well nourished.  Vascular Foot warm and well perfused. Capillary refill normal to all digits.   Neurologic Normal speech. Oriented to person, place, and time. Epicritic sensation to light touch grossly present bilaterally.  Dermatologic Wound measuring 2 x1 x0.3 mostly granular wound left dorsal lateral foot. No surrounding warmth, erythema signs of acute infection.  No ascending cellulitis.  Orthopedic:  Mild pain to palpation noted about the wound   Radiographs: None Assessment:   No diagnosis found. Plan:  Patient was evaluated and treated and all questions answered.  S/p foot surgery left -Wound improving on mWVAC therapy however maceration  -Wound debrided however I will hold off on VAC therapy for this week.  Given that there is  maceration present around the wound I applied Betadine wet-to-dry dressing changes for this week.  However next week is scheduled to see Dr. March Rummage and will lace him back on mechanical wound VAC therapy.  Procedure: Selective Debridement of Wound Rationale: Removal of devitalized tissue from the wound to promote healing.  Pre-Debridement Wound Measurements: 2 cm x 1 cm x 0.3 cm Post-Debridement Wound Measurements: same as pre-debridement. Type of Debridement: sharp selective Tissue Removed: Devitalized soft-tissue Dressing: Dry, sterile, compression dressing. Disposition: Patient tolerated procedure well. Patient to return in 1 week for follow-up.   Return in about 1 week (around 07/16/2019) for Foot Ulcer.

## 2019-07-18 ENCOUNTER — Other Ambulatory Visit: Payer: Self-pay

## 2019-07-18 ENCOUNTER — Ambulatory Visit (INDEPENDENT_AMBULATORY_CARE_PROVIDER_SITE_OTHER): Payer: Medicare Other | Admitting: Podiatry

## 2019-07-18 DIAGNOSIS — L97322 Non-pressure chronic ulcer of left ankle with fat layer exposed: Secondary | ICD-10-CM

## 2019-07-18 DIAGNOSIS — E08622 Diabetes mellitus due to underlying condition with other skin ulcer: Secondary | ICD-10-CM | POA: Diagnosis not present

## 2019-07-18 NOTE — Patient Instructions (Signed)
Apply small amount of Prisma material to wound bed. Moisten with saline. Cover with band-aid. Change daily. It is ok to shower and get your foot wet.

## 2019-07-18 NOTE — Progress Notes (Signed)
  Subjective:  Patient ID: [name], male    DOB: [birth year],  MRN: [id number]  Chief Complaint  Patient presents with  . Wound Check    L ankle. Pt stated, "It still hurts. Want to know when I can take the surgical shoe off".    74 y.o. male presents for wound care. Hx confirmed with patient. Thinks the wound is doing ok. Denies new isuses. Objective:  Physical Exam: Wound Location: left lateral foot Wound Measurement: 2x1 post-debridement Wound Base: Mixed Granular/Fibrotic Peri-wound: Normal Exudate: Scant/small amount Serosanguinous exudate wound without warmth, erythema, signs of acute infection and appears improved compared to last recheck    Assessment:   1. 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  Ulcer Left Lateral Foot -Dressing applied consisting of prisma, sterile gauze, kerlix and ACE bandage -Offload ulcer with surgical shoe -Wound cleansed and debrided  Procedure: Excisional Debridement of Wound Rationale: Removal of non-viable soft tissue from the wound to promote healing.  Anesthesia: none Pre-Debridement Wound Measurements: 1.5 cm x 0.5 cm x 0.3 cm  Post-Debridement Wound Measurements: 2 cm x 1 cm x 0.3 cm  Type of Debridement: Sharp Excisional Tissue Removed: Non-viable soft tissue Depth of Debridement: subcutaneous tissue. Technique: Sharp excisional debridement to bleeding, viable wound base.  Dressing: Dry, sterile, compression dressing. Disposition: Patient tolerated procedure well. Patient to return in 1 week for follow-up.  Return in about 2 weeks (around 08/01/2019) for Wound Care, Left.

## 2019-07-21 ENCOUNTER — Other Ambulatory Visit: Payer: Self-pay | Admitting: *Deleted

## 2019-07-23 MED ORDER — PANTOPRAZOLE SODIUM 40 MG PO TBEC: 40.0000 mg | DELAYED_RELEASE_TABLET | Freq: Two times a day (BID) | ORAL | 0 refills | Status: DC

## 2019-07-28 ENCOUNTER — Telehealth: Payer: Self-pay | Admitting: *Deleted

## 2019-07-28 ENCOUNTER — Encounter: Payer: Self-pay | Admitting: *Deleted

## 2019-07-28 NOTE — Telephone Encounter (Signed)
Received following message from patient's niece, [name]:  Can you assist with getting his PCS hours corrected. The last assessment was done via phone and he did not understand what was being asked. It resulted in his PCS hours being reduced from 75 to 2. To correct it he needs a Medical Change status form ([phone]) faxed to [facility] at [phone].  His level of independence and mobility has decreased since last year.  Please let me know if you need additional information. [name] Akamine [phone]) Thanks in advance.  PCS form given to PCP for completion/signature. [name], BSN, RN-BC

## 2019-07-29 NOTE — Telephone Encounter (Signed)
Completed/signed PCS form faxed on 07/28/2019 to [facility] at [phone] per request below. [name], BSN, RN-BC

## 2019-08-01 ENCOUNTER — Other Ambulatory Visit: Payer: Self-pay

## 2019-08-01 ENCOUNTER — Ambulatory Visit (INDEPENDENT_AMBULATORY_CARE_PROVIDER_SITE_OTHER): Payer: Medicare Other | Admitting: Podiatry

## 2019-08-01 DIAGNOSIS — E08622 Diabetes mellitus due to underlying condition with other skin ulcer: Secondary | ICD-10-CM

## 2019-08-01 DIAGNOSIS — L97322 Non-pressure chronic ulcer of left ankle with fat layer exposed: Secondary | ICD-10-CM

## 2019-08-13 ENCOUNTER — Other Ambulatory Visit: Payer: Self-pay

## 2019-08-13 DIAGNOSIS — M1711 Unilateral primary osteoarthritis, right knee: Secondary | ICD-10-CM

## 2019-08-13 MED ORDER — OXYCODONE-ACETAMINOPHEN 10-325 MG PO TABS: 1.0000 | ORAL_TABLET | Freq: Three times a day (TID) | ORAL | 0 refills | Status: DC | PRN

## 2019-08-14 ENCOUNTER — Ambulatory Visit: Payer: Medicare Other | Admitting: Internal Medicine

## 2019-08-15 ENCOUNTER — Other Ambulatory Visit: Payer: Self-pay | Admitting: *Deleted

## 2019-08-15 DIAGNOSIS — M48062 Spinal stenosis, lumbar region with neurogenic claudication: Secondary | ICD-10-CM

## 2019-08-18 MED ORDER — GABAPENTIN 300 MG PO CAPS: 600.0000 mg | ORAL_CAPSULE | Freq: Three times a day (TID) | ORAL | 3 refills | Status: DC

## 2019-08-24 NOTE — Progress Notes (Signed)
  Subjective:  Patient ID: [name], male    DOB: [birth year],  MRN: [id number]  Chief Complaint  Patient presents with  . Wound Check    Pt denies fever/nausea/vomiting/chills. Pt states his surgical shoe is causing him to fall/lose balance.    74 y.o. male presents for wound care. Hx confirmed with patient.  Objective:  Physical Exam: Wound Location: left lateral foot Wound Measurement: 1x0.5 Wound Base: Mixed Granular/Fibrotic Peri-wound: Normal Exudate: Scant/small amount Serosanguinous exudate wound without warmth, erythema, signs of acute infection and appears improved compared to last recheck    Assessment:   1. 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  Ulcer Left Lateral Foot -Dressing applied consisting of prisma, sterile gauze, kerlix and ACE bandage -Trial normal shoegear -Wound improving -Wound cleansed and debrided  Procedure: Selective Debridement of Wound Rationale: Removal of devitalized tissue from the wound to promote healing.  Pre-Debridement Wound Measurements: 1 cm x 0.5 cm x 0.2 cm  Post-Debridement Wound Measurements: same as pre-debridement. Type of Debridement: sharp selective Tissue Removed: Devitalized soft-tissue Dressing: Dry, sterile, compression dressing. Disposition: Patient tolerated procedure well. Patient to return in 1 week for follow-up.    Return in about 1 month (around 08/29/2019).

## 2019-08-29 ENCOUNTER — Ambulatory Visit: Payer: Medicare Other | Admitting: Podiatry

## 2019-09-09 NOTE — Progress Notes (Signed)
  Subjective:  Patient ID: [name], male    DOB: [birth year],  MRN: [id number]  Chief Complaint  Patient presents with  . Routine Post Op    " my foot is hurting quite a bit, I am out of the pain meds"     DOS: 03/26/19 Procedure: Debridement left ankle, bone biopsy, application of skin graft substitute  74 y.o. male presents with the above complaint. History confirmed with patient.   Objective:  Physical Exam: tenderness at the surgical site, local edema noted and calf supple, nontender. Incision: healing well, no significant drainage, no dehiscence, no significant erythema, graft intact    Bone Biopsies 9/30 A. BONE, FOOT, LEFT METATARSAL, BIOPSY:  - Scant benign cartilage.  - No acute inflammation.   B. BONE, FOOT, LEFT CUBOID, BIOPSY:  - Scant benign bone.  - No acute inflammation.   Wound Cx: [place], [facility] ST only; Bone neg  Assessment:   1. Post-operative state     Plan:  Patient was evaluated and treated and all questions answered.  Post-operative State -Dressing applied consisting of sterile gauze, kerlix and ACE bandage -WBAT in Surgical shoe -Continue abx and pain medicaiton  No follow-ups on file.

## 2019-09-12 ENCOUNTER — Ambulatory Visit (INDEPENDENT_AMBULATORY_CARE_PROVIDER_SITE_OTHER): Payer: Medicare Other | Admitting: Internal Medicine

## 2019-09-12 ENCOUNTER — Other Ambulatory Visit: Payer: Self-pay

## 2019-09-12 VITALS — BP 119/70 | HR 72 | Temp 98.0°F | Ht 74.0 in | Wt 258.3 lb

## 2019-09-12 DIAGNOSIS — G8929 Other chronic pain: Secondary | ICD-10-CM

## 2019-09-12 DIAGNOSIS — M1711 Unilateral primary osteoarthritis, right knee: Secondary | ICD-10-CM

## 2019-09-12 DIAGNOSIS — Z79891 Long term (current) use of opiate analgesic: Secondary | ICD-10-CM

## 2019-09-12 DIAGNOSIS — M25561 Pain in right knee: Secondary | ICD-10-CM | POA: Insufficient documentation

## 2019-09-12 MED ORDER — OXYCODONE-ACETAMINOPHEN 10-325 MG PO TABS: 1.0000 | ORAL_TABLET | Freq: Three times a day (TID) | ORAL | 0 refills | Status: DC | PRN

## 2019-09-12 NOTE — Progress Notes (Signed)
Internal Medicine Clinic Attending  Case discussed with Dr. MacLean at the time of the visit.  We reviewed the resident's history and exam and pertinent patient test results.  I agree with the assessment, diagnosis, and plan of care documented in the resident's note.  Jessi Pitstick, M.D., Ph.D.  

## 2019-09-12 NOTE — Patient Instructions (Addendum)
You were seen for evaluation of your right knee pain.  I do not think you have an infection causing this pain.  I think it is due to osteoarthritis which is a "wear-and-tear" condition.  Here are my recommendations:  1) Please continue taking your oxycodone as previously prescribed.  Please follow-up with Dr. Heber Garwin to consider if your dosage needs to be changed.  2) A referral is been placed to physical therapy, they can help with strengthening your leg which can help with the pain.  3) Even a small amount of weight loss can help reduce the pain you are having in your leg, and help prevent further damage  Thank you for allowing [place] to be part of your medical care!

## 2019-09-12 NOTE — Assessment & Plan Note (Addendum)
Patient reports an achy right knee pain over the last 3 months, reports associated weakness after walking for prolonged periods of time.  Patient denies fever, chills, other symptoms.   On physical exam, patient has no tenderness to palpation, no erythema, warmth, or effusion.  No calf tenderness.  Patient with bilateral lower extremity edema, slightly worse on left side.  Strength is full in right leg.  Select prior encounters reviewed, patient previously counseled against taking NSAIDs given history of GI bleed.  Patient without evidence of hepatic or renal dysfunction on last laboratory studies.  A/P: History and physical examination most consistent with osteoarthritis.  Given prolonged chronic nature and physical exam, low likelihood for gouty arthritis, septic arthritis, or DVT.  Patient reports he is on oxycodone chronically for both his knee pain and back pain. *Patient counseled on weight loss *Referral for physical therapy *Refill placed for 25 days of oxycodone-acetaminophen 10-325 mg, 1 tablet every 8 hours.  PDMP reviewed *Patient advised to schedule follow-up for next available with Dr. Heber Bostwick for discussion if patient needs increased pain management regimen

## 2019-09-12 NOTE — Progress Notes (Signed)
CC: Right knee pain  HPI: Patient is a 74 year old male with past medical history as below who presents for evaluation of right knee pain.  [name] is a 74 y.o.   Past Medical History:  Diagnosis Date  . 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  Excellent symptomatic response to LES Botox injections.   . Anemia 08/20/2018  . AVM (arteriovenous malformation) of colon 08/26/2013   Cecum X 3, without bleeding on colonoscopy February 2015   . B12 deficiency 10/01/2011   Discovered to 2 progressive neurologic pain and dysfunction.  Diagnosis established March 2013.  Requires the following treatment: B12 IM monthly until level normalizes. After that, oral supplementation.   . Cataract   . Chronic venous insufficiency 11/28/2013  . Constipation due to opioid therapy 03/11/2015  . Deep venous thrombosis ([place]) 05/31/2018   Right lower extremity, unprovoked  . Diverticulosis 08/26/2013  . Essential hypertension 06/15/2006  . Family history of malignant neoplasm of gastrointestinal tract 06/30/2013   Father with colon cancer age 22   . Gastroesophageal reflux disease 02/04/2007  . GI bleeding 08/21/2018  . Internal hemorrhoids 08/26/2013  . Left posterior subcapsular cataract 10/03/2013   s/p yag laser capsulotomy 10/03/2013   . 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  Complicated by chronic low back pain and neuropathy.  Nerve conduction study (10/19/11): Diffusely low motor amplitudes, with primarily involvement of the peroneal and posterior tibial nerves. No evidence of generalized peripheral neuropathy. Findings could be c/w bil multilevel lumbosacral radiculopathies or a primary motor neuropathy.   . Mild aortic valve stenosis 10/09/2006   Echo  (12/09/2009): Valve area: 2.03 cm2   . Neuromuscular disorder ([place])   . Neuropathy   . Obesity, Class I, BMI 30-34.9 04/05/2012  . Onychomycosis of toenail 06/05/2014  . Osteoarthritis 11/25/2009   Right knee (medial compartment), right hip, left shoulder   . Peripheral neuropathy   . 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 Trigger little finger of right hand 03/11/2015   Review of Systems:   Review of Systems  Constitutional: Negative for chills and fever.  Respiratory: Negative for shortness of breath.   Cardiovascular: Negative for chest pain.  Gastrointestinal: Negative for abdominal pain.  All other systems reviewed and are negative.    Physical Exam:  Vitals:   09/12/19 0930  BP: 119/70  Pulse: 72  Temp: 98 F (36.7 C)  TempSrc: Oral  SpO2: 100%  Weight: 258 lb 4.8 oz (117.2 kg)  Height: 6\' 2"  (1.88 m)   Physical Exam  Constitutional: He is well-developed, well-nourished, and in no distress.  HENT:  Head: Normocephalic and atraumatic.  Eyes: EOM are normal. Right eye exhibits no discharge. Left eye exhibits no discharge.  Neck: No tracheal deviation present.  Cardiovascular: Normal rate and regular rhythm. Exam reveals no gallop and no friction rub.  No murmur heard. Pulmonary/Chest: Effort normal and breath sounds normal. No respiratory distress. He has no wheezes. He has no rales.  Abdominal: Soft. He exhibits no distension. There is no abdominal tenderness. There is no rebound and no guarding.  Musculoskeletal:        General: No tenderness, deformity or edema. Normal range of motion.     Cervical back: Normal range  of motion.     Comments: * No tenderness to palpation of right knee. No erythema, warmth, of effusion. * 1+ pitting edema to lower shins bilaterally, slightly worse on left side  Neurological: He is alert. Coordination normal.  Strength 5/5 in right leg  Skin: Skin is warm and dry. No rash noted.  He is not diaphoretic. No erythema.  Psychiatric: Memory and judgment normal.     Assessment & Plan:   See Encounters Tab for problem based charting.  Patient discussed with Dr. Rebeca Alert

## 2019-09-15 ENCOUNTER — Telehealth: Payer: Self-pay | Admitting: Internal Medicine

## 2019-09-15 NOTE — Telephone Encounter (Signed)
Pt's caregiver requesting a call back in reference to the patient getting a Teacher, English as a foreign language.  Please call back.

## 2019-09-16 NOTE — Telephone Encounter (Signed)
Returned call to [employer]. No answer after many rings and no VM set up. Patient was seen by Dr. Benjamine Mola on 09/12/2019. Dr. Benjamine Mola is out of office till 09/22/2019. Will discuss lift mechanism with him at his return. Please also refer to patient message from 09/08/2019. [name], BSN, RN-BC

## 2019-09-23 NOTE — Telephone Encounter (Signed)
I think it will be best to have patient re-evaluated by physical therapy before deciding on filing for lift mechanism. I need to speak with patient briefly before placing the referral for home health PT. I have tried reaching patient twice today and have left messages. I will try to reach him again tomorrow

## 2019-09-24 ENCOUNTER — Other Ambulatory Visit: Payer: Self-pay | Admitting: Internal Medicine

## 2019-09-24 DIAGNOSIS — M1711 Unilateral primary osteoarthritis, right knee: Secondary | ICD-10-CM

## 2019-09-24 NOTE — Telephone Encounter (Signed)
An order has been placed for [facility] PT. Community message sent to Patsy Baltimore at [facility] to see if they can restart this service for patient. Also, requested eval by the PT to see if patient qualifies for the lift mechanism. [name], BSN, RN-BC

## 2019-09-24 NOTE — Progress Notes (Addendum)
Spoke with patient niece and have placed referral for home health PT. Requested they assess patient for necessity of lift mechanism  Jeanmarie Hubert, MD 09/24/2019, 1:22 PM

## 2019-09-26 DIAGNOSIS — I739 Peripheral vascular disease, unspecified: Secondary | ICD-10-CM | POA: Diagnosis not present

## 2019-09-26 DIAGNOSIS — G629 Polyneuropathy, unspecified: Secondary | ICD-10-CM | POA: Diagnosis not present

## 2019-09-26 DIAGNOSIS — M1711 Unilateral primary osteoarthritis, right knee: Secondary | ICD-10-CM | POA: Diagnosis not present

## 2019-09-26 DIAGNOSIS — I700 Atherosclerosis of aorta: Secondary | ICD-10-CM | POA: Diagnosis not present

## 2019-09-26 DIAGNOSIS — M19012 Primary osteoarthritis, left shoulder: Secondary | ICD-10-CM | POA: Diagnosis not present

## 2019-09-26 DIAGNOSIS — M1611 Unilateral primary osteoarthritis, right hip: Secondary | ICD-10-CM | POA: Diagnosis not present

## 2019-09-26 DIAGNOSIS — M48062 Spinal stenosis, lumbar region with neurogenic claudication: Secondary | ICD-10-CM | POA: Diagnosis not present

## 2019-09-26 DIAGNOSIS — E538 Deficiency of other specified B group vitamins: Secondary | ICD-10-CM | POA: Diagnosis not present

## 2019-09-26 DIAGNOSIS — D649 Anemia, unspecified: Secondary | ICD-10-CM | POA: Diagnosis not present

## 2019-09-26 DIAGNOSIS — K5790 Diverticulosis of intestine, part unspecified, without perforation or abscess without bleeding: Secondary | ICD-10-CM | POA: Diagnosis not present

## 2019-09-26 DIAGNOSIS — G8929 Other chronic pain: Secondary | ICD-10-CM | POA: Diagnosis not present

## 2019-09-26 DIAGNOSIS — I119 Hypertensive heart disease without heart failure: Secondary | ICD-10-CM | POA: Diagnosis not present

## 2019-09-26 DIAGNOSIS — K219 Gastro-esophageal reflux disease without esophagitis: Secondary | ICD-10-CM | POA: Diagnosis not present

## 2019-09-29 NOTE — Telephone Encounter (Signed)
Berniece Salines, Orvis Brill, RN; Patsy Baltimore; Janae Sauce; [name], [name], [place]   Good morning Shawn  We are able to accept this pt. We will be in touch with him to schedule his start of care. Thanks.   Angie

## 2019-09-30 ENCOUNTER — Telehealth: Payer: Self-pay | Admitting: *Deleted

## 2019-09-30 ENCOUNTER — Telehealth: Payer: Self-pay | Admitting: Internal Medicine

## 2019-09-30 DIAGNOSIS — M1611 Unilateral primary osteoarthritis, right hip: Secondary | ICD-10-CM | POA: Diagnosis not present

## 2019-09-30 DIAGNOSIS — G629 Polyneuropathy, unspecified: Secondary | ICD-10-CM | POA: Diagnosis not present

## 2019-09-30 DIAGNOSIS — G8929 Other chronic pain: Secondary | ICD-10-CM | POA: Diagnosis not present

## 2019-09-30 DIAGNOSIS — D649 Anemia, unspecified: Secondary | ICD-10-CM | POA: Diagnosis not present

## 2019-09-30 DIAGNOSIS — I739 Peripheral vascular disease, unspecified: Secondary | ICD-10-CM | POA: Diagnosis not present

## 2019-09-30 DIAGNOSIS — M48062 Spinal stenosis, lumbar region with neurogenic claudication: Secondary | ICD-10-CM | POA: Diagnosis not present

## 2019-09-30 DIAGNOSIS — K219 Gastro-esophageal reflux disease without esophagitis: Secondary | ICD-10-CM | POA: Diagnosis not present

## 2019-09-30 DIAGNOSIS — M1711 Unilateral primary osteoarthritis, right knee: Secondary | ICD-10-CM

## 2019-09-30 DIAGNOSIS — I700 Atherosclerosis of aorta: Secondary | ICD-10-CM | POA: Diagnosis not present

## 2019-09-30 DIAGNOSIS — E538 Deficiency of other specified B group vitamins: Secondary | ICD-10-CM | POA: Diagnosis not present

## 2019-09-30 DIAGNOSIS — K5790 Diverticulosis of intestine, part unspecified, without perforation or abscess without bleeding: Secondary | ICD-10-CM | POA: Diagnosis not present

## 2019-09-30 DIAGNOSIS — M19012 Primary osteoarthritis, left shoulder: Secondary | ICD-10-CM | POA: Diagnosis not present

## 2019-09-30 DIAGNOSIS — I119 Hypertensive heart disease without heart failure: Secondary | ICD-10-CM | POA: Diagnosis not present

## 2019-09-30 NOTE — Telephone Encounter (Signed)
Returned call to [employer]. No answer. Left message on VM requesting return call. [name], RN, BSN

## 2019-09-30 NOTE — Telephone Encounter (Signed)
[place], [place], Orvis Brill, RN; [place], [place] C, [place]   Just wanted to let you know that the PT [facility] was done for [name] (DOB Jan 26, 2046) on 09/26/19 by Fonnie Birkenhead.

## 2019-09-30 NOTE — Telephone Encounter (Signed)
Checking on a lift chair, pls contact pt [phone]

## 2019-09-30 NOTE — Telephone Encounter (Signed)
HH PT started Friday during day Pt stated he fell in the kitchen Friday night, states he had no injuries but stayed in floor all night. Someone brought him food the next am and helped him up Lauren also spoke to the PT and requested assistance in eval for DME PT- princess [facility] [phone]

## 2019-09-30 NOTE — Telephone Encounter (Signed)
Thank you :)

## 2019-09-30 NOTE — Telephone Encounter (Signed)
HH PT will evaluate patient for lift mechanism. Requested chart notes to be faxed to [place] following eval. L. Makenley Shimp, BSN, RN-BC

## 2019-10-01 NOTE — Telephone Encounter (Signed)
Stenson, Melissa  Dickie Cloe L, RN   Hey Lauren!  Yes, they can bring the CMN to one of our stores. We had to close the [address] store but we still have a retail store on [address] and we also have one in [place].    The lift mechanism or motor is inside the chair already. We don't sell them separately. The best thing to do is have the pt come look at what we have in stock.   Thanks!  Melissa   

## 2019-10-03 DIAGNOSIS — D649 Anemia, unspecified: Secondary | ICD-10-CM | POA: Diagnosis not present

## 2019-10-03 DIAGNOSIS — K5790 Diverticulosis of intestine, part unspecified, without perforation or abscess without bleeding: Secondary | ICD-10-CM | POA: Diagnosis not present

## 2019-10-03 DIAGNOSIS — K219 Gastro-esophageal reflux disease without esophagitis: Secondary | ICD-10-CM | POA: Diagnosis not present

## 2019-10-03 DIAGNOSIS — I739 Peripheral vascular disease, unspecified: Secondary | ICD-10-CM | POA: Diagnosis not present

## 2019-10-03 DIAGNOSIS — M19012 Primary osteoarthritis, left shoulder: Secondary | ICD-10-CM | POA: Diagnosis not present

## 2019-10-03 DIAGNOSIS — G8929 Other chronic pain: Secondary | ICD-10-CM | POA: Diagnosis not present

## 2019-10-03 DIAGNOSIS — G629 Polyneuropathy, unspecified: Secondary | ICD-10-CM | POA: Diagnosis not present

## 2019-10-03 DIAGNOSIS — M48062 Spinal stenosis, lumbar region with neurogenic claudication: Secondary | ICD-10-CM | POA: Diagnosis not present

## 2019-10-03 DIAGNOSIS — I119 Hypertensive heart disease without heart failure: Secondary | ICD-10-CM | POA: Diagnosis not present

## 2019-10-03 DIAGNOSIS — E538 Deficiency of other specified B group vitamins: Secondary | ICD-10-CM | POA: Diagnosis not present

## 2019-10-03 DIAGNOSIS — I700 Atherosclerosis of aorta: Secondary | ICD-10-CM | POA: Diagnosis not present

## 2019-10-03 DIAGNOSIS — M1711 Unilateral primary osteoarthritis, right knee: Secondary | ICD-10-CM | POA: Diagnosis not present

## 2019-10-03 DIAGNOSIS — M1611 Unilateral primary osteoarthritis, right hip: Secondary | ICD-10-CM | POA: Diagnosis not present

## 2019-10-06 DIAGNOSIS — D649 Anemia, unspecified: Secondary | ICD-10-CM | POA: Diagnosis not present

## 2019-10-06 DIAGNOSIS — M1611 Unilateral primary osteoarthritis, right hip: Secondary | ICD-10-CM | POA: Diagnosis not present

## 2019-10-06 DIAGNOSIS — M19012 Primary osteoarthritis, left shoulder: Secondary | ICD-10-CM | POA: Diagnosis not present

## 2019-10-06 DIAGNOSIS — K219 Gastro-esophageal reflux disease without esophagitis: Secondary | ICD-10-CM | POA: Diagnosis not present

## 2019-10-06 DIAGNOSIS — M1711 Unilateral primary osteoarthritis, right knee: Secondary | ICD-10-CM | POA: Diagnosis not present

## 2019-10-06 DIAGNOSIS — E538 Deficiency of other specified B group vitamins: Secondary | ICD-10-CM | POA: Diagnosis not present

## 2019-10-06 DIAGNOSIS — G8929 Other chronic pain: Secondary | ICD-10-CM | POA: Diagnosis not present

## 2019-10-06 DIAGNOSIS — M48062 Spinal stenosis, lumbar region with neurogenic claudication: Secondary | ICD-10-CM | POA: Diagnosis not present

## 2019-10-06 DIAGNOSIS — K5790 Diverticulosis of intestine, part unspecified, without perforation or abscess without bleeding: Secondary | ICD-10-CM | POA: Diagnosis not present

## 2019-10-06 DIAGNOSIS — G629 Polyneuropathy, unspecified: Secondary | ICD-10-CM | POA: Diagnosis not present

## 2019-10-06 DIAGNOSIS — I119 Hypertensive heart disease without heart failure: Secondary | ICD-10-CM | POA: Diagnosis not present

## 2019-10-06 DIAGNOSIS — I700 Atherosclerosis of aorta: Secondary | ICD-10-CM | POA: Diagnosis not present

## 2019-10-06 DIAGNOSIS — I739 Peripheral vascular disease, unspecified: Secondary | ICD-10-CM | POA: Diagnosis not present

## 2019-10-08 DIAGNOSIS — K5790 Diverticulosis of intestine, part unspecified, without perforation or abscess without bleeding: Secondary | ICD-10-CM | POA: Diagnosis not present

## 2019-10-08 DIAGNOSIS — I700 Atherosclerosis of aorta: Secondary | ICD-10-CM | POA: Diagnosis not present

## 2019-10-08 DIAGNOSIS — G629 Polyneuropathy, unspecified: Secondary | ICD-10-CM | POA: Diagnosis not present

## 2019-10-08 DIAGNOSIS — I119 Hypertensive heart disease without heart failure: Secondary | ICD-10-CM | POA: Diagnosis not present

## 2019-10-08 DIAGNOSIS — E538 Deficiency of other specified B group vitamins: Secondary | ICD-10-CM | POA: Diagnosis not present

## 2019-10-08 DIAGNOSIS — G8929 Other chronic pain: Secondary | ICD-10-CM | POA: Diagnosis not present

## 2019-10-08 DIAGNOSIS — M1611 Unilateral primary osteoarthritis, right hip: Secondary | ICD-10-CM | POA: Diagnosis not present

## 2019-10-08 DIAGNOSIS — D649 Anemia, unspecified: Secondary | ICD-10-CM | POA: Diagnosis not present

## 2019-10-08 DIAGNOSIS — M1711 Unilateral primary osteoarthritis, right knee: Secondary | ICD-10-CM | POA: Diagnosis not present

## 2019-10-08 DIAGNOSIS — M48062 Spinal stenosis, lumbar region with neurogenic claudication: Secondary | ICD-10-CM | POA: Diagnosis not present

## 2019-10-08 DIAGNOSIS — M19012 Primary osteoarthritis, left shoulder: Secondary | ICD-10-CM | POA: Diagnosis not present

## 2019-10-08 DIAGNOSIS — I739 Peripheral vascular disease, unspecified: Secondary | ICD-10-CM | POA: Diagnosis not present

## 2019-10-08 DIAGNOSIS — K219 Gastro-esophageal reflux disease without esophagitis: Secondary | ICD-10-CM | POA: Diagnosis not present

## 2019-10-10 NOTE — Telephone Encounter (Signed)
Received HH evaluation would benefit from DME lift chair, I have placed order.

## 2019-10-13 DIAGNOSIS — K219 Gastro-esophageal reflux disease without esophagitis: Secondary | ICD-10-CM | POA: Diagnosis not present

## 2019-10-13 DIAGNOSIS — K5790 Diverticulosis of intestine, part unspecified, without perforation or abscess without bleeding: Secondary | ICD-10-CM | POA: Diagnosis not present

## 2019-10-13 DIAGNOSIS — I119 Hypertensive heart disease without heart failure: Secondary | ICD-10-CM | POA: Diagnosis not present

## 2019-10-13 DIAGNOSIS — I739 Peripheral vascular disease, unspecified: Secondary | ICD-10-CM | POA: Diagnosis not present

## 2019-10-13 DIAGNOSIS — M1611 Unilateral primary osteoarthritis, right hip: Secondary | ICD-10-CM | POA: Diagnosis not present

## 2019-10-13 DIAGNOSIS — G629 Polyneuropathy, unspecified: Secondary | ICD-10-CM | POA: Diagnosis not present

## 2019-10-13 DIAGNOSIS — E538 Deficiency of other specified B group vitamins: Secondary | ICD-10-CM | POA: Diagnosis not present

## 2019-10-13 DIAGNOSIS — M1711 Unilateral primary osteoarthritis, right knee: Secondary | ICD-10-CM | POA: Diagnosis not present

## 2019-10-13 DIAGNOSIS — D649 Anemia, unspecified: Secondary | ICD-10-CM | POA: Diagnosis not present

## 2019-10-13 DIAGNOSIS — G8929 Other chronic pain: Secondary | ICD-10-CM | POA: Diagnosis not present

## 2019-10-13 DIAGNOSIS — I700 Atherosclerosis of aorta: Secondary | ICD-10-CM | POA: Diagnosis not present

## 2019-10-13 DIAGNOSIS — M19012 Primary osteoarthritis, left shoulder: Secondary | ICD-10-CM | POA: Diagnosis not present

## 2019-10-13 DIAGNOSIS — M48062 Spinal stenosis, lumbar region with neurogenic claudication: Secondary | ICD-10-CM | POA: Diagnosis not present

## 2019-10-13 NOTE — Telephone Encounter (Signed)
CMN for lift mechanism placed in PCP's box for completion. [name], BSN, RN-BC

## 2019-10-14 ENCOUNTER — Telehealth: Payer: Self-pay | Admitting: Internal Medicine

## 2019-10-14 NOTE — Telephone Encounter (Signed)
Calling checking on lift chair

## 2019-10-14 NOTE — Telephone Encounter (Signed)
Returned call to [place] at [employer] where patient resides. Explained that insurance pays 80/20 of the lift mechanism only but this is not separate from the chair. Explained that he can take the signed CMN and order to a DME retail store. She requested these be faxed to her at [phone]. This has been done and fax confirmation receipt received. [name], BSN, RN-BC

## 2019-10-16 DIAGNOSIS — D649 Anemia, unspecified: Secondary | ICD-10-CM | POA: Diagnosis not present

## 2019-10-16 DIAGNOSIS — I739 Peripheral vascular disease, unspecified: Secondary | ICD-10-CM | POA: Diagnosis not present

## 2019-10-16 DIAGNOSIS — K5790 Diverticulosis of intestine, part unspecified, without perforation or abscess without bleeding: Secondary | ICD-10-CM | POA: Diagnosis not present

## 2019-10-16 DIAGNOSIS — M48062 Spinal stenosis, lumbar region with neurogenic claudication: Secondary | ICD-10-CM | POA: Diagnosis not present

## 2019-10-16 DIAGNOSIS — M1611 Unilateral primary osteoarthritis, right hip: Secondary | ICD-10-CM | POA: Diagnosis not present

## 2019-10-16 DIAGNOSIS — M1711 Unilateral primary osteoarthritis, right knee: Secondary | ICD-10-CM | POA: Diagnosis not present

## 2019-10-16 DIAGNOSIS — E538 Deficiency of other specified B group vitamins: Secondary | ICD-10-CM | POA: Diagnosis not present

## 2019-10-16 DIAGNOSIS — G8929 Other chronic pain: Secondary | ICD-10-CM | POA: Diagnosis not present

## 2019-10-16 DIAGNOSIS — I119 Hypertensive heart disease without heart failure: Secondary | ICD-10-CM | POA: Diagnosis not present

## 2019-10-16 DIAGNOSIS — I700 Atherosclerosis of aorta: Secondary | ICD-10-CM | POA: Diagnosis not present

## 2019-10-16 DIAGNOSIS — M19012 Primary osteoarthritis, left shoulder: Secondary | ICD-10-CM | POA: Diagnosis not present

## 2019-10-16 DIAGNOSIS — K219 Gastro-esophageal reflux disease without esophagitis: Secondary | ICD-10-CM | POA: Diagnosis not present

## 2019-10-16 DIAGNOSIS — G629 Polyneuropathy, unspecified: Secondary | ICD-10-CM | POA: Diagnosis not present

## 2019-10-20 DIAGNOSIS — G8929 Other chronic pain: Secondary | ICD-10-CM | POA: Diagnosis not present

## 2019-10-20 DIAGNOSIS — D649 Anemia, unspecified: Secondary | ICD-10-CM | POA: Diagnosis not present

## 2019-10-20 DIAGNOSIS — I700 Atherosclerosis of aorta: Secondary | ICD-10-CM | POA: Diagnosis not present

## 2019-10-20 DIAGNOSIS — I119 Hypertensive heart disease without heart failure: Secondary | ICD-10-CM | POA: Diagnosis not present

## 2019-10-20 DIAGNOSIS — M1711 Unilateral primary osteoarthritis, right knee: Secondary | ICD-10-CM | POA: Diagnosis not present

## 2019-10-20 DIAGNOSIS — G629 Polyneuropathy, unspecified: Secondary | ICD-10-CM | POA: Diagnosis not present

## 2019-10-20 DIAGNOSIS — M19012 Primary osteoarthritis, left shoulder: Secondary | ICD-10-CM | POA: Diagnosis not present

## 2019-10-20 DIAGNOSIS — E538 Deficiency of other specified B group vitamins: Secondary | ICD-10-CM | POA: Diagnosis not present

## 2019-10-20 DIAGNOSIS — K219 Gastro-esophageal reflux disease without esophagitis: Secondary | ICD-10-CM | POA: Diagnosis not present

## 2019-10-20 DIAGNOSIS — K5790 Diverticulosis of intestine, part unspecified, without perforation or abscess without bleeding: Secondary | ICD-10-CM | POA: Diagnosis not present

## 2019-10-20 DIAGNOSIS — M1611 Unilateral primary osteoarthritis, right hip: Secondary | ICD-10-CM | POA: Diagnosis not present

## 2019-10-20 DIAGNOSIS — I739 Peripheral vascular disease, unspecified: Secondary | ICD-10-CM | POA: Diagnosis not present

## 2019-10-20 DIAGNOSIS — M48062 Spinal stenosis, lumbar region with neurogenic claudication: Secondary | ICD-10-CM | POA: Diagnosis not present

## 2019-10-23 ENCOUNTER — Other Ambulatory Visit: Payer: Self-pay | Admitting: *Deleted

## 2019-10-23 MED ORDER — PANTOPRAZOLE SODIUM 40 MG PO TBEC: 40.0000 mg | DELAYED_RELEASE_TABLET | Freq: Two times a day (BID) | ORAL | 0 refills | Status: DC

## 2019-10-23 NOTE — Telephone Encounter (Signed)
Next appt scheduled 5/5 with PCP.

## 2019-10-24 ENCOUNTER — Other Ambulatory Visit: Payer: Self-pay | Admitting: Internal Medicine

## 2019-10-24 DIAGNOSIS — M1711 Unilateral primary osteoarthritis, right knee: Secondary | ICD-10-CM

## 2019-10-24 NOTE — Telephone Encounter (Signed)
Last rx written  09/12/19 (2 of 2) Last OV 3/19 with [facility]. Next OV  10/30/19. UDS 08/28/17.

## 2019-10-27 DIAGNOSIS — M1611 Unilateral primary osteoarthritis, right hip: Secondary | ICD-10-CM | POA: Diagnosis not present

## 2019-10-27 DIAGNOSIS — M48062 Spinal stenosis, lumbar region with neurogenic claudication: Secondary | ICD-10-CM | POA: Diagnosis not present

## 2019-10-27 DIAGNOSIS — E538 Deficiency of other specified B group vitamins: Secondary | ICD-10-CM | POA: Diagnosis not present

## 2019-10-27 DIAGNOSIS — M1711 Unilateral primary osteoarthritis, right knee: Secondary | ICD-10-CM | POA: Diagnosis not present

## 2019-10-27 DIAGNOSIS — I700 Atherosclerosis of aorta: Secondary | ICD-10-CM | POA: Diagnosis not present

## 2019-10-27 DIAGNOSIS — K219 Gastro-esophageal reflux disease without esophagitis: Secondary | ICD-10-CM | POA: Diagnosis not present

## 2019-10-27 DIAGNOSIS — G8929 Other chronic pain: Secondary | ICD-10-CM | POA: Diagnosis not present

## 2019-10-27 DIAGNOSIS — M19012 Primary osteoarthritis, left shoulder: Secondary | ICD-10-CM | POA: Diagnosis not present

## 2019-10-27 DIAGNOSIS — I739 Peripheral vascular disease, unspecified: Secondary | ICD-10-CM | POA: Diagnosis not present

## 2019-10-27 DIAGNOSIS — I119 Hypertensive heart disease without heart failure: Secondary | ICD-10-CM | POA: Diagnosis not present

## 2019-10-27 DIAGNOSIS — G629 Polyneuropathy, unspecified: Secondary | ICD-10-CM | POA: Diagnosis not present

## 2019-10-27 DIAGNOSIS — K5790 Diverticulosis of intestine, part unspecified, without perforation or abscess without bleeding: Secondary | ICD-10-CM | POA: Diagnosis not present

## 2019-10-27 DIAGNOSIS — D649 Anemia, unspecified: Secondary | ICD-10-CM | POA: Diagnosis not present

## 2019-10-30 ENCOUNTER — Telehealth: Payer: Self-pay | Admitting: *Deleted

## 2019-10-30 ENCOUNTER — Ambulatory Visit: Payer: Medicare Other | Admitting: Internal Medicine

## 2019-10-30 ENCOUNTER — Encounter: Payer: Self-pay | Admitting: Internal Medicine

## 2019-10-30 NOTE — Telephone Encounter (Signed)
Pt did not come to his appt this am ; called pt to re-schedule - no answer, left message to call the office.

## 2019-11-03 DIAGNOSIS — M1611 Unilateral primary osteoarthritis, right hip: Secondary | ICD-10-CM | POA: Diagnosis not present

## 2019-11-03 DIAGNOSIS — K219 Gastro-esophageal reflux disease without esophagitis: Secondary | ICD-10-CM | POA: Diagnosis not present

## 2019-11-03 DIAGNOSIS — D649 Anemia, unspecified: Secondary | ICD-10-CM | POA: Diagnosis not present

## 2019-11-03 DIAGNOSIS — I119 Hypertensive heart disease without heart failure: Secondary | ICD-10-CM | POA: Diagnosis not present

## 2019-11-03 DIAGNOSIS — E538 Deficiency of other specified B group vitamins: Secondary | ICD-10-CM | POA: Diagnosis not present

## 2019-11-03 DIAGNOSIS — I739 Peripheral vascular disease, unspecified: Secondary | ICD-10-CM | POA: Diagnosis not present

## 2019-11-03 DIAGNOSIS — G629 Polyneuropathy, unspecified: Secondary | ICD-10-CM | POA: Diagnosis not present

## 2019-11-03 DIAGNOSIS — K5790 Diverticulosis of intestine, part unspecified, without perforation or abscess without bleeding: Secondary | ICD-10-CM | POA: Diagnosis not present

## 2019-11-03 DIAGNOSIS — M48062 Spinal stenosis, lumbar region with neurogenic claudication: Secondary | ICD-10-CM | POA: Diagnosis not present

## 2019-11-03 DIAGNOSIS — G8929 Other chronic pain: Secondary | ICD-10-CM | POA: Diagnosis not present

## 2019-11-03 DIAGNOSIS — M19012 Primary osteoarthritis, left shoulder: Secondary | ICD-10-CM | POA: Diagnosis not present

## 2019-11-03 DIAGNOSIS — M1711 Unilateral primary osteoarthritis, right knee: Secondary | ICD-10-CM | POA: Diagnosis not present

## 2019-11-03 DIAGNOSIS — I700 Atherosclerosis of aorta: Secondary | ICD-10-CM | POA: Diagnosis not present

## 2019-11-07 ENCOUNTER — Other Ambulatory Visit: Payer: Self-pay | Admitting: Internal Medicine

## 2019-11-07 DIAGNOSIS — E8770 Fluid overload, unspecified: Secondary | ICD-10-CM

## 2019-11-27 ENCOUNTER — Ambulatory Visit (INDEPENDENT_AMBULATORY_CARE_PROVIDER_SITE_OTHER): Payer: Medicare Other | Admitting: Internal Medicine

## 2019-11-27 ENCOUNTER — Telehealth: Payer: Self-pay | Admitting: Internal Medicine

## 2019-11-27 ENCOUNTER — Other Ambulatory Visit: Payer: Self-pay

## 2019-11-27 DIAGNOSIS — M1711 Unilateral primary osteoarthritis, right knee: Secondary | ICD-10-CM | POA: Diagnosis not present

## 2019-11-27 DIAGNOSIS — M25561 Pain in right knee: Secondary | ICD-10-CM

## 2019-11-27 DIAGNOSIS — G8929 Other chronic pain: Secondary | ICD-10-CM

## 2019-11-27 NOTE — Progress Notes (Signed)
[facility] Internal [facility] Residency Telephone Encounter Continuity Care Appointment  HPI:   This telephone encounter was created for [name] on 11/27/2019 for the following purpose/cc evaluation of his right knee pain and request for knee brace. .  He has a history of chronic right knee pain that is associated with weakness that occurs after walking for a prolonged period of time. He was seen in clinic on 09/12/19 for similar right knee pain, seemed to be consistent with osteoarthritis at that time, counseled on weight loss, referral for PT was placed, and patient was continued on Oxycodone-acetaminophen 10-325 mg q8 hours.   He was inquiring about a knee brace, he reports seeing someone with a brace and was wanting to try this. He reports that it would help with the pain in his leg. He reports right knee pain that worse with walking, his leg will give out and is looking for additional support. He reports that it's due to his arthritis. He reports that he has been having some falls. Last fall was about 3 weeks ago. He has never had a brace before. Uses a walker to get around. He reports that the pain is all over his knee, no specific area. He is not interested in a knee replacement, he reports that he is not sure if a steroid injection is needed at this time. Discussed that there is not a lot of evidence that a knee brace is beneficial, and that we would recommend steroid injections or surgery. Niece and patient were still interested in getting a knee brace.    Past Medical History:  Past Medical History:  Diagnosis Date  . 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  Excellent symptomatic response to LES Botox injections.   . Anemia 08/20/2018  . AVM (arteriovenous malformation) of colon 08/26/2013   Cecum X 3, without bleeding on colonoscopy February 2015   . B12 deficiency 10/01/2011   Discovered  to 2 progressive neurologic pain and dysfunction.  Diagnosis established March 2013.  Requires the following treatment: B12 IM monthly until level normalizes. After that, oral supplementation.   . Cataract   . Chronic venous insufficiency 11/28/2013  . Constipation due to opioid therapy 03/11/2015  . Deep venous thrombosis ([place]) 05/31/2018   Right lower extremity, unprovoked  . Diverticulosis 08/26/2013  . Essential hypertension 06/15/2006  . Family history of malignant neoplasm of gastrointestinal tract 06/30/2013   Father with colon cancer age 16   . Gastroesophageal reflux disease 02/04/2007  . GI bleeding 08/21/2018  . Internal hemorrhoids 08/26/2013  . Left posterior subcapsular cataract 10/03/2013   s/p yag laser capsulotomy 10/03/2013   . 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  Complicated by chronic low back pain and neuropathy.  Nerve conduction study (10/19/11): Diffusely low motor amplitudes, with primarily involvement of the peroneal and posterior tibial nerves. No evidence of generalized peripheral neuropathy. Findings could be c/w bil multilevel lumbosacral radiculopathies or a primary motor neuropathy.   . Mild aortic valve stenosis 10/09/2006   Echo (12/09/2009): Valve area: 2.03 cm2   . Neuromuscular disorder ([place])   . Neuropathy   . Obesity, Class I, BMI 30-34.9 04/05/2012  . Onychomycosis of toenail 06/05/2014  . Osteoarthritis 11/25/2009   Right knee (medial compartment), right hip, left shoulder   .  Peripheral neuropathy   . 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 Trigger little finger of right hand 03/11/2015      ROS:   Right knee pain and weakness. Denies fevers, chills, nausea, vomiting, headaches, lightheadedness, dizziness, or other symptoms.    Assessment / Plan /  Recommendations:   Please see A&P under problem oriented charting for assessment of the patient's acute and chronic medical conditions.   As always, pt is advised that if symptoms worsen or new symptoms arise, they should go to an urgent care facility or to to ER for further evaluation.   Consent and Medical Decision Making:   Patient discussed with Dr. Philipp Ovens  This is a telephone encounter between [name] and [name] on 11/27/2019 for right knee pain and weakness and request for knee brace. The visit was conducted with the patient located at home and [name] at [facility]. The patient's identity was confirmed using their DOB and current address. The patient has consented to being evaluated through a telephone encounter and understands the associated risks (an examination cannot be done and the patient may need to come in for an appointment) / benefits (allows the patient to remain at home, decreasing exposure to coronavirus). I personally spent 10 minutes on medical discussion.

## 2019-11-27 NOTE — Telephone Encounter (Signed)
Returned call to patient to discuss request for leg brace. No answer. Left message on VM requesting return call. patient will need appt to discuss. This can be done by telehealth. [name], BSN, RN-BC

## 2019-11-27 NOTE — Telephone Encounter (Signed)
Pt is requesting a leg brace; pls contact [phone]

## 2019-11-27 NOTE — Telephone Encounter (Addendum)
Patient and niece called back. Patient states he needs a right knee brace. Telehealth appt made today in [facility] to discuss need. Please call niece, Laurent Braxton at [phone].   Call placed to Latina at [facility]. She will fax order form today. [name], BSN, RN-BC

## 2019-11-28 NOTE — Assessment & Plan Note (Signed)
He has a history of chronic right knee pain that is associated with weakness that occurs after walking for a prolonged period of time. He was seen in clinic on 09/12/19 for similar right knee pain, seemed to be consistent with osteoarthritis at that time, counseled on weight loss, referral for PT was placed, and patient was continued on Oxycodone-acetaminophen 10-325 mg q8 hours.   He was inquiring about a knee brace, he reports seeing someone with a brace and was wanting to try this. He reports that it would help with the pain in his leg. He reports right knee pain that worse with walking, his leg will give out and is looking for additional support. He reports that it's due to his arthritis. He reports that he has been having some falls. Last fall was about 3 weeks ago. He has never had a brace before. Uses a walker to get around. He reports that the pain is all over his knee, no specific area. He is not interested in a knee replacement, he reports that he is not sure if a steroid injection is needed at this time. Discussed that there is not a lot of evidence that a knee brace is beneficial, and that we would recommend steroid injections or surgery. Niece and patient were still interested in getting a knee brace.   -Discussed the lack of evidence for a knee brace but patient was still interested in trying it.  -Placed DME order for knee brace -Continue PT -Continue percocet 10-325 q8 PRN -F/u with PCP to have further discussion about other treatment options, including steroid injections vs surgery

## 2019-11-30 DIAGNOSIS — M1711 Unilateral primary osteoarthritis, right knee: Secondary | ICD-10-CM | POA: Diagnosis not present

## 2019-12-01 NOTE — Telephone Encounter (Signed)
Latina from [facility] rtn call about the patient Knee brace orders. Please call back to [phone] to follow up.

## 2019-12-01 NOTE — Progress Notes (Signed)
Internal Medicine Clinic Attending  Case discussed with Dr. Krienke at the time of the visit.  We reviewed the resident's history and exam and pertinent patient test results.  I agree with the assessment, diagnosis, and plan of care documented in the resident's note.    

## 2019-12-02 NOTE — Telephone Encounter (Addendum)
Signed order for right knee brace faxed to Latina at [facility] at [phone]. Fax confirmation receipt received. Attempted to notify Latina, however, no answer and VM is not identified. [name], BSN, RN-BC

## 2019-12-06 DIAGNOSIS — M1711 Unilateral primary osteoarthritis, right knee: Secondary | ICD-10-CM | POA: Diagnosis not present

## 2019-12-08 ENCOUNTER — Other Ambulatory Visit: Payer: Self-pay | Admitting: Internal Medicine

## 2019-12-08 DIAGNOSIS — M1711 Unilateral primary osteoarthritis, right knee: Secondary | ICD-10-CM

## 2019-12-08 NOTE — Telephone Encounter (Signed)
Due for follow up with me, please schedule sometime in July

## 2019-12-11 ENCOUNTER — Ambulatory Visit (INDEPENDENT_AMBULATORY_CARE_PROVIDER_SITE_OTHER): Payer: Medicare Other | Admitting: Podiatry

## 2019-12-11 DIAGNOSIS — Z5329 Procedure and treatment not carried out because of patient's decision for other reasons: Secondary | ICD-10-CM

## 2019-12-11 NOTE — Progress Notes (Signed)
No show for appt. 

## 2019-12-22 ENCOUNTER — Other Ambulatory Visit: Payer: Self-pay

## 2019-12-22 ENCOUNTER — Ambulatory Visit (INDEPENDENT_AMBULATORY_CARE_PROVIDER_SITE_OTHER): Payer: Medicare Other | Admitting: Podiatry

## 2019-12-22 ENCOUNTER — Encounter: Payer: Self-pay | Admitting: Podiatry

## 2019-12-22 VITALS — Temp 97.1°F

## 2019-12-22 DIAGNOSIS — I872 Venous insufficiency (chronic) (peripheral): Secondary | ICD-10-CM | POA: Diagnosis not present

## 2019-12-22 DIAGNOSIS — R600 Localized edema: Secondary | ICD-10-CM

## 2019-12-22 DIAGNOSIS — L97522 Non-pressure chronic ulcer of other part of left foot with fat layer exposed: Secondary | ICD-10-CM

## 2019-12-22 DIAGNOSIS — M79672 Pain in left foot: Secondary | ICD-10-CM

## 2019-12-22 DIAGNOSIS — L84 Corns and callosities: Secondary | ICD-10-CM

## 2019-12-22 MED ORDER — SANTYL 250 UNIT/GM EX OINT: 1.0000 "application " | TOPICAL_OINTMENT | Freq: Every day | CUTANEOUS | 0 refills | Status: AC

## 2019-12-22 NOTE — Progress Notes (Signed)
  Subjective:  Patient ID: [name], male    DOB: 18-Sep-1945,  MRN: [id number]  Chief Complaint  Patient presents with  . Wound Check    L dorsal midfoot, lateral side. Pt stated, "It's been swelling more in the past couple of days. It's not healing. Clear drainage - no pus. No antibiotics. A little pain sometimes. No fever/chills/N&V/foul odor".    74 y.o. male presents for wound care. Hx confirmed with patient.  Previously known to Dr. March Rummage, previously was treated with a wound VAC.  We have not seen him since March 2021.  His primary complaint today is edema, and the wound is still present.  He is here today with a Psychologist, sport and exercise. Objective:  Physical Exam: Wound Location: left lateral foot Wound Measurement: 1.2cm x 0.5cm x 0.51mm Wound Base: Mixed Granular/Fibrotic Peri-wound: Normal Exudate: Scant/small amount Serosanguinous exudate wound without warmth, erythema, signs of acute infection and appears improved compared to last recheck  Appears similar to previous visits.   Bilateral +2 pitting edema. Palpable pedal pulses.  Assessment:   1. Chronic venous insufficiency   2. Ulcer of left foot with fat layer exposed ([place])   3. Callus of foot   4. Pain in left foot      Plan:  Patient was evaluated and treated and all questions answered.  Ulcer Left Lateral Foot -Dressing applied consisting of Antibiotic ointment, foam border dressing. -Santyl ordered, home health aide will apply this at home -Wound stable -Wound cleansed and debrided -Venous reflux ultrasound will be ordered, refer to [facility] as needed.  Procedure: Selective Debridement of Wound Rationale: Removal of devitalized tissue from the wound to promote healing.  Pre-Debridement Wound Measurements: 1.2 cm x 0.5 cm x 0.2 cm  Post-Debridement Wound Measurements: same as pre-debridement. Type of Debridement: sharp selective with sterile ring curette Tissue Removed:  Devitalized soft-tissue Dressing: Dry, sterile, compression dressing. Disposition: Patient tolerated procedure well. Patient to return in 4 weeks to review [place] and wound check    Return in about 4 weeks (around 01/19/2020) for with myself or Dr March Rummage for wound check, review of ultrasound.

## 2019-12-23 ENCOUNTER — Telehealth: Payer: Self-pay | Admitting: *Deleted

## 2019-12-23 DIAGNOSIS — I872 Venous insufficiency (chronic) (peripheral): Secondary | ICD-10-CM

## 2019-12-23 DIAGNOSIS — L97522 Non-pressure chronic ulcer of other part of left foot with fat layer exposed: Secondary | ICD-10-CM

## 2019-12-23 NOTE — Telephone Encounter (Signed)
Faxed required form to [facility] with copy of Dr. Maxie Barb requested measurement.

## 2019-12-23 NOTE — Telephone Encounter (Signed)
-----   Message from Criselda Peaches, DPM sent at 12/22/2019  9:15 PM EDT ----- Regarding: Venous reflux [place] study Hi Val,  Can you order a bilateral venous reflux study for Mr. Tokarz?  In the comments if you can specify to measure velocities and diameters of greater and lesser saphenous veins in addition to evaluating for reflux.  DX is chronic venous insufficiency.  Thanks!Quita Skye

## 2019-12-26 ENCOUNTER — Telehealth: Payer: Self-pay | Admitting: Gastroenterology

## 2019-12-26 NOTE — Telephone Encounter (Signed)
Left message on voicemail that our office has not tried to call [name] and that he is not due for colon for 5 years.

## 2019-12-26 NOTE — Telephone Encounter (Signed)
I did not call this patient. He did have a colon last year and and isn't due for 5 yrs

## 2019-12-26 NOTE — Telephone Encounter (Signed)
[name] did you try to call this pt? I'm not sure what he is talking about.

## 2019-12-27 ENCOUNTER — Other Ambulatory Visit: Payer: Self-pay

## 2019-12-27 ENCOUNTER — Emergency Department (HOSPITAL_COMMUNITY)
Admission: EM | Admit: 2019-12-27 | Discharge: 2019-12-27 | Disposition: A | Discharge status: Home / Self Care | Payer: Medicare Other | Attending: Emergency Medicine | Admitting: Emergency Medicine

## 2019-12-27 ENCOUNTER — Encounter (HOSPITAL_COMMUNITY): Payer: Self-pay

## 2019-12-27 DIAGNOSIS — I110 Hypertensive heart disease with heart failure: Secondary | ICD-10-CM | POA: Insufficient documentation

## 2019-12-27 DIAGNOSIS — W19XXXA Unspecified fall, initial encounter: Secondary | ICD-10-CM | POA: Insufficient documentation

## 2019-12-27 DIAGNOSIS — I5032 Chronic diastolic (congestive) heart failure: Secondary | ICD-10-CM | POA: Insufficient documentation

## 2019-12-27 DIAGNOSIS — Z79899 Other long term (current) drug therapy: Secondary | ICD-10-CM | POA: Insufficient documentation

## 2019-12-27 DIAGNOSIS — Z743 Need for continuous supervision: Secondary | ICD-10-CM | POA: Diagnosis not present

## 2019-12-27 DIAGNOSIS — R531 Weakness: Secondary | ICD-10-CM | POA: Insufficient documentation

## 2019-12-27 DIAGNOSIS — M7989 Other specified soft tissue disorders: Secondary | ICD-10-CM | POA: Diagnosis not present

## 2019-12-27 DIAGNOSIS — Y999 Unspecified external cause status: Secondary | ICD-10-CM | POA: Insufficient documentation

## 2019-12-27 DIAGNOSIS — F17210 Nicotine dependence, cigarettes, uncomplicated: Secondary | ICD-10-CM | POA: Insufficient documentation

## 2019-12-27 DIAGNOSIS — Y9301 Activity, walking, marching and hiking: Secondary | ICD-10-CM | POA: Diagnosis not present

## 2019-12-27 DIAGNOSIS — I10 Essential (primary) hypertension: Secondary | ICD-10-CM | POA: Diagnosis not present

## 2019-12-27 DIAGNOSIS — R2243 Localized swelling, mass and lump, lower limb, bilateral: Secondary | ICD-10-CM | POA: Diagnosis present

## 2019-12-27 DIAGNOSIS — Y92009 Unspecified place in unspecified non-institutional (private) residence as the place of occurrence of the external cause: Secondary | ICD-10-CM | POA: Insufficient documentation

## 2019-12-27 LAB — CBC WITH DIFFERENTIAL/PLATELET
Abs Immature Granulocytes: 0 10*3/uL (ref 0.00–0.07)
Basophils Absolute: 0 10*3/uL (ref 0.0–0.1)
Basophils Relative: 1 %
Eosinophils Absolute: 0.1 10*3/uL (ref 0.0–0.5)
Eosinophils Relative: 2 %
HCT: 41.5 % (ref 39.0–52.0)
Hemoglobin: 13.5 g/dL (ref 13.0–17.0)
Immature Granulocytes: 0 %
Lymphocytes Relative: 31 %
Lymphs Abs: 1.3 10*3/uL (ref 0.7–4.0)
MCH: 31.2 pg (ref 26.0–34.0)
MCHC: 32.5 g/dL (ref 30.0–36.0)
MCV: 95.8 fL (ref 80.0–100.0)
Monocytes Absolute: 0.5 10*3/uL (ref 0.1–1.0)
Monocytes Relative: 11 %
Neutro Abs: 2.3 10*3/uL (ref 1.7–7.7)
Neutrophils Relative %: 55 %
Platelets: 187 10*3/uL (ref 150–400)
RBC: 4.33 MIL/uL (ref 4.22–5.81)
RDW: 16.9 % — ABNORMAL HIGH (ref 11.5–15.5)
WBC: 4.2 10*3/uL (ref 4.0–10.5)
nRBC: 0 % (ref 0.0–0.2)

## 2019-12-27 LAB — BASIC METABOLIC PANEL
Anion gap: 12 (ref 5–15)
BUN: 14 mg/dL (ref 8–23)
CO2: 26 mmol/L (ref 22–32)
Calcium: 9.8 mg/dL (ref 8.9–10.3)
Chloride: 104 mmol/L (ref 98–111)
Creatinine, Ser: 1.46 mg/dL — ABNORMAL HIGH (ref 0.61–1.24)
GFR calc Af Amer: 53 mL/min — ABNORMAL LOW (ref >60)
GFR calc non Af Amer: 46 mL/min — ABNORMAL LOW (ref >60)
Glucose, Bld: 86 mg/dL (ref 70–99)
Potassium: 3.7 mmol/L (ref 3.5–5.1)
Sodium: 142 mmol/L (ref 135–145)

## 2019-12-27 MED ORDER — FUROSEMIDE 10 MG/ML IJ SOLN
40.0000 mg | Freq: Once | INTRAMUSCULAR | Status: AC
  Administered 2019-12-27: 40 mg via INTRAVENOUS
  Filled 2019-12-27: qty 4

## 2019-12-27 MED ORDER — POTASSIUM CHLORIDE CRYS ER 20 MEQ PO TBCR
40.0000 meq | EXTENDED_RELEASE_TABLET | Freq: Once | ORAL | Status: AC
  Administered 2019-12-27: 40 meq via ORAL
  Filled 2019-12-27: qty 2

## 2019-12-27 MED ORDER — MAGNESIUM OXIDE 400 (241.3 MG) MG PO TABS
800.0000 mg | ORAL_TABLET | Freq: Once | ORAL | Status: AC
  Administered 2019-12-27: 800 mg via ORAL
  Filled 2019-12-27: qty 2

## 2019-12-27 NOTE — ED Notes (Signed)
Help get patient undress on the monitor shown the ekg to Dr Tyrone Nine patient is resting with call bell in reach

## 2019-12-27 NOTE — ED Notes (Signed)
[facility] called to cancel transport  

## 2019-12-27 NOTE — ED Triage Notes (Signed)
Pt brought to ED via EMS from home after unwitnessed fall. Pt reports he fell when getting up to get walker; states his leg gave out and that he was in the floor for about 10 mins before friend found him and called EMS. Pt denies LOC. Pt currently denies acute pain, endorses chronic knee pain. Alert and oriented x4, no obvious injuries.

## 2019-12-27 NOTE — Discharge Instructions (Signed)
Take your Lasix as prescribed.  Try and elevate your legs when you can above the level of your heart.  Try not to sleep in a chair as this can make your swelling worse.  Please follow-up with your family doctor as there are many reasons which could cause you to have trouble walking.

## 2019-12-27 NOTE — ED Notes (Signed)
Reviewed discharge paperwork with patient. Patient verbalizes understanding of discharge instructions. Patient will follow up with internal medicine regarding today's ED visit at scheduled appointment on Thursday.

## 2019-12-27 NOTE — ED Notes (Signed)
[facility] called to transport pt 

## 2019-12-27 NOTE — ED Provider Notes (Signed)
[facility] EMERGENCY DEPARTMENT Provider Note   CSN: [id number] Arrival date & time: 12/27/19  1354     History Chief Complaint  Patient presents with  . Fall    REASON HELZER is a 74 y.o. male.  74 yo M with chief complaints have weakness to his legs.  He has had this problem off and on for some time.  Is gotten to the point where he wants to know what is causing him to have issues.  He had a fall today due to weakness in his legs.  He denies any injury.  States that sometimes his legs feel like a give out on him.  Worse on the right than left.  The history is provided by the patient.  Fall This is a new problem. The current episode started 2 days ago. The problem occurs constantly. The problem has not changed since onset.Pertinent negatives include no chest pain, no abdominal pain, no headaches and no shortness of breath. Nothing aggravates the symptoms. Nothing relieves the symptoms. He has tried nothing for the symptoms. The treatment provided no relief.       Past Medical History:  Diagnosis Date  . 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  Excellent symptomatic response to LES Botox injections.   . Anemia 08/20/2018  . AVM (arteriovenous malformation) of colon 08/26/2013   Cecum X 3, without bleeding on colonoscopy February 2015   . B12 deficiency 10/01/2011   Discovered to 2 progressive neurologic pain and dysfunction.  Diagnosis established March 2013.  Requires the following treatment: B12 IM monthly until level normalizes. After that, oral supplementation.   . Cataract   . Chronic venous insufficiency 11/28/2013  . Constipation due to opioid therapy 03/11/2015  . Deep venous thrombosis ([place]) 05/31/2018   Right lower extremity, unprovoked  . Diverticulosis 08/26/2013  . Essential hypertension 06/15/2006  . Family history of malignant neoplasm of gastrointestinal tract  06/30/2013   Father with colon cancer age 12   . Gastroesophageal reflux disease 02/04/2007  . GI bleeding 08/21/2018  . Internal hemorrhoids 08/26/2013  . Left posterior subcapsular cataract 10/03/2013   s/p yag laser capsulotomy 10/03/2013   . 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  Complicated by chronic low back pain and neuropathy.  Nerve conduction study (10/19/11): Diffusely low motor amplitudes, with primarily involvement of the peroneal and posterior tibial nerves. No evidence of generalized peripheral neuropathy. Findings could be c/w bil multilevel lumbosacral radiculopathies or a primary motor neuropathy.   . Mild aortic valve stenosis 10/09/2006   Echo (12/09/2009): Valve area: 2.03 cm2   . Neuromuscular disorder ([place])   . Neuropathy   . Obesity, Class I, BMI 30-34.9 04/05/2012  . Onychomycosis of toenail 06/05/2014  . Osteoarthritis 11/25/2009   Right knee (medial compartment), right hip, left shoulder   . Peripheral neuropathy   . 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 Trigger little finger of right hand 03/11/2015    Patient Active Problem List   Diagnosis Date Noted  . Right knee pain 09/12/2019  . Pyogenic inflammation of bone ([place])   . Foot ulcer, left, with necrosis of muscle ([place])   . Left foot infection 03/11/2019  . Long term prescription opiate use 12/18/2018  .  History of iron deficiency anemia 11/02/2018  . Chronic heart failure with preserved ejection fraction ([place])   . GI bleed 11/01/2018  . Arteriovenous malformation of gastrointestinal tract   . Acute GI bleeding 08/21/2018  . Scalp laceration 06/14/2018  . Deep venous thrombosis ([place]) 05/31/2018  . Trigger little finger of right hand 03/11/2015  . Constipation due to opioid therapy 03/11/2015  .  Onychomycosis of toenail 06/05/2014  . Chronic venous insufficiency 11/28/2013  . Diverticulosis 08/26/2013  . Internal hemorrhoids 08/26/2013  . AVM (arteriovenous malformation) of colon 08/26/2013  . Achalasia 05/05/2013  . Healthcare maintenance 04/27/2013  . Rhegmatogenous retinal detachment of right eye 03/18/2013  . Abnormality of gait 10/02/2012  . Obesity, Class II, BMI 35-39.9 04/05/2012  . 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 B12 deficiency 10/01/2011  . Asteatotic eczema 09/21/2011  . Illiteracy 09/14/2010  . Osteoarthritis of right knee 11/25/2009  . Gastroesophageal reflux disease without esophagitis 02/04/2007  . Moderate calcific aortic stenosis 10/09/2006  . Lumbar stenosis with neurogenic claudication 06/16/2006  . Essential hypertension 06/15/2006    Past Surgical History:  Procedure Laterality Date  . BIOPSY  11/06/2018   Procedure: BIOPSY;  Surgeon: Rush Landmark Telford Nab., MD;  Location: [facility];  Service: Gastroenterology;;  . BONE BIOPSY Left 03/26/2019   Procedure: BONE BIOPSY;  Surgeon: Evelina Bucy, DPM;  Location: [facility];  Service: Podiatry;  Laterality: Left;  . BOTOX INJECTION N/A 09/24/2013   Procedure: BOTOX INJECTION;  Surgeon: Inda Castle, MD;  [facility];  Service: Endoscopy;  Laterality: N/A;  . BOTOX INJECTION N/A 07/17/2017   Procedure: BOTOX INJECTION;  Surgeon: Doran Stabler, MD;  [facility];  Service: Gastroenterology;  Laterality: N/A;  . BOTOX INJECTION N/A 04/15/2018   Procedure: BOTOX INJECTION;  Surgeon: Doran Stabler, MD;  [facility];  Service: Gastroenterology;  Laterality: N/A;  . CAPSULOTOMY Left 10/03/2013   Procedure: YAG CAPSULOTOMY OF LEFT EYE;  Surgeon: Myrtha Mantis., MD;  Location: [facility];  Service: Ophthalmology;  Laterality: Left;  . CATARACT EXTRACTION  05/01/2012   left eye  . CATARACT EXTRACTION W/PHACO  11/08/2011    Procedure: CATARACT EXTRACTION PHACO AND INTRAOCULAR LENS PLACEMENT (IOC);  Surgeon: Adonis Brook, MD;  Location: [facility];  Service: Ophthalmology;  Laterality: Right;  . CATARACT EXTRACTION W/PHACO  05/01/2012   Procedure: CATARACT EXTRACTION PHACO AND INTRAOCULAR LENS PLACEMENT (IOC);  Surgeon: Adonis Brook, MD;  Location: [facility];  Service: Ophthalmology;  Laterality: Left;  . COLONOSCOPY WITH PROPOFOL N/A 08/23/2018   Procedure: COLONOSCOPY WITH PROPOFOL;  Surgeon: Rush Landmark Telford Nab., MD;  Location: [facility];  Service: Gastroenterology;  Laterality: N/A;  . COLONOSCOPY WITH PROPOFOL N/A 11/06/2018   Procedure: COLONOSCOPY WITH PROPOFOL;  Surgeon: Rush Landmark Telford Nab., MD;  Location: [facility];  Service: Gastroenterology;  Laterality: N/A;  . colonscopy    . ENTEROSCOPY N/A 08/23/2018   Procedure: ENTEROSCOPY;  Surgeon: Mansouraty, Telford Nab., MD;  Location: [facility];  Service: Gastroenterology;  Laterality: N/A;  . ENTEROSCOPY N/A 11/06/2018   Procedure: ENTEROSCOPY;  Surgeon: Rush Landmark Telford Nab., MD;  Location: [facility];  Service: Gastroenterology;  Laterality: N/A;  . ESOPHAGEAL MANOMETRY N/A 08/25/2013   Procedure: ESOPHAGEAL MANOMETRY (EM);  Surgeon: Inda Castle, MD;  [facility];  Service: Endoscopy;  Laterality: N/A;  . ESOPHAGOGASTRODUODENOSCOPY N/A 09/24/2013   Procedure: ESOPHAGOGASTRODUODENOSCOPY (EGD);  Surgeon: Inda Castle, MD;  Location: [facility];  Service: Endoscopy;  Laterality: N/A;  . ESOPHAGOGASTRODUODENOSCOPY N/A 02/17/2014   Procedure: ESOPHAGOGASTRODUODENOSCOPY (EGD);  Surgeon: Inda Castle, MD;  Location: [facility];  Service: Endoscopy;  Laterality: N/A;  . ESOPHAGOGASTRODUODENOSCOPY (EGD) WITH PROPOFOL N/A 07/17/2017   Procedure: ESOPHAGOGASTRODUODENOSCOPY (EGD) WITH PROPOFOL;  Surgeon: Doran Stabler, MD;  [facility];  Service: Gastroenterology;  Laterality: N/A;  with BOTOX  . ESOPHAGOGASTRODUODENOSCOPY (EGD)  WITH PROPOFOL N/A 04/15/2018   Procedure: ESOPHAGOGASTRODUODENOSCOPY (EGD) WITH PROPOFOL;  Surgeon: Doran Stabler, MD;  [facility];  Service: Gastroenterology;  Laterality: N/A;  . EYE SURGERY     cat ext right  . FRACTURE SURGERY     left shoulder  . GAS/FLUID EXCHANGE Right 07/02/2013   Procedure: GAS/FLUID EXCHANGE;  Surgeon: Adonis Brook, MD;  Location: [facility];  Service: Ophthalmology;  Laterality: Right;  . GRAFT APPLICATION Left 1/66/0630   Procedure: SKIN GRAFT SUBSTITUTE;  Surgeon: Evelina Bucy, DPM;  [facility];  Service: Podiatry;  Laterality: Left;  . HEMOSTASIS CLIP PLACEMENT  11/06/2018   Procedure: HEMOSTASIS CLIP PLACEMENT;  Surgeon: Irving Copas., MD;  Location: [facility];  Service: Gastroenterology;;  . HOT HEMOSTASIS N/A 08/23/2018   Procedure: HOT HEMOSTASIS (ARGON PLASMA COAGULATION/BICAP);  Surgeon: Irving Copas., MD;  Location: [facility];  Service: Gastroenterology;  Laterality: N/A;  . HOT HEMOSTASIS N/A 11/06/2018   Procedure: HOT HEMOSTASIS (ARGON PLASMA COAGULATION/BICAP);  Surgeon: Irving Copas., MD;  Location: [facility];  Service: Gastroenterology;  Laterality: N/A;  . PARS PLANA VITRECTOMY Right 07/02/2013   Procedure: PARS PLANA VITRECTOMY WITH 23 GAUGE WITH MEMBRANE PEEL AND ENDOLASER AND SILICONE OIL;  Surgeon: Adonis Brook, MD;  Location: [facility];  Service: Ophthalmology;  Laterality: Right;  . POLYPECTOMY  08/23/2018   Procedure: POLYPECTOMY;  Surgeon: Mansouraty, Telford Nab., MD;  Location: [facility];  Service: Gastroenterology;;  . SCLERAL BUCKLE Right 03/15/2013   Procedure: SCLERAL BUCKLE with 23Ga Vit;  Surgeon: Adonis Brook, MD;  Location: [facility];  Service: Ophthalmology;  Laterality: Right;  . WOUND DEBRIDEMENT Left 03/26/2019   Procedure: DEBRIDEMENT WOUND;  Surgeon: Evelina Bucy, DPM;  [facility];  Service: Podiatry;  Laterality: Left;       Family History  Problem Relation Age of  Onset  . Heart disease Mother   . Colon cancer Father 35  . Kidney disease Brother        on dialysis  . Pancreatic cancer Brother   . Hypertension Sister   . Diabetes Sister   . Kidney disease Brother        on dialysis  . Diabetes Sister   . Colon cancer Brother        2 brothers with colon cancer  . Cancer Daughter 33       Died at age of 26, unknown type  . Anesthesia problems Neg Hx     Social History   Tobacco Use  . Smoking status: Current Some Day Smoker    Packs/day: 0.20    Years: 30.00    Pack years: 6.00    Types: Cigarettes    Last attempt to quit: 11/05/2010    Years since quitting: 9.1  . Smokeless tobacco: Never Used  Vaping Use  . Vaping Use: Never used  Substance Use Topics  . Alcohol use: Yes    Alcohol/week: 0.0 standard drinks    Comment: sometimes.  . Drug use: No    Home Medications Prior to Admission medications   Medication Sig Start  Date End Date Taking? Authorizing Provider  collagenase (SANTYL) ointment Apply 1 application topically daily. 12/22/19   McDonald, Adam R, DPM  COMBIGAN 0.2-0.5 % ophthalmic solution Place 1 drop into both eyes 2 (two) times daily. 04/24/16   [provider]  furosemide (LASIX) 40 MG tablet TAKE 1 TABLET BY MOUTH DAILY. 11/07/19   Lucious Groves, DO  gabapentin (NEURONTIN) 300 MG capsule Take 2 capsules (600 mg total) by mouth 3 (three) times daily. 08/18/19   Lucious Groves, DO  LUMIGAN 0.01 % SOLN  12/26/18   [provider]  oxyCODONE (OXY IR/ROXICODONE) 5 MG immediate release tablet TAKE 1 TABLET BY MOUTH EVERY 8 HOURS AS NEEDED FOR SEVERE PAIN 04/22/19   Evelina Bucy, DPM  oxyCODONE-acetaminophen (PERCOCET) 10-325 MG tablet Take 1 tablet by mouth every 8 (eight) hours as needed for pain. 06/28/19 09/11/19  Lucious Groves, DO  oxyCODONE-acetaminophen (PERCOCET) 10-325 MG tablet TAKE 1 TABLET BY MOUTH EVERY 8 HOURS AS NEEDED FOR PAIN. 12/08/19   Lucious Groves, DO  pantoprazole (PROTONIX) 40 MG  tablet Take 1 tablet (40 mg total) by mouth 2 (two) times daily. 10/23/19   Velna Ochs, MD  sorbitol 70 % solution Take 15 mLs by mouth daily as needed. Patient taking differently: Take 15 mLs by mouth daily as needed (for constipation).  03/23/17   [name], MD  verapamil (CALAN-SR) 180 MG CR tablet Take 1 tablet (180 mg total) by mouth daily. 01/29/19   Lucious Groves, DO  vitamin B-12 (CYANOCOBALAMIN) 1000 MCG tablet Take 1 tablet (1,000 mcg total) by mouth daily. 03/06/18   [name], MD    Allergies    Feraheme [ferumoxytol] and Amlodipine  Review of Systems   Review of Systems  Constitutional: Negative for chills and fever.  HENT: Negative for congestion and facial swelling.   Eyes: Negative for discharge and visual disturbance.  Respiratory: Negative for shortness of breath.   Cardiovascular: Negative for chest pain and palpitations.  Gastrointestinal: Negative for abdominal pain, diarrhea and vomiting.  Musculoskeletal: Negative for arthralgias and myalgias.  Skin: Negative for color change and rash.  Neurological: Positive for weakness (leg). Negative for tremors, syncope and headaches.  Psychiatric/Behavioral: Negative for confusion and dysphoric mood.    Physical Exam Updated Vital Signs BP 114/62   Pulse 69   Temp 98.2 F (36.8 C) (Oral)   Resp 16   Ht 6\' 2"  (1.88 m)   Wt 113.4 kg   SpO2 97%   BMI 32.10 kg/m   Physical Exam Vitals and nursing note reviewed.  Constitutional:      Appearance: He is well-developed.  HENT:     Head: Normocephalic and atraumatic.  Eyes:     Pupils: Pupils are equal, round, and reactive to light.  Neck:     Vascular: No JVD.  Cardiovascular:     Rate and Rhythm: Normal rate and regular rhythm.     Heart sounds: No murmur heard.  No friction rub. No gallop.   Pulmonary:     Effort: No respiratory distress.     Breath sounds: No wheezing.  Abdominal:     General: There is no distension.     Tenderness: There  is no abdominal tenderness. There is no guarding or rebound.  Musculoskeletal:        General: Normal range of motion.     Cervical back: Normal range of motion and neck supple.     Right lower leg: Edema present.  Left lower leg: Edema present.     Comments: 3+ to the thighs. PMS intact bilaterally.  No obvious tenderness to palpation  Skin:    Coloration: Skin is not pale.     Findings: No rash.  Neurological:     Mental Status: He is alert and oriented to person, place, and time.  Psychiatric:        Behavior: Behavior normal.     ED Results / Procedures / Treatments   Labs (all labs ordered are listed, but only abnormal results are displayed) Labs Reviewed  CBC WITH DIFFERENTIAL/PLATELET - Abnormal; Notable for the following components:      Result Value   RDW 16.9 (*)    All other components within normal limits  BASIC METABOLIC PANEL - Abnormal; Notable for the following components:   Creatinine, Ser 1.46 (*)    GFR calc non Af Amer 46 (*)    GFR calc Af Amer 53 (*)    All other components within normal limits    EKG EKG Interpretation  Date/Time:  Saturday December 27 2019 14:00:38 EDT Ventricular Rate:  72 PR Interval:    QRS Duration: 118 QT Interval:  432 QTC Calculation: 473 R Axis:   70 Text Interpretation: Sinus rhythm Atrial premature complex Prolonged PR interval Nonspecific intraventricular conduction delay Borderline low voltage, extremity leads Consider anterior infarct Nonspecific T abnormalities, lateral leads No significant change since last tracing Confirmed by Deno Etienne [phone]) on 12/27/2019 2:23:33 PM   Radiology No results found.  Procedures Procedures (including critical care time)  Medications Ordered in ED Medications  potassium chloride SA (KLOR-CON) CR tablet 40 mEq (40 mEq Oral Given 12/27/19 1451)  magnesium oxide (MAG-OX) tablet 800 mg (800 mg Oral Given 12/27/19 1451)  furosemide (LASIX) injection 40 mg (40 mg Intravenous Given 12/27/19  1451)    ED Course  I have reviewed the triage vital signs and the nursing notes.  Pertinent labs & imaging results that were available during my care of the patient were reviewed by me and considered in my medical decision making (see chart for details).    MDM Rules/Calculators/A&P                          74 yo M with a chief complaints of leg weakness.  Patient states that sometimes when he gets up to walk he feels like his legs are weak and give out on him.  Usually the right more than the left.  He is unsure what the cause of this is and decided to come into the ED for today.  Usually walks with a walker at home and was walking to get his walker and he fell down.  Denies injury in the fall.  Has some significant lower extremity edema that he thinks is worse than his baseline.  Will check basic lab work.  Give a bolus of diuretic. PCP follow up.   7:05 AM:  I have discussed the diagnosis/risks/treatment options with the patient and believe the pt to be eligible for discharge home to follow-up with PCP. We also discussed returning to the ED immediately if new or worsening sx occur. We discussed the sx which are most concerning (e.g., sudden worsening pain, fever, inability to tolerate by mouth) that necessitate immediate return. Medications administered to the patient during their visit and any new prescriptions provided to the patient are listed below.  Medications given during this visit Medications  potassium chloride SA (  KLOR-CON) CR tablet 40 mEq (40 mEq Oral Given 12/27/19 1451)  magnesium oxide (MAG-OX) tablet 800 mg (800 mg Oral Given 12/27/19 1451)  furosemide (LASIX) injection 40 mg (40 mg Intravenous Given 12/27/19 1451)     The patient appears reasonably screen and/or stabilized for discharge and I doubt any other medical condition or other [facility] requiring further screening, evaluation, or treatment in the ED at this time prior to discharge.   Final Clinical Impression(s) / ED  Diagnoses Final diagnoses:  Leg swelling    Rx / DC Orders ED Discharge Orders    None       Deno Etienne, DO 12/28/19 9470

## 2020-01-01 ENCOUNTER — Encounter: Payer: Self-pay | Admitting: Internal Medicine

## 2020-01-01 ENCOUNTER — Ambulatory Visit: Payer: Medicare Other | Admitting: Internal Medicine

## 2020-01-01 ENCOUNTER — Other Ambulatory Visit: Payer: Self-pay

## 2020-01-01 VITALS — BP 124/68 | HR 72 | Temp 98.4°F | Ht 74.0 in | Wt 262.6 lb

## 2020-01-01 DIAGNOSIS — L97523 Non-pressure chronic ulcer of other part of left foot with necrosis of muscle: Secondary | ICD-10-CM

## 2020-01-01 DIAGNOSIS — M1711 Unilateral primary osteoarthritis, right knee: Secondary | ICD-10-CM

## 2020-01-01 DIAGNOSIS — I10 Essential (primary) hypertension: Secondary | ICD-10-CM

## 2020-01-01 DIAGNOSIS — I5032 Chronic diastolic (congestive) heart failure: Secondary | ICD-10-CM

## 2020-01-01 DIAGNOSIS — Z79891 Long term (current) use of opiate analgesic: Secondary | ICD-10-CM

## 2020-01-05 ENCOUNTER — Ambulatory Visit (HOSPITAL_COMMUNITY)
Admission: RE | Admit: 2020-01-05 | Discharge: 2020-01-05 | Disposition: A | Discharge status: Home / Self Care | Payer: Medicare Other | Source: Ambulatory Visit | Attending: Podiatry | Admitting: Podiatry

## 2020-01-05 ENCOUNTER — Other Ambulatory Visit: Payer: Self-pay

## 2020-01-05 DIAGNOSIS — L97522 Non-pressure chronic ulcer of other part of left foot with fat layer exposed: Secondary | ICD-10-CM | POA: Diagnosis not present

## 2020-01-05 DIAGNOSIS — I872 Venous insufficiency (chronic) (peripheral): Secondary | ICD-10-CM | POA: Insufficient documentation

## 2020-01-07 NOTE — Assessment & Plan Note (Signed)
HPI: He has been following with podiatry for an ulcer on his left foot.  He has a venous reflux study due for completion next week.  Assessment foot ulcer suspected venous stasis as cause  Plan Continued follow-up with podiatry.

## 2020-01-07 NOTE — Assessment & Plan Note (Signed)
HPI: [name] presents today mainly for pain in his right knee.  He is recently been through physical therapy again and overall doing okay with his chronic pain medications which include oxycodone acetaminophen.  However he does note some limited mobility and all stressors anything else that can be done.  Assessment right knee osteoarthritis  Plan Steroid injection offered and completed today in office. We will have him continue his oxycodone for osteoarthritis of multiple joints including the knee hip and left shoulder.

## 2020-01-07 NOTE — Progress Notes (Signed)
  Subjective:  HPI: [name] is a 74 y.o. male who presents for f/u chronic knee pain, HTN  Please see Assessment and Plan below for the status of his chronic medical problems.  Objective:  Physical Exam: Vitals:   01/01/20 1043  BP: 124/68  Pulse: 72  Temp: 98.4 F (36.9 C)  TempSrc: Oral  SpO2: 100%  Weight: 262 lb 9.6 oz (119.1 kg)  Height: 6\' 2"  (1.88 m)   Body mass index is 33.72 kg/m. Physical Exam Vitals and nursing note reviewed.  Constitutional:      Appearance: Normal appearance.  Cardiovascular:     Rate and Rhythm: Normal rate and regular rhythm.     Pulses: Normal pulses.     Heart sounds: Murmur heard.   Pulmonary:     Effort: Pulmonary effort is normal.     Breath sounds: Normal breath sounds.  Musculoskeletal:     Right knee: No effusion. Tenderness present over the medial joint line and lateral joint line. No LCL laxity, MCL laxity, ACL laxity or PCL laxity. Normal meniscus.  Neurological:     Mental Status: He is alert.    Assessment & Plan:  See Encounters Tab for problem based charting.  Medications Ordered No orders of the defined types were placed in this encounter.  Other Orders No orders of the defined types were placed in this encounter.  Follow Up: Return in about 3 months (around 04/02/2020).   Marland KitchenPROCEDURE NOTE  PROCEDURE: right knee joint steroid injection.  PREOPERATIVE DIAGNOSIS: Osteoarthritis of the right knee.  POSTOPERATIVE DIAGNOSIS: Osteoarthritis of the right knee.  PROCEDURE: The patient was apprised of the risks and the benefits of the procedure and informed consent was obtained. Time-out procedure was performed, with confirmation of the patient's name, date of birth, and correct identification of the right knee to be injected. The patient's knee was then marked at the appropriate site for injection placement. The knee was sterilely prepped with Betadine. A 40 mg (1 milliliter) solution of Kenalog was drawn up  into a 5 mL syringe with a 2 mL of 1% lidocaine. The patient was injected with a 25-gauge needle at the anterior medial aspect of his right flexed knee. There were no complications. The patient tolerated the procedure well. There was minimal bleeding. The patient was instructed to ice his knee upon leaving clinic and refrain from overuse over the next 3 days. The patient was instructed to go to the emergency room with any usual pain, swelling, or redness occurred in the injected area. The patient was given a followup appointment to evaluate response to the injection to his increased range of motion and reduction of pain.

## 2020-01-09 NOTE — Assessment & Plan Note (Signed)
HPI: No complaints with blood pressure medications overall feels he is doing well.  Assessment essential hypertension well-controlled  Plan continue 180 mg of verapamil daily, lasix 40mg  daily

## 2020-01-09 NOTE — Assessment & Plan Note (Signed)
HPI: Limited with exertional activity mainly due to knee and joint pain.  Denies any current shortness of breath.  Intermittent lower extremity edema and has a suspected venous stasis ulceration on foot.  Assessment chronic heart failure with preserved ejection fraction  Plan Overall appears generally euvolemic suspect he has a component of venous insufficiency that is contributing to his lower extremity edema.

## 2020-01-20 ENCOUNTER — Other Ambulatory Visit: Payer: Self-pay

## 2020-01-20 DIAGNOSIS — M1711 Unilateral primary osteoarthritis, right knee: Secondary | ICD-10-CM

## 2020-01-20 MED ORDER — OXYCODONE-ACETAMINOPHEN 10-325 MG PO TABS: 1.0000 | ORAL_TABLET | Freq: Three times a day (TID) | ORAL | 0 refills | Status: DC | PRN

## 2020-01-20 NOTE — Telephone Encounter (Signed)
oxyCODONE-acetaminophen (PERCOCET) 10-325 MG tablet(Expired), refill request @  [facility], [place] Phone:  [phone]  Fax:  [phone]

## 2020-01-21 ENCOUNTER — Other Ambulatory Visit: Payer: Self-pay | Admitting: Internal Medicine

## 2020-01-22 ENCOUNTER — Other Ambulatory Visit: Payer: Self-pay

## 2020-01-22 ENCOUNTER — Ambulatory Visit (INDEPENDENT_AMBULATORY_CARE_PROVIDER_SITE_OTHER): Payer: Medicare Other | Admitting: Podiatry

## 2020-01-22 DIAGNOSIS — L97522 Non-pressure chronic ulcer of other part of left foot with fat layer exposed: Secondary | ICD-10-CM | POA: Diagnosis not present

## 2020-01-22 DIAGNOSIS — I872 Venous insufficiency (chronic) (peripheral): Secondary | ICD-10-CM

## 2020-01-22 NOTE — Progress Notes (Signed)
  Subjective:  Patient ID: [name], male    DOB: 1946/05/21,  MRN: [id number]  Chief Complaint  Patient presents with  . Foot Ulcer    pt is here for an ulcer f/u of the left foot. pt states that the foot is elevated to the touch. pt also states that he has been soakling his feet with a rag as well.    74 y.o. male presents for wound care. Hx confirmed with patient.  Objective:  Physical Exam: Wound Location: left midfoot Wound Measurement: 3x1.3x0.4 post-debridement Wound Base: Mixed Granular/Fibrotic Peri-wound: Macerated Exudate: Scant/small amount Serosanguinous exudate wound without warmth, erythema, signs of acute infection Pitting edema left lower extremity  Assessment:   1. Chronic venous insufficiency   2. Ulcer of left foot with fat layer exposed ([place])      Plan:  Patient was evaluated and treated and all questions answered.  Ulcer left midfoot -Wound does appear larger this visit -Offload ulcer with surgical shoe -Surgical shoe dispensed -Wound cleansed and debrided -Prisma passed into the wound; Telfa applied, followed by Louretta Parma boot  Procedure: Excisional Debridement of Wound Indication: Removal of non-viable soft tissue from the wound to promote healing.  Anesthesia: none Pre-Debridement Wound Measurements: 3 cm x 1.1 cm x 0.3 cm  Post-Debridement Wound Measurements: 3 cm x 1.3 cm x 0.4 cm  Type of Debridement: Sharp Excisional Tissue Removed: Non-viable soft tissue Instrumentation: 15 blade and tissue nipper Depth of Debridement: subcutaneous tissue. Technique: Sharp excisional debridement to bleeding, viable wound base.  Dressing: Dry, sterile, compression dressing. Disposition: Patient tolerated procedure well. Patient to return in 1 week for follow-up.  Venous Reflux -[place] reviewed with patient. -Multilayer compression dressing applied  Procedure: Multilayer Compression dressing Rationale: venous insufficiency, reflux Technique: Unna boot,  cast padding, Coban compression dressing applied Disposition: Patient tolerated procedure well.     Return in about 1 week (around 01/29/2020) for [employer] change.

## 2020-01-29 ENCOUNTER — Ambulatory Visit (INDEPENDENT_AMBULATORY_CARE_PROVIDER_SITE_OTHER): Payer: Medicare Other | Admitting: Podiatry

## 2020-01-29 ENCOUNTER — Other Ambulatory Visit: Payer: Self-pay

## 2020-01-29 DIAGNOSIS — R600 Localized edema: Secondary | ICD-10-CM

## 2020-01-29 DIAGNOSIS — F17200 Nicotine dependence, unspecified, uncomplicated: Secondary | ICD-10-CM

## 2020-01-29 DIAGNOSIS — E1142 Type 2 diabetes mellitus with diabetic polyneuropathy: Secondary | ICD-10-CM

## 2020-01-29 DIAGNOSIS — L97522 Non-pressure chronic ulcer of other part of left foot with fat layer exposed: Secondary | ICD-10-CM | POA: Diagnosis not present

## 2020-01-29 DIAGNOSIS — I872 Venous insufficiency (chronic) (peripheral): Secondary | ICD-10-CM | POA: Diagnosis not present

## 2020-01-30 NOTE — Progress Notes (Signed)
  Subjective:  Patient ID: [name], male    DOB: [birth year],  MRN: [id number]  Chief Complaint  Patient presents with  . Venous Insufficiency    ulcer check and Unna boot change left    74 y.o. male presents for wound care. Hx confirmed with patient.  He states he is having some pain.  He has been in an Haematologist since last week. Objective:  Physical Exam: Wound Location: left lateral foot Wound Measurement: 3.0cm x 1.0cm x 0.2cm Wound Base: Mixed Granular/Fibrotic Peri-wound: Normal Exudate: Scant/small amount Serosanguinous exudate wound without warmth, erythema, signs of acute infection   +1 pitting edema from the mid lower leg to the toes, +3 pitting edema from the mid lower leg up Palpable pedal pulses.  Assessment:   1. Chronic venous insufficiency   2. Ulcer of left foot with fat layer exposed ([place])   3. Edema of left lower extremity   4. Type 2 diabetes mellitus with peripheral neuropathy (HCC)   5. Tobacco use disorder      Plan:  Patient was evaluated and treated and all questions answered.  Ulcer Left Lateral Foot -Dressing applied consisting of povidone ointment, gauze and Unna boot -Wound cleansed and debrided   Procedure: Selective Debridement of Wound Rationale: Removal of devitalized tissue from the wound to promote healing.  Pre-Debridement Wound Measurements: 3.0 cm x 1.0 cm x 0.2 cm  Post-Debridement Wound Measurements: same as pre-debridement. Type of Debridement: sharp selective with sterile ring curette Tissue Removed: Devitalized soft-tissue Dressing: Dry, sterile, compression dressing. Disposition: Patient tolerated procedure well. Patient to return in 1 week with Dr. March Rummage  Procedure: Multilayer compression dressing Rationale: Venous insufficiency and reflux, pitting edema Technique: Unna boot, cast padding, Coban dressing applied Disposition: Patient tolerated procedure well    No follow-ups on file.

## 2020-02-06 ENCOUNTER — Ambulatory Visit (INDEPENDENT_AMBULATORY_CARE_PROVIDER_SITE_OTHER): Payer: Medicare Other | Admitting: Podiatry

## 2020-02-06 ENCOUNTER — Other Ambulatory Visit: Payer: Self-pay

## 2020-02-06 DIAGNOSIS — L97522 Non-pressure chronic ulcer of other part of left foot with fat layer exposed: Secondary | ICD-10-CM

## 2020-02-06 DIAGNOSIS — I872 Venous insufficiency (chronic) (peripheral): Secondary | ICD-10-CM

## 2020-02-06 NOTE — Progress Notes (Signed)
  Subjective:  Patient ID: [name], male    DOB: 1945-08-01,  MRN: [id number]  Chief Complaint  Patient presents with  . Wound Check    Pt states no concerns and denies fever/chills/nausea/vomiting.   74 y.o. male presents for wound care. Hx confirmed with patient.  Objective:  Physical Exam: Wound Location: left midfoot Wound Measurement: 3x1.5x0.4 Wound Base: Mixed Granular/Fibrotic Peri-wound: Macerated Exudate: Scant/small amount Serosanguinous exudate wound without warmth, erythema, signs of acute infection Pitting edema left lower extremity  Assessment:   1. Ulcer of left foot with fat layer exposed ([place])   2. Chronic venous insufficiency      Plan:  Patient was evaluated and treated and all questions answered.  Ulcer left midfoot -Wound increasing in size -Given worsening, will refer to wound care center for specialty care -Dressed with prisma -Compression dressing applied LLE for edema  Procedure: Selective Debridement of Wound Rationale: Removal of devitalized tissue from the wound to promote healing.  Pre-Debridement Wound Measurements: 3 cm x 1.5 cm x 0.4 cm  Post-Debridement Wound Measurements: same as pre-debridement. Type of Debridement: sharp selective Tissue Removed: Devitalized soft-tissue Dressing: Dry, sterile, compression dressing. Disposition: Patient tolerated procedure well. Patient to return in 1 week for follow-up.    Venous Reflux -Repeat multilayer compression dressing applied  Procedure: Multilayer Compression dressing Rationale: venous insufficiency Technique: Unna boot, cast padding, Coban compression dressing applied Disposition: Patient tolerated procedure well.      Return if symptoms worsen or fail to improve.

## 2020-03-09 ENCOUNTER — Encounter (HOSPITAL_BASED_OUTPATIENT_CLINIC_OR_DEPARTMENT_OTHER): Payer: Medicare Other | Attending: Internal Medicine | Admitting: Internal Medicine

## 2020-03-09 DIAGNOSIS — I509 Heart failure, unspecified: Secondary | ICD-10-CM | POA: Diagnosis not present

## 2020-03-09 DIAGNOSIS — T8131XA Disruption of external operation (surgical) wound, not elsewhere classified, initial encounter: Secondary | ICD-10-CM | POA: Diagnosis not present

## 2020-03-09 DIAGNOSIS — I872 Venous insufficiency (chronic) (peripheral): Secondary | ICD-10-CM | POA: Diagnosis not present

## 2020-03-09 DIAGNOSIS — Y838 Other surgical procedures as the cause of abnormal reaction of the patient, or of later complication, without mention of misadventure at the time of the procedure: Secondary | ICD-10-CM | POA: Diagnosis not present

## 2020-03-09 DIAGNOSIS — I110 Hypertensive heart disease with heart failure: Secondary | ICD-10-CM | POA: Insufficient documentation

## 2020-03-09 DIAGNOSIS — L97522 Non-pressure chronic ulcer of other part of left foot with fat layer exposed: Secondary | ICD-10-CM | POA: Diagnosis not present

## 2020-03-09 DIAGNOSIS — T8131XD Disruption of external operation (surgical) wound, not elsewhere classified, subsequent encounter: Secondary | ICD-10-CM | POA: Insufficient documentation

## 2020-03-09 NOTE — Progress Notes (Signed)
LEROY, PETTWAY ([id number]) Visit Report for 03/09/2020 Abuse/Suicide Risk Screen Details Patient Name: Date of Service: [name] 03/09/2020 9:00 A M Medical Record Number: [id number] Patient Account Number: [id number] Date of Birth/Sex: Treating RN: [birth year] (74 y.o. [name] Primary Care Jazzmine Kleiman: [name] Other Clinician: Referring Darsha Zumstein: Treating Londa Mackowski/Extender: [name] in Treatment: 0 Abuse/Suicide Risk Screen Items Answer ABUSE RISK SCREEN: Has anyone close to you tried to hurt or harm you recentlyo No Do you feel uncomfortable with anyone in your familyo No Has anyone forced you do things that you didnt want to doo No Electronic Signature(s) Signed: 03/09/2020 5:02:37 PM By: Baruch Gouty RN, BSN Entered By: Baruch Gouty on 03/09/2020 10:00:32 -------------------------------------------------------------------------------- Activities of Daily Living Details Patient Name: Date of Service: [name], [name] 03/09/2020 9:00 A M Medical Record Number: [id number] Patient Account Number: [id number] Date of Birth/Sex: Treating RN: Jul 21, 1945 (74 y.o. [name] Primary Care Manar Smalling: [name] Other Clinician: Referring Rayhaan Huster: Treating Lisaanne Lawrie/Extender: [name] in Treatment: 0 Activities of Daily Living Items Answer Activities of Daily Living (Please select one for each item) Drive Automobile Not Able T Medications ake Need Assistance Use T elephone Completely Able Care for Appearance Need Assistance Use T oilet Completely Able Bath / Shower Need Assistance Dress Self Need Assistance Feed Self Completely Able Walk Need Assistance Get In / Out Bed Completely [place] Need Assistance Shop for Self Need Assistance Electronic Signature(s) Signed: 03/09/2020 5:02:37 PM By: Baruch Gouty RN,  BSN Entered By: Baruch Gouty on 03/09/2020 10:01:22 -------------------------------------------------------------------------------- Education Screening Details Patient Name: Date of Service: [name], [name] 03/09/2020 9:00 A M Medical Record Number: [id number] Patient Account Number: [id number] Date of Birth/Sex: Treating RN: [birth year] (74 y.o. [name] Primary Care Jenny Omdahl: [name] Other Clinician: Referring Verna Desrocher: Treating Kalisha Keadle/Extender: [name] in Treatment: 0 Primary Learner Assessed: Patient Learning Preferences/Education Level/Primary Language Learning Preference: Explanation, Demonstration, Printed Material Highest Education Level: Grade School Preferred Language: English Cognitive Barrier Language Barrier: No Translator Needed: No Memory Deficit: No Emotional Barrier: No Cultural/Religious Beliefs Affecting Medical Care: No Physical Barrier Impaired Vision: Yes Glasses Impaired Hearing: No Decreased Hand dexterity: No Knowledge/Comprehension Knowledge Level: High Comprehension Level: High Ability to understand written instructions: High Ability to understand verbal instructions: High Motivation Anxiety Level: Calm Cooperation: Cooperative Education Importance: Acknowledges Need Interest in Health Problems: Asks Questions Perception: Coherent Willingness to Engage in Self-Management High Activities: Readiness to Engage in Self-Management High Activities: Electronic Signature(s) Signed: 03/09/2020 5:02:37 PM By: Baruch Gouty RN, BSN Entered By: Baruch Gouty on 03/09/2020 10:02:10 -------------------------------------------------------------------------------- Fall Risk Assessment Details Patient Name: Date of Service: [name], [name] 03/09/2020 9:00 A M Medical Record Number: [id number] Patient Account Number: [id number] Date of Birth/Sex: Treating RN: 1946/06/05 (74 y.o. [name] Primary Care Kayven Aldaco: [name] Other Clinician: Referring Alesi Zachery: Treating Jontay Maston/Extender: [name] in Treatment: 0 Fall Risk Assessment Items Have you had 2 or more falls in the last 12 monthso 0 Yes Have you had any fall that resulted in injury in the last 12 monthso 0 No FALLS RISK SCREEN History of falling - immediate or within 3 months 25 Yes Secondary diagnosis (Do you have 2 or more medical diagnoseso) 0 No Ambulatory aid None/bed rest/wheelchair/nurse 0 No Crutches/cane/walker 15 Yes Furniture 0 No Intravenous therapy Access/Saline/Heparin Lock 0 No Gait/Transferring Normal/  bed rest/ wheelchair 0 No Weak (short steps with or without shuffle, stooped but able to lift head while walking, may seek 10 Yes support from furniture) Impaired (short steps with shuffle, may have difficulty arising from chair, head down, impaired 0 No balance) Mental Status Oriented to own ability 0 Yes Electronic Signature(s) Signed: 03/09/2020 5:02:37 PM By: Baruch Gouty RN, BSN Entered By: Baruch Gouty on 03/09/2020 10:02:43 -------------------------------------------------------------------------------- Foot Assessment Details Patient Name: Date of Service: [name], [name] 03/09/2020 9:00 A M Medical Record Number: [id number] Patient Account Number: [id number] Date of Birth/Sex: Treating RN: Oct 04, 1945 (74 y.o. [name] Primary Care Oday Ridings: [name] Other Clinician: Referring Verlee Pope: Treating Harriette Tovey/Extender: [name] in Treatment: 0 Foot Assessment Items Site Locations + = Sensation present, - = Sensation absent, C = Callus, U = Ulcer R = Redness, W = Warmth, M = Maceration, PU = Pre-ulcerative lesion F = Fissure, S = Swelling, D = Dryness Assessment Right: Left: Other Deformity: No No Prior Foot Ulcer: No No Prior Amputation: No No Charcot Joint: No No Ambulatory Status:  Ambulatory With Help Assistance Device: Walker Gait: Steady Electronic Signature(s) Signed: 03/09/2020 5:02:37 PM By: Baruch Gouty RN, BSN Entered By: Baruch Gouty on 03/09/2020 10:05:40 -------------------------------------------------------------------------------- Nutrition Risk Screening Details Patient Name: Date of Service: [name], [name] 03/09/2020 9:00 A M Medical Record Number: [id number] Patient Account Number: [id number] Date of Birth/Sex: Treating RN: [birth year] (74 y.o. [name] Primary Care Emileigh Kellett: [name] Other Clinician: Referring Nik Gorrell: Treating Marian Meneely/Extender: [name] in Treatment: 0 Height (in): 74 Weight (lbs): 260 Body Mass Index (BMI): 33.4 Nutrition Risk Screening Items Score Screening NUTRITION RISK SCREEN: I have an illness or condition that made me change the kind and/or amount of food I eat 0 No I eat fewer than two meals per day 0 No I eat few fruits and vegetables, or milk products 0 No I have three or more drinks of beer, liquor or wine almost every day 0 No I have tooth or mouth problems that make it hard for me to eat 2 Yes I don't always have enough money to buy the food I need 0 No I eat alone most of the time 0 No I take three or more different prescribed or over-the-counter drugs a day 1 Yes Without wanting to, I have lost or gained 10 pounds in the last six months 0 No I am not always physically able to shop, cook and/or feed myself 2 Yes Nutrition Protocols Good Risk Protocol Moderate Risk Protocol 0 Provide education on nutrition High Risk Proctocol Risk Level: Moderate Risk Score: 5 Electronic Signature(s) Signed: 03/09/2020 5:02:37 PM By: Baruch Gouty RN, BSN Entered By: Baruch Gouty on 03/09/2020 10:03:23

## 2020-03-15 ENCOUNTER — Telehealth: Payer: Self-pay | Admitting: *Deleted

## 2020-03-15 NOTE — Telephone Encounter (Signed)
Patient called in with caregiver. States he has been getting out of breath when he moves around. Denies sx at rest. Denies fever, cough, CP. Offered appt tomorrow but patient prefers to come Wednesday. Appt given for 03/17/2020 at 1045 with [facility] Team. Caregiver made aware to head directly to ED if sx worsen or develops fever, cough, CP. States understanding. [name], BSN, RN-BC

## 2020-03-15 NOTE — Telephone Encounter (Signed)
OK, great, thanks for letting me know

## 2020-03-15 NOTE — Progress Notes (Signed)
[name] ([id number]) Visit Report for 03/09/2020 Allergy List Details Patient Name: Date of Service: [name] 03/09/2020 9:00 A M Medical Record Number: [id number] Patient Account Number: [id number] Date of Birth/Sex: Treating RN: [birth year] (74 y.o. Male) [name] Primary Care [name]: [name] Other Clinician: Referring [name]: Treating [name]/Extender: [name] in Treatment: 0 Allergies Active Allergies Feraheme Reaction: unknown amlodipine Allergy Notes Electronic Signature(s) Signed: 03/09/2020 5:02:37 PM By: Shawn Gouty RN, BSN Entered By: [name] on 03/09/2020 09:40:11 -------------------------------------------------------------------------------- Arrival Information Details Patient Name: Date of Service: [name], Shawn BERT D. 03/09/2020 9:00 A M Medical Record Number: [id number] Patient Account Number: [id number] Date of Birth/Sex: Treating RN: 1946/06/05 (74 y.o. Male) [name] Primary Care Jeno Calleros: [name] Other Clinician: Referring [name]: Treating [name]/Extender: [name] in Treatment: 0 Visit Information Patient Arrived: [name] Time: 09:14 Accompanied By: niece Transfer Assistance: None Patient Identification Verified: Yes Secondary Verification Process Completed: Yes Patient Requires Transmission-Based Precautions: No Patient Has Alerts: No Electronic Signature(s) Signed: 03/09/2020 5:02:37 PM By: Shawn Gouty RN, BSN Entered By: [name] on 03/09/2020 09:21:15 -------------------------------------------------------------------------------- Clinic Level of Care Assessment Details Patient Name: Date of Service: [name], Shawn BERT D. 03/09/2020 9:00 A M Medical Record Number: [id number] Patient Account Number: [id number] Date of Birth/Sex: Treating RN: 02/14/1946 (74 y.o. Male) [name] Primary Care Starling Jessie: [name] Other Clinician: Referring [name]: Treating [name]/Extender: [name] in Treatment: 0 Clinic Level of Care Assessment Items TOOL 1 Quantity Score X- 1 0 Use when EandM and Procedure is performed on INITIAL visit ASSESSMENTS - Nursing Assessment / Reassessment X- 1 20 General Physical Exam (combine w/ comprehensive assessment (listed just below) when performed on new pt. evals) X- 1 25 Comprehensive Assessment (HX, ROS, Risk Assessments, Wounds Hx, etc.) ASSESSMENTS - Wound and Skin Assessment / Reassessment []  - 0 Dermatologic / Skin Assessment (not related to wound area) ASSESSMENTS - Ostomy and/or Continence Assessment and Care []  - 0 Incontinence Assessment and Management []  - 0 Ostomy Care Assessment and Management (repouching, etc.) PROCESS - Coordination of Care X - Simple Patient / Family Education for ongoing care 1 15 []  - 0 Complex (extensive) Patient / Family Education for ongoing care X- 1 10 Staff obtains Programmer, systems, Records, T Results / Process Orders est []  - 0 Staff telephones HHA, Nursing Homes / Clarify orders / etc []  - 0 Routine Transfer to another Facility (non-emergent condition) []  - 0 Routine Hospital Admission (non-emergent condition) X- 1 15 New Admissions / Biomedical engineer / Ordering NPWT Apligraf, etc. , []  - 0 Emergency Hospital Admission (emergent condition) PROCESS - Special Needs []  - 0 Pediatric / Minor Patient Management []  - 0 Isolation Patient Management []  - 0 Hearing / Language / Visual special needs []  - 0 Assessment of Community assistance (transportation, D/C planning, etc.) []  - 0 Additional assistance / Altered mentation []  - 0 Support Surface(s) Assessment (bed, cushion, seat, etc.) INTERVENTIONS - Miscellaneous []  - 0 External ear exam []  - 0 Patient Transfer (multiple staff / Civil Service fast streamer / Similar devices) []  - 0 Simple Staple / Suture removal (25 or less) []  -  0 Complex Staple / Suture removal (26 or more) []  - 0 Hypo/Hyperglycemic Management (do not check if billed separately) X- 1 15 Ankle / Brachial Index (ABI) - do not check if billed separately Has the patient been seen at the hospital within the last three years: Yes Total Score:  100 Level Of Care: New/Established - Level 3 Electronic Signature(s) Signed: 03/15/2020 1:15:48 PM By: Shawn Coria RN Entered By: [name] on 03/09/2020 10:45:20 -------------------------------------------------------------------------------- Encounter Discharge Information Details Patient Name: Date of Service: [name], Shawn BERT D. 03/09/2020 9:00 A M Medical Record Number: [id number] Patient Account Number: [id number] Date of Birth/Sex: Treating RN: [birth year] (74 y.o. Male) [name] Primary Care [name]: [name] Other Clinician: Referring [name]: Treating [name]/Extender: [name] in Treatment: 0 Encounter Discharge Information Items Post Procedure Vitals Discharge Condition: Stable Temperature (F): 98.5 Ambulatory Status: Walker Pulse (bpm): 78 Discharge Destination: Home Respiratory Rate (breaths/min): 18 Transportation: Private Auto Blood Pressure (mmHg): 118/77 Accompanied By: family member Schedule Follow-up Appointment: Yes Clinical Summary of Care: Patient Declined Electronic Signature(s) Signed: 03/11/2020 4:34:39 PM By: [name] Entered By: [name] on 03/09/2020 11:37:05 -------------------------------------------------------------------------------- Lower Extremity Assessment Details Patient Name: Date of Service: [name], Shawn BERT D. 03/09/2020 9:00 A M Medical Record Number: [id number] Patient Account Number: [id number] Date of Birth/Sex: Treating RN: 02-23-1946 (74 y.o. Male) [name] Primary Care [name]: [name] Other Clinician: Referring [name]: Treating [name]/Extender: [name] in Treatment: 0 Edema Assessment Assessed: [name]: No] [Right: No] Edema: [Left: Ye] [Right: s] Calf Left: Right: Point of Measurement: cm From Medial Instep 43 cm cm Ankle Left: Right: Point of Measurement: cm From Medial Instep 26.7 cm cm Vascular Assessment Pulses: Dorsalis Pedis Palpable: [Left:Yes] Blood Pressure: Brachial: [Left:118] Dorsalis Pedis: 138 Ankle: Posterior Tibial: 96 Ankle Brachial Index: [Left:1.17] Electronic Signature(s) Signed: 03/09/2020 5:02:37 PM By: Shawn Gouty RN, BSN Entered By: [name] on 03/09/2020 10:09:48 -------------------------------------------------------------------------------- Multi Wound Chart Details Patient Name: Date of Service: [name], Shawn BERT D. 03/09/2020 9:00 A M Medical Record Number: [id number] Patient Account Number: [id number] Date of Birth/Sex: Treating RN: February 09, 1946 (74 y.o. 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] [N/A:N/A] Primary Etiology: [1:Cataracts, Glaucoma, Anemia, Deep N/A] Comorbid History: [1:Vein Thrombosis, Hypertension, Peripheral Venous Disease, Osteoarthritis 02/03/2019] [N/A:N/A] Date Acquired: [1:0] [N/A:N/A] Weeks of Treatment: [1:Open] [N/A:N/A] Wound Status: [1:2.7x1x0.3] [N/A:N/A] Measurements L x W x D (cm) [1:2.121] [N/A:N/A] A (cm) : rea [1:0.636] [N/A:N/A] Volume (cm) : [1:Full Thickness Without Exposed] [N/A:N/A] Classification: [1:Support Structures Medium] [N/A:N/A] Exudate A mount:  [1:Serosanguineous] [N/A:N/A] Exudate Type: [1:red, brown] [N/A:N/A] Exudate Color: [1:Distinct, outline attached] [N/A:N/A] Wound Margin: [1:Small (1-33%)] [N/A:N/A] Granulation A mount: [1:Pink] [N/A:N/A] Granulation Quality: [1:Large (67-100%)] [N/A:N/A] Necrotic A mount: [1:Fat Layer (Subcutaneous Tissue): Yes N/A] Exposed Structures: [1:Fascia: No Tendon: No Muscle: No Joint: No Bone: No Small (1-33%)] [N/A:N/A] Epithelialization: [1:Debridement - Excisional] [N/A:N/A] Debridement: Pre-procedure Verification/Time Out 10:33 [N/A:N/A] Taken: [1:Lidocaine 5% topical ointment] [N/A:N/A] Pain Control: [1:Subcutaneous, Slough] [N/A:N/A] Tissue Debrided: [1:Skin/Subcutaneous Tissue] [N/A:N/A] Level: [1:2.7] [N/A:N/A] Debridement A (sq cm): [1:rea Curette] [N/A:N/A] Instrument: [1:Moderate] [N/A:N/A] Bleeding: [1:Pressure] [N/A:N/A] Hemostasis A chieved: [1:0] [N/A:N/A] Procedural Pain: [1:0] [N/A:N/A] Post Procedural Pain: [1:Procedure was tolerated well] [N/A:N/A] Debridement Treatment Response: [1:2.7x1x0.3] [N/A:N/A] Post Debridement Measurements L x W x D (cm) [1:0.636] [N/A:N/A] Post Debridement Volume: (cm) [1:Debridement] [N/A:N/A] Treatment Notes Electronic Signature(s) Signed: 03/09/2020 5:12:45 PM By: Linton Ham MD Signed: 03/15/2020 1:15:48 PM By: Shawn Coria RN Entered By: Linton Ham on 03/09/2020  10:44:29 -------------------------------------------------------------------------------- Multi-Disciplinary Care Plan Details Patient Name: Date of Service: [name] 03/09/2020 9:00 A M Medical Record Number: [id number] Patient Account Number: [id number] Date of Birth/Sex: Treating RN: [birth year] (74 y.o. Male) [name] Primary Care Priscilla Kirstein: [name] Other Clinician: Referring Sloan Takagi: Treating Dquan Cortopassi/Extender: [name] in Treatment: 0 Active Inactive Wound/Skin Impairment Nursing Diagnoses: Knowledge  deficit related to ulceration/compromised skin integrity Goals: Patient/caregiver will verbalize understanding of skin care regimen Date Initiated: 03/09/2020 Target Resolution Date: 04/08/2020 Goal Status: Active Ulcer/skin breakdown will have a volume reduction of 30% by week 4 Date Initiated: 03/09/2020 Target Resolution Date: 04/08/2020 Goal Status: Active Interventions: Assess patient/caregiver ability to obtain necessary supplies Assess patient/caregiver ability to perform ulcer/skin care regimen upon admission and as needed Assess ulceration(s) every visit Notes: Electronic Signature(s) Signed: 03/15/2020 1:15:48 PM By: Shawn Coria RN Entered By: [name] on 03/09/2020 10:39:09 -------------------------------------------------------------------------------- Pain Assessment Details Patient Name: Date of Service: [name], Shawn BERT D. 03/09/2020 9:00 A M Medical Record Number: [id number] Patient Account Number: [id number] Date of Birth/Sex: Treating RN: October 01, 1945 (74 y.o. Male) [name] Primary Care Letia Guidry: [name] Other Clinician: Referring Megin Consalvo: Treating Reynard Christoffersen/Extender: [name] in Treatment: 0 Active Problems Location of Pain Severity and Description of Pain Patient Has Paino No Site Locations Rate the pain. Rate the pain. Current Pain Level: 0 Pain Management and Medication Current Pain Management: Electronic Signature(s) Signed: 03/09/2020 5:02:37 PM By: Shawn Gouty RN, BSN Entered By: [name] on 03/09/2020 10:15:27 -------------------------------------------------------------------------------- Patient/Caregiver Education Details Patient Name: Date of Service: Posey Boyer D. 9/14/2021andnbsp9:00 A M Medical Record Number: [id number] Patient Account Number: [id number] Date of Birth/Gender: Treating RN: 04/19/1946 (74 y.o. Male) [name] Primary Care Physician: [name] Other  Clinician: Referring Physician: Treating Physician/Extender: [name] in Treatment: 0 Education Assessment Education Provided To: Patient Education Topics Provided Wound/Skin Impairment: Methods: Explain/Verbal Responses: State content correctly Electronic Signature(s) Signed: 03/15/2020 1:15:48 PM By: Shawn Coria RN Entered By: [name] on 03/09/2020 10:39:22 -------------------------------------------------------------------------------- Wound Assessment Details Patient Name: Date of Service: [name], Shawn BERT D. 03/09/2020 9:00 A M Medical Record Number: [id number] Patient Account Number: [id number] Date of Birth/Sex: Treating RN: 02-Apr-1946 (74 y.o. Male) [name] Primary Care Kitiara Hintze: [name] Other Clinician: Referring Johny Pitstick: Treating Nitika Jackowski/Extender: [name] in Treatment: 0 Wound Status Wound Number: 1 Primary Neuropathic Ulcer-Non Diabetic Etiology: Wound Location: Left, Lateral Foot Wound Open Wounding Event: Gradually Appeared Status: Date Acquired: 02/03/2019 Comorbid Cataracts, Glaucoma, Anemia, Deep Vein Thrombosis, Weeks Of Treatment: 0 History: Hypertension, Peripheral Venous Disease, Osteoarthritis Clustered Wound: No Photos Photo Uploaded By: Mikeal [name] on 03/11/2020 09:44:40 Wound Measurements Length: (cm) 2.7 Width: (cm) 1 Depth: (cm) 0.3 Area: (cm) 2.121 Volume: (cm) 0.636 % Reduction in Area: % Reduction in Volume: Epithelialization: Small (1-33%) Tunneling: No Undermining: No Wound Description Classification: Full Thickness Without Exposed Support Structures Wound Margin: Distinct, outline attached Exudate Amount: Medium Exudate Type: Serosanguineous Exudate Color: red, brown Foul Odor After Cleansing: No Slough/Fibrino Yes Wound Bed Granulation Amount: Small (1-33%) Exposed Structure Granulation Quality: Pink Fascia Exposed: No Necrotic Amount: Large  (67-100%) Fat Layer (Subcutaneous Tissue) Exposed: Yes Necrotic Quality: Adherent Slough Tendon Exposed: No Muscle Exposed: No Joint Exposed: No Bone Exposed: No Electronic Signature(s) Signed: 03/09/2020 5:02:37 PM By: Shawn Gouty RN, BSN Entered By: [name] on 03/09/2020 10:12:03 -------------------------------------------------------------------------------- [place] Details Patient Name: Date of Service: [name], Shawn BERT D. 03/09/2020 9:00  A M Medical Record Number: [id number] Patient Account Number: [id number] Date of Birth/Sex: Treating RN: 21-Mar-1946 (74 y.o. Male) [name] Primary Care Arieh Bogue: [name] Other Clinician: Referring Gery Sabedra: Treating Victorina Kable/Extender: [name] in Treatment: 0 Vital Signs Time Taken: 09:21 Temperature (F): 98.5 Height (in): 74 Pulse (bpm): 78 Source: Stated Respiratory Rate (breaths/min): 18 Weight (lbs): 260 Blood Pressure (mmHg): 118/77 Source: Stated Reference Range: 80 - 120 mg / dl Body Mass Index (BMI): 33.4 Electronic Signature(s) Signed: 03/09/2020 5:02:37 PM By: Shawn Gouty RN, BSN Entered By: [name] on 03/09/2020 09:23:47
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
## 2020-03-16 ENCOUNTER — Encounter (HOSPITAL_BASED_OUTPATIENT_CLINIC_OR_DEPARTMENT_OTHER): Payer: Medicare Other | Admitting: Internal Medicine

## 2020-03-16 ENCOUNTER — Other Ambulatory Visit: Payer: Self-pay

## 2020-03-16 DIAGNOSIS — I110 Hypertensive heart disease with heart failure: Secondary | ICD-10-CM | POA: Diagnosis not present

## 2020-03-16 DIAGNOSIS — I872 Venous insufficiency (chronic) (peripheral): Secondary | ICD-10-CM | POA: Diagnosis not present

## 2020-03-16 DIAGNOSIS — L97522 Non-pressure chronic ulcer of other part of left foot with fat layer exposed: Secondary | ICD-10-CM | POA: Diagnosis not present

## 2020-03-16 DIAGNOSIS — T8131XA Disruption of external operation (surgical) wound, not elsewhere classified, initial encounter: Secondary | ICD-10-CM | POA: Diagnosis not present

## 2020-03-16 DIAGNOSIS — T8131XD Disruption of external operation (surgical) wound, not elsewhere classified, subsequent encounter: Secondary | ICD-10-CM | POA: Diagnosis not present

## 2020-03-16 DIAGNOSIS — I509 Heart failure, unspecified: Secondary | ICD-10-CM | POA: Diagnosis not present
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
## 2020-03-16 NOTE — Progress Notes (Signed)
ROBIN, PAFFORD ([id number]) Visit Report for 03/16/2020 Arrival Information Details Patient Name: Date of Service: [name] 03/16/2020 12:30 PM Medical Record Number: [id number] Patient Account Number: [id number] Date of Birth/Sex: Treating RN: 1945-09-06 (74 y.o. [name], [name] Primary Care Mitchael Luckey: [name] Other Clinician: Referring Carroll Ranney: Treating Dreon Pineda/Extender: [name] in Treatment: 1 Visit Information History Since Last Visit Added or deleted any medications: No Patient Arrived: [name] Any new allergies or adverse reactions: No Arrival Time: 12:30 Had a fall or experienced change in No Accompanied By: niece activities of daily living that may affect Transfer Assistance: None risk of falls: Patient Identification Verified: Yes Signs or symptoms of abuse/neglect since last visito No Secondary Verification Process Completed: Yes Hospitalized since last visit: No Patient Requires Transmission-Based Precautions: No Implantable device outside of the clinic excluding No Patient Has Alerts: No cellular tissue based products placed in the center since last visit: Has Dressing in Place as Prescribed: Yes Has Compression in Place as Prescribed: Yes Pain Present Now: No Electronic Signature(s) Signed: 03/16/2020 4:05:29 PM By: Baruch Gouty RN, BSN Entered By: Baruch Gouty on 03/16/2020 12:44:53 -------------------------------------------------------------------------------- Compression Therapy Details Patient Name: Date of Service: [name], [name] 03/16/2020 12:30 PM Medical Record Number: [id number] Patient Account Number: [id number] Date of Birth/Sex: Treating RN: [birth year] (74 y.o. [name] Primary Care Beaumont Austad: [name] Other Clinician: Referring [place] Current: Treating Erasmo Vertz/Extender: [name] in Treatment: 1 Compression Therapy Performed for Wound Assessment: Wound  #1 Left,Lateral Foot Performed By: Clinician Carlene Coria, RN Compression Type: Three Layer Post Procedure Diagnosis Same as Pre-procedure Electronic Signature(s) Signed: 03/16/2020 4:24:48 PM By: Carlene Coria RN Entered By: Carlene Coria on 03/16/2020 13:03:01 -------------------------------------------------------------------------------- Encounter Discharge Information Details Patient Name: Date of Service: [name], [name] 03/16/2020 12:30 PM Medical Record Number: [id number] Patient Account Number: [id number] Date of Birth/Sex: Treating RN: 10-16-1945 (74 y.o. [name] Primary Care Shawana Knoch: [name] Other Clinician: Referring Jaydah Stahle: Treating Ruger Saxer/Extender: [name] in Treatment: 1 Encounter Discharge Information Items Post Procedure Vitals Discharge Condition: Stable Temperature (F): 99.2 Ambulatory Status: Walker Pulse (bpm): 85 Discharge Destination: Home Respiratory Rate (breaths/min): 18 Transportation: Other Blood Pressure (mmHg): 109/66 Accompanied By: daughter Schedule Follow-up Appointment: Yes Clinical Summary of Care: Patient Declined Electronic Signature(s) Signed: 03/16/2020 4:07:33 PM By: Kela Millin Entered By: Kela Millin on 03/16/2020 13:16:45 -------------------------------------------------------------------------------- Lower Extremity Assessment Details Patient Name: Date of Service: [name], [name] 03/16/2020 12:30 PM Medical Record Number: [id number] Patient Account Number: [id number] Date of Birth/Sex: Treating RN: 13-Jun-1946 (74 y.o. [name] Primary Care Margues Filippini: [name] Other Clinician: Referring Cicero Noy: Treating Danie Diehl/Extender: [name] in Treatment: 1 Edema Assessment Assessed: [name]: No] [name]: No] Edema: [Left: Ye] [Right: s] Calf Left: Right: Point of Measurement: cm From Medial Instep 44 cm cm Ankle Left:  Right: Point of Measurement: cm From Medial Instep 27 cm cm Vascular Assessment Pulses: Dorsalis Pedis Palpable: [Left:Yes] Electronic Signature(s) Signed: 03/16/2020 4:05:29 PM By: Baruch Gouty RN, BSN Entered By: Baruch Gouty on 03/16/2020 12:46:04 -------------------------------------------------------------------------------- [place] Details Patient Name: Date of Service: [name], Tobey Bride D. 03/16/2020 12:30 PM Medical Record Number: [id number] Patient Account Number: [id number] Date of Birth/Sex: Treating RN: 05-21-1946 (74 y.o. [name] Primary Care Garrie Woodin: [name] Other Clinician: Referring Cristofer Yaffe: Treating Ryliee Figge/Extender: [name] in Treatment: 1 Active Inactive Wound/Skin Impairment Nursing Diagnoses: Knowledge deficit related to  ulceration/compromised skin integrity Goals: Patient/caregiver will verbalize understanding of skin care regimen Date Initiated: 03/09/2020 Target Resolution Date: 04/08/2020 Goal Status: Active Ulcer/skin breakdown will have a volume reduction of 30% by week 4 Date Initiated: 03/09/2020 Target Resolution Date: 04/08/2020 Goal Status: Active Interventions: Assess patient/caregiver ability to obtain necessary supplies Assess patient/caregiver ability to perform ulcer/skin care regimen upon admission and as needed Assess ulceration(s) every visit Notes: Electronic Signature(s) Signed: 03/16/2020 4:24:48 PM By: Carlene Coria RN Entered By: Carlene Coria on 03/16/2020 12:50:43 -------------------------------------------------------------------------------- Pain Assessment Details Patient Name: Date of Service: [name], [name] 03/16/2020 12:30 PM Medical Record Number: [id number] Patient Account Number: [id number] Date of Birth/Sex: Treating RN: May 03, 1946 (74 y.o. [name] Primary Care Shelley Pooley: [name] Other Clinician: Referring  Darianny Momon: Treating Jerita Wimbush/Extender: [name] in Treatment: 1 Active Problems Location of Pain Severity and Description of Pain Patient Has Paino No Site Locations Rate the pain. Rate the pain. Current Pain Level: 0 Pain Management and Medication Current Pain Management: Electronic Signature(s) Signed: 03/16/2020 4:05:29 PM By: Baruch Gouty RN, BSN Entered By: Baruch Gouty on 03/16/2020 12:45:29 -------------------------------------------------------------------------------- Patient/Caregiver Education Details Patient Name: Date of Service: Posey Boyer D. 9/21/2021andnbsp12:30 PM Medical Record Number: [id number] Patient Account Number: [id number] Date of Birth/Gender: Treating RN: 1946/04/05 (74 y.o. [name] Carlene Coria Primary Care Physician: [name] Other Clinician: Referring Physician: Treating Physician/Extender: [name] in Treatment: 1 Education Assessment Education Provided To: Patient Education Topics Provided Wound/Skin Impairment: Methods: Explain/Verbal Responses: State content correctly Electronic Signature(s) Signed: 03/16/2020 4:24:48 PM By: Carlene Coria RN Entered By: Carlene Coria on 03/16/2020 12:51:13 -------------------------------------------------------------------------------- Wound Assessment Details Patient Name: Date of Service: [name], [name] 03/16/2020 12:30 PM Medical Record Number: [id number] Patient Account Number: [id number] Date of Birth/Sex: Treating RN: 11-May-1946 (74 y.o. [name] Primary Care Raghad Lorenz: [name] Other Clinician: Referring Shalicia Craghead: Treating Eeshan Verbrugge/Extender: [name] in Treatment: 1 Wound Status Wound Number: 1 Primary Dehisced Wound Etiology: Wound Location: Left, Lateral Foot Wound Open Wounding Event: Gradually Appeared Status: Date Acquired: 02/03/2019 Comorbid Cataracts, Glaucoma, Anemia,  Deep Vein Thrombosis, Weeks Of Treatment: 1 History: Hypertension, Peripheral Venous Disease, Osteoarthritis Clustered Wound: No Wound Measurements Length: (cm) 2.2 Width: (cm) 1 Depth: (cm) 0.3 Area: (cm) 1.728 Volume: (cm) 0.518 % Reduction in Area: 18.5% % Reduction in Volume: 18.6% Epithelialization: Small (1-33%) Tunneling: No Undermining: No Wound Description Classification: Full Thickness Without Exposed Support Structures Wound Margin: Distinct, outline attached Exudate Amount: Medium Exudate Type: Serosanguineous Exudate Color: red, brown Foul Odor After Cleansing: No Slough/Fibrino Yes Wound Bed Granulation Amount: Medium (34-66%) Exposed Structure Granulation Quality: Red Fascia Exposed: No Necrotic Amount: Medium (34-66%) Fat Layer (Subcutaneous Tissue) Exposed: Yes Necrotic Quality: Adherent Slough Tendon Exposed: No Muscle Exposed: No Joint Exposed: No Bone Exposed: No Treatment Notes Wound #1 (Left, Lateral Foot) 1. Cleanse With Wound Cleanser Soap and water 2. Periwound Care Moisturizing lotion 3. Primary Dressing Applied Collegen AG 4. Secondary Dressing Dry Gauze 6. Support Layer Applied 3 layer compression wrap Notes netting Electronic Signature(s) Signed: 03/16/2020 4:05:29 PM By: Baruch Gouty RN, BSN Entered By: Baruch Gouty on 03/16/2020 12:48:03 -------------------------------------------------------------------------------- [place] Details Patient Name: Date of Service: [name], [name] 03/16/2020 12:30 PM Medical Record Number: [id number] Patient Account Number: [id number] Date of Birth/Sex: Treating RN: 1945/08/05 (74 y.o. [name] Primary Care Masa Lubin: [name] Other Clinician: Referring Tieasha Larsen: Treating Arvis Miguez/Extender: [name] in Treatment: 1  Vital Signs Time Taken: 12:44 Temperature (F): 99.2 Height (in): 74 Pulse (bpm): 85 Source: Stated Respiratory Rate  (breaths/min): 18 Weight (lbs): 260 Blood Pressure (mmHg): 109/66 Source: Stated Reference Range: 80 - 120 mg / dl Body Mass Index (BMI): 33.4 Electronic Signature(s) Signed: 03/16/2020 4:05:29 PM By: Baruch Gouty RN, BSN Entered By: Baruch Gouty on 03/16/2020 12:45:21

## 2020-03-17 ENCOUNTER — Encounter: Payer: Self-pay | Admitting: Internal Medicine

## 2020-03-17 ENCOUNTER — Other Ambulatory Visit: Payer: Self-pay

## 2020-03-17 ENCOUNTER — Ambulatory Visit (HOSPITAL_COMMUNITY)
Admission: RE | Admit: 2020-03-17 | Discharge: 2020-03-17 | Disposition: A | Discharge status: Home / Self Care | Payer: Medicare Other | Source: Ambulatory Visit | Attending: Internal Medicine | Admitting: Internal Medicine

## 2020-03-17 ENCOUNTER — Ambulatory Visit (INDEPENDENT_AMBULATORY_CARE_PROVIDER_SITE_OTHER): Payer: Medicare Other | Admitting: Internal Medicine

## 2020-03-17 VITALS — BP 131/64 | HR 74 | Temp 98.1°F | Ht 74.0 in | Wt 275.5 lb

## 2020-03-17 DIAGNOSIS — I517 Cardiomegaly: Secondary | ICD-10-CM | POA: Diagnosis not present

## 2020-03-17 DIAGNOSIS — I5032 Chronic diastolic (congestive) heart failure: Secondary | ICD-10-CM

## 2020-03-17 LAB — BASIC METABOLIC PANEL
Anion gap: 13 (ref 5–15)
BUN: 21 mg/dL (ref 8–23)
CO2: 26 mmol/L (ref 22–32)
Calcium: 9.9 mg/dL (ref 8.9–10.3)
Chloride: 102 mmol/L (ref 98–111)
Creatinine, Ser: 1.6 mg/dL — ABNORMAL HIGH (ref 0.61–1.24)
GFR calc Af Amer: 48 mL/min — ABNORMAL LOW (ref >60)
GFR calc non Af Amer: 41 mL/min — ABNORMAL LOW (ref >60)
Glucose, Bld: 80 mg/dL (ref 70–99)
Potassium: 3.9 mmol/L (ref 3.5–5.1)
Sodium: 141 mmol/L (ref 135–145)

## 2020-03-17 LAB — BRAIN NATRIURETIC PEPTIDE: B Natriuretic Peptide: 914.9 pg/mL — ABNORMAL HIGH (ref 0.0–100.0)

## 2020-03-17 MED ORDER — FUROSEMIDE 20 MG PO TABS
20.0000 mg | ORAL_TABLET | Freq: Every day | ORAL | 0 refills | Status: DC
Start: 2020-03-17 — End: 2020-05-18

## 2020-03-17 NOTE — Progress Notes (Signed)
CC: Shortness of Breath  HPI:  [name] is a 74 y.o. male, with a PMH noted below, presents to the clinic for shortness of breath. To see the management of his acute and chronic conditions, please refer to the A&P note under the encounters tab.   Past Medical History:  Diagnosis Date  . 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  Excellent symptomatic response to LES Botox injections.   . Anemia 08/20/2018  . AVM (arteriovenous malformation) of colon 08/26/2013   Cecum X 3, without bleeding on colonoscopy February 2015   . B12 deficiency 10/01/2011   Discovered to 2 progressive neurologic pain and dysfunction.  Diagnosis established March 2013.  Requires the following treatment: B12 IM monthly until level normalizes. After that, Shawn supplementation.   . Cataract   . Chronic venous insufficiency 11/28/2013  . Constipation due to opioid therapy 03/11/2015  . Deep venous thrombosis ([place]) 05/31/2018   Right lower extremity, unprovoked  . Diverticulosis 08/26/2013  . Essential hypertension 06/15/2006  . Family history of malignant neoplasm of gastrointestinal tract 06/30/2013   Father with colon cancer age 60   . Gastroesophageal reflux disease 02/04/2007  . GI bleeding 08/21/2018  . Internal hemorrhoids 08/26/2013  . Left posterior subcapsular cataract 10/03/2013   s/p yag laser capsulotomy 10/03/2013   . 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  Complicated by chronic low back pain and neuropathy.  Nerve conduction study (10/19/11): Diffusely low motor amplitudes, with primarily involvement of the peroneal and posterior tibial nerves. No evidence of generalized peripheral neuropathy. Findings could be c/w bil multilevel lumbosacral radiculopathies or a  primary motor neuropathy.   . Mild aortic valve stenosis 10/09/2006   Echo (12/09/2009): Valve area: 2.03 cm2   . Neuromuscular disorder ([place])   . Neuropathy   . Obesity, Class I, BMI 30-34.9 04/05/2012  . Onychomycosis of toenail 06/05/2014  . Osteoarthritis 11/25/2009   Right knee (medial compartment), right hip, left shoulder   . Peripheral neuropathy   . 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 Trigger little finger of right hand 03/11/2015   Review of Systems:   Review of Systems  Constitutional: Negative for chills, diaphoresis, fever, malaise/fatigue and weight loss.  Respiratory: Positive for cough and shortness of breath. Negative for hemoptysis, sputum production and wheezing.   Cardiovascular: Positive for leg swelling. Negative for chest pain, palpitations, orthopnea and claudication.  Gastrointestinal: Negative for abdominal pain, constipation, diarrhea, nausea and vomiting.  Neurological: Negative for headaches.    Physical Exam:  Vitals:   03/17/20 1011  BP: 131/64  Pulse: 74  Temp: 98.1 F (36.7 C)  TempSrc: Shawn  SpO2: (!) 87%  Weight: 275 lb 8 oz (125 kg)  Height: 6\' 2"  (1.88 m)   Physical Exam Constitutional:      General: He is not in acute distress.    Appearance: Normal appearance. He is obese. He is not ill-appearing, toxic-appearing or diaphoretic.  HENT:     Head: Normocephalic and atraumatic.  Cardiovascular:     Rate and Rhythm: Normal rate and regular rhythm.     Pulses: Normal pulses.     Heart sounds: Normal heart sounds. No murmur heard.  No friction rub. No gallop.   Pulmonary:  Effort: Pulmonary effort is normal. No respiratory distress.     Breath sounds: Normal breath sounds. No wheezing, rhonchi or rales.  Abdominal:     General: Bowel sounds are normal.     Tenderness: There is no abdominal tenderness. There is no guarding.  Musculoskeletal:     Right lower leg: Edema present.       Left lower leg: Edema present.     Comments: 1+ Pitting edema in the legs bilaterally, L leg well bandaged and in boot.   Skin:    General: Skin is warm.     Findings: No bruising or rash.  Neurological:     Mental Status: He is alert and oriented to person, place, and time.     Assessment & Plan:   See Encounters Tab for problem based charting.  Patient discussed with Dr. Dareen Piano

## 2020-03-17 NOTE — Patient Instructions (Addendum)
[name],  It was a pleasure meeting you today. Today we discussed your shortness of breath. We will get imaging of your chest. Your symptoms may be due to your heart or lungs. We will take your blood work today to assess your heart and kidneys. We will also give you a small increase in your lasix. Please take the medication daily and we will see you in 1-2 weeks. Have a good day!  Sincerely,  Maudie Mercury, MD

## 2020-03-17 NOTE — Assessment & Plan Note (Addendum)
Patient, with a PMH of chronic heart failure, presents to the office for shortness of breath for 1 week in duration. Patient states that he lives on the 2nd floor of his community housing is usually able to make it down to the first floor via stairs normally. But over the last week he is short of breath just going to the elevator (approximately 30 feet from his door). Patient does endorse a cough and "itchiness" but does not endorse sputum production. His SHOB occurs on exertion, and dissipates at rest. His [facility] is note worsened at night, he has not had to increase his pillow or sleep sitting upright. He does endorse chronic lower swelling edema. He states that his pants are tight, but that this is chronic in nature. He denies fevers, nausea, vomiting, chest pain, or abdominal pain.   Patient presents to the clinic with [facility] 1 week in duration. His pulmonary examination is unremarkable. He does have 1+ pitting edema bilaterally 2/2 to venous insufficiency He denies fevers, wheezing, chest pain, abdominal pain, n/v. He does endorse waist line expansion, but states that this has been chronic. His weight is up today. He did desaturate while getting up from the wheelchair to the scale to 87% and went to 98% after sitting back down. He has been fully vaccinated against covid, he has no fevers or sick contacts, likely he may have volume overload 2/2 to his heart failure. Will check imaging, BNP, BMP, start on an increased dose of lasix and close follow up.  - Chest xray  - BMP - BNP  - Additional Lasix 20 in addition to his current regimen.

## 2020-03-18 NOTE — Addendum Note (Signed)
Addended by: Aldine Contes on: 03/18/2020 09:10 AM   Modules accepted: Level of Service

## 2020-03-18 NOTE — Progress Notes (Addendum)
Internal Medicine Clinic Attending  Case discussed with Dr. [name]  At the time of the visit.  We reviewed the resident's history and exam and pertinent patient test results.  I agree with the assessment, diagnosis, and plan of care documented in the resident's note.  Patient noted to have a worsening creatinine up to 1.6 and an elevated BNP but his chest x-ray was clear.  I am concerned that patient may have a component of right-sided heart failure.  Would repeat 2D echo as well as have patient follow-up with [place] early next week.  He will need a repeat BMP at that appointment as well.  Plan was discussed with Dr. [name].

## 2020-03-23 ENCOUNTER — Encounter (HOSPITAL_BASED_OUTPATIENT_CLINIC_OR_DEPARTMENT_OTHER): Payer: Medicare Other | Admitting: Internal Medicine

## 2020-03-23 ENCOUNTER — Other Ambulatory Visit: Payer: Self-pay

## 2020-03-23 DIAGNOSIS — I509 Heart failure, unspecified: Secondary | ICD-10-CM | POA: Diagnosis not present

## 2020-03-23 DIAGNOSIS — I872 Venous insufficiency (chronic) (peripheral): Secondary | ICD-10-CM | POA: Diagnosis not present

## 2020-03-23 DIAGNOSIS — I110 Hypertensive heart disease with heart failure: Secondary | ICD-10-CM | POA: Diagnosis not present

## 2020-03-23 DIAGNOSIS — T8131XA Disruption of external operation (surgical) wound, not elsewhere classified, initial encounter: Secondary | ICD-10-CM | POA: Diagnosis not present

## 2020-03-23 DIAGNOSIS — L97522 Non-pressure chronic ulcer of other part of left foot with fat layer exposed: Secondary | ICD-10-CM | POA: Diagnosis not present

## 2020-03-23 DIAGNOSIS — T8131XD Disruption of external operation (surgical) wound, not elsewhere classified, subsequent encounter: Secondary | ICD-10-CM | POA: Diagnosis not present
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
## 2020-03-24 ENCOUNTER — Encounter: Payer: Self-pay | Admitting: Internal Medicine

## 2020-03-24 ENCOUNTER — Ambulatory Visit (INDEPENDENT_AMBULATORY_CARE_PROVIDER_SITE_OTHER): Payer: Medicare Other | Admitting: Internal Medicine

## 2020-03-24 VITALS — BP 116/59 | HR 69 | Temp 98.4°F | Wt 267.9 lb

## 2020-03-24 DIAGNOSIS — M1711 Unilateral primary osteoarthritis, right knee: Secondary | ICD-10-CM

## 2020-03-24 DIAGNOSIS — I5032 Chronic diastolic (congestive) heart failure: Secondary | ICD-10-CM | POA: Diagnosis not present

## 2020-03-24 MED ORDER — OXYCODONE-ACETAMINOPHEN 10-325 MG PO TABS: 1.0000 | ORAL_TABLET | Freq: Three times a day (TID) | ORAL | 0 refills | Status: DC | PRN

## 2020-03-24 NOTE — Assessment & Plan Note (Signed)
Patient presents today for his heart failure exacerbation. Patient states that he completed his last dose of increased lasix this morning. His shortness of breath has been alleviated and he is able to walk his normal pace and distance without SHOB.   We discussed limiting his fluid intake as he did state he was drinking more fluids since he was having increased urination with the lasix. Discussed how increasing fluid intake could lead to another exacerbation episode. Patient voiced understanding.  - Continue 40 mg Lasix daily  - Education on fluid restriction and medication compliance.

## 2020-03-24 NOTE — Assessment & Plan Note (Signed)
Patient states that he is out of his chronic pain medication of oxycodone. He states that usually he takes his medication once in the morning and sometimes in the evening for his chronic multijoint arthritis pain. His usual regimen does not completely take the pain away, but is well managed. Since running out of his medication he has had difficulty with ambulation secondary to pain. Upon review of his medication Hx, he has been consistently refilling his medication. Will refill at this time.  - Refill Percocet 10-325 mg Q8H PRN

## 2020-03-24 NOTE — Progress Notes (Signed)
CC: Shortness of Breath Follow UP  HPI:  [name] is a 74 y.o. male, with a PMH noted below, who presents to the clinic for his shortness of breath. To see the management of their acute and chronic conditions, please see the A&P note under the Encounters tab.   Past Medical History:  Diagnosis Date  . 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  Excellent symptomatic response to LES Botox injections.   . Anemia 08/20/2018  . AVM (arteriovenous malformation) of colon 08/26/2013   Cecum X 3, without bleeding on colonoscopy February 2015   . B12 deficiency 10/01/2011   Discovered to 2 progressive neurologic pain and dysfunction.  Diagnosis established March 2013.  Requires the following treatment: B12 IM monthly until level normalizes. After that, oral supplementation.   . Cataract   . Chronic venous insufficiency 11/28/2013  . Constipation due to opioid therapy 03/11/2015  . Deep venous thrombosis ([place]) 05/31/2018   Right lower extremity, unprovoked  . Diverticulosis 08/26/2013  . Essential hypertension 06/15/2006  . Family history of malignant neoplasm of gastrointestinal tract 06/30/2013   Father with colon cancer age 38   . Gastroesophageal reflux disease 02/04/2007  . GI bleeding 08/21/2018  . Internal hemorrhoids 08/26/2013  . Left posterior subcapsular cataract 10/03/2013   s/p yag laser capsulotomy 10/03/2013   . 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  Complicated by chronic low back pain and neuropathy.  Nerve conduction study (10/19/11): Diffusely low motor amplitudes, with primarily involvement of the peroneal and posterior tibial nerves. No evidence of generalized peripheral neuropathy. Findings could be c/w bil multilevel lumbosacral  radiculopathies or a primary motor neuropathy.   . Mild aortic valve stenosis 10/09/2006   Echo (12/09/2009): Valve area: 2.03 cm2   . Neuromuscular disorder ([place])   . Neuropathy   . Obesity, Class I, BMI 30-34.9 04/05/2012  . Onychomycosis of toenail 06/05/2014  . Osteoarthritis 11/25/2009   Right knee (medial compartment), right hip, left shoulder   . Peripheral neuropathy   . 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 Trigger little finger of right hand 03/11/2015   Review of Systems:   Review of Systems  Constitutional: Negative for chills, fever, malaise/fatigue and weight loss.  Respiratory: Negative for cough, sputum production, shortness of breath and wheezing.   Cardiovascular: Negative for chest pain, palpitations, orthopnea, claudication and leg swelling.  Gastrointestinal: Negative for abdominal pain, constipation, diarrhea, nausea and vomiting.    Physical Exam:  Vitals:   03/24/20 0955  BP: (!) 116/59  Pulse: 69  Temp: 98.4 F (36.9 C)  SpO2: 100%  Weight: 267 lb 14.4 oz (121.5 kg)   Physical Exam Vitals reviewed.  Constitutional:      General: He is not in acute distress.    Appearance: Normal appearance. He is not ill-appearing or toxic-appearing.  HENT:     Head: Normocephalic and atraumatic.  Eyes:     General:        Right eye: No discharge.        Left eye: No discharge.     Conjunctiva/sclera: Conjunctivae normal.  Cardiovascular:     Rate and Rhythm: Normal rate and regular rhythm.     Pulses: Normal pulses.     Heart  sounds: Normal heart sounds. No murmur heard.  No friction rub. No gallop.   Pulmonary:     Effort: Pulmonary effort is normal.     Breath sounds: Normal breath sounds. No wheezing, rhonchi or rales.  Abdominal:     General: Bowel sounds are normal.     Palpations: Abdomen is soft.     Tenderness: There is no abdominal tenderness. There is no guarding.  Musculoskeletal:         General: No swelling.     Right lower leg: No edema.     Left lower leg: No edema.  Neurological:     Mental Status: He is alert and oriented to person, place, and time.     Assessment & Plan:   See Encounters Tab for problem based charting.  Patient discussed with Dr. Daryll Drown

## 2020-03-24 NOTE — Patient Instructions (Signed)
Mr. Enriques,  It was a pleasure seeing you again today. Today we discussed your knee pain and shortness of breath. I am glad you are able to breath better today. You can go back to your regular Lasix dose, and continue to restrict your fluid intake. We will also refill your pain medications today for your knee pain.  We will see you in 3 months time.  Sincerely,  Maudie Mercury, MD

## 2020-03-25 LAB — BMP8+ANION GAP
Anion Gap: 16 mmol/L (ref 10.0–18.0)
BUN/Creatinine Ratio: 13 (ref 10–24)
BUN: 16 mg/dL (ref 8–27)
CO2: 27 mmol/L (ref 20–29)
Calcium: 9.7 mg/dL (ref 8.6–10.2)
Chloride: 98 mmol/L (ref 96–106)
Creatinine, Ser: 1.21 mg/dL (ref 0.76–1.27)
GFR calc Af Amer: 67 mL/min/{1.73_m2} (ref >59)
GFR calc non Af Amer: 58 mL/min/{1.73_m2} — ABNORMAL LOW (ref >59)
Glucose: 124 mg/dL — ABNORMAL HIGH (ref 65–99)
Potassium: 3.5 mmol/L (ref 3.5–5.2)
Sodium: 141 mmol/L (ref 134–144)

## 2020-03-30 ENCOUNTER — Encounter (HOSPITAL_BASED_OUTPATIENT_CLINIC_OR_DEPARTMENT_OTHER): Payer: Medicare Other | Admitting: Internal Medicine

## 2020-04-01 ENCOUNTER — Encounter (HOSPITAL_BASED_OUTPATIENT_CLINIC_OR_DEPARTMENT_OTHER): Payer: Medicare Other | Attending: Internal Medicine | Admitting: Internal Medicine

## 2020-04-01 ENCOUNTER — Other Ambulatory Visit: Payer: Self-pay

## 2020-04-01 DIAGNOSIS — X58XXXA Exposure to other specified factors, initial encounter: Secondary | ICD-10-CM | POA: Diagnosis not present

## 2020-04-01 DIAGNOSIS — Y9389 Activity, other specified: Secondary | ICD-10-CM | POA: Diagnosis not present

## 2020-04-01 DIAGNOSIS — Z86718 Personal history of other venous thrombosis and embolism: Secondary | ICD-10-CM | POA: Diagnosis not present

## 2020-04-01 DIAGNOSIS — I872 Venous insufficiency (chronic) (peripheral): Secondary | ICD-10-CM | POA: Insufficient documentation

## 2020-04-01 DIAGNOSIS — T8131XD Disruption of external operation (surgical) wound, not elsewhere classified, subsequent encounter: Secondary | ICD-10-CM | POA: Insufficient documentation

## 2020-04-01 DIAGNOSIS — L97522 Non-pressure chronic ulcer of other part of left foot with fat layer exposed: Secondary | ICD-10-CM | POA: Insufficient documentation

## 2020-04-01 DIAGNOSIS — L97811 Non-pressure chronic ulcer of other part of right lower leg limited to breakdown of skin: Secondary | ICD-10-CM | POA: Diagnosis not present

## 2020-04-01 DIAGNOSIS — I110 Hypertensive heart disease with heart failure: Secondary | ICD-10-CM | POA: Diagnosis not present

## 2020-04-01 DIAGNOSIS — I5032 Chronic diastolic (congestive) heart failure: Secondary | ICD-10-CM | POA: Insufficient documentation

## 2020-04-01 DIAGNOSIS — D649 Anemia, unspecified: Secondary | ICD-10-CM | POA: Insufficient documentation

## 2020-04-01 DIAGNOSIS — H409 Unspecified glaucoma: Secondary | ICD-10-CM | POA: Diagnosis not present

## 2020-04-01 NOTE — Progress Notes (Signed)
Internal Medicine Clinic Attending ? ?Case discussed with Dr. Winters  At the time of the visit.  We reviewed the resident?s history and exam and pertinent patient test results.  I agree with the assessment, diagnosis, and plan of care documented in the resident?s note.  ?

## 2020-04-01 NOTE — Progress Notes (Signed)
KAISYN, MILLEA ([id number]) Visit Report for 04/01/2020 Arrival Information Details Patient Name: Date of Service: Jarvis Newcomer 04/01/2020 3:15 PM Medical Record Number: [id number] Patient Account Number: [id number] Date of Birth/Sex: Treating RN: December 23, 1945 (74 y.o. [name] Primary Care Hallie Ishida: [name] Other Clinician: Referring Trimaine Maser: Treating Habiba Treloar/Extender: [name] in Treatment: 3 Visit Information History Since Last Visit Added or deleted any medications: No Patient Arrived: [name] Any new allergies or adverse reactions: No Arrival Time: 15:36 Had a fall or experienced change in Yes Accompanied By: self activities of daily living that may affect Transfer Assistance: None risk of falls: Patient Identification Verified: Yes Signs or symptoms of abuse/neglect since last visito No Secondary Verification Process Completed: Yes Hospitalized since last visit: No Patient Requires Transmission-Based Precautions: No Implantable device outside of the clinic excluding No Patient Has Alerts: No cellular tissue based products placed in the center since last visit: Pain Present Now: Yes Electronic Signature(s) Signed: 04/01/2020 6:05:34 PM By: Baruch Gouty RN, BSN Entered By: Baruch Gouty on 04/01/2020 15:39:26 -------------------------------------------------------------------------------- Compression Therapy Details Patient Name: Date of Service: [name], [name] 04/01/2020 3:15 PM Medical Record Number: [id number] Patient Account Number: [id number] Date of Birth/Sex: Treating RN: [birth year] (74 y.o. [name] Primary Care Velita Quirk: [name] Other Clinician: Referring Harbor Paster: Treating Amillion Scobee/Extender: [name] in Treatment: 3 Compression Therapy Performed for Wound Assessment: Wound #1 Left,Lateral Foot Performed By: Clinician Baruch Gouty, RN Compression Type:  Three Hydrologist) Signed: 04/01/2020 6:05:34 PM By: Baruch Gouty RN, BSN Entered By: Baruch Gouty on 04/01/2020 15:45:08 -------------------------------------------------------------------------------- Encounter Discharge Information Details Patient Name: Date of Service: [name], [name] 04/01/2020 3:15 PM Medical Record Number: [id number] Patient Account Number: [id number] Date of Birth/Sex: Treating RN: February 19, 1946 (74 y.o. [name] Primary Care Roxanna Mcever: [name] Other Clinician: Referring Aryana Wonnacott: Treating Emric Kowalewski/Extender: [name] in Treatment: 3 Encounter Discharge Information Items Discharge Condition: Stable Ambulatory Status: Walker Discharge Destination: Home Transportation: Other Accompanied By: self Schedule Follow-up Appointment: Yes Clinical Summary of Care: Patient Declined Notes SCAT Electronic Signature(s) Signed: 04/01/2020 6:05:34 PM By: Baruch Gouty RN, BSN Entered By: Baruch Gouty on 04/01/2020 15:46:30 -------------------------------------------------------------------------------- Patient/Caregiver Education Details Patient Name: Date of Service: Jarvis Newcomer 10/7/2021andnbsp3:15 PM Medical Record Number: [id number] Patient Account Number: [id number] Date of Birth/Gender: Treating RN: 1945/09/20 (74 y.o. [name] Primary Care Physician: [name] Other Clinician: Referring Physician: Treating Physician/Extender: [name] in Treatment: 3 Education Assessment Education Provided To: Patient Education Topics Provided Venous: Methods: Explain/Verbal Responses: Reinforcements needed, State content correctly Wound/Skin Impairment: Methods: Explain/Verbal Responses: Reinforcements needed, State content correctly Electronic Signature(s) Signed: 04/01/2020 6:05:34 PM By: Baruch Gouty RN, BSN Entered By: Baruch Gouty on  04/01/2020 15:46:11 -------------------------------------------------------------------------------- Wound Assessment Details Patient Name: Date of Service: [name], [name] 04/01/2020 3:15 PM Medical Record Number: [id number] Patient Account Number: [id number] Date of Birth/Sex: Treating RN: 03-15-1946 (74 y.o. [name] Primary Care Gaylord Seydel: [name] Other Clinician: Referring Glennis Borger: Treating Jabreel Chimento/Extender: [name] in Treatment: 3 Wound Status Wound Number: 1 Primary Dehisced Wound Etiology: Wound Location: Left, Lateral Foot Wound Open Wounding Event: Gradually Appeared Status: Date Acquired: 02/03/2019 Comorbid Cataracts, Glaucoma, Anemia, Deep Vein Thrombosis, Weeks Of Treatment: 3 History: Hypertension, Peripheral Venous Disease, Osteoarthritis Clustered Wound: No Wound Measurements Length: (cm) 2.2 Width: (cm) 0.7 Depth: (cm) 0.3 Area: (cm) 1.21 Volume: (cm) 0.363 %  Reduction in Area: 43% % Reduction in Volume: 42.9% Epithelialization: Small (1-33%) Tunneling: No Undermining: No Wound Description Classification: Full Thickness Without Exposed Support Structures Wound Margin: Distinct, outline attached Exudate Amount: Small Exudate Type: Serosanguineous Exudate Color: red, brown Foul Odor After Cleansing: No Slough/Fibrino Yes Wound Bed Granulation Amount: Large (67-100%) Exposed Structure Granulation Quality: Pink, Pale Fascia Exposed: No Necrotic Amount: Small (1-33%) Fat Layer (Subcutaneous Tissue) Exposed: Yes Necrotic Quality: Adherent Slough Tendon Exposed: No Muscle Exposed: No Joint Exposed: No Bone Exposed: No Treatment Notes Wound #1 (Left, Lateral Foot) 1. Cleanse With Wound Cleanser 2. Periwound Care Moisturizing lotion 3. Primary Dressing Applied Collegen AG 4. Secondary Dressing Dry Gauze Drawtex 6. Support Layer Applied 3 layer compression wrap Notes netting Electronic  Signature(s) Signed: 04/01/2020 6:05:34 PM By: Baruch Gouty RN, BSN Entered By: Baruch Gouty on 04/01/2020 15:44:50 -------------------------------------------------------------------------------- Griffin Details Patient Name: Date of Service: [name], [name] 04/01/2020 3:15 PM Medical Record Number: [id number] Patient Account Number: [id number] Date of Birth/Sex: Treating RN: May 30, 1946 (74 y.o. [name] Primary Care Cope Marte: [name] Other Clinician: Referring Aricka Goldberger: Treating Ashmi Blas/Extender: [name] in Treatment: 3 Vital Signs Time Taken: 15:40 Temperature (F): 98.2 Height (in): 74 Pulse (bpm): 77 Source: Stated Respiratory Rate (breaths/min): 20 Weight (lbs): 260 Blood Pressure (mmHg): 128/70 Source: Stated Reference Range: 80 - 120 mg / dl Body Mass Index (BMI): 33.4 Electronic Signature(s) Signed: 04/01/2020 6:05:34 PM By: Baruch Gouty RN, BSN Entered By: Baruch Gouty on 04/01/2020 15:41:31

## 2020-04-02 NOTE — Progress Notes (Signed)
LOI, RENNAKER ([id number]) Visit Report for 04/01/2020 SuperBill Details Patient Name: Date of Service: Jarvis Newcomer 04/01/2020 Medical Record Number: [id number] Patient Account Number: [id number] Date of Birth/Sex: Treating RN: Jan 18, 1946 (74 y.o. 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

## 2020-04-08 ENCOUNTER — Encounter (HOSPITAL_BASED_OUTPATIENT_CLINIC_OR_DEPARTMENT_OTHER): Payer: Medicare Other | Admitting: Internal Medicine

## 2020-04-08 ENCOUNTER — Other Ambulatory Visit: Payer: Self-pay

## 2020-04-08 DIAGNOSIS — H409 Unspecified glaucoma: Secondary | ICD-10-CM | POA: Diagnosis not present

## 2020-04-08 DIAGNOSIS — L97811 Non-pressure chronic ulcer of other part of right lower leg limited to breakdown of skin: Secondary | ICD-10-CM | POA: Diagnosis not present

## 2020-04-08 DIAGNOSIS — I110 Hypertensive heart disease with heart failure: Secondary | ICD-10-CM | POA: Diagnosis not present

## 2020-04-08 DIAGNOSIS — D649 Anemia, unspecified: Secondary | ICD-10-CM | POA: Diagnosis not present

## 2020-04-08 DIAGNOSIS — I5032 Chronic diastolic (congestive) heart failure: Secondary | ICD-10-CM | POA: Diagnosis not present

## 2020-04-08 DIAGNOSIS — I10 Essential (primary) hypertension: Secondary | ICD-10-CM

## 2020-04-08 DIAGNOSIS — I872 Venous insufficiency (chronic) (peripheral): Secondary | ICD-10-CM | POA: Diagnosis not present

## 2020-04-08 DIAGNOSIS — Z86718 Personal history of other venous thrombosis and embolism: Secondary | ICD-10-CM | POA: Diagnosis not present

## 2020-04-08 DIAGNOSIS — T8131XA Disruption of external operation (surgical) wound, not elsewhere classified, initial encounter: Secondary | ICD-10-CM | POA: Diagnosis not present

## 2020-04-08 DIAGNOSIS — T8131XD Disruption of external operation (surgical) wound, not elsewhere classified, subsequent encounter: Secondary | ICD-10-CM | POA: Diagnosis not present

## 2020-04-08 DIAGNOSIS — L97522 Non-pressure chronic ulcer of other part of left foot with fat layer exposed: Secondary | ICD-10-CM | POA: Diagnosis not present

## 2020-04-08 MED ORDER — VERAPAMIL HCL ER 180 MG PO TBCR: 180.0000 mg | EXTENDED_RELEASE_TABLET | Freq: Every day | ORAL | 3 refills | Status: DC
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
## 2020-04-09 NOTE — Progress Notes (Signed)
DRAYTON, TIEU ([id number]) Visit Report for 04/08/2020 Arrival Information Details Patient Name: Date of Service: Jarvis Newcomer 04/08/2020 2:45 PM Medical Record Number: [id number] Patient Account Number: [id number] Date of Birth/Sex: Treating RN: 03/11/1946 (74 y.o. Lorette Ang, Meta.Reding Primary Care Abryanna Musolino: [name] Other Clinician: Referring Skylene Deremer: Treating Tian Mcmurtrey/Extender: [name] in Treatment: 4 Visit Information History Since Last Visit Added or deleted any medications: No Patient Arrived: [name] Any new allergies or adverse reactions: No Arrival Time: 14:51 Had a fall or experienced change in No Accompanied By: self activities of daily living that may affect Transfer Assistance: None risk of falls: Patient Identification Verified: Yes Signs or symptoms of abuse/neglect since last visito No Secondary Verification Process Completed: Yes Hospitalized since last visit: No Patient Requires Transmission-Based Precautions: No Implantable device outside of the clinic excluding No Patient Has Alerts: No cellular tissue based products placed in the center since last visit: Has Dressing in Place as Prescribed: Yes Pain Present Now: No Electronic Signature(s) Signed: 04/09/2020 11:16:13 AM By: Sandre Kitty Entered By: Sandre Kitty on 04/08/2020 14:52:51 -------------------------------------------------------------------------------- Compression Therapy Details Patient Name: Date of Service: [name], [name] 04/08/2020 2:45 PM Medical Record Number: [id number] Patient Account Number: [id number] Date of Birth/Sex: Treating RN: [birth year] (74 y.o. [name] Primary Care Sonny Poth: [name] Other Clinician: Referring Satoru Milich: Treating Sharis Keeran/Extender: [name] in Treatment: 4 Compression Therapy Performed for Wound Assessment: Wound #1 Left,Lateral Foot Performed By: Clinician  [name], RN Compression Type: Three Layer Post Procedure Diagnosis Same as Pre-procedure Electronic Signature(s) Signed: 04/08/2020 5:51:00 PM By: [name] Entered By: [name] on 04/08/2020 15:29:56 -------------------------------------------------------------------------------- Lower Extremity Assessment Details Patient Name: Date of Service: Jarvis Newcomer 04/08/2020 2:45 PM Medical Record Number: [id number] Patient Account Number: [id number] Date of Birth/Sex: Treating RN: [birth year] (74 y.o. [name] Primary Care Wauneta Silveria: [name] Other Clinician: Referring Cristal Qadir: Treating Madisun Hargrove/Extender: [name] in Treatment: 4 Edema Assessment Assessed: [name]: No] [name]: No] Edema: [Left: Ye] [Right: s] Calf Left: Right: Point of Measurement: From Medial Instep 39.5 cm Ankle Left: Right: Point of Measurement: From Medial Instep 25.5 cm Vascular Assessment Pulses: Dorsalis Pedis Palpable: [Left:Yes] Electronic Signature(s) Signed: 04/08/2020 5:50:35 PM By: Baruch Gouty RN, BSN Entered By: Baruch Gouty on 04/08/2020 15:05:56 -------------------------------------------------------------------------------- Multi Wound Chart Details Patient Name: Date of Service: [name], [name] 04/08/2020 2:45 PM Medical Record Number: [id number] Patient Account Number: [id number] Date of Birth/Sex: Treating RN: 1945-09-11 (74 y.o. Lorette Ang, Meta.Reding Primary Care Emmert Roethler: [name] Other Clinician: Referring Daja Shuping: Treating Mileena Rothenberger/Extender: [name] in Treatment: 4 Vital Signs Height(in): 8 Pulse(bpm): 61 Weight(lbs): 68 Blood Pressure(mmHg): 127/76 Body Mass Index(BMI): 33 Temperature(F): 98.7 Respiratory Rate(breaths/min): 20 Photos: [1:No Photos Left, Lateral Foot] [N/A:N/A N/A] Wound Location: [1:Gradually Appeared] [N/A:N/A] Wounding Event: [1:Dehisced Wound]  [N/A:N/A] Primary Etiology: [1:Cataracts, Glaucoma, Anemia, Deep] [N/A:N/A] Comorbid History: [1:Vein Thrombosis, Hypertension, Peripheral Venous Disease, Osteoarthritis 02/03/2019] [N/A:N/A] Date Acquired: [1:4] [N/A:N/A] Weeks of Treatment: [1:Open] [N/A:N/A] Wound Status: [1:2x0.7x0.3] [N/A:N/A] Measurements L x W x D (cm) [1:1.1] [N/A:N/A] A (cm) : rea [1:0.33] [N/A:N/A] Volume (cm) : [1:48.10%] [N/A:N/A] % Reduction in Area: [1:48.10%] [N/A:N/A] % Reduction in Volume: [1:Full Thickness Without Exposed] [N/A:N/A] Classification: [1:Support Structures Small] [N/A:N/A] Exudate A mount: [1:Serosanguineous] [N/A:N/A] Exudate Type: [1:red, brown] [N/A:N/A] Exudate Color: [1:Distinct, outline attached] [N/A:N/A] Wound Margin: [1:Medium (34-66%)] [N/A:N/A] Granulation A mount: [1:Pink, Pale] [N/A:N/A] Granulation Quality: [1:Medium (34-66%)] [N/A:N/A]  Necrotic A mount: [1:Fat Layer (Subcutaneous Tissue): Yes N/A] Exposed Structures: [1:Fascia: No Tendon: No Muscle: No Joint: No Bone: No None] [N/A:N/A] Epithelialization: [1:Debridement - Excisional] [N/A:N/A] Debridement: Pre-procedure Verification/Time Out 15:25 [N/A:N/A] Taken: [1:Lidocaine 4% Topical Solution] [N/A:N/A] Pain Control: [1:Subcutaneous, Slough] [N/A:N/A] Tissue Debrided: [1:Skin/Subcutaneous Tissue] [N/A:N/A] Level: [1:1.4] [N/A:N/A] Debridement A (sq cm): [1:rea Curette] [N/A:N/A] Instrument: [1:Minimum] [N/A:N/A] Bleeding: [1:Pressure] [N/A:N/A] Hemostasis A chieved: [1:0] [N/A:N/A] Procedural Pain: [1:0] [N/A:N/A] Post Procedural Pain: [1:Procedure was tolerated well] [N/A:N/A] Debridement Treatment Response: [1:2x0.7x0.3] [N/A:N/A] Post Debridement Measurements L x W x D (cm) [1:0.33] [N/A:N/A] Post Debridement Volume: (cm) [1:Compression Therapy] [N/A:N/A] Procedures Performed: [1:Debridement] Treatment Notes Electronic Signature(s) Signed: 04/08/2020 5:33:07 PM By: Linton Ham MD Signed:  04/08/2020 5:51:00 PM By: [name] Entered By: Linton Ham on 04/08/2020 15:49:12 -------------------------------------------------------------------------------- Multi-Disciplinary Care Plan Details Patient Name: Date of Service: [name], Tobey Bride D. 04/08/2020 2:45 PM Medical Record Number: [id number] Patient Account Number: [id number] Date of Birth/Sex: Treating RN: 02/06/1946 (73 y.o. [name] Primary Care Kameryn Davern: [name] Other Clinician: Referring Kaylin Marcon: Treating Knolan Simien/Extender: [name] in Treatment: 4 Active Inactive Wound/Skin Impairment Nursing Diagnoses: Knowledge deficit related to ulceration/compromised skin integrity Goals: Patient/caregiver will verbalize understanding of skin care regimen Date Initiated: 03/09/2020 Target Resolution Date: 05/21/2020 Goal Status: Active Ulcer/skin breakdown will have a volume reduction of 30% by week 4 Date Initiated: 03/09/2020 Target Resolution Date: 05/28/2020 Goal Status: Active Interventions: Assess patient/caregiver ability to obtain necessary supplies Assess patient/caregiver ability to perform ulcer/skin care regimen upon admission and as needed Assess ulceration(s) every visit Notes: Electronic Signature(s) Signed: 04/08/2020 5:51:00 PM By: [name] Entered By: [name] on 04/08/2020 15:32:30 -------------------------------------------------------------------------------- Pain Assessment Details Patient Name: Date of Service: [name], [name] 04/08/2020 2:45 PM Medical Record Number: [id number] Patient Account Number: [id number] Date of Birth/Sex: Treating RN: 05-Apr-1946 (74 y.o. [name] Primary Care Hagop Mccollam: [name] Other Clinician: Referring Trisha Ken: Treating Edynn Gillock/Extender: [name] in Treatment: 4 Active Problems Location of Pain Severity and Description of Pain Patient Has Paino No Site  Locations Pain Management and Medication Current Pain Management: Electronic Signature(s) Signed: 04/08/2020 5:51:00 PM By: [name] Signed: 04/09/2020 11:16:13 AM By: Sandre Kitty Entered By: Sandre Kitty on 04/08/2020 14:53:11 -------------------------------------------------------------------------------- Patient/Caregiver Education Details Patient Name: Date of Service: Posey Boyer D. 10/14/2021andnbsp2:45 PM Medical Record Number: [id number] Patient Account Number: [id number] Date of Birth/Gender: Treating RN: 1945-12-01 (74 y.o. [name] Primary Care Physician: [name] Other Clinician: Referring Physician: Treating Physician/Extender: [name] in Treatment: 4 Education Assessment Education Provided To: Patient Education Topics Provided Wound/Skin Impairment: Handouts: Skin Care Do's and Dont's Methods: Explain/Verbal Responses: Reinforcements needed Electronic Signature(s) Signed: 04/08/2020 5:51:00 PM By: [name] Entered By: [name] on 04/08/2020 15:32:42 -------------------------------------------------------------------------------- Wound Assessment Details Patient Name: Date of Service: [name], [name] 04/08/2020 2:45 PM Medical Record Number: [id number] Patient Account Number: [id number] Date of Birth/Sex: Treating RN: 07/01/1945 (74 y.o. Lorette Ang, Meta.Reding Primary Care Elison Worrel: [name] Other Clinician: Referring Zayne Draheim: Treating Nicholis Stepanek/Extender: [name] in Treatment: 4 Wound Status Wound Number: 1 Primary Dehisced Wound Etiology: Wound Location: Left, Lateral Foot Wound Open Wounding Event: Gradually Appeared Status: Date Acquired: 02/03/2019 Comorbid Cataracts, Glaucoma, Anemia, Deep Vein Thrombosis, Weeks Of Treatment: 4 History: Hypertension, Peripheral Venous Disease, Osteoarthritis Clustered Wound: No Wound Measurements Length: (cm)  2 Width: (cm) 0.7 Depth: (cm) 0.3 Area: (cm) 1.1 Volume: (cm) 0.33 %  Reduction in Area: 48.1% % Reduction in Volume: 48.1% Epithelialization: None Tunneling: No Undermining: No Wound Description Classification: Full Thickness Without Exposed Support Structures Wound Margin: Distinct, outline attached Exudate Amount: Small Exudate Type: Serosanguineous Exudate Color: red, brown Foul Odor After Cleansing: No Slough/Fibrino Yes Wound Bed Granulation Amount: Medium (34-66%) Exposed Structure Granulation Quality: Pink, Pale Fascia Exposed: No Necrotic Amount: Medium (34-66%) Fat Layer (Subcutaneous Tissue) Exposed: Yes Necrotic Quality: Adherent Slough Tendon Exposed: No Muscle Exposed: No Joint Exposed: No Bone Exposed: No Electronic Signature(s) Signed: 04/08/2020 5:50:35 PM By: Baruch Gouty RN, BSN Signed: 04/08/2020 5:51:00 PM By: [name] Entered By: Baruch Gouty on 04/08/2020 15:06:31 -------------------------------------------------------------------------------- Vitals Details Patient Name: Date of Service: [name], [name] 04/08/2020 2:45 PM Medical Record Number: [id number] Patient Account Number: [id number] Date of Birth/Sex: Treating RN: 08-27-1945 (74 y.o. Lorette Ang, Meta.Reding Primary Care Jhoel Stieg: [name] Other Clinician: Referring Dangela How: Treating Walden Statz/Extender: [name] in Treatment: 4 Vital Signs Time Taken: 14:52 Temperature (F): 98.7 Height (in): 74 Pulse (bpm): 76 Weight (lbs): 260 Respiratory Rate (breaths/min): 20 Body Mass Index (BMI): 33.4 Blood Pressure (mmHg): 127/76 Reference Range: 80 - 120 mg / dl Electronic Signature(s) Signed: 04/09/2020 11:16:13 AM By: Sandre Kitty Entered By: Sandre Kitty on 04/08/2020 14:53:06

## 2020-04-16 ENCOUNTER — Other Ambulatory Visit: Payer: Self-pay

## 2020-04-16 ENCOUNTER — Encounter (HOSPITAL_BASED_OUTPATIENT_CLINIC_OR_DEPARTMENT_OTHER): Payer: Medicare Other | Admitting: Internal Medicine

## 2020-04-16 DIAGNOSIS — L97811 Non-pressure chronic ulcer of other part of right lower leg limited to breakdown of skin: Secondary | ICD-10-CM | POA: Diagnosis not present

## 2020-04-16 DIAGNOSIS — Z86718 Personal history of other venous thrombosis and embolism: Secondary | ICD-10-CM | POA: Diagnosis not present

## 2020-04-16 DIAGNOSIS — I5032 Chronic diastolic (congestive) heart failure: Secondary | ICD-10-CM | POA: Diagnosis not present

## 2020-04-16 DIAGNOSIS — I110 Hypertensive heart disease with heart failure: Secondary | ICD-10-CM | POA: Diagnosis not present

## 2020-04-16 DIAGNOSIS — T8131XD Disruption of external operation (surgical) wound, not elsewhere classified, subsequent encounter: Secondary | ICD-10-CM | POA: Diagnosis not present

## 2020-04-16 DIAGNOSIS — D649 Anemia, unspecified: Secondary | ICD-10-CM | POA: Diagnosis not present

## 2020-04-16 DIAGNOSIS — L97522 Non-pressure chronic ulcer of other part of left foot with fat layer exposed: Secondary | ICD-10-CM | POA: Diagnosis not present

## 2020-04-16 DIAGNOSIS — I872 Venous insufficiency (chronic) (peripheral): Secondary | ICD-10-CM | POA: Diagnosis not present

## 2020-04-16 DIAGNOSIS — H409 Unspecified glaucoma: Secondary | ICD-10-CM | POA: Diagnosis not present

## 2020-04-16 DIAGNOSIS — T8131XA Disruption of external operation (surgical) wound, not elsewhere classified, initial encounter: Secondary | ICD-10-CM | POA: Diagnosis not present
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
## 2020-04-16 NOTE — Progress Notes (Signed)
AVIEN, TAHA ([id number]) Visit Report for 04/16/2020 Arrival Information Details Patient Name: Date of Service: [name] 04/16/2020 2:15 PM Medical Record Number: [id number] Patient Account Number: [id number] Date of Birth/Sex: Treating RN: 1946-03-29 (74 y.o. Marvis Repress Primary Care Kaneshia Cater: [name] Other Clinician: Referring Jaxtin Raimondo: Treating Akirah Storck/Extender: [name] in Treatment: 5 Visit Information History Since Last Visit Added or deleted any medications: No Patient Arrived: [name] Any new allergies or adverse reactions: No Arrival Time: 14:39 Had a fall or experienced change in No Accompanied By: self activities of daily living that may affect Transfer Assistance: None risk of falls: Patient Identification Verified: Yes Signs or symptoms of abuse/neglect since last visito No Secondary Verification Process Completed: Yes Hospitalized since last visit: No Patient Requires Transmission-Based Precautions: No Implantable device outside of the clinic excluding No Patient Has Alerts: No cellular tissue based products placed in the center since last visit: Has Dressing in Place as Prescribed: Yes Has Compression in Place as Prescribed: Yes Pain Present Now: No Electronic Signature(s) Signed: 04/16/2020 5:00:52 PM By: Kela Millin Entered By: Kela Millin on 04/16/2020 14:40:09 -------------------------------------------------------------------------------- Compression Therapy Details Patient Name: Date of Service: [name], Shawn BERT D. 04/16/2020 2:15 PM Medical Record Number: [id number] Patient Account Number: [id number] Date of Birth/Sex: Treating RN: Feb 24, 1946 (74 y.o. Ernestene [name] Primary Care Ferdie Bakken: [name] Other Clinician: Referring Makynlee Kressin: Treating Elnore Cosens/Extender: [name] in Treatment: 5 Compression Therapy Performed for Wound Assessment: Wound  #1 Left,Lateral Foot Performed By: Clinician [name], RN Compression Type: Three Layer Post Procedure Diagnosis Same as Pre-procedure Electronic Signature(s) Signed: 04/16/2020 5:21:31 PM By: Baruch Gouty RN, BSN Entered By: Baruch Gouty on 04/16/2020 15:09:01 -------------------------------------------------------------------------------- Encounter Discharge Information Details Patient Name: Date of Service: [name], Shawn BERT D. 04/16/2020 2:15 PM Medical Record Number: [id number] Patient Account Number: [id number] Date of Birth/Sex: Treating RN: [birth year] (74 y.o. [name] Primary Care Raysha Tilmon: [name] Other Clinician: Referring Cyrena Kuchenbecker: Treating Shellsea Borunda/Extender: [name] in Treatment: 5 Encounter Discharge Information Items Post Procedure Vitals Discharge Condition: Stable Temperature (F): 98.6 Ambulatory Status: Walker Pulse (bpm): 80 Discharge Destination: Home Respiratory Rate (breaths/min): 20 Transportation: Private Auto Blood Pressure (mmHg): 119/72 Accompanied By: self Schedule Follow-up Appointment: Yes Clinical Summary of Care: Electronic Signature(s) Signed: 04/16/2020 5:18:39 PM By: [name] Entered By: [name] on 04/16/2020 15:33:12 -------------------------------------------------------------------------------- Lower Extremity Assessment Details Patient Name: Date of Service: [name], Shawn BERT D. 04/16/2020 2:15 PM Medical Record Number: [id number] Patient Account Number: [id number] Date of Birth/Sex: Treating RN: 1945/08/25 (74 y.o. Marvis Repress Primary Care Ajmal Kathan: [name] Other Clinician: Referring Kenney Going: Treating Taylan Mayhan/Extender: [name] in Treatment: 5 Edema Assessment Assessed: [name]: No] [name]: No] Edema: [Left: Ye] [Right: s] Calf Left: Right: Point of Measurement: From Medial Instep 40 cm Ankle Left: Right: Point of  Measurement: From Medial Instep 26 cm Vascular Assessment Pulses: Dorsalis Pedis Palpable: [Left:No] Electronic Signature(s) Signed: 04/16/2020 5:00:52 PM By: Kela Millin Entered By: Kela Millin on 04/16/2020 14:41:17 -------------------------------------------------------------------------------- Multi Wound Chart Details Patient Name: Date of Service: [name], Shawn BERT D. 04/16/2020 2:15 PM Medical Record Number: [id number] Patient Account Number: [id number] Date of Birth/Sex: Treating RN: 04/17/1946 (74 y.o. Ernestene [name] Primary Care Mariyana Fulop: [name] Other Clinician: Referring Tacy Chavis: Treating Clorene Nerio/Extender: [name] in Treatment: 5 Vital Signs Height(in): 74 Pulse(bpm): 80 Weight(lbs): 260 Blood Pressure(mmHg): 119/72 Body Mass Index(BMI): 33 Temperature(F): 98.6 Respiratory  Rate(breaths/min): 20 Photos: [1:No Photos Left, Lateral Foot] [N/A:N/A N/A] Wound Location: [1:Gradually Appeared] [N/A:N/A] Wounding Event: [1:Dehisced Wound] [N/A:N/A] Primary Etiology: [1:Cataracts, Glaucoma, Anemia, Deep N/A] Comorbid History: [1:Vein Thrombosis, Hypertension, Peripheral Venous Disease, Osteoarthritis 02/03/2019] [N/A:N/A] Date Acquired: [1:5] [N/A:N/A] Weeks of Treatment: [1:Open] [N/A:N/A] Wound Status: [1:2x0.8x0.7] [N/A:N/A] Measurements L x W x D (cm) [1:1.257] [N/A:N/A] A (cm) : rea [1:0.88] [N/A:N/A] Volume (cm) : [1:40.70%] [N/A:N/A] % Reduction in A [1:rea: -38.40%] [N/A:N/A] % Reduction in Volume: [1:Full Thickness Without Exposed] [N/A:N/A] Classification: [1:Support Structures Small] [N/A:N/A] Exudate A mount: [1:Serosanguineous] [N/A:N/A] Exudate Type: [1:red, brown] [N/A:N/A] Exudate Color: [1:Well defined, not attached] [N/A:N/A] Wound Margin: [1:Small (1-33%)] [N/A:N/A] Granulation A mount: [1:Pink, Pale] [N/A:N/A] Granulation Quality: [1:Large (67-100%)] [N/A:N/A] Necrotic A mount: [1:Fat  Layer (Subcutaneous Tissue): Yes N/A] Exposed Structures: [1:Fascia: No Tendon: No Muscle: No Joint: No Bone: No None] [N/A:N/A] Epithelialization: [1:Debridement - Excisional] [N/A:N/A] Debridement: Pre-procedure Verification/Time Out 15:00 [N/A:N/A] Taken: [1:Other] [N/A:N/A] Pain Control: [1:Subcutaneous, Slough] [N/A:N/A] Tissue Debrided: [1:Skin/Subcutaneous Tissue] [N/A:N/A] Level: [1:1.6] [N/A:N/A] Debridement A (sq cm): [1:rea Curette] [N/A:N/A] Instrument: [1:Minimum] [N/A:N/A] Bleeding: [1:Pressure] [N/A:N/A] Hemostasis A chieved: [1:4] [N/A:N/A] Procedural Pain: [1:1] [N/A:N/A] Post Procedural Pain: [1:Procedure was tolerated well] [N/A:N/A] Debridement Treatment Response: [1:2x0.8x0.7] [N/A:N/A] Post Debridement Measurements L x W x D (cm) [1:0.88] [N/A:N/A] Post Debridement Volume: (cm) [1:Debridement] [N/A:N/A] Treatment Notes Electronic Signature(s) Signed: 04/16/2020 4:52:36 PM By: Linton Ham MD Signed: 04/16/2020 5:21:31 PM By: Baruch Gouty RN, BSN Signed: 04/16/2020 5:21:31 PM By: Baruch Gouty RN, BSN Entered By: Linton Ham on 04/16/2020 15:06:47 -------------------------------------------------------------------------------- Multi-Disciplinary Care Plan Details Patient Name: Date of Service: [name], Shawn BERT D. 04/16/2020 2:15 PM Medical Record Number: [id number] Patient Account Number: [id number] Date of Birth/Sex: Treating RN: 1946/06/06 (74 y.o. Ernestene [name] Primary Care Lannie Heaps: [name] Other Clinician: Referring Kacey Dysert: Treating Jahnessa Vanduyn/Extender: [name] in Treatment: 5 Active Inactive Wound/Skin Impairment Nursing Diagnoses: Knowledge deficit related to ulceration/compromised skin integrity Goals: Patient/caregiver will verbalize understanding of skin care regimen Date Initiated: 03/09/2020 Target Resolution Date: 05/21/2020 Goal Status: Active Ulcer/skin breakdown will have a volume  reduction of 30% by week 4 Date Initiated: 03/09/2020 Date Inactivated: 04/16/2020 Target Resolution Date: 05/28/2020 Goal Status: Unmet Unmet Reason: necrotic Ulcer/skin breakdown will have a volume reduction of 50% by week 8 Date Initiated: 04/16/2020 Target Resolution Date: 05/14/2020 Goal Status: Active Interventions: Assess patient/caregiver ability to obtain necessary supplies Assess patient/caregiver ability to perform ulcer/skin care regimen upon admission and as needed Assess ulceration(s) every visit Notes: Electronic Signature(s) Signed: 04/16/2020 5:21:31 PM By: Baruch Gouty RN, BSN Entered By: Baruch Gouty on 04/16/2020 15:02:18 -------------------------------------------------------------------------------- Pain Assessment Details Patient Name: Date of Service: [name], Shawn BERT D. 04/16/2020 2:15 PM Medical Record Number: [id number] Patient Account Number: [id number] Date of Birth/Sex: Treating RN: 1945-09-23 (74 y.o. Marvis Repress Primary Care Emberly Tomasso: [name] Other Clinician: Referring Jennica Tagliaferri: Treating Saul Fabiano/Extender: [name] in Treatment: 5 Active Problems Location of Pain Severity and Description of Pain Patient Has Paino No Site Locations Pain Management and Medication Current Pain Management: Electronic Signature(s) Signed: 04/16/2020 5:00:52 PM By: Kela Millin Entered By: Kela Millin on 04/16/2020 14:41:07 -------------------------------------------------------------------------------- Patient/Caregiver Education Details Patient Name: Date of Service: Posey Boyer D. 10/22/2021andnbsp2:15 PM Medical Record Number: [id number] Patient Account Number: [id number] Date of Birth/Gender: Treating RN: 04-17-1946 (74 y.o. Ernestene [name] Primary Care Physician: [name] Other Clinician: Referring Physician: Treating Physician/Extender: [name] in  Treatment: 5 Education Assessment Education  Provided To: Patient Education Topics Provided Wound/Skin Impairment: Methods: Explain/Verbal Responses: Reinforcements needed, State content correctly Electronic Signature(s) Signed: 04/16/2020 5:21:31 PM By: Baruch Gouty RN, BSN Entered By: Baruch Gouty on 04/16/2020 15:04:09 -------------------------------------------------------------------------------- Wound Assessment Details Patient Name: Date of Service: [name], Shawn BERT D. 04/16/2020 2:15 PM Medical Record Number: [id number] Patient Account Number: [id number] Date of Birth/Sex: Treating RN: 10/27/1945 (73 y.o. Marvis Repress Primary Care Farron Lafond: [name] Other Clinician: Referring Teondra Newburg: Treating Xaden Kaufman/Extender: [name] in Treatment: 5 Wound Status Wound Number: 1 Primary Dehisced Wound Etiology: Wound Location: Left, Lateral Foot Wound Open Wounding Event: Gradually Appeared Status: Date Acquired: 02/03/2019 Comorbid Cataracts, Glaucoma, Anemia, Deep Vein Thrombosis, Weeks Of Treatment: 5 History: Hypertension, Peripheral Venous Disease, Osteoarthritis Clustered Wound: No Wound Measurements Length: (cm) 2 Width: (cm) 0.8 Depth: (cm) 0.7 Area: (cm) 1.257 Volume: (cm) 0.88 % Reduction in Area: 40.7% % Reduction in Volume: -38.4% Epithelialization: None Tunneling: No Undermining: No Wound Description Classification: Full Thickness Without Exposed Support Structures Wound Margin: Well defined, not attached Exudate Amount: Small Exudate Type: Serosanguineous Exudate Color: red, brown Foul Odor After Cleansing: No Slough/Fibrino Yes Wound Bed Granulation Amount: Small (1-33%) Exposed Structure Granulation Quality: Pink, Pale Fascia Exposed: No Necrotic Amount: Large (67-100%) Fat Layer (Subcutaneous Tissue) Exposed: Yes Necrotic Quality: Adherent Slough Tendon Exposed: No Muscle Exposed: No Joint  Exposed: No Bone Exposed: No Treatment Notes Wound #1 (Left, Lateral Foot) 1. Cleanse With Wound Cleanser 2. Periwound Care Moisturizing lotion 3. Primary Dressing Applied Iodoflex 4. Secondary Dressing Dry Gauze 6. Support Layer Applied 3 layer compression wrap Notes netting Electronic Signature(s) Signed: 04/16/2020 5:00:52 PM By: Kela Millin Entered By: Kela Millin on 04/16/2020 14:41:47 -------------------------------------------------------------------------------- Vitals Details Patient Name: Date of Service: [name], Shawn BERT D. 04/16/2020 2:15 PM Medical Record Number: [id number] Patient Account Number: [id number] Date of Birth/Sex: Treating RN: January 25, 1946 (74 y.o. Marvis Repress Primary Care Alberto Schoch: [name] Other Clinician: Referring Aleksander Edmiston: Treating Arthur Aydelotte/Extender: [name] in Treatment: 5 Vital Signs Time Taken: 14:40 Temperature (F): 98.6 Height (in): 74 Pulse (bpm): 80 Weight (lbs): 260 Respiratory Rate (breaths/min): 20 Body Mass Index (BMI): 33.4 Blood Pressure (mmHg): 119/72 Reference Range: 80 - 120 mg / dl Electronic Signature(s) Signed: 04/16/2020 5:00:52 PM By: Kela Millin Entered By: Kela Millin on 04/16/2020 14:40:36

## 2020-04-22 ENCOUNTER — Other Ambulatory Visit: Payer: Self-pay

## 2020-04-23 ENCOUNTER — Other Ambulatory Visit: Payer: Self-pay

## 2020-04-23 ENCOUNTER — Encounter (HOSPITAL_BASED_OUTPATIENT_CLINIC_OR_DEPARTMENT_OTHER): Payer: Medicare Other | Admitting: Internal Medicine

## 2020-04-23 DIAGNOSIS — I5032 Chronic diastolic (congestive) heart failure: Secondary | ICD-10-CM | POA: Diagnosis not present

## 2020-04-23 DIAGNOSIS — T8131XA Disruption of external operation (surgical) wound, not elsewhere classified, initial encounter: Secondary | ICD-10-CM | POA: Diagnosis not present

## 2020-04-23 DIAGNOSIS — Z86718 Personal history of other venous thrombosis and embolism: Secondary | ICD-10-CM | POA: Diagnosis not present

## 2020-04-23 DIAGNOSIS — L97812 Non-pressure chronic ulcer of other part of right lower leg with fat layer exposed: Secondary | ICD-10-CM | POA: Diagnosis not present

## 2020-04-23 DIAGNOSIS — D649 Anemia, unspecified: Secondary | ICD-10-CM | POA: Diagnosis not present

## 2020-04-23 DIAGNOSIS — H409 Unspecified glaucoma: Secondary | ICD-10-CM | POA: Diagnosis not present

## 2020-04-23 DIAGNOSIS — I872 Venous insufficiency (chronic) (peripheral): Secondary | ICD-10-CM | POA: Diagnosis not present

## 2020-04-23 DIAGNOSIS — L97522 Non-pressure chronic ulcer of other part of left foot with fat layer exposed: Secondary | ICD-10-CM | POA: Diagnosis not present

## 2020-04-23 DIAGNOSIS — L97811 Non-pressure chronic ulcer of other part of right lower leg limited to breakdown of skin: Secondary | ICD-10-CM | POA: Diagnosis not present

## 2020-04-23 DIAGNOSIS — T8131XD Disruption of external operation (surgical) wound, not elsewhere classified, subsequent encounter: Secondary | ICD-10-CM | POA: Diagnosis not present

## 2020-04-23 DIAGNOSIS — I110 Hypertensive heart disease with heart failure: Secondary | ICD-10-CM | POA: Diagnosis not present

## 2020-04-23 MED ORDER — PANTOPRAZOLE SODIUM 40 MG PO TBEC: 40.0000 mg | DELAYED_RELEASE_TABLET | Freq: Two times a day (BID) | ORAL | 0 refills | Status: DC

## 2020-04-26 NOTE — Progress Notes (Signed)
[name] ([id number]) Visit Report for 04/23/2020 Arrival Information Details Patient Name: Date of Service: [name] 04/23/2020 1:00 PM Medical Record Number: [id number] Patient Account Number: [id number] Date of Birth/Sex: Treating RN: 1946-06-18 (74 y.o. [name], [name] Primary Care [name]: [name] Other Clinician: Referring [name]: Treating [name]/Extender: [name] in Treatment: 6 Visit Information History Since Last Visit Added or deleted any medications: No Patient Arrived: [name] Any new allergies or adverse reactions: No Arrival Time: 13:16 Had a fall or experienced change in No Accompanied By: alone activities of daily living that may affect Transfer Assistance: None risk of falls: Patient Identification Verified: Yes Signs or symptoms of abuse/neglect since last visito No Secondary Verification Process Completed: Yes Hospitalized since last visit: No Patient Requires Transmission-Based Precautions: No Implantable device outside of the clinic excluding No Patient Has Alerts: No cellular tissue based products placed in the center since last visit: Has Dressing in Place as Prescribed: Yes Pain Present Now: No Electronic Signature(s) Signed: 04/26/2020 5:57:05 PM By: [name] RN, BSN Entered By: [name] on 04/23/2020 13:16:54 -------------------------------------------------------------------------------- Compression Therapy Details Patient Name: Date of Service: Shawn Cornfield, [name] 04/23/2020 1:00 PM Medical Record Number: [id number] Patient Account Number: [id number] Date of Birth/Sex: Treating RN: [birth year] (74 y.o. [name] Primary Care [name]: [name] Other Clinician: Referring [name]: Treating [name]/Extender: [name] in Treatment: 6 Compression Therapy Performed for Wound Assessment: Wound #1 Left,Lateral Foot Performed By: Clinician  Shawn Pilling, RN Compression Type: Three Layer Post Procedure Diagnosis Same as Pre-procedure Electronic Signature(s) Signed: 04/23/2020 4:23:42 PM By: Shawn Gouty RN, BSN Entered By: [name] on 04/23/2020 13:46:21 -------------------------------------------------------------------------------- Compression Therapy Details Patient Name: Date of Service: Shawn Cornfield, [name] 04/23/2020 1:00 PM Medical Record Number: [id number] Patient Account Number: [id number] Date of Birth/Sex: Treating RN: Apr 13, 1946 (74 y.o. [name] Primary Care Everline Mahaffy: [name] Other Clinician: Referring [name]: Treating [name]/Extender: [name] in Treatment: 6 Compression Therapy Performed for Wound Assessment: Wound #2 Right,Anterior Lower Leg Performed By: Clinician Shawn Pilling, RN Compression Type: Three Layer Post Procedure Diagnosis Same as Pre-procedure Electronic Signature(s) Signed: 04/23/2020 4:23:42 PM By: Shawn Gouty RN, BSN Entered By: [name] on 04/23/2020 13:46:21 -------------------------------------------------------------------------------- Encounter Discharge Information Details Patient Name: Date of Service: Shawn Cornfield, [name] 04/23/2020 1:00 PM Medical Record Number: [id number] Patient Account Number: [id number] Date of Birth/Sex: Treating RN: 04-14-1946 (74 y.o. [name] Primary Care [name]: [name] Other Clinician: Referring [name]: Treating [name]/Extender: [name] in Treatment: 6 Encounter Discharge Information Items Discharge Condition: Stable Ambulatory Status: Walker Discharge Destination: Home Transportation: Private Auto Accompanied By: self Schedule Follow-up Appointment: Yes Clinical Summary of Care: Electronic Signature(s) Signed: 04/23/2020 4:26:07 PM By: [name] Entered By: [name] on 04/23/2020  14:49:17 -------------------------------------------------------------------------------- Lower Extremity Assessment Details Patient Name: Date of Service: Shawn Cornfield, [name] 04/23/2020 1:00 PM Medical Record Number: [id number] Patient Account Number: [id number] Date of Birth/Sex: Treating RN: [birth year] (74 y.o. [name] Primary Care [name]: [name] Other Clinician: Referring [name]: Treating [name]/Extender: [name] in Treatment: 6 Edema Assessment Assessed: [name]: No] [name]: No] Edema: [Left: Yes] [Right: Yes] Calf Left: Right: Point of Measurement: From Medial Instep 39.2 cm 46 cm Ankle Left: Right: Point of Measurement: From Medial Instep 26 cm 30.5 cm Vascular Assessment Pulses: Dorsalis Pedis Palpable: [Left:Yes] [Right:Yes] Electronic Signature(s) Signed: 04/26/2020 5:57:05 PM By:  [name] RN, BSN Entered By: [name] on 04/23/2020 13:30:23 -------------------------------------------------------------------------------- Multi Wound Chart Details Patient Name: Date of Service: [name] 04/23/2020 1:00 PM Medical Record Number: [id number] Patient Account Number: [id number] Date of Birth/Sex: Treating RN: 1945/12/27 (74 y.o. [name], [name] Primary Care [name]: [name] Other Clinician: Referring [name]: Treating [name]/Extender: [name] in Treatment: 6 Vital Signs Height(in): 56 Pulse(bpm): 65 Weight(lbs): 19 Blood Pressure(mmHg): 125/75 Body Mass Index(BMI): 33 Temperature(F): 98.4 Respiratory Rate(breaths/min): 18 Photos: [1:No Photos Left, Lateral Foot] [2:No Photos Right, Anterior Lower Leg] [N/A:N/A N/A] Wound Location: [1:Gradually Appeared] [2:Trauma] [N/A:N/A] Wounding Event: [1:Dehisced Wound] [2:Venous Leg Ulcer] [N/A:N/A] Primary Etiology: [1:Cataracts, Glaucoma, Anemia, Deep Cataracts, Glaucoma, Anemia, Deep N/A] Comorbid  History: [1:Vein Thrombosis, Hypertension, Peripheral Venous Disease, Osteoarthritis 02/03/2019] [2:Vein Thrombosis, Hypertension, Peripheral Venous Disease, Osteoarthritis 04/12/2020] [N/A:N/A] Date Acquired: [1:6] [2:0] [N/A:N/A] Weeks of Treatment: [1:Open] [2:Open] [N/A:N/A] Wound Status: [1:No] [2:Yes] [N/A:N/A] Clustered Wound: [1:N/A] [2:3] [N/A:N/A] Clustered Quantity: [1:1.7x0.8x0.5] [2:3.6x3.2x0.1] [N/A:N/A] Measurements L x W x D (cm) [1:1.068] [2:9.048] [N/A:N/A] A (cm) : rea [1:0.534] [2:0.905] [N/A:N/A] Volume (cm) : [1:49.60%] [2:N/A] [N/A:N/A] % Reduction in Area: [1:16.00%] [2:N/A] [N/A:N/A] % Reduction in Volume: [1:Full Thickness Without Exposed] [2:Full Thickness Without Exposed] [N/A:N/A] Classification: [1:Support Structures Medium] [2:Support Structures Medium] [N/A:N/A] Exudate Amount: [1:Serosanguineous] [2:Serosanguineous] [N/A:N/A] Exudate Type: [1:red, brown] [2:red, brown] [N/A:N/A] Exudate Color: [1:Well defined, not attached] [2:Flat and Intact] [N/A:N/A] Wound Margin: [1:Small (1-33%)] [2:Large (67-100%)] [N/A:N/A] Granulation Amount: [1:Pink, Pale] [2:Red] [N/A:N/A] Granulation Quality: [1:Large (67-100%)] [2:None Present (0%)] [N/A:N/A] Necrotic Amount: [1:Fat Layer (Subcutaneous Tissue): Yes Fat Layer (Subcutaneous Tissue): Yes N/A] Exposed Structures: [1:Fascia: No Tendon: No Muscle: No Joint: No Bone: No Small (1-33%)] [2:Fascia: No Tendon: No Muscle: No Joint: No Bone: No None] [N/A:N/A] Epithelialization: [1:Compression Therapy] [2:Compression Therapy] [N/A:N/A] Treatment Notes Electronic Signature(s) Signed: 04/23/2020 4:23:42 PM By: Shawn Gouty RN, BSN Signed: 04/26/2020 2:10:44 PM By: Linton Ham MD Entered By: Linton Ham on 04/23/2020 13:58:17 -------------------------------------------------------------------------------- Multi-Disciplinary Care Plan Details Patient Name: Date of Service: Shawn Cornfield, Tobey Bride D. 04/23/2020 1:00  PM Medical Record Number: [id number] Patient Account Number: [id number] Date of Birth/Sex: Treating RN: 1945-12-09 (74 y.o. [name] Primary Care Jaecion Dempster: [name] Other Clinician: Referring Bettie Capistran: Treating Carolie Mcilrath/Extender: [name] in Treatment: 6 Active Inactive Abuse / Safety / Falls / Self Care Management Nursing Diagnoses: History of Falls Potential for falls Goals: Patient/caregiver will verbalize/demonstrate measures taken to prevent injury and/or falls Date Initiated: 04/23/2020 Target Resolution Date: 05/21/2020 Goal Status: Active Interventions: Assess fall risk on admission and as needed Assess impairment of mobility on admission and as needed per policy Notes: Wound/Skin Impairment Nursing Diagnoses: Knowledge deficit related to ulceration/compromised skin integrity Goals: Patient/caregiver will verbalize understanding of skin care regimen Date Initiated: 03/09/2020 Target Resolution Date: 05/21/2020 Goal Status: Active Ulcer/skin breakdown will have a volume reduction of 30% by week 4 Date Initiated: 03/09/2020 Date Inactivated: 04/16/2020 Target Resolution Date: 05/28/2020 Goal Status: Unmet Unmet Reason: necrotic Ulcer/skin breakdown will have a volume reduction of 50% by week 8 Date Initiated: 04/16/2020 Target Resolution Date: 05/14/2020 Goal Status: Active Interventions: Assess patient/caregiver ability to obtain necessary supplies Assess patient/caregiver ability to perform ulcer/skin care regimen upon admission and as needed Assess ulceration(s) every visit Notes: Electronic Signature(s) Signed: 04/23/2020 4:23:42 PM By: Shawn Gouty RN, BSN Entered By: [name] on 04/23/2020 13:50:23 -------------------------------------------------------------------------------- Pain Assessment Details Patient Name: Date of Service: Shawn Cornfield, [name] 04/23/2020 1:00 PM Medical Record  Number:  [id number] Patient Account Number: [id number] Date of Birth/Sex: Treating RN: [birth year] (74 y.o. [name] Primary Care Charday Capetillo: [name] Other Clinician: Referring Terrel Nesheiwat: Treating Veta Dambrosia/Extender: [name] in Treatment: 6 Active Problems Location of Pain Severity and Description of Pain Patient Has Paino No Site Locations Pain Management and Medication Current Pain Management: Electronic Signature(s) Signed: 04/26/2020 5:57:05 PM By: [name] RN, BSN Entered By: [name] on 04/23/2020 13:17:16 -------------------------------------------------------------------------------- Patient/Caregiver Education Details Patient Name: Date of Service: [name]. 10/29/2021andnbsp1:00 PM Medical Record Number: [id number] Patient Account Number: [id number] Date of Birth/Gender: Treating RN: 26-Sep-1945 (74 y.o. [name] Primary Care Physician: [name] Other Clinician: Referring Physician: Treating Physician/Extender: [name] in Treatment: 6 Education Assessment Education Provided To: Patient Education Topics Provided Offloading: Methods: Explain/Verbal Responses: Reinforcements needed, State content correctly Wound/Skin Impairment: Methods: Explain/Verbal Responses: Reinforcements needed, State content correctly Electronic Signature(s) Signed: 04/23/2020 4:23:42 PM By: Shawn Gouty RN, BSN Entered By: [name] on 04/23/2020 13:41:29 -------------------------------------------------------------------------------- Wound Assessment Details Patient Name: Date of Service: Shawn Cornfield, [name] 04/23/2020 1:00 PM Medical Record Number: [id number] Patient Account Number: [id number] Date of Birth/Sex: Treating RN: 1945/09/12 (74 y.o. [name] Primary Care Alexandro Line: [name] Other Clinician: Referring Sadhana Frater: Treating Evi Mccomb/Extender: [name] in Treatment: 6 Wound Status Wound Number: 1 Primary Dehisced Wound Etiology: Wound Location: Left, Lateral Foot Wound Open Wounding Event: Gradually Appeared Status: Date Acquired: 02/03/2019 Comorbid Cataracts, Glaucoma, Anemia, Deep Vein Thrombosis, Weeks Of Treatment: 6 History: Hypertension, Peripheral Venous Disease, Osteoarthritis Clustered Wound: No Wound Measurements Length: (cm) 1.7 Width: (cm) 0.8 Depth: (cm) 0.5 Area: (cm) 1.068 Volume: (cm) 0.534 % Reduction in Area: 49.6% % Reduction in Volume: 16% Epithelialization: Small (1-33%) Tunneling: No Undermining: No Wound Description Classification: Full Thickness Without Exposed Support Structures Wound Margin: Well defined, not attached Exudate Amount: Medium Exudate Type: Serosanguineous Exudate Color: red, brown Foul Odor After Cleansing: No Slough/Fibrino Yes Wound Bed Granulation Amount: Small (1-33%) Exposed Structure Granulation Quality: Pink, Pale Fascia Exposed: No Necrotic Amount: Large (67-100%) Fat Layer (Subcutaneous Tissue) Exposed: Yes Necrotic Quality: Adherent Slough Tendon Exposed: No Muscle Exposed: No Joint Exposed: No Bone Exposed: No Treatment Notes Wound #1 (Left, Lateral Foot) 1. Cleanse With Wound Cleanser 2. Periwound Care Moisturizing lotion 3. Primary Dressing Applied Iodoflex 4. Secondary Dressing Dry Gauze 6. Support Layer Applied 3 layer compression wrap Notes netting Electronic Signature(s) Signed: 04/26/2020 5:57:05 PM By: [name] RN, BSN Entered By: [name] on 04/23/2020 13:30:50 -------------------------------------------------------------------------------- Wound Assessment Details Patient Name: Date of Service: Shawn Cornfield, [name] 04/23/2020 1:00 PM Medical Record Number: [id number] Patient Account Number: [id number] Date of Birth/Sex: Treating RN: 06-05-1946 (74 y.o. [name] Primary Care Riyan Haile:  [name] Other Clinician: Referring Nayelis Bonito: Treating Morgana Rowley/Extender: [name] in Treatment: 6 Wound Status Wound Number: 2 Primary Venous Leg Ulcer Etiology: Wound Location: Right, Anterior Lower Leg Wound Open Wounding Event: Trauma Status: Date Acquired: 04/12/2020 Comorbid Cataracts, Glaucoma, Anemia, Deep Vein Thrombosis, Weeks Of Treatment: 0 History: Hypertension, Peripheral Venous Disease, Osteoarthritis Clustered Wound: Yes Wound Measurements Length: (cm) 3.6 Width: (cm) 3.2 Depth: (cm) 0.1 Clustered Quantity: 3 Area: (cm) 9.048 Volume: (cm) 0.905 % Reduction in Area: % Reduction in Volume: Epithelialization: None Tunneling: No Undermining: No Wound Description Classification: Full Thickness Without Exposed Support Structures Wound Margin: Flat and Intact Exudate Amount: Medium Exudate Type: Serosanguineous Exudate Color: red, brown Foul  Odor After Cleansing: No Slough/Fibrino No Wound Bed Granulation Amount: Large (67-100%) Exposed Structure Granulation Quality: Red Fascia Exposed: No Necrotic Amount: None Present (0%) Fat Layer (Subcutaneous Tissue) Exposed: Yes Tendon Exposed: No Muscle Exposed: No Joint Exposed: No Bone Exposed: No Treatment Notes Wound #2 (Right, Anterior Lower Leg) 1. Cleanse With Wound Cleanser Soap and water 2. Periwound Care Moisturizing lotion 3. Primary Dressing Applied Calcium Alginate Ag 4. Secondary Dressing Dry Gauze 6. Support Layer Applied 3 layer compression wrap Notes netting Electronic Signature(s) Signed: 04/26/2020 5:57:05 PM By: [name] RN, BSN Entered By: [name] on 04/23/2020 13:32:00 -------------------------------------------------------------------------------- Vitals Details Patient Name: Date of Service: Shawn Cornfield, [name] 04/23/2020 1:00 PM Medical Record Number: [id number] Patient Account Number: [id number] Date of Birth/Sex: Treating  RN: 11/11/1945 (74 y.o. [name] Primary Care Sherryl Valido: [name] Other Clinician: Referring Roan Miklos: Treating Adamari Frede/Extender: [name] in Treatment: 6 Vital Signs Time Taken: 13:16 Temperature (F): 98.4 Height (in): 74 Pulse (bpm): 82 Weight (lbs): 260 Respiratory Rate (breaths/min): 18 Body Mass Index (BMI): 33.4 Blood Pressure (mmHg): 125/75 Reference Range: 80 - 120 mg / dl Electronic Signature(s) Signed: 04/26/2020 5:57:05 PM By: [name] RN, BSN Entered By: [name] on 04/23/2020 13:17:10

## 2020-04-26 NOTE — Progress Notes (Signed)
RILEN, SHUKLA ([id number]) Visit Report for 04/23/2020 Fall Risk Assessment Details Patient Name: Date of Service: [name] 04/23/2020 1:00 PM Medical Record Number: [id number] Patient Account Number: [id number] Date of Birth/Sex: Treating RN: 1945/11/21 (74 y.o. [name] Primary Care Solene Hereford: [name] Other Clinician: Referring Brenton Joines: Treating Duyen Beckom/Extender: [name] in Treatment: 6 Fall Risk Assessment Items Have you had 2 or more falls in the last 12 monthso 0 Yes Have you had any fall that resulted in injury in the last 12 monthso 0 Yes FALLS RISK SCREEN History of falling - immediate or within 3 months 25 Yes Secondary diagnosis (Do you have 2 or more medical diagnoseso) 15 Yes Ambulatory aid None/bed rest/wheelchair/nurse 0 No Crutches/cane/walker 15 Yes Furniture 0 No Intravenous therapy Access/Saline/Heparin Lock 0 No Gait/Transferring Normal/ bed rest/ wheelchair 0 Yes Weak (short steps with or without shuffle, stooped but able to lift head while walking, may seek 0 No support from furniture) Impaired (short steps with shuffle, may have difficulty arising from chair, head down, impaired 0 No balance) Mental Status Oriented to own ability 0 Yes Electronic Signature(s) Signed: 04/26/2020 5:57:05 PM By: [name] RN, BSN Entered By: [name] on 04/23/2020 13:32:20
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
## 2020-04-30 ENCOUNTER — Encounter (HOSPITAL_BASED_OUTPATIENT_CLINIC_OR_DEPARTMENT_OTHER): Payer: Medicare Other | Attending: Internal Medicine | Admitting: Internal Medicine

## 2020-04-30 ENCOUNTER — Other Ambulatory Visit: Payer: Self-pay

## 2020-04-30 DIAGNOSIS — L84 Corns and callosities: Secondary | ICD-10-CM | POA: Diagnosis not present

## 2020-04-30 DIAGNOSIS — T8131XD Disruption of external operation (surgical) wound, not elsewhere classified, subsequent encounter: Secondary | ICD-10-CM | POA: Diagnosis not present

## 2020-04-30 DIAGNOSIS — S80811D Abrasion, right lower leg, subsequent encounter: Secondary | ICD-10-CM | POA: Diagnosis not present

## 2020-04-30 DIAGNOSIS — L97811 Non-pressure chronic ulcer of other part of right lower leg limited to breakdown of skin: Secondary | ICD-10-CM | POA: Diagnosis not present

## 2020-04-30 DIAGNOSIS — L97522 Non-pressure chronic ulcer of other part of left foot with fat layer exposed: Secondary | ICD-10-CM | POA: Insufficient documentation

## 2020-04-30 DIAGNOSIS — I87332 Chronic venous hypertension (idiopathic) with ulcer and inflammation of left lower extremity: Secondary | ICD-10-CM | POA: Insufficient documentation

## 2020-04-30 DIAGNOSIS — I872 Venous insufficiency (chronic) (peripheral): Secondary | ICD-10-CM | POA: Diagnosis not present

## 2020-04-30 DIAGNOSIS — X58XXXD Exposure to other specified factors, subsequent encounter: Secondary | ICD-10-CM | POA: Diagnosis not present

## 2020-04-30 DIAGNOSIS — T8131XA Disruption of external operation (surgical) wound, not elsewhere classified, initial encounter: Secondary | ICD-10-CM | POA: Diagnosis not present
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
## 2020-05-03 NOTE — Progress Notes (Signed)
[name], [name] ([id number]) Visit Report for 04/30/2020 Arrival Information Details Patient Name: Date of Service: [name] 04/30/2020 2:30 PM Medical Record Number: [id number] Patient Account Number: [id number] Date of Birth/Sex: Treating RN: Nov 17, 1945 (74 y.o. [name] Carlene Coria Primary Care Marinus Eicher: [name] Other Clinician: Referring Cruzita Lipa: Treating Elizabethanne Lusher/Extender: [name] in Treatment: 7 Visit Information History Since Last Visit All ordered tests and consults were completed: [name] Patient Arrived: [name] Arrival Time: 14:56 Any new allergies or adverse reactions: [name] Accompanied By: self Had a fall or experienced change in [name] Transfer Assistance: None activities of daily living that may affect Patient Identification Verified: Yes risk of falls: Secondary Verification Process Completed: Yes Signs or symptoms of abuse/neglect since last visito [name] Patient Requires Transmission-Based Precautions: [name] Hospitalized since last visit: [name] Patient Has Alerts: [name] Implantable device outside of the clinic excluding [name] cellular tissue based products placed in the center since last visit: Has Dressing in Place as Prescribed: Yes Has Compression in Place as Prescribed: Yes Pain Present Now: [name] Electronic Signature(s) Signed: 04/30/2020 5:00:24 PM By: Carlene Coria RN Entered By: Carlene Coria on 04/30/2020 15:24:56 -------------------------------------------------------------------------------- Compression Therapy Details Patient Name: Date of Service: [name], Shawn BERT D. 04/30/2020 2:30 PM Medical Record Number: [id number] Patient Account Number: [id number] Date of Birth/Sex: Treating RN: 07-May-1946 (74 y.o. [name] Primary Care Glenola Wheat: [name] Other Clinician: Referring Dalonda Simoni: Treating Lelynd Poer/Extender: [name] in Treatment: 7 Compression Therapy  Performed for Wound Assessment: Wound #1 Left,Lateral Foot Performed By: Clinician [name], RN Compression Type: Three Layer Post Procedure Diagnosis Same as Pre-procedure Electronic Signature(s) Signed: 04/30/2020 6:06:29 PM By: Baruch Gouty RN, BSN Entered By: Baruch Gouty on 04/30/2020 15:45:46 -------------------------------------------------------------------------------- Compression Therapy Details Patient Name: Date of Service: [name], Shawn BERT D. 04/30/2020 2:30 PM Medical Record Number: [id number] Patient Account Number: [id number] Date of Birth/Sex: Treating RN: 10/11/1945 (74 y.o. [name] Primary Care Newell Frater: [name] Other Clinician: Referring Traveion Ruddock: Treating Konnar Ben/Extender: [name] in Treatment: 7 Compression Therapy Performed for Wound Assessment: Wound #2 Right,Anterior Lower Leg Performed By: Clinician [name], RN Compression Type: Three Layer Post Procedure Diagnosis Same as Pre-procedure Electronic Signature(s) Signed: 04/30/2020 6:06:29 PM By: Baruch Gouty RN, BSN Entered By: Baruch Gouty on 04/30/2020 15:45:46 -------------------------------------------------------------------------------- Encounter Discharge Information Details Patient Name: Date of Service: [name], Shawn BERT D. 04/30/2020 2:30 PM Medical Record Number: [id number] Patient Account Number: [id number] Date of Birth/Sex: Treating RN: 30-Jun-1945 (74 y.o. [name] Primary Care Justa Hatchell: [name] Other Clinician: Referring Brayla Pat: Treating Saamir Armstrong/Extender: [name] in Treatment: 7 Encounter Discharge Information Items Discharge Condition: Stable Ambulatory Status: Wheelchair Discharge Destination: Home Transportation: Private Auto Accompanied By: self Schedule Follow-up Appointment: Yes Clinical Summary of Care: Electronic Signature(s) Signed: 04/30/2020 5:41:13 PM By:  [name] Entered By: [name] on 04/30/2020 17:34:19 -------------------------------------------------------------------------------- Lower Extremity Assessment Details Patient Name: Date of Service: [name] 04/30/2020 2:30 PM Medical Record Number: [id number] Patient Account Number: [id number] Date of Birth/Sex: Treating RN: 28-Jul-1945 (74 y.o. [name] Carlene Coria Primary Care Coye Dawood: [name] Other Clinician: Referring Ariyel Jeangilles: Treating Zohair Epp/Extender: [name] in Treatment: 7 Edema Assessment Assessed: [name]: [name]] [Right: [name]] Edema: [Left: Yes] [Right: Yes] Calf Left: Right: Point of Measurement: From Medial Instep 40 cm 48 cm Ankle Left: Right: Point of Measurement: From Medial Instep 26 cm 30.5 cm Electronic  Signature(s) Signed: 04/30/2020 5:00:24 PM By: Carlene Coria RN Entered By: Carlene Coria on 04/30/2020 15:26:18 -------------------------------------------------------------------------------- Multi Wound Chart Details Patient Name: Date of Service: [name], Shawn BERT D. 04/30/2020 2:30 PM Medical Record Number: [id number] Patient Account Number: [id number] Date of Birth/Sex: Treating RN: 06/15/1946 (74 y.o. [name] Primary Care Delvis Kau: [name] Other Clinician: Referring Lowana Hable: Treating Senna Lape/Extender: [name] in Treatment: 7 Vital Signs Height(in): 35 Pulse(bpm): 31 Weight(lbs): 260 Blood Pressure(mmHg): 130/74 Body Mass Index(BMI): 33 Temperature(F): 98.6 Respiratory Rate(breaths/min): 18 Photos: [1:[name] Photos Left, Lateral Foot] [2:[name] Photos Right, Anterior Lower Leg] [N/A:N/A N/A] Wound Location: [1:Gradually Appeared] [2:Trauma] [N/A:N/A] Wounding Event: [1:Dehisced Wound] [2:Venous Leg Ulcer] [N/A:N/A] Primary Etiology: [1:Cataracts, Glaucoma, Anemia, Deep Cataracts, Glaucoma, Anemia, Deep N/A] Comorbid History: [1:Vein Thrombosis, Hypertension,  Peripheral Venous Disease, Osteoarthritis 02/03/2019] [2:Vein Thrombosis, Hypertension, Peripheral Venous Disease, Osteoarthritis 04/12/2020] [N/A:N/A] Date Acquired: [1:7] [2:1] [N/A:N/A] Weeks of Treatment: [1:Open] [2:Open] [N/A:N/A] Wound Status: [1:[name]] [2:Yes] [N/A:N/A] Clustered Wound: [1:N/A] [2:3] [N/A:N/A] Clustered Quantity: [1:1.6x0.6x0.4] [2:3x2.6x0.1] [N/A:N/A] Measurements L x W x D (cm) [1:0.754] [2:6.126] [N/A:N/A] A (cm) : rea [1:0.302] [7:0.263] [N/A:N/A] Volume (cm) : [1:64.50%] [2:32.30%] [N/A:N/A] % Reduction in Area: [1:52.50%] [2:32.30%] [N/A:N/A] % Reduction in Volume: [1:Full Thickness Without Exposed] [2:Full Thickness Without Exposed] [N/A:N/A] Classification: [1:Support Structures Medium] [2:Support Structures Medium] [N/A:N/A] Exudate Amount: [1:Serosanguineous] [2:Serosanguineous] [N/A:N/A] Exudate Type: [1:red, brown] [2:red, brown] [N/A:N/A] Exudate Color: [1:Well defined, not attached] [2:Flat and Intact] [N/A:N/A] Wound Margin: [1:Medium (34-66%)] [2:Large (67-100%)] [N/A:N/A] Granulation Amount: [1:Pink, Pale] [2:Red] [N/A:N/A] Granulation Quality: [1:Medium (34-66%)] [2:Small (1-33%)] [N/A:N/A] Necrotic Amount: [1:Fat Layer (Subcutaneous Tissue): Yes Fat Layer (Subcutaneous Tissue): Yes N/A] Exposed Structures: [1:Fascia: [name] Tendon: [name] Muscle: [name] Joint: [name] Bone: [name] Small (1-33%)] [2:Fascia: [name] Tendon: [name] Muscle: [name] Joint: [name] Bone: [name] None] [N/A:N/A] Epithelialization: [1:Compression Therapy] [2:Compression Therapy] [N/A:N/A] Procedures Performed: Treatment Notes Electronic Signature(s) Signed: 04/30/2020 6:06:29 PM By: Baruch Gouty RN, BSN Signed: 05/03/2020 1:46:42 PM By: Linton Ham MD Entered By: Linton Ham on 04/30/2020 16:12:02 -------------------------------------------------------------------------------- Multi-Disciplinary Care Plan Details Patient Name: Date of Service: [name], Shawn Bride D. 04/30/2020 2:30 PM Medical Record  Number: [id number] Patient Account Number: [id number] Date of Birth/Sex: Treating RN: [birth year] (74 y.o. [name] Primary Care Bain Whichard: [name] Other Clinician: Referring Almarosa Bohac: Treating Sheffield Hawker/Extender: [name] in Treatment: 7 Active Inactive Abuse / Safety / Falls / Self Care Management Nursing Diagnoses: History of Falls Potential for falls Goals: Patient/caregiver will verbalize/demonstrate measures taken to prevent injury and/or falls Date Initiated: 04/23/2020 Target Resolution Date: 05/21/2020 Goal Status: Active Interventions: Assess fall risk on admission and as needed Assess impairment of mobility on admission and as needed per policy Notes: Wound/Skin Impairment Nursing Diagnoses: Knowledge deficit related to ulceration/compromised skin integrity Goals: Patient/caregiver will verbalize understanding of skin care regimen Date Initiated: 03/09/2020 Target Resolution Date: 05/21/2020 Goal Status: Active Ulcer/skin breakdown will have a volume reduction of 30% by week 4 Date Initiated: 03/09/2020 Date Inactivated: 04/16/2020 Target Resolution Date: 05/28/2020 Goal Status: Unmet Unmet Reason: necrotic Ulcer/skin breakdown will have a volume reduction of 50% by week 8 Date Initiated: 04/16/2020 Target Resolution Date: 05/14/2020 Goal Status: Active Interventions: Assess patient/caregiver ability to obtain necessary supplies Assess patient/caregiver ability to perform ulcer/skin care regimen upon admission and as needed Assess ulceration(s) every visit Notes: Electronic Signature(s) Signed: 04/30/2020 6:06:29 PM By: Baruch Gouty RN, BSN Entered By: Baruch Gouty on 04/30/2020 15:44:55 -------------------------------------------------------------------------------- Pain Assessment Details Patient Name: Date of Service: [name], Shawn BERT  D. 04/30/2020 2:30 PM Medical Record Number: [id number] Patient Account  Number: [id number] Date of Birth/Sex: Treating RN: 01/17/1946 (74 y.o. [name] Carlene Coria Primary Care Lillie Portner: [name] Other Clinician: Referring Suellyn Meenan: Treating Myrissa Chipley/Extender: [name] in Treatment: 7 Active Problems Location of Pain Severity and Description of Pain Patient Has Paino [name] Site Locations Pain Management and Medication Current Pain Management: Electronic Signature(s) Signed: 04/30/2020 5:00:24 PM By: Carlene Coria RN Entered By: Carlene Coria on 04/30/2020 15:25:31 -------------------------------------------------------------------------------- Patient/Caregiver Education Details Patient Name: Date of Service: Shawn Boyer D. 11/5/2021andnbsp2:30 PM Medical Record Number: [id number] Patient Account Number: [id number] Date of Birth/Gender: Treating RN: 1946/03/04 (74 y.o. [name] Primary Care Physician: [name] Other Clinician: Referring Physician: Treating Physician/Extender: [name] in Treatment: 7 Education Assessment Education Provided To: Patient Education Topics Provided Venous: Methods: Explain/Verbal Responses: Reinforcements needed, State content correctly Wound/Skin Impairment: Methods: Explain/Verbal Responses: Reinforcements needed, State content correctly Electronic Signature(s) Signed: 04/30/2020 6:06:29 PM By: Baruch Gouty RN, BSN Entered By: Baruch Gouty on 04/30/2020 15:45:18 -------------------------------------------------------------------------------- Wound Assessment Details Patient Name: Date of Service: [name], Shawn BERT D. 04/30/2020 2:30 PM Medical Record Number: [id number] Patient Account Number: [id number] Date of Birth/Sex: Treating RN: 01-18-1946 (74 y.o. [name] Primary Care Julanne Schlueter: [name] Other Clinician: Referring Gail Vendetti: Treating Hadlei Stitt/Extender: [name] in Treatment: 7 Wound  Status Wound Number: 1 Primary Dehisced Wound Etiology: Wound Location: Left, Lateral Foot Wound Open Wounding Event: Gradually Appeared Status: Date Acquired: 02/03/2019 Comorbid Cataracts, Glaucoma, Anemia, Deep Vein Thrombosis, Weeks Of Treatment: 7 History: Hypertension, Peripheral Venous Disease, Osteoarthritis Clustered Wound: [name] Wound Measurements Length: (cm) 1.6 Width: (cm) 0.6 Depth: (cm) 0.4 Area: (cm) 0.754 Volume: (cm) 0.302 % Reduction in Area: 64.5% % Reduction in Volume: 52.5% Epithelialization: Small (1-33%) Tunneling: [name] Undermining: [name] Wound Description Classification: Full Thickness Without Exposed Support Structures Wound Margin: Well defined, not attached Exudate Amount: Medium Exudate Type: Serosanguineous Exudate Color: red, brown Foul Odor After Cleansing: [name] Slough/Fibrino Yes Wound Bed Granulation Amount: Medium (34-66%) Exposed Structure Granulation Quality: Pink, Pale Fascia Exposed: [name] Necrotic Amount: Medium (34-66%) Fat Layer (Subcutaneous Tissue) Exposed: Yes Necrotic Quality: Adherent Slough Tendon Exposed: [name] Muscle Exposed: [name] Joint Exposed: [name] Bone Exposed: [name] Treatment Notes Wound #1 (Left, Lateral Foot) 1. Cleanse With Wound Cleanser Soap and water 2. Periwound Care Moisturizing lotion 3. Primary Dressing Applied Iodoflex 4. Secondary Dressing Dry Gauze 6. Support Layer Applied 3 layer compression wrap Notes netting Electronic Signature(s) Signed: 04/30/2020 6:06:29 PM By: Baruch Gouty RN, BSN Entered By: Baruch Gouty on 04/30/2020 15:43:12 -------------------------------------------------------------------------------- Wound Assessment Details Patient Name: Date of Service: [name], Shawn BERT D. 04/30/2020 2:30 PM Medical Record Number: [id number] Patient Account Number: [id number] Date of Birth/Sex: Treating RN: 1946/04/02 (74 y.o. [name] Carlene Coria Primary Care Susana Gripp: [name] Other  Clinician: Referring Erlean Mealor: Treating Cortavius Montesinos/Extender: [name] in Treatment: 7 Wound Status Wound Number: 2 Primary Venous Leg Ulcer Etiology: Wound Location: Right, Anterior Lower Leg Wound Open Wounding Event: Trauma Status: Date Acquired: 04/12/2020 Comorbid Cataracts, Glaucoma, Anemia, Deep Vein Thrombosis, Weeks Of Treatment: 1 History: Hypertension, Peripheral Venous Disease, Osteoarthritis Clustered Wound: Yes Wound Measurements Length: (cm) 3 Width: (cm) 2.6 Depth: (cm) 0.1 Clustered Quantity: 3 Area: (cm) 6.126 Volume: (cm) 0.613 % Reduction in Area: 32.3% % Reduction in Volume: 32.3% Epithelialization: None Tunneling: [name] Undermining: [name] Wound Description Classification: Full Thickness Without Exposed Support Structures Wound Margin: Flat and Intact Exudate  Amount: Medium Exudate Type: Serosanguineous Exudate Color: red, brown Foul Odor After Cleansing: [name] Slough/Fibrino Yes Wound Bed Granulation Amount: Large (67-100%) Exposed Structure Granulation Quality: Red Fascia Exposed: [name] Necrotic Amount: Small (1-33%) Fat Layer (Subcutaneous Tissue) Exposed: Yes Necrotic Quality: Adherent Slough Tendon Exposed: [name] Muscle Exposed: [name] Joint Exposed: [name] Bone Exposed: [name] Treatment Notes Wound #2 (Right, Anterior Lower Leg) 1. Cleanse With Wound Cleanser Soap and water 2. Periwound Care Moisturizing lotion 3. Primary Dressing Applied Calcium Alginate Ag 4. Secondary Dressing Dry Gauze 6. Support Layer Applied 3 layer compression wrap Notes netting Electronic Signature(s) Signed: 04/30/2020 5:00:24 PM By: Carlene Coria RN Entered By: Carlene Coria on 04/30/2020 15:27:11 -------------------------------------------------------------------------------- Vitals Details Patient Name: Date of Service: [name], Shawn BERT D. 04/30/2020 2:30 PM Medical Record Number: [id number] Patient Account Number: [id number] Date of Birth/Sex:  Treating RN: 1946-04-16 (74 y.o. [name] Carlene Coria Primary Care Waldron Gerry: [name] Other Clinician: Referring Dekker Verga: Treating Leocadia Idleman/Extender: [name] in Treatment: 7 Vital Signs Time Taken: 15:25 Temperature (F): 98.6 Height (in): 74 Pulse (bpm): 87 Weight (lbs): 260 Respiratory Rate (breaths/min): 18 Body Mass Index (BMI): 33.4 Blood Pressure (mmHg): 130/74 Reference Range: 80 - 120 mg / dl Electronic Signature(s) Signed: 04/30/2020 5:00:24 PM By: Carlene Coria RN Entered By: Carlene Coria on 04/30/2020 15:25:24

## 2020-05-07 ENCOUNTER — Other Ambulatory Visit: Payer: Self-pay

## 2020-05-07 ENCOUNTER — Encounter (HOSPITAL_BASED_OUTPATIENT_CLINIC_OR_DEPARTMENT_OTHER): Payer: Medicare Other | Admitting: Internal Medicine

## 2020-05-07 DIAGNOSIS — I87332 Chronic venous hypertension (idiopathic) with ulcer and inflammation of left lower extremity: Secondary | ICD-10-CM | POA: Diagnosis not present

## 2020-05-07 DIAGNOSIS — L97811 Non-pressure chronic ulcer of other part of right lower leg limited to breakdown of skin: Secondary | ICD-10-CM | POA: Diagnosis not present

## 2020-05-07 DIAGNOSIS — T8131XA Disruption of external operation (surgical) wound, not elsewhere classified, initial encounter: Secondary | ICD-10-CM | POA: Diagnosis not present

## 2020-05-07 DIAGNOSIS — S80811D Abrasion, right lower leg, subsequent encounter: Secondary | ICD-10-CM | POA: Diagnosis not present

## 2020-05-07 DIAGNOSIS — L97522 Non-pressure chronic ulcer of other part of left foot with fat layer exposed: Secondary | ICD-10-CM | POA: Diagnosis not present

## 2020-05-07 DIAGNOSIS — I872 Venous insufficiency (chronic) (peripheral): Secondary | ICD-10-CM | POA: Diagnosis not present

## 2020-05-07 DIAGNOSIS — L84 Corns and callosities: Secondary | ICD-10-CM | POA: Diagnosis not present

## 2020-05-07 DIAGNOSIS — T8131XD Disruption of external operation (surgical) wound, not elsewhere classified, subsequent encounter: Secondary | ICD-10-CM | POA: Diagnosis not present

## 2020-05-10 NOTE — Progress Notes (Signed)
TYRE, BEAVER ([id number]) Visit Report for 05/07/2020 Arrival Information Details Patient Name: Date of Service: [name] 05/07/2020 2:15 PM Medical Record Number: [id number] Patient Account Number: [id number] Date of Birth/Sex: Treating RN: February 18, 1946 (74 y.o. [name] Primary Care [name]: [name] Other Clinician: Referring Hilman Kissling: Treating Bensyn Bornemann/Extender: [name] in Treatment: 8 Visit Information History Since Last Visit Added or deleted any medications: No Patient Arrived: [name] Any new allergies or adverse reactions: No Arrival Time: 14:52 Had a fall or experienced change in No Accompanied By: self activities of daily living that may affect Transfer Assistance: None risk of falls: Patient Identification Verified: Yes Signs or symptoms of abuse/neglect since last visito No Secondary Verification Process Completed: Yes Hospitalized since last visit: No Patient Requires Transmission-Based Precautions: No Implantable device outside of the clinic excluding No Patient Has Alerts: No cellular tissue based products placed in the center since last visit: Has Dressing in Place as Prescribed: Yes Pain Present Now: Yes Electronic Signature(s) Signed: 05/10/2020 1:12:05 PM By: Sandre Kitty Entered By: Sandre Kitty on 05/07/2020 14:52:22 -------------------------------------------------------------------------------- Compression Therapy Details Patient Name: Date of Service: [name], [name] 05/07/2020 2:15 PM Medical Record Number: [id number] Patient Account Number: [id number] Date of Birth/Sex: Treating RN: 28-Jan-1946 (74 y.o. [name] Primary Care Advik Weatherspoon: [name] Other Clinician: Referring Ranee Peasley: Treating Wilberth Damon/Extender: [name] in Treatment: 8 Compression Therapy Performed for Wound Assessment: Wound #1 Left,Lateral Foot Performed By: Clinician  [name], RN Compression Type: Three Layer Post Procedure Diagnosis Same as Pre-procedure Electronic Signature(s) Signed: 05/07/2020 5:58:22 PM By: Baruch Gouty RN, BSN Entered By: Baruch Gouty on 05/07/2020 15:26:33 -------------------------------------------------------------------------------- Encounter Discharge Information Details Patient Name: Date of Service: [name], [name] 05/07/2020 2:15 PM Medical Record Number: [id number] Patient Account Number: [id number] Date of Birth/Sex: Treating RN: [birth year] (74 y.o. [name] Primary Care Louna Rothgeb: [name] Other Clinician: Referring Sherrice Creekmore: Treating Krisi Azua/Extender: [name] in Treatment: 8 Encounter Discharge Information Items Discharge Condition: Stable Ambulatory Status: Walker Discharge Destination: Home Transportation: Private Auto Accompanied By: self Schedule Follow-up Appointment: Yes Clinical Summary of Care: Electronic Signature(s) Signed: 05/07/2020 5:09:42 PM By: [name] Entered By: [name] on 05/07/2020 17:07:18 -------------------------------------------------------------------------------- Lower Extremity Assessment Details Patient Name: Date of Service: [name], [name] 05/07/2020 2:15 PM Medical Record Number: [id number] Patient Account Number: [id number] Date of Birth/Sex: Treating RN: 1946/05/02 (74 y.o. [name] Primary Care Lyliana Dicenso: [name] Other Clinician: Referring Adonis Ryther: Treating Belia Febo/Extender: [name] in Treatment: 8 Edema Assessment Assessed: [name]: No] [name]: No] Edema: [Left: Yes] [Right: Yes] Calf Left: Right: Point of Measurement: From Medial Instep 40 cm 48 cm Ankle Left: Right: Point of Measurement: From Medial Instep 26 cm 30.5 cm Vascular Assessment Pulses: Dorsalis Pedis Palpable: [Left:Yes] [Right:Yes] Electronic Signature(s) Signed: 05/07/2020  5:58:22 PM By: Baruch Gouty RN, BSN Entered By: Baruch Gouty on 05/07/2020 15:24:36 -------------------------------------------------------------------------------- Multi Wound Chart Details Patient Name: Date of Service: [name], [name] 05/07/2020 2:15 PM Medical Record Number: [id number] Patient Account Number: [id number] Date of Birth/Sex: Treating RN: September 03, 1945 (74 y.o. [name] Primary Care Elfida Shimada: [name] Other Clinician: Referring Charnise Lovan: Treating Bree Heinzelman/Extender: [name] in Treatment: 8 Vital Signs Height(in): 60 Pulse(bpm): 52 Weight(lbs): 37 Blood Pressure(mmHg): 150/81 Body Mass Index(BMI): 33 Temperature(F): 98.7 Respiratory Rate(breaths/min): 18 Photos: [1:No Photos Left, Lateral Foot] [2:No Photos Right, Anterior Lower Leg] [N/A:N/A N/A] Wound  Location: [1:Gradually Appeared] [2:Trauma] [N/A:N/A] Wounding Event: [1:Dehisced Wound] [2:Venous Leg Ulcer] [N/A:N/A] Primary Etiology: [1:Cataracts, Glaucoma, Anemia, Deep Cataracts, Glaucoma, Anemia, Deep] [N/A:N/A] Comorbid History: [1:Vein Thrombosis, Hypertension, Peripheral Venous Disease, Osteoarthritis 02/03/2019] [2:Vein Thrombosis, Hypertension, Peripheral Venous Disease, Osteoarthritis 04/12/2020] [N/A:N/A] Date Acquired: [1:8] [2:2] [N/A:N/A] Weeks of Treatment: [1:Open] [2:Healed - Epithelialized] [N/A:N/A] Wound Status: [1:No] [2:Yes] [N/A:N/A] Clustered Wound: [1:N/A] [2:3] [N/A:N/A] Clustered Quantity: [1:1.4x0.7x0.4] [2:0x0x0] [N/A:N/A] Measurements L x W x D (cm) [1:0.77] [2:0] [N/A:N/A] A (cm) : rea [1:0.308] [2:0] [N/A:N/A] Volume (cm) : [1:63.70%] [2:100.00%] [N/A:N/A] % Reduction in Area: [1:51.60%] [2:100.00%] [N/A:N/A] % Reduction in Volume: [1:Full Thickness Without Exposed] [2:Full Thickness Without Exposed] [N/A:N/A] Classification: [1:Support Structures Medium] [2:Support Structures Small] [N/A:N/A] Exudate Amount:  [1:Serosanguineous] [2:Serous] [N/A:N/A] Exudate Type: [1:red, brown] [2:amber] [N/A:N/A] Exudate Color: [1:Distinct, outline attached] [2:Flat and Intact] [N/A:N/A] Wound Margin: [1:Large (67-100%)] [2:None Present (0%)] [N/A:N/A] Granulation Amount: [1:Pink, Pale] [2:N/A] [N/A:N/A] Granulation Quality: [1:Small (1-33%)] [2:None Present (0%)] [N/A:N/A] Necrotic Amount: [1:Fat Layer (Subcutaneous Tissue): Yes Fascia: No] [N/A:N/A] Exposed Structures: [1:Fascia: No Tendon: No Muscle: No Joint: No Bone: No Small (1-33%)] [2:Fat Layer (Subcutaneous Tissue): No Tendon: No Muscle: No Joint: No Bone: No Large (67-100%)] [N/A:N/A] Epithelialization: [1:Compression Therapy] [2:N/A] [N/A:N/A] Treatment Notes Electronic Signature(s) Signed: 05/07/2020 5:58:22 PM By: Baruch Gouty RN, BSN Signed: 05/10/2020 9:07:52 AM By: Linton Ham MD Entered By: Linton Ham on 05/07/2020 15:27:38 -------------------------------------------------------------------------------- Multi-Disciplinary Care Plan Details Patient Name: Date of Service: [name], Tobey Bride D. 05/07/2020 2:15 PM Medical Record Number: [id number] Patient Account Number: [id number] Date of Birth/Sex: Treating RN: Jan 05, 1946 (74 y.o. [name] Primary Care Brinley Rosete: [name] Other Clinician: Referring Cephus Tupy: Treating Laiken Sandy/Extender: [name] in Treatment: 8 Active Inactive Abuse / Safety / Falls / Self Care Management Nursing Diagnoses: History of Falls Potential for falls Goals: Patient/caregiver will verbalize/demonstrate measures taken to prevent injury and/or falls Date Initiated: 04/23/2020 Target Resolution Date: 05/21/2020 Goal Status: Active Interventions: Assess fall risk on admission and as needed Assess impairment of mobility on admission and as needed per policy Notes: Wound/Skin Impairment Nursing Diagnoses: Knowledge deficit related to ulceration/compromised  skin integrity Goals: Patient/caregiver will verbalize understanding of skin care regimen Date Initiated: 03/09/2020 Target Resolution Date: 05/21/2020 Goal Status: Active Ulcer/skin breakdown will have a volume reduction of 30% by week 4 Date Initiated: 03/09/2020 Date Inactivated: 04/16/2020 Target Resolution Date: 05/28/2020 Goal Status: Unmet Unmet Reason: necrotic Ulcer/skin breakdown will have a volume reduction of 50% by week 8 Date Initiated: 04/16/2020 Target Resolution Date: 05/14/2020 Goal Status: Active Interventions: Assess patient/caregiver ability to obtain necessary supplies Assess patient/caregiver ability to perform ulcer/skin care regimen upon admission and as needed Assess ulceration(s) every visit Notes: Electronic Signature(s) Signed: 05/07/2020 5:58:22 PM By: Baruch Gouty RN, BSN Entered By: Baruch Gouty on 05/07/2020 16:55:46 -------------------------------------------------------------------------------- Pain Assessment Details Patient Name: Date of Service: [name], [name] 05/07/2020 2:15 PM Medical Record Number: [id number] Patient Account Number: [id number] Date of Birth/Sex: Treating RN: 05-07-1946 (74 y.o. [name] Primary Care Heidemarie Goodnow: [name] Other Clinician: Referring Tilley Faeth: Treating Eyvette Cordon/Extender: [name] in Treatment: 8 Active Problems Location of Pain Severity and Description of Pain Patient Has Paino Yes Site Locations Rate the pain. Rate the pain. Current Pain Level: 7 Pain Management and Medication Current Pain Management: Electronic Signature(s) Signed: 05/07/2020 5:58:22 PM By: Baruch Gouty RN, BSN Signed: 05/10/2020 1:12:05 PM By: Sandre Kitty Entered By: Sandre Kitty on 05/07/2020 14:52:50 -------------------------------------------------------------------------------- Patient/Caregiver Education Details Patient Name: Date  of Service: [name] 11/12/2021andnbsp2:15 PM Medical Record Number: [id number] Patient Account Number: [id number] Date of Birth/Gender: Treating RN: 1945-08-10 (74 y.o. [name] Primary Care Physician: [name] Other Clinician: Referring Physician: Treating Physician/Extender: [name] in Treatment: 8 Education Assessment Education Provided To: Patient Education Topics Provided Venous: Methods: Explain/Verbal Responses: Reinforcements needed, State content correctly Wound/Skin Impairment: Methods: Explain/Verbal Responses: Reinforcements needed, State content correctly Electronic Signature(s) Signed: 05/07/2020 5:58:22 PM By: Baruch Gouty RN, BSN Entered By: Baruch Gouty on 05/07/2020 16:56:04 -------------------------------------------------------------------------------- Wound Assessment Details Patient Name: Date of Service: [name], [name] 05/07/2020 2:15 PM Medical Record Number: [id number] Patient Account Number: [id number] Date of Birth/Sex: Treating RN: [birth year] (74 y.o. [name] Primary Care Morena Mckissack: [name] Other Clinician: Referring Lacheryl Niesen: Treating Marquia Costello/Extender: [name] in Treatment: 8 Wound Status Wound Number: 1 Primary Dehisced Wound Etiology: Wound Location: Left, Lateral Foot Wound Open Wounding Event: Gradually Appeared Status: Date Acquired: 02/03/2019 Comorbid Cataracts, Glaucoma, Anemia, Deep Vein Thrombosis, Weeks Of Treatment: 8 History: Hypertension, Peripheral Venous Disease, Osteoarthritis Clustered Wound: No Wound Measurements Length: (cm) 1.4 Width: (cm) 0.7 Depth: (cm) 0.4 Area: (cm) 0.77 Volume: (cm) 0.308 % Reduction in Area: 63.7% % Reduction in Volume: 51.6% Epithelialization: Small (1-33%) Tunneling: No Undermining: No Wound Description Classification: Full Thickness Without Exposed Support Structures Wound Margin: Distinct, outline  attached Exudate Amount: Medium Exudate Type: Serosanguineous Exudate Color: red, brown Foul Odor After Cleansing: No Slough/Fibrino Yes Wound Bed Granulation Amount: Large (67-100%) Exposed Structure Granulation Quality: Pink, Pale Fascia Exposed: No Necrotic Amount: Small (1-33%) Fat Layer (Subcutaneous Tissue) Exposed: Yes Necrotic Quality: Adherent Slough Tendon Exposed: No Muscle Exposed: No Joint Exposed: No Bone Exposed: No Treatment Notes Wound #1 (Left, Lateral Foot) 1. Cleanse With Wound Cleanser Soap and water 2. Periwound Care Moisturizing lotion 3. Primary Dressing Applied Iodoflex 4. Secondary Dressing Dry Gauze 6. Support Layer Applied 3 layer compression wrap Notes netting Electronic Signature(s) Signed: 05/07/2020 5:58:22 PM By: Baruch Gouty RN, BSN Entered By: Baruch Gouty on 05/07/2020 15:23:42 -------------------------------------------------------------------------------- Wound Assessment Details Patient Name: Date of Service: [name], [name] 05/07/2020 2:15 PM Medical Record Number: [id number] Patient Account Number: [id number] Date of Birth/Sex: Treating RN: [birth year] (74 y.o. [name] Primary Care Madeleine Fenn: [name] Other Clinician: Referring Jalani Cullifer: Treating Brylan Dec/Extender: [name] in Treatment: 8 Wound Status Wound Number: 2 Primary Venous Leg Ulcer Etiology: Wound Location: Right, Anterior Lower Leg Wound Healed - Epithelialized Wounding Event: Trauma Status: Date Acquired: 04/12/2020 Comorbid Cataracts, Glaucoma, Anemia, Deep Vein Thrombosis, Weeks Of Treatment: 2 History: Hypertension, Peripheral Venous Disease, Osteoarthritis Clustered Wound: Yes Wound Measurements Length: (cm) Width: (cm) Depth: (cm) Clustered Quantity: Area: (cm) Volume: (cm) 0 % Reduction in Area: 100% 0 % Reduction in Volume: 100% 0 Epithelialization: Large (67-100%) 3 Tunneling: No 0  Undermining: No 0 Wound Description Classification: Full Thickness Without Exposed Support Structures Wound Margin: Flat and Intact Exudate Amount: Small Exudate Type: Serous Exudate Color: amber Foul Odor After Cleansing: No Slough/Fibrino Yes Wound Bed Granulation Amount: None Present (0%) Exposed Structure Necrotic Amount: None Present (0%) Fascia Exposed: No Fat Layer (Subcutaneous Tissue) Exposed: No Tendon Exposed: No Muscle Exposed: No Joint Exposed: No Bone Exposed: No Electronic Signature(s) Signed: 05/07/2020 5:58:22 PM By: Baruch Gouty RN, BSN Entered By: Baruch Gouty on 05/07/2020 15:23:21 -------------------------------------------------------------------------------- [place] Details Patient Name: Date of Service: [name], [name] 05/07/2020 2:15 PM Medical Record Number:  [id number] Patient Account Number: [id number] Date of Birth/Sex: Treating RN: 02/15/1946 (74 y.o. [name] Primary Care Fabion Gatson: [name] Other Clinician: Referring Kamin Niblack: Treating Sostenes Kauffmann/Extender: [name] in Treatment: 8 Vital Signs Time Taken: 14:52 Temperature (F): 98.7 Height (in): 74 Pulse (bpm): 83 Weight (lbs): 260 Respiratory Rate (breaths/min): 18 Body Mass Index (BMI): 33.4 Blood Pressure (mmHg): 150/81 Reference Range: 80 - 120 mg / dl Electronic Signature(s) Signed: 05/10/2020 1:12:05 PM By: Sandre Kitty Entered By: Sandre Kitty on 05/07/2020 14:52:39
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
## 2020-05-14 ENCOUNTER — Ambulatory Visit: Payer: Medicare Other | Admitting: Podiatry

## 2020-05-17 ENCOUNTER — Other Ambulatory Visit: Payer: Self-pay

## 2020-05-17 ENCOUNTER — Encounter (HOSPITAL_BASED_OUTPATIENT_CLINIC_OR_DEPARTMENT_OTHER): Payer: Medicare Other | Admitting: Physician Assistant

## 2020-05-17 DIAGNOSIS — I87332 Chronic venous hypertension (idiopathic) with ulcer and inflammation of left lower extremity: Secondary | ICD-10-CM | POA: Diagnosis not present

## 2020-05-17 DIAGNOSIS — L84 Corns and callosities: Secondary | ICD-10-CM | POA: Diagnosis not present

## 2020-05-17 DIAGNOSIS — I872 Venous insufficiency (chronic) (peripheral): Secondary | ICD-10-CM | POA: Diagnosis not present

## 2020-05-17 DIAGNOSIS — T8131XA Disruption of external operation (surgical) wound, not elsewhere classified, initial encounter: Secondary | ICD-10-CM | POA: Diagnosis not present

## 2020-05-17 DIAGNOSIS — L97522 Non-pressure chronic ulcer of other part of left foot with fat layer exposed: Secondary | ICD-10-CM | POA: Diagnosis not present

## 2020-05-17 DIAGNOSIS — T8131XD Disruption of external operation (surgical) wound, not elsewhere classified, subsequent encounter: Secondary | ICD-10-CM | POA: Diagnosis not present

## 2020-05-17 DIAGNOSIS — S80811D Abrasion, right lower leg, subsequent encounter: Secondary | ICD-10-CM | POA: Diagnosis not present

## 2020-05-17 DIAGNOSIS — L97811 Non-pressure chronic ulcer of other part of right lower leg limited to breakdown of skin: Secondary | ICD-10-CM | POA: Diagnosis not present
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
## 2020-05-17 NOTE — Progress Notes (Signed)
CARLON, CHALOUX ([id number]) Visit Report for 05/17/2020 Arrival Information Details Patient Name: Date of Service: [name] 05/17/2020 1:00 PM Medical Record Number: [id number] Patient Account Number: [id number] Date of Birth/Sex: Treating RN: [birth year] (74 y.o. [name] Primary Care Max Nuno: [name] Other Clinician: Referring Daden Mahany: Treating Murdock Jellison/Extender: Monico Hoar in Treatment: 9 Visit Information History Since Last Visit Added or deleted any medications: No Patient Arrived: [name] Any new allergies or adverse reactions: No Arrival Time: 13:28 Had a fall or experienced change in No Accompanied By: self activities of daily living that may affect Transfer Assistance: None risk of falls: Patient Identification Verified: Yes Signs or symptoms of abuse/neglect since last visito No Secondary Verification Process Completed: Yes Hospitalized since last visit: No Patient Requires Transmission-Based Precautions: No Implantable device outside of the clinic excluding No Patient Has Alerts: No cellular tissue based products placed in the center since last visit: Has Dressing in Place as Prescribed: Yes Has Compression in Place as Prescribed: Yes Pain Present Now: No Electronic Signature(s) Signed: 05/17/2020 5:18:41 PM By: Rhae Hammock RN Entered By: Rhae Hammock on 05/17/2020 13:36:45 -------------------------------------------------------------------------------- Compression Therapy Details Patient Name: Date of Service: [name], Shawn BERT D. 05/17/2020 1:00 PM Medical Record Number: [id number] Patient Account Number: [id number] Date of Birth/Sex: Treating RN: [birth year] (74 y.o. [name] Primary Care Jadda Hunsucker: [name] Other Clinician: Referring Xayne Brumbaugh: Treating Kadeem Hyle/Extender: Monico Hoar in Treatment: 9 Compression Therapy Performed for Wound Assessment: Wound  #1 Left,Lateral Foot Performed By: Clinician [name], RN Compression Type: Three Layer Post Procedure Diagnosis Same as Pre-procedure Electronic Signature(s) Signed: 05/17/2020 5:46:54 PM By: [name] RN, BSN Entered By: [name] on 05/17/2020 14:14:51 -------------------------------------------------------------------------------- Encounter Discharge Information Details Patient Name: Date of Service: [name], Shawn BERT D. 05/17/2020 1:00 PM Medical Record Number: [id number] Patient Account Number: [id number] Date of Birth/Sex: Treating RN: June 19, 1946 (74 y.o. [name] Primary Care Shadell Brenn: [name] Other Clinician: Referring Nidya Bouyer: Treating Fable Huisman/Extender: Monico Hoar in Treatment: 9 Encounter Discharge Information Items Discharge Condition: Stable Ambulatory Status: Walker Discharge Destination: Home Transportation: Private Auto Accompanied By: self Schedule Follow-up Appointment: Yes Clinical Summary of Care: Electronic Signature(s) Signed: 05/17/2020 4:33:25 PM By: [name] Entered By: [name] on 05/17/2020 14:20:08 -------------------------------------------------------------------------------- Lower Extremity Assessment Details Patient Name: Date of Service: [name], Shawn BERT D. 05/17/2020 1:00 PM Medical Record Number: [id number] Patient Account Number: [id number] Date of Birth/Sex: Treating RN: Oct 31, 1945 (74 y.o. [name] Primary Care Huck Ashworth: [name] Other Clinician: Referring Victorine Mcnee: Treating Tanette Chauca/Extender: Monico Hoar in Treatment: 9 Edema Assessment Assessed: [name]: Yes] [name]: No] Edema: [Left: Yes] [Right: Yes] Calf Left: Right: Point of Measurement: From Medial Instep 40 cm 48 cm Ankle Left: Right: Point of Measurement: From Medial Instep 24.5 cm 30.5 cm Vascular Assessment Pulses: Dorsalis Pedis Palpable:  [Left:Yes] Posterior Tibial Palpable: [Left:Yes] Electronic Signature(s) Signed: 05/17/2020 5:18:41 PM By: Rhae Hammock RN Signed: 05/17/2020 5:46:54 PM By: [name] RN, BSN Entered By: Rhae Hammock on 05/17/2020 13:33:10 -------------------------------------------------------------------------------- [place] Details Patient Name: Date of Service: [name], Shawn BERT D. 05/17/2020 1:00 PM Medical Record Number: [id number] Patient Account Number: [id number] Date of Birth/Sex: Treating RN: 1946/03/26 (74 y.o. [name] Primary Care Seraya Jobst: [name] Other Clinician: Referring Cyani Kallstrom: Treating Jame Morrell/Extender: Monico Hoar in Treatment: 9 Active Inactive Wound/Skin Impairment Nursing Diagnoses: Knowledge deficit related to ulceration/compromised  skin integrity Goals: Patient/caregiver will verbalize understanding of skin care regimen Date Initiated: 03/09/2020 Target Resolution Date: 06/18/2020 Goal Status: Active Ulcer/skin breakdown will have a volume reduction of 30% by week 4 Date Initiated: 03/09/2020 Date Inactivated: 04/16/2020 Target Resolution Date: 05/28/2020 Goal Status: Unmet Unmet Reason: necrotic Ulcer/skin breakdown will have a volume reduction of 50% by week 8 Date Initiated: 04/16/2020 Date Inactivated: 05/17/2020 Target Resolution Date: 05/14/2020 Goal Status: Met Interventions: Assess patient/caregiver ability to obtain necessary supplies Assess patient/caregiver ability to perform ulcer/skin care regimen upon admission and as needed Assess ulceration(s) every visit Notes: Electronic Signature(s) Signed: 05/17/2020 5:46:54 PM By: [name] RN, BSN Entered By: [name] on 05/17/2020 17:38:28 -------------------------------------------------------------------------------- Pain Assessment Details Patient Name: Date of Service: [name], Shawn BERT D. 05/17/2020 1:00  PM Medical Record Number: [id number] Patient Account Number: [id number] Date of Birth/Sex: Treating RN: Sep 08, 1945 (74 y.o. [name] Primary Care Heather Streeper: [name] Other Clinician: Referring Rynn Markiewicz: Treating Kayvan Hoefling/Extender: Monico Hoar in Treatment: 9 Active Problems Location of Pain Severity and Description of Pain Patient Has Paino No Site Locations Rate the pain. Rate the pain. Current Pain Level: 0 Pain Management and Medication Current Pain Management: Medication: No Cold Application: No Rest: No Massage: No Activity: No T.E.N.S.: No Heat Application: No Leg drop or elevation: No Is the Current Pain Management Adequate: Adequate How does your wound impact your activities of daily livingo Sleep: No Bathing: No Appetite: No Relationship With Others: No Bladder Continence: No Emotions: No Bowel Continence: No Work: No Toileting: No Drive: No Dressing: No Hobbies: No Electronic Signature(s) Signed: 05/17/2020 5:18:41 PM By: Rhae Hammock RN Signed: 05/17/2020 5:46:54 PM By: [name] RN, BSN Entered By: Rhae Hammock on 05/17/2020 13:37:41 -------------------------------------------------------------------------------- Patient/Caregiver Education Details Patient Name: Date of Service: [name], Shawn BERT D. 11/22/2021andnbsp1:00 PM Medical Record Number: [id number] Patient Account Number: [id number] Date of Birth/Gender: Treating RN: 1946-02-22 (74 y.o. [name] Primary Care Physician: [name] Other Clinician: Referring Physician: Treating Physician/Extender: Monico Hoar in Treatment: 9 Education Assessment Education Provided To: Patient Education Topics Provided Wound/Skin Impairment: Methods: Explain/Verbal Responses: State content correctly [employer]) Signed: 05/17/2020 5:46:54 PM By: [name] RN, BSN Entered By: [name] on  05/17/2020 17:38:37 -------------------------------------------------------------------------------- Wound Assessment Details Patient Name: Date of Service: [name], Shawn BERT D. 05/17/2020 1:00 PM Medical Record Number: [id number] Patient Account Number: [id number] Date of Birth/Sex: Treating RN: Oct 26, 1945 (74 y.o. [name] Primary Care Jirah Rider: [name] Other Clinician: Referring Lorely Bubb: Treating Natashia Roseman/Extender: Monico Hoar in Treatment: 9 Wound Status Wound Number: 1 Primary Dehisced Wound Etiology: Wound Location: Left, Lateral Foot Wound Open Wounding Event: Gradually Appeared Status: Date Acquired: 02/03/2019 Comorbid Cataracts, Glaucoma, Anemia, Deep Vein Thrombosis, Weeks Of Treatment: 9 History: Hypertension, Peripheral Venous Disease, Osteoarthritis Clustered Wound: No Wound Measurements Length: (cm) 1.3 Width: (cm) 1 Depth: (cm) 0.4 Area: (cm) 1.021 Volume: (cm) 0.408 % Reduction in Area: 51.9% % Reduction in Volume: 35.8% Epithelialization: Medium (34-66%) Tunneling: No Undermining: No Wound Description Classification: Full Thickness Without Exposed Support Structures Wound Margin: Distinct, outline attached Exudate Amount: Medium Exudate Type: Serosanguineous Exudate Color: red, brown Foul Odor After Cleansing: No Slough/Fibrino Yes Wound Bed Granulation Amount: Large (67-100%) Exposed Structure Granulation Quality: Pink, Pale Fascia Exposed: No Necrotic Amount: Small (1-33%) Fat Layer (Subcutaneous Tissue) Exposed: Yes Necrotic Quality: Adherent Slough Tendon Exposed: No Muscle Exposed: No Joint Exposed: No Bone Exposed: No Treatment Notes Wound #  1 (Left, Lateral Foot) 1. Cleanse With Wound Cleanser Soap and water 2. Periwound Care Moisturizing lotion 3. Primary Dressing Applied Iodoflex 4. Secondary Dressing Dry Gauze 6. Support Layer Applied 3 layer compression  wrap Notes stockinette. Electronic Signature(s) Signed: 05/17/2020 5:18:41 PM By: Rhae Hammock RN Signed: 05/17/2020 5:46:54 PM By: [name] RN, BSN Entered By: Rhae Hammock on 05/17/2020 13:39:00 -------------------------------------------------------------------------------- Vitals Details Patient Name: Date of Service: [name], Shawn BERT D. 05/17/2020 1:00 PM Medical Record Number: [id number] Patient Account Number: [id number] Date of Birth/Sex: Treating RN: 06-28-1945 (74 y.o. [name] Primary Care Blue Winther: [name] Other Clinician: Referring Jozalyn Baglio: Treating Laron Boorman/Extender: Monico Hoar in Treatment: 9 Vital Signs Time Taken: 13:28 Temperature (F): 97.7 Height (in): 74 Pulse (bpm): 83 Weight (lbs): 260 Respiratory Rate (breaths/min): 18 Body Mass Index (BMI): 33.4 Blood Pressure (mmHg): 126/80 Reference Range: 80 - 120 mg / dl Electronic Signature(s) Signed: 05/17/2020 5:18:41 PM By: Rhae Hammock RN Entered By: Rhae Hammock on 05/17/2020 13:37:32

## 2020-05-18 ENCOUNTER — Other Ambulatory Visit: Payer: Self-pay

## 2020-05-18 ENCOUNTER — Ambulatory Visit (INDEPENDENT_AMBULATORY_CARE_PROVIDER_SITE_OTHER): Payer: Medicare Other | Admitting: Internal Medicine

## 2020-05-18 ENCOUNTER — Encounter: Payer: Self-pay | Admitting: Internal Medicine

## 2020-05-18 VITALS — BP 126/65 | HR 78 | Temp 98.6°F | Ht 74.0 in | Wt 269.5 lb

## 2020-05-18 DIAGNOSIS — I10 Essential (primary) hypertension: Secondary | ICD-10-CM

## 2020-05-18 DIAGNOSIS — Z23 Encounter for immunization: Secondary | ICD-10-CM

## 2020-05-18 DIAGNOSIS — E8770 Fluid overload, unspecified: Secondary | ICD-10-CM

## 2020-05-18 DIAGNOSIS — I5032 Chronic diastolic (congestive) heart failure: Secondary | ICD-10-CM | POA: Diagnosis not present

## 2020-05-18 DIAGNOSIS — M1711 Unilateral primary osteoarthritis, right knee: Secondary | ICD-10-CM | POA: Diagnosis not present

## 2020-05-18 LAB — BASIC METABOLIC PANEL
Anion gap: 13 (ref 5–15)
BUN: 16 mg/dL (ref 8–23)
CO2: 25 mmol/L (ref 22–32)
Calcium: 9.7 mg/dL (ref 8.9–10.3)
Chloride: 101 mmol/L (ref 98–111)
Creatinine, Ser: 1.27 mg/dL — ABNORMAL HIGH (ref 0.61–1.24)
GFR, Estimated: 59 mL/min — ABNORMAL LOW (ref >60)
Glucose, Bld: 87 mg/dL (ref 70–99)
Potassium: 3.6 mmol/L (ref 3.5–5.1)
Sodium: 139 mmol/L (ref 135–145)

## 2020-05-18 LAB — BRAIN NATRIURETIC PEPTIDE: B Natriuretic Peptide: 730.5 pg/mL — ABNORMAL HIGH (ref 0.0–100.0)

## 2020-05-18 MED ORDER — FUROSEMIDE 40 MG PO TABS: 40.0000 mg | ORAL_TABLET | Freq: Every day | ORAL | 11 refills | Status: DC

## 2020-05-18 MED ORDER — OXYCODONE-ACETAMINOPHEN 10-325 MG PO TABS: 1.0000 | ORAL_TABLET | Freq: Three times a day (TID) | ORAL | 0 refills | Status: DC | PRN

## 2020-05-18 NOTE — Assessment & Plan Note (Addendum)
Patient presents today with generalized pain.  States he is out of his chronic pain medication of oxycodone.  States that his pain is mostly due to his multijoint arthritic pain, most significant in his right knee at this time.  States the pain regimen usually helps control his pain although it does not completely resolve the pain.  PDMP reviewed.  We will refill his pain medication today.  Recommend that he follow-up with his PCP to determine pain management going forward.  Plan: -Refill oxycodone-acetaminophen 10-325 every 8 hours as needed

## 2020-05-18 NOTE — Patient Instructions (Addendum)
[name],   It was a pleasure meeting you today. Today we discussed the following:   1. Pain- I have sent in a refill for your pain medication. I want you to follow up with Dr. Heber [place] at his next available appointment 2. Shortness of breath- I think this is related to your heart failure. I am checking some labs today to help [place] determine if we need to increase your lasix. I will give you a call with the results and to let you know if you need to increase your lasix. For now, continue taking lasix 40 mg daily.    Please call the internal medicine center clinic if you have any questions or concerns, we may be able to help and keep you from a long and expensive emergency room wait. Our clinic and after hours phone number is [phone], the best time to call is Monday through Friday 9 am to 4 pm but there is always someone available 24/7 if you have an emergency. If you need medication refills please notify your pharmacy one week in advance and they will send [place] a request.   Thank you for allowing [place] to be a part of your care!

## 2020-05-18 NOTE — Assessment & Plan Note (Signed)
Patient reports he has continued to have shortness of breath with exertion intermittently since he was last seen.  He is currently taking 40 mg of Lasix daily.  He states at rest he feels at his baseline.  He denies any chest pain, palpitations, cough, abdominal distention.  He states his leg swelling feels like his baseline as well.  On exam, he had 2+ pitting edema in bilateral lower extremities up to mid calf.  Lung exam was clear to auscultation.  No evidence of JVD.  Of note, patient's weight has been trending upwards with approximately 19 pound increase since July 2021.  Unclear if this is due to heart failure exacerbations or general weight gain.  Will obtain a BMP and BNP today to help determine if patient needs to increase dose of Lasix.   Plan: -Follow-up BNP and BMP -Continue Lasix 40 mg daily -Consider increasing Lasix pending results

## 2020-05-18 NOTE — Progress Notes (Signed)
CC: pain  HPI:  [name] is a 74 y.o. with a past medical history listed below presenting for pain due to osteoarthritis and shortness of breath. For details of today's visit and the status of his chronic medical issues please refer to the assessment and plan.   Past Medical History:  Diagnosis Date  . 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  Excellent symptomatic response to LES Botox injections.   . Anemia 08/20/2018  . AVM (arteriovenous malformation) of colon 08/26/2013   Cecum X 3, without bleeding on colonoscopy February 2015   . B12 deficiency 10/01/2011   Discovered to 2 progressive neurologic pain and dysfunction.  Diagnosis established March 2013.  Requires the following treatment: B12 IM monthly until level normalizes. After that, oral supplementation.   . Cataract   . Chronic venous insufficiency 11/28/2013  . Constipation due to opioid therapy 03/11/2015  . Deep venous thrombosis ([place]) 05/31/2018   Right lower extremity, unprovoked  . Diverticulosis 08/26/2013  . Essential hypertension 06/15/2006  . Family history of malignant neoplasm of gastrointestinal tract 06/30/2013   Father with colon cancer age 45   . Gastroesophageal reflux disease 02/04/2007  . GI bleeding 08/21/2018  . Internal hemorrhoids 08/26/2013  . Left posterior subcapsular cataract 10/03/2013   s/p yag laser capsulotomy 10/03/2013   . 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  Complicated by chronic low back pain and neuropathy.  Nerve conduction study (10/19/11): Diffusely low motor amplitudes, with primarily involvement of the peroneal and posterior tibial nerves. No evidence of generalized peripheral neuropathy. Findings could be c/w bil multilevel lumbosacral  radiculopathies or a primary motor neuropathy.   . Mild aortic valve stenosis 10/09/2006   Echo (12/09/2009): Valve area: 2.03 cm2   . Neuromuscular disorder ([place])   . Neuropathy   . Obesity, Class I, BMI 30-34.9 04/05/2012  . Onychomycosis of toenail 06/05/2014  . Osteoarthritis 11/25/2009   Right knee (medial compartment), right hip, left shoulder   . Peripheral neuropathy   . 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 Trigger little finger of right hand 03/11/2015   Review of Systems:    Review of Systems  Constitutional: Positive for malaise/fatigue. Negative for chills and fever.  Respiratory: Positive for shortness of breath. Negative for cough, sputum production and wheezing.   Cardiovascular: Positive for leg swelling. Negative for chest pain and palpitations.  Gastrointestinal: Negative for abdominal pain, nausea and vomiting.  Genitourinary: Negative for dysuria, frequency and urgency.  Musculoskeletal: Positive for back pain and joint pain.  Neurological: Negative for dizziness, weakness and headaches.     Physical Exam:  Vitals:   05/18/20 1443  Weight: 269 lb 8 oz (122.2 kg)   Physical Exam Vitals reviewed.  Constitutional:      General: He is not in acute distress.    Appearance: Normal appearance. He is not ill-appearing.  Cardiovascular:     Rate and Rhythm: Normal rate and regular rhythm.     Pulses: Normal pulses.     Heart sounds: Murmur heard.  No friction rub.  Pulmonary:     Effort: Pulmonary effort is normal. No respiratory distress.     Breath sounds: Normal breath sounds. No wheezing, rhonchi or rales.  Abdominal:  General: Abdomen is flat. Bowel sounds are normal. There is no distension.     Palpations: Abdomen is soft.     Tenderness: There is no abdominal tenderness. There is no guarding.  Musculoskeletal:        General: No swelling or tenderness.     Right lower leg: Edema present.      Left lower leg: Edema present.  Skin:    General: Skin is warm and dry.  Neurological:     General: No focal deficit present.     Mental Status: He is alert and oriented to person, place, and time.  Psychiatric:        Mood and Affect: Mood normal.        Behavior: Behavior normal.        Thought Content: Thought content normal.        Judgment: Judgment normal.     Assessment & Plan:   See Encounters Tab for problem based charting.  Patient discussed with Dr. Philipp Ovens

## 2020-05-19 LAB — MICROALBUMIN / CREATININE URINE RATIO
Creatinine, Urine: 55.9 mg/dL
Microalb/Creat Ratio: 169 mg/g creat — ABNORMAL HIGH (ref 0–29)
Microalbumin, Urine: 94.5 ug/mL

## 2020-05-24 NOTE — Progress Notes (Signed)
Internal Medicine Clinic Attending ° °Case discussed with Dr. Rehman  At the time of the visit.  We reviewed the resident’s history and exam and pertinent patient test results.  I agree with the assessment, diagnosis, and plan of care documented in the resident’s note.  ° °

## 2020-05-28 ENCOUNTER — Ambulatory Visit: Payer: Medicare Other | Admitting: Podiatry

## 2020-05-28 ENCOUNTER — Other Ambulatory Visit: Payer: Self-pay

## 2020-05-28 ENCOUNTER — Encounter (HOSPITAL_BASED_OUTPATIENT_CLINIC_OR_DEPARTMENT_OTHER): Payer: Medicare Other | Attending: Internal Medicine | Admitting: Internal Medicine

## 2020-05-28 DIAGNOSIS — L97811 Non-pressure chronic ulcer of other part of right lower leg limited to breakdown of skin: Secondary | ICD-10-CM | POA: Diagnosis not present

## 2020-05-28 DIAGNOSIS — L97522 Non-pressure chronic ulcer of other part of left foot with fat layer exposed: Secondary | ICD-10-CM | POA: Insufficient documentation

## 2020-05-28 DIAGNOSIS — S80811D Abrasion, right lower leg, subsequent encounter: Secondary | ICD-10-CM | POA: Diagnosis not present

## 2020-05-28 DIAGNOSIS — I87332 Chronic venous hypertension (idiopathic) with ulcer and inflammation of left lower extremity: Secondary | ICD-10-CM | POA: Insufficient documentation

## 2020-05-28 DIAGNOSIS — X58XXXD Exposure to other specified factors, subsequent encounter: Secondary | ICD-10-CM | POA: Diagnosis not present

## 2020-05-28 DIAGNOSIS — L84 Corns and callosities: Secondary | ICD-10-CM | POA: Diagnosis not present

## 2020-05-28 DIAGNOSIS — T8131XD Disruption of external operation (surgical) wound, not elsewhere classified, subsequent encounter: Secondary | ICD-10-CM | POA: Insufficient documentation
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
## 2020-05-31 NOTE — Progress Notes (Signed)
REUVEN, BRAVER ([id number]) Visit Report for 05/28/2020 Arrival Information Details Patient Name: Date of Service: [name] 05/28/2020 2:15 PM Medical Record Number: [id number] Patient Account Number: [id number] Date of Birth/Sex: Treating RN: 12/15/1945 (74 y.o. Burnadette Pop, Lauren Primary Care Arrayah Connors: [name] Other Clinician: Referring Philmore Lepore: Treating Conlee Sliter/Extender: [name] in Treatment: 11 Visit Information History Since Last Visit Added or deleted any medications: No Patient Arrived: [name] Any new allergies or adverse reactions: No Arrival Time: 14:52 Had a fall or experienced change in No Accompanied By: self activities of daily living that may affect Transfer Assistance: None risk of falls: Patient Identification Verified: Yes Signs or symptoms of abuse/neglect since last visito No Secondary Verification Process Completed: Yes Hospitalized since last visit: No Patient Requires Transmission-Based Precautions: No Implantable device outside of the clinic excluding No Patient Has Alerts: No cellular tissue based products placed in the center since last visit: Has Dressing in Place as Prescribed: Yes Pain Present Now: Yes Electronic Signature(s) Signed: 05/31/2020 5:19:12 PM By: Rhae Hammock RN Entered By: Rhae Hammock on 05/28/2020 14:53:43 -------------------------------------------------------------------------------- Compression Therapy Details Patient Name: Date of Service: [name], Shawn BERT D. 05/28/2020 2:15 PM Medical Record Number: [id number] Patient Account Number: [id number] Date of Birth/Sex: Treating RN: 1945-11-06 (74 y.o. [name] Primary Care Athziry Millican: [name] Other Clinician: Referring Allyse Fregeau: Treating Chakara Bognar/Extender: [name] in Treatment: 11 Compression Therapy Performed for Wound Assessment: Wound #1 Left,Lateral Foot Performed By:  Clinician [name], RN Compression Type: Three Layer Post Procedure Diagnosis Same as Pre-procedure Electronic Signature(s) Signed: 05/28/2020 5:01:53 PM By: Baruch Gouty RN, BSN Entered By: Baruch Gouty on 05/28/2020 15:11:58 -------------------------------------------------------------------------------- Lower Extremity Assessment Details Patient Name: Date of Service: [name], Shawn BERT D. 05/28/2020 2:15 PM Medical Record Number: [id number] Patient Account Number: [id number] Date of Birth/Sex: Treating RN: 24-Sep-1945 (74 y.o. Burnadette Pop, Lauren Primary Care Brailyn Killion: [name] Other Clinician: Referring Mikko Lewellen: Treating Zaccai Chavarin/Extender: [name] in Treatment: 11 Edema Assessment Assessed: [name]: No] [name]: No] Edema: [Left: Yes] [Right: Yes] Calf Left: Right: Point of Measurement: From Medial Instep 36 cm 48 cm Ankle Left: Right: Point of Measurement: From Medial Instep 25 cm 30.5 cm Knee To Floor Left: Right: From Medial Instep 45 cm 45 cm Vascular Assessment Pulses: Dorsalis Pedis Palpable: [Left:Yes] Posterior Tibial Palpable: [Left:Yes] Electronic Signature(s) Signed: 05/28/2020 5:01:53 PM By: Baruch Gouty RN, BSN Signed: 05/31/2020 5:19:12 PM By: Rhae Hammock RN Entered By: Baruch Gouty on 05/28/2020 16:56:09 -------------------------------------------------------------------------------- Multi Wound Chart Details Patient Name: Date of Service: [name], Shawn BERT D. 05/28/2020 2:15 PM Medical Record Number: [id number] Patient Account Number: [id number] Date of Birth/Sex: Treating RN: 17-Aug-1945 (74 y.o. [name] Primary Care Suda Forbess: [name] Other Clinician: Referring Windy Dudek: Treating Daran Favaro/Extender: [name] in Treatment: 11 Vital Signs Height(in): 47 Pulse(bpm): 21 Weight(lbs): 44 Blood Pressure(mmHg): 118/73 Body Mass Index(BMI):  33 Temperature(F): 98.6 Respiratory Rate(breaths/min): 18 Photos: [1:No Photos Left, Lateral Foot] [N/A:N/A N/A] Wound Location: [1:Gradually Appeared] [N/A:N/A] Wounding Event: [1:Dehisced Wound] [N/A:N/A] Primary Etiology: [1:Cataracts, Glaucoma, Anemia, Deep] [N/A:N/A] Comorbid History: [1:Vein Thrombosis, Hypertension, Peripheral Venous Disease, Osteoarthritis 02/03/2019] [N/A:N/A] Date Acquired: [1:11] [N/A:N/A] Weeks of Treatment: [1:Open] [N/A:N/A] Wound Status: [1:1x0.5x0.5] [N/A:N/A] Measurements L x W x D (cm) [1:0.393] [N/A:N/A] A (cm) : rea [1:0.196] [N/A:N/A] Volume (cm) : [1:81.50%] [N/A:N/A] % Reduction in Area: [1:69.20%] [N/A:N/A] % Reduction in Volume: [1:Full Thickness Without Exposed] [N/A:N/A] Classification: [1:Support Structures Medium] [N/A:N/A] Exudate  Amount: [1:Serosanguineous] [N/A:N/A] Exudate Type: [1:red, brown] [N/A:N/A] Exudate Color: [1:Distinct, outline attached] [N/A:N/A] Wound Margin: [1:Large (67-100%)] [N/A:N/A] Granulation Amount: [1:Pink, Pale] [N/A:N/A] Granulation Quality: [1:Small (1-33%)] [N/A:N/A] Necrotic Amount: [1:Fat Layer (Subcutaneous Tissue): Yes N/A] Exposed Structures: [1:Fascia: No Tendon: No Muscle: No Joint: No Bone: No Medium (34-66%)] [N/A:N/A] Epithelialization: [1:Compression Therapy] [N/A:N/A] Treatment Notes Electronic Signature(s) Signed: 05/28/2020 5:01:53 PM By: Baruch Gouty RN, BSN Signed: 05/28/2020 5:12:12 PM By: Linton Ham MD Entered By: Linton Ham on 05/28/2020 16:34:21 -------------------------------------------------------------------------------- Multi-Disciplinary Care Plan Details Patient Name: Date of Service: [name], Shawn Bride D. 05/28/2020 2:15 PM Medical Record Number: [id number] Patient Account Number: [id number] Date of Birth/Sex: Treating RN: Oct 24, 1945 (74 y.o. [name] Primary Care Tessla Spurling: [name] Other Clinician: Referring Caedin Mogan: Treating  Deyanira Fesler/Extender: [name] in Treatment: 11 Active Inactive Wound/Skin Impairment Nursing Diagnoses: Knowledge deficit related to ulceration/compromised skin integrity Goals: Patient/caregiver will verbalize understanding of skin care regimen Date Initiated: 03/09/2020 Target Resolution Date: 06/18/2020 Goal Status: Active Ulcer/skin breakdown will have a volume reduction of 30% by week 4 Date Initiated: 03/09/2020 Date Inactivated: 04/16/2020 Target Resolution Date: 05/28/2020 Goal Status: Unmet Unmet Reason: necrotic Ulcer/skin breakdown will have a volume reduction of 50% by week 8 Date Initiated: 04/16/2020 Date Inactivated: 05/17/2020 Target Resolution Date: 05/14/2020 Goal Status: Met Interventions: Assess patient/caregiver ability to obtain necessary supplies Assess patient/caregiver ability to perform ulcer/skin care regimen upon admission and as needed Assess ulceration(s) every visit Notes: Electronic Signature(s) Signed: 05/28/2020 5:01:53 PM By: Baruch Gouty RN, BSN Entered By: Baruch Gouty on 05/28/2020 15:11:07 -------------------------------------------------------------------------------- Pain Assessment Details Patient Name: Date of Service: [name], Shawn BERT D. 05/28/2020 2:15 PM Medical Record Number: [id number] Patient Account Number: [id number] Date of Birth/Sex: Treating RN: 24-May-1946 (74 y.o. [name] Primary Care Ringo Sherod: [name] Other Clinician: Referring Gevorg Brum: Treating Demorris Choyce/Extender: [name] in Treatment: 11 Active Problems Location of Pain Severity and Description of Pain Patient Has Paino Yes Site Locations Rate the pain. Current Pain Level: 5 Worst Pain Level: 10 Least Pain Level: 0 Tolerable Pain Level: 7 Character of Pain Describe the Pain: Aching Pain Management and Medication Current Pain Management: Medication: No Cold Application: No Rest:  Yes Massage: No Activity: No T.E.N.S.: No Heat Application: No Leg drop or elevation: No Is the Current Pain Management Adequate: Adequate How does your wound impact your activities of daily livingo Sleep: No Bathing: No Appetite: No Relationship With Others: No Bladder Continence: No Emotions: No Bowel Continence: No Work: No Toileting: No Drive: No Dressing: No Hobbies: No Electronic Signature(s) Signed: 05/31/2020 5:19:12 PM By: Rhae Hammock RN Entered By: Rhae Hammock on 05/28/2020 14:54:44 -------------------------------------------------------------------------------- Patient/Caregiver Education Details Patient Name: Date of Service: Shawn Boyer D. 12/3/2021andnbsp2:15 PM Medical Record Number: [id number] Patient Account Number: [id number] Date of Birth/Gender: Treating RN: 09-Aug-1945 (74 y.o. [name] Primary Care Physician: [name] Other Clinician: Referring Physician: Treating Physician/Extender: [name] in Treatment: 11 Education Assessment Education Provided To: Patient Education Topics Provided Venous: Methods: Explain/Verbal Responses: Reinforcements needed, State content correctly Wound/Skin Impairment: Methods: Explain/Verbal Responses: Reinforcements needed, State content correctly Electronic Signature(s) Signed: 05/28/2020 5:01:53 PM By: Baruch Gouty RN, BSN Entered By: Baruch Gouty on 05/28/2020 15:11:28 -------------------------------------------------------------------------------- Wound Assessment Details Patient Name: Date of Service: [name], Shawn BERT D. 05/28/2020 2:15 PM Medical Record Number: [id number] Patient Account Number: [id number] Date of Birth/Sex: Treating RN: Nov 21, 1945 (74 y.o. Burnadette Pop, Lauren Primary Care Jakim Drapeau: [name] Other  Clinician: Referring Treveon Bourcier: Treating Orel Hord/Extender: [name] in Treatment: 11 Wound  Status Wound Number: 1 Primary Dehisced Wound Etiology: Wound Location: Left, Lateral Foot Wound Open Wounding Event: Gradually Appeared Status: Date Acquired: 02/03/2019 Comorbid Cataracts, Glaucoma, Anemia, Deep Vein Thrombosis, Weeks Of Treatment: 11 History: Hypertension, Peripheral Venous Disease, Osteoarthritis Clustered Wound: No Wound Measurements Length: (cm) 1 Width: (cm) 0.5 Depth: (cm) 0.5 Area: (cm) 0.393 Volume: (cm) 0.196 % Reduction in Area: 81.5% % Reduction in Volume: 69.2% Epithelialization: Medium (34-66%) Tunneling: No Undermining: No Wound Description Classification: Full Thickness Without Exposed Support Structu Wound Margin: Distinct, outline attached Exudate Amount: Medium Exudate Type: Serosanguineous Exudate Color: red, brown Wound Bed Granulation Amount: Large (67-100%) Granulation Quality: Pink, Pale Necrotic Amount: Small (1-33%) Necrotic Quality: Adherent Slough res Foul Odor After Cleansing: No Slough/Fibrino Yes Exposed Structure Fascia Exposed: No Fat Layer (Subcutaneous Tissue) Exposed: Yes Tendon Exposed: No Muscle Exposed: No Joint Exposed: No Bone Exposed: No Electronic Signature(s) Signed: 05/31/2020 5:19:12 PM By: Rhae Hammock RN Entered By: Rhae Hammock on 05/28/2020 15:01:20 -------------------------------------------------------------------------------- [place] Details Patient Name: Date of Service: [name], Shawn BERT D. 05/28/2020 2:15 PM Medical Record Number: [id number] Patient Account Number: [id number] Date of Birth/Sex: Treating RN: 05/25/1946 (74 y.o. Burnadette Pop, Lauren Primary Care Derran Sear: [name] Other Clinician: Referring Alleen Kehm: Treating Aydien Majette/Extender: [name] in Treatment: 11 Vital Signs Time Taken: 14:53 Temperature (F): 98.6 Height (in): 74 Pulse (bpm): 96 Weight (lbs): 260 Respiratory Rate (breaths/min): 18 Body Mass Index (BMI): 33.4 Blood  Pressure (mmHg): 118/73 Reference Range: 80 - 120 mg / dl Electronic Signature(s) Signed: 05/31/2020 5:19:12 PM By: Rhae Hammock RN Entered By: Rhae Hammock on 05/28/2020 14:54:10

## 2020-06-01 ENCOUNTER — Ambulatory Visit (INDEPENDENT_AMBULATORY_CARE_PROVIDER_SITE_OTHER): Payer: Medicare Other | Admitting: Podiatry

## 2020-06-01 ENCOUNTER — Other Ambulatory Visit: Payer: Self-pay

## 2020-06-01 DIAGNOSIS — E1142 Type 2 diabetes mellitus with diabetic polyneuropathy: Secondary | ICD-10-CM

## 2020-06-01 DIAGNOSIS — B351 Tinea unguium: Secondary | ICD-10-CM | POA: Diagnosis not present

## 2020-06-01 DIAGNOSIS — L84 Corns and callosities: Secondary | ICD-10-CM | POA: Diagnosis not present

## 2020-06-01 NOTE — Progress Notes (Signed)
  Subjective:  Patient ID: [name], male    DOB: [birth year],  MRN: [id number]  Chief Complaint  Patient presents with  . Nail Problem    Nail trim 1-5 bilateral  . Callouses    Bilateral callous trim    74 y.o. male presents with the above complaint. History confirmed with patient.   Objective:  Physical Exam: warm, good capillary refill, nail exam onychomycosis of the toenails, no trophic changes or ulcerative lesions. DP pulses palpable, PT pulses palpable and protective sensation absent Left Foot: HPK  submet 1 Right Foot: HPK submet 5, 5th toe right  No images are attached to the encounter.  Assessment:   1. DM type 2 with diabetic peripheral neuropathy ([place])   2. Onychomycosis   3. Callus of foot       Plan:  Patient was evaluated and treated and all questions answered.  Onychomycosis, Diabetes and DPN -Patient is diabetic with a qualifying condition for at risk foot care.  Procedure: Nail Debridement Type of Debridement: manual, sharp debridement. Instrumentation: Nail nipper, rotary burr. Number of Nails: 10   Procedure: Paring of Lesion Rationale: painful hyperkeratotic lesion Type of Debridement: manual, sharp debridement. Instrumentation: 31 blade Number of Lesions: 3  No follow-ups on file.

## 2020-06-04 ENCOUNTER — Other Ambulatory Visit: Payer: Self-pay

## 2020-06-04 ENCOUNTER — Encounter (HOSPITAL_BASED_OUTPATIENT_CLINIC_OR_DEPARTMENT_OTHER): Payer: Medicare Other | Admitting: Internal Medicine

## 2020-06-04 DIAGNOSIS — T8131XD Disruption of external operation (surgical) wound, not elsewhere classified, subsequent encounter: Secondary | ICD-10-CM | POA: Diagnosis not present
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
## 2020-06-10 NOTE — Progress Notes (Signed)
TIERNAN, SUTO ([id number]) Visit Report for 06/04/2020 Arrival Information Details Patient Name: Date of Service: Jarvis Newcomer 06/04/2020 12:30 PM Medical Record Number: [id number] Patient Account Number: [id number] Date of Birth/Sex: Treating RN: 1945/08/04 (74 y.o. [name], [name] Primary Care Evalina Tabak: [name] Other Clinician: Referring Maylee Bare: Treating Eulah Walkup/Extender: [name] in Treatment: 12 Visit Information History Since Last Visit Added or deleted any medications: No Patient Arrived: [name] Any new allergies or adverse reactions: No Arrival Time: 12:57 Had a fall or experienced change in No Accompanied By: self activities of daily living that may affect Transfer Assistance: None risk of falls: Patient Identification Verified: Yes Signs or symptoms of abuse/neglect since last visito No Secondary Verification Process Completed: Yes Hospitalized since last visit: No Patient Requires Transmission-Based Precautions: No Implantable device outside of the clinic excluding No Patient Has Alerts: No cellular tissue based products placed in the center since last visit: Has Dressing in Place as Prescribed: Yes Has Compression in Place as Prescribed: Yes Pain Present Now: No Electronic Signature(s) Signed: 06/04/2020 5:15:15 PM By: Baruch Gouty RN, BSN Entered By: Baruch Gouty on 06/04/2020 12:57:46 -------------------------------------------------------------------------------- Compression Therapy Details Patient Name: Date of Service: [name], [name] 06/04/2020 12:30 PM Medical Record Number: [id number] Patient Account Number: [id number] Date of Birth/Sex: Treating RN: [birth year] (74 y.o. [name] Primary Care Kendryck Lacroix: [name] Other Clinician: Referring Ameah Chanda: Treating Reo Portela/Extender: [name] in Treatment: 12 Compression Therapy Performed for Wound Assessment:  Wound #1 Left,Lateral Foot Performed By: Clinician [name], RN Compression Type: Three Layer Post Procedure Diagnosis Same as Pre-procedure Electronic Signature(s) Signed: 06/04/2020 5:15:15 PM By: Baruch Gouty RN, BSN Entered By: Baruch Gouty on 06/04/2020 13:19:43 -------------------------------------------------------------------------------- Lower Extremity Assessment Details Patient Name: Date of Service: [name], [name] 06/04/2020 12:30 PM Medical Record Number: [id number] Patient Account Number: [id number] Date of Birth/Sex: Treating RN: 1945-07-05 (74 y.o. [name] Primary Care Cristine Daw: [name] Other Clinician: Referring Eswin Worrell: Treating Jenell Dobransky/Extender: [name] in Treatment: 12 Edema Assessment Assessed: [name]: No] [name]: No] Edema: [Left: Ye] [Right: s] Calf Left: Right: Point of Measurement: From Medial Instep 38.5 cm Ankle Left: Right: Point of Measurement: From Medial Instep 25.3 cm Vascular Assessment Pulses: Dorsalis Pedis Palpable: [Left:Yes] Electronic Signature(s) Signed: 06/04/2020 5:15:15 PM By: Baruch Gouty RN, BSN Entered By: Baruch Gouty on 06/04/2020 13:01:36 -------------------------------------------------------------------------------- Multi Wound Chart Details Patient Name: Date of Service: [name], [name] 06/04/2020 12:30 PM Medical Record Number: [id number] Patient Account Number: [id number] Date of Birth/Sex: Treating RN: July 14, 1945 (74 y.o. [name] Primary Care Naama Sappington: [name] Other Clinician: Referring Adelita Hone: Treating Keionte Swicegood/Extender: [name] in Treatment: 12 Vital Signs Height(in): 31 Pulse(bpm): 36 Weight(lbs): 87 Blood Pressure(mmHg): 126/73 Body Mass Index(BMI): 33 Temperature(F): 98.6 Respiratory Rate(breaths/min): 18 Photos: [1:No Photos Left, Lateral Foot] [N/A:N/A N/A] Wound Location:  [1:Gradually Appeared] [N/A:N/A] Wounding Event: [1:Dehisced Wound] [N/A:N/A] Primary Etiology: [1:Cataracts, Glaucoma, Anemia, Deep] [N/A:N/A] Comorbid History: [1:Vein Thrombosis, Hypertension, Peripheral Venous Disease, Osteoarthritis 02/03/2019] [N/A:N/A] Date Acquired: [1:12] [N/A:N/A] Weeks of Treatment: [1:Open] [N/A:N/A] Wound Status: [1:0.9x0.3x0.3] [N/A:N/A] Measurements L x W x D (cm) [1:0.212] [N/A:N/A] A (cm) : rea [1:0.064] [N/A:N/A] Volume (cm) : [1:90.00%] [N/A:N/A] % Reduction in Area: [1:89.90%] [N/A:N/A] % Reduction in Volume: [1:Full Thickness Without Exposed] [N/A:N/A] Classification: [1:Support Structures Medium] [N/A:N/A] Exudate Amount: [1:Serosanguineous] [N/A:N/A] Exudate Type: [1:red, brown] [N/A:N/A] Exudate Color: [1:Distinct, outline attached] [N/A:N/A] Wound Margin: [1:Large (67-100%)] [N/A:N/A] Granulation  Amount: [1:Pink, Pale] [N/A:N/A] Granulation Quality: [1:None Present (0%)] [N/A:N/A] Necrotic Amount: [1:Fat Layer (Subcutaneous Tissue): Yes N/A] Exposed Structures: [1:Fascia: No Tendon: No Muscle: No Joint: No Bone: No Large (67-100%)] [N/A:N/A] Epithelialization: [1:Compression Therapy] [N/A:N/A] Treatment Notes Electronic Signature(s) Signed: 06/04/2020 5:15:15 PM By: Baruch Gouty RN, BSN Signed: 06/10/2020 8:09:40 AM By: Linton Ham MD Entered By: Linton Ham on 06/04/2020 13:21:26 -------------------------------------------------------------------------------- Multi-Disciplinary Care Plan Details Patient Name: Date of Service: [name], Tobey Bride D. 06/04/2020 12:30 PM Medical Record Number: [id number] Patient Account Number: [id number] Date of Birth/Sex: Treating RN: [birth year] (74 y.o. [name] Primary Care Serenitie Vinton: [name] Other Clinician: Referring Kaitlynd Phillips: Treating Danelia Snodgrass/Extender: [name] in Treatment: 12 Active Inactive Venous Leg Ulcer Nursing Diagnoses: Knowledge  deficit related to disease process and management Potential for venous Insuffiency (use before diagnosis confirmed) Goals: Patient will maintain optimal edema control Date Initiated: 06/04/2020 Target Resolution Date: 07/02/2020 Goal Status: Active Interventions: Assess peripheral edema status every visit. Compression as ordered Treatment Activities: Therapeutic compression applied : 06/04/2020 Notes: Wound/Skin Impairment Nursing Diagnoses: Knowledge deficit related to ulceration/compromised skin integrity Goals: Patient/caregiver will verbalize understanding of skin care regimen Date Initiated: 03/09/2020 Target Resolution Date: 06/18/2020 Goal Status: Active Ulcer/skin breakdown will have a volume reduction of 30% by week 4 Date Initiated: 03/09/2020 Date Inactivated: 04/16/2020 Target Resolution Date: 05/28/2020 Goal Status: Unmet Unmet Reason: necrotic Ulcer/skin breakdown will have a volume reduction of 50% by week 8 Date Initiated: 04/16/2020 Date Inactivated: 05/17/2020 Target Resolution Date: 05/14/2020 Goal Status: Met Interventions: Assess patient/caregiver ability to obtain necessary supplies Assess patient/caregiver ability to perform ulcer/skin care regimen upon admission and as needed Assess ulceration(s) every visit Notes: Electronic Signature(s) Signed: 06/04/2020 5:15:15 PM By: Baruch Gouty RN, BSN Entered By: Baruch Gouty on 06/04/2020 13:23:04 -------------------------------------------------------------------------------- Pain Assessment Details Patient Name: Date of Service: [name], [name] 06/04/2020 12:30 PM Medical Record Number: [id number] Patient Account Number: [id number] Date of Birth/Sex: Treating RN: [birth year] (74 y.o. [name] Primary Care Rether Rison: [name] Other Clinician: Referring Dalexa Gentz: Treating Cyruss Arata/Extender: [name] in Treatment: 12 Active Problems Location of Pain  Severity and Description of Pain Patient Has Paino No Site Locations Rate the pain. Current Pain Level: 0 Character of Pain Describe the Pain: Tender Pain Management and Medication Current Pain Management: Is the Current Pain Management Adequate: Adequate How does your wound impact your activities of daily livingo Sleep: No Bathing: No Appetite: No Relationship With Others: No Bladder Continence: No Emotions: No Bowel Continence: No Work: No Toileting: No Drive: No Dressing: No Hobbies: No Electronic Signature(s) Signed: 06/04/2020 5:15:15 PM By: Baruch Gouty RN, BSN Entered By: Baruch Gouty on 06/04/2020 13:00:47 -------------------------------------------------------------------------------- Patient/Caregiver Education Details Patient Name: Date of Service: Posey Boyer D. 12/10/2021andnbsp12:30 PM Medical Record Number: [id number] Patient Account Number: [id number] Date of Birth/Gender: Treating RN: [birth year] (74 y.o. [name] Primary Care Physician: [name] Other Clinician: Referring Physician: Treating Physician/Extender: [name] in Treatment: 12 Education Assessment Education Provided To: Patient Education Topics Provided Venous: Methods: Explain/Verbal Responses: Reinforcements needed, State content correctly Wound/Skin Impairment: Methods: Explain/Verbal Responses: Reinforcements needed, State content correctly Electronic Signature(s) Signed: 06/04/2020 5:15:15 PM By: Baruch Gouty RN, BSN Entered By: Baruch Gouty on 06/04/2020 13:23:23 -------------------------------------------------------------------------------- Wound Assessment Details Patient Name: Date of Service: [name], [name] 06/04/2020 12:30 PM Medical Record Number: [id number] Patient Account Number: [id number] Date of Birth/Sex: Treating RN: April 10, 1946 (74 y.o. [name] Primary  Care Tavarious Freel: [name] Other Clinician: Referring Leya Paige: Treating Rasheka Denard/Extender: [name] in Treatment: 12 Wound Status Wound Number: 1 Primary Dehisced Wound Etiology: Wound Location: Left, Lateral Foot Wound Open Wounding Event: Gradually Appeared Status: Date Acquired: 02/03/2019 Comorbid Cataracts, Glaucoma, Anemia, Deep Vein Thrombosis, Weeks Of Treatment: 12 History: Hypertension, Peripheral Venous Disease, Osteoarthritis Clustered Wound: No Photos Photo Uploaded By: Mikeal Hawthorne on 06/09/2020 14:18:56 Wound Measurements Length: (cm) 0.9 Width: (cm) 0.3 Depth: (cm) 0.3 Area: (cm) 0.212 Volume: (cm) 0.064 % Reduction in Area: 90% % Reduction in Volume: 89.9% Epithelialization: Large (67-100%) Tunneling: No Undermining: No Wound Description Classification: Full Thickness Without Exposed Support Structures Wound Margin: Distinct, outline attached Exudate Amount: Medium Exudate Type: Serosanguineous Exudate Color: red, brown Foul Odor After Cleansing: No Slough/Fibrino No Wound Bed Granulation Amount: Large (67-100%) Exposed Structure Granulation Quality: Pink, Pale Fascia Exposed: No Necrotic Amount: None Present (0%) Fat Layer (Subcutaneous Tissue) Exposed: Yes Tendon Exposed: No Muscle Exposed: No Joint Exposed: No Bone Exposed: No Electronic Signature(s) Signed: 06/04/2020 5:15:15 PM By: Baruch Gouty RN, BSN Entered By: Baruch Gouty on 06/04/2020 13:02:57 -------------------------------------------------------------------------------- [place] Details Patient Name: Date of Service: [name], [name] 06/04/2020 12:30 PM Medical Record Number: [id number] Patient Account Number: [id number] Date of Birth/Sex: Treating RN: [birth year] (74 y.o. [name] Primary Care Jadynn Epping: [name] Other Clinician: Referring Wesam Gearhart: Treating Brooklinn Longbottom/Extender: [name] in Treatment: 12 Vital  Signs Time Taken: 12:57 Temperature (F): 98.6 Height (in): 74 Pulse (bpm): 69 Source: Stated Respiratory Rate (breaths/min): 18 Weight (lbs): 260 Blood Pressure (mmHg): 126/73 Source: Stated Reference Range: 80 - 120 mg / dl Body Mass Index (BMI): 33.4 Electronic Signature(s) Signed: 06/04/2020 5:15:15 PM By: Baruch Gouty RN, BSN Entered By: Baruch Gouty on 06/04/2020 12:58:10

## 2020-06-11 ENCOUNTER — Encounter (HOSPITAL_BASED_OUTPATIENT_CLINIC_OR_DEPARTMENT_OTHER): Payer: Medicare Other | Admitting: Internal Medicine

## 2020-06-11 ENCOUNTER — Other Ambulatory Visit: Payer: Self-pay

## 2020-06-11 DIAGNOSIS — T8131XD Disruption of external operation (surgical) wound, not elsewhere classified, subsequent encounter: Secondary | ICD-10-CM | POA: Diagnosis not present

## 2020-06-11 NOTE — Progress Notes (Signed)
OCEAN, SCHILDT ([id number]) Visit Report for 06/11/2020 Arrival Information Details Patient Name: Date of Service: [name] 06/11/2020 12:45 PM Medical Record Number: [id number] Patient Account Number: [id number] Date of Birth/Sex: Treating RN: 06/09/1946 (74 y.o. Jerilynn Mages) Carlene Coria Primary Care Blue Winther: [name] Other Clinician: Referring Keonna Raether: Treating Kahliyah Dick/Extender: [name] in Treatment: 28 Visit Information History Since Last Visit All ordered tests and consults were completed: No Patient Arrived: [name] Added or deleted any medications: No Arrival Time: 13:21 Any new allergies or adverse reactions: No Accompanied By: self Had a fall or experienced change in No Transfer Assistance: None activities of daily living that may affect Patient Identification Verified: Yes risk of falls: Secondary Verification Process Completed: Yes Signs or symptoms of abuse/neglect since last visito No Patient Requires Transmission-Based Precautions: No Hospitalized since last visit: No Patient Has Alerts: No Implantable device outside of the clinic excluding No cellular tissue based products placed in the center since last visit: Has Dressing in Place as Prescribed: Yes Has Compression in Place as Prescribed: Yes Pain Present Now: No Electronic Signature(s) Signed: 06/11/2020 5:23:39 PM By: Carlene Coria RN Entered By: Carlene Coria on 06/11/2020 13:21:48 -------------------------------------------------------------------------------- Compression Therapy Details Patient Name: Date of Service: [name], Shawn BERT D. 06/11/2020 12:45 PM Medical Record Number: [id number] Patient Account Number: [id number] Date of Birth/Sex: Treating RN: [birth year] (74 y.o. [name] Primary Care Marc Sivertsen: [name] Other Clinician: Referring Asael Pann: Treating Vlad Mayberry/Extender: [name] in Treatment: 13 Compression  Therapy Performed for Wound Assessment: Wound #1 Left,Lateral Foot Performed By: Clinician [name], RN Compression Type: Three Layer Post Procedure Diagnosis Same as Pre-procedure Electronic Signature(s) Signed: 06/11/2020 5:46:53 PM By: [name] RN, BSN Entered By: [name] on 06/11/2020 13:43:17 -------------------------------------------------------------------------------- Encounter Discharge Information Details Patient Name: Date of Service: [name], Shawn BERT D. 06/11/2020 12:45 PM Medical Record Number: [id number] Patient Account Number: [id number] Date of Birth/Sex: Treating RN: 1946/01/04 (74 y.o. [name] Primary Care Edmund Holcomb: [name] Other Clinician: Referring Aleesa Sweigert: Treating Delrick Dehart/Extender: [name] in Treatment: 13 Encounter Discharge Information Items Discharge Condition: Stable Ambulatory Status: Walker Discharge Destination: Home Transportation: Private Auto Accompanied By: self Schedule Follow-up Appointment: Yes Clinical Summary of Care: Electronic Signature(s) Signed: 06/11/2020 5:32:58 PM By: [name] Entered By: [name] on 06/11/2020 16:27:45 -------------------------------------------------------------------------------- Lower Extremity Assessment Details Patient Name: Date of Service: [name] 06/11/2020 12:45 PM Medical Record Number: [id number] Patient Account Number: [id number] Date of Birth/Sex: Treating RN: 01/10/1946 (74 y.o. Jerilynn Mages) Carlene Coria Primary Care Javoris Star: [name] Other Clinician: Referring Lori-Ann Lindfors: Treating Mandie Crabbe/Extender: [name] in Treatment: 13 Edema Assessment Assessed: [name]: No] [name]: No] Edema: [Left: Ye] [Right: s] Calf Left: Right: Point of Measurement: From Medial Instep 38 cm Ankle Left: Right: Point of Measurement: From Medial Instep 25 cm Electronic Signature(s) Signed: 06/11/2020  5:23:39 PM By: Carlene Coria RN Entered By: Carlene Coria on 06/11/2020 13:22:34 -------------------------------------------------------------------------------- Multi Wound Chart Details Patient Name: Date of Service: [name], Shawn BERT D. 06/11/2020 12:45 PM Medical Record Number: [id number] Patient Account Number: [id number] Date of Birth/Sex: Treating RN: 1945/09/27 (74 y.o. [name] Primary Care Bethany Hirt: [name] Other Clinician: Referring Evyn Kooyman: Treating Ridge Lafond/Extender: [name] in Treatment: 13 Vital Signs Height(in): 74 Pulse(bpm): 73 Weight(lbs): 260 Blood Pressure(mmHg): 118/73 Body Mass Index(BMI): 33 Temperature(F): 98.4 Respiratory Rate(breaths/min): 18 Photos: [1:No Photos Left, Lateral Foot] [N/A:N/A N/A] Wound Location: [1:Gradually Appeared] [  N/A:N/A] Wounding Event: [1:Dehisced Wound] [N/A:N/A] Primary Etiology: [1:Cataracts, Glaucoma, Anemia, Deep N/A] Comorbid History: [1:Vein Thrombosis, Hypertension, Peripheral Venous Disease, Osteoarthritis 02/03/2019] [N/A:N/A] Date Acquired: [1:13] [N/A:N/A] Weeks of Treatment: [1:Open] [N/A:N/A] Wound Status: [1:0.9x0.4x0.3] [N/A:N/A] Measurements L x W x D (cm) [1:0.283] [N/A:N/A] A (cm) : rea [1:0.085] [N/A:N/A] Volume (cm) : [1:86.70%] [N/A:N/A] % Reduction in Area: [1:86.60%] [N/A:N/A] % Reduction in Volume: [1:Full Thickness Without Exposed] [N/A:N/A] Classification: [1:Support Structures Medium] [N/A:N/A] Exudate Amount: [1:Serosanguineous] [N/A:N/A] Exudate Type: [1:red, brown] [N/A:N/A] Exudate Color: [1:Distinct, outline attached] [N/A:N/A] Wound Margin: [1:Large (67-100%)] [N/A:N/A] Granulation Amount: [1:Pink, Pale] [N/A:N/A] Granulation Quality: [1:None Present (0%)] [N/A:N/A] Necrotic Amount: [1:Fat Layer (Subcutaneous Tissue): Yes N/A] Exposed Structures: [1:Fascia: No Tendon: No Muscle: No Joint: No Bone: No Large (67-100%)] [N/A:N/A] Treatment  Notes Electronic Signature(s) Signed: 06/11/2020 4:58:57 PM By: Linton Ham MD Signed: 06/11/2020 5:46:53 PM By: [name] RN, BSN Entered By: Linton Ham on 06/11/2020 13:42:14 -------------------------------------------------------------------------------- Multi-Disciplinary Care Plan Details Patient Name: Date of Service: [name], Tobey Bride D. 06/11/2020 12:45 PM Medical Record Number: [id number] Patient Account Number: [id number] Date of Birth/Sex: Treating RN: [birth year] (74 y.o. [name] Primary Care Hollee Fate: [name] Other Clinician: Referring Hether Anselmo: Treating Kura Bethards/Extender: [name] in Treatment: 13 Active Inactive Venous Leg Ulcer Nursing Diagnoses: Knowledge deficit related to disease process and management Potential for venous Insuffiency (use before diagnosis confirmed) Goals: Patient will maintain optimal edema control Date Initiated: 06/04/2020 Target Resolution Date: 07/02/2020 Goal Status: Active Interventions: Assess peripheral edema status every visit. Compression as ordered Treatment Activities: Therapeutic compression applied : 06/04/2020 Notes: Wound/Skin Impairment Nursing Diagnoses: Knowledge deficit related to ulceration/compromised skin integrity Goals: Patient/caregiver will verbalize understanding of skin care regimen Date Initiated: 03/09/2020 Target Resolution Date: 06/18/2020 Goal Status: Active Ulcer/skin breakdown will have a volume reduction of 30% by week 4 Date Initiated: 03/09/2020 Date Inactivated: 04/16/2020 Target Resolution Date: 05/28/2020 Goal Status: Unmet Unmet Reason: necrotic Ulcer/skin breakdown will have a volume reduction of 50% by week 8 Date Initiated: 04/16/2020 Date Inactivated: 05/17/2020 Target Resolution Date: 05/14/2020 Goal Status: Met Interventions: Assess patient/caregiver ability to obtain necessary supplies Assess patient/caregiver ability to  perform ulcer/skin care regimen upon admission and as needed Assess ulceration(s) every visit Notes: Electronic Signature(s) Signed: 06/11/2020 5:46:53 PM By: [name] RN, BSN Entered By: [name] on 06/11/2020 17:36:48 -------------------------------------------------------------------------------- Pain Assessment Details Patient Name: Date of Service: [name], Shawn BERT D. 06/11/2020 12:45 PM Medical Record Number: [id number] Patient Account Number: [id number] Date of Birth/Sex: Treating RN: 1945-12-28 (74 y.o. [name] Primary Care Moua Rasmusson: [name] Other Clinician: Referring Kaiden Pech: Treating Rima Blizzard/Extender: [name] in Treatment: 13 Active Problems Location of Pain Severity and Description of Pain Patient Has Paino No Site Locations Pain Management and Medication Current Pain Management: Electronic Signature(s) Signed: 06/11/2020 5:23:39 PM By: Carlene Coria RN Entered By: Carlene Coria on 06/11/2020 13:22:24 -------------------------------------------------------------------------------- Patient/Caregiver Education Details Patient Name: Date of Service: Posey Boyer D. 12/17/2021andnbsp12:45 PM Medical Record Number: [id number] Patient Account Number: [id number] Date of Birth/Gender: Treating RN: [birth year] (74 y.o. [name] Primary Care Physician: [name] Other Clinician: Referring Physician: Treating Physician/Extender: [name] in Treatment: 13 Education Assessment Education Provided To: Patient Education Topics Provided Wound/Skin Impairment: Methods: Explain/Verbal Responses: State content correctly [employer]) Signed: 06/11/2020 5:46:53 PM By: [name] RN, BSN Entered By: [name] on 06/11/2020 17:36:57 -------------------------------------------------------------------------------- Wound Assessment Details Patient Name: Date of  Service: [name], Shawn BERT  D. 06/11/2020 12:45 PM Medical Record Number: [id number] Patient Account Number: [id number] Date of Birth/Sex: Treating RN: 10-Sep-1945 (74 y.o. Jerilynn Mages) Carlene Coria Primary Care Laketha Leopard: [name] Other Clinician: Referring Nyia Tsao: Treating Noralee Dutko/Extender: [name] in Treatment: 13 Wound Status Wound Number: 1 Primary Dehisced Wound Etiology: Wound Location: Left, Lateral Foot Wound Open Wounding Event: Gradually Appeared Status: Date Acquired: 02/03/2019 Comorbid Cataracts, Glaucoma, Anemia, Deep Vein Thrombosis, Weeks Of Treatment: 13 History: Hypertension, Peripheral Venous Disease, Osteoarthritis Clustered Wound: No Wound Measurements Length: (cm) 0.9 Width: (cm) 0.4 Depth: (cm) 0.3 Area: (cm) 0.283 Volume: (cm) 0.085 % Reduction in Area: 86.7% % Reduction in Volume: 86.6% Epithelialization: Large (67-100%) Tunneling: No Undermining: No Wound Description Classification: Full Thickness Without Exposed Support Structures Wound Margin: Distinct, outline attached Exudate Amount: Medium Exudate Type: Serosanguineous Exudate Color: red, brown Foul Odor After Cleansing: No Slough/Fibrino No Wound Bed Granulation Amount: Large (67-100%) Exposed Structure Granulation Quality: Pink, Pale Fascia Exposed: No Necrotic Amount: None Present (0%) Fat Layer (Subcutaneous Tissue) Exposed: Yes Tendon Exposed: No Muscle Exposed: No Joint Exposed: No Bone Exposed: No Treatment Notes Wound #1 (Foot) Wound Laterality: Left, Lateral Cleanser Peri-Wound Care Sween Lotion (Moisturizing lotion) Discharge Instruction: Apply moisturizing lotion as directed Topical Primary Dressing Endoform 2x2 in Discharge Instruction: Moisten with saline Secondary Dressing Woven Gauze Sponge, Non-Sterile 4x4 in Discharge Instruction: Apply over primary dressing as directed. Secured With Compression Wrap ThreePress (3 layer  compression wrap) Discharge Instruction: Apply three layer compression as directed. Compression Stockings Carolon Multi Layer Compression Stocking Quantity: 1 Left Leg Compression Amount: 30-40 mmHg Right Leg Compression Amount: 30-40 mmHg Discharge Instruction: Apply compression stocking daily as instructed. Apply first thing in the morning, remove at night before bed. Add-Ons Electronic Signature(s) Signed: 06/11/2020 5:23:39 PM By: Carlene Coria RN Entered By: Carlene Coria on 06/11/2020 13:23:12 -------------------------------------------------------------------------------- Vitals Details Patient Name: Date of Service: [name], Shawn BERT D. 06/11/2020 12:45 PM Medical Record Number: [id number] Patient Account Number: [id number] Date of Birth/Sex: Treating RN: [birth year] (74 y.o. Jerilynn Mages) Carlene Coria Primary Care Tonnie Stillman: [name] Other Clinician: Referring Victory Dresden: Treating Kawon Willcutt/Extender: [name] in Treatment: 13 Vital Signs Time Taken: 13:21 Temperature (F): 98.4 Height (in): 74 Pulse (bpm): 73 Weight (lbs): 260 Respiratory Rate (breaths/min): 18 Body Mass Index (BMI): 33.4 Blood Pressure (mmHg): 118/73 Reference Range: 80 - 120 mg / dl Electronic Signature(s) Signed: 06/11/2020 5:23:39 PM By: Carlene Coria RN Entered By: Carlene Coria on 06/11/2020 13:22:18
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
## 2020-06-16 ENCOUNTER — Ambulatory Visit (INDEPENDENT_AMBULATORY_CARE_PROVIDER_SITE_OTHER): Payer: Medicare Other | Admitting: Internal Medicine

## 2020-06-16 ENCOUNTER — Encounter: Payer: Self-pay | Admitting: Internal Medicine

## 2020-06-16 ENCOUNTER — Ambulatory Visit (HOSPITAL_COMMUNITY)
Admission: RE | Admit: 2020-06-16 | Discharge: 2020-06-16 | Disposition: A | Discharge status: Home / Self Care | Payer: Medicare Other | Source: Ambulatory Visit | Attending: Internal Medicine | Admitting: Internal Medicine

## 2020-06-16 VITALS — BP 138/72 | HR 91 | Temp 98.4°F | Wt 264.2 lb

## 2020-06-16 DIAGNOSIS — R739 Hyperglycemia, unspecified: Secondary | ICD-10-CM

## 2020-06-16 DIAGNOSIS — K220 Achalasia of cardia: Secondary | ICD-10-CM

## 2020-06-16 DIAGNOSIS — R0600 Dyspnea, unspecified: Secondary | ICD-10-CM

## 2020-06-16 DIAGNOSIS — Z122 Encounter for screening for malignant neoplasm of respiratory organs: Secondary | ICD-10-CM

## 2020-06-16 DIAGNOSIS — I10 Essential (primary) hypertension: Secondary | ICD-10-CM

## 2020-06-16 DIAGNOSIS — Z720 Tobacco use: Secondary | ICD-10-CM

## 2020-06-16 DIAGNOSIS — R0602 Shortness of breath: Secondary | ICD-10-CM | POA: Insufficient documentation

## 2020-06-16 DIAGNOSIS — I110 Hypertensive heart disease with heart failure: Secondary | ICD-10-CM

## 2020-06-16 DIAGNOSIS — D500 Iron deficiency anemia secondary to blood loss (chronic): Secondary | ICD-10-CM

## 2020-06-16 DIAGNOSIS — R0609 Other forms of dyspnea: Secondary | ICD-10-CM

## 2020-06-16 DIAGNOSIS — M1711 Unilateral primary osteoarthritis, right knee: Secondary | ICD-10-CM

## 2020-06-16 DIAGNOSIS — I5032 Chronic diastolic (congestive) heart failure: Secondary | ICD-10-CM

## 2020-06-16 DIAGNOSIS — Z862 Personal history of diseases of the blood and blood-forming organs and certain disorders involving the immune mechanism: Secondary | ICD-10-CM

## 2020-06-16 MED ORDER — OXYCODONE-ACETAMINOPHEN 10-325 MG PO TABS: 1.0000 | ORAL_TABLET | Freq: Three times a day (TID) | ORAL | 0 refills | Status: DC | PRN

## 2020-06-16 NOTE — Patient Instructions (Addendum)
Thank you for allowing [place] to provide your care today. Today we discussed your shortness of breath with exertion.     I have ordered labs for you and will call if any are abnormal.   I have placed an order for echocardiogram.   I have placed an order for Chest CT imaging for lung cancer screening.   Please follow up with your GI doctor.  Please follow-up after completing your echocardiogram.     Please call the internal medicine center clinic if you have any questions or concerns, we may be able to help and keep you from a long and expensive emergency room wait. Our clinic and after hours phone number is [phone], the best time to call is Monday through Friday 9 am to 4 pm but there is always someone available 24/7 if you have an emergency. If you need medication refills please notify your pharmacy one week in advance and they will send [place] a request.

## 2020-06-16 NOTE — Assessment & Plan Note (Addendum)
Continues to have shortness of breath that has been worse with exertion for a little over one month. He does not think he has had chest pain but is not sure. He has chronic cough which is at baseline. He continues to have swelling in his legs.  ECG today unchanged from prior in 2020 with 1st degree AV block, LVH, AV velocity 2.0 m/s from 1.6 in 2017. [name] BNP during last visit 700, improved from 9/29. 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 III/VI RUSB, murmur, noted on prior exams as well. Lungs CTA, no JVP.   - repeat echo, f/u after echo or 1 mo - continue lasix 40 mg qd - hx of IDA - check CBC, ferritin, IBC  - CT lung cancer screen

## 2020-06-16 NOTE — Progress Notes (Signed)
   CC: shortness of breath  HPI:  [name] is a 74 y.o. with PMH as below.   Please see A&P for assessment of the patient's acute and chronic medical conditions.   Continues to have shortness of breath that has been worse with exertion for a little over one month. No dizziness. He does not think he has had chest pain but is not sure. He has chronic cough which is at baseline. He continues to have swelling in his legs, which is unchanged.   He has been having trouble swallowing liquids the last week. He does not feel like he's inhaled liquid but it just sits in his throat and will not go down sometimes.  Past Medical History:  Diagnosis Date  . 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  Excellent symptomatic response to LES Botox injections.   . Anemia 08/20/2018  . AVM (arteriovenous malformation) of colon 08/26/2013   Cecum X 3, without bleeding on colonoscopy February 2015   . B12 deficiency 10/01/2011   Discovered to 2 progressive neurologic pain and dysfunction.  Diagnosis established March 2013.  Requires the following treatment: B12 IM monthly until level normalizes. After that, oral supplementation.   . Cataract   . Chronic venous insufficiency 11/28/2013  . Constipation due to opioid therapy 03/11/2015  . Deep venous thrombosis ([place]) 05/31/2018   Right lower extremity, unprovoked  . Diverticulosis 08/26/2013  . Essential hypertension 06/15/2006  . Family history of malignant neoplasm of gastrointestinal tract 06/30/2013   Father with colon cancer age 40   . Gastroesophageal reflux disease 02/04/2007  . GI bleeding 08/21/2018  . Internal hemorrhoids 08/26/2013  . Left posterior subcapsular cataract 10/03/2013   s/p yag laser capsulotomy 10/03/2013   . 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 Lumbar stenosis with neurogenic  claudication 06/16/2006   Moderate: L2 through L4.  Complicated by chronic low back pain and neuropathy.  Nerve conduction study (10/19/11): Diffusely low motor amplitudes, with primarily involvement of the peroneal and posterior tibial nerves. No evidence of generalized peripheral neuropathy. Findings could be c/w bil multilevel lumbosacral radiculopathies or a primary motor neuropathy.   . Mild aortic valve stenosis 10/09/2006   Echo (12/09/2009): Valve area: 2.03 cm2   . Neuromuscular disorder ([place])   . Neuropathy   . Obesity, Class I, BMI 30-34.9 04/05/2012  . Onychomycosis of toenail 06/05/2014  . Osteoarthritis 11/25/2009   Right knee (medial compartment), right hip, left shoulder   . Peripheral neuropathy   . 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 Trigger little finger of right hand 03/11/2015   Review of Systems:   10 point ROS negative except as noted in HPI  Physical Exam: Constitution: NAD, appears stated age  Cardio: III/VI systolic murmur, +1 LE edema, no JVP Respiratory: CTA, no w/r/r Abdominal: NTTP, soft, non-distended MSK: moving all extremities Neuro: normal affect, a&ox3 Skin: c/d/i    Vitals:   06/16/20 1313  BP: 138/72  Pulse: 91  Temp: 98.4 F (36.9 C)  TempSrc: Oral  SpO2: 99%  Weight: 264 lb 3.2 oz (119.8 kg)     Assessment & Plan:   See Encounters Tab for problem based charting.  Patient discussed with Dr. Angelia Mould

## 2020-06-16 NOTE — Assessment & Plan Note (Signed)
Discussed tobacco cessation. Lung CT screen ordered.

## 2020-06-16 NOTE — Assessment & Plan Note (Addendum)
See heart failure.   ADDENDUM: ferritin and TIBC low. Will order Nu-Iron po with history of reaction to iron transfusion. Discussed with patient. No dark or bloody stools. Recommended f/u with GI.

## 2020-06-16 NOTE — Assessment & Plan Note (Signed)
He has been having trouble swallowing liquids the last week. He does not feel like he's inhaled liquid but it just sits in his throat and will not go down sometimes.   - has history of achalasia with last botox injection 06/2017. Recommend he follow up with his GI doctor

## 2020-06-17 ENCOUNTER — Encounter (HOSPITAL_BASED_OUTPATIENT_CLINIC_OR_DEPARTMENT_OTHER): Payer: Medicare Other | Admitting: Internal Medicine

## 2020-06-17 LAB — CBC
Hematocrit: 37.6 % (ref 37.5–51.0)
Hemoglobin: 12 g/dL — ABNORMAL LOW (ref 13.0–17.7)
MCH: 28 pg (ref 26.6–33.0)
MCHC: 31.9 g/dL (ref 31.5–35.7)
MCV: 88 fL (ref 79–97)
Platelets: 214 10*3/uL (ref 150–450)
RBC: 4.28 x10E6/uL (ref 4.14–5.80)
RDW: 17 % — ABNORMAL HIGH (ref 11.6–15.4)
WBC: 5.3 10*3/uL (ref 3.4–10.8)

## 2020-06-17 LAB — HEMOGLOBIN A1C
Est. average glucose Bld gHb Est-mCnc: 105 mg/dL
Hgb A1c MFr Bld: 5.3 % (ref 4.8–5.6)

## 2020-06-17 LAB — IRON AND TIBC
Iron Saturation: 11 % — ABNORMAL LOW (ref 15–55)
Iron: 42 ug/dL (ref 38–169)
Total Iron Binding Capacity: 384 ug/dL (ref 250–450)
UIBC: 342 ug/dL (ref 111–343)

## 2020-06-17 LAB — FERRITIN: Ferritin: 42 ng/mL (ref 30–400)

## 2020-06-17 LAB — LIPID PANEL
Chol/HDL Ratio: 1.9 ratio (ref 0.0–5.0)
Cholesterol, Total: 174 mg/dL (ref 100–199)
HDL: 93 mg/dL (ref >39)
LDL Chol Calc (NIH): 69 mg/dL (ref 0–99)
Triglycerides: 59 mg/dL (ref 0–149)
VLDL Cholesterol Cal: 12 mg/dL (ref 5–40)

## 2020-06-23 ENCOUNTER — Other Ambulatory Visit: Payer: Self-pay | Admitting: Internal Medicine

## 2020-06-23 ENCOUNTER — Encounter (HOSPITAL_BASED_OUTPATIENT_CLINIC_OR_DEPARTMENT_OTHER): Payer: Medicare Other | Admitting: Physician Assistant

## 2020-06-23 ENCOUNTER — Other Ambulatory Visit: Payer: Self-pay

## 2020-06-23 DIAGNOSIS — R0609 Other forms of dyspnea: Secondary | ICD-10-CM

## 2020-06-23 DIAGNOSIS — T8131XD Disruption of external operation (surgical) wound, not elsewhere classified, subsequent encounter: Secondary | ICD-10-CM | POA: Diagnosis not present

## 2020-06-23 DIAGNOSIS — D500 Iron deficiency anemia secondary to blood loss (chronic): Secondary | ICD-10-CM

## 2020-06-23 MED ORDER — POLYSACCHARIDE IRON COMPLEX 150 MG PO CAPS: 150.0000 mg | ORAL_CAPSULE | Freq: Every day | ORAL | 11 refills | Status: DC

## 2020-06-28 NOTE — Progress Notes (Signed)
Internal Medicine Clinic Attending  Case discussed with Dr. Seawell  At the time of the visit.  We reviewed the resident's history and exam and pertinent patient test results.  I agree with the assessment, diagnosis, and plan of care documented in the resident's note.  

## 2020-06-29 NOTE — Progress Notes (Signed)
VEDANSH, MURATALLA ([id number]) Visit Report for 06/23/2020 Arrival Information Details Patient Name: Date of Service: Jarvis Newcomer 06/23/2020 11:00 A M Medical Record Number: [id number] Patient Account Number: [id number] Date of Birth/Sex: Treating RN: 1945/09/08 (75 y.o. [name], [name] Primary Care Ada Holness: [name] Other Clinician: Referring Jenya Putz: Treating Lakelynn Severtson/Extender: Monico Hoar in Treatment: 15 Visit Information History Since Last Visit Added or deleted any medications: No Patient Arrived: [name] Any new allergies or adverse reactions: No Arrival Time: 10:50 Had a fall or experienced change in No Accompanied By: self activities of daily living that may affect Transfer Assistance: None risk of falls: Patient Identification Verified: Yes Signs or symptoms of abuse/neglect since last visito No Secondary Verification Process Completed: Yes Hospitalized since last visit: No Patient Requires Transmission-Based Precautions: No Implantable device outside of the clinic excluding No Patient Has Alerts: No cellular tissue based products placed in the center since last visit: Has Dressing in Place as Prescribed: Yes Pain Present Now: No Electronic Signature(s) Signed: 06/29/2020 11:46:23 AM By: Sandre Kitty Entered By: Sandre Kitty on 06/23/2020 10:51:10 -------------------------------------------------------------------------------- Compression Therapy Details Patient Name: Date of Service: [name], [name] 06/23/2020 11:00 A M Medical Record Number: [id number] Patient Account Number: [id number] Date of Birth/Sex: Treating RN: 07-Jan-1946 (75 y.o. Erie Noe Primary Care Veleka Djordjevic: [name] Other Clinician: Referring Laverta Harnisch: Treating Kaidan Spengler/Extender: Monico Hoar in Treatment: 15 Compression Therapy Performed for Wound Assessment: Wound #1 Left,Lateral Foot Performed By:  Clinician Rhae Hammock, RN Compression Type: Three Layer Electronic Signature(s) Signed: 06/23/2020 2:09:35 PM By: Rhae Hammock RN Entered By: Rhae Hammock on 06/23/2020 11:32:58 -------------------------------------------------------------------------------- Encounter Discharge Information Details Patient Name: Date of Service: [name], [name] 06/23/2020 11:00 A M Medical Record Number: [id number] Patient Account Number: [id number] Date of Birth/Sex: Treating RN: 1945/09/02 (75 y.o. Erie Noe Primary Care Kealani Leckey: Other Clinician: [name] Referring Amberrose Friebel: Treating Madysen Faircloth/Extender: Monico Hoar in Treatment: 15 Encounter Discharge Information Items Discharge Condition: Stable Ambulatory Status: Ambulatory Discharge Destination: Home Transportation: Private Auto Accompanied By: self Schedule Follow-up Appointment: Yes Clinical Summary of Care: Patient Declined Electronic Signature(s) Signed: 06/23/2020 2:09:35 PM By: Rhae Hammock RN Entered By: Rhae Hammock on 06/23/2020 11:33:41 -------------------------------------------------------------------------------- Patient/Caregiver Education Details Patient Name: Date of Service: PULLIA M, [name] 12/29/2021andnbsp11:00 A M Medical Record Number: [id number] Patient Account Number: [id number] Date of Birth/Gender: Treating RN: 1945-11-28 (75 y.o. Erie Noe Primary Care Physician: [name] Other Clinician: Referring Physician: Treating Physician/Extender: Monico Hoar in Treatment: 15 Education Assessment Education Provided To: Patient Education Topics Provided Wound/Skin Impairment: Methods: Explain/Verbal Responses: State content correctly [employer]) Signed: 06/23/2020 2:09:35 PM By: Rhae Hammock RN Entered By: Rhae Hammock on 06/23/2020  11:33:24 -------------------------------------------------------------------------------- Wound Assessment Details Patient Name: Date of Service: [name], [name] 06/23/2020 11:00 A M Medical Record Number: [id number] Patient Account Number: [id number] Date of Birth/Sex: Treating RN: [birth year] (75 y.o. [name] Primary Care Cache Bills: [name] Other Clinician: Referring Dishon Kehoe: Treating Jaedon Siler/Extender: Monico Hoar in Treatment: 15 Wound Status Wound Number: 1 Primary Etiology: Dehisced Wound Wound Location: Left, Lateral Foot Wound Status: Open Wounding Event: Gradually Appeared Date Acquired: 02/03/2019 Weeks Of Treatment: 15 Clustered Wound: No Wound Measurements Length: (cm) 0.9 Width: (cm) 0.4 Depth: (cm) 0.3 Area: (cm) 0.283 Volume: (cm) 0.085 % Reduction in Area: 86.7% % Reduction in Volume: 86.6% Wound Description Classification: Full  Thickness Without Exposed Support Structu res Treatment Notes Wound #1 (Foot) Wound Laterality: Left, Lateral Cleanser Peri-Wound Care Sween Lotion (Moisturizing lotion) Discharge Instruction: Apply moisturizing lotion as directed Topical Primary Dressing Endoform 2x2 in Discharge Instruction: Moisten with saline Secondary Dressing Woven Gauze Sponge, Non-Sterile 4x4 in Discharge Instruction: Apply over primary dressing as directed. Secured With Compression Wrap ThreePress (3 layer compression wrap) Discharge Instruction: Apply three layer compression as directed. Compression Stockings [place] Multi Layer Compression Stocking Quantity: 1 Left Leg Compression Amount: 30-40 mmHg Right Leg Compression Amount: 30-40 mmHg Discharge Instruction: Apply compression stocking daily as instructed. Apply first thing in the morning, remove at night before bed. Add-Ons Electronic Signature(s) Signed: 06/28/2020 2:31:34 PM By: Zenaida Deed RN, BSN Signed: 06/29/2020 11:46:23 AM By:  Karl Ito Entered By: Karl Ito on 06/23/2020 10:51:37 -------------------------------------------------------------------------------- Vitals Details Patient Name: Date of Service: Lesle Chris, [name] 06/23/2020 11:00 A M Medical Record Number: [id number] Patient Account Number: [id number] Date of Birth/Sex: Treating RN: 1946/03/19 (75 y.o. Damaris Schooner Primary Care Teandre Hamre: Carlynn Purl Other Clinician: Referring Baltasar Twilley: Treating Saylee Sherrill/Extender: Corky Sox in Treatment: 15 Vital Signs Time Taken: 10:51 Temperature (F): 98.4 Height (in): 74 Pulse (bpm): 73 Weight (lbs): 260 Respiratory Rate (breaths/min): 19 Body Mass Index (BMI): 33.4 Blood Pressure (mmHg): 122/77 Reference Range: 80 - 120 mg / dl Electronic Signature(s) Signed: 06/29/2020 11:46:23 AM By: Karl Ito Entered By: Karl Ito on 06/23/2020 10:51:27

## 2020-06-29 NOTE — Progress Notes (Signed)
WILLIS, HOLQUIN ([id number]) Visit Report for 06/23/2020 SuperBill Details Patient Name: Date of Service: [name] 06/23/2020 Medical Record Number: [id number] Patient Account Number: [id number] Date of Birth/Sex: Treating RN: 15-Mar-1946 (75 y.o. Charlean Merl, Lauren Primary Care Provider: Carlynn Purl Other Clinician: Referring Provider: Treating Provider/Extender: Corky Sox in Treatment: 15 Diagnosis Coding ICD-10 Codes Code Description T81.31XD Disruption of external operation (surgical) wound, not elsewhere classified, subsequent encounter L97.522 Non-pressure chronic ulcer of other part of left foot with fat layer exposed I87.332 Chronic venous hypertension (idiopathic) with ulcer and inflammation of left lower extremity S80.811D Abrasion, right lower leg, subsequent encounter L97.811 Non-pressure chronic ulcer of other part of right lower leg limited to breakdown of skin L84 Corns and callosities Facility Procedures CPT4 Code Description Modifier Quantity [id number] (Facility Use Only) [phone] - APPLY MULTLAY COMPRS LWR LT LEG 1 Electronic Signature(s) Signed: 06/23/2020 2:09:35 PM By: Fonnie Mu RN Signed: 06/29/2020 4:34:46 PM By: Lenda Kelp PA-C Entered By: Fonnie Mu on 06/23/2020 11:33:56

## 2020-07-02 ENCOUNTER — Other Ambulatory Visit (HOSPITAL_COMMUNITY)
Admission: RE | Admit: 2020-07-02 | Discharge: 2020-07-02 | Disposition: A | Discharge status: Home / Self Care | Payer: Medicare Other | Source: Other Acute Inpatient Hospital | Attending: Internal Medicine | Admitting: Internal Medicine

## 2020-07-02 ENCOUNTER — Encounter (HOSPITAL_BASED_OUTPATIENT_CLINIC_OR_DEPARTMENT_OTHER): Payer: Medicare Other | Attending: Internal Medicine | Admitting: Internal Medicine

## 2020-07-02 ENCOUNTER — Other Ambulatory Visit (HOSPITAL_COMMUNITY): Admission: RE | Admit: 2020-07-02 | Payer: Medicare Other

## 2020-07-02 ENCOUNTER — Other Ambulatory Visit: Payer: Self-pay

## 2020-07-02 DIAGNOSIS — I87332 Chronic venous hypertension (idiopathic) with ulcer and inflammation of left lower extremity: Secondary | ICD-10-CM | POA: Insufficient documentation

## 2020-07-02 DIAGNOSIS — L089 Local infection of the skin and subcutaneous tissue, unspecified: Secondary | ICD-10-CM | POA: Diagnosis present

## 2020-07-02 DIAGNOSIS — A4901 Methicillin susceptible Staphylococcus aureus infection, unspecified site: Secondary | ICD-10-CM | POA: Insufficient documentation

## 2020-07-02 DIAGNOSIS — T8131XD Disruption of external operation (surgical) wound, not elsewhere classified, subsequent encounter: Secondary | ICD-10-CM | POA: Diagnosis not present

## 2020-07-02 DIAGNOSIS — L97529 Non-pressure chronic ulcer of other part of left foot with unspecified severity: Secondary | ICD-10-CM | POA: Diagnosis present

## 2020-07-02 DIAGNOSIS — L97522 Non-pressure chronic ulcer of other part of left foot with fat layer exposed: Secondary | ICD-10-CM | POA: Insufficient documentation
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
## 2020-07-05 LAB — AEROBIC CULTURE W GRAM STAIN (SUPERFICIAL SPECIMEN): Gram Stain: NONE SEEN

## 2020-07-06 NOTE — Progress Notes (Signed)
[name] ([id number]) Visit Report for 07/02/2020 Arrival Information Details Patient Name: Date of Service: [name] 07/02/2020 1:30 PM Medical Record Number: [id number] Patient Account Number: [id number] Date of Birth/Sex: Treating RN: 1946/03/07 (75 y.o. Male) Baruch Gouty Primary Care Dahiana Kulak: [name] Other Clinician: Referring Canuto Kingston: Treating Merriam Brandner/Extender: [name] in Treatment: 48 Visit Information History Since Last Visit Added or deleted any medications: No Patient Arrived: Wheel Chair Any new allergies or adverse reactions: No Arrival Time: 13:30 Had a fall or experienced change in No Accompanied By: self activities of daily living that may affect Transfer Assistance: None risk of falls: Patient Identification Verified: Yes Signs or symptoms of abuse/neglect since last visito No Secondary Verification Process Completed: Yes Hospitalized since last visit: No Patient Requires Transmission-Based Precautions: No Implantable device outside of the clinic excluding No Patient Has Alerts: No cellular tissue based products placed in the center since last visit: Has Dressing in Place as Prescribed: Yes Pain Present Now: No Electronic Signature(s) Signed: 07/06/2020 10:39:15 AM By: Sandre Kitty Entered By: Sandre Kitty on 07/02/2020 13:30:45 -------------------------------------------------------------------------------- Encounter Discharge Information Details Patient Name: Date of Service: [name], Shawn BERT D. 07/02/2020 1:30 PM Medical Record Number: [id number] Patient Account Number: [id number] Date of Birth/Sex: Treating RN: 1946/03/12 (75 y.o. Male) [name] Primary Care Nivek Powley: [name] Other Clinician: Referring Astha Probasco: Treating Louvenia Golomb/Extender: [name] in Treatment: 16 Encounter Discharge Information Items Post Procedure Vitals Discharge Condition:  Stable Temperature (F): 98.3 Ambulatory Status: Walker Pulse (bpm): 106 Discharge Destination: Home Respiratory Rate (breaths/min): 19 Transportation: Private Auto Blood Pressure (mmHg): 138/79 Accompanied By: self Schedule Follow-up Appointment: Yes Clinical Summary of Care: Electronic Signature(s) Signed: 07/02/2020 4:33:40 PM By: [name] Entered By: [name] on 07/02/2020 15:55:40 -------------------------------------------------------------------------------- Lower Extremity Assessment Details Patient Name: Date of Service: [name], Shawn BERT D. 07/02/2020 1:30 PM Medical Record Number: [id number] Patient Account Number: [id number] Date of Birth/Sex: Treating RN: [birth year] (75 y.o. Male) [name] Primary Care Jerrion Tabbert: [name] Other Clinician: Referring Kaye Mitro: Treating Aleiah Mohammed/Extender: [name] in Treatment: 16 Edema Assessment Assessed: [name]: Yes] [Right: No] Edema: [Left: Ye] [Right: s] Calf Left: Right: Point of Measurement: From Medial Instep 36 cm Ankle Left: Right: Point of Measurement: From Medial Instep 24 cm Electronic Signature(s) Signed: 07/02/2020 4:33:40 PM By: [name] Entered By: [name] on 07/02/2020 13:43:09 -------------------------------------------------------------------------------- Multi Wound Chart Details Patient Name: Date of Service: [name], Shawn BERT D. 07/02/2020 1:30 PM Medical Record Number: [id number] Patient Account Number: [id number] Date of Birth/Sex: Treating RN: [birth year] (75 y.o. Male) Baruch Gouty Primary Care Tiny Chaudhary: [name] Other Clinician: Referring Charnice Zwilling: Treating Rondi Ivy/Extender: [name] in Treatment: 16 Vital Signs Height(in): 65 Pulse(bpm): 106 Weight(lbs): 260 Blood Pressure(mmHg): 138/79 Body Mass Index(BMI): 33 Temperature(F): 98.3 Respiratory Rate(breaths/min): 19 Photos: [1:No Photos Left, Lateral  Foot] [N/A:N/A N/A] Wound Location: [1:Gradually Appeared] [N/A:N/A] Wounding Event: [1:Dehisced Wound] [N/A:N/A] Primary Etiology: [1:Cataracts, Glaucoma, Anemia, Deep] [N/A:N/A] Comorbid History: [1:Vein Thrombosis, Hypertension, Peripheral Venous Disease, Osteoarthritis 02/03/2019] [N/A:N/A] Date Acquired: [1:16] [N/A:N/A] Weeks of Treatment: [1:Open] [N/A:N/A] Wound Status: [1:0.5x0.2x0.5] [N/A:N/A] Measurements L x W x D (cm) [1:0.079] [N/A:N/A] A (cm) : rea [1:0.039] [N/A:N/A] Volume (cm) : [1:96.30%] [N/A:N/A] % Reduction in Area: [1:93.90%] [N/A:N/A] % Reduction in Volume: [1:2] Starting Position 1 (o'clock): [1:11] Ending Position 1 (o'clock): [1:0.5] Maximum Distance 1 (cm): [1:Yes] [N/A:N/A] Undermining: [1:Full Thickness Without Exposed] [N/A:N/A] Classification: [1:Support Structures Medium] [N/A:N/A] Exudate Amount: [1:Purulent] [  N/A:N/A] Exudate Type: [1:yellow, brown, green] [N/A:N/A] Exudate Color: [1:Distinct, outline attached] [N/A:N/A] Wound Margin: [1:Large (67-100%)] [N/A:N/A] Granulation Amount: [1:Pink, Pale] [N/A:N/A] Granulation Quality: [1:Small (1-33%)] [N/A:N/A] Necrotic Amount: [1:Fat Layer (Subcutaneous Tissue): Yes N/A] Exposed Structures: [1:Fascia: No Tendon: No Muscle: No Joint: No Bone: No None] [N/A:N/A] Treatment Notes Electronic Signature(s) Signed: 07/02/2020 4:46:42 PM By: Baruch Gouty RN, BSN Signed: 07/02/2020 4:48:18 PM By: Linton Ham MD Entered By: Linton Ham on 07/02/2020 14:56:07 -------------------------------------------------------------------------------- Multi-Disciplinary Care Plan Details Patient Name: Date of Service: [name], Tobey Bride D. 07/02/2020 1:30 PM Medical Record Number: [id number] Patient Account Number: [id number] Date of Birth/Sex: Treating RN: [birth year] (75 y.o. Male) Baruch Gouty Primary Care Salena Ortlieb: [name] Other Clinician: Referring Kenyia Wambolt: Treating Modestine Scherzinger/Extender: [name] in Treatment: 16 Active Inactive Venous Leg Ulcer Nursing Diagnoses: Knowledge deficit related to disease process and management Potential for venous Insuffiency (use before diagnosis confirmed) Goals: Patient will maintain optimal edema control Date Initiated: 06/04/2020 Target Resolution Date: 07/30/2020 Goal Status: Active Interventions: Assess peripheral edema status every visit. Compression as ordered Treatment Activities: Therapeutic compression applied : 06/04/2020 Notes: Wound/Skin Impairment Nursing Diagnoses: Knowledge deficit related to ulceration/compromised skin integrity Goals: Patient/caregiver will verbalize understanding of skin care regimen Date Initiated: 03/09/2020 Target Resolution Date: 07/30/2020 Goal Status: Active Ulcer/skin breakdown will have a volume reduction of 30% by week 4 Date Initiated: 03/09/2020 Date Inactivated: 04/16/2020 Target Resolution Date: 05/28/2020 Goal Status: Unmet Unmet Reason: necrotic Ulcer/skin breakdown will have a volume reduction of 50% by week 8 Date Initiated: 04/16/2020 Date Inactivated: 05/17/2020 Target Resolution Date: 05/14/2020 Goal Status: Met Interventions: Assess patient/caregiver ability to obtain necessary supplies Assess patient/caregiver ability to perform ulcer/skin care regimen upon admission and as needed Assess ulceration(s) every visit Notes: Electronic Signature(s) Signed: 07/02/2020 4:46:42 PM By: Baruch Gouty RN, BSN Entered By: Baruch Gouty on 07/02/2020 16:33:18 -------------------------------------------------------------------------------- Pain Assessment Details Patient Name: Date of Service: [name], Shawn BERT D. 07/02/2020 1:30 PM Medical Record Number: [id number] Patient Account Number: [id number] Date of Birth/Sex: Treating RN: 06-07-1946 (75 y.o. Male) Baruch Gouty Primary Care Jakeya Gherardi: [name] Other Clinician: Referring Javae Braaten: Treating  Jeena Arnett/Extender: [name] in Treatment: 16 Active Problems Location of Pain Severity and Description of Pain Patient Has Paino No Site Locations Pain Management and Medication Current Pain Management: Electronic Signature(s) Signed: 07/02/2020 4:46:42 PM By: Baruch Gouty RN, BSN Signed: 07/06/2020 10:39:15 AM By: Sandre Kitty Entered By: Sandre Kitty on 07/02/2020 13:31:10 -------------------------------------------------------------------------------- Patient/Caregiver Education Details Patient Name: Date of Service: [name] 1/7/2022andnbsp1:30 PM Medical Record Number: [id number] Patient Account Number: [id number] Date of Birth/Gender: Treating RN: 1946/04/08 (74 y.o. Male) Baruch Gouty Primary Care Physician: [name] Other Clinician: Referring Physician: Treating Physician/Extender: [name] in Treatment: 16 Education Assessment Education Provided To: Patient Education Topics Provided Venous: Methods: Explain/Verbal Responses: Reinforcements needed, State content correctly Wound/Skin Impairment: Methods: Explain/Verbal Responses: Reinforcements needed, State content correctly Electronic Signature(s) Signed: 07/02/2020 4:46:42 PM By: Baruch Gouty RN, BSN Entered By: Baruch Gouty on 07/02/2020 16:33:50 -------------------------------------------------------------------------------- Wound Assessment Details Patient Name: Date of Service: [name], Shawn BERT D. 07/02/2020 1:30 PM Medical Record Number: [id number] Patient Account Number: [id number] Date of Birth/Sex: Treating RN: 1946-02-23 (74 y.o. Male) [name] Primary Care Jona Erkkila: [name] Other Clinician: Referring Cavion Faiola: Treating Lindberg Zenon/Extender: [name] in Treatment: 16 Wound Status Wound Number: 1 Primary Dehisced Wound Etiology: Wound Location: Left, Lateral Foot Wound  Open Wounding Event: Gradually Appeared  Status: Date Acquired: 02/03/2019 Comorbid Cataracts, Glaucoma, Anemia, Deep Vein Thrombosis, Weeks Of Treatment: 16 History: Hypertension, Peripheral Venous Disease, Osteoarthritis Clustered Wound: No Wound Measurements Length: (cm) 0.5 Width: (cm) 0.2 Depth: (cm) 0.5 Area: (cm) 0.079 Volume: (cm) 0.039 % Reduction in Area: 96.3% % Reduction in Volume: 93.9% Epithelialization: None Tunneling: No Undermining: Yes Starting Position (o'clock): 2 Ending Position (o'clock): 11 Maximum Distance: (cm) 0.5 Wound Description Classification: Full Thickness Without Exposed Support Structures Wound Margin: Distinct, outline attached Exudate Amount: Medium Exudate Type: Purulent Exudate Color: yellow, brown, green Foul Odor After Cleansing: No Slough/Fibrino Yes Wound Bed Granulation Amount: Large (67-100%) Exposed Structure Granulation Quality: Pink, Pale Fascia Exposed: No Necrotic Amount: Small (1-33%) Fat Layer (Subcutaneous Tissue) Exposed: Yes Necrotic Quality: Adherent Slough Tendon Exposed: No Muscle Exposed: No Joint Exposed: No Bone Exposed: No Treatment Notes Wound #1 (Foot) Wound Laterality: Left, Lateral Cleanser Peri-Wound Care Sween Lotion (Moisturizing lotion) Discharge Instruction: Apply moisturizing lotion as directed Topical Primary Dressing Endoform 2x2 in Discharge Instruction: Moisten with saline Secondary Dressing Woven Gauze Sponge, Non-Sterile 4x4 in Discharge Instruction: Apply over primary dressing as directed. Drawtex 4x4 in Discharge Instruction: Apply over primary dressing cut to fit inside wound margins and fill hole Secured With Compression Wrap ThreePress (3 layer compression wrap) Discharge Instruction: Apply three layer compression as directed. Compression Stockings Carolon Multi Layer Compression Stocking Quantity: 1 Left Leg Compression Amount: 30-40 mmHg Right Leg Compression Amount:  30-40 mmHg Discharge Instruction: Apply compression stocking daily as instructed. Apply first thing in the morning, remove at night before bed. Add-Ons Electronic Signature(s) Signed: 07/02/2020 4:33:40 PM By: [name] Entered By: [name] on 07/02/2020 13:45:09 -------------------------------------------------------------------------------- Vitals Details Patient Name: Date of Service: [name], Shawn BERT D. 07/02/2020 1:30 PM Medical Record Number: [id number] Patient Account Number: [id number] Date of Birth/Sex: Treating RN: 1946-05-13 (74 y.o. Male) Baruch Gouty Primary Care Jessa Stinson: [name] Other Clinician: Referring Adriann Thau: Treating Icarus Partch/Extender: [name] in Treatment: 16 Vital Signs Time Taken: 13:30 Temperature (F): 98.3 Height (in): 74 Pulse (bpm): 106 Weight (lbs): 260 Respiratory Rate (breaths/min): 19 Body Mass Index (BMI): 33.4 Blood Pressure (mmHg): 138/79 Reference Range: 80 - 120 mg / dl Electronic Signature(s) Signed: 07/06/2020 10:39:15 AM By: Sandre Kitty Entered By: Sandre Kitty on 07/02/2020 13:31:04

## 2020-07-09 ENCOUNTER — Encounter (HOSPITAL_BASED_OUTPATIENT_CLINIC_OR_DEPARTMENT_OTHER): Payer: Medicare Other | Admitting: Internal Medicine

## 2020-07-15 ENCOUNTER — Telehealth: Payer: Self-pay | Admitting: *Deleted

## 2020-07-15 ENCOUNTER — Ambulatory Visit (HOSPITAL_COMMUNITY)
Admission: RE | Admit: 2020-07-15 | Discharge: 2020-07-15 | Disposition: A | Discharge status: Home / Self Care | Payer: Medicare Other | Source: Ambulatory Visit | Attending: Internal Medicine | Admitting: Internal Medicine

## 2020-07-15 ENCOUNTER — Other Ambulatory Visit: Payer: Self-pay

## 2020-07-15 DIAGNOSIS — R0609 Other forms of dyspnea: Secondary | ICD-10-CM

## 2020-07-15 DIAGNOSIS — I5032 Chronic diastolic (congestive) heart failure: Secondary | ICD-10-CM | POA: Diagnosis not present

## 2020-07-15 DIAGNOSIS — R0600 Dyspnea, unspecified: Secondary | ICD-10-CM | POA: Diagnosis present

## 2020-07-15 DIAGNOSIS — I352 Nonrheumatic aortic (valve) stenosis with insufficiency: Secondary | ICD-10-CM | POA: Diagnosis not present

## 2020-07-15 DIAGNOSIS — I119 Hypertensive heart disease without heart failure: Secondary | ICD-10-CM | POA: Insufficient documentation

## 2020-07-15 DIAGNOSIS — I110 Hypertensive heart disease with heart failure: Secondary | ICD-10-CM | POA: Diagnosis not present

## 2020-07-15 LAB — ECHOCARDIOGRAM COMPLETE
AR max vel: 2.02 cm2
AV Area VTI: 1.89 cm2
AV Area mean vel: 1.81 cm2
AV Mean grad: 18 mmHg
AV Peak grad: 25.7 mmHg
Ao pk vel: 2.54 m/s
P 1/2 time: 1174 msec
S' Lateral: 3.2 cm

## 2020-07-15 NOTE — Telephone Encounter (Signed)
Call from [facility], [place] CT dept - stated they need a CT chest lung cancer screening order signed by the doctor and faxed to them @ [phone]. She stated for unknown reason they are unable to the signed order. Pt is scheduled Monday 07/19/20. Sign order in chart printed and faxed. Thanks

## 2020-07-15 NOTE — Telephone Encounter (Signed)
Talked to [facility] -stated need an actual doctor's signature. Re-printed the order; I asked Dr Drema Dallas, to sign. Order faxed to [phone].

## 2020-07-15 NOTE — Progress Notes (Signed)
  Echocardiogram 2D Echocardiogram has been performed.  [name] 07/15/2020, 3:01 PM

## 2020-07-16 ENCOUNTER — Other Ambulatory Visit: Payer: Self-pay

## 2020-07-16 ENCOUNTER — Encounter (HOSPITAL_BASED_OUTPATIENT_CLINIC_OR_DEPARTMENT_OTHER): Payer: Medicare Other | Admitting: Internal Medicine

## 2020-07-16 DIAGNOSIS — T8131XD Disruption of external operation (surgical) wound, not elsewhere classified, subsequent encounter: Secondary | ICD-10-CM | POA: Diagnosis not present
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
## 2020-07-16 NOTE — Progress Notes (Signed)
JOSHUAJAMES, MOEHRING ([id number]) Visit Report for 07/16/2020 Arrival Information Details Patient Name: Date of Service: [name] 07/16/2020 8:45 A M Medical Record Number: [id number] Patient Account Number: [id number] Date of Birth/Sex: Treating RN: [birth year] (75 y.o. Lorette Ang, Meta.Reding Primary Care Loveda Colaizzi: [name] Other Clinician: Referring Auryn Paige: Treating Leyton Brownlee/Extender: [name] in Treatment: 24 Visit Information History Since Last Visit Added or deleted any medications: No Patient Arrived: Ambulatory Any new allergies or adverse reactions: No Arrival Time: 08:20 Had a fall or experienced change in No Accompanied By: self activities of daily living that may affect Transfer Assistance: None risk of falls: Patient Identification Verified: Yes Signs or symptoms of abuse/neglect since last visito No Secondary Verification Process Completed: Yes Hospitalized since last visit: No Patient Requires Transmission-Based Precautions: No Implantable device outside of the clinic excluding No Patient Has Alerts: No cellular tissue based products placed in the center since last visit: Has Dressing in Place as Prescribed: Yes Has Compression in Place as Prescribed: Yes Pain Present Now: No Electronic Signature(s) Signed: 07/16/2020 4:31:03 PM By: [name] Entered By: [name] on 07/16/2020 08:31:20 -------------------------------------------------------------------------------- Compression Therapy Details Patient Name: Date of Service: [name], [name] 07/16/2020 8:45 A M Medical Record Number: [id number] Patient Account Number: [id number] Date of Birth/Sex: Treating RN: 1945/12/21 (75 y.o. [name] Primary Care Tadashi Burkel: [name] Other Clinician: Referring Collen Vincent: Treating Terisa Belardo/Extender: [name] in Treatment: 18 Compression Therapy Performed for Wound Assessment: Wound #1  Left,Lateral Foot Performed By: Clinician [name], RN Compression Type: Three Layer Post Procedure Diagnosis Same as Pre-procedure Electronic Signature(s) Signed: 07/16/2020 4:31:51 PM By: Baruch Gouty RN, BSN Entered By: Baruch Gouty on 07/16/2020 08:57:16 -------------------------------------------------------------------------------- Encounter Discharge Information Details Patient Name: Date of Service: [name], [name] 07/16/2020 8:45 A M Medical Record Number: [id number] Patient Account Number: [id number] Date of Birth/Sex: Treating RN: 05/24/1946 (75 y.o. [name] Primary Care Eaden Hettinger: [name] Other Clinician: Referring Chai Routh: Treating Faaris Arizpe/Extender: [name] in Treatment: 18 Encounter Discharge Information Items Discharge Condition: Stable Ambulatory Status: Walker Discharge Destination: Home Transportation: Private Auto Accompanied By: self Schedule Follow-up Appointment: Yes Clinical Summary of Care: Electronic Signature(s) Signed: 07/16/2020 4:31:03 PM By: [name] Entered By: [name] on 07/16/2020 09:25:15 -------------------------------------------------------------------------------- Lower Extremity Assessment Details Patient Name: Date of Service: [name] 07/16/2020 8:45 A M Medical Record Number: [id number] Patient Account Number: [id number] Date of Birth/Sex: Treating RN: 01/11/1946 (75 y.o. [name] Primary Care Chinara Hertzberg: [name] Other Clinician: Referring Kacie Huxtable: Treating Alese Furniss/Extender: [name] in Treatment: 18 Edema Assessment Assessed: [name]: Yes] [name]: No] Edema: [Left: N] [Right: o] Calf Left: Right: Point of Measurement: From Medial Instep 36 cm Ankle Left: Right: Point of Measurement: From Medial Instep 23 cm Vascular Assessment Pulses: Dorsalis Pedis Palpable: [Left:Yes] Electronic  Signature(s) Signed: 07/16/2020 4:31:03 PM By: [name] Entered By: [name] on 07/16/2020 08:32:02 -------------------------------------------------------------------------------- Multi Wound Chart Details Patient Name: Date of Service: [name], [name] 07/16/2020 8:45 A M Medical Record Number: [id number] Patient Account Number: [id number] Date of Birth/Sex: Treating RN: 04/04/1946 (75 y.o. [name] Primary Care Joeph Szatkowski: [name] Other Clinician: Referring Jenayah Antu: Treating Baylon Santelli/Extender: [name] in Treatment: 18 Vital Signs Height(in): 74 Pulse(bpm): 84 Weight(lbs): 260 Blood Pressure(mmHg): 132/80 Body Mass Index(BMI): 33 Temperature(F): 98.5 Respiratory Rate(breaths/min): 18 Photos: [1:No Photos Left, Lateral Foot] [N/A:N/A N/A] Wound Location: [  1:Gradually Appeared] [N/A:N/A] Wounding Event: [1:Dehisced Wound] [N/A:N/A] Primary Etiology: [1:Cataracts, Glaucoma, Anemia, Deep N/A] Comorbid History: [1:Vein Thrombosis, Hypertension, Peripheral Venous Disease, Osteoarthritis 02/03/2019] [N/A:N/A] Date Acquired: [1:18] [N/A:N/A] Weeks of Treatment: [1:Open] [N/A:N/A] Wound Status: [1:1x0.3x0.3] [N/A:N/A] Measurements L x W x D (cm) [1:0.236] [N/A:N/A] A (cm) : rea [1:0.071] [N/A:N/A] Volume (cm) : [1:88.90%] [N/A:N/A] % Reduction in Area: [1:88.80%] [N/A:N/A] % Reduction in Volume: [1:Full Thickness Without Exposed] [N/A:N/A] Classification: [1:Support Structures Medium] [N/A:N/A] Exudate Amount: [1:Serosanguineous] [N/A:N/A] Exudate Type: [1:red, brown] [N/A:N/A] Exudate Color: [1:Distinct, outline attached] [N/A:N/A] Wound Margin: [1:None Present (0%)] [N/A:N/A] Granulation Amount: [1:Small (1-33%)] [N/A:N/A] Necrotic Amount: [1:Fat Layer (Subcutaneous Tissue): Yes N/A] Exposed Structures: [1:Fascia: No Tendon: No Muscle: No Joint: No Bone: No Large (67-100%)] [N/A:N/A] Epithelialization: [1:Compression  Therapy] [N/A:N/A] Treatment Notes Electronic Signature(s) Signed: 07/16/2020 4:10:40 PM By: Linton Ham MD Signed: 07/16/2020 4:31:51 PM By: Baruch Gouty RN, BSN Entered By: Linton Ham on 07/16/2020 09:01:09 -------------------------------------------------------------------------------- Multi-Disciplinary Care Plan Details Patient Name: Date of Service: [name], Tobey Bride D. 07/16/2020 8:45 A M Medical Record Number: [id number] Patient Account Number: [id number] Date of Birth/Sex: Treating RN: 04-28-1946 (75 y.o. [name] Primary Care Marquisha Nikolov: [name] Other Clinician: Referring Kainalu Heggs: Treating Ernest Orr/Extender: [name] in Treatment: 18 Active Inactive Venous Leg Ulcer Nursing Diagnoses: Knowledge deficit related to disease process and management Potential for venous Insuffiency (use before diagnosis confirmed) Goals: Patient will maintain optimal edema control Date Initiated: 06/04/2020 Target Resolution Date: 07/30/2020 Goal Status: Active Interventions: Assess peripheral edema status every visit. Compression as ordered Treatment Activities: Therapeutic compression applied : 06/04/2020 Notes: Wound/Skin Impairment Nursing Diagnoses: Knowledge deficit related to ulceration/compromised skin integrity Goals: Patient/caregiver will verbalize understanding of skin care regimen Date Initiated: 03/09/2020 Target Resolution Date: 07/30/2020 Goal Status: Active Ulcer/skin breakdown will have a volume reduction of 30% by week 4 Date Initiated: 03/09/2020 Date Inactivated: 04/16/2020 Target Resolution Date: 05/28/2020 Goal Status: Unmet Unmet Reason: necrotic Ulcer/skin breakdown will have a volume reduction of 50% by week 8 Date Initiated: 04/16/2020 Date Inactivated: 05/17/2020 Target Resolution Date: 05/14/2020 Goal Status: Met Interventions: Assess patient/caregiver ability to obtain necessary supplies Assess  patient/caregiver ability to perform ulcer/skin care regimen upon admission and as needed Assess ulceration(s) every visit Notes: Electronic Signature(s) Signed: 07/16/2020 4:31:51 PM By: Baruch Gouty RN, BSN Entered By: Baruch Gouty on 07/16/2020 08:56:35 -------------------------------------------------------------------------------- Pain Assessment Details Patient Name: Date of Service: [name], [name] 07/16/2020 8:45 A M Medical Record Number: [id number] Patient Account Number: [id number] Date of Birth/Sex: Treating RN: Aug 06, 1945 (75 y.o. [name] Primary Care Bosco Paparella: [name] Other Clinician: Referring Finnian Husted: Treating Vladimir Lenhoff/Extender: [name] in Treatment: 18 Active Problems Location of Pain Severity and Description of Pain Patient Has Paino No Patient Has Paino No Site Locations Rate the pain. Current Pain Level: 0 Pain Management and Medication Current Pain Management: Medication: No Cold Application: No Rest: No Massage: No Activity: No T.E.N.S.: No Heat Application: No Leg drop or elevation: No Is the Current Pain Management Adequate: Adequate How does your wound impact your activities of daily livingo Sleep: No Bathing: No Appetite: No Relationship With Others: No Bladder Continence: No Emotions: No Bowel Continence: No Work: No Toileting: No Drive: No Dressing: No Hobbies: No Electronic Signature(s) Signed: 07/16/2020 4:31:03 PM By: [name] Entered By: [name] on 07/16/2020 08:31:52 -------------------------------------------------------------------------------- Patient/Caregiver Education Details Patient Name: Date of Service: [name]. 1/21/2022andnbsp8:45 A M Medical Record Number: [id number] Patient Account Number:  [id number] Date of Birth/Gender: Treating RN: 1945/07/13 (75 y.o. [name] Primary Care Physician: [name] Other Clinician: Referring  Physician: Treating Physician/Extender: [name] in Treatment: 18 Education Assessment Education Provided To: Patient Education Topics Provided Venous: Methods: Explain/Verbal Responses: Reinforcements needed, State content correctly Electronic Signature(s) Signed: 07/16/2020 4:31:51 PM By: Baruch Gouty RN, BSN Entered By: Baruch Gouty on 07/16/2020 08:56:50 -------------------------------------------------------------------------------- Wound Assessment Details Patient Name: Date of Service: [name], [name] 07/16/2020 8:45 A M Medical Record Number: [id number] Patient Account Number: [id number] Date of Birth/Sex: Treating RN: [birth year] (75 y.o. Lorette Ang, Meta.Reding Primary Care Juline Sanderford: [name] Other Clinician: Referring Chea Malan: Treating Lawyer Washabaugh/Extender: [name] in Treatment: 18 Wound Status Wound Number: 1 Primary Dehisced Wound Etiology: Wound Location: Left, Lateral Foot Wound Open Wounding Event: Gradually Appeared Status: Date Acquired: 02/03/2019 Comorbid Cataracts, Glaucoma, Anemia, Deep Vein Thrombosis, Weeks Of Treatment: 18 History: Hypertension, Peripheral Venous Disease, Osteoarthritis Clustered Wound: No Wound Measurements Length: (cm) 1 Width: (cm) 0.3 Depth: (cm) 0.3 Area: (cm) 0.236 Volume: (cm) 0.071 % Reduction in Area: 88.9% % Reduction in Volume: 88.8% Epithelialization: Large (67-100%) Tunneling: No Undermining: No Wound Description Classification: Full Thickness Without Exposed Support Structures Wound Margin: Distinct, outline attached Exudate Amount: Medium Exudate Type: Serosanguineous Exudate Color: red, brown Foul Odor After Cleansing: No Slough/Fibrino Yes Wound Bed Granulation Amount: None Present (0%) Exposed Structure Necrotic Amount: Small (1-33%) Fascia Exposed: No Necrotic Quality: Adherent Slough Fat Layer (Subcutaneous Tissue) Exposed:  Yes Tendon Exposed: No Muscle Exposed: No Joint Exposed: No Bone Exposed: No Treatment Notes Wound #1 (Foot) Wound Laterality: Left, Lateral Cleanser Peri-Wound Care Sween Lotion (Moisturizing lotion) Discharge Instruction: Apply moisturizing lotion as directed Topical Primary Dressing Endoform 2x2 in Discharge Instruction: Moisten with saline Secondary Dressing Woven Gauze Sponge, Non-Sterile 4x4 in Discharge Instruction: Apply over primary dressing as directed. Drawtex 4x4 in Discharge Instruction: Apply over primary dressing cut to fit inside wound margins and fill hole Secured With Compression Wrap ThreePress (3 layer compression wrap) Discharge Instruction: Apply three layer compression as directed. Compression Stockings Carolon Multi Layer Compression Stocking Quantity: 1 Left Leg Compression Amount: 30-40 mmHg Right Leg Compression Amount: 30-40 mmHg Discharge Instruction: Apply compression stocking daily as instructed. Apply first thing in the morning, remove at night before bed. Add-Ons Electronic Signature(s) Signed: 07/16/2020 4:31:03 PM By: [name] Entered By: [name] on 07/16/2020 08:32:25 -------------------------------------------------------------------------------- Vitals Details Patient Name: Date of Service: [name], [name] 07/16/2020 8:45 A M Medical Record Number: [id number] Patient Account Number: [id number] Date of Birth/Sex: Treating RN: 01-06-1946 (75 y.o. Lorette Ang, Meta.Reding Primary Care Darrian Grzelak: [name] Other Clinician: Referring Brylan Seubert: Treating Nekisha Mcdiarmid/Extender: [name] in Treatment: 18 Vital Signs Time Taken: 08:20 Temperature (F): 98.5 Height (in): 74 Pulse (bpm): 78 Weight (lbs): 260 Respiratory Rate (breaths/min): 18 Body Mass Index (BMI): 33.4 Blood Pressure (mmHg): 132/80 Reference Range: 80 - 120 mg / dl Electronic Signature(s) Signed: 07/16/2020 4:31:03 PM By: [name] Entered By: [name] on 07/16/2020 08:31:38

## 2020-07-19 ENCOUNTER — Ambulatory Visit (HOSPITAL_COMMUNITY): Payer: Medicare Other

## 2020-07-21 ENCOUNTER — Other Ambulatory Visit: Payer: Self-pay

## 2020-07-21 ENCOUNTER — Ambulatory Visit (INDEPENDENT_AMBULATORY_CARE_PROVIDER_SITE_OTHER): Payer: Medicare Other | Admitting: Internal Medicine

## 2020-07-21 VITALS — BP 133/50 | HR 67 | Temp 98.1°F | Ht 74.0 in | Wt 239.0 lb

## 2020-07-21 DIAGNOSIS — R0609 Other forms of dyspnea: Secondary | ICD-10-CM

## 2020-07-21 DIAGNOSIS — I10 Essential (primary) hypertension: Secondary | ICD-10-CM

## 2020-07-21 DIAGNOSIS — D500 Iron deficiency anemia secondary to blood loss (chronic): Secondary | ICD-10-CM

## 2020-07-21 DIAGNOSIS — R0600 Dyspnea, unspecified: Secondary | ICD-10-CM | POA: Diagnosis not present

## 2020-07-21 DIAGNOSIS — M1711 Unilateral primary osteoarthritis, right knee: Secondary | ICD-10-CM

## 2020-07-21 DIAGNOSIS — I5032 Chronic diastolic (congestive) heart failure: Secondary | ICD-10-CM | POA: Diagnosis not present

## 2020-07-21 DIAGNOSIS — M48062 Spinal stenosis, lumbar region with neurogenic claudication: Secondary | ICD-10-CM

## 2020-07-21 DIAGNOSIS — Z720 Tobacco use: Secondary | ICD-10-CM

## 2020-07-21 DIAGNOSIS — E8770 Fluid overload, unspecified: Secondary | ICD-10-CM

## 2020-07-21 DIAGNOSIS — K220 Achalasia of cardia: Secondary | ICD-10-CM

## 2020-07-21 LAB — BASIC METABOLIC PANEL
Anion gap: 12 (ref 5–15)
BUN: 18 mg/dL (ref 8–23)
CO2: 29 mmol/L (ref 22–32)
Calcium: 9.6 mg/dL (ref 8.9–10.3)
Chloride: 96 mmol/L — ABNORMAL LOW (ref 98–111)
Creatinine, Ser: 1.7 mg/dL — ABNORMAL HIGH (ref 0.61–1.24)
GFR, Estimated: 41 mL/min — ABNORMAL LOW (ref >60)
Glucose, Bld: 154 mg/dL — ABNORMAL HIGH (ref 70–99)
Potassium: 3.3 mmol/L — ABNORMAL LOW (ref 3.5–5.1)
Sodium: 137 mmol/L (ref 135–145)

## 2020-07-21 LAB — BRAIN NATRIURETIC PEPTIDE: B Natriuretic Peptide: 633.9 pg/mL — ABNORMAL HIGH (ref 0.0–100.0)

## 2020-07-21 MED ORDER — FUROSEMIDE 40 MG PO TABS: 40.0000 mg | ORAL_TABLET | Freq: Every day | ORAL | 11 refills | Status: AC

## 2020-07-21 MED ORDER — GABAPENTIN 300 MG PO CAPS: 600.0000 mg | ORAL_CAPSULE | Freq: Three times a day (TID) | ORAL | 3 refills | Status: AC

## 2020-07-21 MED ORDER — POLYSACCHARIDE IRON COMPLEX 150 MG PO CAPS: 150.0000 mg | ORAL_CAPSULE | Freq: Every day | ORAL | 11 refills | Status: AC

## 2020-07-21 MED ORDER — OXYCODONE-ACETAMINOPHEN 10-325 MG PO TABS: 1.0000 | ORAL_TABLET | Freq: Three times a day (TID) | ORAL | 0 refills | Status: AC | PRN

## 2020-07-21 MED ORDER — PANTOPRAZOLE SODIUM 40 MG PO TBEC: 40.0000 mg | DELAYED_RELEASE_TABLET | Freq: Two times a day (BID) | ORAL | 0 refills | Status: AC

## 2020-07-21 MED ORDER — VERAPAMIL HCL ER 180 MG PO TBCR: 180.0000 mg | EXTENDED_RELEASE_TABLET | Freq: Every day | ORAL | 3 refills | Status: AC

## 2020-07-21 NOTE — Progress Notes (Signed)
CC: Heart Failure  HPI:  [name] is a 75 y.o. male with a past medical history stated below and presents today for HFpEF. Please see problem based assessment and plan for additional details.  Past Medical History:  Diagnosis Date  . 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  Excellent symptomatic response to LES Botox injections.   . Acute GI bleeding 08/21/2018  . Anemia 08/20/2018  . Asteatotic eczema 09/21/2011   Uses Eucerin cream daily   . AVM (arteriovenous malformation) of colon 08/26/2013   Cecum X 3, without bleeding on colonoscopy February 2015   . B12 deficiency 10/01/2011   Discovered to 2 progressive neurologic pain and dysfunction.  Diagnosis established March 2013.  Requires the following treatment: B12 IM monthly until level normalizes. After that, oral supplementation.   . Cataract   . Chronic venous insufficiency 11/28/2013  . Constipation due to opioid therapy 03/11/2015  . Deep venous thrombosis ([place]) 05/31/2018   Right lower extremity, unprovoked  . Diverticulosis 08/26/2013  . Essential hypertension 06/15/2006  . Family history of malignant neoplasm of gastrointestinal tract 06/30/2013   Father with colon cancer age 54   . Gastroesophageal reflux disease 02/04/2007  . GI bleeding 08/21/2018  . Internal hemorrhoids 08/26/2013  . Left posterior subcapsular cataract 10/03/2013   s/p yag laser capsulotomy 10/03/2013   . 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  Complicated by chronic low back pain and neuropathy.  Nerve conduction study (10/19/11): Diffusely low motor amplitudes, with primarily involvement of the peroneal and posterior tibial nerves. No evidence of generalized peripheral neuropathy. Findings could be c/w bil  multilevel lumbosacral radiculopathies or a primary motor neuropathy.   . Mild aortic valve stenosis 10/09/2006   Echo (12/09/2009): Valve area: 2.03 cm2   . Neuromuscular disorder ([place])   . Neuropathy   . Obesity, Class I, BMI 30-34.9 04/05/2012  . Onychomycosis of toenail 06/05/2014  . Osteoarthritis 11/25/2009   Right knee (medial compartment), right hip, left shoulder   . Peripheral neuropathy   . 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 Trigger little finger of right hand 03/11/2015    Current Outpatient Medications on File Prior to Visit  Medication Sig Dispense Refill  . collagenase (SANTYL) ointment Apply 1 application topically daily. 15 g 0  . COMBIGAN 0.2-0.5 % ophthalmic solution Place 1 drop into both eyes 2 (two) times daily.    [name] LUMIGAN 0.01 % SOLN     . sorbitol 70 % solution Take 15 mLs by mouth daily as needed. (Patient taking differently: Take 15 mLs by mouth daily as needed (for constipation). ) 3840 mL 3  . vitamin B-12 (CYANOCOBALAMIN) 1000 MCG tablet Take 1 tablet (1,000 mcg total) by mouth daily. 90 tablet 3   No current facility-administered medications on file prior to visit.    Family History  Problem Relation Age of Onset  . Heart disease Mother   . Colon cancer Father 66  . Kidney disease Brother        on dialysis  . Pancreatic cancer Brother   . Hypertension Sister   . Diabetes Sister   . Kidney disease Brother        on dialysis  . Diabetes Sister   .  Colon cancer Brother        2 brothers with colon cancer  . Cancer Daughter 76       Died at age of 79, unknown type  . Anesthesia problems Neg Hx     Social History   Socioeconomic History  . Marital status: Single    Spouse name: Not on file  . Number of children: Not on file  . Years of education: Not on file  . Highest education level: Not on file  Occupational History  . Not on file  Tobacco Use  . Smoking status: Current Some Day  Smoker    Packs/day: 0.20    Years: 30.00    Pack years: 6.00    Types: Cigarettes    Last attempt to quit: 11/05/2010    Years since quitting: 9.7  . Smokeless tobacco: Never Used  Vaping Use  . Vaping Use: Never used  Substance and Sexual Activity  . Alcohol use: Yes    Alcohol/week: 0.0 standard drinks    Comment: sometimes.  . Drug use: No  . Sexual activity: Never    Comment: quit in 2014  Other Topics Concern  . Not on file  Social History Narrative  . Not on file   Social Determinants of Health   Financial Resource Strain: Not on file  Food Insecurity: Not on file  Transportation Needs: Not on file  Physical Activity: Not on file  Stress: Not on file  Social Connections: Not on file  Intimate Partner Violence: Not on file    Review of Systems: ROS negative except for what is noted on the assessment and plan.  Vitals:   07/21/20 1321  BP: (!) 133/50  Pulse: 67  Temp: 98.1 F (36.7 C)  TempSrc: Oral  SpO2: 97%  Weight: 239 lb (108.4 kg)  Height: 6\' 2"  (1.88 m)     Physical Exam: Physical Exam Constitutional:      Appearance: Normal appearance. He is obese.  Neck:     Vascular: No JVD.  Cardiovascular:     Rate and Rhythm: Normal rate and regular rhythm.     Pulses: Normal pulses.     Heart sounds: Murmur heard.    Pulmonary:     Effort: Pulmonary effort is normal.     Breath sounds: Normal breath sounds. No wheezing or rales.  Abdominal:     General: There is no distension.     Palpations: Abdomen is soft.     Tenderness: There is no abdominal tenderness.  Musculoskeletal:        General: Normal range of motion.     Comments: Trace edema in bilateral lower extremities  Skin:    General: Skin is warm and dry.  Neurological:     Mental Status: He is alert. Mental status is at baseline.  Psychiatric:        Mood and Affect: Mood normal.      Assessment & Plan:   See Encounters Tab for problem based charting.  Patient discussed with  Dr. Kelle Darting, D.O. [facility], PGY-2 Pager: [phone], Phone: [phone] Date 07/22/2020 Time 9:57 AM

## 2020-07-21 NOTE — Patient Instructions (Signed)
Thank you, [name] for allowing [place] to provide your care today. Today we discussed Shortness of breath and heart failure.    I have ordered the following labs for you:   Lab Orders     Brain natriuretic peptide     BMP w Anion Gap (STAT/Sunquest-performed on-site)   Tests ordered today:  None ordered today, but please schedule your CT scan of your chest  Referrals ordered today:   Referral Orders  No referral(s) requested today     I have ordered the following medication/changed the following medications:   Stop the following medications: There are no discontinued medications.   Start the following medications: No orders of the defined types were placed in this encounter.    Follow up: 3 months with PCP   Remember: I will call you if there are any medication changes.   Should you have any questions or concerns please call the internal medicine clinic at [phone].     Marianna Payment, D.O. [place]

## 2020-07-22 ENCOUNTER — Encounter (HOSPITAL_BASED_OUTPATIENT_CLINIC_OR_DEPARTMENT_OTHER): Payer: Medicare Other | Admitting: Internal Medicine

## 2020-07-22 ENCOUNTER — Encounter: Payer: Self-pay | Admitting: Internal Medicine

## 2020-07-22 ENCOUNTER — Telehealth: Payer: Self-pay | Admitting: Internal Medicine

## 2020-07-22 DIAGNOSIS — I5032 Chronic diastolic (congestive) heart failure: Secondary | ICD-10-CM

## 2020-07-22 DIAGNOSIS — N179 Acute kidney failure, unspecified: Secondary | ICD-10-CM

## 2020-07-22 NOTE — Assessment & Plan Note (Signed)
Refilled verapamil today.

## 2020-07-22 NOTE — Telephone Encounter (Signed)
Spoke with the pt's niece.  Lab appointment has been sch for Monday 07/26/2020 @ 11 am to f/u.

## 2020-07-22 NOTE — Assessment & Plan Note (Signed)
Reminded patient to schedule an appointment for his CT scan.

## 2020-07-22 NOTE — Telephone Encounter (Signed)
Thank you :)

## 2020-07-22 NOTE — Telephone Encounter (Signed)
Called [name] last night (01/26) to discuss his recent lab results and give further instructions on how to take his lasix. I was not able to reach him so I left a voice mail with my instructions as we discussed during his office visit.   I try to call him again this morning to confirm that he received my message without an answer. I spoke with his niece, [name]. I told her to have [name] hold his lasix for today and tomorrow, then reduce his dose to 20 mg daily for Saturday and Sunday. I would like him to follow up with our clinic on Monday to repeat his BMP to make sure his AKI has resolved.   I will send a message to the front desk staff to arrange a follow up appointment for Monday 07/26/20.   Lawerance Cruel, D.O.  Internal Medicine Resident, PGY-2 [facility]  Pager: [phone] 7:31 AM, 07/22/2020

## 2020-07-22 NOTE — Assessment & Plan Note (Addendum)
Assessment: Patient presents today for reevaluation of his heart failure. Patient admits to the following symptoms baseline dyspnea with exertion (able to climb a flight of stairs before resting). He endorses these symptoms over the last 1 month but states it ahs improved since his last visit. His symptoms occur intermittently, and he characterizes them as at baseline. The symptoms are aggravated by exertion and relieved by rest. Patient denies associated chest pain, claudication, exertional chest pressure/discomfort, irregular heart beat, near-syncope, orthopnea, paroxysmal nocturnal dyspnea and syncope.  Current Treatment includes: 1. Furosemide 40 mg daily   Patient states that he has been adherent with his medications as prescribed. He states that he isn't urinating as much as he was a month ago, but states that he is urinating without symptoms.    Weights:        Dry weight: ~ 255 lbs        06/16/20: 264 lbs        07/21/20: 239 lbs   Echo: EF: 60 to 65%. no regional wall motion abnormalities. moderate left ventricular hypertrophy. Grade I diastolic dysfunction. Left atrial size was mildly dilated. Moderate calcification of the Aortic valve. Moderate thickening of the aortic valve w/ mild to moderate stenosis. Mild to moderate aortic regurgitation. Unchanged from prior.   Considering his significant weight loss, I am concerned that he may be over diuresised. I am glad that his shortness of breath has returned to baseline.   Plan: 1. Stat BMP to assess kidney funciton and electrolytes 2. Stat BNP to compare it to his previous levels during CHF exacerbation.    **Addedum**  Patient's BMP shows AKI with mild hypokalemia likely secondary to over diuresis. Patient instructed to hold lasix for 2 days and then reduce dose to 20 mg and follow up on Monday for repeat BMP.  BMP Latest Ref Rng & Units 07/21/2020 05/18/2020 03/24/2020  Glucose 70 - 99 mg/dL 154(H) 87 124(H)  BUN 8 - 23 mg/dL 18  16 16   Creatinine 0.61 - 1.24 mg/dL 1.70(H) 1.27(H) 1.21  BUN/Creat Ratio 10 - 24 - - 13  Sodium 135 - 145 mmol/L 137 139 141  Potassium 3.5 - 5.1 mmol/L 3.3(L) 3.6 3.5  Chloride 98 - 111 mmol/L 96(L) 101 98  CO2 22 - 32 mmol/L 29 25 27   Calcium 8.9 - 10.3 mg/dL 9.6 9.7 9.7    BNP (last 3 results) Recent Labs    03/17/20 1128 05/18/20 1530 07/21/20 1352  BNP 914.9* 730.5* 633.9*

## 2020-07-23 ENCOUNTER — Other Ambulatory Visit: Payer: Self-pay

## 2020-07-23 ENCOUNTER — Encounter (HOSPITAL_BASED_OUTPATIENT_CLINIC_OR_DEPARTMENT_OTHER): Payer: Medicare Other | Admitting: Internal Medicine

## 2020-07-23 DIAGNOSIS — T8131XD Disruption of external operation (surgical) wound, not elsewhere classified, subsequent encounter: Secondary | ICD-10-CM | POA: Diagnosis not present

## 2020-07-23 NOTE — Progress Notes (Signed)
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 Diagnosis Description L97.522 Non-pressure chronic ulcer of other part of left foot with fat layer exposed Modifier: Quantity: 1 Physician Procedures : CPT4 Code Description Modifier [id number] - WC PHYS SUBQ TISS 20 SQ CM ICD-10 Diagnosis Description L97.522 Non-pressure chronic ulcer of other part of left foot with fat layer exposed Quantity: 1 Electronic Signature(s) Signed: 07/23/2020 5:33:44 PM By: Linton Ham MD Entered By: Linton Ham on 07/23/2020 15:35:41

## 2020-07-26 ENCOUNTER — Other Ambulatory Visit (INDEPENDENT_AMBULATORY_CARE_PROVIDER_SITE_OTHER): Payer: Medicare Other

## 2020-07-26 DIAGNOSIS — I5032 Chronic diastolic (congestive) heart failure: Secondary | ICD-10-CM

## 2020-07-26 DIAGNOSIS — N179 Acute kidney failure, unspecified: Secondary | ICD-10-CM | POA: Diagnosis not present

## 2020-07-26 NOTE — Progress Notes (Addendum)
AIVAN, VANFOSSAN ([id number]) Visit Report for 07/23/2020 Arrival Information Details Patient Name: Date of Service: [name] 07/23/2020 2:15 PM Medical Record Number: [id number] Patient Account Number: [id number] Date of Birth/Sex: Treating RN: [birth year] (75 y.o. [name] Primary Care [name]: [name] Other Clinician: Referring [name]: Treating [name]/Extender: [name] in Treatment: 46 Visit Information History Since Last Visit Added or deleted any medications: No Patient Arrived: Wheel Chair Any new allergies or adverse reactions: No Arrival Time: 14:33 Had a fall or experienced change in No Accompanied By: self activities of daily living that may affect Transfer Assistance: None risk of falls: Patient Identification Verified: Yes Signs or symptoms of abuse/neglect since last visito No Secondary Verification Process Completed: Yes Hospitalized since last visit: No Patient Requires Transmission-Based Precautions: No Implantable device outside of the clinic excluding No Patient Has Alerts: No cellular tissue based products placed in the center since last visit: Has Dressing in Place as Prescribed: Yes Pain Present Now: No Electronic Signature(s) Signed: 07/26/2020 11:41:01 AM By: [name] Entered By: [name] on 07/23/2020 14:36:00 -------------------------------------------------------------------------------- Complex / Palliative Patient Assessment Details Patient Name: Date of Service: [name], Shawn BERT D. 07/23/2020 2:15 PM Medical Record Number: [id number] Patient Account Number: [id number] Date of Birth/Sex: Treating RN: [birth year] (75 y.o. [name] Primary Care [name]: [name] Other Clinician: Referring [name]: Treating [name]/Extender: [name] in Treatment: 19 Palliative Management Criteria Complex Wound Management Criteria Patient has  remarkable or complex co-morbidities requiring medications or treatments that extend wound healing times. Examples: Diabetes mellitus with chronic renal failure or end stage renal disease requiring dialysis Advanced or poorly controlled rheumatoid arthritis Diabetes mellitus and end stage chronic obstructive pulmonary disease Active cancer with current chemo- or radiation therapy PVD, CHF, Aortic Stenosis Care Approach Wound Care Plan: Complex Wound Management Electronic Signature(s) Signed: 07/30/2020 4:22:00 PM By: Shawn Ham MD Signed: 08/03/2020 5:05:26 PM By: Shawn Hurst RN, BSN Entered By: [name] on 07/30/2020 10:09:25 -------------------------------------------------------------------------------- Compression Therapy Details Patient Name: Date of Service: [name], Shawn BERT D. 07/23/2020 2:15 PM Medical Record Number: [id number] Patient Account Number: [id number] Date of Birth/Sex: Treating RN: [birth year] (75 y.o. [name] Primary Care [name]: [name] Other Clinician: Referring [name]: Treating [name]/Extender: [name] in Treatment: 19 Compression Therapy Performed for Wound Assessment: Wound #1 Left,Lateral Foot Performed By: Clinician Shawn Pilling, RN Compression Type: Three Layer Post Procedure Diagnosis Same as Pre-procedure Electronic Signature(s) Signed: 07/23/2020 5:52:16 PM By: Baruch Gouty RN, BSN Entered By: Baruch Gouty on 07/23/2020 15:17:26 -------------------------------------------------------------------------------- Encounter Discharge Information Details Patient Name: Date of Service: [name], Shawn BERT D. 07/23/2020 2:15 PM Medical Record Number: [id number] Patient Account Number: [id number] Date of Birth/Sex: Treating RN: 03-24-1946 (75 y.o. [name] Primary Care [name]: [name] Other Clinician: Referring [name]: Treating [name]/Extender: [name] in Treatment: 19 Encounter Discharge Information Items Post Procedure Vitals Discharge Condition: Stable Temperature (F): 98.4 Ambulatory Status: Walker Pulse (bpm): 50 Discharge Destination: Home Respiratory Rate (breaths/min): 18 Transportation: Private Auto Blood Pressure (mmHg): 127/68 Accompanied By: self Schedule Follow-up Appointment: Yes Clinical Summary of Care: Electronic Signature(s) Signed: 07/23/2020 5:42:38 PM By: [name] Entered By: [name] on 07/23/2020 16:37:01 -------------------------------------------------------------------------------- Lower Extremity Assessment Details Patient Name: Date of Service: [name], Shawn BERT D. 07/23/2020 2:15 PM Medical Record Number: [id number] Patient Account Number: [id number] Date of Birth/Sex: Treating RN: 11-01-1945 (75 y.o. [name] Primary  Care [name]: [name] Other Clinician: Referring [name]: Treating [name]/Extender: [name] in Treatment: 19 Edema Assessment Assessed: [name]: No] [name]: No] Edema: [Left: N] [Right: o] Calf Left: Right: Point of Measurement: From Medial Instep 36 cm Ankle Left: Right: Point of Measurement: From Medial Instep 25 cm Vascular Assessment Pulses: Dorsalis Pedis Palpable: [Left:Yes] Electronic Signature(s) Signed: 07/23/2020 5:52:57 PM By: Shawn Hurst RN, BSN Entered By: [name] on 07/23/2020 14:50:02 -------------------------------------------------------------------------------- Multi Wound Chart Details Patient Name: Date of Service: [name], Shawn BERT D. 07/23/2020 2:15 PM Medical Record Number: [id number] Patient Account Number: [id number] Date of Birth/Sex: Treating RN: [birth year] (75 y.o. [name] Primary Care Lashayla Armes: [name] Other Clinician: Referring [name]: Treating [name]/Extender: [name] in Treatment: 19 Vital Signs Height(in):  74 Pulse(bpm): 50 Weight(lbs): 260 Blood Pressure(mmHg): 127/68 Body Mass Index(BMI): 33 Temperature(F): 98.4 Respiratory Rate(breaths/min): 18 Photos: [1:No Photos Left, Lateral Foot] [N/A:N/A N/A] Wound Location: [1:Gradually Appeared] [N/A:N/A] Wounding Event: [1:Dehisced Wound] [N/A:N/A] Primary Etiology: [1:Cataracts, Glaucoma, Anemia, Deep N/A] Comorbid History: [1:Vein Thrombosis, Hypertension, Peripheral Venous Disease, Osteoarthritis 02/03/2019] [N/A:N/A] Date Acquired: [1:19] [N/A:N/A] Weeks of Treatment: [1:Open] [N/A:N/A] Wound Status: [1:1x0.5x0.3] [N/A:N/A] Measurements L x W x D (cm) [1:0.393] [N/A:N/A] A (cm) : rea [1:0.118] [N/A:N/A] Volume (cm) : [1:81.50%] [N/A:N/A] % Reduction in Area: [1:81.40%] [N/A:N/A] % Reduction in Volume: [1:Full Thickness Without Exposed] [N/A:N/A] Classification: [1:Support Structures Medium] [N/A:N/A] Exudate Amount: [1:Serous] [N/A:N/A] Exudate Type: [1:amber] [N/A:N/A] Exudate Color: [1:Distinct, outline attached] [N/A:N/A] Wound Margin: [1:None Present (0%)] [N/A:N/A] Granulation Amount: [1:Large (67-100%)] [N/A:N/A] Necrotic Amount: [1:Fat Layer (Subcutaneous Tissue): Yes N/A] Exposed Structures: [1:Fascia: No Tendon: No Muscle: No Joint: No Bone: No Large (67-100%)] [N/A:N/A] Epithelialization: [1:Debridement - Excisional] [N/A:N/A] Debridement: Pre-procedure Verification/Time Out 15:14 [N/A:N/A] Taken: [1:Subcutaneous, Slough] [N/A:N/A] Tissue Debrided: [1:Skin/Subcutaneous Tissue] [N/A:N/A] Level: [1:0.5] [N/A:N/A] Debridement A (sq cm): [1:rea Curette] [N/A:N/A] Instrument: [1:Minimum] [N/A:N/A] Bleeding: [1:Pressure] [N/A:N/A] Hemostasis A chieved: [1:0] [N/A:N/A] Procedural Pain: [1:0] [N/A:N/A] Post Procedural Pain: [1:Procedure was tolerated well] [N/A:N/A] Debridement Treatment Response: [1:1x0.5x0.3] [N/A:N/A] Post Debridement Measurements L x W x D (cm) [1:0.118] [N/A:N/A] Post Debridement Volume: (cm)  [1:Compression Therapy] [N/A:N/A] Procedures Performed: [1:Debridement] Treatment Notes Electronic Signature(s) Signed: 07/23/2020 5:33:44 PM By: Shawn Ham MD Signed: 07/23/2020 5:52:16 PM By: Baruch Gouty RN, BSN Entered By: [name] on 07/23/2020 15:31:09 -------------------------------------------------------------------------------- Multi-Disciplinary Care Plan Details Patient Name: Date of Service: [name], Tobey Bride D. 07/23/2020 2:15 PM Medical Record Number: [id number] Patient Account Number: [id number] Date of Birth/Sex: Treating RN: 05/25/1946 (75 y.o. [name] Primary Care Prakash Kimberling: [name] Other Clinician: Referring Kaydee Magel: Treating Deberah Adolf/Extender: [name] in Treatment: 19 Active Inactive Electronic Signature(s) Signed: 08/26/2020 10:42:12 AM By: Baruch Gouty RN, BSN Previous Signature: 07/23/2020 5:52:16 PM Version By: Baruch Gouty RN, BSN Entered By: Baruch Gouty on 08/05/2020 16:38:22 -------------------------------------------------------------------------------- Pain Assessment Details Patient Name: Date of Service: [name], Shawn BERT D. 07/23/2020 2:15 PM Medical Record Number: [id number] Patient Account Number: [id number] Date of Birth/Sex: Treating RN: [birth year] (75 y.o. [name] Primary Care Taffany Heiser: [name] Other Clinician: Referring Misheel Gowans: Treating Caelie Remsburg/Extender: [name] in Treatment: 19 Active Problems Location of Pain Severity and Description of Pain Patient Has Paino No Patient Has Paino No Site Locations Pain Management and Medication Current Pain Management: Electronic Signature(s) Signed: 07/23/2020 5:52:16 PM By: Baruch Gouty RN, BSN Signed: 07/26/2020 11:41:01 AM By: [name] Entered By: [name] on 07/23/2020  14:36:31 -------------------------------------------------------------------------------- Patient/Caregiver Education Details Patient Name:  Date of Service: [name] 1/28/2022andnbsp2:15 PM Medical Record Number: [id number] Patient Account Number: [id number] Date of Birth/Gender: Treating RN: [birth year] (75 y.o. [name] Primary Care Physician: [name] Other Clinician: Referring Physician: Treating Physician/Extender: [name] in Treatment: 19 Education Assessment Education Provided To: Patient Education Topics Provided Venous: Methods: Explain/Verbal Responses: Reinforcements needed, State content correctly Wound/Skin Impairment: Methods: Explain/Verbal Responses: Reinforcements needed, State content correctly Electronic Signature(s) Signed: 07/23/2020 5:52:16 PM By: Baruch Gouty RN, BSN Entered By: Baruch Gouty on 07/23/2020 15:08:24 -------------------------------------------------------------------------------- Wound Assessment Details Patient Name: Date of Service: [name], Shawn BERT D. 07/23/2020 2:15 PM Medical Record Number: [id number] Patient Account Number: [id number] Date of Birth/Sex: Treating RN: June 24, 1946 (75 y.o. [name] Primary Care Gracin Soohoo: [name] Other Clinician: Referring Corrinne Benegas: Treating Aryelle Figg/Extender: [name] in Treatment: 19 Wound Status Wound Number: 1 Primary Dehisced Wound Etiology: Wound Location: Left, Lateral Foot Wound Open Wounding Event: Gradually Appeared Status: Date Acquired: 02/03/2019 Comorbid Cataracts, Glaucoma, Anemia, Deep Vein Thrombosis, Weeks Of Treatment: 19 History: Hypertension, Peripheral Venous Disease, Osteoarthritis Clustered Wound: No Photos Photo Uploaded By: Mikeal Hawthorne on 07/28/2020 14:27:48 Wound Measurements Length: (cm) 1 Width: (cm) 0.5 Depth: (cm) 0.3 Area: (cm) 0.393 Volume: (cm)  0.118 % Reduction in Area: 81.5% % Reduction in Volume: 81.4% Epithelialization: Large (67-100%) Undermining: No Wound Description Classification: Full Thickness Without Exposed Support Structures Wound Margin: Distinct, outline attached Exudate Amount: Medium Exudate Type: Serous Exudate Color: amber Foul Odor After Cleansing: No Slough/Fibrino Yes Wound Bed Granulation Amount: None Present (0%) Exposed Structure Necrotic Amount: Large (67-100%) Fascia Exposed: No Necrotic Quality: Adherent Slough Fat Layer (Subcutaneous Tissue) Exposed: Yes Tendon Exposed: No Muscle Exposed: No Joint Exposed: No Bone Exposed: No Electronic Signature(s) Signed: 07/23/2020 5:52:16 PM By: Baruch Gouty RN, BSN Signed: 07/23/2020 5:52:57 PM By: Shawn Hurst RN, BSN Entered By: [name] on 07/23/2020 14:50:15 -------------------------------------------------------------------------------- Graf Details Patient Name: Date of Service: [name], Shawn BERT D. 07/23/2020 2:15 PM Medical Record Number: [id number] Patient Account Number: [id number] Date of Birth/Sex: Treating RN: [birth year] (75 y.o. [name] Primary Care Raylan Hanton: [name] Other Clinician: Referring Torre Schaumburg: Treating Lavora Brisbon/Extender: [name] in Treatment: 19 Vital Signs Time Taken: 14:36 Temperature (F): 98.4 Height (in): 74 Pulse (bpm): 50 Weight (lbs): 260 Respiratory Rate (breaths/min): 18 Body Mass Index (BMI): 33.4 Blood Pressure (mmHg): 127/68 Reference Range: 80 - 120 mg / dl Electronic Signature(s) Signed: 07/26/2020 11:41:01 AM By: [name] Entered By: [name] on 07/23/2020 14:36:26

## 2020-07-26 NOTE — Progress Notes (Signed)
Internal Medicine Clinic Attending  Case discussed with Dr. Coe  At the time of the visit.  We reviewed the resident's history and exam and pertinent patient test results.  I agree with the assessment, diagnosis, and plan of care documented in the resident's note.  

## 2020-07-27 LAB — BMP8+ANION GAP
Anion Gap: 19 mmol/L — ABNORMAL HIGH (ref 10.0–18.0)
BUN/Creatinine Ratio: 12 (ref 10–24)
BUN: 19 mg/dL (ref 8–27)
CO2: 23 mmol/L (ref 20–29)
Calcium: 9.9 mg/dL (ref 8.6–10.2)
Chloride: 95 mmol/L — ABNORMAL LOW (ref 96–106)
Creatinine, Ser: 1.56 mg/dL — ABNORMAL HIGH (ref 0.76–1.27)
GFR calc Af Amer: 49 mL/min/{1.73_m2} — ABNORMAL LOW (ref >59)
GFR calc non Af Amer: 42 mL/min/{1.73_m2} — ABNORMAL LOW (ref >59)
Glucose: 136 mg/dL — ABNORMAL HIGH (ref 65–99)
Potassium: 3.4 mmol/L — ABNORMAL LOW (ref 3.5–5.2)
Sodium: 137 mmol/L (ref 134–144)

## 2020-07-30 ENCOUNTER — Encounter (HOSPITAL_BASED_OUTPATIENT_CLINIC_OR_DEPARTMENT_OTHER): Payer: Medicare Other | Attending: Internal Medicine | Admitting: Internal Medicine

## 2020-08-01 ENCOUNTER — Telehealth: Payer: Self-pay | Admitting: Internal Medicine

## 2020-08-01 NOTE — Telephone Encounter (Signed)
I call patient this morning to check on him since lowering his diuretic therapy to 20 mg daily. Patient was previously seen in the office for a follow up for his shortness of breath and was found to be over diuresed with an AKI and mild hypokalemia a this IV on 1/27. At that appointment he was instructed to stop his diuretics for the next three days and recheck his Kidney function on the following Monday (01/31). Patient was started back on 20 mg of lasix daily and his BMP subsequently showed resolving AKI with mild hypokalemia.   I called and spoke with the patient's niece this morning to see how he has been doing on this lower does of diuretic and assess for signs of hypervolemia. The patient's niece states that he had passed away this morning. She states that he has been drinking more over the past few days with his friends and is concerned that this may have contributed to his demise. She also states that he has been complaining of chest pain over the last 2 days. I expressed my condolences to the patient's niece.   Lawerance Cruel, D.O.  Internal Medicine Resident, PGY-2 [facility]  Pager: [phone]

## 2020-08-04 ENCOUNTER — Encounter: Payer: Self-pay | Admitting: *Deleted

## 2020-08-06 ENCOUNTER — Encounter (HOSPITAL_BASED_OUTPATIENT_CLINIC_OR_DEPARTMENT_OTHER): Payer: Medicare Other | Admitting: Internal Medicine

## 2020-08-24 DEATH — deceased

## 2020-08-31 ENCOUNTER — Ambulatory Visit: Payer: Medicare Other | Admitting: Podiatry

## 2020-10-21 ENCOUNTER — Encounter: Payer: Medicare Other | Admitting: Internal Medicine

## 2020-11-11 ENCOUNTER — Encounter: Payer: Medicare Other | Admitting: Internal Medicine

## 2023-07-31 NOTE — Progress Notes (Signed)
 This encounter was created in error - please disregard.
